# Patient Record
Sex: Female | Born: 1935
Health system: Southern US, Community
[De-identification: ages and names within clinical notes are randomized; demographics above are authoritative.]

## PROBLEM LIST (undated history)

## (undated) DIAGNOSIS — I499 Cardiac arrhythmia, unspecified: Secondary | ICD-10-CM

## (undated) DIAGNOSIS — F419 Anxiety disorder, unspecified: Secondary | ICD-10-CM

## (undated) DIAGNOSIS — E039 Hypothyroidism, unspecified: Secondary | ICD-10-CM

## (undated) DIAGNOSIS — I350 Nonrheumatic aortic (valve) stenosis: Secondary | ICD-10-CM

## (undated) DIAGNOSIS — F329 Major depressive disorder, single episode, unspecified: Secondary | ICD-10-CM

## (undated) DIAGNOSIS — F32A Depression, unspecified: Secondary | ICD-10-CM

## (undated) DIAGNOSIS — E785 Hyperlipidemia, unspecified: Secondary | ICD-10-CM

## (undated) DIAGNOSIS — I2510 Atherosclerotic heart disease of native coronary artery without angina pectoris: Secondary | ICD-10-CM

## (undated) DIAGNOSIS — M1990 Unspecified osteoarthritis, unspecified site: Secondary | ICD-10-CM

## (undated) HISTORY — PX: TOTAL HIP ARTHROPLASTY: SHX124

## (undated) HISTORY — PX: OTHER SURGICAL HISTORY: SHX169

## (undated) HISTORY — PX: REPLACEMENT TOTAL KNEE BILATERAL: SUR1225

## (undated) HISTORY — PX: TUBAL LIGATION: SHX77

## (undated) HISTORY — PX: PACEMAKER IMPLANT: EP1218

## (undated) HISTORY — DX: Atherosclerotic heart disease of native coronary artery without angina pectoris: I25.10

## (undated) HISTORY — DX: Nonrheumatic aortic (valve) stenosis: I35.0

---

## 1973-09-27 HISTORY — PX: BUNIONECTOMY: SHX129

## 1996-09-27 HISTORY — PX: TOTAL HIP ARTHROPLASTY: SHX124

## 2001-01-20 ENCOUNTER — Other Ambulatory Visit: Admission: RE | Admit: 2001-01-20 | Discharge: 2001-01-20 | Payer: Self-pay | Admitting: Obstetrics and Gynecology

## 2001-07-26 ENCOUNTER — Other Ambulatory Visit: Admission: RE | Admit: 2001-07-26 | Discharge: 2001-07-26 | Payer: Self-pay | Admitting: Obstetrics and Gynecology

## 2004-01-29 ENCOUNTER — Encounter: Payer: Self-pay | Admitting: Orthopedic Surgery

## 2004-01-29 ENCOUNTER — Ambulatory Visit (HOSPITAL_COMMUNITY): Admission: RE | Admit: 2004-01-29 | Discharge: 2004-01-29 | Payer: Self-pay | Admitting: General Surgery

## 2004-02-04 ENCOUNTER — Encounter: Payer: Self-pay | Admitting: Orthopedic Surgery

## 2007-09-28 HISTORY — PX: EYE SURGERY: SHX253

## 2007-12-21 ENCOUNTER — Ambulatory Visit: Payer: Self-pay | Admitting: Orthopedic Surgery

## 2007-12-21 DIAGNOSIS — M79609 Pain in unspecified limb: Secondary | ICD-10-CM | POA: Insufficient documentation

## 2009-07-03 ENCOUNTER — Ambulatory Visit (HOSPITAL_COMMUNITY): Admission: RE | Admit: 2009-07-03 | Discharge: 2009-07-03 | Payer: Self-pay | Admitting: Pulmonary Disease

## 2009-07-23 ENCOUNTER — Encounter (INDEPENDENT_AMBULATORY_CARE_PROVIDER_SITE_OTHER): Payer: Self-pay | Admitting: Interventional Radiology

## 2009-07-23 ENCOUNTER — Encounter: Admission: RE | Admit: 2009-07-23 | Discharge: 2009-07-23 | Payer: Self-pay | Admitting: Pulmonary Disease

## 2009-07-23 ENCOUNTER — Other Ambulatory Visit: Admission: RE | Admit: 2009-07-23 | Discharge: 2009-07-23 | Payer: Self-pay | Admitting: Interventional Radiology

## 2009-11-25 HISTORY — PX: TOTAL THYROIDECTOMY: SHX2547

## 2009-12-11 ENCOUNTER — Encounter (INDEPENDENT_AMBULATORY_CARE_PROVIDER_SITE_OTHER): Payer: Self-pay | Admitting: Otolaryngology

## 2009-12-11 ENCOUNTER — Ambulatory Visit (HOSPITAL_COMMUNITY): Admission: RE | Admit: 2009-12-11 | Discharge: 2009-12-13 | Payer: Self-pay | Admitting: Otolaryngology

## 2010-03-12 ENCOUNTER — Ambulatory Visit (HOSPITAL_COMMUNITY): Admission: RE | Admit: 2010-03-12 | Discharge: 2010-03-12 | Payer: Self-pay | Admitting: Pulmonary Disease

## 2010-05-13 ENCOUNTER — Encounter: Payer: Self-pay | Admitting: Orthopedic Surgery

## 2010-05-14 ENCOUNTER — Ambulatory Visit: Payer: Self-pay | Admitting: Orthopedic Surgery

## 2010-05-14 DIAGNOSIS — IMO0002 Reserved for concepts with insufficient information to code with codable children: Secondary | ICD-10-CM | POA: Insufficient documentation

## 2010-05-14 DIAGNOSIS — M1710 Unilateral primary osteoarthritis, unspecified knee: Secondary | ICD-10-CM

## 2010-05-19 ENCOUNTER — Encounter (INDEPENDENT_AMBULATORY_CARE_PROVIDER_SITE_OTHER): Payer: Self-pay | Admitting: *Deleted

## 2010-05-21 ENCOUNTER — Encounter (INDEPENDENT_AMBULATORY_CARE_PROVIDER_SITE_OTHER): Payer: Self-pay | Admitting: *Deleted

## 2010-05-28 HISTORY — PX: JOINT REPLACEMENT: SHX530

## 2010-06-02 ENCOUNTER — Ambulatory Visit: Payer: Self-pay | Admitting: Orthopedic Surgery

## 2010-06-02 ENCOUNTER — Inpatient Hospital Stay (HOSPITAL_COMMUNITY): Admission: RE | Admit: 2010-06-02 | Discharge: 2010-06-05 | Payer: Self-pay | Admitting: Orthopedic Surgery

## 2010-06-05 ENCOUNTER — Inpatient Hospital Stay: Admission: AD | Admit: 2010-06-05 | Discharge: 2010-06-19 | Payer: Self-pay | Admitting: Pulmonary Disease

## 2010-06-09 ENCOUNTER — Telehealth: Payer: Self-pay | Admitting: Orthopedic Surgery

## 2010-06-10 ENCOUNTER — Telehealth: Payer: Self-pay | Admitting: Orthopedic Surgery

## 2010-06-10 ENCOUNTER — Ambulatory Visit: Payer: Self-pay | Admitting: Orthopedic Surgery

## 2010-06-10 DIAGNOSIS — Z96659 Presence of unspecified artificial knee joint: Secondary | ICD-10-CM | POA: Insufficient documentation

## 2010-06-16 ENCOUNTER — Ambulatory Visit: Payer: Self-pay | Admitting: Orthopedic Surgery

## 2010-06-19 ENCOUNTER — Telehealth: Payer: Self-pay | Admitting: Orthopedic Surgery

## 2010-07-01 ENCOUNTER — Ambulatory Visit: Payer: Self-pay | Admitting: Orthopedic Surgery

## 2010-08-05 ENCOUNTER — Ambulatory Visit: Payer: Self-pay | Admitting: Orthopedic Surgery

## 2010-09-02 ENCOUNTER — Ambulatory Visit: Payer: Self-pay | Admitting: Orthopedic Surgery

## 2010-10-27 ENCOUNTER — Ambulatory Visit
Admission: RE | Admit: 2010-10-27 | Discharge: 2010-10-27 | Payer: Self-pay | Source: Home / Self Care | Attending: Orthopedic Surgery | Admitting: Orthopedic Surgery

## 2010-10-27 DIAGNOSIS — L0390 Cellulitis, unspecified: Secondary | ICD-10-CM | POA: Insufficient documentation

## 2010-10-27 DIAGNOSIS — L0291 Cutaneous abscess, unspecified: Secondary | ICD-10-CM | POA: Insufficient documentation

## 2010-10-29 NOTE — Assessment & Plan Note (Signed)
Summary: 2 WK RE-CK WOUND/LT TKA POST OP/MEDICARE/CAF   Visit Type:  Follow-up  CC:  POST op tka.  History of Present Illness: I saw [name] in the office today for a followup visit.  She is a 77 years old woman with the complaint of:  left total knee replacement.  DOS 06/02/10 Left total knee, Depuy system used.   Meds: Hydrocodone 7.5/325  takes 3 pills a day.  today we check her wound again she was on Cipro she's been off of it for about 3 days her incision looks good the redness on the lateral side of her leg has all but resolved  She has a PT report which reports her range of motion to be -10-100.  She cannot fully stand up because of her spinal stenosis she maintains a flexed posture in the back this is preventing her from reaching full extension as her other knee is deceased and flexed as well.  She is improving and bleeding with a rolling walker  Followup in one month.     Allergies: No Known Drug Allergies   Impression & Recommendations:  Problem # 1:  TOTAL KNEE FOLLOW-UP (ICD-V43.65) Assessment Comment Only  Orders: Post-Op Check (98119)  Problem # 2:  KNEE, ARTHRITIS, DEGEN./OSTEO (ICD-715.96) Assessment: Comment Only  Orders: Post-Op Check (14782)  Patient Instructions: 1)  Return in 1 month

## 2010-10-29 NOTE — Progress Notes (Signed)
Summary: call from [facility], appt request  Phone Note Call from Patient   Caller: Nursing Home Summary of Call: Rec'd call from [facility], nurse at [facility] @2 :47pm, relays that patient's LT  knee, s/p total knee replacement done 06/02/10,  showing infection.  States redness and 3+ pitting edema.   Advised to come in today, offered appointment for as soon as they can transport patient here today.   Initial call taken by: Cammie Sickle,  June 09, 2010 3:23 PM  Follow-up for Phone Call        Rec'd call back from [facility]. States they're unable to get transportation for patient to come this afternoon. Scheduled for tomorrow. [facility] ph  [phone] - nurse on 2nd shift is [place] if need to call there. Follow-up by: Cammie Sickle,  June 09, 2010 3:28 PM

## 2010-10-29 NOTE — Assessment & Plan Note (Signed)
Summary: 1 MO RECK WOUND/TKA/MCR/BSF   Visit Type:  Follow-up  CC:  post op TKA.  History of Present Illness: I saw [name] in the office today for a followup visit.  She is a 77 years old woman with the complaint of:  left total knee replacement.  DOS 06/02/10 Left total knee, Depuy system used.  medications Syntroid, Lasix, Paxil  Hydrocodone 7.5/325  takes 2 per day.; Cipro, taken for redness around her incision,  She's doing better. Her incision looks great. She is having physical therapy at home with a home exercise program.         Allergies: No Known Drug Allergies   Impression & Recommendations:  Problem # 1:  TOTAL KNEE FOLLOW-UP (ICD-V43.65) Assessment Improved  Orders: Post-Op Check (16109)  Problem # 2:  KNEE, ARTHRITIS, DEGEN./OSTEO (ICD-715.96) Assessment: Improved  Orders: Post-Op Check (60454)  Patient Instructions: 1)  Please schedule a follow-up appointment in 1 month.   Orders Added: 1)  Post-Op Check [09811]

## 2010-10-29 NOTE — Miscellaneous (Signed)
Summary: faxed notes to [facility] for surgery needs  Clinical Lists Changes

## 2010-10-29 NOTE — Progress Notes (Signed)
Summary: patient requests information to daughter about her visit  Phone Note Call from Patient   Caller: Patient Summary of Call: Following her appointment today, patient requests Dr Romeo Apple to relay to daughter, Madigan Rosensteel, what the findings were today with her knee. We have the appropriate auth on file. Ph [phone]. Initial call taken by: Cammie Sickle,  June 10, 2010 2:43 PM

## 2010-10-29 NOTE — Assessment & Plan Note (Signed)
Summary: 1 MTH RECK WOUND/TKA/MCR/CAF   Visit Type:  Follow-up  CC:  recheck knee TKA post op.  History of Present Illness: I saw [name] in the office today for a followup visit.  She is a 77 years old woman with the complaint of:  left total knee replacement.  DOS 06/02/10 Left total knee, Depuy system used.  POD 3 months.  medications Syntroid, Lasix, Paxil, Xanax.  Hydrocodone 7.5/325  takes 2 per day.  Today is a one month recheck after HEP.  ROM is better, doing great, she is doing pretty good. Right now, has a little lateral knee discomfort. Nothing major.  Her incision looks good. Her flexion is past 90 she has almost full extension. She is ambulating with a walker, that RIGHT knee is very arthritic and needs to be done. She wants to do that in June or July         Allergies: No Known Drug Allergies   Impression & Recommendations:  Problem # 1:  TOTAL KNEE FOLLOW-UP (ICD-V43.65) Assessment Improved  Orders: Post-Op Check (78469)  Patient Instructions: 1)  f/u June for left knee film and scheduling TKA left knee    Orders Added: 1)  Post-Op Check [62952]

## 2010-10-29 NOTE — Progress Notes (Signed)
Summary: call from nursing facility patient dischg   Phone Note Other Incoming   Caller: nurse Summary of Call: Clydie Braun, nurse at [facility] [phone], called to relay that patient requests going home today, due to no ins coverage after 20 days, and today is #20.  Dr Juanetta Gosling has okayed discharge.  Nurse is asking about sending patient home w/CPM - she will fax order. Initial call taken by: Cammie Sickle,  June 19, 2010 12:15 PM

## 2010-10-29 NOTE — Letter (Signed)
Summary: History form  History form   Imported By: Jacklynn Ganong 05/20/2010 08:37:28  _____________________________________________________________________  External Attachment:    Type:   Image     Comment:   External Document

## 2010-10-29 NOTE — Miscellaneous (Signed)
Summary: No pre-authorization req'd for in-patient surgery  Clinical Lists Changes  Insurer Medicare - patient is scheduled for in-patient surgery, LT total knee arthroplasty, CPT [phone], 06/02/10 at [facility].  No pre-auth required for Medicare

## 2010-10-29 NOTE — Assessment & Plan Note (Signed)
Summary: POST OP/1 wk RE-CK SKIN,TKA LT KNEE/MEDICARE/CAF   Visit Type:  post op  CC:  left total knee replacement.  History of Present Illness: I saw [name] in the office today for a followup visit.  She is a 77 years old woman with the complaint of:  left total knee replacement.  DOS 06/02/10 Left total knee, Depuy system used.   Meds: Coumadin 2mg , Oxycodone 5/325 1 by mouth q 4 hrs as needed pain, Oxycodone IR 5mg  q 3 hrs as needed pain for breakthrough. Cipro 500 Mg Tabs (Ciprofloxacin hcl) .Marland Kitchen.. 1 by mouth q day x 10 days  Ravinder is improving she said she walked 15 feet today.  Part of her staples have been removed.  She had a little subcutaneous bleeding and possible cellulitis and was placed on Cipro.  She appears to be improving.  Her wound looks good.  The lateral portion of her leg where there was a blister has resolved.  She does have some erythema at the distal portion lateral portion of the leg.  Recommend continue physical therapy finish the Cipro over the next 3 days  Follow up in 2 weeks to check the leg and in the wound.     Allergies: No Known Drug Allergies   Impression & Recommendations:  Problem # 1:  TOTAL KNEE FOLLOW-UP (ICD-V43.65)  Orders: Post-Op Check (45409)  Problem # 2:  KNEE, ARTHRITIS, DEGEN./OSTEO (ICD-715.96)  Orders: Post-Op Check (81191)  Patient Instructions: 1)  return in 2 weeks  2)  check wound

## 2010-10-29 NOTE — Letter (Signed)
Summary: surgery order LT total knee sched 06/02/10  surgery order LT total knee sched 06/02/10   Imported By: Cammie Sickle 05/18/2010 09:30:55  _____________________________________________________________________  External Attachment:    Type:   Image     Comment:   External Document

## 2010-10-29 NOTE — Assessment & Plan Note (Signed)
Summary: LEFT KNEE PAIN NEEDS XR/MEDICARE/BSF   Visit Type:  new problem  CC:  left knee pain.[name]  History of Present Illness: I saw [name] in the office today for a followup visit.  She is a 77 years old woman with the complaint of:  new problem, left knee pain.  Xrays today.  Medications: Synthroid 50 mg, Lasix 40 mg, Xanax .25 mg, Paxil 20 mg.  This is a 77 year old female who has multiple medical problems including hypothyroidism, status post total thyroidectomy, status post LEFT total hip replacement, status post tubal ligation.  She also takes Lasix, Xanax and Paxil and is fairly obese.  She presents with worsening pain over 7-8 years and presents with LEFT greater than RIGHT knee pain which she rates as a 10 out of 10.  When she is at rest she has no pain when she is standing she has significant pain and uses a rolling walker.  She ambulates very poorly with poor exercise tolerance and appears to have a grocery cart sign for lumbar spinal stenosis.  The sharp intermittent pain is worsened and she would like to have both of her knees replaced.   Allergies: No Known Drug Allergies  Past History:  Past Medical History: ostearthritis goiter depression constipation Thyroid  Past Surgical History: Thyroidectomy total hip replacement total reconstruction of left foot 1995 [facility]  Review of Systems Constitutional:  Complains of weight gain; denies weight loss, fever, chills, and fatigue. Cardiovascular:  Denies chest pain, palpitations, fainting, and murmurs. Respiratory:  Denies short of breath, wheezing, couch, tightness, pain on inspiration, and snoring . Gastrointestinal:  Complains of constipation; denies heartburn, nausea, vomiting, diarrhea, and blood in your stools. Genitourinary:  Complains of urgency; denies frequency, difficulty urinating, painful urination, flank pain, and bleeding in urine. Neurologic:  Denies numbness, tingling, unsteady gait,  dizziness, tremors, and seizure. Musculoskeletal:  Complains of joint pain; denies swelling, instability, stiffness, redness, heat, and muscle pain. Endocrine:  Denies excessive thirst, exessive urination, and heat or cold intolerance. Psychiatric:  Complains of depression; denies nervousness, anxiety, and hallucinations. Skin:  Complains of changes in the skin and itching; denies poor healing, rash, and redness. HEENT:  Denies blurred or double vision, eye pain, redness, and watering. Immunology:  Denies seasonal allergies, sinus problems, and allergic to bee stings. Hemoatologic:  Complains of brusing; denies easy bleeding.  Physical Exam  Additional Exam:   * VS reviewed and were normal   *GEN: appearance she is significantly obese.  Her grooming and hygiene are normal shows no deformities other than the knees  ** CDV: normal pulses temperature and no edema  * LYMPH nodes were normal   * SKIN was normal in her extremities  * Neuro: normal sensation in her extremities ** Psyche: AAO x 3 and mood was normal   MSK *Gait as described in history she has a flexed posture she has bilateral flexion contractures of her knees she walks with a significant band in her back bilateral knee she has significant flexion contractures we measured the LEFT knee 25-82 in the RIGHT knee similar.  She had tenderness in both joint lines.  She had deformity.  Motor exam was normal.  Knees were stable.  Upper extremities no deformity, no contracture, no subluxation atrophy or tremor    Impression & Recommendations:  Problem # 1:  KNEE, ARTHRITIS, DEGEN./OSTEO (ICD-715.96) Assessment New x-rays were taken of the LEFT knee and she has complete joint space narrowing in both  joints with varus malalignment severe.  There are multiple osteophytes and severe deformity  Impression severe osteoarthritis LEFT knee   I had a long conversation with this patient and addressed several problems which are of  potentially related to her specific disease process.  She will have to have rehabilitation at a rehabilitation hospital, she is in severe edema in her legs and venous stasis which can lead to blood clots wound problems and ulceration.  She is significantly weak and exercise tolerance is poor and she will have a difficult rehabilitation course.  She also has significant disease in her RIGHT knee and significant stiffness preoperatively in the LEFT knee this can all be to stiffness postoperatively and poor rehabilitation.  She understands these concerns and wishes to proceed with the surgery with the other inherent risks which were also discussed.   Informed consent process: I have discussed the procedure with the patient. I have answered their questions. The risks of bleeding, infection, nerve and vascualr injury have been discussed. The diagnosis and reason for surgery have been explained. The patient demonstrates understanding of this discussion. Specific to this procedure risks include:  Stiffness Pain Infection-can lead to further surgery and amputation  We will go ahead with LEFT total knee arthroplasty  Orders: New Patient Level V (64403) Knee x-ray,  3 views (47425)  Patient Instructions: 1)  DOS 06/02/10 at [facility] 2)  I will call you with your pre[facility], take packet with you 3)  post op 1 in our office on 05/2010 4)  stop aspirin or coumadin 1 week before surgery

## 2010-10-29 NOTE — Assessment & Plan Note (Signed)
Summary: POST OP/?INFECTION/TKA SURG 06/02/10/MEDICARE/CAF   Visit Type:  Follow-up  CC:  post op knee tka.  History of Present Illness: I saw [name] in the office today for a postoperative visit.  She is 77 years old she is at the [facility] center she had a LEFT total knee replacement this is postop a #8  DOS 06/02/10 Left total knee, Depuy system used.     Meds: Coumadin 2mg , Oxycodone 5/325 1 by mouth q 4 hrs as needed pain, Oxycodone IR 5mg  q 3 hrs as needed pain for breakthrough.  Her pain is controlled on the medication.  She was advised to come in yesterday because we received a phone call that her incision looked red and she had some blisters.  Note she had blisters prior to discharge and we released those.    Examination: There are 2 blisters one laterally and one medially there is erythema on the lateral side of the wound.  She does a lot of swelling and subcutaneous ecchymosis her from the thigh to the leg and there is pitting edema.  Recommend stopping Coumadin.  He blisters with wound care.  I took some of the staples out to relieve some of the skin edge tension.  Staples should still come out on the 20th as scheduled.  Followup one week.  Start Cipro 500 mg daily.  Allergies: No Known Drug Allergies   Impression & Recommendations:  Problem # 1:  KNEE, ARTHRITIS, DEGEN./OSTEO (ICD-715.96)  Orders: Post-Op Check (16109)  Problem # 2:  TOTAL KNEE FOLLOW-UP (ICD-V43.65)  Orders: Post-Op Check (60454)  Medications Added to Medication List This Visit: 1)  Cipro 500 Mg Tabs (Ciprofloxacin hcl) .Marland Kitchen.. 1 by mouth q day x 10 days  Patient Instructions: 1)  Stop Coumadin 2)  start cipro 500 mg daily  3)  return 1 week check skin  Prescriptions: CIPRO 500 MG TABS (CIPROFLOXACIN HCL) 1 by mouth q day x 10 days  #10 x 1   Entered and Authorized by:   Fuller Canada MD   Signed by:   Fuller Canada MD on 06/10/2010   Method used:   Print then Give to Patient  RxID:   [id number]

## 2010-11-04 NOTE — Assessment & Plan Note (Signed)
Summary: 2 red knots at incision/mcr/bsf   Visit Type:  post op  CC:  left knee replacement.  History of Present Illness: I saw [name] in the office today for a followup visit.  She is a 77 years old woman with the complaint of:  left knee replacement  DOS 06/02/10 Left total knee, Depuy system used.  medications Syntroid, Lasix, Paxil, Xanax.  Hydrocodone 7.5/325  takes 2 per day.  Complaints: She has some redness on her leg, no injury, no warmth for 1 week. NOT NEAR THE INCISION ITS ON THE LOWER LEG   She has 2 areas of concern on her lower legs, which are not near her knee incision. #1 lesion is red slightly warm and tender to touch. There is a 2nd lesion, which she says has actually gone down.  Her knee looks fine.    Allergies: No Known Drug Allergies  Review of Systems Constitutional:  Denies fever and chills.   Impression & Recommendations:  Problem # 1:  ABSCESS, SKIN (ICD-682.9) Assessment New  Orders: Est. Patient Level III (62952)  Medications Added to Medication List This Visit: 1)  Doxycycline Hyclate 100 Mg Caps (Doxycycline hyclate) .Marland Kitchen.. 1 by mouth two times a day  Patient Instructions: 1)  start new medication and take for 2 weeks  2)  if not better call us for new appointment  Prescriptions: DOXYCYCLINE HYCLATE 100 MG CAPS (DOXYCYCLINE HYCLATE) 1 by mouth two times a day  #28 x 1   Entered and Authorized by:   Fuller Canada MD   Signed by:   Fuller Canada MD on 10/27/2010   Method used:   Print then Give to Patient   RxID:   [id number]    Orders Added: 1)  Est. Patient Level III [64403]

## 2010-12-02 ENCOUNTER — Telehealth: Payer: Self-pay | Admitting: Orthopedic Surgery

## 2010-12-02 ENCOUNTER — Ambulatory Visit: Payer: Self-pay | Admitting: Orthopedic Surgery

## 2010-12-03 ENCOUNTER — Ambulatory Visit: Payer: Self-pay | Admitting: Orthopedic Surgery

## 2010-12-08 NOTE — Progress Notes (Signed)
Summary: patient cancelled her appointment  Phone Note Call from Patient   Summary of Call: [name] called yesterday and said she needed to be seen for the knot at her wound site.  York Spaniel you had told her to come back if it returned.  She was schedueled for 12/02/10 but called 12/02/10 and said she in much better and does not need to come in.

## 2010-12-10 LAB — CBC
HCT: 25.3 % — ABNORMAL LOW (ref 36.0–46.0)
HCT: 29.6 % — ABNORMAL LOW (ref 36.0–46.0)
HCT: 34.1 % — ABNORMAL LOW (ref 36.0–46.0)
HCT: 42.1 % (ref 36.0–46.0)
Hemoglobin: 8.5 g/dL — ABNORMAL LOW (ref 12.0–15.0)
MCH: 28.8 pg (ref 26.0–34.0)
MCH: 29.4 pg (ref 26.0–34.0)
MCH: 29.5 pg (ref 26.0–34.0)
MCHC: 34 g/dL (ref 30.0–36.0)
MCV: 86.4 fL (ref 78.0–100.0)
MCV: 87.4 fL (ref 78.0–100.0)
MCV: 87.7 fL (ref 78.0–100.0)
Platelets: 258 10*3/uL (ref 150–400)
RBC: 3.95 MIL/uL (ref 3.87–5.11)
RBC: 4.82 MIL/uL (ref 3.87–5.11)
RDW: 13.6 % (ref 11.5–15.5)
RDW: 13.6 % (ref 11.5–15.5)
RDW: 13.7 % (ref 11.5–15.5)
RDW: 13.7 % (ref 11.5–15.5)
WBC: 10.9 10*3/uL — ABNORMAL HIGH (ref 4.0–10.5)
WBC: 11.1 10*3/uL — ABNORMAL HIGH (ref 4.0–10.5)
WBC: 9.5 10*3/uL (ref 4.0–10.5)

## 2010-12-10 LAB — DIFFERENTIAL
Basophils Absolute: 0 10*3/uL (ref 0.0–0.1)
Basophils Absolute: 0 10*3/uL (ref 0.0–0.1)
Basophils Absolute: 0 10*3/uL (ref 0.0–0.1)
Basophils Absolute: 0.1 10*3/uL (ref 0.0–0.1)
Basophils Relative: 0 % (ref 0–1)
Basophils Relative: 0 % (ref 0–1)
Eosinophils Absolute: 0 10*3/uL (ref 0.0–0.7)
Eosinophils Absolute: 0 10*3/uL (ref 0.0–0.7)
Eosinophils Relative: 0 % (ref 0–5)
Eosinophils Relative: 4 % (ref 0–5)
Lymphocytes Relative: 13 % (ref 12–46)
Lymphocytes Relative: 29 % (ref 12–46)
Lymphocytes Relative: 9 % — ABNORMAL LOW (ref 12–46)
Lymphs Abs: 1.2 10*3/uL (ref 0.7–4.0)
Lymphs Abs: 2.3 10*3/uL (ref 0.7–4.0)
Monocytes Absolute: 0.9 10*3/uL (ref 0.1–1.0)
Monocytes Absolute: 1.2 10*3/uL — ABNORMAL HIGH (ref 0.1–1.0)
Monocytes Absolute: 1.3 10*3/uL — ABNORMAL HIGH (ref 0.1–1.0)
Monocytes Relative: 10 % (ref 3–12)
Monocytes Relative: 7 % (ref 3–12)
Neutro Abs: 7 10*3/uL (ref 1.7–7.7)
Neutro Abs: 8.3 10*3/uL — ABNORMAL HIGH (ref 1.7–7.7)
Neutrophils Relative %: 62 % (ref 43–77)

## 2010-12-10 LAB — CROSSMATCH
ABO/RH(D): O POS
Antibody Screen: NEGATIVE

## 2010-12-10 LAB — BASIC METABOLIC PANEL
BUN: 19 mg/dL (ref 6–23)
CO2: 30 mEq/L (ref 19–32)
Calcium: 7.9 mg/dL — ABNORMAL LOW (ref 8.4–10.5)
Calcium: 8.4 mg/dL (ref 8.4–10.5)
Chloride: 102 mEq/L (ref 96–112)
Chloride: 106 mEq/L (ref 96–112)
Creatinine, Ser: 0.78 mg/dL (ref 0.4–1.2)
GFR calc Af Amer: 59 mL/min — ABNORMAL LOW (ref >60)
GFR calc Af Amer: >60 mL/min (ref >60)
GFR calc non Af Amer: 49 mL/min — ABNORMAL LOW (ref >60)
GFR calc non Af Amer: 56 mL/min — ABNORMAL LOW (ref >60)
GFR calc non Af Amer: >60 mL/min (ref >60)
Potassium: 3.8 mEq/L (ref 3.5–5.1)
Potassium: 4 mEq/L (ref 3.5–5.1)
Sodium: 138 mEq/L (ref 135–145)
Sodium: 140 mEq/L (ref 135–145)

## 2010-12-10 LAB — PROTIME-INR
INR: 0.92 (ref 0.00–1.49)
INR: 1.11 (ref 0.00–1.49)
Prothrombin Time: 14.5 seconds (ref 11.6–15.2)
Prothrombin Time: 20 seconds — ABNORMAL HIGH (ref 11.6–15.2)

## 2010-12-10 LAB — SURGICAL PCR SCREEN: Staphylococcus aureus: POSITIVE — AB

## 2010-12-10 LAB — APTT: aPTT: 35 seconds (ref 24–37)

## 2010-12-21 LAB — CBC
HCT: 42.7 % (ref 36.0–46.0)
MCHC: 33.5 g/dL (ref 30.0–36.0)
MCV: 89.3 fL (ref 78.0–100.0)
Platelets: 254 10*3/uL (ref 150–400)
RDW: 13.2 % (ref 11.5–15.5)

## 2010-12-21 LAB — BASIC METABOLIC PANEL
BUN: 16 mg/dL (ref 6–23)
CO2: 26 mEq/L (ref 19–32)
Chloride: 104 mEq/L (ref 96–112)
Creatinine, Ser: 0.87 mg/dL (ref 0.4–1.2)
Glucose, Bld: 106 mg/dL — ABNORMAL HIGH (ref 70–99)
Potassium: 4 mEq/L (ref 3.5–5.1)

## 2010-12-21 LAB — CALCIUM
Calcium: 7.9 mg/dL — ABNORMAL LOW (ref 8.4–10.5)
Calcium: 8.2 mg/dL — ABNORMAL LOW (ref 8.4–10.5)
Calcium: 8.3 mg/dL — ABNORMAL LOW (ref 8.4–10.5)

## 2011-03-16 ENCOUNTER — Ambulatory Visit (INDEPENDENT_AMBULATORY_CARE_PROVIDER_SITE_OTHER): Payer: Medicare Other | Admitting: Orthopedic Surgery

## 2011-03-16 DIAGNOSIS — M1710 Unilateral primary osteoarthritis, unspecified knee: Secondary | ICD-10-CM

## 2011-03-16 DIAGNOSIS — IMO0002 Reserved for concepts with insufficient information to code with codable children: Secondary | ICD-10-CM

## 2011-03-16 NOTE — Progress Notes (Signed)
RIGHT knee pain  The patient is in today for preoperative visit regarding her RIGHT knee.  She is 77 years old she had a LEFT total knee arthroplasty last year did well like to have the RIGHT knee done.  She has medical problems including hypothyroidism status post total thyroidectomy, status post LEFT total hip replacement, status post tubal ligation.  Status post LEFT total knee arthroplasty.  She is on Synthroid Lasix Xanax and Paxil.  She has obesity.  She has NO KNOWN DRUG ALLERGIES she does have a history of some constipation and depression she has lumbar disc disease.  Her review of systems is significant for recent weight loss 47 pounds, purposely done to help with her knee symptoms  Denies chest pain shortness of breath complains of urgency at times denies frequency or difficulty urinating does have a history of constipation.  Denies numbness tingling or unsteady gait.  Complains of joint pain denies swelling instability or stiffness denies excessive thirst.  Denies blurred vision denies skin changes denies ALLERGIES.  She does note easy bruising.  She has a painful stiff RIGHT knee and cannot tolerate it bent for several minutes.  She has lost 45 of extension.  She has approximately 105 of knee flexion.  She has varicose veins.  Overall she is significantly obese although she has lost significant amount of weight.  Her grooming and hygiene are normal.  Cardiovascular exam shows normal pulses and no edema normal temperature but varicose veins.  Skin is not read are swollen.  Lymph nodes are normal.  Skin is normal.  Neurologic exam normal sensation in her extremities and she is awake alert nor did x3 mood and affect are normal.  She has a significantly flexed posture as she ambulates with a walker and a severe flexion contracture in the RIGHT knee.  Knee flexion on the RIGHT is 105 with a 45 flexion contracture.  She has crepitance and pain to palpation along the joint lines.  The knee  feels stable.  Muscle tone is normal strength appears to be normal.  Her LEFT knee is functioning well with her range of motion of 105 of flexion.  Osteoarthritis RIGHT knee  RIGHT total knee arthroplasty RIGHT knee x-ray shows 11 of valgus deformity with medial and lateral joint space loss multiple osteophytes and a slightly high riding patella the patellofemoral joint has complete medial and lateral facet chondrosis   As noted prior to the last surgery the patient is aware of her risks which are slightly higher than normal specifically related to deep vein thrombosis and infection secondary to her varicose veins and her obesity  She wishes to proceed with surgical treatment vs. Continued nonoperative care

## 2011-03-16 NOTE — Progress Notes (Signed)
X-ray report  AP lateral and tangential view RIGHT knee  Mediolateral joint space narrowing, 11 of valgus deformity.  Multiple osteophytes.  Slightly high riding patella.  Complete joint space loss patellofemoral joint.  Impression osteoarthritis RIGHT knee with mild to moderate valgus and severe joint space loss

## 2011-03-16 NOTE — Progress Notes (Signed)
X-ray report.  AP, lateral, and tangential view of RIGHT patella.  Overall alignment is valgus. Moderate. Medial and lateral joint spaces are completely compromised. There are multiple osteophytes. The patella appears to be riding high. There is complete loss of joint space in the patellofemoral joint.  Impression severe osteoarthritic changes of moderate valgus malalignment.

## 2011-04-06 ENCOUNTER — Ambulatory Visit (HOSPITAL_COMMUNITY)
Admission: RE | Admit: 2011-04-06 | Discharge: 2011-04-06 | Disposition: A | Discharge status: Home / Self Care | Payer: Medicare Other | Source: Ambulatory Visit | Attending: Pulmonary Disease | Admitting: Pulmonary Disease

## 2011-04-06 ENCOUNTER — Other Ambulatory Visit (HOSPITAL_COMMUNITY): Payer: Self-pay | Admitting: Pulmonary Disease

## 2011-04-06 DIAGNOSIS — M25519 Pain in unspecified shoulder: Secondary | ICD-10-CM | POA: Insufficient documentation

## 2011-04-06 DIAGNOSIS — M898X9 Other specified disorders of bone, unspecified site: Secondary | ICD-10-CM | POA: Insufficient documentation

## 2011-04-06 DIAGNOSIS — R079 Chest pain, unspecified: Secondary | ICD-10-CM | POA: Insufficient documentation

## 2011-04-08 ENCOUNTER — Telehealth: Payer: Self-pay | Admitting: Orthopedic Surgery

## 2011-04-08 NOTE — Telephone Encounter (Signed)
Per Medicare guidelines, no pre-authorization required for surgery, scheduled as in-patient admission, CPT [phone], Right total knee arthroplasty at [facility] on 05/03/11.

## 2011-04-23 ENCOUNTER — Telehealth: Payer: Self-pay | Admitting: Orthopedic Surgery

## 2011-04-23 NOTE — Telephone Encounter (Signed)
No pre-authorization required in-patient surgery per Medicare guidelines.  In-patient procedure CPT [phone] Total knee arthroplasty RT knee scheduled 05/03/11 at [facility].

## 2011-04-26 ENCOUNTER — Telehealth: Payer: Self-pay | Admitting: *Deleted

## 2011-04-26 NOTE — Telephone Encounter (Signed)
Faxed info to [facility] and Genev[facility] Norlander for assistance after TKA

## 2011-04-28 ENCOUNTER — Other Ambulatory Visit: Payer: Self-pay

## 2011-04-28 ENCOUNTER — Encounter (HOSPITAL_COMMUNITY): Payer: Self-pay

## 2011-04-28 ENCOUNTER — Other Ambulatory Visit: Payer: Self-pay | Admitting: Orthopedic Surgery

## 2011-04-28 ENCOUNTER — Encounter (HOSPITAL_COMMUNITY)
Admission: RE | Admit: 2011-04-28 | Discharge: 2011-04-28 | Disposition: A | Discharge status: Home / Self Care | Payer: Medicare Other | Source: Ambulatory Visit | Attending: Orthopedic Surgery | Admitting: Orthopedic Surgery

## 2011-04-28 HISTORY — DX: Unspecified osteoarthritis, unspecified site: M19.90

## 2011-04-28 HISTORY — DX: Hyperlipidemia, unspecified: E78.5

## 2011-04-28 HISTORY — DX: Depression, unspecified: F32.A

## 2011-04-28 HISTORY — DX: Hypothyroidism, unspecified: E03.9

## 2011-04-28 HISTORY — DX: Major depressive disorder, single episode, unspecified: F32.9

## 2011-04-28 HISTORY — DX: Anxiety disorder, unspecified: F41.9

## 2011-04-28 LAB — CBC
HCT: 41.5 % (ref 36.0–46.0)
MCV: 88.1 fL (ref 78.0–100.0)
Platelets: 266 10*3/uL (ref 150–400)
RBC: 4.71 MIL/uL (ref 3.87–5.11)
WBC: 7.6 10*3/uL (ref 4.0–10.5)

## 2011-04-28 LAB — BASIC METABOLIC PANEL
CO2: 25 mEq/L (ref 19–32)
Calcium: 9.5 mg/dL (ref 8.4–10.5)
Glucose, Bld: 93 mg/dL (ref 70–99)
Sodium: 140 mEq/L (ref 135–145)

## 2011-04-28 LAB — PROTIME-INR: INR: 0.91 (ref 0.00–1.49)

## 2011-04-28 LAB — DIFFERENTIAL
Eosinophils Relative: 2 % (ref 0–5)
Lymphocytes Relative: 35 % (ref 12–46)
Lymphs Abs: 2.7 10*3/uL (ref 0.7–4.0)
Monocytes Absolute: 0.5 10*3/uL (ref 0.1–1.0)

## 2011-04-28 LAB — PREPARE RBC (CROSSMATCH)

## 2011-04-28 LAB — APTT: aPTT: 37 seconds (ref 24–37)

## 2011-04-28 MED ORDER — LACTATED RINGERS IV SOLN: INTRAVENOUS | Status: DC

## 2011-04-28 NOTE — Patient Instructions (Signed)
20 [name]  04/28/2011   Your procedure is scheduled on: Monday, 05/03/11  [facility] to [facility] [place] [facility].  Call this number if you have problems the morning of surgery: [phone]   Remember:   Do not eat food:After Midnight.  Do not drink clear liquids: After Midnight.  Take these medicines the morning of surgery with A SIP OF WATER: xanax, paxil, synthroid   Do not wear jewelry, make-up or nail polish.  Do not wear lotions, powders, or perfumes. You may wear deodorant.  Do not shave 48 hours prior to surgery.  Do not bring valuables to the hospital.  Contacts, dentures or bridgework may not be worn into surgery.  Leave suitcase in the car. After surgery it may be brought to your room.  For patients admitted to the hospital, checkout time is 11:00 AM the day of discharge.   Patients discharged the day of surgery will not be allowed to drive home.  Name and phone number of your driver: family  Special Instructions: CHG Shower Use Special Wash: 1/2 bottle night before surgery and 1/2 bottle morning of surgery.   Please read over the following fact sheets that you were given: Pain Booklet, Blood Transfusion Information, MRSA Information, Surgical Site Infection Prevention, Anesthesia Post-op Instructions and Care and Recovery After Surgery   PATIENT INSTRUCTIONS POST-ANESTHESIA  IMMEDIATELY FOLLOWING SURGERY:  Do not drive or operate machinery for the first twenty four hours after surgery.  Do not make any important decisions for twenty four hours after surgery or while taking narcotic pain medications or sedatives.  If you develop intractable nausea and vomiting or a severe headache please notify your doctor immediately.  FOLLOW-UP:  Please make an appointment with your surgeon as instructed. You do not need to follow up with anesthesia unless specifically instructed to do so.  WOUND CARE INSTRUCTIONS (if applicable):  Keep a dry clean dressing on the anesthesia/puncture  wound site if there is drainage.  Once the wound has quit draining you may leave it open to air.  Generally you should leave the bandage intact for twenty four hours unless there is drainage.  If the epidural site drains for more than 36-48 hours please call the anesthesia department.  QUESTIONS?:  Please feel free to call your physician or the hospital operator if you have any questions, and they will be happy to assist you.     [facility] Anesthesia Department [address] [place] [phone]

## 2011-04-28 NOTE — H&P (Signed)
[name] is an 78 y.o. female.   Chief Complaint: right knee pain  HPI: 78 female with h/o left TKA dis well now presents with C. Disabling RIGHT knee pain, which is interfering with her activities of daily living. The pain is non-radiating confined to the RIGHT knee joint associated with swelling, catching, locking, and giving out.  Review of systems  Past Medical History  Diagnosis Date  . Hyperlipidemia   . Hypothyroidism   . Anxiety   . Depression   . Arthritis   . Chronic bronchitis   . Blood transfusion   . Constipation     Past Surgical History  Procedure Date  . Tubal ligation   . Joint replacement 05/2010    left total knee Dr. Josedejesus Marcum  . Total thyroidectomy March 2011    Dr. Teoh @ [facility]  . Left hip replacement @ [facility] '98   . Left total knee     Family History  Problem Relation Age of Onset  . Anesthesia problems Neg Hx   . Hypotension Neg Hx   . Malignant hyperthermia Neg Hx   . Pseudochol deficiency Neg Hx    Social History:  reports that she has never smoked. She does not have any smokeless tobacco history on file. She reports that she does not drink alcohol or use illicit drugs.  Allergies: No Known Allergies  Medications Prior to Admission  Medication Sig Dispense Refill  . ALPRAZolam (XANAX) 0.25 MG tablet Take 0.25 mg by mouth at bedtime as needed. For anxiety      . clotrimazole (LOTRIMIN) 1 % cream Apply topically 2 (two) times daily between meals as needed. For rash       . furosemide (LASIX) 40 MG tablet       . HYDROcodone-acetaminophen (NORCO) 10-325 MG per tablet 1 tablet every 4 (four) hours as needed. For pain      . ibuprofen (ADVIL,MOTRIN) 200 MG tablet Take 200 mg by mouth every 6 (six) hours as needed. For pain       . levothyroxine (SYNTHROID, LEVOTHROID) 150 MCG tablet       . PARoxetine (PAXIL) 20 MG tablet        Medications Prior to Admission  Medication Dose Route Frequency Provider Last Rate Last Dose  . lactated  ringers infusion   Intravenous Continuous Luis Gonzalez        No results found for this or any previous visit (from the past 48 hour(s)). @RISRSLT48@  Review of Systems  Constitutional: Negative.   HENT: Negative.   Eyes: Negative.   Respiratory: Positive for shortness of breath.   Cardiovascular: Positive for orthopnea.  Gastrointestinal: Negative.   Genitourinary: Negative.   Musculoskeletal: Positive for back pain and joint pain.  Skin:       Venous stasis  Neurological: Negative.   Endo/Heme/Allergies: Negative.   Psychiatric/Behavioral: Negative.     There were no vitals taken for this visit. Physical Exam  Vital signs are stable as recorded  General appearance is normal  The patient is alert and oriented x3  [name] mood and affect are normal  Gait assessment: walker hunched over The cardiovascular exam reveals normal pulses and temperature without edema swelling.  The lymphatic system is negative for palpable lymph nodes  The sensory exam is normal.  There are no pathologic reflexes.  Balance is questionable   Exam of the right knee Inspection valgus alignment Range of motion 110 Stability normal collaterals; and cruciates  Strength good   Skin normal   Upper extremity exam  Inspection and palpation revealed no abnormalities in the upper extremities.  Range of motion is full without contracture.  Motor exam is normal with grade 5 strength.  The joints are fully reduced without subluxation.  There is no atrophy or tremor and muscle tone is normal.  All joints are stable.  Left knee: aligned normal / skin incision normal / 115 ROM / motor grade 5 / stability normal   X-rays valgus, OA lateral compartment, severe; osteophytes    Assessment/Plan OA RIGHT KNEE   RT TKA/ DEPUY  Clarisse Rodriges 04/28/2011, 11:34 AM    

## 2011-05-03 ENCOUNTER — Encounter (HOSPITAL_COMMUNITY)
Admission: RE | Disposition: A | Discharge status: Skilled Nursing Facility | Payer: Self-pay | Source: Ambulatory Visit | Attending: Orthopedic Surgery

## 2011-05-03 ENCOUNTER — Inpatient Hospital Stay (HOSPITAL_COMMUNITY): Payer: Medicare Other

## 2011-05-03 ENCOUNTER — Encounter (HOSPITAL_COMMUNITY): Payer: Self-pay | Admitting: Anesthesiology

## 2011-05-03 ENCOUNTER — Inpatient Hospital Stay (HOSPITAL_COMMUNITY)
Admission: RE | Admit: 2011-05-03 | Discharge: 2011-05-06 | DRG: 470 | Disposition: A | Discharge status: Skilled Nursing Facility | Payer: Medicare Other | Source: Ambulatory Visit | Attending: Orthopedic Surgery | Admitting: Orthopedic Surgery

## 2011-05-03 ENCOUNTER — Encounter (HOSPITAL_COMMUNITY): Payer: Self-pay | Admitting: *Deleted

## 2011-05-03 ENCOUNTER — Other Ambulatory Visit: Payer: Self-pay

## 2011-05-03 ENCOUNTER — Inpatient Hospital Stay (HOSPITAL_COMMUNITY): Payer: Medicare Other | Admitting: Anesthesiology

## 2011-05-03 DIAGNOSIS — F411 Generalized anxiety disorder: Secondary | ICD-10-CM | POA: Diagnosis present

## 2011-05-03 DIAGNOSIS — F329 Major depressive disorder, single episode, unspecified: Secondary | ICD-10-CM | POA: Diagnosis present

## 2011-05-03 DIAGNOSIS — J42 Unspecified chronic bronchitis: Secondary | ICD-10-CM | POA: Diagnosis present

## 2011-05-03 DIAGNOSIS — Y831 Surgical operation with implant of artificial internal device as the cause of abnormal reaction of the patient, or of later complication, without mention of misadventure at the time of the procedure: Secondary | ICD-10-CM | POA: Diagnosis not present

## 2011-05-03 DIAGNOSIS — E039 Hypothyroidism, unspecified: Secondary | ICD-10-CM | POA: Diagnosis present

## 2011-05-03 DIAGNOSIS — F3289 Other specified depressive episodes: Secondary | ICD-10-CM | POA: Diagnosis present

## 2011-05-03 DIAGNOSIS — M1710 Unilateral primary osteoarthritis, unspecified knee: Principal | ICD-10-CM | POA: Diagnosis present

## 2011-05-03 DIAGNOSIS — IMO0002 Reserved for concepts with insufficient information to code with codable children: Principal | ICD-10-CM | POA: Diagnosis present

## 2011-05-03 HISTORY — PX: TOTAL KNEE ARTHROPLASTY: SHX125

## 2011-05-03 SURGERY — ARTHROPLASTY, KNEE, TOTAL
Anesthesia: Choice | Site: Knee | Laterality: Right | Wound class: Clean

## 2011-05-03 MED ORDER — ONDANSETRON HCL 4 MG/2ML IJ SOLN
4.0000 mg | Freq: Once | INTRAMUSCULAR | Status: AC | PRN
  Administered 2011-05-03: 4 mg via INTRAVENOUS

## 2011-05-03 MED ORDER — BUPIVACAINE HCL 0.75 % IJ SOLN
INTRAMUSCULAR | Status: DC | PRN
  Administered 2011-05-03: 15 mg

## 2011-05-03 MED ORDER — LACTATED RINGERS IV SOLN
INTRAVENOUS | Status: AC
  Filled 2011-05-03: qty 1000

## 2011-05-03 MED ORDER — FUROSEMIDE 40 MG PO TABS
40.0000 mg | ORAL_TABLET | Freq: Every day | ORAL | Status: DC
Start: 2011-05-04 — End: 2011-05-06
  Administered 2011-05-04 – 2011-05-06 (×3): 40 mg via ORAL
  Filled 2011-05-03 (×3): qty 1

## 2011-05-03 MED ORDER — LACTATED RINGERS IV SOLN
INTRAVENOUS | Status: DC
  Administered 2011-05-03: 09:00:00 via INTRAVENOUS
  Administered 2011-05-03: 1000 mL via INTRAVENOUS

## 2011-05-03 MED ORDER — ACETAMINOPHEN 325 MG PO TABS: 650.0000 mg | ORAL_TABLET | Freq: Four times a day (QID) | ORAL | Status: DC | PRN

## 2011-05-03 MED ORDER — OXYCODONE HCL 5 MG PO TABS
5.0000 mg | ORAL_TABLET | ORAL | Status: DC | PRN
  Filled 2011-05-03: qty 1

## 2011-05-03 MED ORDER — LIDOCAINE HCL 1 % IJ SOLN
INTRAMUSCULAR | Status: DC | PRN
  Administered 2011-05-03 (×3): 10 mg via INTRADERMAL

## 2011-05-03 MED ORDER — HYDROMORPHONE HCL 1 MG/ML IJ SOLN
0.5000 mg | INTRAMUSCULAR | Status: DC | PRN
  Administered 2011-05-03: 0.5 mg via INTRAVENOUS
  Administered 2011-05-03 – 2011-05-05 (×11): 1 mg via INTRAVENOUS
  Filled 2011-05-03 (×12): qty 1

## 2011-05-03 MED ORDER — BUPIVACAINE HCL (PF) 0.25 % IJ SOLN
INTRAMUSCULAR | Status: DC | PRN
  Administered 2011-05-03: 10:00:00

## 2011-05-03 MED ORDER — CHLORHEXIDINE GLUCONATE 4 % EX LIQD
60.0000 mL | Freq: Once | CUTANEOUS | Status: DC
  Filled 2011-05-03: qty 118

## 2011-05-03 MED ORDER — METHOCARBAMOL 100 MG/ML IJ SOLN
500.0000 mg | Freq: Four times a day (QID) | INTRAVENOUS | Status: DC | PRN
  Filled 2011-05-03: qty 5

## 2011-05-03 MED ORDER — BUPIVACAINE HCL (PF) 0.25 % IJ SOLN
INTRAMUSCULAR | Status: AC
  Filled 2011-05-03: qty 240

## 2011-05-03 MED ORDER — FENTANYL CITRATE 0.05 MG/ML IJ SOLN
INTRAMUSCULAR | Status: AC
  Administered 2011-05-03: 50 ug via INTRAVENOUS
  Filled 2011-05-03: qty 2

## 2011-05-03 MED ORDER — FENTANYL CITRATE 0.05 MG/ML IJ SOLN
25.0000 ug | INTRAMUSCULAR | Status: DC | PRN
  Administered 2011-05-03 (×2): 50 ug via INTRAVENOUS

## 2011-05-03 MED ORDER — ALPRAZOLAM 0.25 MG PO TABS
0.2500 mg | ORAL_TABLET | Freq: Every evening | ORAL | Status: DC | PRN
  Administered 2011-05-03: 0.25 mg via ORAL
  Filled 2011-05-03: qty 1

## 2011-05-03 MED ORDER — BISACODYL 5 MG PO TBEC
10.0000 mg | DELAYED_RELEASE_TABLET | Freq: Every day | ORAL | Status: DC | PRN
Start: 2011-05-03 — End: 2011-05-06

## 2011-05-03 MED ORDER — PAROXETINE HCL 20 MG PO TABS
20.0000 mg | ORAL_TABLET | Freq: Every day | ORAL | Status: DC
Start: 2011-05-04 — End: 2011-05-06
  Administered 2011-05-04 – 2011-05-06 (×3): 20 mg via ORAL
  Filled 2011-05-03 (×3): qty 1

## 2011-05-03 MED ORDER — POLYETHYLENE GLYCOL 3350 17 G PO PACK: 17.0000 g | PACK | Freq: Every day | ORAL | Status: DC | PRN

## 2011-05-03 MED ORDER — METOCLOPRAMIDE HCL 10 MG PO TABS: 5.0000 mg | ORAL_TABLET | Freq: Three times a day (TID) | ORAL | Status: DC | PRN

## 2011-05-03 MED ORDER — SODIUM CHLORIDE 0.9 % IV SOLN
INTRAVENOUS | Status: DC
  Administered 2011-05-03: 19:00:00 via INTRAVENOUS
  Administered 2011-05-04: 1000 mL via INTRAVENOUS
  Administered 2011-05-05: 01:00:00 via INTRAVENOUS

## 2011-05-03 MED ORDER — CELECOXIB 100 MG PO CAPS
ORAL_CAPSULE | ORAL | Status: AC
  Administered 2011-05-03: 400 mg via ORAL
  Filled 2011-05-03: qty 4

## 2011-05-03 MED ORDER — MIDAZOLAM HCL 2 MG/2ML IJ SOLN
INTRAMUSCULAR | Status: AC
  Filled 2011-05-03: qty 2

## 2011-05-03 MED ORDER — LEVOTHYROXINE SODIUM 75 MCG PO TABS
150.0000 ug | ORAL_TABLET | Freq: Every day | ORAL | Status: DC
  Administered 2011-05-04 – 2011-05-06 (×3): 150 ug via ORAL
  Filled 2011-05-03: qty 1
  Filled 2011-05-03: qty 2
  Filled 2011-05-03: qty 1
  Filled 2011-05-03: qty 2

## 2011-05-03 MED ORDER — CLOTRIMAZOLE 1 % EX CREA
TOPICAL_CREAM | Freq: Two times a day (BID) | CUTANEOUS | Status: DC | PRN
  Filled 2011-05-03: qty 15

## 2011-05-03 MED ORDER — ONDANSETRON HCL 4 MG PO TABS
4.0000 mg | ORAL_TABLET | Freq: Four times a day (QID) | ORAL | Status: DC | PRN
  Administered 2011-05-03 – 2011-05-06 (×5): 4 mg via ORAL
  Filled 2011-05-03 (×5): qty 1

## 2011-05-03 MED ORDER — CELECOXIB 100 MG PO CAPS
400.0000 mg | ORAL_CAPSULE | Freq: Once | ORAL | Status: AC
  Administered 2011-05-03: 400 mg via ORAL

## 2011-05-03 MED ORDER — PROPOFOL 10 MG/ML IV EMUL
INTRAVENOUS | Status: DC | PRN
  Administered 2011-05-03: 25 ug/kg/min via INTRAVENOUS
  Administered 2011-05-03: 50 ug/kg/min via INTRAVENOUS
  Administered 2011-05-03: 09:00:00 via INTRAVENOUS

## 2011-05-03 MED ORDER — CEFAZOLIN SODIUM-DEXTROSE 2-3 GM-% IV SOLR: 2.0000 g | INTRAVENOUS | Status: DC

## 2011-05-03 MED ORDER — LIDOCAINE HCL (PF) 1 % IJ SOLN
INTRAMUSCULAR | Status: AC
  Filled 2011-05-03: qty 5

## 2011-05-03 MED ORDER — MENTHOL 3 MG MT LOZG: 1.0000 | LOZENGE | OROMUCOSAL | Status: DC | PRN

## 2011-05-03 MED ORDER — PHENOL 1.4 % MT LIQD: 1.0000 | OROMUCOSAL | Status: DC | PRN

## 2011-05-03 MED ORDER — ONDANSETRON HCL 4 MG/2ML IJ SOLN
INTRAMUSCULAR | Status: AC
  Administered 2011-05-03: 4 mg via INTRAVENOUS
  Filled 2011-05-03: qty 2

## 2011-05-03 MED ORDER — METOCLOPRAMIDE HCL 5 MG/ML IJ SOLN: 5.0000 mg | Freq: Three times a day (TID) | INTRAMUSCULAR | Status: DC | PRN

## 2011-05-03 MED ORDER — ONDANSETRON HCL 4 MG/2ML IJ SOLN: 4.0000 mg | Freq: Once | INTRAMUSCULAR | Status: DC

## 2011-05-03 MED ORDER — SODIUM CHLORIDE 0.9 % IR SOLN
Status: DC | PRN
  Administered 2011-05-03: 3000 mL

## 2011-05-03 MED ORDER — MIDAZOLAM HCL 2 MG/2ML IJ SOLN
1.0000 mg | INTRAMUSCULAR | Status: DC | PRN
  Administered 2011-05-03 (×2): 2 mg via INTRAVENOUS

## 2011-05-03 MED ORDER — EPINEPHRINE HCL 0.1 MG/ML IJ SOLN
INTRAMUSCULAR | Status: DC | PRN
  Administered 2011-05-03: .1 ug via INTRAVENOUS

## 2011-05-03 MED ORDER — MIDAZOLAM HCL 2 MG/2ML IJ SOLN
INTRAMUSCULAR | Status: AC
  Administered 2011-05-03: 2 mg via INTRAVENOUS
  Filled 2011-05-03: qty 2

## 2011-05-03 MED ORDER — HYDROCODONE-ACETAMINOPHEN 5-325 MG PO TABS
ORAL_TABLET | ORAL | Status: AC
  Administered 2011-05-03: 1 via ORAL
  Filled 2011-05-03: qty 1

## 2011-05-03 MED ORDER — HYDROCODONE-ACETAMINOPHEN 5-325 MG PO TABS
1.0000 | ORAL_TABLET | Freq: Once | ORAL | Status: AC
  Administered 2011-05-03: 1 via ORAL

## 2011-05-03 MED ORDER — METHOCARBAMOL 100 MG/ML IJ SOLN
500.0000 mg | Freq: Once | INTRAVENOUS | Status: AC
  Administered 2011-05-03: 500 mg via INTRAVENOUS
  Filled 2011-05-03: qty 5

## 2011-05-03 MED ORDER — METHOCARBAMOL 500 MG PO TABS: 500.0000 mg | ORAL_TABLET | Freq: Four times a day (QID) | ORAL | Status: DC | PRN

## 2011-05-03 MED ORDER — PROPOFOL 10 MG/ML IV EMUL
INTRAVENOUS | Status: AC
  Filled 2011-05-03: qty 20

## 2011-05-03 MED ORDER — CEFAZOLIN SODIUM 1-5 GM-% IV SOLN
INTRAVENOUS | Status: AC
  Administered 2011-05-03: 1 g via INTRAVENOUS
  Filled 2011-05-03: qty 50

## 2011-05-03 MED ORDER — FLEET ENEMA 7-19 GM/118ML RE ENEM: 1.0000 | ENEMA | Freq: Every day | RECTAL | Status: DC | PRN

## 2011-05-03 MED ORDER — CEFAZOLIN SODIUM 1 G IJ SOLR
INTRAMUSCULAR | Status: AC
  Administered 2011-05-03: 1 g via INTRAMUSCULAR
  Filled 2011-05-03: qty 10

## 2011-05-03 MED ORDER — MAGNESIUM HYDROXIDE 400 MG/5ML PO SUSP: 30.0000 mL | Freq: Two times a day (BID) | ORAL | Status: DC | PRN

## 2011-05-03 MED ORDER — ACETAMINOPHEN 650 MG RE SUPP: 650.0000 mg | Freq: Four times a day (QID) | RECTAL | Status: DC | PRN

## 2011-05-03 MED ORDER — ALUMINUM HYDROXIDE GEL 600 MG/5ML PO SUSP
15.0000 mL | ORAL | Status: DC | PRN
  Filled 2011-05-03: qty 30

## 2011-05-03 MED ORDER — ASPIRIN EC 325 MG PO TBEC
325.0000 mg | DELAYED_RELEASE_TABLET | Freq: Two times a day (BID) | ORAL | Status: DC
  Administered 2011-05-03 – 2011-05-06 (×6): 325 mg via ORAL
  Filled 2011-05-03 (×6): qty 1

## 2011-05-03 MED ORDER — BUPIVACAINE HCL (PF) 0.25 % IJ SOLN
INTRAMUSCULAR | Status: AC
  Filled 2011-05-03: qty 30

## 2011-05-03 MED ORDER — BUPIVACAINE ON-Q PAIN PUMP (FOR ORDER SET NO CHG)
INJECTION | Status: DC
  Filled 2011-05-03: qty 1

## 2011-05-03 MED ORDER — HYDROCODONE-ACETAMINOPHEN 10-325 MG PO TABS
1.0000 | ORAL_TABLET | ORAL | Status: DC | PRN
  Administered 2011-05-05 – 2011-05-06 (×2): 1 via ORAL
  Filled 2011-05-03 (×2): qty 1

## 2011-05-03 MED ORDER — SODIUM CHLORIDE 0.9 % IR SOLN
Status: DC | PRN
  Administered 2011-05-03: 1000 mL

## 2011-05-03 MED ORDER — ACETAMINOPHEN 500 MG PO TABS
ORAL_TABLET | ORAL | Status: AC
  Administered 2011-05-03: 500 mg via ORAL
  Filled 2011-05-03: qty 1

## 2011-05-03 MED ORDER — BUPIVACAINE-EPINEPHRINE 0.5% -1:200000 IJ SOLN
INTRAMUSCULAR | Status: DC | PRN
  Administered 2011-05-03: 60 mL

## 2011-05-03 MED ORDER — FENTANYL CITRATE 0.05 MG/ML IJ SOLN
INTRAMUSCULAR | Status: DC | PRN
  Administered 2011-05-03: 12.5 ug via INTRATHECAL

## 2011-05-03 MED ORDER — BUPIVACAINE IN DEXTROSE 0.75-8.25 % IT SOLN
INTRATHECAL | Status: AC
  Filled 2011-05-03: qty 2

## 2011-05-03 MED ORDER — HYDROMORPHONE HCL 1 MG/ML IJ SOLN
INTRAMUSCULAR | Status: AC
  Administered 2011-05-03: 1 mg via INTRAVENOUS
  Filled 2011-05-03: qty 1

## 2011-05-03 MED ORDER — BUPIVACAINE-EPINEPHRINE PF 0.5-1:200000 % IJ SOLN
INTRAMUSCULAR | Status: AC
  Filled 2011-05-03: qty 20

## 2011-05-03 MED ORDER — ALUM & MAG HYDROXIDE-SIMETH 200-200-20 MG/5ML PO SUSP: 30.0000 mL | ORAL | Status: DC | PRN

## 2011-05-03 MED ORDER — FENTANYL CITRATE 0.05 MG/ML IJ SOLN
INTRAMUSCULAR | Status: DC | PRN
  Administered 2011-05-03 (×2): 25 ug via INTRAVENOUS
  Administered 2011-05-03: 37.5 ug via INTRAVENOUS

## 2011-05-03 MED ORDER — BISACODYL 10 MG RE SUPP
10.0000 mg | Freq: Every day | RECTAL | Status: DC | PRN
Start: 2011-05-03 — End: 2011-05-06

## 2011-05-03 MED ORDER — DOCUSATE SODIUM 100 MG PO CAPS
100.0000 mg | ORAL_CAPSULE | Freq: Two times a day (BID) | ORAL | Status: DC
  Administered 2011-05-03 – 2011-05-06 (×6): 100 mg via ORAL
  Filled 2011-05-03 (×6): qty 1

## 2011-05-03 MED ORDER — CEFAZOLIN SODIUM-DEXTROSE 2-3 GM-% IV SOLR
2.0000 g | Freq: Four times a day (QID) | INTRAVENOUS | Status: AC
  Administered 2011-05-03 – 2011-05-04 (×3): 2 g via INTRAVENOUS
  Filled 2011-05-03 (×3): qty 50

## 2011-05-03 MED ORDER — ACETAMINOPHEN 500 MG PO TABS
500.0000 mg | ORAL_TABLET | Freq: Once | ORAL | Status: AC
  Administered 2011-05-03: 500 mg via ORAL

## 2011-05-03 SURGICAL SUPPLY — 76 items
BAG HAMPER (MISCELLANEOUS) ×2 IMPLANT
BANDAGE ACE 4 STERILE (GAUZE/BANDAGES/DRESSINGS) ×1 IMPLANT
BANDAGE ELASTIC 4 VELCRO NS (GAUZE/BANDAGES/DRESSINGS) ×1 IMPLANT
BANDAGE ELASTIC 6 VELCRO NS (GAUZE/BANDAGES/DRESSINGS) ×4 IMPLANT
BANDAGE ELASTIC 6 VELCRO ST LF (GAUZE/BANDAGES/DRESSINGS) ×1 IMPLANT
BANDAGE ESMARK 6X9 LF (GAUZE/BANDAGES/DRESSINGS) ×1 IMPLANT
BIT DRILL 3.2X128 (BIT) ×1 IMPLANT
BLADE HEX COATED 2.75 (ELECTRODE) ×2 IMPLANT
BLADE SAG 18X100X1.27 (BLADE) ×2 IMPLANT
BLADE SAGITTAL 25.0X1.27X90 (BLADE) ×1 IMPLANT
BLADE SAW SAG 90X13X1.27 (BLADE) ×2 IMPLANT
BLADE SURG SZ10 CARB STEEL (BLADE) ×2 IMPLANT
BNDG CMPR 9X6 STRL LF SNTH (GAUZE/BANDAGES/DRESSINGS) ×1
BNDG ESMARK 6X9 LF (GAUZE/BANDAGES/DRESSINGS) ×2
BOWL SMART MIX CTS (DISPOSABLE) ×1 IMPLANT
CEMENT HV SMART SET (Cement) ×4 IMPLANT
CHLORAPREP W/TINT 26ML (MISCELLANEOUS) ×2 IMPLANT
CLOTH BEACON ORANGE TIMEOUT ST (SAFETY) ×2 IMPLANT
COOLER CRYO CUFF IC AND MOTOR (MISCELLANEOUS) ×2 IMPLANT
COVER FOOT RIGID SOLE LRG (MISCELLANEOUS) ×1 IMPLANT
COVER LIGHT HANDLE STERIS (MISCELLANEOUS) ×4 IMPLANT
COVER PROBE W GEL 5X96 (DRAPES) ×3 IMPLANT
CUFF CRYO KNEE LG 20X31 COOLER (ORTHOPEDIC SUPPLIES) ×2 IMPLANT
CUFF CRYO KNEE18X23 MED (MISCELLANEOUS) ×1 IMPLANT
CUFF TOURNIQUET SINGLE 34IN LL (TOURNIQUET CUFF) ×2 IMPLANT
CUFF TOURNIQUET SINGLE 44IN (TOURNIQUET CUFF) ×1 IMPLANT
DECANTER SPIKE VIAL GLASS SM (MISCELLANEOUS) ×1 IMPLANT
DRAPE BACK TABLE (DRAPES) ×2 IMPLANT
DRAPE EXTREMITY T 121X128X90 (DRAPE) ×2 IMPLANT
DRAPE U-SHAPE 47X51 STRL (DRAPES) ×2 IMPLANT
DRESSING ALLEVYN BORDER HEEL (GAUZE/BANDAGES/DRESSINGS) ×1 IMPLANT
DRSG MEPILEX BORDER 4X12 (GAUZE/BANDAGES/DRESSINGS) ×2 IMPLANT
ELECT REM PT RETURN 9FT ADLT (ELECTROSURGICAL) ×2
ELECTRODE REM PT RTRN 9FT ADLT (ELECTROSURGICAL) ×1 IMPLANT
FACESHIELD LNG OPTICON STERILE (SAFETY) ×2 IMPLANT
GLOVE BIOGEL PI IND STRL 7.0 (GLOVE) IMPLANT
GLOVE BIOGEL PI INDICATOR 7.0 (GLOVE) ×2
GLOVE ECLIPSE 6.5 STRL STRAW (GLOVE) ×1 IMPLANT
GLOVE ECLIPSE 7.0 STRL STRAW (GLOVE) ×1 IMPLANT
GLOVE EXAM NITRILE MD LF STRL (GLOVE) ×1 IMPLANT
GLOVE OPTIFIT SS 8.0 STRL (GLOVE) ×2 IMPLANT
GLOVE SKINSENSE NS SZ8.0 LF (GLOVE) ×2
GLOVE SKINSENSE STRL SZ8.0 LF (GLOVE) ×2 IMPLANT
GLOVE SS N UNI LF 8.5 STRL (GLOVE) ×3 IMPLANT
GOWN BRE IMP SLV AUR XL STRL (GOWN DISPOSABLE) ×6 IMPLANT
GOWN STRL REIN XL XLG (GOWN DISPOSABLE) ×2 IMPLANT
HANDPIECE INTERPULSE COAX TIP (DISPOSABLE) ×2
HOOD W/PEELAWAY (MISCELLANEOUS) ×8 IMPLANT
INST SET MAJOR BONE (KITS) ×2 IMPLANT
IV NS IRRIG 3000ML ARTHROMATIC (IV SOLUTION) ×2 IMPLANT
KIT BLADEGUARD II DBL (SET/KITS/TRAYS/PACK) ×2 IMPLANT
KIT ROOM TURNOVER APOR (KITS) ×2 IMPLANT
MANIFOLD NEPTUNE II (INSTRUMENTS) ×2 IMPLANT
MARKER SKIN DUAL TIP RULER LAB (MISCELLANEOUS) ×2 IMPLANT
NDL HYPO 21X1.5 SAFETY (NEEDLE) ×1 IMPLANT
NEEDLE HYPO 21X1.5 SAFETY (NEEDLE) ×2 IMPLANT
NS IRRIG 1000ML POUR BTL (IV SOLUTION) ×2 IMPLANT
PACK TOTAL JOINT (CUSTOM PROCEDURE TRAY) ×2 IMPLANT
PAD ARMBOARD 7.5X6 YLW CONV (MISCELLANEOUS) ×2 IMPLANT
PAD DANNIFLEX CPM (ORTHOPEDIC SUPPLIES) ×2 IMPLANT
PAIN PUMP ON-Q 270MLX5ML 2.5IN (PAIN MANAGEMENT) ×2 IMPLANT
PIN TROCAR 3 INCH (PIN) ×2 IMPLANT
SET BASIN LINEN APH (SET/KITS/TRAYS/PACK) ×2 IMPLANT
SET HNDPC FAN SPRY TIP SCT (DISPOSABLE) ×1 IMPLANT
SPONGE GAUZE 4X4 12PLY (GAUZE/BANDAGES/DRESSINGS) IMPLANT
STAPLER VISISTAT 35W (STAPLE) ×2 IMPLANT
SUT BRALON NAB BRD #1 30IN (SUTURE) ×7 IMPLANT
SUT MON AB 0 CT1 (SUTURE) ×4 IMPLANT
SUT MON AB 2-0 CT1 36 (SUTURE) ×2 IMPLANT
SYR 30ML LL (SYRINGE) ×2 IMPLANT
SYR BULB IRRIGATION 50ML (SYRINGE) ×3 IMPLANT
TOWEL OR 17X26 4PK STRL BLUE (TOWEL DISPOSABLE) ×2 IMPLANT
TOWER CARTRIDGE SMART MIX (DISPOSABLE) ×2 IMPLANT
TRAY FOLEY CATH 14FR (SET/KITS/TRAYS/PACK) ×2 IMPLANT
WATER STERILE IRR 1000ML POUR (IV SOLUTION) ×7 IMPLANT
YANKAUER SUCT 12FT TUBE ARGYLE (SUCTIONS) ×2 IMPLANT

## 2011-05-03 NOTE — Op Note (Signed)
Preop diagnosis osteoarthritis right knee Postoperative diagnosis osteoarthritis right knee Procedure right total knee arthroplasty Implants size 2.5 femur, size 3 tibia, size 12.5 polyethylene insert, size 32 patella, Depue posterior stabilized total knee SYSTEM. Surgeon was Dr. Romeo Apple Assistants were obtained Cyndia Diver and Haydee Monica Operative findings severe flexion contracture 20, valgus malalignment 11, preop flexion 95 under anesthesia.  The patient was identified in the preop holding area the surgical site was marked and countersigned, the chart was updated the consent was completed.  The patient was taken to the operating room for spinal anesthetic she was given 2 g of Ancef because of her weight over 80 kg, Foley catheter was inserted sterilely. Her leg was prepped and draped sterilely. The limb was exsanguinated with a six-inch Esmarch the tourniquet was elevated to 300 mm mercury where it stayed for 100 minutes. Time out procedure was executed. A midline incision was made over the patella this was taken down to the extensor mechanism. A medial arthrotomy was then performed. The patella was everted. Osteophytes were removed. The notch was stenotic and the bone was removed with an osteotome. The distal femur was entered with a three-eighths inch drill bit. The canal was decompressed and suctioned. The distal femoral cut was set for 13 mm with a 5 valgus angle. The block was pinned in place and the distal femoral cut was made. Because it was so tight the tibial cut was made  The tibial guide was set for a 2 mm resection from the deficient side. This was sent for neutral and the vastus lateralis was set for a neutral tibial alignment. This cut was resected with an oscillating saw and removed and checked for thickness.  We then turned our attention back to the femur the femur sized to 2.5 external rotation was set based on the epicondyles the block was pinned in place and the 4 cuts were  made. We then checked the flexion extension gaps the extension gap was tight we therefore resected an additional 2 mm of distal femur which opened up the flexion gap we also did a posterior capsular release remove the osteophytes and stripped soft tissues off the posterior femur. We also did a complete posterior capsular release and partial release of the iliotibial band  Flexion-extension gaps then were balanced. We then cut the femoral notch onto the tibia. We then prepared the patella for resection. The patella measured 20 mm in thickness. We removed the millimeters of bone and placed an 8 mm thickness 32 patellar drill the pegs. The proximal tibia was then punched after reaming.  The bone was irrigated and dried. The implants were cemented in place the cement was allowed to cure. Trial reductions were done using a 10 and a 12.5 insert. The 12.5 gave the best stability and range of motion. Passive flexion test at 105 of knee flexion.  After further irrigation the joint was injected including the soft tissues with total 60 cc of plain Marcaine with epinephrine, the extensor mechanism was closed with interrupted #1 nylon sutures. . Skin staples were used to reapproximate the skin. A postoperative x-ray was obtained and implants are in good position.  The patient will be taken to the recovery room in stable condition where she will [place] cleared to go to the floor and then we will start routine postop protocol for total knee replacement replacement.

## 2011-05-03 NOTE — Transfer of Care (Signed)
Immediate Anesthesia Transfer of Care Note  Patient: [name]  Procedure(s) Performed:  TOTAL KNEE ARTHROPLASTY - With DePuy  Patient Location: PACU  Anesthesia Type: Spinal  Level of Consciousness: awake  Airway & Oxygen Therapy: Patient Spontanous Breathing and non-rebreather face mask  Post-op Assessment: Report given to PACU RN, Post -op Vital signs reviewed and stable and Patient moving all extremities  Post vital signs: Reviewed and stable  Complications: complaining new onset chest pain Dr. Jayme Cloud call. 12 lead obtained SR 1st Degree AV block

## 2011-05-03 NOTE — Anesthesia Postprocedure Evaluation (Signed)
  Anesthesia Post-op Note  Patient: [name]  Procedure(s) Performed:  TOTAL KNEE ARTHROPLASTY - With DePuy  Patient Location: PACU  Anesthesia Type: Spinal  Level of Consciousness: awake  Airway and Oxygen Therapy: Patient Spontanous Breathing  Post-op Pain: mild, pt c/o of mild chest pain....found this to be musculo-skeletal due to hx arthritis and  lying flat on OR table. PACU EKG showed no changes.  Post-op Assessment: Patient's Cardiovascular Status Stable, Respiratory Function Stable, Patent Airway and No signs of Nausea or vomiting  Post-op Vital Signs: stable  Complications: No apparent anesthesia complications

## 2011-05-03 NOTE — Anesthesia Procedure Notes (Addendum)
Spinal Block  Start time: 05/03/2011 8:12 AM End time: 05/03/2011 8:17 AM Staffing CRNA/Resident: Minerva Areola Preanesthetic Checklist Completed: patient identified, site marked, surgical consent, pre-op evaluation, timeout performed, IV checked, risks and benefits discussed and monitors and equipment checked Spinal Block Patient position: right lateral decubitus Prep: Betadine, site prepped and draped and prep x 3 Patient monitoring: heart rate, cardiac monitor, continuous pulse ox and blood pressure Approach: right paramedian Location: L3-4 (paramedian 1% lidocaine localization skinwheal) Injection technique: single-shot Needle Needle type: Spinocan  Needle gauge: 22 G Needle length: 3 in. Assessment Sensory level: T6 (bilat T6  at 0828) Events: clear freeflowing csf Additional Notes Skin localization 1% Lidocaine paramedian Marcaine 15 mg/ Fentanyl 12.5 mcg/ Epi 0.1 Tray Lot [id number] Tray expiration: 2012-10

## 2011-05-03 NOTE — Addendum Note (Signed)
Addendum  created 05/03/11 1230 by Minerva Areola, CRNA   Modules edited:Anesthesia Events

## 2011-05-03 NOTE — Interval H&P Note (Signed)
History and Physical Interval Note:   05/03/2011   7:23 AM   [name]  has presented today for surgery, with the diagnosis of osteoarthritis right knee  The various methods of treatment have been discussed with the patient and family. After consideration of risks, benefits and other options for treatment, the patient has consented to  Procedure(s): Right TOTAL KNEE ARTHROPLASTY as a surgical intervention .  I have reviewed the patients' chart and labs.  Questions were answered to the patient's satisfaction.     Fuller Canada  MD

## 2011-05-03 NOTE — Anesthesia Preprocedure Evaluation (Addendum)
Anesthesia Evaluation Anesthesia Physical Anesthesia Plan  ASA: III  Anesthesia Plan: Spinal   Post-op Pain Management:    Induction:   Airway Management Planned: Nasal Cannula  Additional Equipment:   Intra-op Plan:   Post-operative Plan:   Informed Consent: I have reviewed the patients History and Physical, chart, labs and discussed the procedure including the risks, benefits and alternatives for the proposed anesthesia with the patient or authorized representative who has indicated his/her understanding and acceptance.     Plan Discussed with:   Anesthesia Plan Comments:        Anesthesia Quick Evaluation

## 2011-05-03 NOTE — H&P (View-Only) (Signed)
[name] is an 77 y.o. female.   Chief Complaint: right knee pain  HPI: 76 female with h/o left TKA dis well now presents with C. Disabling RIGHT knee pain, which is interfering with her activities of daily living. The pain is non-radiating confined to the RIGHT knee joint associated with swelling, catching, locking, and giving out.  Review of systems  Past Medical History  Diagnosis Date  . Hyperlipidemia   . Hypothyroidism   . Anxiety   . Depression   . Arthritis   . Chronic bronchitis   . Blood transfusion   . Constipation     Past Surgical History  Procedure Date  . Tubal ligation   . Joint replacement 05/2010    left total knee Dr. Romeo Apple  . Total thyroidectomy March 2011    Dr. Suszanne Conners @ [facility]  . Left hip replacement @ [facility] '98   . Left total knee     Family History  Problem Relation Age of Onset  . Anesthesia problems Neg Hx   . Hypotension Neg Hx   . Malignant hyperthermia Neg Hx   . Pseudochol deficiency Neg Hx    Social History:  reports that she has never smoked. She does not have any smokeless tobacco history on file. She reports that she does not drink alcohol or use illicit drugs.  Allergies: No Known Allergies  Medications Prior to Admission  Medication Sig Dispense Refill  . ALPRAZolam (XANAX) 0.25 MG tablet Take 0.25 mg by mouth at bedtime as needed. For anxiety      . clotrimazole (LOTRIMIN) 1 % cream Apply topically 2 (two) times daily between meals as needed. For rash       . furosemide (LASIX) 40 MG tablet       . HYDROcodone-acetaminophen (NORCO) 10-325 MG per tablet 1 tablet every 4 (four) hours as needed. For pain      . ibuprofen (ADVIL,MOTRIN) 200 MG tablet Take 200 mg by mouth every 6 (six) hours as needed. For pain       . levothyroxine (SYNTHROID, LEVOTHROID) 150 MCG tablet       . PARoxetine (PAXIL) 20 MG tablet        Medications Prior to Admission  Medication Dose Route Frequency Provider Last Rate Last Dose  . lactated  ringers infusion   Intravenous Continuous Laurene Footman        No results found for this or any previous visit (from the past 48 hour(s)). @RISRSLT48 @  Review of Systems  Constitutional: Negative.   HENT: Negative.   Eyes: Negative.   Respiratory: Positive for shortness of breath.   Cardiovascular: Positive for orthopnea.  Gastrointestinal: Negative.   Genitourinary: Negative.   Musculoskeletal: Positive for back pain and joint pain.  Skin:       Venous stasis  Neurological: Negative.   Endo/Heme/Allergies: Negative.   Psychiatric/Behavioral: Negative.     There were no vitals taken for this visit. Physical Exam  Vital signs are stable as recorded  General appearance is normal  The patient is alert and oriented x3  [name] mood and affect are normal  Gait assessment: walker hunched over The cardiovascular exam reveals normal pulses and temperature without edema swelling.  The lymphatic system is negative for palpable lymph nodes  The sensory exam is normal.  There are no pathologic reflexes.  Balance is questionable   Exam of the right knee Inspection valgus alignment Range of motion 110 Stability normal collaterals; and cruciates  Strength good  Skin normal   Upper extremity exam  Inspection and palpation revealed no abnormalities in the upper extremities.  Range of motion is full without contracture.  Motor exam is normal with grade 5 strength.  The joints are fully reduced without subluxation.  There is no atrophy or tremor and muscle tone is normal.  All joints are stable.  Left knee: aligned normal / skin incision normal / 115 ROM / motor grade 5 / stability normal   X-rays valgus, OA lateral compartment, severe; osteophytes    Assessment/Plan OA RIGHT KNEE   RT TKA/ DEPUY  Fuller Canada 04/28/2011, 11:34 AM

## 2011-05-03 NOTE — Brief Op Note (Signed)
05/03/2011  10:31 AM  PATIENT:  [name]  77 y.o. female  PRE-OPERATIVE DIAGNOSIS:  osteoarthritis right knee  POST-OPERATIVE DIAGNOSIS:  osteoarthritis right knee  PROCEDURE:  Procedure(s): RIGHT TOTAL KNEE ARTHROPLASTY DEPUY 2.5 F ; 3 T ; 12.5 POLY; AND 32 PATELLA   SURGEON:  Surgeon(s): Fuller Canada, MD  PHYSICIAN ASSISTANT:   ASSISTANTS: WAYNE MCFATTER AND JUDY BURROUGHS    ANESTHESIA:   spinal  ESTIMATED BLOOD LOSS: 0   BLOOD ADMINISTERED:none  DRAINS: none   LOCAL MEDICATIONS USED:  MARCAINE 60CC  SPECIMEN:  No Specimen  DISPOSITION OF SPECIMEN:  N/A  COUNTS:  YES  TOURNIQUET:   Total Tourniquet Time Documented: Thigh (Right) - 101 minutes  DICTATION #:   PLAN OF CARE: PACU then Floor   PATIENT DISPOSITION:  PACU - hemodynamically stable.   Delay start of Pharmacological VTE agent (>24hrs) due to surgical blood loss or risk of bleeding:  yes

## 2011-05-04 DIAGNOSIS — M1710 Unilateral primary osteoarthritis, unspecified knee: Principal | ICD-10-CM

## 2011-05-04 DIAGNOSIS — IMO0002 Reserved for concepts with insufficient information to code with codable children: Principal | ICD-10-CM

## 2011-05-04 LAB — BASIC METABOLIC PANEL
CO2: 24 mEq/L (ref 19–32)
Calcium: 8.5 mg/dL (ref 8.4–10.5)
Creatinine, Ser: 0.69 mg/dL (ref 0.50–1.10)
GFR calc non Af Amer: >60 mL/min (ref >60)
Sodium: 139 mEq/L (ref 135–145)

## 2011-05-04 LAB — CBC
MCH: 29.1 pg (ref 26.0–34.0)
MCHC: 32.7 g/dL (ref 30.0–36.0)
MCV: 89.1 fL (ref 78.0–100.0)
Platelets: 196 10*3/uL (ref 150–400)
RBC: 3.85 MIL/uL — ABNORMAL LOW (ref 3.87–5.11)
RDW: 13 % (ref 11.5–15.5)

## 2011-05-04 MED ORDER — IPRATROPIUM BROMIDE 0.02 % IN SOLN
0.5000 mg | RESPIRATORY_TRACT | Status: DC
  Administered 2011-05-04 – 2011-05-06 (×11): 0.5 mg via RESPIRATORY_TRACT
  Filled 2011-05-04 (×12): qty 2.5

## 2011-05-04 MED ORDER — ALPRAZOLAM 0.25 MG PO TABS
0.2500 mg | ORAL_TABLET | Freq: Three times a day (TID) | ORAL | Status: DC | PRN
  Administered 2011-05-04 (×2): 0.25 mg via ORAL
  Filled 2011-05-04 (×2): qty 1

## 2011-05-04 MED ORDER — SODIUM CHLORIDE 0.9 % IJ SOLN
INTRAMUSCULAR | Status: AC
  Administered 2011-05-04
  Filled 2011-05-04: qty 10

## 2011-05-04 MED ORDER — ALBUTEROL SULFATE (5 MG/ML) 0.5% IN NEBU
2.5000 mg | INHALATION_SOLUTION | RESPIRATORY_TRACT | Status: DC
  Administered 2011-05-04 – 2011-05-06 (×11): 2.5 mg via RESPIRATORY_TRACT
  Filled 2011-05-04 (×12): qty 0.5

## 2011-05-04 NOTE — Progress Notes (Signed)
Physical Therapy Treatment Patient Name: [name] Date: 05/04/2011 Problem List:  Patient Active Problem List  Diagnoses  . KNEE, ARTHRITIS, DEGEN./OSTEO  . FOOT PAIN  . TOTAL KNEE FOLLOW-UP  . ABSCESS, SKIN   Past Medical History:  Past Medical History  Diagnosis Date  . Hyperlipidemia   . Hypothyroidism   . Anxiety   . Depression   . Arthritis   . Chronic bronchitis   . Blood transfusion   . Constipation    Past Surgical History:  Past Surgical History  Procedure Date  . Tubal ligation   . Joint replacement 05/2010    left total knee Dr. Romeo Apple  . Total thyroidectomy March 2011    Dr. Suszanne Conners @ [facility]  . Left hip replacement @ [facility] '98   . Left total knee    Precautions/Restrictions  Precautions Precautions: Knee Precaution Booklet Issued: No (has one from previous TKR) Required Braces or Orthoses: No Restrictions Weight Bearing Restrictions: No RLE Weight Bearing: Weight bearing as tolerated Other Position/Activity Restrictions: no pillow under R knee Mobility (including Balance) Bed Mobility Bed Mobility: Yes Supine to Sit: 2: Max assist;HOB elevated (Comment degrees) (HOB at 60 degrees) Sitting - Scoot to Edge of Bed: With rail;3: Mod assist Sit to Supine - Right: 2: Max assist;With rail Transfers Transfers: Yes Sit to Stand: 2: Max assist;From elevated surface;With upper extremity assist;From bed (unable to achieve stance-3 tries) Sit to Stand Details (indicate cue type and reason): LLE strength is decreased from prior TKR Stand Pivot Transfers: 1: +1 Total assist Stand Pivot Transfer Details (indicate cue type and reason): extremely difficult transfer-pt has difficulty lifting her body weight Ambulation/Gait Ambulation/Gait: No (unable) Stairs: No Wheelchair Mobility Wheelchair Mobility: No  Posture/Postural Control Posture/Postural Control: No significant limitations Balance Balance Assessed: No Exercise  Total Joint  Exercises Ankle Circles/Pumps: AROM;Both;10 reps;Supine Quad Sets: AROM;Both;10 reps;Supine Short Arc Quad: AAROM;Right;10 reps;Supine Heel Slides: AAROM;Right;10 reps;Supine Knee Flexion: AAROM;Right;5 reps;Seated General Exercises - Lower Extremity Ankle Circles/Pumps: AROM;Both;10 reps;Supine Quad Sets: AROM;Both;10 reps;Supine Short Arc Quad: AAROM;Right;10 reps;Supine Heel Slides: AAROM;Right;10 reps;Supine Low Level/ICU Exercises Ankle Circles/Pumps: AROM;Both;10 reps;Supine Quad Sets: AROM;Both;10 reps;Supine Short Arc Quad: AAROM;Right;10 reps;Supine Heel Slides: AAROM;Right;10 reps;Supine  End of Session PT - End of Session Equipment Utilized During Treatment: Gait belt Activity Tolerance: Patient tolerated treatment well;Patient limited by pain Patient left: in bed;in CPM;with call bell in reach Nurse Communication: Mobility status for transfers General Behavior During Session: [facility] for tasks performed Cognition: [facility] for tasks performed PT Assessment/Plan  PT - Assessment/Plan Comments on Treatment Session: unable to do any exercise this tx due to pain, fatigue PT Plan: Discharge plan remains appropriate PT Frequency: Min 5X/week Follow Up Recommendations: Skilled nursing facility Equipment Recommended: Defer to next venue PT Goals  Acute Rehab PT Goals PT Goal Formulation: With patient Time For Goal Achievement: 7 days Pt will go Sit to Supine/Side: with mod assist PT Goal: Sit to Supine/Side - Progress: Progressing toward goal Pt will Transfer Sit to Stand/Stand to Sit: with max assist (and achieve full stance) PT Transfer Goal: Sit to Stand/Stand to Sit - Progress: Progressing toward goal Pt will Transfer Bed to Chair/Chair to Bed: with max assist PT Transfer Goal: Bed to Chair/Chair to Bed - Progress: Progressing toward goal Pt will Ambulate: 1 - 15 feet;with max assist;with rolling walker PT Goal: Ambulate - Progress: Not met  Konrad Penta 05/04/2011,  11:51 AM

## 2011-05-04 NOTE — Progress Notes (Signed)
Subjective: 1 Day Post-Op Procedure(s) (LRB): TOTAL KNEE ARTHROPLASTY (Right) Patient reports pain as mild.    Objective: Vital signs in last 24 hours: Temp:  [97.3 F (36.3 C)-99.1 F (37.3 C)] 99.1 F (37.3 C) (08/07 0608) Pulse Rate:  [59-73] 67  (08/07 0608) Resp:  [12-20] 18  (08/07 0608) BP: (99-170)/(57-102) 127/66 mmHg (08/07 0608) SpO2:  [88 %-100 %] 96 % (08/07 1610)  Intake/Output from previous day: 08/06 0701 - 08/07 0700 In: 3373 [P.O.:240; I.V.:3083; IV Piggyback:50] Out: 1275 [Urine:1250; Blood:25] Intake/Output this shift:     Basename 05/04/11 0502  HGB 11.2*    Basename 05/04/11 0502  WBC 10.0  RBC 3.85*  HCT 34.3*  PLT 196    Basename 05/04/11 0502  NA 139  K 3.7  CL 104  CO2 24  BUN 13  CREATININE 0.69  GLUCOSE 141*  CALCIUM 8.5   No results found for this basename: LABPT:2,INR:2 in the last 72 hours  Neurologically intact Neurovascular intact Sensation intact distally Intact pulses distally Dorsiflexion/Plantar flexion intact Compartment soft  Assessment/Plan: 1 Day Post-Op Procedure(s) (LRB): TOTAL KNEE ARTHROPLASTY (Right) Advance diet Up with therapy D/C IV fluids Consult Dr Adele Barthel 05/04/2011, 7:24 AM

## 2011-05-04 NOTE — Plan of Care (Signed)
Problem: Consults Goal: Diagnosis- Total Joint Replacement Outcome: Completed/Met Date Met:  05/04/11 Primary Total Knee-right knee

## 2011-05-04 NOTE — Consult Note (Signed)
Consult requested by:Dr. Romeo Apple Consult requested formedical management status post knee replacment with COPD:  HPI:she is a 77 year old who is in for knee replacement. She has multiple other medical problems including chronic bronchitis and is having some trouble with shortness breath and cough.  Past Medical History  Diagnosis Date  . Hyperlipidemia   . Hypothyroidism   . Anxiety   . Depression   . Arthritis   . Chronic bronchitis   . Blood transfusion   . Constipation      Family History  Problem Relation Age of Onset  . Anesthesia problems Neg Hx   . Hypotension Neg Hx   . Malignant hyperthermia Neg Hx   . Pseudochol deficiency Neg Hx      History   Social History  . Marital Status: Widowed    Spouse Name: N/A    Number of Children: N/A  . Years of Education: N/A   Social History Main Topics  . Smoking status: Never Smoker   . Smokeless tobacco: None  . Alcohol Use: No  . Drug Use: No  . Sexually Active:    Other Topics Concern  . None   Social History Narrative  . None     ROS: her except as mentioned is negative    Objective: Vital signs in last 24 hours: Temp:  [98.7 F (37.1 C)-99.6 F (37.6 C)] 99.6 F (37.6 C) (08/07 1824) Pulse Rate:  [67-84] 84  (08/07 1824) Resp:  [17-20] 18  (08/07 1824) BP: (109-153)/(66-72) 134/72 mmHg (08/07 1824) SpO2:  [82 %-100 %] 82 % (08/07 1922) Weight change:  Last BM Date: 05/01/11  Intake/Output from previous day: 08/06 0701 - 08/07 0700 In: 3373 [P.O.:240; I.V.:3083; IV Piggyback:50] Out: 1275 [Urine:1250; Blood:25]  PHYSICAL EXAM She is awake and alert. Her heart is regular without murmur gallop or rub. Her chest shows rhonchi bilaterally. Her abdomen is soft obese without masses. Her extremities show her surgical scar but no edema. Her HEENT examination is unremarkable. Her neurological examination is grossly intact  Lab Results: Basic Metabolic Panel:  Basename 05/04/11 0502  NA 139  K 3.7    CL 104  CO2 24  GLUCOSE 141*  BUN 13  CREATININE 0.69  CALCIUM 8.5  MG --  PHOS --   Liver Function Tests: No results found for this basename: AST:2,ALT:2,ALKPHOS:2,BILITOT:2,PROT:2,ALBUMIN:2 in the last 72 hours No results found for this basename: LIPASE:2,AMYLASE:2 in the last 72 hours CBC:  Basename 05/04/11 0502  WBC 10.0  NEUTROABS --  HGB 11.2*  HCT 34.3*  MCV 89.1  PLT 196   Cardiac Enzymes: No results found for this basename: CKTOTAL:3,CKMB:3,CKMBINDEX:3,TROPONINI:3 in the last 72 hours BNP: No results found for this basename: POCBNP:3 in the last 72 hours D-Dimer: No results found for this basename: DDIMER:2 in the last 72 hours CBG: No results found for this basename: GLUCAP:6 in the last 72 hours Hemoglobin A1C: No results found for this basename: HGBA1C in the last 72 hours Fasting Lipid Panel: No results found for this basename: CHOL,HDL,LDLCALC,TRIG,CHOLHDL,LDLDIRECT in the last 72 hours Thyroid Function Tests: No results found for this basename: TSH,T4TOTAL,FREET4,T3FREE,THYROIDAB in the last 72 hours Anemia Panel: No results found for this basename: VITAMINB12,FOLATE,FERRITIN,TIBC,IRON,RETICCTPCT in the last 72 hours Urine Drug Screen:  Urinalysis:  Misc. Labs:   ABGS: No results found for this basename: PHART,PCO2,PO2ART,TCO2,HCO3 in the last 72 hours   MICROBIOLOGY: Recent Results (from the past 240 hour(s))  SURGICAL PCR SCREEN     Status: Normal  Collection Time   04/28/11 11:15 AM      Component Value Range Status Comment   MRSA, PCR NEGATIVE  NEGATIVE  Final    Staphylococcus aureus NEGATIVE  NEGATIVE  Final     Studies/Results: Dg Knee Right Port  05/03/2011  *RADIOLOGY REPORT*  Clinical Data: Postop right knee replacement  PORTABLE RIGHT KNEE - 1-2 VIEW  Comparison: None  Findings: There are immediate postoperative changes of total right knee replacement. The knee is aligned.  The hardware appears well seated.  There is expected  subcutaneous and joint gas.  Seen on the lateral projection is a 1.4 x 0.7 cm bone fragment in the anterior subcutaneous tissues, at the level of the superior most skin stable.  IMPRESSION:  1.  1.4 cm bony fragment is seen on the lateral view in the subcutaneous tissues anteriorly as described above.  No preoperative radiographs for comparison. 2.  Total right hip arthroplasty.  Original Report Authenticated By: Britta Mccreedy, M.D.    Medications:  Prior to Admission:  Prescriptions prior to admission  Medication Sig Dispense Refill  . ALPRAZolam (XANAX) 0.25 MG tablet Take 0.25 mg by mouth at bedtime as needed. For anxiety      . clotrimazole (LOTRIMIN) 1 % cream Apply topically 2 (two) times daily between meals as needed. For rash       . furosemide (LASIX) 40 MG tablet       . HYDROcodone-acetaminophen (NORCO) 10-325 MG per tablet 1 tablet every 4 (four) hours as needed. For pain      . levothyroxine (SYNTHROID, LEVOTHROID) 150 MCG tablet       . PARoxetine (PAXIL) 20 MG tablet       . ibuprofen (ADVIL,MOTRIN) 200 MG tablet Take 200 mg by mouth every 6 (six) hours as needed. For pain        Scheduled:   . ipratropium  0.5 mg Nebulization Q4H   And  . albuterol  2.5 mg Nebulization Q4H  . aspirin  325 mg Oral BID  . bupivacaine 0.75% in dextrose 8.25% (intrathecal)      . ceFAZolin (ANCEF) IV  2 g Intravenous Q6H  . docusate sodium  100 mg Oral BID  . furosemide  40 mg Oral Daily  . levothyroxine  150 mcg Oral QAC breakfast  . PARoxetine  20 mg Oral Daily  . propofol       Continuous:   . sodium chloride 1,000 mL (05/04/11 1058)  . bupivacaine ON-Q pain pump     EAV:[id number], acetaminophen, ALPRAZolam, alum & mag hydroxide-simeth, aluminum hydroxide, bisacodyl, bisacodyl, clotrimazole, HYDROcodone-acetaminophen, HYDROmorphone, magnesium hydroxide, menthol-cetylpyridinium, methocarbamol(ROBAXIN) IV, methocarbamol, metoCLOPramide (REGLAN) injection, metoCLOPramide, ondansetron,  oxyCODONE, phenol, polyethylene glycol, sodium phosphate, DISCONTD: ALPRAZolam  Assesment:she had a knee replacement. She has some history of chronic bronchitis she still having a little bit of cough. She is somewhat short of breath. She has incentive spirometry ordered but does not seem to be receiving it. Active Problems:  * No active hospital problems. *     Plan:I added nebulizer treatments I have discussed her situation with the respiratory therapy department and they're going to Get started with incentive spirometry. I have encouraged her to sit up. Thank you for allowing me to see her with you    LOS: 1 day   [place] Kuang L 05/04/2011, 8:55 PM

## 2011-05-04 NOTE — Progress Notes (Signed)
Physical Therapy Evaluation Patient Name: [name] Date: 05/04/2011 Problem List:  Patient Active Problem List  Diagnoses  . KNEE, ARTHRITIS, DEGEN./OSTEO  . FOOT PAIN  . TOTAL KNEE FOLLOW-UP  . ABSCESS, SKIN   Past Medical History:  Past Medical History  Diagnosis Date  . Hyperlipidemia   . Hypothyroidism   . Anxiety   . Depression   . Arthritis   . Chronic bronchitis   . Blood transfusion   . Constipation    Past Surgical History:  Past Surgical History  Procedure Date  . Tubal ligation   . Joint replacement 05/2010    left total knee Dr. Romeo Apple  . Total thyroidectomy March 2011    Dr. Suszanne Conners @ [facility]  . Left hip replacement @ [facility] '98   . Left total knee     Precautions/Restrictions  Precautions Precautions: Knee Precaution Booklet Issued: No (has one from previous TKR) Required Braces or Orthoses: No Restrictions Weight Bearing Restrictions: No RLE Weight Bearing: Weight bearing as tolerated Other Position/Activity Restrictions: no pillow under R knee Prior Functioning  Home Living Type of Home: House Lives With: Alone Receives Help From: Family;Neighbor Home Layout: One level Home Access: Ramped entrance Bathroom Shower/Tub: Engineer, manufacturing systems: Handicapped height Bathroom Accessibility: Yes How Accessible: Accessible via walker Home Adaptive Equipment: Grab bars in shower;Hand-held shower hose;Shower chair without back;Straight cane;Tub transfer bench;Walker - rolling;Wheelchair - manual Prior Function Level of Independence: Independent with basic ADLs;Requires assistive device for independence;Independent with transfers;Independent with gait;Independent with homemaking with ambulation Driving: No Vocation: Retired Producer, television/film/video: Awake/alert Overall Cognitive Status: Appears within functional limits for tasks assessed Orientation Level: Oriented X4 Sensation/Coordination Sensation Light Touch:  Appears Intact Proprioception: Appears Intact Extremity Assessment RUE Assessment RUE Assessment: Within Functional Limits LUE Assessment LUE Assessment: Within Functional Limits RLE Assessment RLE Assessment: Exceptions to [facility] RLE PROM (degrees) Right Knee Extension 0-130: -6  Right Knee Flexion 0-140: 66  LLE Assessment LLE Assessment: Exceptions to WFL LLE PROM (degrees) Left Knee Extension 0-130: -22 LLE Strength LLE Overall Strength: Deficits (generally 3-/5) Mobility (including Balance) Bed Mobility Bed Mobility: Yes Supine to Sit: 2: Max assist;HOB elevated (Comment degrees) (HOB at 60 degrees) Sitting - Scoot to Edge of Bed: With rail;3: Mod assist Transfers Transfers: Yes Sit to Stand: 2: Max assist;From elevated surface;With upper extremity assist;From bed (unable to achieve stance-3 tries) Sit to Stand Details (indicate cue type and reason): LLE strength is decreased from prior TKR Stand Pivot Transfers: From elevated surface Stand Pivot Transfer Details (indicate cue type and reason): extremely difficult transfer due to pt's weight Ambulation/Gait Ambulation/Gait: No (unable) Stairs: No Wheelchair Mobility Wheelchair Mobility: No  Posture/Postural Control Posture/Postural Control: No significant limitations Balance Balance Assessed: No Exercise  Total Joint Exercises Ankle Circles/Pumps: AROM;Both;10 reps;Supine Quad Sets: AROM;Both;10 reps;Supine Short Arc Quad: AAROM;Right;10 reps;Supine Heel Slides: AAROM;Right;10 reps;Supine Knee Flexion: AAROM;Right;5 reps;Seated General Exercises - Lower Extremity Ankle Circles/Pumps: AROM;Both;10 reps;Supine Quad Sets: AROM;Both;10 reps;Supine Short Arc Quad: AAROM;Right;10 reps;Supine Heel Slides: AAROM;Right;10 reps;Supine Low Level/ICU Exercises Ankle Circles/Pumps: AROM;Both;10 reps;Supine Quad Sets: AROM;Both;10 reps;Supine Short Arc Quad: AAROM;Right;10 reps;Supine Heel Slides: AAROM;Right;10  reps;Supine  End of Session PT - End of Session Equipment Utilized During Treatment: Gait belt Activity Tolerance: Patient tolerated treatment well;Patient limited by fatigue Patient left: in chair;with call bell in reach (cryocuff and foot pumps in place) Nurse Communication: Mobility status for transfers General Behavior During Session: [facility] for tasks performed Cognition: [facility] for tasks performed PT Assessment/Plan/Recommendation PT Assessment  Clinical Impression Statement: obese, very cooperative and pleasant pt with very limited mobility after TKR surgery. Will nee SNF PT Recommendation/Assessment: Patient will need skilled PT in the acute care venue PT Problem List: Decreased strength;Decreased range of motion;Decreased activity tolerance;Decreased mobility;Decreased knowledge of use of DME;Decreased knowledge of precautions;Obesity Barriers to Discharge: Decreased caregiver support PT Therapy Diagnosis : Difficulty walking;Acute pain;Abnormality of gait PT Plan PT Frequency: Min 5X/week PT Treatment/Interventions: DME instruction;Gait training;Therapeutic exercise;Patient/family education PT Recommendation Follow Up Recommendations: Skilled nursing facility Equipment Recommended: Defer to next venue PT Goals  Acute Rehab PT Goals PT Goal Formulation: With patient Time For Goal Achievement: 7 days Pt will go Sit to Supine/Side: with mod assist Pt will Transfer Sit to Stand/Stand to Sit: with max assist (and achieve full stance) Pt will Transfer Bed to Chair/Chair to Bed: with max assist Pt will Ambulate: 1 - 15 feet;with max assist;with rolling walker Myrlene Broker L 05/04/2011, 9:55 AM

## 2011-05-04 NOTE — Anesthesia Postprocedure Evaluation (Signed)
  Anesthesia Post-op Note  Patient: [name]  Procedure(s) Performed:  TOTAL KNEE ARTHROPLASTY - With DePuy  Patient Location: Room 312  Anesthesia Type: Spinal  Level of Consciousness: awake, alert , oriented and patient cooperative  Airway and Oxygen Therapy: Patient Spontanous Breathing  Post-op Pain: 5 /10, moderate  Post-op Assessment: Post-op Vital signs reviewed, Patient's Cardiovascular Status Stable, Respiratory Function Stable, Patent Airway, No signs of Nausea or vomiting and Adequate PO intake  Post-op Vital Signs: stable  Complications: No apparent anesthesia complications

## 2011-05-04 NOTE — Patient Instructions (Signed)
Pt instructed in positioning of R knee to facilitate full knee ROM.  No pillow under knee at rest, bridging of knee when possible

## 2011-05-04 NOTE — Addendum Note (Signed)
Addendum  created 05/04/11 0748 by Despina Hidden   Modules edited:Notes Section

## 2011-05-05 LAB — CBC
MCH: 29.5 pg (ref 26.0–34.0)
MCV: 89.2 fL (ref 78.0–100.0)
Platelets: 161 10*3/uL (ref 150–400)
RDW: 13.1 % (ref 11.5–15.5)
WBC: 11.6 10*3/uL — ABNORMAL HIGH (ref 4.0–10.5)

## 2011-05-05 MED ORDER — ALPRAZOLAM 1 MG PO TABS
1.0000 mg | ORAL_TABLET | Freq: Three times a day (TID) | ORAL | Status: DC | PRN
  Administered 2011-05-05: 1 mg via ORAL
  Filled 2011-05-05: qty 1

## 2011-05-05 NOTE — Progress Notes (Signed)
Subjective: 2 Days Post-Op Procedure(s) (LRB): TOTAL KNEE ARTHROPLASTY (Right) Patient reports pain as mild.    Objective: Vital signs in last 24 hours: Temp:  [97.7 F (36.5 C)-99.6 F (37.6 C)] 99.4 F (37.4 C) (08/08 0637) Pulse Rate:  [61-89] 89  (08/08 0637) Resp:  [18-20] 19  (08/08 0637) BP: (133-153)/(49-73) 137/73 mmHg (08/08 0637) SpO2:  [82 %-100 %] 95 % (08/08 0808) Weight:  [114.306 kg (252 lb)] 252 lb (114.306 kg) (08/08 0308)  Intake/Output from previous day: 08/07 0701 - 08/08 0700 In: 1444 [P.O.:450; I.V.:994] Out: 100 [Urine:100] Intake/Output this shift: I/O this shift: In: 180 [P.O.:180] Out: -    Basename 05/05/11 0524 05/04/11 0502  HGB 10.1* 11.2*    Basename 05/05/11 0524 05/04/11 0502  WBC 11.6* 10.0  RBC 3.42* 3.85*  HCT 30.5* 34.3*  PLT 161 196    Basename 05/04/11 0502  NA 139  K 3.7  CL 104  CO2 24  BUN 13  CREATININE 0.69  GLUCOSE 141*  CALCIUM 8.5   No results found for this basename: LABPT:2,INR:2 in the last 72 hours  Neurologically intact Neurovascular intact Sensation intact distally Intact pulses distally Dorsiflexion/Plantar flexion intact Incision: mild sanguinous drainage, multiple blisters   Assessment/Plan: 2 Days Post-Op Procedure(s) (LRB): TOTAL KNEE ARTHROPLASTY (Right) Advance diet Up with therapy  [name] 05/05/2011, 9:13 AM

## 2011-05-05 NOTE — Progress Notes (Signed)
  Physical Therapy Treatment Patient Name: [name] [id number] Date: 05/05/2011  TIME: [id number] Charges: 1 There Ex 1Ta Problem List:  Patient Active Problem List  Diagnoses  . KNEE, ARTHRITIS, DEGEN./OSTEO   Past Medical History:  Past Medical History  Diagnosis Date  . Hyperlipidemia   . Hypothyroidism   . Anxiety   . Depression   . Arthritis   . Chronic bronchitis   . Blood transfusion   . Constipation    Past Surgical History:  Past Surgical History  Procedure Date  . Tubal ligation   . Joint replacement 05/2010    left total knee Dr. Romeo Apple  . Total thyroidectomy March 2011    Dr. Suszanne Conners @ [facility]  . Left hip replacement @ [facility] '98   . Left total knee    Precautions/Restrictions  Precautions Precautions: Knee Precaution Booklet Issued: No (has one from previous TKR) Required Braces or Orthoses: No Restrictions Weight Bearing Restrictions: Yes RLE Weight Bearing: Non weight bearing Other Position/Activity Restrictions: no pillow under R knee Mobility (including Balance) Bed Mobility Supine to Sit: 3: Mod assist Supine to Sit Details (indicate cue type and reason): Assistance needed with RLE Sitting - Scoot to Edge of Bed: 4: Min assist Sitting - Scoot to [place] of Bed Details (indicate cue type and reason): Vc's for weight shifitng and assistance with RLE Sit to Supine - Right: 3: Mod assist Sit to Supine - Right Details (indicate cue type and reason): assistance needed with LE's Transfers Sit to Stand: 2: Max assist;From bed;From elevated surface;With upper extremity assist Stand Pivot Transfers: From elevated surface Stand Pivot Transfer Details (indicate cue type and reason): pt able to pivot to recliner today Ambulation/Gait Ambulation/Gait: No Stairs: No Wheelchair Mobility Wheelchair Mobility: No    Exercise  Total Joint Exercises Ankle Circles/Pumps: Both;10 reps Quad Sets: Right;10 reps Short Arc Quad: Right;10 reps;AAROM Heel Slides:  AAROM;Right;10 reps General Exercises - Lower Extremity Ankle Circles/Pumps: Both;10 reps Quad Sets: Right;10 reps Short Arc Quad: Right;10 reps;AAROM Heel Slides: AAROM;Right;10 reps Low Level/ICU Exercises Ankle Circles/Pumps: Both;10 reps Quad Sets: Right;10 reps Short Arc Quad: Right;10 reps;AAROM Heel Slides: AAROM;Right;10 reps  End of Session PT - End of Session Equipment Utilized During Treatment: Gait belt (RW) Activity Tolerance: Patient tolerated treatment well Patient left: in bed;in CPM Nurse Communication:  (notfied nursing that several leads were unattached) General Behavior During Session: [facility] for tasks performed Cognition: [facility] for tasks performed PT Assessment/Plan  PT - Assessment/Plan Comments on Treatment Session: ROM -6 degrees ext/ 84 degrees flex;CPM increased to 0-70 degrees PT Goals  Acute Rehab PT Goals PT Goal: Sit to Supine/Side - Progress: Progressing toward goal PT Transfer Goal: Sit to Stand/Stand to Sit - Progress: Progressing toward goal PT Transfer Goal: Bed to Chair/Chair to Bed - Progress: Progressing toward goal  Uriah Philipson ATKINSO 05/05/2011, 12:44 PM

## 2011-05-05 NOTE — Progress Notes (Signed)
Physical Therapy Treatment Patient Name: [name] Date: 05/05/2011  TIME: [phone] CHARGES: 1 There Ex 1 TA Problem List:  Patient Active Problem List  Diagnoses  . KNEE, ARTHRITIS, DEGEN./OSTEO   Past Medical History:  Past Medical History  Diagnosis Date  . Hyperlipidemia   . Hypothyroidism   . Anxiety   . Depression   . Arthritis   . Chronic bronchitis   . Blood transfusion   . Constipation    Past Surgical History:  Past Surgical History  Procedure Date  . Tubal ligation   . Joint replacement 05/2010    left total knee Dr. Romeo Apple  . Total thyroidectomy March 2011    Dr. Suszanne Conners @ [facility]  . Left hip replacement @ [facility] '98   . Left total knee    Precautions/Restrictions  Precautions Precautions: Knee Precaution Booklet Issued: No (has one from previous TKR) Required Braces or Orthoses: No Restrictions Weight Bearing Restrictions: Yes RLE Weight Bearing: Weight bearing as tolerated Other Position/Activity Restrictions: no pillow under R knee Mobility (including Balance) Bed Mobility Supine to Sit: 3: Mod assist Supine to Sit Details (indicate cue type and reason): Assistance needed with RLE Sitting - Scoot to Edge of Bed: 4: Min assist Sitting - Scoot to [place] of Bed Details (indicate cue type and reason): Vc's for weight shifitng and assistance with RLE Transfers Sit to Stand: 2: Max assist;From bed;From elevated surface;With upper extremity assist Stand Pivot Transfers: From elevated surface Stand Pivot Transfer Details (indicate cue type and reason): pt able to pivot to recliner today Ambulation/Gait Ambulation/Gait: No Stairs: No Wheelchair Mobility Wheelchair Mobility: No    Exercise  Total Joint Exercises Ankle Circles/Pumps: Both;10 reps Quad Sets: Both;10 reps Short Arc Quad: AAROM;10 reps;Right (pt able to assist 20%) Heel Slides: AAROM;Right;10 reps General Exercises - Lower Extremity Ankle Circles/Pumps: Both;10 reps Quad  Sets: Both;10 reps Short Arc Quad: AAROM;10 reps;Right (pt able to assist 20%) Heel Slides: AAROM;Right;10 reps Low Level/ICU Exercises Ankle Circles/Pumps: Both;10 reps Quad Sets: Both;10 reps Short Arc Quad: AAROM;10 reps;Right (pt able to assist 20%) Heel Slides: AAROM;Right;10 reps  End of Session PT - End of Session Equipment Utilized During Treatment: Gait belt Activity Tolerance: Patient tolerated treatment well Patient left: in chair;with call bell in reach (with ice and R knee in elevated and extended) Nurse Communication:  (notfied nursing that several leads were unattached) General Behavior During Session: [facility] for tasks performed Cognition: [facility] for tasks performed PT Assessment/Plan  PT - Assessment/Plan Comments on Treatment Session: Pt did very well with all treatment today;was able to pivot transfer to chair;pain level stayed below at 3 or below during session PT Goals  Acute Rehab PT Goals PT Goal: Sit to Supine/Side - Progress: Progressing toward goal PT Transfer Goal: Sit to Stand/Stand to Sit - Progress: Progressing toward goal PT Transfer Goal: Bed to Chair/Chair to Bed - Progress: Progressing toward goal  [name] 05/05/2011, 10:50 AM

## 2011-05-05 NOTE — Progress Notes (Signed)
Subjective: She says she feels better. She still has trouble sleeping in the hospital bed. She is doing incentive around a tree and taking nebulizer treatments now.  Objective: Vital signs in last 24 hours: Temp:  [97.7 F (36.5 C)-99.6 F (37.6 C)] 99.4 F (37.4 C) (08/08 0637) Pulse Rate:  [61-89] 89  (08/08 0637) Resp:  [18-20] 19  (08/08 0637) BP: (133-153)/(49-73) 137/73 mmHg (08/08 0637) SpO2:  [82 %-100 %] 95 % (08/08 0808) Weight:  [114.306 kg (252 lb)] 252 lb (114.306 kg) (08/08 0308) Weight change:  Last BM Date: 05/01/11 (receiving stool softener per orders)  Intake/Output from previous day: 08/07 0701 - 08/08 0700 In: 1444 [P.O.:450; I.V.:994] Out: 100 [Urine:100]  PHYSICAL EXAM General appearance: alert, cooperative, appears stated age and morbidly obese Resp: clear to auscultation bilaterally Cardio: regular rate and rhythm, S1, S2 normal, no murmur, click, rub or gallop GI: soft, non-tender; bowel sounds normal; no masses,  no organomegaly Extremities: Her knee looks okay postop  Lab Results:    Basic Metabolic Panel:  Basename 05/04/11 0502  NA 139  K 3.7  CL 104  CO2 24  GLUCOSE 141*  BUN 13  CREATININE 0.69  CALCIUM 8.5  MG --  PHOS --   Liver Function Tests: No results found for this basename: AST:2,ALT:2,ALKPHOS:2,BILITOT:2,PROT:2,ALBUMIN:2 in the last 72 hours No results found for this basename: LIPASE:2,AMYLASE:2 in the last 72 hours CBC:  Basename 05/05/11 0524 05/04/11 0502  WBC 11.6* 10.0  NEUTROABS -- --  HGB 10.1* 11.2*  HCT 30.5* 34.3*  MCV 89.2 89.1  PLT 161 196   Cardiac Enzymes: No results found for this basename: CKTOTAL:3,CKMB:3,CKMBINDEX:3,TROPONINI:3 in the last 72 hours BNP: No results found for this basename: POCBNP:3 in the last 72 hours D-Dimer: No results found for this basename: DDIMER:2 in the last 72 hours CBG: No results found for this basename: GLUCAP:6 in the last 72 hours Hemoglobin A1C: No results  found for this basename: HGBA1C in the last 72 hours Fasting Lipid Panel: No results found for this basename: CHOL,HDL,LDLCALC,TRIG,CHOLHDL,LDLDIRECT in the last 72 hours Thyroid Function Tests: No results found for this basename: TSH,T4TOTAL,FREET4,T3FREE,THYROIDAB in the last 72 hours Anemia Panel: No results found for this basename: VITAMINB12,FOLATE,FERRITIN,TIBC,IRON,RETICCTPCT in the last 72 hours Urine Drug Screen:  Urinalysis:  Misc. Labs:  ABGS No results found for this basename: PHART,PCO2,PO2ART,TCO2,HCO3 in the last 72 hours CULTURES Recent Results (from the past 240 hour(s))  SURGICAL PCR SCREEN     Status: Normal   Collection Time   04/28/11 11:15 AM      Component Value Range Status Comment   MRSA, PCR NEGATIVE  NEGATIVE  Final    Staphylococcus aureus NEGATIVE  NEGATIVE  Final    Studies/Results: Dg Knee Right Port  05/03/2011  *RADIOLOGY REPORT*  Clinical Data: Postop right knee replacement  PORTABLE RIGHT KNEE - 1-2 VIEW  Comparison: None  Findings: There are immediate postoperative changes of total right knee replacement. The knee is aligned.  The hardware appears well seated.  There is expected subcutaneous and joint gas.  Seen on the lateral projection is a 1.4 x 0.7 cm bone fragment in the anterior subcutaneous tissues, at the level of the superior most skin stable.  IMPRESSION:  1.  1.4 cm bony fragment is seen on the lateral view in the subcutaneous tissues anteriorly as described above.  No preoperative radiographs for comparison. 2.  Total right hip arthroplasty.  Original Report Authenticated By: Britta Mccreedy, M.D.    Medications:  Prior to Admission:  Prescriptions prior to admission  Medication Sig Dispense Refill  . ALPRAZolam (XANAX) 0.25 MG tablet Take 0.25 mg by mouth at bedtime as needed. For anxiety      . clotrimazole (LOTRIMIN) 1 % cream Apply topically 2 (two) times daily between meals as needed. For rash       . furosemide (LASIX) 40 MG tablet        . HYDROcodone-acetaminophen (NORCO) 10-325 MG per tablet 1 tablet every 4 (four) hours as needed. For pain      . levothyroxine (SYNTHROID, LEVOTHROID) 150 MCG tablet       . PARoxetine (PAXIL) 20 MG tablet       . ibuprofen (ADVIL,MOTRIN) 200 MG tablet Take 200 mg by mouth every 6 (six) hours as needed. For pain        Scheduled:   . ipratropium  0.5 mg Nebulization Q4H   And  . albuterol  2.5 mg Nebulization Q4H  . aspirin  325 mg Oral BID  . docusate sodium  100 mg Oral BID  . furosemide  40 mg Oral Daily  . levothyroxine  150 mcg Oral QAC breakfast  . PARoxetine  20 mg Oral Daily  . sodium chloride       Continuous:   . sodium chloride 20 mL/hr at 05/05/11 0600  . bupivacaine ON-Q pain pump     ZOX:[id number], acetaminophen, ALPRAZolam, alum & mag hydroxide-simeth, aluminum hydroxide, bisacodyl, bisacodyl, clotrimazole, HYDROcodone-acetaminophen, HYDROmorphone, magnesium hydroxide, menthol-cetylpyridinium, methocarbamol(ROBAXIN) IV, methocarbamol, metoCLOPramide (REGLAN) injection, metoCLOPramide, ondansetron, oxyCODONE, phenol, polyethylene glycol, sodium phosphate, DISCONTD: ALPRAZolam  Assesment: She had knee replacement surgery. She is doing okay. She does have some problems with chronic bronchitis and she was somewhat hypoxic. She's better with incentive spirometry and nebulizer treatments. She also has anxiety and depression and hypothyroidism. Active Problems:  KNEE, ARTHRITIS, DEGEN./OSTEO    Plan: Continue current treatments.    LOS: 2 days   Rhett Mutschler L 05/05/2011, 8:57 AM

## 2011-05-05 NOTE — Progress Notes (Signed)
SNF bed offered and accepted at [facility] [place]-anticipate d/c tomorrow if stable. Pt. pleased with this facility and location-

## 2011-05-06 ENCOUNTER — Inpatient Hospital Stay
Admission: RE | Admit: 2011-05-06 | Discharge: 2011-05-26 | Disposition: A | Discharge status: Home / Self Care | Payer: Self-pay | Source: Ambulatory Visit | Attending: Pulmonary Disease | Admitting: Pulmonary Disease

## 2011-05-06 DIAGNOSIS — R52 Pain, unspecified: Principal | ICD-10-CM

## 2011-05-06 LAB — CBC
HCT: 26.5 % — ABNORMAL LOW (ref 36.0–46.0)
Hemoglobin: 8.9 g/dL — ABNORMAL LOW (ref 12.0–15.0)
MCHC: 33.6 g/dL (ref 30.0–36.0)
MCV: 88 fL (ref 78.0–100.0)

## 2011-05-06 MED ORDER — DSS 100 MG PO CAPS: 100.0000 mg | ORAL_CAPSULE | Freq: Two times a day (BID) | ORAL | Status: AC

## 2011-05-06 MED ORDER — MAGNESIUM HYDROXIDE 400 MG/5ML PO SUSP: 30.0000 mL | Freq: Two times a day (BID) | ORAL | Status: AC | PRN

## 2011-05-06 MED ORDER — HYDROCODONE-ACETAMINOPHEN 10-325 MG PO TABS: 1.0000 | ORAL_TABLET | ORAL | Status: AC | PRN

## 2011-05-06 MED ORDER — POLYETHYLENE GLYCOL 3350 17 G PO PACK: 17.0000 g | PACK | Freq: Every day | ORAL | Status: AC | PRN

## 2011-05-06 MED ORDER — METHOCARBAMOL 500 MG PO TABS: 500.0000 mg | ORAL_TABLET | Freq: Four times a day (QID) | ORAL | Status: AC | PRN

## 2011-05-06 MED ORDER — ALUM & MAG HYDROXIDE-SIMETH 200-200-20 MG/5ML PO SUSP: 30.0000 mL | ORAL | Status: AC | PRN

## 2011-05-06 MED ORDER — ASPIRIN 325 MG PO TBEC: 325.0000 mg | DELAYED_RELEASE_TABLET | Freq: Two times a day (BID) | ORAL | Status: AC

## 2011-05-06 MED ORDER — BISACODYL 5 MG PO TBEC: 10.0000 mg | DELAYED_RELEASE_TABLET | Freq: Every day | ORAL | Status: AC | PRN

## 2011-05-06 NOTE — Consult Note (Signed)
Pt d/c today by MD to [facility].  Pt and facility aware and agreeable. Pt to transfer with RN and sign herself in to facility.    [name]

## 2011-05-06 NOTE — Progress Notes (Signed)
Pt discharged to [facility] today per Dr. Romeo Apple. Pt's IV site D/C'd and WNL. Pt's VS stable at this time. Report called to Clydie Braun, Nursing Director at [facility]. Pt left floor via hospital bed accompanied by Evette Cristal, NT and Ishmael Holter, NT in stable condition. Dagoberto Ligas, RN

## 2011-05-06 NOTE — Discharge Summary (Signed)
Physician Discharge Summary  Patient ID: [name] MRN: [id number] DOB/AGE: 03-16-1933 77 y.o.  Admit date: 05/03/2011 Discharge date: 05/06/2011  Admission Diagnoses: Osteoarthritis right knee  Discharge Diagnoses: Osteoarthritis right knee Active Problems:  KNEE, ARTHRITIS, DEGEN./OSTEO   Discharged Condition: good  Hospital Course: The patient was admitted on August 6 with a diagnosis of osteoarthritis of the right knee she underwent an uncomplicated right total knee arthroplasty with a Depew stigma fixed-bearing posterior stabilized knee  Consults: Dr. Juanetta Gosling on the medical service, primary care physician consult on the patient routine postop consult  Significant Diagnostic Studies: labs: hg 8.9  Treatments: procedures: RT TKA, Dr Romeo Apple on Aug 6th   Discharge Exam: Blood pressure 115/67, pulse 104, temperature 98.8 F (37.1 C), temperature source Oral, resp. rate 16, height 5\' 9"  (1.753 m), weight 114.306 kg (252 lb), SpO2 93.00%. Extremities: edema right leg , Homans sign is negative, no sign of DVT and  Incision/Wound:clean , however there are blister which I released and there is a lateral posterior subQ and intardermal hematoma which is being treated with zeroform and gauze dressing   Disposition:   Discharge Orders    Future Appointments: Provider: Department: Dept Phone: Center:   05/17/2011 3:00 PM Fuller Canada, MD [facility] [phone] [facility]     Future Orders Please Complete By Expires   Diet - low sodium heart healthy      Call MD / Call 911      Comments:   If you experience chest pain or shortness of breath, CALL 911 and be transported to the hospital emergency room.  If you develope a fever above 101 F, pus (white drainage) or increased drainage or redness at the wound, or calf pain, call your surgeon's office.   Constipation Prevention      Comments:   Drink plenty of fluids.  Prune juice may be helpful.  You may use a stool softener,  such as Colace (over the counter) 100 mg twice a day.  Use MiraLax (over the counter) for constipation as needed.   Increase activity slowly as tolerated      Weight Bearing as taught in Physical Therapy      Comments:   Use a walker or crutches as instructed.   CPM      Comments:   Continuous passive motion machine (CPM):      Use the CPM from 0 to 65 for 8 hours per day.      You may increase by 10 per day.  You may break it up into 2 or 3 sessions per day.      Use CPM for 3 weeks or until you are told to stop.   TED hose      Comments:   Use stockings (TED hose) for 5 weeks on left leg(s).  You may remove them at night for sleeping.    Change dressing      Comments:   Wrap right leg and dress as follows: telfa on incision, zeroform on blisters, kerlix arounf knee, 4 inch ace at foot/ankle then 2 6 in ace wraps up to thigh    Do not put a pillow under the knee. Place it under the heel.        Current Discharge Medication List    START taking these medications   Details  alum & mag hydroxide-simeth (MAALOX/MYLANTA) 200-200-20 MG/5ML suspension Take 30 mLs by mouth every 4 (four) hours as needed for indigestion. Qty: 355 mL, Refills: 0  aspirin EC 325 MG EC tablet Take 1 tablet (325 mg total) by mouth 2 (two) times daily. Qty: 70 tablet, Refills: 0    bisacodyl (DULCOLAX) 5 MG EC tablet Take 2 tablets (10 mg total) by mouth daily as needed for constipation. Qty: 30 tablet, Refills: 0    docusate sodium 100 MG CAPS Take 100 mg by mouth 2 (two) times daily. Qty: 60 capsule, Refills: 0    magnesium hydroxide (MILK OF MAGNESIA) 400 MG/5ML suspension Take 30 mLs by mouth every 12 (twelve) hours as needed for constipation. Qty: 360 mL, Refills: 0    methocarbamol (ROBAXIN) 500 MG tablet Take 1 tablet (500 mg total) by mouth every 6 (six) hours as needed. Qty: 60 tablet, Refills: 0    polyethylene glycol (MIRALAX / GLYCOLAX) packet Take 17 g by mouth daily as needed. Qty: 30  each, Refills: 0      CONTINUE these medications which have CHANGED   Details  HYDROcodone-acetaminophen (NORCO) 10-325 MG per tablet Take 1-2 tablets by mouth every 4 (four) hours as needed. Qty: 60 tablet, Refills: 5      CONTINUE these medications which have NOT CHANGED   Details  ALPRAZolam (XANAX) 0.25 MG tablet Take 0.25 mg by mouth at bedtime as needed. For anxiety    clotrimazole (LOTRIMIN) 1 % cream Apply topically 2 (two) times daily between meals as needed. For rash     furosemide (LASIX) 40 MG tablet     levothyroxine (SYNTHROID, LEVOTHROID) 150 MCG tablet     PARoxetine (PAXIL) 20 MG tablet       STOP taking these medications     ibuprofen (ADVIL,MOTRIN) 200 MG tablet        Follow-up Information    Follow up with Fuller Canada, MD. Call on 05/17/2011.   Contact information:   [address] [place] [address] [phone]          Signed: Fuller Canada 05/06/2011, 7:43 AM

## 2011-05-06 NOTE — Progress Notes (Signed)
Subjective: He feels much better. She's been able to do more physical therapy and is very pleased. She is planning to be transferred to a skilled care facility today and I think is appropriate she is not having problems with shortness of breath now.  Objective: Vital signs in last 24 hours: Temp:  [98.4 F (36.9 C)-99.8 F (37.7 C)] 98.8 F (37.1 C) (08/09 0509) Pulse Rate:  [94-104] 104  (08/09 0509) Resp:  [16-18] 16  (08/09 0509) BP: (115-132)/(65-67) 115/67 mmHg (08/09 0509) SpO2:  [91 %-97 %] 94 % (08/09 1143) Weight change:  Last BM Date: 05/02/11  Intake/Output from previous day: 08/08 0701 - 08/09 0700 In: 1020 [P.O.:540; I.V.:480] Out: -   PHYSICAL EXAM General appearance: alert, cooperative, no distress and morbidly obese Resp: clear to auscultation bilaterally Cardio: regular rate and rhythm, S1, S2 normal, no murmur, click, rub or gallop GI: soft, non-tender; bowel sounds normal; no masses,  no organomegaly Extremities: she had surgery on her knee and that seems to be doing okay  Lab Results:    Basic Metabolic Panel:  Basename 05/04/11 0502  NA 139  K 3.7  CL 104  CO2 24  GLUCOSE 141*  BUN 13  CREATININE 0.69  CALCIUM 8.5  MG --  PHOS --   Liver Function Tests: No results found for this basename: AST:2,ALT:2,ALKPHOS:2,BILITOT:2,PROT:2,ALBUMIN:2 in the last 72 hours No results found for this basename: LIPASE:2,AMYLASE:2 in the last 72 hours CBC:  Basename 05/06/11 0514 05/05/11 0524  WBC 10.4 11.6*  NEUTROABS -- --  HGB 8.9* 10.1*  HCT 26.5* 30.5*  MCV 88.0 89.2  PLT 161 161   Cardiac Enzymes: No results found for this basename: CKTOTAL:3,CKMB:3,CKMBINDEX:3,TROPONINI:3 in the last 72 hours BNP: No results found for this basename: POCBNP:3 in the last 72 hours D-Dimer: No results found for this basename: DDIMER:2 in the last 72 hours CBG: No results found for this basename: GLUCAP:6 in the last 72 hours Hemoglobin A1C: No results found  for this basename: HGBA1C in the last 72 hours Fasting Lipid Panel: No results found for this basename: CHOL,HDL,LDLCALC,TRIG,CHOLHDL,LDLDIRECT in the last 72 hours Thyroid Function Tests: No results found for this basename: TSH,T4TOTAL,FREET4,T3FREE,THYROIDAB in the last 72 hours Anemia Panel: No results found for this basename: VITAMINB12,FOLATE,FERRITIN,TIBC,IRON,RETICCTPCT in the last 72 hours Urine Drug Screen:  Urinalysis:  Misc. Labs:  ABGS No results found for this basename: PHART,PCO2,PO2ART,TCO2,HCO3 in the last 72 hours CULTURES Recent Results (from the past 240 hour(s))  SURGICAL PCR SCREEN     Status: Normal   Collection Time   04/28/11 11:15 AM      Component Value Range Status Comment   MRSA, PCR NEGATIVE  NEGATIVE  Final    Staphylococcus aureus NEGATIVE  NEGATIVE  Final    Studies/Results: No results found.  Medications:  Prior to Admission:  No prescriptions prior to admission   Scheduled:   . ipratropium  0.5 mg Nebulization Q4H   And  . albuterol  2.5 mg Nebulization Q4H  . aspirin  325 mg Oral BID  . docusate sodium  100 mg Oral BID  . furosemide  40 mg Oral Daily  . levothyroxine  150 mcg Oral QAC breakfast  . PARoxetine  20 mg Oral Daily   Continuous:   . sodium chloride 20 mL/hr at 05/06/11 0600   ZOX:[id number], acetaminophen, ALPRAZolam, alum & mag hydroxide-simeth, aluminum hydroxide, bisacodyl, bisacodyl, clotrimazole, HYDROcodone-acetaminophen, HYDROmorphone, magnesium hydroxide, menthol-cetylpyridinium, methocarbamol(ROBAXIN) IV, methocarbamol, metoCLOPramide (REGLAN) injection, metoCLOPramide, ondansetron, oxyCODONE, phenol, polyethylene glycol,  sodium phosphate  Assesment:she had knee replacement and that has gone well. She had some problems with shortness of breath following that and does have some chronic bronchitis. That has improved. She has morbid obesity. Active Problems:  KNEE, ARTHRITIS, DEGEN./OSTEO    Plan:she is being  transferred to skilled care facility today and I will follow her at the skilled care facility    LOS: 3 days   Donovan Persley L 05/06/2011, 1:49 PM

## 2011-05-06 NOTE — Progress Notes (Signed)
Subjective: 3 Days Post-Op Procedure(s) (LRB): TOTAL KNEE ARTHROPLASTY (Right) Patient reports pain as 3 on 0-10 scale.    Objective: Vital signs in last 24 hours: Temp:  [98.4 F (36.9 C)-99.8 F (37.7 C)] 98.8 F (37.1 C) (08/09 0509) Pulse Rate:  [94-104] 104  (08/09 0509) Resp:  [16-18] 16  (08/09 0509) BP: (115-132)/(65-67) 115/67 mmHg (08/09 0509) SpO2:  [91 %-97 %] 93 % (08/09 0509)  Intake/Output from previous day: 08/08 0701 - 08/09 0700 In: 1020 [P.O.:540; I.V.:480] Out: -  Intake/Output this shift:     Basename 05/06/11 0514 05/05/11 0524 05/04/11 0502  HGB 8.9* 10.1* 11.2*    Basename 05/06/11 0514 05/05/11 0524  WBC 10.4 11.6*  RBC 3.01* 3.42*  HCT 26.5* 30.5*  PLT 161 161    Basename 05/04/11 0502  NA 139  K 3.7  CL 104  CO2 24  BUN 13  CREATININE 0.69  GLUCOSE 141*  CALCIUM 8.5   No results found for this basename: LABPT:2,INR:2 in the last 72 hours  Neurologically intact Neurovascular intact Sensation intact distally Intact pulses distally Dorsiflexion/Plantar flexion intact Incision: no drainage blisters distal to incision and behind incision  Assessment/Plan: 3 Days Post-Op Procedure(s) (LRB): TOTAL KNEE ARTHROPLASTY (Right) Discharge to SNF  [facility] 05/06/2011, 7:18 AM

## 2011-05-08 NOTE — H&P (Signed)
NAME:  [name], [name]           ACCOUNT NO.:  [id number]  MEDICAL RECORD NO.:  [id number]  LOCATION:  S157                          FACILITY:  [facility]  PHYSICIAN:  Alasdair Kleve L. Juanetta Gosling, M.D.DATE OF BIRTH:  April 05, 1933  DATE OF ADMISSION:  05/06/2011 DATE OF DISCHARGE:  LH                             HISTORY & PHYSICAL   HISTORY OF PRESENT ILLNESS:  Patient at the [facility].  [name] is admitted to the [facility] after having had a knee replacement.  She has generally been in fair health.  She has had a lot of problems with back pain.  She has had problems with anxiety and depression and morbid obesity.  She has chronic bronchitis as well.  She had her knee replacement and was sent to the [facility] for rehabilitation.  PAST MEDICAL HISTORY:  As mentioned is positive for morbid obesity, for chronic bronchitis.  SOCIAL HISTORY:  She is a widow.  She is a nonsmoker.  She does not use any alcohol.  She lives at home alone.  FAMILY HISTORY:  Positive for cardiac disease, the extent of which is unknown.  REVIEW OF SYSTEMS:  Except as mentioned is negative.  PHYSICAL EXAMINATION:  GENERAL:  A well-developed, well-nourished obese female who is in no acute distress. VITAL SIGNS:  As recorded. HEENT:  Her pupils are reactive.  Nose and throat are clear.  Mucous membranes are moist. NECK:  Supple without masses. CHEST:  Clear without wheezes, rales, or rhonchi. HEART:  Regular without murmur, gallop, or rub. ABDOMEN:  Soft and obese.  No masses felt. EXTREMITIES: She has had a knee replacement. CENTRAL NERVOUS SYSTEM:  Grossly intact.  ASSESSMENT:  She has had a knee replacement.  She is undergoing treatment for that.  Plan is for her to continue her rehabilitation and continue her meds.     Amara Manalang L. Juanetta Gosling, M.D.     ELH/MEDQ  D:  05/08/2011  T:  05/08/2011  Job:  [id number]

## 2011-05-09 LAB — TYPE AND SCREEN
ABO/RH(D): O POS
Antibody Screen: NEGATIVE
Unit division: 0

## 2011-05-13 ENCOUNTER — Encounter (HOSPITAL_COMMUNITY): Payer: Self-pay | Admitting: Orthopedic Surgery

## 2011-05-13 ENCOUNTER — Ambulatory Visit (INDEPENDENT_AMBULATORY_CARE_PROVIDER_SITE_OTHER): Payer: Medicare Other | Admitting: Orthopedic Surgery

## 2011-05-13 DIAGNOSIS — Z96659 Presence of unspecified artificial knee joint: Secondary | ICD-10-CM | POA: Insufficient documentation

## 2011-05-13 NOTE — Progress Notes (Signed)
Postop visit status post total knee arthroplasty Date of surgery  May 03, 2011 Diagnosis  Osteoarthritis Operative findings Valgus osteoarthritis Implant Depuy  posterior stabilized total knee replacement DVT prophylaxis Ecotrin twice a day with TED hose for 6 weeks  Living situation [facility] Complaints Brought in today because of pain when she stood up from a seated position Exam Ecchymosis around the knee moderate amount of swelling.  Wound looks clean.  Flexion-extension gaps stable.  Collateral ligaments stable.  Patient can extend the knee to 30.  Does not appear to have extensor mechanism disruption. Plan Staples were removed every other one, resumed physical therapy and CPM machine come back on Monday to get the remaining staples out

## 2011-05-17 ENCOUNTER — Ambulatory Visit (INDEPENDENT_AMBULATORY_CARE_PROVIDER_SITE_OTHER): Payer: Medicare Other | Admitting: Orthopedic Surgery

## 2011-05-17 DIAGNOSIS — Z96659 Presence of unspecified artificial knee joint: Secondary | ICD-10-CM

## 2011-05-17 NOTE — Progress Notes (Signed)
Postop visit status post  Right  total knee arthroplasty  Date of surgery May 03, 2011  Diagnosis Osteoarthritis  Operative findings Valgus osteoarthritis  Implant Depuy posterior stabilized total knee replacement  DVT prophylaxis Ecotrin twice a day with TED hose for 6 weeks  Living situation [facility]  Complaints none   The areas that were blistered have closed and are healed.  Her knee looks good.  Her skin looks good in terms of the incision.  She does have mild swelling.  Return in 4 weeks.  Advance therapy as tolerated, may go home when appropriate

## 2011-05-20 ENCOUNTER — Ambulatory Visit (HOSPITAL_COMMUNITY): Payer: Medicare Other | Attending: Pulmonary Disease

## 2011-05-20 ENCOUNTER — Other Ambulatory Visit: Payer: Medicare Other | Admitting: Orthopedic Surgery

## 2011-05-20 DIAGNOSIS — Z96659 Presence of unspecified artificial knee joint: Secondary | ICD-10-CM | POA: Insufficient documentation

## 2011-05-20 DIAGNOSIS — M25569 Pain in unspecified knee: Secondary | ICD-10-CM | POA: Insufficient documentation

## 2011-05-26 ENCOUNTER — Telehealth: Payer: Self-pay | Admitting: Orthopedic Surgery

## 2011-05-26 NOTE — Telephone Encounter (Signed)
Kathlene November, physical therapist, ph [phone], called re: CPM order.  Still needed?  Total knee surgery was 05/03/11.  Patient's next appointment here is 06/15/11.  Please advise.

## 2011-05-27 NOTE — Telephone Encounter (Signed)
I returned call and relayed per Dr. Mort Sawyers response.

## 2011-05-27 NOTE — Telephone Encounter (Signed)
No it says 3 weeks

## 2011-05-29 ENCOUNTER — Emergency Department (HOSPITAL_COMMUNITY): Payer: Medicare Other

## 2011-05-29 ENCOUNTER — Inpatient Hospital Stay (HOSPITAL_COMMUNITY)
Admission: EM | Admit: 2011-05-29 | Discharge: 2011-06-09 | DRG: 488 | Disposition: A | Discharge status: Home Health | Payer: Medicare Other | Attending: Orthopedic Surgery | Admitting: Orthopedic Surgery

## 2011-05-29 DIAGNOSIS — T8131XA Disruption of external operation (surgical) wound, not elsewhere classified, initial encounter: Secondary | ICD-10-CM | POA: Diagnosis present

## 2011-05-29 DIAGNOSIS — L03119 Cellulitis of unspecified part of limb: Secondary | ICD-10-CM | POA: Diagnosis present

## 2011-05-29 DIAGNOSIS — IMO0002 Reserved for concepts with insufficient information to code with codable children: Secondary | ICD-10-CM | POA: Diagnosis present

## 2011-05-29 DIAGNOSIS — S86819A Strain of other muscle(s) and tendon(s) at lower leg level, unspecified leg, initial encounter: Secondary | ICD-10-CM | POA: Diagnosis present

## 2011-05-29 DIAGNOSIS — L02619 Cutaneous abscess of unspecified foot: Secondary | ICD-10-CM | POA: Diagnosis present

## 2011-05-29 DIAGNOSIS — D62 Acute posthemorrhagic anemia: Secondary | ICD-10-CM | POA: Diagnosis not present

## 2011-05-29 DIAGNOSIS — T8149XA Infection following a procedure, other surgical site, initial encounter: Secondary | ICD-10-CM

## 2011-05-29 DIAGNOSIS — I872 Venous insufficiency (chronic) (peripheral): Secondary | ICD-10-CM | POA: Diagnosis present

## 2011-05-29 DIAGNOSIS — Y831 Surgical operation with implant of artificial internal device as the cause of abnormal reaction of the patient, or of later complication, without mention of misadventure at the time of the procedure: Secondary | ICD-10-CM | POA: Diagnosis present

## 2011-05-29 DIAGNOSIS — M66269 Spontaneous rupture of extensor tendons, unspecified lower leg: Principal | ICD-10-CM | POA: Diagnosis present

## 2011-05-29 LAB — DIFFERENTIAL
Eosinophils Relative: 0 % (ref 0–5)
Lymphocytes Relative: 5 % — ABNORMAL LOW (ref 12–46)
Lymphs Abs: 1 10*3/uL (ref 0.7–4.0)
Monocytes Absolute: 1 10*3/uL (ref 0.1–1.0)
Monocytes Relative: 5 % (ref 3–12)

## 2011-05-29 LAB — SEDIMENTATION RATE: Sed Rate: 44 mm/hr — ABNORMAL HIGH (ref 0–22)

## 2011-05-29 LAB — CBC
HCT: 38.1 % (ref 36.0–46.0)
Hemoglobin: 12.1 g/dL (ref 12.0–15.0)
MCV: 93.6 fL (ref 78.0–100.0)
RBC: 4.07 MIL/uL (ref 3.87–5.11)
WBC: 20.6 10*3/uL — ABNORMAL HIGH (ref 4.0–10.5)

## 2011-05-29 LAB — BASIC METABOLIC PANEL
CO2: 29 mEq/L (ref 19–32)
Calcium: 9.2 mg/dL (ref 8.4–10.5)
Creatinine, Ser: 0.72 mg/dL (ref 0.50–1.10)
Glucose, Bld: 134 mg/dL — ABNORMAL HIGH (ref 70–99)

## 2011-05-29 MED ORDER — ACETAMINOPHEN 650 MG RE SUPP: 650.0000 mg | Freq: Four times a day (QID) | RECTAL | Status: DC | PRN

## 2011-05-29 MED ORDER — METHOCARBAMOL 500 MG PO TABS
500.0000 mg | ORAL_TABLET | Freq: Four times a day (QID) | ORAL | Status: DC | PRN
  Administered 2011-06-02 – 2011-06-05 (×4): 500 mg via ORAL
  Filled 2011-05-29 (×4): qty 1

## 2011-05-29 MED ORDER — POLYETHYLENE GLYCOL 3350 17 G PO PACK: 17.0000 g | PACK | Freq: Every day | ORAL | Status: DC | PRN

## 2011-05-29 MED ORDER — METOCLOPRAMIDE HCL 5 MG/ML IJ SOLN: 5.0000 mg | Freq: Three times a day (TID) | INTRAMUSCULAR | Status: DC | PRN

## 2011-05-29 MED ORDER — ONDANSETRON HCL 4 MG PO TABS: 4.0000 mg | ORAL_TABLET | Freq: Four times a day (QID) | ORAL | Status: DC | PRN

## 2011-05-29 MED ORDER — METHOCARBAMOL 100 MG/ML IJ SOLN
500.0000 mg | Freq: Four times a day (QID) | INTRAVENOUS | Status: DC | PRN
  Filled 2011-05-29: qty 5

## 2011-05-29 MED ORDER — LEVOTHYROXINE SODIUM 75 MCG PO TABS
150.0000 ug | ORAL_TABLET | ORAL | Status: DC
  Administered 2011-05-30 – 2011-06-09 (×10): 150 ug via ORAL
  Filled 2011-05-29 (×2): qty 1
  Filled 2011-05-29 (×3): qty 2
  Filled 2011-05-29 (×2): qty 1
  Filled 2011-05-29 (×7): qty 2

## 2011-05-29 MED ORDER — METOCLOPRAMIDE HCL 10 MG PO TABS: 5.0000 mg | ORAL_TABLET | Freq: Three times a day (TID) | ORAL | Status: DC | PRN

## 2011-05-29 MED ORDER — HYDROCODONE-ACETAMINOPHEN 10-325 MG PO TABS
1.0000 | ORAL_TABLET | Freq: Four times a day (QID) | ORAL | Status: DC | PRN
  Administered 2011-05-30: 1 via ORAL
  Administered 2011-05-31 – 2011-06-04 (×6): 2 via ORAL
  Filled 2011-05-29 (×2): qty 2
  Filled 2011-05-29: qty 1
  Filled 2011-05-29 (×4): qty 2

## 2011-05-29 MED ORDER — VANCOMYCIN HCL 1000 MG IV SOLR
1500.0000 mg | INTRAVENOUS | Status: DC
  Administered 2011-05-30 – 2011-06-01 (×3): 1500 mg via INTRAVENOUS
  Filled 2011-05-29 (×3): qty 1500

## 2011-05-29 MED ORDER — FUROSEMIDE 40 MG PO TABS
40.0000 mg | ORAL_TABLET | Freq: Every day | ORAL | Status: DC
  Administered 2011-05-29 – 2011-06-09 (×12): 40 mg via ORAL
  Filled 2011-05-29 (×11): qty 1

## 2011-05-29 MED ORDER — ALUMINUM HYDROXIDE GEL 600 MG/5ML PO SUSP
15.0000 mL | ORAL | Status: DC | PRN
  Filled 2011-05-29: qty 30

## 2011-05-29 MED ORDER — PHENOL 1.4 % MT LIQD: 1.0000 | OROMUCOSAL | Status: DC | PRN

## 2011-05-29 MED ORDER — MAGNESIUM HYDROXIDE 400 MG/5ML PO SUSP: 30.0000 mL | Freq: Two times a day (BID) | ORAL | Status: DC | PRN

## 2011-05-29 MED ORDER — METHOCARBAMOL 500 MG PO TABS
500.0000 mg | ORAL_TABLET | Freq: Four times a day (QID) | ORAL | Status: DC
  Administered 2011-05-29 – 2011-06-09 (×37): 500 mg via ORAL
  Filled 2011-05-29 (×35): qty 1

## 2011-05-29 MED ORDER — DOCUSATE SODIUM 100 MG PO CAPS
100.0000 mg | ORAL_CAPSULE | Freq: Two times a day (BID) | ORAL | Status: DC
  Administered 2011-05-29 – 2011-06-04 (×13): 100 mg via ORAL
  Filled 2011-05-29 (×13): qty 1

## 2011-05-29 MED ORDER — ALPRAZOLAM 0.25 MG PO TABS
0.2500 mg | ORAL_TABLET | Freq: Three times a day (TID) | ORAL | Status: DC
  Administered 2011-05-29 – 2011-06-09 (×32): 0.25 mg via ORAL
  Filled 2011-05-29 (×31): qty 1

## 2011-05-29 MED ORDER — VANCOMYCIN HCL IN DEXTROSE 1-5 GM/200ML-% IV SOLN
1000.0000 mg | Freq: Once | INTRAVENOUS | Status: AC
  Administered 2011-05-29: 1000 mg via INTRAVENOUS
  Filled 2011-05-29: qty 200

## 2011-05-29 MED ORDER — ACETAMINOPHEN 325 MG PO TABS
650.0000 mg | ORAL_TABLET | Freq: Four times a day (QID) | ORAL | Status: DC | PRN
  Administered 2011-05-30: 650 mg via ORAL
  Filled 2011-05-29: qty 2

## 2011-05-29 MED ORDER — VANCOMYCIN HCL IN DEXTROSE 1-5 GM/200ML-% IV SOLN
INTRAVENOUS | Status: AC
  Filled 2011-05-29: qty 200

## 2011-05-29 MED ORDER — ONDANSETRON HCL 4 MG/2ML IJ SOLN: 4.0000 mg | Freq: Four times a day (QID) | INTRAMUSCULAR | Status: DC | PRN

## 2011-05-29 MED ORDER — ALUM & MAG HYDROXIDE-SIMETH 200-200-20 MG/5ML PO SUSP: 30.0000 mL | ORAL | Status: DC | PRN

## 2011-05-29 MED ORDER — SODIUM CHLORIDE 0.9 % IV SOLN
INTRAVENOUS | Status: DC
  Administered 2011-05-29 – 2011-06-01 (×5): via INTRAVENOUS
  Administered 2011-06-02: 950 mL via INTRAVENOUS
  Administered 2011-06-02 – 2011-06-04 (×4): via INTRAVENOUS

## 2011-05-29 MED ORDER — FLEET ENEMA 7-19 GM/118ML RE ENEM: 1.0000 | ENEMA | Freq: Every day | RECTAL | Status: DC | PRN

## 2011-05-29 MED ORDER — DIPHENHYDRAMINE HCL 12.5 MG/5ML PO ELIX: 12.5000 mg | ORAL_SOLUTION | ORAL | Status: DC | PRN

## 2011-05-29 MED ORDER — MENTHOL 3 MG MT LOZG: 1.0000 | LOZENGE | OROMUCOSAL | Status: DC | PRN

## 2011-05-29 MED ORDER — OXYCODONE HCL 5 MG PO TABS
5.0000 mg | ORAL_TABLET | ORAL | Status: DC | PRN
  Administered 2011-05-29 – 2011-05-30 (×2): 10 mg via ORAL
  Administered 2011-05-30: 5 mg via ORAL
  Administered 2011-05-31: 10 mg via ORAL
  Administered 2011-06-03 (×2): 5 mg via ORAL
  Administered 2011-06-04: 10 mg via ORAL
  Administered 2011-06-04 – 2011-06-06 (×2): 5 mg via ORAL
  Administered 2011-06-07 (×3): 10 mg via ORAL
  Administered 2011-06-08: 5 mg via ORAL
  Filled 2011-05-29: qty 2
  Filled 2011-05-29: qty 1
  Filled 2011-05-29: qty 2
  Filled 2011-05-29 (×2): qty 1
  Filled 2011-05-29: qty 2
  Filled 2011-05-29 (×3): qty 1
  Filled 2011-05-29 (×4): qty 2

## 2011-05-29 MED ORDER — PAROXETINE HCL 20 MG PO TABS
20.0000 mg | ORAL_TABLET | Freq: Every evening | ORAL | Status: DC
  Administered 2011-05-29 – 2011-06-08 (×11): 20 mg via ORAL
  Filled 2011-05-29 (×11): qty 1

## 2011-05-29 MED ORDER — HYDROMORPHONE HCL 1 MG/ML IJ SOLN
0.5000 mg | INTRAMUSCULAR | Status: DC | PRN
  Administered 2011-05-31: 0.5 mg via INTRAVENOUS
  Administered 2011-06-01 – 2011-06-05 (×8): 1 mg via INTRAVENOUS
  Filled 2011-05-29 (×9): qty 1

## 2011-05-29 MED ORDER — BISACODYL 5 MG PO TBEC
10.0000 mg | DELAYED_RELEASE_TABLET | Freq: Every day | ORAL | Status: DC | PRN
  Administered 2011-06-07: 10 mg via ORAL
  Filled 2011-05-29: qty 2

## 2011-05-29 MED ORDER — BISACODYL 10 MG RE SUPP: 10.0000 mg | Freq: Every day | RECTAL | Status: DC | PRN

## 2011-05-29 MED ORDER — HYDROCODONE-ACETAMINOPHEN 5-325 MG PO TABS
2.0000 | ORAL_TABLET | Freq: Once | ORAL | Status: AC
  Administered 2011-05-29: 2 via ORAL
  Filled 2011-05-29: qty 2

## 2011-05-29 NOTE — ED Notes (Signed)
Pt req pain med for knee. edp aware

## 2011-05-29 NOTE — Progress Notes (Signed)
ANTIBIOTIC CONSULT NOTE - INITIAL  Pharmacy Consult for Vancomycin Indication: Recent Knee Surgery - Drainage / Dehisence  Patient Measurements: Weight: 247 lb 12.8 oz (112.401 kg)  Vital Signs: Temp: 100.3 F (37.9 C) (09/01 1846) Temp src: Oral (09/01 1846) BP: 119/45 mmHg (09/01 1846) Pulse Rate: 96  (09/01 1846) Intake/Output from previous day:   Intake/Output from this shift:    Labs:  Basename 05/29/11 1250  WBC 20.6*  HGB 12.1  PLT 320  LABCREA --  CREATININE 0.72  CRCLEARANCE --   No results found for this basename: VANCOTROUGH:2,VANCOPEAK:2,VANCORANDOM:2,GENTTROUGH:2,GENTPEAK:2,GENTRANDOM:2,TOBRATROUGH:2,TOBRAPEAK:2,TOBRARND:2,AMIKACINPEAK:2,AMIKACINTROU:2,AMIKACIN:2, in the last 72 hours   Microbiology: No results found for this or any previous visit (from the past 720 hour(s)).  Medical History: Past Medical History  Diagnosis Date  . Thyroid disease   . Depression     Medications:  Prescriptions prior to admission  Medication Sig Dispense Refill  . ALPRAZolam (XANAX) 0.25 MG tablet Take 0.25 mg by mouth 3 (three) times daily. For anxiety      . furosemide (LASIX) 40 MG tablet Take 40 mg by mouth daily.        Marland Kitchen HYDROcodone-acetaminophen (NORCO) 10-325 MG per tablet Take 1-2 tablets by mouth every 6 (six) hours as needed. For pain      . levothyroxine (SYNTHROID, LEVOTHROID) 150 MCG tablet Take 150 mcg by mouth every morning.        . methocarbamol (ROBAXIN) 500 MG tablet Take 500 mg by mouth every 6 (six) hours. For muscle spasms      . PARoxetine (PAXIL) 20 MG tablet Take 20 mg by mouth every evening.         Assessment: Okay for Protocol No height in computer yet.  Goal of Therapy:  Vancomycin trough level 15-20 mcg/ml  Plan:  Measure antibiotic drug levels at steady state Total Vancomycin 2gm tonight, then 1500mg  IV every 24 hours.  [name] 05/29/2011,8:27 PM

## 2011-05-29 NOTE — ED Notes (Signed)
Rt knee surgery on August 6th. Pt states her incision is starting to "split open". Pt came from home. drsg over incision site with serous drainage present on top of drsg.

## 2011-05-29 NOTE — H&P (Signed)
  Wednesday 05/26/2011 wound opened  Fri advance home care nurse called to tell doctor wound opened, the rest of the knee looked ok  Sat patients daughter called to say patient had chills  I sent her to Er   WBC 20K   rec admit start IV vanc

## 2011-05-29 NOTE — ED Provider Notes (Signed)
History     CSN: [id number] Arrival date & time: 05/29/2011 12:34 PM  Chief Complaint  Patient presents with  . Wound Check   Patient is a 77 y.o. female presenting with wound check and knee pain. The history is provided by the patient and a relative.  Wound Check   Knee Pain  Pt had a right TKR done on 8/6 by Dr Romeo Apple. She was in rehab and was discharged 3 days ago. States she had two falls because of collapsing stool when getting in/out of tub and toilet. States she is currently not doing PT and isn't walking, she is using a wheelchair. She states her home health nurse came out yesterday and packed her knee. During the night it started draining. She denies pain or fever.       Past Medical History  Diagnosis Date  . Thyroid disease   . Depression   anxiety  Past Surgical History  Procedure Date  . Total hip arthroplasty   . Replacement total knee bilateral     No family history on file.  History  Substance Use Topics  . Smoking status: Never Smoker   . Smokeless tobacco: Not on file  . Alcohol Use: No  lives alone  OB History    Grav Para Term Preterm Abortions TAB SAB Ect Mult Living                  Review of Systems  All other systems reviewed and are negative.    Physical Exam  BP 141/44  Pulse 88  Temp(Src) 100.4 F (38 C) (Oral)  Resp 22  SpO2 94%  Physical Exam  Constitutional: She is oriented to person, place, and time. She appears well-developed and well-nourished.  HENT:  Head: Normocephalic.  Eyes: Conjunctivae and EOM are normal. Pupils are equal, round, and reactive to light.  Pulmonary/Chest: Effort normal.  Musculoskeletal:       Pt has a midline incision on her right anterior knee. Her whole right knee is swollen, hot to touch with redness. She has an area of dehiscence in the center of her incision and has purosanginous drainage on her sheet.   Neurological: She is alert and oriented to person, place, and time. No cranial nerve  deficit.  Skin: Skin is warm and dry.  Psychiatric: Her speech is normal. Her affect is blunt.    ED Course  Procedures  MDM   Results for orders placed during the hospital encounter of 05/29/11  CBC      Component Value Range   WBC 20.6 (*) 4.0 - 10.5 (K/uL)   RBC 4.07  3.87 - 5.11 (MIL/uL)   Hemoglobin 12.1  12.0 - 15.0 (g/dL)   HCT 96.2  95.2 - 84.1 (%)   MCV 93.6  78.0 - 100.0 (fL)   MCH 29.7  26.0 - 34.0 (pg)   MCHC 31.8  30.0 - 36.0 (g/dL)   RDW 32.4 (*) 40.1 - 15.5 (%)   Platelets 320  150 - 400 (K/uL)  DIFFERENTIAL      Component Value Range   Neutrophils Relative 90 (*) 43 - 77 (%)   Neutro Abs 18.6 (*) 1.7 - 7.7 (K/uL)   Lymphocytes Relative 5 (*) 12 - 46 (%)   Lymphs Abs 1.0  0.7 - 4.0 (K/uL)   Monocytes Relative 5  3 - 12 (%)   Monocytes Absolute 1.0  0.1 - 1.0 (K/uL)   Eosinophils Relative 0  0 - 5 (%)  Eosinophils Absolute 0.0  0.0 - 0.7 (K/uL)   Basophils Relative 0  0 - 1 (%)   Basophils Absolute 0.0  0.0 - 0.1 (K/uL)  BASIC METABOLIC PANEL      Component Value Range   Sodium 137  135 - 145 (mEq/L)   Potassium 3.8  3.5 - 5.1 (mEq/L)   Chloride 100  96 - 112 (mEq/L)   CO2 29  19 - 32 (mEq/L)   Glucose, Bld 134 (*) 70 - 99 (mg/dL)   BUN 19  6 - 23 (mg/dL)   Creatinine, Ser 4.78  0.50 - 1.10 (mg/dL)   Calcium 9.2  8.4 - 29.5 (mg/dL)   GFR calc non Af Amer >60  >60 (mL/min)   GFR calc Af Amer >60  >60 (mL/min)   Leukocytosis with hyperglycemia   Dg Knee Complete 4 Views Right  05/29/2011  *RADIOLOGY REPORT*  Clinical Data: Complaint of right knee pain and swelling.  Open surgical site with prior healing drainage.  Total knee replacement May 13, 2011.  RIGHT KNEE - COMPLETE 4+ VIEW  Comparison: No prior radiographs of the right knee for comparison.  Findings: There are postsurgical changes of total right knee arthroplasty.  No evidence of hardware loosening.  There is prominence of the soft tissues inferior to the patella/anterior to the knee joint.   Small joint effusion is suspected.  There is a 14 mm by 7 mm bony density in the soft tissues anterior to the distal third of the femur.  This is likely a fractured and displaced fragment from the distal femur, and there appears to be a donor site along the the anterior aspect of the femur just proximal to the hardware ( best seen on the lateral view).  A second smaller bony fragment is seen, projecting over the distal femur on the lateral view.  IMPRESSION:  1.  Prominence of the soft tissues inferior to the patella and anterior to the, which could reflect soft tissue infection given the clinical history. Suspect small suprapatellar joint effusion.  2.  14 mm bony fragment in the soft tissues of the anterior leg at the level the distal femoral shaft and a small bony fragment projecting over the distal femur in the lateral projection.  The donor sites are likely the anterior cortex of the distal femur just proximal to the hardware of the arthroplasty.  Original Report Authenticated By: Britta Mccreedy, M.D.    5:14 PMDr Romeo Apple is going to admit, he wants to call in antibiotics.   Ward Givens, MD 05/29/11 1715

## 2011-05-30 ENCOUNTER — Encounter (HOSPITAL_COMMUNITY): Payer: Self-pay | Admitting: Orthopedic Surgery

## 2011-05-30 LAB — C-REACTIVE PROTEIN: CRP: 20.27 mg/dL — ABNORMAL HIGH (ref <0.60)

## 2011-05-30 LAB — DIFFERENTIAL
Basophils Absolute: 0 10*3/uL (ref 0.0–0.1)
Lymphocytes Relative: 6 % — ABNORMAL LOW (ref 12–46)
Lymphs Abs: 1 10*3/uL (ref 0.7–4.0)
Monocytes Absolute: 0.8 10*3/uL (ref 0.1–1.0)
Neutro Abs: 14.7 10*3/uL — ABNORMAL HIGH (ref 1.7–7.7)

## 2011-05-30 LAB — CBC
HCT: 32.4 % — ABNORMAL LOW (ref 36.0–46.0)
Hemoglobin: 10.3 g/dL — ABNORMAL LOW (ref 12.0–15.0)
RBC: 3.51 MIL/uL — ABNORMAL LOW (ref 3.87–5.11)
RDW: 15.8 % — ABNORMAL HIGH (ref 11.5–15.5)
WBC: 16.5 10*3/uL — ABNORMAL HIGH (ref 4.0–10.5)

## 2011-05-30 LAB — BASIC METABOLIC PANEL
Calcium: 8.2 mg/dL — ABNORMAL LOW (ref 8.4–10.5)
Chloride: 99 mEq/L (ref 96–112)
Creatinine, Ser: 0.61 mg/dL (ref 0.50–1.10)
GFR calc Af Amer: >60 mL/min (ref >60)

## 2011-05-30 MED ORDER — POTASSIUM CHLORIDE CRYS ER 20 MEQ PO TBCR
20.0000 meq | EXTENDED_RELEASE_TABLET | Freq: Two times a day (BID) | ORAL | Status: DC
  Administered 2011-05-30 – 2011-06-09 (×21): 20 meq via ORAL
  Filled 2011-05-30 (×20): qty 1

## 2011-05-30 MED ORDER — POTASSIUM CHLORIDE CRYS ER 20 MEQ PO TBCR
40.0000 meq | EXTENDED_RELEASE_TABLET | Freq: Once | ORAL | Status: AC
  Administered 2011-05-30: 40 meq via ORAL
  Filled 2011-05-30: qty 1

## 2011-05-30 NOTE — H&P (Signed)
[name] is an 77 y.o. female.   Chief Complaint: Drainage from right knee HPI: 77 year old female status post right total knee arthroplasty approximately 3 weeks ago presents with drainage of the wound. The patient noted drainage on Wednesday. She was seen by a therapist who advised that the doctor should see the wound. The primary care physician was called and advised the nurse to come out to check the wound. The nurse checked the wound on Friday. The nurse reported a 5 mm area of separation of the wound at the midpole with sanguinous drainage. Erythema was noted at the site of the opening but nowhere else in the wound. The wound was packed with saline. On Saturday the patient was having chills and was advised to come to the hospital where was found that she had an elevated white count of 20,000. The low-grade temperature. She was admitted to the hospital for evaluation and management. The pain was mild with no increase from her time in the nursing home. The patient says that she didn't tell anyone about it at first, but then became ill with shaking chills and noted to her daughter who called me and she was thus advised to go to the emergency room at that time.   Past Medical History  Diagnosis Date  . Thyroid disease   . Depression     Past Surgical History  Procedure Date  . Total hip arthroplasty   . Replacement total knee bilateral     History reviewed. No pertinent family history. Social History:  reports that she has never smoked. She has never used smokeless tobacco. She reports that she does not drink alcohol or use illicit drugs.  Allergies: No Known Allergies  Medications Prior to Admission  Medication Dose Route Frequency Nirali Magouirk Last Rate Last Dose  . 0.9 %  sodium chloride infusion   Intravenous Continuous Ward Givens, MD 100 mL/hr at 05/30/11 0545    . acetaminophen (TYLENOL) tablet 650 mg  650 mg Oral Q6H PRN Fuller Canada, MD       Or  . acetaminophen  (TYLENOL) suppository 650 mg  650 mg Rectal Q6H PRN Fuller Canada, MD      . ALPRAZolam Prudy Feeler) tablet 0.25 mg  0.25 mg Oral TID Fuller Canada, MD   0.25 mg at 05/29/11 2118  . alum & mag hydroxide-simeth (MAALOX/MYLANTA) 200-200-20 MG/5ML suspension 30 mL  30 mL Oral Q4H PRN Fuller Canada, MD      . aluminum hydroxide (ALTERNGEL) suspension 15-30 mL  15-30 mL Oral Q4H PRN Fuller Canada, MD      . bisacodyl (DULCOLAX) EC tablet 10 mg  10 mg Oral Daily PRN Fuller Canada, MD       Or  . bisacodyl (DULCOLAX) suppository 10 mg  10 mg Rectal Daily PRN Fuller Canada, MD      . diphenhydrAMINE (BENADRYL) 12.5 MG/5ML elixir 12.5-25 mg  12.5-25 mg Oral Q4H PRN Fuller Canada, MD      . docusate sodium (COLACE) capsule 100 mg  100 mg Oral BID Fuller Canada, MD   100 mg at 05/29/11 2118  . furosemide (LASIX) tablet 40 mg  40 mg Oral Daily Fuller Canada, MD   40 mg at 05/29/11 2118  . HYDROcodone-acetaminophen (NORCO) 10-325 MG per tablet 1-2 tablet  1-2 tablet Oral Q6H PRN Fuller Canada, MD      . HYDROcodone-acetaminophen (NORCO) 5-325 MG per tablet 2 tablet  2 tablet Oral Once Ward Givens, MD   2 tablet  at 05/29/11 1713  . HYDROmorphone (DILAUDID) injection 0.5-1 mg  0.5-1 mg Intravenous Q2H PRN Fuller Canada, MD      . levothyroxine (SYNTHROID, LEVOTHROID) tablet 150 mcg  150 mcg Oral QAM Fuller Canada, MD   150 mcg at 05/30/11 0544  . magnesium hydroxide (MILK OF MAGNESIA) suspension 30 mL  30 mL Oral Q12H PRN Fuller Canada, MD      . menthol-cetylpyridinium (CEPACOL) lozenge 3 mg  1 lozenge Oral PRN Fuller Canada, MD       Or  . phenol (CHLORASEPTIC) mouth spray 1 spray  1 spray Mouth/Throat PRN Fuller Canada, MD      . methocarbamol (ROBAXIN) tablet 500 mg  500 mg Oral Q6H PRN Fuller Canada, MD       Or  . methocarbamol (ROBAXIN) 500 mg in dextrose 5 % 50 mL IVPB  500 mg Intravenous Q6H PRN Fuller Canada, MD      . methocarbamol (ROBAXIN) tablet 500  mg  500 mg Oral Q6H Fuller Canada, MD   500 mg at 05/30/11 0545  . metoCLOPramide (REGLAN) tablet 5-10 mg  5-10 mg Oral Q8H PRN Fuller Canada, MD       Or  . metoCLOPramide (REGLAN) injection 5-10 mg  5-10 mg Intravenous Q8H PRN Fuller Canada, MD      . ondansetron [facility]) tablet 4 mg  4 mg Oral Q6H PRN Fuller Canada, MD       Or  . ondansetron [facility]) injection 4 mg  4 mg Intravenous Q6H PRN Fuller Canada, MD      . oxyCODONE (Oxy IR/ROXICODONE) immediate release tablet 5-10 mg  5-10 mg Oral Q3H PRN Fuller Canada, MD   10 mg at 05/30/11 0548  . PARoxetine (PAXIL) tablet 20 mg  20 mg Oral QPM Fuller Canada, MD   20 mg at 05/29/11 2118  . polyethylene glycol (MIRALAX / GLYCOLAX) packet 17 g  17 g Oral Daily PRN Fuller Canada, MD      . sodium phosphate (FLEET) 7-19 GM/118ML enema 1 enema  1 enema Rectal Daily PRN Fuller Canada, MD      . vancomycin (VANCOCIN) 1,500 mg in sodium chloride 0.9 % 500 mL IVPB  1,500 mg Intravenous Q24H Mady Gemma, PHARMD      . vancomycin (VANCOCIN) IVPB 1000 mg/200 mL premix  1,000 mg Intravenous Once Mady Gemma, PHARMD   1,000 mg at 05/29/11 2117  . vancomycin (VANCOCIN) IVPB 1000 mg/200 mL premix  1,000 mg Intravenous Once Mady Gemma, PHARMD   1,000 mg at 05/29/11 2204   No current outpatient prescriptions on file as of 05/30/2011.    Results for orders placed during the hospital encounter of 05/29/11 (from the past 48 hour(s))  CBC     Status: Abnormal   Collection Time   05/29/11 12:50 PM      Component Value Range Comment   WBC 20.6 (*) 4.0 - 10.5 (K/uL)    RBC 4.07  3.87 - 5.11 (MIL/uL)    Hemoglobin 12.1  12.0 - 15.0 (g/dL)    HCT 16.1  09.6 - 04.5 (%)    MCV 93.6  78.0 - 100.0 (fL)    MCH 29.7  26.0 - 34.0 (pg)    MCHC 31.8  30.0 - 36.0 (g/dL)    RDW 40.9 (*) 81.1 - 15.5 (%)    Platelets 320  150 - 400 (K/uL)   DIFFERENTIAL     Status: Abnormal   Collection Time   05/29/11  12:50 PM      Component Value Range  Comment   Neutrophils Relative 90 (*) 43 - 77 (%)    Neutro Abs 18.6 (*) 1.7 - 7.7 (K/uL)    Lymphocytes Relative 5 (*) 12 - 46 (%)    Lymphs Abs 1.0  0.7 - 4.0 (K/uL)    Monocytes Relative 5  3 - 12 (%)    Monocytes Absolute 1.0  0.1 - 1.0 (K/uL)    Eosinophils Relative 0  0 - 5 (%)    Eosinophils Absolute 0.0  0.0 - 0.7 (K/uL)    Basophils Relative 0  0 - 1 (%)    Basophils Absolute 0.0  0.0 - 0.1 (K/uL)   BASIC METABOLIC PANEL     Status: Abnormal   Collection Time   05/29/11 12:50 PM      Component Value Range Comment   Sodium 137  135 - 145 (mEq/L)    Potassium 3.8  3.5 - 5.1 (mEq/L)    Chloride 100  96 - 112 (mEq/L)    CO2 29  19 - 32 (mEq/L)    Glucose, Bld 134 (*) 70 - 99 (mg/dL)    BUN 19  6 - 23 (mg/dL)    Creatinine, Ser 1.61  0.50 - 1.10 (mg/dL)    Calcium 9.2  8.4 - 10.5 (mg/dL)    GFR calc non Af Amer >60  >60 (mL/min)    GFR calc Af Amer >60  >60 (mL/min)   WOUND CULTURE     Status: Normal (Preliminary result)   Collection Time   05/29/11  2:20 PM      Component Value Range Comment   Specimen Description LEG      Special Requests Normal      Gram Stain PENDING      Culture NO GROWTH 1 DAY      Report Status PENDING     SEDIMENTATION RATE     Status: Abnormal   Collection Time   05/29/11  6:06 PM      Component Value Range Comment   Sed Rate 44 (*) 0 - 22 (mm/hr)   BASIC METABOLIC PANEL     Status: Abnormal   Collection Time   05/30/11  5:47 AM      Component Value Range Comment   Sodium 134 (*) 135 - 145 (mEq/L)    Potassium 3.2 (*) 3.5 - 5.1 (mEq/L)    Chloride 99  96 - 112 (mEq/L)    CO2 27  19 - 32 (mEq/L)    Glucose, Bld 120 (*) 70 - 99 (mg/dL)    BUN 17  6 - 23 (mg/dL)    Creatinine, Ser 0.96  0.50 - 1.10 (mg/dL)    Calcium 8.2 (*) 8.4 - 10.5 (mg/dL)    GFR calc non Af Amer >60  >60 (mL/min)    GFR calc Af Amer >60  >60 (mL/min)   CBC     Status: Abnormal   Collection Time   05/30/11  5:47 AM      Component Value Range Comment   WBC 16.5 (*) 4.0 -  10.5 (K/uL)    RBC 3.51 (*) 3.87 - 5.11 (MIL/uL)    Hemoglobin 10.3 (*) 12.0 - 15.0 (g/dL)    HCT 04.5 (*) 40.9 - 46.0 (%)    MCV 92.3  78.0 - 100.0 (fL)    MCH 29.3  26.0 - 34.0 (pg)    MCHC 31.8  30.0 - 36.0 (g/dL)  RDW 15.8 (*) 11.5 - 15.5 (%)    Platelets 271  150 - 400 (K/uL)   DIFFERENTIAL     Status: Abnormal   Collection Time   05/30/11  5:47 AM      Component Value Range Comment   Neutrophils Relative 89 (*) 43 - 77 (%)    Neutro Abs 14.7 (*) 1.7 - 7.7 (K/uL)    Lymphocytes Relative 6 (*) 12 - 46 (%)    Lymphs Abs 1.0  0.7 - 4.0 (K/uL)    Monocytes Relative 5  3 - 12 (%)    Monocytes Absolute 0.8  0.1 - 1.0 (K/uL)    Eosinophils Relative 0  0 - 5 (%)    Eosinophils Absolute 0.0  0.0 - 0.7 (K/uL)    Basophils Relative 0  0 - 1 (%)    Basophils Absolute 0.0  0.0 - 0.1 (K/uL)    Dg Knee Complete 4 Views Right  05/29/2011  *RADIOLOGY REPORT*  Clinical Data: Complaint of right knee pain and swelling.  Open surgical site with prior healing drainage.  Total knee replacement May 13, 2011.  RIGHT KNEE - COMPLETE 4+ VIEW  Comparison: No prior radiographs of the right knee for comparison.  Findings: There are postsurgical changes of total right knee arthroplasty.  No evidence of hardware loosening.  There is prominence of the soft tissues inferior to the patella/anterior to the knee joint.  Small joint effusion is suspected.  There is a 14 mm by 7 mm bony density in the soft tissues anterior to the distal third of the femur.  This is likely a fractured and displaced fragment from the distal femur, and there appears to be a donor site along the the anterior aspect of the femur just proximal to the hardware ( best seen on the lateral view).  A second smaller bony fragment is seen, projecting over the distal femur on the lateral view.  IMPRESSION:  1.  Prominence of the soft tissues inferior to the patella and anterior to the, which could reflect soft tissue infection given the clinical  history. Suspect small suprapatellar joint effusion.  2.  14 mm bony fragment in the soft tissues of the anterior leg at the level the distal femoral shaft and a small bony fragment projecting over the distal femur in the lateral projection.  The donor sites are likely the anterior cortex of the distal femur just proximal to the hardware of the arthroplasty.  Original Report Authenticated By: Britta Mccreedy, M.D.    Review of Systems  Constitutional: Positive for fever, chills and malaise/fatigue. Negative for weight loss.  HENT: Negative.   Eyes: Negative.   Respiratory: Negative.   Cardiovascular: Negative.   Gastrointestinal: Negative.   Genitourinary: Negative.   Musculoskeletal:       See hpi  Skin: Negative for itching and rash.  Neurological: Negative.   Endo/Heme/Allergies: Negative.   Psychiatric/Behavioral: Negative.     Blood pressure 127/67, pulse 92, temperature 100.6 F (38.1 C), temperature source Oral, resp. rate 20, height 5\' 9"  (1.753 m), weight 112.401 kg (247 lb 12.8 oz), SpO2 93.00%. Physical Exam  Constitutional: She is oriented to person, place, and time. She appears well-developed and well-nourished.       obesity  HENT:  Head: Normocephalic and atraumatic.  Eyes: Pupils are equal, round, and reactive to light.  Neck: Normal range of motion. Neck supple.  Cardiovascular: Normal rate.   Respiratory: Effort normal.  GI: Soft.  Musculoskeletal:  Right shoulder: Normal.       Left shoulder: Normal.       Right hip: Normal.       Left hip: Normal.       Right knee: She exhibits decreased range of motion, swelling and erythema. She exhibits no ecchymosis, no deformity, no laceration and normal alignment. tenderness found.       Left knee: Normal.       Cervical back: Normal.       Legs:      There is surrounding erythema around the incision. There is an area at the midpole of the wound which was open and packed with saline dressing which has now closed.    Neurological: She is alert and oriented to person, place, and time. She has normal reflexes.  Skin: No rash noted. There is erythema. No pallor.  Psychiatric: She has a normal mood and affect. Her behavior is normal. Judgment and thought content normal.    Imaging x-ray of the knee 4 views show soft tissue swelling. There is a bone fragment in the superior aspect of the bone which has been present since surgery.  Assessment/Plan Cellulitis wound dehiscence right knee status post total knee  IV antibiotics with vancomycin continue. Reassess wound in the morning for possible incision and drainage. Patient advised of seriousness of situation possible need for prosthetic removal in the future  [facility] 05/30/2011, 9:05 AM

## 2011-05-31 DIAGNOSIS — T8140XA Infection following a procedure, unspecified, initial encounter: Secondary | ICD-10-CM

## 2011-05-31 LAB — WOUND CULTURE: Special Requests: NORMAL

## 2011-05-31 LAB — BASIC METABOLIC PANEL
BUN: 15 mg/dL (ref 6–23)
Chloride: 105 mEq/L (ref 96–112)
GFR calc non Af Amer: >60 mL/min (ref >60)
Glucose, Bld: 92 mg/dL (ref 70–99)
Potassium: 3.7 mEq/L (ref 3.5–5.1)

## 2011-05-31 LAB — CBC
HCT: 30.6 % — ABNORMAL LOW (ref 36.0–46.0)
Hemoglobin: 9.8 g/dL — ABNORMAL LOW (ref 12.0–15.0)
MCHC: 32 g/dL (ref 30.0–36.0)

## 2011-05-31 NOTE — Patient Instructions (Signed)
Pt to take breaks when she needs to so she does not run out of energy to make it to her destination.

## 2011-05-31 NOTE — Progress Notes (Signed)
Subjective:     Patient reports pain as mild.    Objective: Vital signs in last 24 hours: Temp:  [97 F (36.1 C)-99.3 F (37.4 C)] 97 F (36.1 C) (09/03 0548) Pulse Rate:  [72-81] 72  (09/03 0548) Resp:  [18-20] 18  (09/03 0548) BP: (106-132)/(62-68) 132/67 mmHg (09/03 0548) SpO2:  [90 %-94 %] 90 % (09/03 0548)  Intake/Output from previous day: 09/02 0701 - 09/03 0700 In: 4168.3 [P.O.:450; I.V.:3218.3; IV Piggyback:500] Out: 1300 [Urine:1300] Intake/Output this shift:     Basename 05/31/11 0546 05/30/11 0547 05/29/11 1250  HGB 9.8* 10.3* 12.1    Basename 05/31/11 0546 05/30/11 0547  WBC 9.3 16.5*  RBC 3.26* 3.51*  HCT 30.6* 32.4*  PLT 221 271    Basename 05/31/11 0546 05/30/11 0547  NA 138 134*  K 3.7 3.2*  CL 105 99  CO2 28 27  BUN 15 17  CREATININE 0.72 0.61  GLUCOSE 92 120*  CALCIUM 7.9* 8.2*   No results found for this basename: LABPT:2,INR:2 in the last 72 hours  Incision: no drainage, cellulitis improved   Assessment/Plan:     Advance diet  PT consult gait train only    Fuller Canada 05/31/2011, 8:34 AM

## 2011-05-31 NOTE — Progress Notes (Signed)
Physical Therapy Evaluation Patient Name: [name] [id number] Date: 05/31/2011 Problem List:  Patient Active Problem List  Diagnoses  . Dehiscence of operative wound   Past Medical History:  Past Medical History  Diagnosis Date  . Thyroid disease   . Depression    Past Surgical History:  Past Surgical History  Procedure Date  . Total hip arthroplasty   . Replacement total knee bilateral     Precautions/Restrictions  Precautions Precautions: Fall Restrictions Weight Bearing Restrictions: Yes RLE Weight Bearing: Weight bearing as tolerated Prior Functioning  Home Living Type of Home: House Lives With: Alone Receives Help From: Friend(s);Family Home Layout: One level Home Access: Ramped entrance Bathroom Shower/Tub: Tub/shower unit Bathroom Accessibility: Yes Home Adaptive Equipment: Raised toilet seat with rails;Walker - rolling;Bedside commode/3-in-1 Prior Function Level of Independence: Independent with gait;Independent with basic ADLs Vocation: Retired Financial risk analyst Arousal/Alertness: Awake/alert Overall Cognitive Status: Appears within functional limits for tasks assessed Sensation/Coordination Sensation Light Touch: Appears Intact Proprioception: Appears Intact Extremity Assessment RUE Assessment RUE Assessment: Within Functional Limits Mobility (including Balance) Bed Mobility Bed Mobility: Yes Rolling Left: 5: Supervision Left Sidelying to Sit: 4: Min assist Left Sidelying to Sit Details (indicate cue type and reason): Pt using hand rails of bed; therapist to guide LE Sitting - Scoot to Edge of Bed: 6: Modified independent (Device/Increase time) Transfers Transfers: Yes Sit to Stand: 3: Mod assist Stand to Sit: 5: Supervision Stand Pivot Transfers: 5: Supervision Ambulation/Gait Ambulation/Gait: Yes Ambulation/Gait Assistance: 5: Supervision Ambulation Distance (Feet): 12 Feet Assistive device: Rolling walker Gait Pattern: Step-to  pattern Gait velocity: slow Stairs: No Wheelchair Mobility Wheelchair Mobility: No  Balance Balance Assessed: No Exercise     End of Session PT - End of Session Equipment Utilized During Treatment: Gait belt;Other (comment) (rolling walker) Patient left: in chair Nurse Communication: Mobility status for transfers General Behavior During Session: [facility] for tasks performed Cognition: [facility] for tasks performed PT Assessment/Plan/Recommendation PT Assessment Clinical Impression Statement: Pt had decreased strength limiting I of mobility will need skilled therapy to improve I and safety PT Recommendation/Assessment: Patient will need skilled PT in the acute care venue PT Problem List: Decreased strength;Decreased mobility Barriers to Discharge Comments: Pt does not want to consider SNF level PT Therapy Diagnosis : Difficulty walking;Generalized weakness PT Plan PT Frequency: 7X/week PT Treatment/Interventions: Gait training;Functional mobility training PT Recommendation Follow Up Recommendations: Home health PT Equipment Recommended: None recommended by PT PT Goals  Acute Rehab PT Goals PT Goal Formulation: With patient Time For Goal Achievement: 3 days Pt will Roll Supine to Left Side: with modified independence Pt will go Supine/Side to Sit: with modified independence Pt will Sit at [facility] of Bed: with modified independence Pt will Transfer Sit to Stand/Stand to Sit: with modified independence Pt will Ambulate: 16 - 50 feet Darryl Blumenstein,CINDY 05/31/2011, 12:39 PM

## 2011-06-01 LAB — BASIC METABOLIC PANEL
BUN: 12 mg/dL (ref 6–23)
CO2: 29 mEq/L (ref 19–32)
Chloride: 103 mEq/L (ref 96–112)
Creatinine, Ser: 0.72 mg/dL (ref 0.50–1.10)

## 2011-06-01 LAB — VANCOMYCIN, RANDOM: Vancomycin Rm: 8.4 ug/mL

## 2011-06-01 MED ORDER — BISACODYL 10 MG RE SUPP: 10.0000 mg | Freq: Every day | RECTAL | Status: DC | PRN

## 2011-06-01 NOTE — Progress Notes (Signed)
Subjective:     Patient reports pain as mild.    Objective: Vital signs in last 24 hours: Temp:  [98.2 F (36.8 C)-98.6 F (37 C)] 98.6 F (37 C) (09/04 0530) Pulse Rate:  [68-85] 81  (09/04 0530) Resp:  [16-18] 18  (09/04 0530) BP: (106-157)/(57-71) 130/66 mmHg (09/04 0530) SpO2:  [91 %-96 %] 93 % (09/04 0530)  Intake/Output from previous day: 09/03 0701 - 09/04 0700 In: 1040 [P.O.:1040] Out: 2050 [Urine:2050] Intake/Output this shift:     Basename 05/31/11 0546 05/30/11 0547 05/29/11 1250  HGB 9.8* 10.3* 12.1    Basename 05/31/11 0546 05/30/11 0547  WBC 9.3 16.5*  RBC 3.26* 3.51*  HCT 30.6* 32.4*  PLT 221 271    Basename 06/01/11 0546 05/31/11 0546  NA 140 138  K 3.8 3.7  CL 103 105  CO2 29 28  BUN 12 15  CREATININE 0.72 0.72  GLUCOSE 95 92  CALCIUM 8.4 7.9*   No results found for this basename: LABPT:2,INR:2 in the last 72 hours  Right lower extremity incision right lower chest remedy incision kidneys to drain serosanguineous fluid. Most likely subcutaneous hematoma. The patient will have a PICC line placed today. We will continue evaluation of the wound and consider open drainage if necessary. White blood cell count has significantly decreased patient is afebrile and not having any knee pain.  Assessment/Plan:     Up with therapy  Fuller Canada 06/01/2011, 7:19 AM

## 2011-06-01 NOTE — Progress Notes (Signed)
Physical Therapy Treatment Patient Name: [name] WUJWJ'X Date: 06/01/2011 TIME: 9147-8295 CHARGES:1 GT 1 TE Problem List:  Patient Active Problem List  Diagnoses  . Dehiscence of operative wound   Past Medical History:  Past Medical History  Diagnosis Date  . Thyroid disease   . Depression    Past Surgical History:  Past Surgical History  Procedure Date  . Total hip arthroplasty   . Replacement total knee bilateral    Precautions/Restrictions  Precautions Precautions: Fall Restrictions Weight Bearing Restrictions: Yes RLE Weight Bearing: Weight bearing as tolerated Mobility (including Balance) Bed Mobility Bed Mobility: No Transfers Transfers: Yes Sit to Stand: 3: Mod assist Sit to Stand Details (indicate cue type and reason): vc's to bring feet behind knees and to stand erect (which patient is unable to do at this time) Stand to Sit: 3: Mod assist Stand to Sit Details: vc's needed for pt to wait until surface is at back of legs;pt starts to panic and wants to sit before it is safe Ambulation/Gait Ambulation/Gait: Yes Ambulation/Gait Assistance: 4: Min assist;3: Mod assist Ambulation/Gait Assistance Details (indicate cue type and reason): RW;vc's for standing erect and posterior weight shift Ambulation Distance (Feet): 12 Feet (Chair<>end of bed<>chair) Assistive device: Rolling walker Gait Pattern: Trunk flexed;Step-to pattern;Decreased stance time - right Gait velocity: slow Stairs: No Wheelchair Mobility Wheelchair Mobility: No    Exercise  Total Joint Exercises Hip ABduction/ADduction: Seated;Both;10 reps Long Arc Quad: Seated;10 reps;Right General Exercises - Lower Extremity Long Arc Quad: Seated;10 reps;Right Hip ABduction/ADduction: Seated;Both;10 reps Toe Raises: Seated;Both;20 reps Heel Raises: Seated;Both (20 reps) Low Level/ICU Exercises Hip ABduction/ADduction: Seated;Both;10 reps  End of Session PT - End of Session Equipment Utilized  During Treatment: Gait belt (RW) Activity Tolerance: Patient limited by fatigue (and weakness) Patient left: in chair;with call bell in reach Nurse Communication: Mobility status for transfers General Behavior During Session: [name] for tasks performed Cognition: [facility] for tasks performed PT Assessment/Plan  PT - Assessment/Plan Comments on Treatment Session: Upon standing pt can not stand erect and has severe flexed trunk which make it difficlut to grab RW and keep her balance-Mod A needed;pt ambulates flexed and is anxious to sit again;which causes her to try and sit before it is safe. PT Goals  Acute Rehab PT Goals PT Goal: Sit at Edge Of Bed - Progress: Progressing toward goal PT Transfer Goal: Sit to Stand/Stand to Sit - Progress: Progressing toward goal PT Goal: Ambulate - Progress: Progressing toward goal  [name] 06/01/2011, 1:18 PM

## 2011-06-01 NOTE — Progress Notes (Signed)
ANTIBIOTIC CONSULT NOTE - INITIAL  Pharmacy Consult for Vancomycin Indication: Recent Knee Surgery - Drainage / Dehisence  Patient Measurements: Height: 5\' 9"  (175.3 cm) Weight: 247 lb 12.8 oz (112.401 kg) IBW/kg (Calculated) : 66.2   Vital Signs: Temp: 98.6 F (37 C) (09/04 0530) Temp src: Oral (09/04 0530) BP: 130/66 mmHg (09/04 0530) Pulse Rate: 81  (09/04 0530) Intake/Output from previous day: 09/03 0701 - 09/04 0700 In: 1040 [P.O.:1040] Out: 2050 [Urine:2050] Intake/Output from this shift:    Labs:  Basename 06/01/11 0546 05/31/11 0546 05/30/11 0547 05/29/11 1250  WBC -- 9.3 16.5* 20.6*  HGB -- 9.8* 10.3* 12.1  PLT -- 221 271 320  LABCREA -- -- -- --  CREATININE 0.72 0.72 0.61 --  CRCLEARANCE -- -- -- --      Microbiology: Recent Results (from the past 720 hour(s))  WOUND CULTURE     Status: Normal   Collection Time   05/29/11  2:20 PM      Component Value Range Status Comment   Specimen Description LEG   Final    Special Requests Normal   Final    Gram Stain     Final    Value: NO WBC SEEN     NO SQUAMOUS EPITHELIAL CELLS SEEN     FEW GRAM POSITIVE RODS   Culture     Final    Value: MODERATE DIPHTHEROIDS(CORYNEBACTERIUM SPECIES)     Note: Standardized susceptibility testing for this organism is not available.   Report Status 05/31/2011 FINAL   Final     Medical History: Past Medical History  Diagnosis Date  . Thyroid disease   . Depression     Medications:  Prescriptions prior to admission  Medication Sig Dispense Refill  . ALPRAZolam (XANAX) 0.25 MG tablet Take 0.25 mg by mouth 3 (three) times daily. For anxiety      . furosemide (LASIX) 40 MG tablet Take 40 mg by mouth daily.        Marland Kitchen HYDROcodone-acetaminophen (NORCO) 10-325 MG per tablet Take 1-2 tablets by mouth every 6 (six) hours as needed. For pain      . levothyroxine (SYNTHROID, LEVOTHROID) 150 MCG tablet Take 150 mcg by mouth every morning.        . methocarbamol (ROBAXIN) 500 MG tablet  Take 500 mg by mouth every 6 (six) hours. For muscle spasms      . PARoxetine (PAXIL) 20 MG tablet Take 20 mg by mouth every evening.         Assessment: Vancomycin therapy on going. Serum creatinine stable. Awaiting final C and S.    Goal of Therapy:  Vancomycin trough level 15-20 mcg/ml  Plan:  Measure antibiotic drug levels at steady state Vancomycin trough tonight.   Gilman Buttner, [place] J 06/01/2011,7:39 AM

## 2011-06-02 ENCOUNTER — Inpatient Hospital Stay (HOSPITAL_COMMUNITY): Payer: Medicare Other

## 2011-06-02 ENCOUNTER — Encounter (HOSPITAL_COMMUNITY): Payer: Medicare Other

## 2011-06-02 LAB — BASIC METABOLIC PANEL
BUN: 9 mg/dL (ref 6–23)
CO2: 30 mEq/L (ref 19–32)
Chloride: 102 mEq/L (ref 96–112)
Creatinine, Ser: 0.66 mg/dL (ref 0.50–1.10)
Glucose, Bld: 82 mg/dL (ref 70–99)

## 2011-06-02 MED ORDER — SODIUM CHLORIDE 0.9 % IJ SOLN: 10.0000 mL | Freq: Two times a day (BID) | INTRAMUSCULAR | Status: DC

## 2011-06-02 MED ORDER — SODIUM CHLORIDE 0.9 % IJ SOLN
10.0000 mL | Freq: Two times a day (BID) | INTRAMUSCULAR | Status: DC
  Administered 2011-06-03 – 2011-06-04 (×2): 10 mL
  Filled 2011-06-02 (×2): qty 10

## 2011-06-02 MED ORDER — POTASSIUM CHLORIDE CRYS ER 20 MEQ PO TBCR
40.0000 meq | EXTENDED_RELEASE_TABLET | Freq: Once | ORAL | Status: AC
  Administered 2011-06-02: 40 meq via ORAL
  Filled 2011-06-02: qty 2

## 2011-06-02 MED ORDER — SODIUM CHLORIDE 0.9 % IJ SOLN: 10.0000 mL | INTRAMUSCULAR | Status: DC | PRN

## 2011-06-02 MED ORDER — VANCOMYCIN HCL 1000 MG IV SOLR
1750.0000 mg | INTRAVENOUS | Status: DC
  Administered 2011-06-02 – 2011-06-04 (×3): 1750 mg via INTRAVENOUS
  Filled 2011-06-02 (×6): qty 1750

## 2011-06-02 NOTE — Progress Notes (Signed)
ANTIBIOTIC CONSULT NOTE - INITIAL  Pharmacy Consult for Vancomycin Indication: Recent Knee Surgery - Drainage / Dehisence  Patient Measurements: Height: 5\' 9"  (175.3 cm) Weight: 247 lb 12.8 oz (112.401 kg) IBW/kg (Calculated) : 66.2   Vital Signs: Temp: 98.5 F (36.9 C) (09/05 0547) Temp src: Oral (09/05 0547) BP: 176/74 mmHg (09/05 0547) Pulse Rate: 83  (09/05 0547) Intake/Output from previous day: 09/04 0701 - 09/05 0700 In: 2340 [P.O.:840; I.V.:1000; IV Piggyback:500] Out: 2150 [Urine:2150]  Labs: vanco level = 8.4  Basename 06/02/11 0514 06/01/11 0546 05/31/11 0546  WBC -- -- 9.3  HGB -- -- 9.8*  PLT -- -- 221  LABCREA -- -- --  CREATININE 0.66 0.72 0.72  CRCLEARANCE -- -- --   Microbiology: Recent Results (from the past 720 hour(s))  WOUND CULTURE     Status: Normal   Collection Time   05/29/11  2:20 PM      Component Value Range Status Comment   Specimen Description LEG   Final    Special Requests Normal   Final    Gram Stain     Final    Value: NO WBC SEEN     NO SQUAMOUS EPITHELIAL CELLS SEEN     FEW GRAM POSITIVE RODS   Culture     Final    Value: MODERATE DIPHTHEROIDS(CORYNEBACTERIUM SPECIES)     Note: Standardized susceptibility testing for this organism is not available.   Report Status 05/31/2011 FINAL   Final    Medical History: Past Medical History  Diagnosis Date  . Thyroid disease   . Depression    Medications:  Prescriptions prior to admission  Medication Sig Dispense Refill  . ALPRAZolam (XANAX) 0.25 MG tablet Take 0.25 mg by mouth 3 (three) times daily. For anxiety      . furosemide (LASIX) 40 MG tablet Take 40 mg by mouth daily.        Marland Kitchen HYDROcodone-acetaminophen (NORCO) 10-325 MG per tablet Take 1-2 tablets by mouth every 6 (six) hours as needed. For pain      . levothyroxine (SYNTHROID, LEVOTHROID) 150 MCG tablet Take 150 mcg by mouth every morning.        . methocarbamol (ROBAXIN) 500 MG tablet Take 500 mg by mouth every 6 (six)  hours. For muscle spasms      . PARoxetine (PAXIL) 20 MG tablet Take 20 mg by mouth every evening.         Assessment: Vancomycin therapy on going. Serum creatinine stable. Vancomycin trough level below goal  Goal of Therapy:  vanco trough level 10-15  Plan:  Increase vancomycin to 1750mg  iv q24hrs F/u trough level at steady state Monitor SCr/renal fxn  Valrie Hart A 06/02/2011,10:00 AM

## 2011-06-02 NOTE — Progress Notes (Signed)
UR Chart Review Completed  

## 2011-06-02 NOTE — Progress Notes (Signed)
Physical Therapy Treatment Patient Name: [name] EAVWU'J Date: 06/02/2011 TIME: 8119-1478 CHARGES: 1 GT 1 TE Problem List:  Patient Active Problem List  Diagnoses  . Dehiscence of operative wound   Past Medical History:  Past Medical History  Diagnosis Date  . Thyroid disease   . Depression    Past Surgical History:  Past Surgical History  Procedure Date  . Total hip arthroplasty   . Replacement total knee bilateral    Precautions/Restrictions  Precautions Precautions: Fall Restrictions Weight Bearing Restrictions: Yes RLE Weight Bearing: Weight bearing as tolerated Mobility (including Balance) Transfers Sit to Stand: 3: Mod assist Stand to Sit: 3: Mod assist Stand to Sit Details: Pt still wanting to descend before at safely at surface Ambulation/Gait Ambulation/Gait: Yes Ambulation/Gait Assistance: 4: Min assist;3: Mod assist Ambulation Distance (Feet): 12 Feet Assistive device: Rolling walker Gait velocity: slow Stairs: No Wheelchair Mobility Wheelchair Mobility: No    Exercise  Total Joint Exercises Long Arc Quad: Right;10 reps General Exercises - Lower Extremity Long Arc Quad: Right;10 reps Hip Flexion/Marching: Right;10 reps Toe Raises: Both;10 reps Heel Raises: 10 reps;Both Other Exercises Other Exercises: sit <>stand x2 for strengthening/endurance during adls of BSC use  End of Session PT - End of Session Equipment Utilized During Treatment: Gait belt (RW) Patient left: in chair;with call bell in reach General Behavior During Session: [facility] for tasks performed Cognition: [facility] for tasks performed PT Assessment/Plan  PT - Assessment/Plan Comments on Treatment Session: Pt still having difficulty grabbing walker upon standing due to increased trunk flexion;pt continues to ambulate with flexed trunk;however today was able to increase step lengths Equipment Recommended: Defer to next venue PT Goals  Acute Rehab PT Goals PT Transfer Goal: Sit to  Stand/Stand to Sit - Progress: Progressing toward goal PT Goal: Ambulate - Progress: Progressing toward goal  [name] 06/02/2011, 1:01 PM

## 2011-06-02 NOTE — Progress Notes (Signed)
Subjective: none   Objective: Vital signs in last 24 hours: Temp:  [98.5 F (36.9 C)-98.9 F (37.2 C)] 98.5 F (36.9 C) (09/05 1400) Pulse Rate:  [69-83] 69  (09/05 1400) Resp:  [18-20] 20  (09/05 1400) BP: (115-176)/(67-74) 115/67 mmHg (09/05 1400) SpO2:  [93 %-95 %] 95 % (09/05 1400)  Intake/Output from previous day: 09/04 0701 - 09/05 0700 In: 2340 [P.O.:840; I.V.:1000; IV Piggyback:500] Out: 2150 [Urine:2150] Intake/Output this shift: I/O this shift: In: 0  Out: 1050 [Urine:1050]   Basename 05/31/11 0546  HGB 9.8*    Basename 05/31/11 0546  WBC 9.3  RBC 3.26*  HCT 30.6*  PLT 221    Basename 06/02/11 0514 06/01/11 0546  NA 138 140  K 3.3* 3.8  CL 102 103  CO2 30 29  BUN 9 12  CREATININE 0.66 0.72  GLUCOSE 82 95  CALCIUM 8.3* 8.4   No results found for this basename: LABPT:2,INR:2 in the last 72 hours  Incision: moderate drainage  Assessment/Plan: Plan for surgery Friday    Fuller Canada 06/02/2011, 5:21 PM

## 2011-06-02 NOTE — Progress Notes (Signed)
Occupational Therapy Evaluation Patient Name: [name] UJWJX'B Date: 06/02/2011 Problem List:  Patient Active Problem List  Diagnoses  . Dehiscence of operative wound   Past Medical History:  Past Medical History  Diagnosis Date  . Thyroid disease   . Depression    Past Surgical History:  Past Surgical History  Procedure Date  . Total hip arthroplasty   . Replacement total knee bilateral     Precautions/Restrictions  Precautions Precautions: Fall Restrictions Weight Bearing Restrictions: Yes RLE Weight Bearing: Weight bearing as tolerated Prior Functioning  Home Living Type of Home: House Lives With: Alone Receives Help From: Friend(s);Family Home Layout: One level Home Access: Ramped entrance Bathroom Shower/Tub: Tub/shower unit Bathroom Accessibility: Yes Prior Function Level of Independence: Independent with transfers;Independent with homemaking with wheelchair;Independent with basic ADLs Vocation: Retired ADL ADL Eating/Feeding: Set up Grooming: Brushing hair;Set up Lower Body Dressing: Set up Lower Body Dressing Details (indicate cue type and reason): donned and doffed L slipper sock after setup.  Refused to do R due to swelling in RLE.  Would require at least min pa with RLE dressing. Vision/Perception  Vision - History Baseline Vision: Wears glasses all the time Cognition Cognition Arousal/Alertness: Awake/alert Overall Cognitive Status: Appears within functional limits for tasks assessed Sensation/Coordination Sensation Light Touch: Appears Intact Coordination Gross Motor Movements are Fluid and Coordinated: Yes Fine Motor Movements are Fluid and Coordinated: Yes Extremity Assessment RUE Assessment RUE Assessment: Within Functional Limits LUE Assessment LUE Assessment: Within Functional Limits Mobility    Exercises    End of Session OT - End of Session Activity Tolerance: Patient tolerated treatment well Patient left: in  bed General Behavior During Session: [facility] for tasks performed Cognition: [facility] for tasks performed OT Assessment/Plan/Recommendation OT Assessment Clinical Impression Statement: A:  Patient presents with opening of R TKA surgical incision.  She has decreased functional mobility and I with BADLs. OT Recommendation/Assessment: Patient will need skilled OT in the acute care venue OT Problem List: Decreased activity tolerance;Pain OT Therapy Diagnosis : Generalized weakness;Acute pain OT Plan OT Frequency: Min 2X/week OT Treatment/Interventions: Self-care/ADL training;Therapeutic exercise;Therapeutic activities OT Recommendation Follow Up Recommendations: Home health OT Equipment Recommended: Defer to next venue Individuals Consulted Consulted and Agree with Results and Recommendations: Patient OT Goals Acute Rehab OT Goals OT Goal Formulation: With patient Miscellaneous OT Goals Miscellaneous OT Goal #1: Patient will complete BADLs at prior level of I. Miscellaneous OT Goal #2: Patient will tolerate standing at sink for grooming with min pa x 3 min.  Shirlean Mylar, OTR/L  06/02/2011, 9:20 AM

## 2011-06-03 LAB — BASIC METABOLIC PANEL
Chloride: 102 mEq/L (ref 96–112)
GFR calc non Af Amer: >60 mL/min (ref >60)
Glucose, Bld: 102 mg/dL — ABNORMAL HIGH (ref 70–99)
Potassium: 4.2 mEq/L (ref 3.5–5.1)
Sodium: 140 mEq/L (ref 135–145)

## 2011-06-03 NOTE — Progress Notes (Signed)
Subjective:   Procedure(s) (LRB): IRRIGATION AND DEBRIDEMENT WOUND (Right) Patient reports pain as mild.    Objective: Vital signs in last 24 hours: Temp:  [98.1 F (36.7 C)-98.5 F (36.9 C)] 98.1 F (36.7 C) (09/06 0522) Pulse Rate:  [69-84] 84  (09/06 0522) Resp:  [18-20] 18  (09/06 0522) BP: (115-174)/(67-80) 174/80 mmHg (09/06 0522) SpO2:  [95 %-97 %] 97 % (09/06 0522)  Intake/Output from previous day: 09/05 0701 - 09/06 0700 In: 1329.5 [P.O.:120; I.V.:1209.5] Out: 1150 [Urine:1150] Intake/Output this shift:    No results found for this basename: HGB:5 in the last 72 hours No results found for this basename: WBC:2,RBC:2,HCT:2,PLT:2 in the last 72 hours  Basename 06/03/11 0533 06/02/11 0514  NA 140 138  K 4.2 3.3*  CL 102 102  CO2 31 30  BUN 9 9  CREATININE 0.69 0.66  GLUCOSE 102* 82  CALCIUM 8.8 8.3*   No results found for this basename: LABPT:2,INR:2 in the last 72 hours  Neurologically intact Neurovascular intact Sensation intact distally Intact pulses distally  Assessment/Plan:   Procedure(s) (LRB): IRRIGATION AND DEBRIDEMENT WOUND (Right) PLAN: SURGERY IN AM   [name] 06/03/2011, 7:46 AM

## 2011-06-03 NOTE — Progress Notes (Signed)
ANTIBIOTIC CONSULT NOTE   Pharmacy Consult for Vancomycin Indication: Recent Knee Surgery - Drainage / Dehisence  Patient Measurements: Height: 5\' 9"  (175.3 cm) Weight: 247 lb 12.8 oz (112.401 kg) IBW/kg (Calculated) : 66.2   Vital Signs: Temp: 98.1 F (36.7 C) (09/06 0522) Temp src: Oral (09/06 0522) BP: 174/80 mmHg (09/06 0522) Pulse Rate: 84  (09/06 0522) Intake/Output from previous day: 09/05 0701 - 09/06 0700 In: 1329.5 [P.O.:120; I.V.:1209.5] Out: 1150 [Urine:1150]  Labs: vanco level = 8.4  Basename 06/03/11 0533 06/02/11 0514 06/01/11 0546  WBC -- -- --  HGB -- -- --  PLT -- -- --  LABCREA -- -- --  CREATININE 0.69 0.66 0.72  CRCLEARANCE -- -- --   Microbiology: Recent Results (from the past 720 hour(s))  WOUND CULTURE     Status: Normal   Collection Time   05/29/11  2:20 PM      Component Value Range Status Comment   Specimen Description LEG   Final    Special Requests Normal   Final    Gram Stain     Final    Value: NO WBC SEEN     NO SQUAMOUS EPITHELIAL CELLS SEEN     FEW GRAM POSITIVE RODS   Culture     Final    Value: MODERATE DIPHTHEROIDS(CORYNEBACTERIUM SPECIES)     Note: Standardized susceptibility testing for this organism is not available.   Report Status 05/31/2011 FINAL   Final    Medical History: Past Medical History  Diagnosis Date  . Thyroid disease   . Depression    Medications:  Prescriptions prior to admission  Medication Sig Dispense Refill  . ALPRAZolam (XANAX) 0.25 MG tablet Take 0.25 mg by mouth 3 (three) times daily. For anxiety      . furosemide (LASIX) 40 MG tablet Take 40 mg by mouth daily.        Marland Kitchen HYDROcodone-acetaminophen (NORCO) 10-325 MG per tablet Take 1-2 tablets by mouth every 6 (six) hours as needed. For pain      . levothyroxine (SYNTHROID, LEVOTHROID) 150 MCG tablet Take 150 mcg by mouth every morning.        . methocarbamol (ROBAXIN) 500 MG tablet Take 500 mg by mouth every 6 (six) hours. For muscle spasms        . PARoxetine (PAXIL) 20 MG tablet Take 20 mg by mouth every evening.         Assessment: Vancomycin therapy on going. Serum creatinine stable. Vancomycin trough level below goal  Goal of Therapy:  vanco trough level 10-15  Plan:  vancomycin to 1750mg  iv q24hrs yesterday repeat trough level Friday Monitor SCr/renal fxn  Margo Aye, Azura Tufaro A 06/03/2011,10:20 AM

## 2011-06-03 NOTE — Progress Notes (Signed)
Physical Therapy Treatment Patient Name: Enrika Aguado WUJWJ'X Date: 06/03/2011 TIME: 9147-8295 CHRGE: 1 TE Problem List:  Patient Active Problem List  Diagnoses  . Dehiscence of operative wound   Past Medical History:  Past Medical History  Diagnosis Date  . Thyroid disease   . Depression    Past Surgical History:  Past Surgical History  Procedure Date  . Total hip arthroplasty   . Replacement total knee bilateral    Precautions/Restrictions  Precautions Precautions: Fall Restrictions Weight Bearing Restrictions: Yes RLE Weight Bearing: Weight bearing as tolerated Mobility (including Balance)      Exercise  Total Joint Exercises Ankle Circles/Pumps: Both;20 reps Hip ABduction/ADduction: Both;10 reps General Exercises - Upper Extremity Shoulder Flexion: Both;20 reps Shoulder ABduction: Both;20 reps Elbow Flexion: Both;10 reps General Exercises - Lower Extremity Ankle Circles/Pumps: Both;20 reps Hip ABduction/ADduction: Both;10 reps Low Level/ICU Exercises Ankle Circles/Pumps: Both;20 reps Hip ABduction/ADduction: Both;10 reps Shoulder Flexion: Both;20 reps Elbow Flexion: Both;10 reps  End of Session PT - End of Session Activity Tolerance: Patient limited by pain Patient left: in chair;with call bell in reach General Behavior During Session: [facility] for tasks performed Cognition: [facility] for tasks performed PT Assessment/Plan  PT - Assessment/Plan Comments on Treatment Session: Pt needing surgery in am to drain and debride R knee incision;RLE extrememly swollen and painful to bend;seated exercises only done at this time;tolerated well PT Goals     Deontrae Drinkard ATKINSO 06/03/2011, 12:57 PM

## 2011-06-03 NOTE — Progress Notes (Signed)
Occupational Therapy Treatment Patient Name: Shaya Reddick FAOZH'Y Date: 06/03/2011 Problem List:  Patient Active Problem List  Diagnoses  . Dehiscence of operative wound   Past Medical History:  Past Medical History  Diagnosis Date  . Thyroid disease   . Depression    Past Surgical History:  Past Surgical History  Procedure Date  . Total hip arthroplasty   . Replacement total knee bilateral     Precautions/Restrictions  Precautions Precautions: Fall Restrictions Weight Bearing Restrictions: Yes RLE Weight Bearing: Weight bearing as tolerated   ADL   Mobility    Exercises General Exercises - Upper Extremity Shoulder Flexion: Theraband;15 reps Theraband Level (Shoulder Flexion): Level 3 (Green) Shoulder Extension: Theraband;15 reps Theraband Level (Shoulder Extension): Level 3 (Green) Shoulder ABduction: Both;20 reps Shoulder Horizontal ABduction: Theraband;15 reps Theraband Level (Shoulder Horizontal Abduction): Level 3 (Green) Shoulder Horizontal ADduction: Theraband;15 reps Theraband Level (Shoulder Horizontal Adduction): Level 3 (Green) Elbow Flexion: Both;10 reps Elbow Extension: Theraband;15 reps Theraband Level (Elbow Extension): Level 3 (Green)  End of Session General Behavior During Session: [facility] for tasks performed Cognition: [facility] for tasks performed OT Assessment/Plan OT Assessment/Plan Comments on Treatment Session: patient scheduled for surgery in the am.  Pts leg very swollen and patient prefers to not get up secondary to the amount of edema.  Gave patient 3 exercises to complete with theraband while in chair to prevent decrease in strength. OT Plan: Discharge plan remains appropriate OT Goals    Kamrie Fanton L. Jaesean Litzau, COTA/L   06/03/2011, 4:02 PM

## 2011-06-04 ENCOUNTER — Encounter (HOSPITAL_COMMUNITY): Payer: Self-pay | Admitting: Anesthesiology

## 2011-06-04 ENCOUNTER — Inpatient Hospital Stay (HOSPITAL_COMMUNITY): Payer: Medicare Other | Admitting: Anesthesiology

## 2011-06-04 ENCOUNTER — Encounter (HOSPITAL_COMMUNITY)
Admission: EM | Disposition: A | Discharge status: Home Health | Payer: Self-pay | Source: Home / Self Care | Attending: Orthopedic Surgery

## 2011-06-04 DIAGNOSIS — S838X9A Sprain of other specified parts of unspecified knee, initial encounter: Secondary | ICD-10-CM

## 2011-06-04 DIAGNOSIS — S86819A Strain of other muscle(s) and tendon(s) at lower leg level, unspecified leg, initial encounter: Secondary | ICD-10-CM | POA: Diagnosis present

## 2011-06-04 HISTORY — PX: PATELLAR TENDON REPAIR: SHX737

## 2011-06-04 HISTORY — PX: HEMATOMA EVACUATION: SHX5118

## 2011-06-04 LAB — BASIC METABOLIC PANEL
BUN: 8 mg/dL (ref 6–23)
CO2: 30 mEq/L (ref 19–32)
Chloride: 98 mEq/L (ref 96–112)
GFR calc Af Amer: >60 mL/min (ref >60)
Potassium: 4 mEq/L (ref 3.5–5.1)

## 2011-06-04 LAB — VANCOMYCIN, TROUGH: Vancomycin Tr: 14 ug/mL (ref 10.0–20.0)

## 2011-06-04 SURGERY — REPAIR, TENDON, PATELLAR
Anesthesia: Spinal | Site: Knee | Laterality: Right | Wound class: Clean

## 2011-06-04 MED ORDER — TEMAZEPAM 15 MG PO CAPS: 15.0000 mg | ORAL_CAPSULE | Freq: Every evening | ORAL | Status: DC | PRN

## 2011-06-04 MED ORDER — DOCUSATE SODIUM 100 MG PO CAPS
100.0000 mg | ORAL_CAPSULE | Freq: Two times a day (BID) | ORAL | Status: DC
  Administered 2011-06-04 – 2011-06-09 (×11): 100 mg via ORAL
  Filled 2011-06-04 (×9): qty 1

## 2011-06-04 MED ORDER — LACTATED RINGERS IV SOLN
INTRAVENOUS | Status: DC
  Administered 2011-06-04 (×2): via INTRAVENOUS

## 2011-06-04 MED ORDER — FENTANYL CITRATE 0.05 MG/ML IJ SOLN: 25.0000 ug | INTRAMUSCULAR | Status: DC | PRN

## 2011-06-04 MED ORDER — GLYCOPYRROLATE 0.2 MG/ML IJ SOLN
0.2000 mg | Freq: Once | INTRAMUSCULAR | Status: AC | PRN
  Administered 2011-06-04: 0.2 mg via INTRAVENOUS

## 2011-06-04 MED ORDER — FLEET ENEMA 7-19 GM/118ML RE ENEM: 1.0000 | ENEMA | Freq: Every day | RECTAL | Status: DC | PRN

## 2011-06-04 MED ORDER — METHOCARBAMOL 500 MG PO TABS: 500.0000 mg | ORAL_TABLET | Freq: Four times a day (QID) | ORAL | Status: DC | PRN

## 2011-06-04 MED ORDER — OXYCODONE HCL 5 MG PO TABS: 5.0000 mg | ORAL_TABLET | ORAL | Status: DC | PRN

## 2011-06-04 MED ORDER — ALUMINUM HYDROXIDE GEL 600 MG/5ML PO SUSP
15.0000 mL | ORAL | Status: DC | PRN
  Filled 2011-06-04: qty 30

## 2011-06-04 MED ORDER — BISACODYL 10 MG RE SUPP: 10.0000 mg | Freq: Every day | RECTAL | Status: DC | PRN

## 2011-06-04 MED ORDER — ACETAMINOPHEN 325 MG PO TABS: 650.0000 mg | ORAL_TABLET | Freq: Four times a day (QID) | ORAL | Status: DC | PRN

## 2011-06-04 MED ORDER — GLYCOPYRROLATE 0.2 MG/ML IJ SOLN
INTRAMUSCULAR | Status: AC
  Filled 2011-06-04: qty 1

## 2011-06-04 MED ORDER — POLYETHYLENE GLYCOL 3350 17 G PO PACK: 17.0000 g | PACK | Freq: Every day | ORAL | Status: DC | PRN

## 2011-06-04 MED ORDER — PHENOL 1.4 % MT LIQD: 1.0000 | OROMUCOSAL | Status: DC | PRN

## 2011-06-04 MED ORDER — HYDROMORPHONE HCL 1 MG/ML IJ SOLN
0.5000 mg | INTRAMUSCULAR | Status: DC | PRN
  Administered 2011-06-05 – 2011-06-07 (×6): 1 mg via INTRAVENOUS
  Filled 2011-06-04 (×9): qty 1

## 2011-06-04 MED ORDER — EPHEDRINE SULFATE 50 MG/ML IJ SOLN
INTRAMUSCULAR | Status: DC | PRN
  Administered 2011-06-04: 10 mg via INTRAVENOUS
  Administered 2011-06-04: 5 mg via INTRAVENOUS

## 2011-06-04 MED ORDER — MENTHOL 3 MG MT LOZG: 1.0000 | LOZENGE | OROMUCOSAL | Status: DC | PRN

## 2011-06-04 MED ORDER — ONDANSETRON HCL 4 MG/2ML IJ SOLN
INTRAMUSCULAR | Status: AC
  Administered 2011-06-04: 11:00:00
  Filled 2011-06-04: qty 2

## 2011-06-04 MED ORDER — ACETAMINOPHEN 650 MG RE SUPP: 650.0000 mg | Freq: Four times a day (QID) | RECTAL | Status: DC | PRN

## 2011-06-04 MED ORDER — VANCOMYCIN HCL 1000 MG IV SOLR
1250.0000 mg | Freq: Two times a day (BID) | INTRAVENOUS | Status: DC
  Administered 2011-06-05 – 2011-06-09 (×9): 1250 mg via INTRAVENOUS
  Filled 2011-06-04 (×11): qty 1250

## 2011-06-04 MED ORDER — PROPOFOL 10 MG/ML IV EMUL
INTRAVENOUS | Status: AC
  Filled 2011-06-04: qty 20

## 2011-06-04 MED ORDER — HYDROCODONE-ACETAMINOPHEN 10-325 MG PO TABS
1.0000 | ORAL_TABLET | ORAL | Status: DC | PRN
  Administered 2011-06-04 – 2011-06-08 (×10): 2 via ORAL
  Administered 2011-06-08: 1 via ORAL
  Administered 2011-06-09: 2 via ORAL
  Filled 2011-06-04 (×2): qty 2
  Filled 2011-06-04: qty 1
  Filled 2011-06-04 (×10): qty 2

## 2011-06-04 MED ORDER — MAGNESIUM HYDROXIDE 400 MG/5ML PO SUSP: 30.0000 mL | Freq: Two times a day (BID) | ORAL | Status: DC | PRN

## 2011-06-04 MED ORDER — METOCLOPRAMIDE HCL 5 MG/ML IJ SOLN: 5.0000 mg | Freq: Three times a day (TID) | INTRAMUSCULAR | Status: DC | PRN

## 2011-06-04 MED ORDER — ONDANSETRON HCL 4 MG/2ML IJ SOLN: 4.0000 mg | Freq: Four times a day (QID) | INTRAMUSCULAR | Status: DC | PRN

## 2011-06-04 MED ORDER — METOCLOPRAMIDE HCL 10 MG PO TABS: 5.0000 mg | ORAL_TABLET | Freq: Three times a day (TID) | ORAL | Status: DC | PRN

## 2011-06-04 MED ORDER — SODIUM CHLORIDE 0.9 % IR SOLN
Status: DC | PRN
  Administered 2011-06-04: 3000 mL
  Administered 2011-06-04: 1000 mL

## 2011-06-04 MED ORDER — MIDAZOLAM HCL 2 MG/2ML IJ SOLN
1.0000 mg | INTRAMUSCULAR | Status: DC | PRN
  Administered 2011-06-04: 2 mg via INTRAVENOUS

## 2011-06-04 MED ORDER — ALUM & MAG HYDROXIDE-SIMETH 200-200-20 MG/5ML PO SUSP: 30.0000 mL | ORAL | Status: DC | PRN

## 2011-06-04 MED ORDER — METHOCARBAMOL 100 MG/ML IJ SOLN
500.0000 mg | Freq: Four times a day (QID) | INTRAVENOUS | Status: DC | PRN
  Filled 2011-06-04: qty 5

## 2011-06-04 MED ORDER — BUPIVACAINE IN DEXTROSE 0.75-8.25 % IT SOLN
INTRATHECAL | Status: AC
  Filled 2011-06-04: qty 2

## 2011-06-04 MED ORDER — KCL-LACTATED RINGERS 20 MEQ/L IV SOLN
INTRAVENOUS | Status: DC
  Administered 2011-06-04 – 2011-06-05 (×2): via INTRAVENOUS
  Administered 2011-06-07: 20 mL/h via INTRAVENOUS
  Filled 2011-06-04 (×9): qty 1000

## 2011-06-04 MED ORDER — BUPIVACAINE HCL 0.75 % IJ SOLN
INTRAMUSCULAR | Status: DC | PRN
  Administered 2011-06-04: 12 mg via INTRATHECAL

## 2011-06-04 MED ORDER — MIDAZOLAM HCL 2 MG/2ML IJ SOLN
INTRAMUSCULAR | Status: AC
  Filled 2011-06-04: qty 2

## 2011-06-04 MED ORDER — ONDANSETRON HCL 4 MG/2ML IJ SOLN
4.0000 mg | Freq: Once | INTRAMUSCULAR | Status: AC
  Administered 2011-06-04: 4 mg via INTRAVENOUS

## 2011-06-04 MED ORDER — BISACODYL 5 MG PO TBEC: 10.0000 mg | DELAYED_RELEASE_TABLET | Freq: Every day | ORAL | Status: DC | PRN

## 2011-06-04 MED ORDER — EPHEDRINE SULFATE 50 MG/ML IJ SOLN
INTRAMUSCULAR | Status: AC
  Filled 2011-06-04: qty 1

## 2011-06-04 MED ORDER — FENTANYL CITRATE 0.05 MG/ML IJ SOLN
INTRAMUSCULAR | Status: DC | PRN
  Administered 2011-06-04: 50 ug via INTRAVENOUS
  Administered 2011-06-04: 25 ug via INTRAVENOUS

## 2011-06-04 MED ORDER — ONDANSETRON HCL 4 MG PO TABS: 4.0000 mg | ORAL_TABLET | Freq: Four times a day (QID) | ORAL | Status: DC | PRN

## 2011-06-04 MED ORDER — SUCCINYLCHOLINE CHLORIDE 20 MG/ML IJ SOLN
INTRAMUSCULAR | Status: AC
  Filled 2011-06-04: qty 1

## 2011-06-04 MED ORDER — FENTANYL CITRATE 0.05 MG/ML IJ SOLN
INTRAMUSCULAR | Status: AC
  Filled 2011-06-04: qty 2

## 2011-06-04 MED ORDER — FENTANYL CITRATE 0.05 MG/ML IJ SOLN
INTRAMUSCULAR | Status: DC | PRN
  Administered 2011-06-04: 25 ug via INTRATHECAL

## 2011-06-04 MED ORDER — PROPOFOL 10 MG/ML IV EMUL
INTRAVENOUS | Status: DC | PRN
  Administered 2011-06-04: 25 ug/kg/min via INTRAVENOUS

## 2011-06-04 MED ORDER — LIDOCAINE HCL (PF) 1 % IJ SOLN
INTRAMUSCULAR | Status: AC
  Filled 2011-06-04: qty 5

## 2011-06-04 MED ORDER — ACETAMINOPHEN 325 MG PO TABS: 325.0000 mg | ORAL_TABLET | ORAL | Status: DC | PRN

## 2011-06-04 MED ORDER — ONDANSETRON HCL 4 MG/2ML IJ SOLN: 4.0000 mg | Freq: Once | INTRAMUSCULAR | Status: AC | PRN

## 2011-06-04 SURGICAL SUPPLY — 62 items
BAG HAMPER (MISCELLANEOUS) ×2 IMPLANT
BANDAGE ELASTIC 4 VELCRO NS (GAUZE/BANDAGES/DRESSINGS) ×1 IMPLANT
BANDAGE ELASTIC 6 VELCRO NS (GAUZE/BANDAGES/DRESSINGS) ×2 IMPLANT
BANDAGE ESMARK 4X12 BL STRL LF (DISPOSABLE) ×1 IMPLANT
BIT DRILL 2.0X128 (BIT) ×1 IMPLANT
BNDG CMPR 12X4 ELC STRL LF (DISPOSABLE) ×1
BNDG COHESIVE 4X5 TAN NS LF (GAUZE/BANDAGES/DRESSINGS) IMPLANT
BNDG ESMARK 4X12 BLUE STRL LF (DISPOSABLE) ×2
CHLORAPREP W/TINT 26ML (MISCELLANEOUS) ×2 IMPLANT
CLOTH BEACON ORANGE TIMEOUT ST (SAFETY) ×2 IMPLANT
COVER LIGHT HANDLE STERIS (MISCELLANEOUS) ×4 IMPLANT
COVER MAYO STAND XLG (DRAPE) IMPLANT
DECANTER SPIKE VIAL GLASS SM (MISCELLANEOUS) ×1 IMPLANT
DRAPE INCISE IOBAN 44X35 STRL (DRAPES) IMPLANT
DRESSING GELFORM 100 (GAUZE/BANDAGES/DRESSINGS) IMPLANT
FORMALIN 10 PREFIL 480ML (MISCELLANEOUS) ×1 IMPLANT
GAUZE PACKING IODOFORM 1 (PACKING) IMPLANT
GAUZE XEROFORM 5X9 LF (GAUZE/BANDAGES/DRESSINGS) ×1 IMPLANT
GLOVE INDICATOR 7.5 STRL GRN (GLOVE) ×1 IMPLANT
GLOVE SKINSENSE NS SZ8.0 LF (GLOVE) ×1
GLOVE SKINSENSE STRL SZ8.0 LF (GLOVE) ×1 IMPLANT
GLOVE SS BIOGEL STRL SZ 6.5 (GLOVE) IMPLANT
GLOVE SS N UNI LF 8.5 STRL (GLOVE) ×2 IMPLANT
GLOVE SUPERSENSE BIOGEL SZ 6.5 (GLOVE) ×1
GOWN BRE IMP SLV AUR XL STRL (GOWN DISPOSABLE) ×2 IMPLANT
GOWN STRL REIN XL XLG (GOWN DISPOSABLE) ×2 IMPLANT
HANDPIECE INTERPULSE COAX TIP (DISPOSABLE) ×2
IMMOBILIZER KNEE 19 UNV (ORTHOPEDIC SUPPLIES) ×1 IMPLANT
IV NS IRRIG 3000ML ARTHROMATIC (IV SOLUTION) IMPLANT
KIT ROOM TURNOVER APOR (KITS) ×2 IMPLANT
MANIFOLD NEPTUNE II (INSTRUMENTS) ×2 IMPLANT
MARKER SKIN DUAL TIP RULER LAB (MISCELLANEOUS) ×2 IMPLANT
NDL HYPO 21X1.5 SAFETY (NEEDLE) ×1 IMPLANT
NDL MAYO 6 CRC TAPER PT (NEEDLE) IMPLANT
NEEDLE HYPO 21X1.5 SAFETY (NEEDLE) ×2 IMPLANT
NEEDLE MAYO 6 CRC TAPER PT (NEEDLE) ×2 IMPLANT
NS IRRIG 1000ML POUR BTL (IV SOLUTION) ×2 IMPLANT
PACK BASIC LIMB (CUSTOM PROCEDURE TRAY) ×2 IMPLANT
PAD ABD 5X9 TENDERSORB (GAUZE/BANDAGES/DRESSINGS) ×2 IMPLANT
PAD ARMBOARD 7.5X6 YLW CONV (MISCELLANEOUS) ×2 IMPLANT
PAD CAST 4YDX4 CTTN HI CHSV (CAST SUPPLIES) ×1 IMPLANT
PADDING CAST COTTON 4X4 STRL (CAST SUPPLIES) ×2
SET BASIN LINEN APH (SET/KITS/TRAYS/PACK) ×2 IMPLANT
SET HNDPC FAN SPRY TIP SCT (DISPOSABLE) ×1 IMPLANT
SPONGE GAUZE 4X4 12PLY (GAUZE/BANDAGES/DRESSINGS) ×1 IMPLANT
SPONGE LAP 18X18 X RAY DECT (DISPOSABLE) ×4 IMPLANT
STAPLER VISISTAT 35W (STAPLE) IMPLANT
SUT BRALON NAB BRD #1 30IN (SUTURE) ×3 IMPLANT
SUT ETHIBOND NAB OS 4 #2 30IN (SUTURE) ×3 IMPLANT
SUT ETHILON 3 0 FSL (SUTURE) IMPLANT
SUT ETHILON NAB BLK LR #2 30IN (SUTURE) ×2 IMPLANT
SUT MON AB 0 CT1 (SUTURE) ×2 IMPLANT
SUT MON AB 2-0 SH 27 (SUTURE)
SUT MON AB 2-0 SH27 (SUTURE) IMPLANT
SUT MON AB 3-0 SH 27 (SUTURE) IMPLANT
SUT VIC AB 1 CT1 27 (SUTURE)
SUT VIC AB 1 CT1 27XBRD ANTBC (SUTURE) IMPLANT
SWAB CULTURE LIQ STUART DBL (MISCELLANEOUS) ×2 IMPLANT
SYR BULB IRRIGATION 50ML (SYRINGE) ×4 IMPLANT
SYR CONTROL 10ML LL (SYRINGE) ×2 IMPLANT
TUBE ANAEROBIC PORT A CUL  W/M (MISCELLANEOUS) ×2 IMPLANT
YANKAUER SUCT BULB TIP 10FT TU (MISCELLANEOUS) ×2 IMPLANT

## 2011-06-04 NOTE — Transfer of Care (Signed)
Immediate Anesthesia Transfer of Care Note  Patient: [name]  Procedure(s) Performed:  PATELLA TENDON REPAIR; EVACUATION HEMATOMA - evacuation of seroma  Patient Location: PACU  Anesthesia Type: Spinal  Level of Consciousness: awake and alert   Airway & Oxygen Therapy: Patient Spontanous Breathing and Patient connected to face mask oxygen  Post-op Assessment: Report given to PACU RN  Post vital signs: Reviewed and stable  Complications: No apparent anesthesia complications

## 2011-06-04 NOTE — Progress Notes (Signed)
lr  Attached to  picc line

## 2011-06-04 NOTE — Progress Notes (Signed)
ANTIBIOTIC CONSULT NOTE   Pharmacy Consult for Vancomycin Indication: Recent Knee Surgery - Drainage / Dehisence  Patient Measurements: Height: 5\' 9"  (175.3 cm) Weight: 247 lb 12.8 oz (112.401 kg) IBW/kg (Calculated) : 66.2   Vital Signs: Temp: 98 F (36.7 C) (09/07 1000) Temp src: Oral (09/07 1000) BP: 129/61 mmHg (09/07 1100) Pulse Rate: 73  (09/07 1055) Intake/Output from previous day: 09/06 0701 - 09/07 0700 In: 2395 [P.O.:295; I.V.:1100; IV Piggyback:1000] Out: 2550 [Urine:2550]  Labs: Results for TONJIA, PARILLO (MRN [id number]) as of 06/04/2011 12:54  Ref. Range 06/01/2011 21:06 06/04/2011 06:16  Vancomycin Rm No range found 8.4   Vancomycin Tr Latest Range: 10.0-20.0 ug/mL  14.0      Basename 06/04/11 0616 06/03/11 0533 06/02/11 0514  WBC -- -- --  HGB -- -- --  PLT -- -- --  LABCREA -- -- --  CREATININE 0.55 0.69 0.66  CRCLEARANCE -- -- --   Microbiology: Recent Results (from the past 720 hour(s))  WOUND CULTURE     Status: Normal   Collection Time   05/29/11  2:20 PM      Component Value Range Status Comment   Specimen Description LEG   Final    Special Requests Normal   Final    Gram Stain     Final    Value: NO WBC SEEN     NO SQUAMOUS EPITHELIAL CELLS SEEN     FEW GRAM POSITIVE RODS   Culture     Final    Value: MODERATE DIPHTHEROIDS(CORYNEBACTERIUM SPECIES)     Note: Standardized susceptibility testing for this organism is not available.   Report Status 05/31/2011 FINAL   Final    Medical History: Past Medical History  Diagnosis Date  . Thyroid disease   . Depression    Medications:  Prescriptions prior to admission  Medication Sig Dispense Refill  . ALPRAZolam (XANAX) 0.25 MG tablet Take 0.25 mg by mouth 3 (three) times daily. For anxiety      . furosemide (LASIX) 40 MG tablet Take 40 mg by mouth daily.        Marland Kitchen HYDROcodone-acetaminophen (NORCO) 10-325 MG per tablet Take 1-2 tablets by mouth every 6 (six) hours as needed. For pain      .  levothyroxine (SYNTHROID, LEVOTHROID) 150 MCG tablet Take 150 mcg by mouth every morning.        . methocarbamol (ROBAXIN) 500 MG tablet Take 500 mg by mouth every 6 (six) hours. For muscle spasms      . PARoxetine (PAXIL) 20 MG tablet Take 20 mg by mouth every evening.         Assessment:  Vancomycin therapy on going. Serum creatinine stable. Vancomycin trough drawn as a random (5 hours before due) and it was only 14.0 mcg/ml.  Goal of Therapy:   Vanco trough level 10-15  Plan:    Change Vancomycin to 1250 mg IV every 12 hours. Recheck level at steady state.  Caryl Asp 06/04/2011,12:54 PM

## 2011-06-04 NOTE — Op Note (Signed)
Report  History 77 yo female 3 weeks status post right total knee arthroplasty, in the postoperative period heard a pop in her knee and is on exam still had intact extension with a mild extensor lag. She then drained sanguinous fluid and was brought in to the hospital for possible infection versus hematoma. The patient continued to have a draining wound and it was decided that she should have exploration of the wound and evacuation of was thought to be hematoma.  Preop diagnosis seroma and hematoma right knee Postop diagnosis patellar tendon rupture Surgeon Romeo Apple Asst. Haydee Monica Spinal anesthetic Procedure evacuation of seroma and hematoma and primary repair of patellar tendon rupture with irrigation and debridement of the knee joint No tourniquet Cultures were obtained anaerobic and aerobic Counts were correct  Procedure after site marking and chart update the patient was taken to the operating room for spinal anesthetic she was then placed in the supine position.  Her right leg was prepped and draped in sterile technique  Timeout procedure was executed by all parties involved  The midline incision was made and serous fluid was noted to be in the subcutaneous tissue this tracked down to a patellar tendon rupture at the inferior pole of the patella. An arthrotomy was done medial approach and the joint was irrigated and cultures were obtained  The sinus tract area was debrided with sharp dissection.  Patella tendon was then repaired using #1 nylon suture #2 Ethibond suture through drill holes in the tibial tubercle.  The knee was irrigated again and closed with 0 Monocryl and #2 nylon suture  Sterile dressing was applied and he was placed in a knee immobilizer  Postoperative plan 6 weeks of extension bracing.

## 2011-06-04 NOTE — Anesthesia Procedure Notes (Addendum)
Spinal Block  Patient location during procedure: OR Start time: 06/04/2011 11:12 AM Staffing CRNA/Resident: Marylene Buerger Preanesthetic Checklist Completed: patient identified, site marked and IV checked Spinal Block Patient position: right lateral decubitus Prep: Betadine  Spinal Block  End time: 06/04/2011 11:39 AM Spinal Block Patient position: right lateral decubitus Prep: Betadine Location: L3-4 Injection technique: single-shot Needle Needle type: Spinocan  Needle gauge: 22 G Needle length: 10 cm Additional Notes [id number] 2012  -10 Marxcaine 12 mg Fentalyl 25 Mcg  Spinal Block

## 2011-06-04 NOTE — Progress Notes (Signed)
Physical Therapy Evaluation Patient Name: [name] WGNFA'O Date: 06/04/2011 Problem List:  Patient Active Problem List  Diagnoses  . Dehiscence of operative wound  . Patellar tendon rupture   Past Medical History:  Past Medical History  Diagnosis Date  . Thyroid disease   . Depression    Past Surgical History:  Past Surgical History  Procedure Date  . Total hip arthroplasty   . Replacement total knee bilateral     Precautions/Restrictions  Precautions Required Braces or Orthoses: Yes Knee Immobilizer: On at all times Restrictions Weight Bearing Restrictions: Yes RLE Weight Bearing: Weight bearing as tolerated Prior Functioning  Home Living Type of Home: House Lives With: Alone Receives Help From: Family Home Layout: One level Home Access: Ramped entrance Bathroom Shower/Tub: Nurse, adult Accessibility: Yes   Cognition Cognition Orientation Level: Oriented X4 Sensation/Coordination   Extremity Assessment   Mobility (including Balance) Bed Mobility Bed Mobility: Yes Rolling Right: 2: Max assist Rolling Left: 2: Max assist Left Sidelying to Sit: Not tested (comment) (Pt states she is in to much pain) Sitting - Scoot to Edge of Bed: Not tested (comment) (Pt states that she is in to much pain) Transfers Transfers: No Ambulation/Gait Ambulation/Gait: No Ambulation Distance (Feet): 0 Feet Stairs: No Wheelchair Mobility Wheelchair Mobility: No    Exercise   Per MD no exercises gait only  End of Session PT - End of Session Equipment Utilized During Treatment: Right knee immobilizer Activity Tolerance: Patient limited by pain Patient left: in bed General Behavior During Session: Restless Cognition: WFL for tasks performed PT Assessment/Plan/Recommendation PT Assessment Clinical Impression Statement: Pt is set on not going to SNF but this would be the safest discharge for pt. PT Recommendation/Assessment: Patient will need skilled PT  in the acute care venue PT Problem List: Decreased strength;Decreased mobility;Decreased activity tolerance;Pain Barriers to Discharge Comments: Pt does not want to consider skilled  PT Therapy Diagnosis : Difficulty walking;Generalized weakness;Acute pain PT Plan PT Frequency: 7X/week PT Treatment/Interventions: Gait training PT Recommendation Follow Up Recommendations: Home health PT Equipment Recommended: 3 in 1 bedside comode PT Goals  Acute Rehab PT Goals PT Goal Formulation: With patient Time For Goal Achievement: 5 days Pt will Roll Supine to Left Side: with modified independence PT Goal: Rolling Supine to Left Side - Progress: Not met PT Goal: Supine/Side to Sit - Progress: Not met PT Goal: Sit at Edge Of Bed - Progress: Not met Pt will go Sit to Supine/Side: with modified independence PT Goal: Sit to Supine/Side - Progress: Not met Pt will Transfer Sit to Stand/Stand to Sit: with modified independence PT Transfer Goal: Sit to Stand/Stand to Sit - Progress: Not met PT Goal: Ambulate - Progress: Not met Bennett Vanscyoc,CINDY 06/04/2011, 6:09 PM

## 2011-06-04 NOTE — Anesthesia Preprocedure Evaluation (Addendum)
Anesthesia Evaluation  Name, MR# and DOB Patient awake  General Assessment Comment  Reviewed: Allergy & Precautions, H&P , NPO status , Patient's Chart, lab work & pertinent test results  History of Anesthesia Complications Negative for: history of anesthetic complications  Airway Mallampati: I TM Distance: >3 FB Neck ROM: Full    Dental No notable dental hx.    Pulmonary    pulmonary exam normalPulmonary Exam Normal     Cardiovascular Regular Normal    Neuro/Psych    (+) PSYCHIATRIC DISORDERS, Depression,  Negative Neurological ROS    GI/Hepatic/Renal negative GI ROS  negative Liver ROS  negative Renal ROS        Endo/Other  (+) Hypothyroidism,      Abdominal Normal abdominal exam  (+)   Musculoskeletal  (+) Arthritis -, Osteoarthritis,    Hematology  (+) Blood dyscrasia, anemia, ,   Peds  Reproductive/Obstetrics    Anesthesia Other Findings            Anesthesia Physical Anesthesia Plan  ASA: III  Anesthesia Plan: Spinal   Post-op Pain Management:    Induction: Intravenous  Airway Management Planned: Nasal Cannula  Additional Equipment:   Intra-op Plan:   Post-operative Plan:   Informed Consent: I have reviewed the patients History and Physical, chart, labs and discussed the procedure including the risks, benefits and alternatives for the proposed anesthesia with the patient or authorized representative who has indicated his/her understanding and acceptance.     Plan Discussed with: CRNA  Anesthesia Plan Comments:         Anesthesia Quick Evaluation

## 2011-06-04 NOTE — Brief Op Note (Signed)
05/29/2011 - 06/04/2011  1:20 PM  PATIENT:  [name]  77 y.o. female  PRE-OPERATIVE DIAGNOSIS:  wound hematoma  POST-OPERATIVE DIAGNOSIS:  patella tendon rupture right knee   PROCEDURE:  Procedure(s): PATELLA TENDON REPAIR EVACUATION HEMATOMA  SURGEON:  Surgeon(s): Fuller Canada, MD  PHYSICIAN ASSISTANT: none   ASSISTANTS: none   ANESTHESIA:   spinal  OR FLUID I/O:  Total I/O In: 1300 [I.V.:1300] Out: 300 [Urine:250; Blood:50]  BLOOD ADMINISTERED:none  DRAINS: none   LOCAL MEDICATIONS USED:  NONE  SPECIMEN:  No Specimen  DISPOSITION OF SPECIMEN:  N/A  COUNTS:  YES  TOURNIQUET:  * No tourniquets in log *  DICTATION: .Dragon Dictation and Note written in EPIC  PLAN OF CARE: Admit to inpatient   PATIENT DISPOSITION:  PACU - hemodynamically stable.   Delay start of Pharmacological VTE agent (>24hrs) due to surgical blood loss or risk of bleeding:  yes

## 2011-06-04 NOTE — Anesthesia Postprocedure Evaluation (Signed)
  Anesthesia Post-op Note  Patient: [name]  Procedure(s) Performed:  PATELLA TENDON REPAIR; EVACUATION HEMATOMA - evacuation of seroma  Patient Location: PACU  Anesthesia Type: Spinal  Level of Consciousness: awake, alert  and oriented  Airway and Oxygen Therapy: Patient Spontanous Breathing and Patient connected to nasal cannula oxygen  Post-op Pain: none  Post-op Assessment: Post-op Vital signs reviewed, Patient's Cardiovascular Status Stable and Respiratory Function Stable  Post-op Vital Signs: Reviewed and stable  Complications: No apparent anesthesia complications

## 2011-06-05 LAB — CBC
MCH: 29.7 pg (ref 26.0–34.0)
MCHC: 32.1 g/dL (ref 30.0–36.0)
MCV: 92.6 fL (ref 78.0–100.0)
Platelets: 242 10*3/uL (ref 150–400)
RDW: 15.1 % (ref 11.5–15.5)

## 2011-06-05 LAB — BASIC METABOLIC PANEL
Calcium: 8 mg/dL — ABNORMAL LOW (ref 8.4–10.5)
Creatinine, Ser: 0.75 mg/dL (ref 0.50–1.10)
GFR calc Af Amer: >60 mL/min (ref >60)

## 2011-06-05 MED ORDER — CIPROFLOXACIN HCL 250 MG PO TABS
500.0000 mg | ORAL_TABLET | Freq: Two times a day (BID) | ORAL | Status: DC
  Administered 2011-06-05 – 2011-06-09 (×9): 500 mg via ORAL
  Filled 2011-06-05 (×2): qty 1
  Filled 2011-06-05 (×2): qty 2
  Filled 2011-06-05: qty 1
  Filled 2011-06-05: qty 2
  Filled 2011-06-05: qty 1
  Filled 2011-06-05 (×4): qty 2

## 2011-06-05 NOTE — Progress Notes (Signed)
Refusal of Treatment Sept. 8, 2012, 10:00 am  Pt. Refuses therapy on this date.  States she discussed with MD and OK to hold therapy today.  To resume on Monday, Sept. 10, 2012  Emeline Gins, PTA Sept. 8, 2012

## 2011-06-05 NOTE — Anesthesia Postprocedure Evaluation (Signed)
  Anesthesia Post-op Note  Patient: [name]  Procedure(s) Performed:  PATELLA TENDON REPAIR; EVACUATION HEMATOMA - evacuation of seroma  Patient Location: Room 337  Anesthesia Type: post sab  Level of Consciousness: awake, alert  and oriented  Airway and Oxygen Therapy: Patient Spontanous Breathing  Post-op Pain: mild  Post-op Assessment: Post-op Vital signs reviewed, Patient's Cardiovascular Status Stable and Respiratory Function Stable  Post-op Vital Signs: Reviewed and stable  Complications: No apparent anesthesia complications, in very good spirits and remaining positive.

## 2011-06-05 NOTE — Progress Notes (Signed)
Subjective: 1 Day Post-Op Procedure(s) (LRB): PATELLA TENDON REPAIR (Right) EVACUATION HEMATOMA (Right) Patient reports pain as moderate.    Objective: Vital signs in last 24 hours: Temp:  [97.5 F (36.4 C)-99.2 F (37.3 C)] 99.2 F (37.3 C) (09/08 0618) Pulse Rate:  [67-90] 90  (09/08 0618) Resp:  [10-25] 20  (09/08 0618) BP: (98-171)/(45-79) 98/62 mmHg (09/08 0618) SpO2:  [87 %-100 %] 90 % (09/08 0618) FiO2 (%):  [21 %] 21 % (09/07 2304)  Intake/Output from previous day: 09/07 0701 - 09/08 0700 In: 1670 [P.O.:270; I.V.:1400] Out: 975 [Urine:925; Blood:50] Intake/Output this shift:     Basename 06/05/11 0537  HGB 8.4*    Basename 06/05/11 0537  WBC 9.9  RBC 2.83*  HCT 26.2*  PLT 242    Basename 06/05/11 0537 06/04/11 0616  NA 134* 138  K 4.7 4.0  CL 100 98  CO2 31 30  BUN 11 8  CREATININE 0.75 0.55  GLUCOSE 110* 104*  CALCIUM 8.0* 8.8   No results found for this basename: LABPT:2,INR:2 in the last 72 hours  Neurologically intact Neurovascular intact Sensation intact distally  Assessment/Plan: 1 Day Post-Op Procedure(s) (LRB): PATELLA TENDON REPAIR (Right) EVACUATION HEMATOMA (Right) Advance diet  Fuller Canada 06/05/2011, 10:07 AM

## 2011-06-06 LAB — VANCOMYCIN, RANDOM: Vancomycin Rm: 12.5 ug/mL

## 2011-06-06 LAB — CBC
MCH: 29.5 pg (ref 26.0–34.0)
MCHC: 31.9 g/dL (ref 30.0–36.0)
RDW: 14.9 % (ref 11.5–15.5)

## 2011-06-06 NOTE — Progress Notes (Signed)
ANTIBIOTIC CONSULT NOTE   Pharmacy Consult for Vancomycin Indication: Recent Knee Surgery - Drainage / Dehisence  Patient Measurements: Height: 5\' 9"  (175.3 cm) Weight: 247 lb 12.8 oz (112.401 kg) IBW/kg (Calculated) : 66.2   Vital Signs: Temp: 98.3 F (36.8 C) (09/09 0600) Temp src: Oral (09/09 0600) BP: 109/66 mmHg (09/09 0600) Pulse Rate: 75  (09/09 0600) Intake/Output from previous day: 09/08 0701 - 09/09 0700 In: 960 [P.O.:960] Out: -   Labs: Vancomycin trough 06/06/11    12.5 mcg/ml     Basename 06/06/11 0422 06/05/11 0537 06/04/11 0616  WBC 8.0 9.9 --  HGB 8.4* 8.4* --  PLT 232 242 --  LABCREA -- -- --  CREATININE -- 0.75 0.55  CRCLEARANCE -- -- --   Microbiology: Recent Results (from the past 720 hour(s))  WOUND CULTURE     Status: Normal   Collection Time   05/29/11  2:20 PM      Component Value Range Status Comment   Specimen Description LEG   Final    Special Requests Normal   Final    Gram Stain     Final    Value: NO WBC SEEN     NO SQUAMOUS EPITHELIAL CELLS SEEN     FEW GRAM POSITIVE RODS   Culture     Final    Value: MODERATE DIPHTHEROIDS(CORYNEBACTERIUM SPECIES)     Note: Standardized susceptibility testing for this organism is not available.   Report Status 05/31/2011 FINAL   Final   WOUND CULTURE     Status: Normal (Preliminary result)   Collection Time   06/04/11 10:20 AM      Component Value Range Status Comment   Specimen Description KNEE RIGHT   Final    Special Requests NONE   Final    Gram Stain     Final    Value: ABUNDANT WBC PRESENT, PREDOMINANTLY PMN     NO SQUAMOUS EPITHELIAL CELLS SEEN     NO ORGANISMS SEEN   Culture NO GROWTH 1 DAY   Final    Report Status PENDING   Incomplete   ANAEROBIC CULTURE     Status: Normal (Preliminary result)   Collection Time   06/04/11 10:20 AM      Component Value Range Status Comment   Specimen Description KNEE RIGHT   Final    Special Requests NONE   Final    Gram Stain     Final    Value:  ABUNDANT WBC PRESENT, PREDOMINANTLY PMN     NO SQUAMOUS EPITHELIAL CELLS SEEN     NO ORGANISMS SEEN   Culture     Final    Value: NO ANAEROBES ISOLATED; CULTURE IN PROGRESS FOR 5 DAYS   Report Status PENDING   Incomplete    Medical History: Past Medical History  Diagnosis Date  . Thyroid disease   . Depression    Medications:  Prescriptions prior to admission  Medication Sig Dispense Refill  . ALPRAZolam (XANAX) 0.25 MG tablet Take 0.25 mg by mouth 3 (three) times daily. For anxiety      . furosemide (LASIX) 40 MG tablet Take 40 mg by mouth daily.        Marland Kitchen HYDROcodone-acetaminophen (NORCO) 10-325 MG per tablet Take 1-2 tablets by mouth every 6 (six) hours as needed. For pain      . levothyroxine (SYNTHROID, LEVOTHROID) 150 MCG tablet Take 150 mcg by mouth every morning.        . methocarbamol (ROBAXIN) 500 MG  tablet Take 500 mg by mouth every 6 (six) hours. For muscle spasms      . PARoxetine (PAXIL) 20 MG tablet Take 20 mg by mouth every evening.         Assessment:  Vancomycin therapy on going. Serum creatinine stable. Vancomycin trough was only 12.5 mcg/ml.  Goal of Therapy:   Vanco trough level 10-15  Plan:    Continue Vancomycin to 1250 mg IV every 12 hours.  Raquel James, Cuyler Vandyken Bennett 06/06/2011,8:36 AM

## 2011-06-06 NOTE — Progress Notes (Signed)
Subjective: 2 Days Post-Op Procedure(s) (LRB): PATELLA TENDON REPAIR (Right) EVACUATION HEMATOMA (Right) Patient reports pain as moderate.    Objective: Vital signs in last 24 hours: Temp:  [98.2 F (36.8 C)-98.9 F (37.2 C)] 98.3 F (36.8 C) (09/09 0600) Pulse Rate:  [73-93] 75  (09/09 0600) Resp:  [16-19] 18  (09/09 0600) BP: (103-121)/(58-69) 109/66 mmHg (09/09 0600) SpO2:  [86 %-92 %] 90 % (09/09 0600)  Intake/Output from previous day: 09/08 0701 - 09/09 0700 In: 960 [P.O.:960] Out: -  Intake/Output this shift:     Basename 06/06/11 0422 06/05/11 0537  HGB 8.4* 8.4*    Basename 06/06/11 0422 06/05/11 0537  WBC 8.0 9.9  RBC 2.85* 2.83*  HCT 26.3* 26.2*  PLT 232 242    Basename 06/05/11 0537 06/04/11 0616  NA 134* 138  K 4.7 4.0  CL 100 98  CO2 31 30  BUN 11 8  CREATININE 0.75 0.55  GLUCOSE 110* 104*  CALCIUM 8.0* 8.8   No results found for this basename: LABPT:2,INR:2 in the last 72 hours  Neurologically intact  Assessment/Plan: 2 Days Post-Op Procedure(s) (LRB): PATELLA TENDON REPAIR (Right) EVACUATION HEMATOMA (Right) Advance diet  [name] 06/06/2011, 9:33 AM

## 2011-06-07 LAB — WOUND CULTURE: Culture: NO GROWTH

## 2011-06-07 LAB — CBC
MCH: 28.8 pg (ref 26.0–34.0)
Platelets: 171 10*3/uL (ref 150–400)
RBC: 2.57 MIL/uL — ABNORMAL LOW (ref 3.87–5.11)
RDW: 14.5 % (ref 11.5–15.5)
WBC: 6.6 10*3/uL (ref 4.0–10.5)

## 2011-06-07 NOTE — Progress Notes (Signed)
Occupational Therapy Treatment Patient Name: [name] Today's Date: 06/07/2011  Time in 910  Time out  9:55 Problem List:  Patient Active Problem List  Diagnoses  . Dehiscence of operative wound  . Patellar tendon rupture   Past Medical History:  Past Medical History  Diagnosis Date  . Thyroid disease   . Depression    Past Surgical History:  Past Surgical History  Procedure Date  . Total hip arthroplasty   . Replacement total knee bilateral     Precautions/Restrictions   Patient now non weight bearing on right LE.   06/07/11 1100  OT Visit Information  Last OT Received On 06/07/11  Precautions  Precautions Fall  Required Braces or Orthoses Yes  Knee Immobilizer On at all times  Restrictions  Weight Bearing Restrictions Yes  RLE Weight Bearing NWB  ADL  Toilet Transfer Performed;Minimal assistance  Toilet Transfer Method Other (comment) (sliding over without the board, using drop arm commode)  Toilet Transfer Equipment Drop arm bedside commode  Toileting - Hygiene Set up  Where Assessed - Toileting Hygiene Sit on 3-in-1 or toilet  Transfers  Transfers Yes  Sit to Stand Other (comment)  General Exercises - Upper Extremity  Chair Push Up 5 reps  OT - End of Session  Equipment Utilized During Treatment Gait belt  Activity Tolerance Patient tolerated treatment well  Patient left in chair;with call bell in reach  Nurse Communication Mobility status for transfers  General  Behavior During Session [facility] for tasks performed  Cognition [facility] for tasks performed  OT Assessment/Plan  Comments on Treatment Session Patients transfer from bed to drop arm commode and then to wheelchair with removeable arms with CGA  OT Plan Discharge plan remains appropriate  Follow Up Recommendations Home health OT  Equipment Recommended 3 in 1 bedside comode;Wheelchair (measurements) (needs both with removeable/drop arms)         OT Assessment/Plan OT Assessment/Plan OT  Frequency: Min 2X/week OT Goals Miscellaneous OT Goals OT Goal: Miscellaneous Goal #1 - Progress: Progressing toward goals OT Goal: Miscellaneous Goal #2 - Progress: Other (comment) (patient now non-wt bearing on right)  Noralee Stain, Sharese Manrique L  06/07/2011, 4:10 PM

## 2011-06-07 NOTE — Progress Notes (Signed)
Physical Therapy Treatment Patient Name: [name] [id number]'[id number] Date: 06/07/2011  TIME: 915-930 CHARGES: i TE Problem List:  Patient Active Problem List  Diagnoses  . Dehiscence of operative wound  . Patellar tendon rupture   Past Medical History:  Past Medical History  Diagnosis Date  . Thyroid disease   . Depression    Past Surgical History:  Past Surgical History  Procedure Date  . Total hip arthroplasty   . Replacement total knee bilateral    Precautions/Restrictions  Precautions Precautions: Fall Required Braces or Orthoses: Yes Knee Immobilizer: On at all times Restrictions Weight Bearing Restrictions: Yes RLE Weight Bearing: Weight bearing as tolerated Mobility (including Balance) Bed Mobility Sitting - Scoot to Edge of Bed: 4: Min assist Sitting - Scoot to Edge of Bed Details (indicate cue type and reason): only slight assistance needed with RLE Sit to Supine - Left: 5: Supervision;4: Min assist Sit to Supine - Left Details (indicate cue type and reason): assistance needed only for RLE Transfers Lateral/Scoot Transfers: 5: Supervision Lateral/Scoot Transfer Details (indicate cue type and reason): vc's for hand placement to [facility]    Exercise  Total Joint Exercises Ankle Circles/Pumps: Right;20 reps Quad Sets: Both;10 reps Short Arc Quad: Left (2 x10) Hip ABduction/ADduction: Left;20 reps Straight Leg Raises: Left;10 reps General Exercises - Lower Extremity Ankle Circles/Pumps: Right;20 reps Quad Sets: Both;10 reps Short Arc Quad: Left (2 x10) Hip ABduction/ADduction: Left;20 reps Straight Leg Raises: Left;10 reps Low Level/ICU Exercises Ankle Circles/Pumps: Right;20 reps Quad Sets: Both;10 reps Short Arc Quad: Left (2 x10) Hip ABduction/ADduction: Left;20 reps  End of Session PT - End of Session Activity Tolerance: Patient tolerated treatment well Patient left:  (Pt on BSC with OT) General Behavior During Session: WFL for tasks  performed Cognition: WFL for tasks performed PT Assessment/Plan  PT - Assessment/Plan Comments on Treatment Session: Pt was able was Min A (vc's for hand placement only) for sit<>EOB<>BSC; pt left on BSC with OT;nursing notified of pt requesting enema; PT Goals  Acute Rehab PT Goals PT Goal: Supine/Side to Sit - Progress: Progressing toward goal  Lynze Reddy ATKINSO 06/07/2011, 9:52 AM

## 2011-06-07 NOTE — Progress Notes (Signed)
Subjective: 3 Days Post-Op Procedure(s) (LRB): PATELLA TENDON REPAIR (Right) EVACUATION HEMATOMA (Right) Patient reports pain as mild.    Objective: Vital signs in last 24 hours: Temp:  [98 F (36.7 C)-98.7 F (37.1 C)] 98 F (36.7 C) (09/10 0600) Pulse Rate:  [76-93] 91  (09/10 0600) Resp:  [16-20] 17  (09/10 0759) BP: (114-149)/(64-72) 149/72 mmHg (09/10 0600) SpO2:  [91 %-95 %] 95 % (09/10 0759)  Intake/Output from previous day: 09/09 0701 - 09/10 0700 In: 4570 [P.O.:440; I.V.:2880; IV Piggyback:1250] Out: 1575 [Urine:1575] Intake/Output this shift: Total I/O In: 120 [I.V.:120] Out: -    Basename 06/07/11 0440 06/06/11 0422 06/05/11 0537  HGB 7.4* 8.4* 8.4*    Basename 06/07/11 0440 06/06/11 0422  WBC 6.6 8.0  RBC 2.57* 2.85*  HCT 24.4* 26.3*  PLT 171 232    Basename 06/05/11 0537  NA 134*  K 4.7  CL 100  CO2 31  BUN 11  CREATININE 0.75  GLUCOSE 110*  CALCIUM 8.0*   No results found for this basename: LABPT:2,INR:2 in the last 72 hours  Neurologically intact Sensation intact distally Intact pulses distally Dorsiflexion/Plantar flexion intact Incision: dressing C/D/I  Assessment/Plan: 3 Days Post-Op Procedure(s) (LRB): PATELLA TENDON REPAIR (Right) EVACUATION HEMATOMA (Right) Up with therapy Discharge home with home health in 2 days  May need blood for acute blood loss anemia post op   Cx: initially negative   Fuller Canada 06/07/2011, 2:26 PM

## 2011-06-07 NOTE — Progress Notes (Signed)
Physical Therapy Treatment Patient Name: [name] [id number] Date: 06/07/2011 TIME:1045-1100 Problem List:  Patient Active Problem List  Diagnoses  . Dehiscence of operative wound  . Patellar tendon rupture   Past Medical History:  Past Medical History  Diagnosis Date  . Thyroid disease   . Depression    Past Surgical History:  Past Surgical History  Procedure Date  . Total hip arthroplasty   . Replacement total knee bilateral    Precautions/Restrictions  Precautions Precautions: Fall Required Braces or Orthoses: Yes Knee Immobilizer: On at all times Restrictions Weight Bearing Restrictions: Yes RLE Weight Bearing: Non weight bearing Mobility (including Balance) Bed Mobility Sitting - Scoot to Edge of Bed: 4: Min assist Sitting - Scoot to Edge of Bed Details (indicate cue type and reason): only slight assistance needed with RLE Sit to Supine - Left: 4: Min assist Sit to Supine - Left Details (indicate cue type and reason): assitance needed with RLE Transfers Lateral/Scoot Transfers: 6: Modified independent (Device/Increase time) Lateral/Scoot Transfer Details (indicate cue type and reason): BSC<>bed    Exercise  Total Joint Exercises Ankle Circles/Pumps: Right;20 reps Quad Sets: Both;10 reps Short Arc Quad: Left (2 x10) Hip ABduction/ADduction: Left;20 reps Straight Leg Raises: Left;10 reps General Exercises - Lower Extremity Ankle Circles/Pumps: Right;20 reps Quad Sets: Both;10 reps Short Arc Quad: Left (2 x10) Hip ABduction/ADduction: Left;20 reps Straight Leg Raises: Left;10 reps Low Level/ICU Exercises Ankle Circles/Pumps: Right;20 reps Quad Sets: Both;10 reps Short Arc Quad: Left (2 x10) Hip ABduction/ADduction: Left;20 reps  End of Session PT - End of Session Activity Tolerance: Patient tolerated treatment well Patient left: in bed;with call bell in reach Nurse Communication: Mobility status for transfers General Behavior During Session: [facility]  for tasks performed Cognition: [facility] for tasks performed PT Assessment/Plan  PT - Assessment/Plan Comments on Treatment Session: Pt was MI for BDC<>BED and sit supine;assistance needed for scooting to [facility] trapeze bar used  PT Goals  Acute Rehab PT Goals PT Goal: Supine/Side to Sit - Progress: Progressing toward goal  Gaylin Bulthuis ATKINSO 06/07/2011, 11:12 AM

## 2011-06-08 LAB — BASIC METABOLIC PANEL
CO2: 33 mEq/L — ABNORMAL HIGH (ref 19–32)
Calcium: 8.5 mg/dL (ref 8.4–10.5)
Chloride: 100 mEq/L (ref 96–112)
GFR calc Af Amer: >60 mL/min (ref >60)
Sodium: 140 mEq/L (ref 135–145)

## 2011-06-08 NOTE — Progress Notes (Signed)
Physical Therapy Treatment Patient Name: [name] [id number] Date: 06/08/2011 TIME: [id number] Problem List:  Patient Active Problem List  Diagnoses  . Dehiscence of operative wound  . Patellar tendon rupture   Past Medical History:  Past Medical History  Diagnosis Date  . Thyroid disease   . Depression    Past Surgical History:  Past Surgical History  Procedure Date  . Total hip arthroplasty   . Replacement total knee bilateral    Precautions/Restrictions  Precautions Precautions: Fall Required Braces or Orthoses: Yes Knee Immobilizer: On at all times Restrictions Weight Bearing Restrictions: Yes RLE Weight Bearing: Non weight bearing Mobility (including Balance) Bed Mobility Sitting - Scoot to Edge of Bed: 6: Modified independent (Device/Increase time) Sit to Supine - Left: 6: Modified independent (Device/Increase time) Transfers Sit to Stand: 3: Mod assist Sit to Stand Details (indicate cue type and reason): verbal/tactile cues for pt to lift chest upon standing;pt severely flexes trunk which puts her COG anterior and puts her at risk for falling Stand to Sit: 4: Min assist Stand to Sit Details: contolling descent Lateral/Scoot Transfers: 6: Modified independent (Device/Increase time) Ambulation/Gait Ambulation/Gait: No Stairs: No Wheelchair Mobility Wheelchair Mobility: No    Exercise  Total Joint Exercises Quad Sets: Left;10 reps Short Arc Quad: Left;10 reps Heel Slides: Left;10 reps Hip ABduction/ADduction: Left;10 reps Straight Leg Raises: Left;10 reps Long Arc Quad: Left;10 reps General Exercises - Lower Extremity Quad Sets: Left;10 reps Short Arc Quad: Left;10 reps Long Arc Quad: Left;10 reps Heel Slides: Left;10 reps Hip ABduction/ADduction: Left;10 reps Straight Leg Raises: Left;10 reps Toe Raises: Left;10 reps Heel Raises: Left;10 reps Low Level/ICU Exercises Quad Sets: Left;10 reps Short Arc Quad: Left;10 reps Hip ABduction/ADduction:  Left;10 reps Heel Slides: Left;10 reps Other Exercises Other Exercises: sit<>stand x4 for strengthening and instruction to stand safely  End of Session PT - End of Session Equipment Utilized During Treatment: Gait belt (RW.) Activity Tolerance: Patient limited by fatigue (anxiety;fearful of falling) Patient left: in bed;with call bell in reach General Behavior During Session: [facility] for tasks performed Cognition: [facility] for tasks performed PT Assessment/Plan  PT - Assessment/Plan Comments on Treatment Session: Pt was able to stand at bedside with Mod A today at bedside with RW;pt is very unsafe sit<>stand due to severly flexed trunk which places her COG anteriorly and causes her to be very unsteady when standing'pt states she is terriefied of falling again and that is why she flexes her trunk;educated pt that she is putting herself at a high risk of  falling forward with her current compensations PT Goals  Acute Rehab PT Goals PT Goal: Rolling Supine to Left Side - Progress: Met PT Goal: Supine/Side to Sit - Progress: Met PT Goal: Sit at Edge Of Bed - Progress: Met PT Goal: Sit to Supine/Side - Progress: Met PT Transfer Goal: Sit to Stand/Stand to Sit - Progress: Progressing toward goal PT Goal: Ambulate - Progress: Progressing toward goal  [name] 06/08/2011, 12:05 PM

## 2011-06-08 NOTE — Progress Notes (Signed)
Subjective: 4 Days Post-Op Procedure(s) (LRB): PATELLA TENDON REPAIR (Right) EVACUATION HEMATOMA (Right) Patient reports pain as mild.    Objective: Vital signs in last 24 hours: Temp:  [98.4 F (36.9 C)-99.6 F (37.6 C)] 98.4 F (36.9 C) (09/11 0635) Pulse Rate:  [75-83] 80  (09/11 0635) Resp:  [18-20] 20  (09/11 0635) BP: (110-132)/(64-73) 132/69 mmHg (09/11 0635) SpO2:  [95 %-98 %] 98 % (09/11 0635)  Intake/Output from previous day: 09/10 0701 - 09/11 0700 In: 809 [P.O.:60; I.V.:499; IV Piggyback:250] Out: 1750 [Urine:1750] Intake/Output this shift: Total I/O In: 240 [P.O.:240] Out: 600 [Urine:600]   Basename 06/07/11 0440 06/06/11 0422  HGB 7.4* 8.4*    Basename 06/07/11 0440 06/06/11 0422  WBC 6.6 8.0  RBC 2.57* 2.85*  HCT 24.4* 26.3*  PLT 171 232    Basename 06/08/11 0511  NA 140  K 3.6  CL 100  CO2 33*  BUN 7  CREATININE 0.65  GLUCOSE 94  CALCIUM 8.5   No results found for this basename: LABPT:2,INR:2 in the last 72 hours  Neurologically intact  Assessment/Plan: 4 Days Post-Op Procedure(s) (LRB): PATELLA TENDON REPAIR (Right) EVACUATION HEMATOMA (Right) Discharge home with home health in am with bedside potty chair with adjustable arms   Fuller Canada 06/08/2011, 12:32 PM

## 2011-06-09 ENCOUNTER — Telehealth: Payer: Self-pay | Admitting: Orthopedic Surgery

## 2011-06-09 ENCOUNTER — Other Ambulatory Visit: Payer: Self-pay | Admitting: *Deleted

## 2011-06-09 ENCOUNTER — Other Ambulatory Visit: Payer: Self-pay | Admitting: Orthopedic Surgery

## 2011-06-09 DIAGNOSIS — B999 Unspecified infectious disease: Secondary | ICD-10-CM

## 2011-06-09 LAB — ANAEROBIC CULTURE

## 2011-06-09 MED ORDER — CIPROFLOXACIN HCL 500 MG PO TABS: 500.0000 mg | ORAL_TABLET | Freq: Two times a day (BID) | ORAL | Status: AC

## 2011-06-09 MED ORDER — DSS 100 MG PO CAPS: 100.0000 mg | ORAL_CAPSULE | Freq: Two times a day (BID) | ORAL | Status: AC

## 2011-06-09 MED ORDER — POLYETHYLENE GLYCOL 3350 17 G PO PACK: 17.0000 g | PACK | Freq: Every day | ORAL | Status: AC | PRN

## 2011-06-09 MED ORDER — CIPROFLOXACIN HCL 500 MG PO TABS: 500.0000 mg | ORAL_TABLET | Freq: Two times a day (BID) | ORAL | Status: DC

## 2011-06-09 MED ORDER — HYDROCODONE-ACETAMINOPHEN 10-325 MG PO TABS: 1.0000 | ORAL_TABLET | Freq: Four times a day (QID) | ORAL | Status: DC | PRN

## 2011-06-09 NOTE — Progress Notes (Signed)
UR Chart Review Completed  

## 2011-06-09 NOTE — Telephone Encounter (Signed)
Routed and done, per nurse.

## 2011-06-09 NOTE — Progress Notes (Signed)
Discharge Summary: Pt. A/o.VSS. Ace wrap with immobilizer to right knee intact. Picc line intact. Discharge instructions given. Pt verbalized understanding of instructions. Left floor via wheelchair with [facility] transportation. Home health to follow pt after discharge.

## 2011-06-09 NOTE — Discharge Summary (Addendum)
Physician Discharge Summary  Patient ID: [name] MRN: [id number] DOB/AGE: [birth year] 77 y.o.  Admit date: 05/29/2011 Discharge date: 06/09/2011  Admission Diagnoses: Postoperative wound DEHISCENCE possible periprosthetic knee joint infection  Discharge Diagnoses: Patellar tendon rupture Active Problems:  Patellar tendon rupture   Discharged Condition: stable  Hospital Course: The patient was admitted on September first with drainage from the right knee. She was started on vancomycin IV and a PICC line was placed. On September 7, She then underwent wound exploration irrigation debridement intraoperative culture which revealed a patellar tendon rupture which was treated with primary repair. It was noted that the patient had cellulitis of the foot and ankle which was thought to be secondary to venous stasis disease. She was started on Cipro to treat that and improved.   Consults: none  Significant Diagnostic Studies: labs: HG 7.4;  wound cultures negative.  Treatments: antibiotics: vancomycin and Cipro  Discharge Exam: Blood pressure 110/68, pulse 71, temperature 97.8 F (36.6 C), temperature source Oral, resp. rate 20, height 5\' 9"  (1.753 m), weight 247 lb 12.8 oz (112.401 kg), SpO2 93.00%. Incision/Wound: clean dry and intact with minimal drainage  Disposition: Home with home health for dressing changes.  Discharge Orders    Future Orders Please Complete By Expires   DME Bedside commode      Comments:   WITH COLLAPSABLE ARMS   Diet - low sodium heart healthy      Call MD / Call 911      Comments:   If you experience chest pain or shortness of breath, CALL 911 and be transported to the hospital emergency room.  If you develope a fever above 101 F, pus (white drainage) or increased drainage or redness at the wound, or calf pain, call your surgeon's office.   Constipation Prevention      Comments:   Drink plenty of fluids.  Prune juice may be helpful.  You may use a  stool softener, such as Colace (over the counter) 100 mg twice a day.  Use MiraLax (over the counter) for constipation as needed.   Increase activity slowly as tolerated      Weight Bearing as taught in Physical Therapy      Comments:   Use a walker or crutches as instructed.   Discharge instructions      Comments:   KEEP KNEE STRAIGHT AT ALL TIMES   WHEN YOU REMOVE THE BRACE DO NOT BEND THE KNEE   DO NOT WALK WITHOUT THE BRACE ON   Do not put a pillow under the knee. Place it under the heel.        Current Discharge Medication List    START taking these medications   Details  ciprofloxacin (CIPRO) 500 MG tablet Take 1 tablet (500 mg total) by mouth 2 (two) times daily.    docusate sodium 100 MG CAPS Take 100 mg by mouth 2 (two) times daily. Qty: 10 capsule    polyethylene glycol (MIRALAX / GLYCOLAX) packet Take 17 g by mouth daily as needed. Qty: 14 each      CONTINUE these medications which have CHANGED   Details  HYDROcodone-acetaminophen (NORCO) 10-325 MG per tablet Take 1-2 tablets by mouth every 6 (six) hours as needed. For pain Qty: 60 tablet, Refills: 5      CONTINUE these medications which have NOT CHANGED   Details  ALPRAZolam (XANAX) 0.25 MG tablet Take 0.25 mg by mouth 3 (three) times daily. For anxiety    furosemide (LASIX) 40  MG tablet Take 40 mg by mouth daily.      levothyroxine (SYNTHROID, LEVOTHROID) 150 MCG tablet Take 150 mcg by mouth every morning.      methocarbamol (ROBAXIN) 500 MG tablet Take 500 mg by mouth every 6 (six) hours. For muscle spasms    PARoxetine (PAXIL) 20 MG tablet Take 20 mg by mouth every evening.         Follow-up Information    Follow up with Fuller Canada, MD. Call on 06/10/2011. (MAKE APPOINTMENT FOR 06-16-2011)    Contact information:   [address], [address] [place] [address] [phone]          Signed: Fuller Canada 06/09/2011, 8:20 AM

## 2011-06-09 NOTE — Progress Notes (Signed)
ANTIBIOTIC CONSULT NOTE   Pharmacy Consult for Vancomycin Indication: Recent Knee Surgery - Drainage / Dehisence  Patient Measurements: Height: 5\' 9"  (175.3 cm) Weight: 247 lb 12.8 oz (112.401 kg) IBW/kg (Calculated) : 66.2   Vital Signs: Temp: 97.8 F (36.6 C) (09/11 2246) BP: 110/68 mmHg (09/12 0620) Pulse Rate: 71  (09/12 0620) Intake/Output from previous day: 09/11 0701 - 09/12 0700 In: 1184.3 [P.O.:430; I.V.:254.3; IV Piggyback:500] Out: 3100 [Urine:3100]  Labs: Vancomycin trough 06/06/11    12.5 mcg/ml     Basename 06/08/11 0511 06/07/11 0440  WBC -- 6.6  HGB -- 7.4*  PLT -- 171  LABCREA -- --  CREATININE 0.65 --  CRCLEARANCE -- --   Microbiology: Recent Results (from the past 720 hour(s))  WOUND CULTURE     Status: Normal   Collection Time   05/29/11  2:20 PM      Component Value Range Status Comment   Specimen Description LEG   Final    Special Requests Normal   Final    Gram Stain     Final    Value: NO WBC SEEN     NO SQUAMOUS EPITHELIAL CELLS SEEN     FEW GRAM POSITIVE RODS   Culture     Final    Value: MODERATE DIPHTHEROIDS(CORYNEBACTERIUM SPECIES)     Note: Standardized susceptibility testing for this organism is not available.   Report Status 05/31/2011 FINAL   Final   WOUND CULTURE     Status: Normal   Collection Time   06/04/11 10:20 AM      Component Value Range Status Comment   Specimen Description KNEE RIGHT   Final    Special Requests NONE   Final    Gram Stain     Final    Value: ABUNDANT WBC PRESENT, PREDOMINANTLY PMN     NO SQUAMOUS EPITHELIAL CELLS SEEN     NO ORGANISMS SEEN   Culture NO GROWTH 2 DAYS   Final    Report Status 06/07/2011 FINAL   Final   ANAEROBIC CULTURE     Status: Normal (Preliminary result)   Collection Time   06/04/11 10:20 AM      Component Value Range Status Comment   Specimen Description KNEE RIGHT   Final    Special Requests NONE   Final    Gram Stain     Final    Value: ABUNDANT WBC PRESENT, PREDOMINANTLY PMN      NO SQUAMOUS EPITHELIAL CELLS SEEN     NO ORGANISMS SEEN   Culture     Final    Value: NO ANAEROBES ISOLATED; CULTURE IN PROGRESS FOR 5 DAYS   Report Status PENDING   Incomplete    Assessment:  Vancomycin therapy on going. Serum creatinine stable. Vancomycin trough was 12.5 mcg/ml.  Goal of Therapy:   Vanco trough level 10-15  Plan:   Continue Vancomycin to 1250 mg IV every 12 hours. Repeat trough 9/16 if therapy continues.  [name] 06/09/2011,7:27 AM

## 2011-06-09 NOTE — Telephone Encounter (Addendum)
Erica from floor 300 at [facility], [place] [phone], called back stating that "cannot find the prescription in chart" for: Cipro 500mg , 1 tablet by mouth 2 times daily; pharmacy is [facility] in [place].   * Update * Tiffany, nurse from [facility], called back 11:40am, states patient is ready to be discharged.  Asking if we can please fax or phone in this prescription to [facility] with request for delivery.

## 2011-06-09 NOTE — Telephone Encounter (Signed)
PLEASE ROUTE THESE TO OUR WONDERFUL NURSE   APPROVE BY THE [facility]

## 2011-06-09 NOTE — Progress Notes (Signed)
Subjective: 5 Days Post-Op Procedure(s) (LRB): PATELLA TENDON REPAIR (Right) EVACUATION HEMATOMA (Right) Patient reports pain as mild.    Objective: Vital signs in last 24 hours: Temp:  [97.8 F (36.6 C)-98.5 F (36.9 C)] 97.8 F (36.6 C) (09/11 2246) Pulse Rate:  [71-86] 71  (09/12 0620) Resp:  [20] 20  (09/12 0620) BP: (110-130)/(68-72) 110/68 mmHg (09/12 0620) SpO2:  [93 %-97 %] 93 % (09/12 0620)  Intake/Output from previous day: 09/11 0701 - 09/12 0700 In: 1184.3 [P.O.:430; I.V.:254.3; IV Piggyback:500] Out: 3100 [Urine:3100] Intake/Output this shift:     Basename 06/07/11 0440  HGB 7.4*    Basename 06/07/11 0440  WBC 6.6  RBC 2.57*  HCT 24.4*  PLT 171    Basename 06/08/11 0511  NA 140  K 3.6  CL 100  CO2 33*  BUN 7  CREATININE 0.65  GLUCOSE 94  CALCIUM 8.5   No results found for this basename: LABPT:2,INR:2 in the last 72 hours  Neurologically intact  Assessment/Plan: 5 Days Post-Op Procedure(s) (LRB): PATELLA TENDON REPAIR (Right) EVACUATION HEMATOMA (Right) Discharge home with home health  Fuller Canada 06/09/2011, 8:29 AM

## 2011-06-10 ENCOUNTER — Telehealth: Payer: Self-pay | Admitting: Orthopedic Surgery

## 2011-06-10 ENCOUNTER — Encounter (HOSPITAL_COMMUNITY): Payer: Self-pay | Admitting: Orthopedic Surgery

## 2011-06-10 NOTE — Telephone Encounter (Signed)
Leave picc line in   Flush per protocol   Dr will remove it

## 2011-06-10 NOTE — Telephone Encounter (Signed)
Relayed doctor's message to [facility]. 1.  She still needs a prescription sent to [facility] pharmacy for the daily flushes of the pic line.  2.  Do you want [name] to change the dressing on [place]';s incision?

## 2011-06-10 NOTE — Telephone Encounter (Signed)
[name]/[facility] called about [name].  [name] still has the pic line in but she does not have any orders on the pic line.  Is she to get any antibiotics, or do you want it removed?   If it stays in, [name] will need a prescription for daily flushes.  If needed, please call in to [facility]. [name]'s # [phone]

## 2011-06-11 ENCOUNTER — Telehealth: Payer: Self-pay | Admitting: Orthopedic Surgery

## 2011-06-14 ENCOUNTER — Encounter (HOSPITAL_COMMUNITY): Payer: Self-pay | Admitting: Orthopedic Surgery

## 2011-06-14 ENCOUNTER — Telehealth: Payer: Self-pay | Admitting: *Deleted

## 2011-06-14 NOTE — Telephone Encounter (Signed)
Faxed order to [facility] for daily flushes of PICC line.

## 2011-06-15 ENCOUNTER — Ambulatory Visit: Payer: Medicare Other | Admitting: Orthopedic Surgery

## 2011-06-16 ENCOUNTER — Encounter: Payer: Self-pay | Admitting: Orthopedic Surgery

## 2011-06-16 ENCOUNTER — Ambulatory Visit (INDEPENDENT_AMBULATORY_CARE_PROVIDER_SITE_OTHER): Payer: Medicare Other | Admitting: Orthopedic Surgery

## 2011-06-16 DIAGNOSIS — Z96659 Presence of unspecified artificial knee joint: Secondary | ICD-10-CM

## 2011-06-16 DIAGNOSIS — S86819A Strain of other muscle(s) and tendon(s) at lower leg level, unspecified leg, initial encounter: Secondary | ICD-10-CM

## 2011-06-16 DIAGNOSIS — S838X9A Sprain of other specified parts of unspecified knee, initial encounter: Secondary | ICD-10-CM

## 2011-06-16 NOTE — Progress Notes (Signed)
status post exploration RIGHT knee replacement for presumed infection  We found patella tendon rupture incomplete RIGHT knee.  Culture-negative.  Patient had a PICC line for several days with vancomycin.  Currently on Cipro.  Patella tendon was repaired  Patient placed in brace  Sutures are partially removed today we'll need the rest of them removed by home health on Saturday  She has some scant drainage  Placed in T. Scope brace  I informed the patient that she may need a patellar tendon graft to salvage her knee.  She seemed to understand.  Come back 2 weeks recheck knee, incision.  Wear brace 6 weeks and test extensor mechanism

## 2011-06-16 NOTE — Patient Instructions (Signed)
Keep brace on  Weight bearing in brace as tolerated

## 2011-06-17 ENCOUNTER — Telehealth: Payer: Self-pay | Admitting: Pulmonary Disease

## 2011-06-28 NOTE — Telephone Encounter (Signed)
No additional note

## 2011-06-30 ENCOUNTER — Ambulatory Visit (INDEPENDENT_AMBULATORY_CARE_PROVIDER_SITE_OTHER): Payer: Medicare Other | Admitting: Orthopedic Surgery

## 2011-06-30 DIAGNOSIS — B3731 Acute candidiasis of vulva and vagina: Secondary | ICD-10-CM

## 2011-06-30 DIAGNOSIS — T8140XA Infection following a procedure, unspecified, initial encounter: Secondary | ICD-10-CM

## 2011-06-30 DIAGNOSIS — T8149XA Infection following a procedure, other surgical site, initial encounter: Secondary | ICD-10-CM

## 2011-06-30 DIAGNOSIS — B373 Candidiasis of vulva and vagina: Secondary | ICD-10-CM

## 2011-06-30 MED ORDER — CIPROFLOXACIN HCL 500 MG PO TABS: 500.0000 mg | ORAL_TABLET | Freq: Two times a day (BID) | ORAL | Status: DC

## 2011-06-30 MED ORDER — FLUCONAZOLE 150 MG PO TABS: 150.0000 mg | ORAL_TABLET | Freq: Every day | ORAL | Status: AC

## 2011-06-30 NOTE — Patient Instructions (Signed)
Continue cipro  Start difluccan (for yeast), start with 1 tablet wait 2 days if not cleared take the last 2 tablet one daily   Braced at all times

## 2011-06-30 NOTE — Progress Notes (Signed)
Status post exploration RIGHT knee replacement for presumed infection  We found patella tendon rupture incomplete RIGHT knee. Culture-negative. Patient had a PICC line for several days with vancomycin. Currently on Cipro.  Patella tendon was repaired  Patient placed in brace  Date of most recent surgery 06/04/2011  [name] incision looks clean dry and intact with no drainage.  The patient was able to extend the knee from a flexed position with gravity removed  She was placed back in a piece go brace and was able to perform a leg length with a 10 extensor lag at the brace on  She was informed that she is still not out of the wood yet. We'll give her the full 6 week. For healing and then test her knee again on next visit. She still has her PICC line in place, she should continue with oral Cipro

## 2011-07-01 ENCOUNTER — Telehealth: Payer: Self-pay | Admitting: Orthopedic Surgery

## 2011-07-01 NOTE — Telephone Encounter (Signed)
Denzil Magnuson, [facility] physical therapist, Cell ph [phone], requests verbal order for continuation of home therapy for 2 times per week for 3 more weeks, to increase weight bearing as tolerated and to ambulate, short distances at home.  Her next scheduled appointment here is 07/21/11 I can return call if you will please advise.

## 2011-07-02 NOTE — Telephone Encounter (Signed)
No response to note 07/01/11 as of yet, forward to nurse

## 2011-07-02 NOTE — Telephone Encounter (Signed)
Forwarded to Dr not sure about weightbearing,I was not in room with patient, and notes do not say

## 2011-07-05 NOTE — Telephone Encounter (Signed)
Weight bearing has not changed

## 2011-07-05 NOTE — Telephone Encounter (Signed)
Called and left message for [facility] of Dr Darden Amber

## 2011-07-21 ENCOUNTER — Ambulatory Visit (INDEPENDENT_AMBULATORY_CARE_PROVIDER_SITE_OTHER): Payer: Medicare Other | Admitting: Orthopedic Surgery

## 2011-07-21 ENCOUNTER — Encounter: Payer: Self-pay | Admitting: Orthopedic Surgery

## 2011-07-21 DIAGNOSIS — S838X9A Sprain of other specified parts of unspecified knee, initial encounter: Secondary | ICD-10-CM

## 2011-07-21 DIAGNOSIS — Z96659 Presence of unspecified artificial knee joint: Secondary | ICD-10-CM

## 2011-07-21 DIAGNOSIS — S86819A Strain of other muscle(s) and tendon(s) at lower leg level, unspecified leg, initial encounter: Secondary | ICD-10-CM

## 2011-07-21 NOTE — Progress Notes (Signed)
Status post exploration RIGHT knee replacement for presumed infection  We found patella tendon rupture incomplete RIGHT knee. Culture-negative. Patient had a PICC line for several days with vancomycin. Currently on Cipro.  Patella tendon was repaired  Patient placed in brace  Date of surgery 06/04/2011  48 DAYS POST OP   KNEE INCISION IS CLEAN AND RY WITHOUT ERYTHEMA  NO EXTENSION AGAINST GRAVITY BUT HAS EXTENSION WITH GRAVITY REMOVED   I ADVANCED BRACE TO 0-90  WB IN BRACE   3 WEEKS F/U

## 2011-07-21 NOTE — Patient Instructions (Signed)
Brace is now at 10-90   Weight bearing in brace as tolerated

## 2011-07-22 ENCOUNTER — Encounter: Payer: Self-pay | Admitting: Orthopedic Surgery

## 2011-07-23 ENCOUNTER — Telehealth: Payer: Self-pay | Admitting: Orthopedic Surgery

## 2011-07-23 NOTE — Telephone Encounter (Signed)
On please

## 2011-07-23 NOTE — Telephone Encounter (Signed)
[facility] called concerning Paige Kibble,  Do you want her brace on or off while doing quad work, they are doing open chain exercises only Ben"s # [phone]

## 2011-07-26 NOTE — Telephone Encounter (Signed)
Left a message on [name]'s cell pone to call me

## 2011-07-27 NOTE — Telephone Encounter (Signed)
error 

## 2011-08-04 NOTE — Telephone Encounter (Signed)
Phone call returned by C. Tucker/[facility]

## 2011-08-12 ENCOUNTER — Encounter: Payer: Self-pay | Admitting: Orthopedic Surgery

## 2011-08-12 ENCOUNTER — Ambulatory Visit (INDEPENDENT_AMBULATORY_CARE_PROVIDER_SITE_OTHER): Payer: Medicare Other | Admitting: Orthopedic Surgery

## 2011-08-12 DIAGNOSIS — Z96659 Presence of unspecified artificial knee joint: Secondary | ICD-10-CM

## 2011-08-12 DIAGNOSIS — S838X9A Sprain of other specified parts of unspecified knee, initial encounter: Secondary | ICD-10-CM

## 2011-08-12 DIAGNOSIS — S86819A Strain of other muscle(s) and tendon(s) at lower leg level, unspecified leg, initial encounter: Secondary | ICD-10-CM

## 2011-08-12 NOTE — Progress Notes (Signed)
Addended by: Fuller Canada E on: 08/12/2011 12:20 PM   Modules accepted: Level of Service

## 2011-08-12 NOTE — Progress Notes (Signed)
Status post exploration RIGHT knee replacement for presumed infection  We found patella tendon rupture incomplete RIGHT knee. Culture-negative. Patient had a PICC line for several days with vancomycin. Currently on Cipro.  Patella tendon was repaired  Patient placed in brace  Date of surgery 06/04/2011   It has now been over a week since the repair and the patient is not improving in a satisfactory manner.  She is a defect in the patellar tendon and a high riding patella.  I advised that she should have surgery with allograft reconstruction of the patellar tendon.  She was not anxious to do this once I told her the risk benefit ratio she decided to go ahead with the repair  Passive range of motion, 0-95.  Recommend allograft Reconstruction RIGHT patellar tendon.pmh  Past Surgical History  Procedure Date  . Tubal ligation   . Joint replacement 05/2010    left total knee Dr. Romeo Apple  . Total thyroidectomy March 2011    Dr. Suszanne Conners @ [facility]  . Left hip replacement @ [facility] '98   . Left total knee   . Total knee arthroplasty 05/03/2011    Procedure: TOTAL KNEE ARTHROPLASTY;  Surgeon: Fuller Canada, MD;  Location: [facility];  Service: Orthopedics;  Laterality: Right;  With DePuy  . Total hip arthroplasty   . Replacement total knee bilateral   . Patellar tendon repair 06/04/2011    Procedure: PATELLA TENDON REPAIR;  Surgeon: Fuller Canada, MD;  Location: [facility];  Service: Orthopedics;  Laterality: Right;  . Hematoma evacuation 06/04/2011    Procedure: EVACUATION HEMATOMA;  Surgeon: Fuller Canada, MD;  Location: [facility];  Service: Orthopedics;  Laterality: Right;  evacuation of seroma    History  Substance Use Topics  . Smoking status: Never Smoker   . Smokeless tobacco: Never Used  . Alcohol Use: No

## 2011-08-12 NOTE — Patient Instructions (Signed)
You have been scheduled for surgery.  All surgeries carry some risk.  Remember you always have the option of continued nonsurgical treatment. However in this situation the risks vs. the benefits favor surgery as the best treatment option. The risks of the surgery includes the following but is not limited to bleeding, infection, pulmonary embolus, death from anesthesia, nerve injury vascular injury or need for further surgery, continued pain.  Specific to this procedure the following risks and complications are rare but possible Knee stiffness, infection, 85%chance of success to restore knee stability

## 2011-08-13 ENCOUNTER — Telehealth: Payer: Self-pay | Admitting: Orthopedic Surgery

## 2011-08-13 NOTE — Telephone Encounter (Signed)
Patient called back after having seen Dr.Harrison yesterday, 08/12/11, and has concerns about "having to keep her leg stiff for 6 weeks" following her scheduled surgery.  She is asking how that will be done, and if she will need to be admitted to [facility] as she had done after previous surgery.  Or, can she be discharged home if she has help?  Her home ph# is [phone].  Please advise.

## 2011-08-16 ENCOUNTER — Encounter (HOSPITAL_COMMUNITY): Payer: Self-pay | Admitting: Pharmacy Technician

## 2011-08-16 NOTE — Telephone Encounter (Signed)
Knee will be braced as before  Determine discharge place after surgery

## 2011-08-16 NOTE — Telephone Encounter (Signed)
Called patient and relayed

## 2011-08-17 NOTE — Telephone Encounter (Signed)
Done, as noted 08/16/11

## 2011-08-18 ENCOUNTER — Other Ambulatory Visit: Payer: Self-pay | Admitting: Orthopedic Surgery

## 2011-08-23 ENCOUNTER — Telehealth: Payer: Self-pay | Admitting: Orthopedic Surgery

## 2011-08-23 NOTE — Telephone Encounter (Signed)
No pre-authorization required for surgery, inpatient scheduled 08/30/11 at [facility], per Medicare guidelines.

## 2011-08-23 NOTE — Telephone Encounter (Signed)
?  OK TO REFILL °

## 2011-08-24 ENCOUNTER — Telehealth: Payer: Self-pay | Admitting: Orthopedic Surgery

## 2011-08-24 ENCOUNTER — Encounter (HOSPITAL_COMMUNITY): Payer: Self-pay

## 2011-08-24 ENCOUNTER — Encounter (HOSPITAL_COMMUNITY)
Admission: RE | Admit: 2011-08-24 | Discharge: 2011-08-24 | Disposition: A | Discharge status: Home / Self Care | Payer: Medicare Other | Source: Ambulatory Visit | Attending: Orthopedic Surgery | Admitting: Orthopedic Surgery

## 2011-08-24 LAB — SURGICAL PCR SCREEN: Staphylococcus aureus: NEGATIVE

## 2011-08-24 LAB — APTT: aPTT: 34 seconds (ref 24–37)

## 2011-08-24 LAB — DIFFERENTIAL
Basophils Absolute: 0 10*3/uL (ref 0.0–0.1)
Lymphocytes Relative: 27 % (ref 12–46)
Monocytes Absolute: 0.3 10*3/uL (ref 0.1–1.0)
Neutro Abs: 3.4 10*3/uL (ref 1.7–7.7)

## 2011-08-24 LAB — CBC
HCT: 39.3 % (ref 36.0–46.0)
RDW: 14.3 % (ref 11.5–15.5)
WBC: 5.4 10*3/uL (ref 4.0–10.5)

## 2011-08-24 LAB — PROTIME-INR
INR: 0.92 (ref 0.00–1.49)
Prothrombin Time: 12.6 seconds (ref 11.6–15.2)

## 2011-08-24 LAB — BASIC METABOLIC PANEL
CO2: 31 mEq/L (ref 19–32)
Chloride: 104 mEq/L (ref 96–112)
Creatinine, Ser: 0.74 mg/dL (ref 0.50–1.10)
Sodium: 142 mEq/L (ref 135–145)

## 2011-08-24 NOTE — Patient Instructions (Addendum)
20 [name]  08/24/2011   Your procedure is scheduled on:  08/30/2011  Report to [facility] at 07:50 AM.  Call this number if you have problems the morning of surgery: [phone]   Remember:   Do not eat food:After Midnight.  May have clear liquids:until Midnight .  Clear liquids include soda, tea, black coffee, apple or grape juice, broth.  Take these medicines the morning of surgery with A SIP OF WATER: Xanax, Norco, Synthroid, and Paxil.   Do not wear jewelry, make-up or nail polish.  Do not wear lotions, powders, or perfumes. You may wear deodorant.  Do not shave 48 hours prior to surgery.  Do not bring valuables to the hospital.  Contacts, dentures or bridgework may not be worn into surgery.  Leave suitcase in the car. After surgery it may be brought to your room.  For patients admitted to the hospital, checkout time is 11:00 AM the day of discharge.   Patients discharged the day of surgery will not be allowed to drive home.  Name and phone number of your driver:   Special Instructions: CHG Shower Use Special Wash: 1/2 bottle night before surgery and 1/2 bottle morning of surgery.   Please read over the following fact sheets that you were given: Pain Booklet, MRSA Information, Surgical Site Infection Prevention, Anesthesia Post-op Instructions and Care and Recovery After Surgery

## 2011-08-24 NOTE — Telephone Encounter (Signed)
Aram Beecham from [facility] surgery called, ph [phone], with question about allograft for this Monday's surgery.  Please return call.

## 2011-08-25 NOTE — Telephone Encounter (Signed)
08/25/11 Dr. Romeo Apple spoke directly with representative Morrie Sheldon from [employer],company that is supplying the product, regarding this case;  Ashley's contact ph# is [phone].

## 2011-08-29 NOTE — H&P (Signed)
[name] is an 77 y.o. female.   Chief Complaint: Im here for my knee HPI: 77 yo female had right TKA did well initially and then during rehab felt a pop in the knee developed wound drainage and was felt to have possible infection, after exploration of the knee was found to have a patella tendon rupture. The incision was not deemed appropriate for reconstruction of the tendon so she was kept in an extension brace until the wound proved clean. She now has a clean healed incision and can proceed with reconstruction of the patella tendon. The major complaint is weakness in the right knee with inability to extend the knee.   Past Medical History  Diagnosis Date  . Hyperlipidemia   . Hypothyroidism   . Anxiety   . Arthritis   . Chronic bronchitis   . Blood transfusion   . Constipation   . Thyroid disease   . Depression     Past Surgical History  Procedure Date  . Tubal ligation   . Joint replacement 05/2010    left total knee Dr. Romeo Apple  . Total thyroidectomy March 2011    Dr. Suszanne Conners @ [facility]  . Left hip replacement @ [facility] '98   . Left total knee   . Total knee arthroplasty 05/03/2011    Procedure: TOTAL KNEE ARTHROPLASTY;  Surgeon: Fuller Canada, MD;  Location: [facility];  Service: Orthopedics;  Laterality: Right;  With DePuy  . Total hip arthroplasty   . Replacement total knee bilateral   . Patellar tendon repair 06/04/2011    Procedure: PATELLA TENDON REPAIR;  Surgeon: Fuller Canada, MD;  Location: [facility];  Service: Orthopedics;  Laterality: Right;  . Hematoma evacuation 06/04/2011    Procedure: EVACUATION HEMATOMA;  Surgeon: Fuller Canada, MD;  Location: [facility];  Service: Orthopedics;  Laterality: Right;  evacuation of seroma  . Eye surgery 2009    bilateral    Family History  Problem Relation Age of Onset  . Anesthesia problems Neg Hx   . Hypotension Neg Hx   . Malignant hyperthermia Neg Hx   . Pseudochol deficiency Neg Hx    Social History:  reports that she has  never smoked. She has never used smokeless tobacco. She reports that she does not drink alcohol or use illicit drugs.  Allergies: No Known Allergies  No current facility-administered medications on file as of .   Medications Prior to Admission  Medication Sig Dispense Refill  . ALPRAZolam (XANAX) 0.25 MG tablet Take 0.25 mg by mouth at bedtime as needed. For anxiety      . ciprofloxacin (CIPRO) 500 MG tablet Take 1 tablet (500 mg total) by mouth 2 (two) times daily.  56 tablet  1  . clotrimazole (LOTRIMIN) 1 % cream Apply topically 2 (two) times daily between meals as needed. For rash       . furosemide (LASIX) 40 MG tablet Take 40 mg by mouth daily.        Marland Kitchen levothyroxine (SYNTHROID, LEVOTHROID) 150 MCG tablet Take 150 mcg by mouth every morning.        Marland Kitchen PARoxetine (PAXIL) 20 MG tablet Take 20 mg by mouth every evening.          No results found for this or any previous visit (from the past 48 hour(s)). No results found.  Review of Systems  Constitutional: Negative for fever, chills and weight loss.  Eyes: Negative for blurred vision.  Respiratory: Negative for cough.   Cardiovascular: Negative  for chest pain.  Gastrointestinal: Negative for heartburn.  Genitourinary: Negative for dysuria.  Musculoskeletal: Positive for falls.  Skin: Negative.   Neurological: Negative for dizziness and headaches.  Endo/Heme/Allergies: Does not bruise/bleed easily.  Psychiatric/Behavioral: Negative for depression.    There were no vitals taken for this visit. Physical Exam  Constitutional: She is oriented to person, place, and time. She appears well-developed and well-nourished. No distress.  HENT:  Head: Normocephalic and atraumatic.  Eyes: Pupils are equal, round, and reactive to light.  Neck: Normal range of motion. Neck supple. No JVD present. No tracheal deviation present. No thyromegaly present.  Cardiovascular: Normal rate and intact distal pulses.   Respiratory: Effort normal and  breath sounds normal. No respiratory distress. She has no wheezes.  GI: Soft. She exhibits no distension. There is no rebound.  Musculoskeletal:       Right shoulder: Normal.       Left shoulder: Normal.       Right elbow: Normal.      Left elbow: Normal.       Right hip: She exhibits normal range of motion, normal strength, no tenderness and no deformity.       Left hip: She exhibits normal range of motion, normal strength, no tenderness, no swelling and no deformity.       Right knee: She exhibits decreased range of motion. She exhibits normal alignment, no LCL laxity and normal patellar mobility. tenderness found. Patellar tendon tenderness noted. No medial joint line and no lateral joint line tenderness noted.       Left knee: She exhibits decreased range of motion. She exhibits no swelling, no effusion, no ecchymosis, no deformity, no laceration, no erythema, normal alignment, no LCL laxity and no MCL laxity. no tenderness found.       Right ankle: She exhibits normal range of motion, no deformity and normal pulse.       Left ankle: She exhibits normal range of motion, no deformity and normal pulse.       Legs: Lymphadenopathy:    She has no cervical adenopathy.  Neurological: She is alert and oriented to person, place, and time. She has normal reflexes.  Skin: Skin is warm and dry. She is not diaphoretic.  Psychiatric: She has a normal mood and affect. Her behavior is normal. Judgment and thought content normal.     Assessment/Plan Right patella tendon rupture s/p right TKA  Allograft reconstruction of right patella tendon   Success rate ~ 90 % but with extensor lag   Fuller Canada 08/29/2011, 8:23 PM

## 2011-08-30 ENCOUNTER — Inpatient Hospital Stay (HOSPITAL_COMMUNITY): Payer: Medicare Other | Admitting: Anesthesiology

## 2011-08-30 ENCOUNTER — Encounter (HOSPITAL_COMMUNITY): Payer: Self-pay | Admitting: *Deleted

## 2011-08-30 ENCOUNTER — Inpatient Hospital Stay (HOSPITAL_COMMUNITY)
Admission: RE | Admit: 2011-08-30 | Discharge: 2011-09-02 | DRG: 502 | Disposition: A | Discharge status: Home Health | Payer: Medicare Other | Source: Ambulatory Visit | Attending: Orthopedic Surgery | Admitting: Orthopedic Surgery

## 2011-08-30 ENCOUNTER — Encounter (HOSPITAL_COMMUNITY): Payer: Self-pay | Admitting: Anesthesiology

## 2011-08-30 ENCOUNTER — Encounter (HOSPITAL_COMMUNITY)
Admission: RE | Disposition: A | Discharge status: Home Health | Payer: Self-pay | Source: Ambulatory Visit | Attending: Orthopedic Surgery

## 2011-08-30 DIAGNOSIS — E785 Hyperlipidemia, unspecified: Secondary | ICD-10-CM | POA: Diagnosis present

## 2011-08-30 DIAGNOSIS — Z96659 Presence of unspecified artificial knee joint: Secondary | ICD-10-CM

## 2011-08-30 DIAGNOSIS — M66269 Spontaneous rupture of extensor tendons, unspecified lower leg: Principal | ICD-10-CM | POA: Diagnosis present

## 2011-08-30 DIAGNOSIS — E039 Hypothyroidism, unspecified: Secondary | ICD-10-CM | POA: Diagnosis present

## 2011-08-30 DIAGNOSIS — F341 Dysthymic disorder: Secondary | ICD-10-CM | POA: Diagnosis present

## 2011-08-30 DIAGNOSIS — S86819A Strain of other muscle(s) and tendon(s) at lower leg level, unspecified leg, initial encounter: Secondary | ICD-10-CM

## 2011-08-30 HISTORY — PX: PATELLAR TENDON REPAIR: SHX737

## 2011-08-30 SURGERY — REPAIR, TENDON, PATELLAR
Anesthesia: Spinal | Site: Knee | Laterality: Right | Wound class: Clean

## 2011-08-30 MED ORDER — MIDAZOLAM HCL 2 MG/2ML IJ SOLN
INTRAMUSCULAR | Status: AC
  Filled 2011-08-30: qty 2

## 2011-08-30 MED ORDER — CEFAZOLIN SODIUM-DEXTROSE 2-3 GM-% IV SOLR
2.0000 g | Freq: Four times a day (QID) | INTRAVENOUS | Status: AC
  Administered 2011-08-30 – 2011-08-31 (×3): 2 g via INTRAVENOUS
  Filled 2011-08-30 (×3): qty 50

## 2011-08-30 MED ORDER — OXYCODONE HCL 5 MG PO TABS
5.0000 mg | ORAL_TABLET | ORAL | Status: DC | PRN
  Filled 2011-08-30: qty 2

## 2011-08-30 MED ORDER — PROPOFOL 10 MG/ML IV EMUL
INTRAVENOUS | Status: AC
  Filled 2011-08-30: qty 20

## 2011-08-30 MED ORDER — MIDAZOLAM HCL 5 MG/5ML IJ SOLN
INTRAMUSCULAR | Status: DC | PRN
  Administered 2011-08-30 (×2): 1 mg via INTRAVENOUS

## 2011-08-30 MED ORDER — BUPIVACAINE-EPINEPHRINE PF 0.5-1:200000 % IJ SOLN
INTRAMUSCULAR | Status: AC
  Filled 2011-08-30: qty 20

## 2011-08-30 MED ORDER — ACETAMINOPHEN 325 MG PO TABS
650.0000 mg | ORAL_TABLET | Freq: Four times a day (QID) | ORAL | Status: DC | PRN
  Administered 2011-08-31: 650 mg via ORAL
  Filled 2011-08-30: qty 2

## 2011-08-30 MED ORDER — ONDANSETRON HCL 4 MG/2ML IJ SOLN: 4.0000 mg | Freq: Once | INTRAMUSCULAR | Status: DC | PRN

## 2011-08-30 MED ORDER — SODIUM CHLORIDE 0.9 % IR SOLN
Status: DC | PRN
  Administered 2011-08-30: 1000 mL

## 2011-08-30 MED ORDER — ONDANSETRON HCL 4 MG PO TABS
4.0000 mg | ORAL_TABLET | Freq: Four times a day (QID) | ORAL | Status: DC | PRN
  Administered 2011-08-31: 4 mg via ORAL
  Filled 2011-08-30: qty 1

## 2011-08-30 MED ORDER — FENTANYL CITRATE 0.05 MG/ML IJ SOLN
INTRAMUSCULAR | Status: DC | PRN
  Administered 2011-08-30 (×2): 12.5 ug via INTRATHECAL

## 2011-08-30 MED ORDER — METOCLOPRAMIDE HCL 10 MG PO TABS: 5.0000 mg | ORAL_TABLET | Freq: Three times a day (TID) | ORAL | Status: DC | PRN

## 2011-08-30 MED ORDER — CEFAZOLIN SODIUM 1-5 GM-% IV SOLN
INTRAVENOUS | Status: AC
  Filled 2011-08-30: qty 100

## 2011-08-30 MED ORDER — SORBITOL 70 % SOLN
30.0000 mL | Freq: Every day | Status: DC | PRN
  Filled 2011-08-30: qty 30

## 2011-08-30 MED ORDER — HYDROCODONE-ACETAMINOPHEN 10-325 MG PO TABS
1.0000 | ORAL_TABLET | ORAL | Status: DC | PRN
  Administered 2011-08-30 – 2011-09-02 (×11): 2 via ORAL
  Filled 2011-08-30 (×11): qty 2

## 2011-08-30 MED ORDER — ACETAMINOPHEN 650 MG RE SUPP: 650.0000 mg | Freq: Four times a day (QID) | RECTAL | Status: DC | PRN

## 2011-08-30 MED ORDER — BUPIVACAINE-EPINEPHRINE 0.5% -1:200000 IJ SOLN
INTRAMUSCULAR | Status: DC | PRN
  Administered 2011-08-30: 60 mL

## 2011-08-30 MED ORDER — FENTANYL CITRATE 0.05 MG/ML IJ SOLN
INTRAMUSCULAR | Status: AC
  Filled 2011-08-30: qty 2

## 2011-08-30 MED ORDER — DIPHENHYDRAMINE HCL 12.5 MG/5ML PO ELIX: 12.5000 mg | ORAL_SOLUTION | ORAL | Status: DC | PRN

## 2011-08-30 MED ORDER — CEFAZOLIN SODIUM-DEXTROSE 2-3 GM-% IV SOLR
2.0000 g | INTRAVENOUS | Status: AC
  Administered 2011-08-30 (×2): 1 g via INTRAVENOUS

## 2011-08-30 MED ORDER — METHOCARBAMOL 100 MG/ML IJ SOLN
500.0000 mg | Freq: Four times a day (QID) | INTRAVENOUS | Status: DC | PRN
  Filled 2011-08-30: qty 5

## 2011-08-30 MED ORDER — LACTATED RINGERS IV SOLN
INTRAVENOUS | Status: DC
  Administered 2011-08-30: 1000 mL via INTRAVENOUS

## 2011-08-30 MED ORDER — SENNA 8.6 MG PO TABS
1.0000 | ORAL_TABLET | Freq: Two times a day (BID) | ORAL | Status: DC
  Administered 2011-08-30 – 2011-09-02 (×7): 8.6 mg via ORAL
  Filled 2011-08-30 (×7): qty 1

## 2011-08-30 MED ORDER — BUPIVACAINE IN DEXTROSE 0.75-8.25 % IT SOLN
INTRATHECAL | Status: DC | PRN
  Administered 2011-08-30: 15 mg via INTRATHECAL
  Administered 2011-08-30: 11.25 mg via INTRATHECAL

## 2011-08-30 MED ORDER — BUPIVACAINE IN DEXTROSE 0.75-8.25 % IT SOLN
INTRATHECAL | Status: AC
  Filled 2011-08-30: qty 2

## 2011-08-30 MED ORDER — ALPRAZOLAM 0.25 MG PO TABS
0.2500 mg | ORAL_TABLET | Freq: Every evening | ORAL | Status: DC | PRN
  Administered 2011-08-30: 0.25 mg via ORAL
  Filled 2011-08-30: qty 1

## 2011-08-30 MED ORDER — MENTHOL 3 MG MT LOZG: 1.0000 | LOZENGE | OROMUCOSAL | Status: DC | PRN

## 2011-08-30 MED ORDER — PROPOFOL 10 MG/ML IV EMUL
INTRAVENOUS | Status: DC | PRN
  Administered 2011-08-30: 12:00:00 via INTRAVENOUS
  Administered 2011-08-30: 100 ug/kg/min via INTRAVENOUS
  Administered 2011-08-30: 10:00:00 via INTRAVENOUS

## 2011-08-30 MED ORDER — HYDROMORPHONE HCL PF 1 MG/ML IJ SOLN
0.5000 mg | INTRAMUSCULAR | Status: DC | PRN
  Administered 2011-08-30 – 2011-08-31 (×6): 1 mg via INTRAVENOUS
  Filled 2011-08-30 (×6): qty 1

## 2011-08-30 MED ORDER — MIDAZOLAM HCL 2 MG/2ML IJ SOLN
INTRAMUSCULAR | Status: AC
  Administered 2011-08-30: 2 mg via INTRAVENOUS
  Filled 2011-08-30: qty 2

## 2011-08-30 MED ORDER — METOCLOPRAMIDE HCL 5 MG/ML IJ SOLN: 5.0000 mg | Freq: Three times a day (TID) | INTRAMUSCULAR | Status: DC | PRN

## 2011-08-30 MED ORDER — DOCUSATE SODIUM 100 MG PO CAPS
100.0000 mg | ORAL_CAPSULE | Freq: Two times a day (BID) | ORAL | Status: DC
  Administered 2011-08-30 – 2011-09-02 (×7): 100 mg via ORAL
  Filled 2011-08-30 (×7): qty 1

## 2011-08-30 MED ORDER — LIDOCAINE HCL (PF) 1 % IJ SOLN
INTRAMUSCULAR | Status: AC
  Filled 2011-08-30: qty 5

## 2011-08-30 MED ORDER — SODIUM CHLORIDE 0.9 % IV SOLN
INTRAVENOUS | Status: DC
  Administered 2011-08-30 – 2011-09-01 (×4): via INTRAVENOUS
  Administered 2011-09-01 – 2011-09-02 (×2): 20 mL via INTRAVENOUS

## 2011-08-30 MED ORDER — ALUM & MAG HYDROXIDE-SIMETH 200-200-20 MG/5ML PO SUSP: 30.0000 mL | ORAL | Status: DC | PRN

## 2011-08-30 MED ORDER — METHOCARBAMOL 500 MG PO TABS: 500.0000 mg | ORAL_TABLET | Freq: Four times a day (QID) | ORAL | Status: DC | PRN

## 2011-08-30 MED ORDER — FENTANYL CITRATE 0.05 MG/ML IJ SOLN
25.0000 ug | INTRAMUSCULAR | Status: DC | PRN
  Administered 2011-08-30: 50 ug via INTRAVENOUS

## 2011-08-30 MED ORDER — MAGNESIUM HYDROXIDE 400 MG/5ML PO SUSP: 30.0000 mL | Freq: Every day | ORAL | Status: DC | PRN

## 2011-08-30 MED ORDER — LEVOTHYROXINE SODIUM 75 MCG PO TABS
150.0000 ug | ORAL_TABLET | ORAL | Status: DC
  Administered 2011-08-31 – 2011-09-02 (×3): 150 ug via ORAL
  Filled 2011-08-30 (×3): qty 2

## 2011-08-30 MED ORDER — PHENOL 1.4 % MT LIQD: 1.0000 | OROMUCOSAL | Status: DC | PRN

## 2011-08-30 MED ORDER — MIDAZOLAM HCL 2 MG/2ML IJ SOLN
1.0000 mg | INTRAMUSCULAR | Status: DC | PRN
  Administered 2011-08-30 (×2): 2 mg via INTRAVENOUS

## 2011-08-30 MED ORDER — FENTANYL CITRATE 0.05 MG/ML IJ SOLN
INTRAMUSCULAR | Status: DC | PRN
  Administered 2011-08-30 (×3): 12.5 ug via INTRAVENOUS
  Administered 2011-08-30: 25 ug via INTRAVENOUS
  Administered 2011-08-30: 12.5 ug via INTRAVENOUS

## 2011-08-30 MED ORDER — MAGNESIUM CITRATE PO SOLN: 1.0000 | Freq: Once | ORAL | Status: AC | PRN

## 2011-08-30 MED ORDER — CLOTRIMAZOLE 1 % EX CREA
TOPICAL_CREAM | Freq: Two times a day (BID) | CUTANEOUS | Status: DC | PRN
Start: 2011-08-30 — End: 2011-09-02
  Filled 2011-08-30: qty 15

## 2011-08-30 MED ORDER — EPINEPHRINE HCL 0.1 MG/ML IJ SOLN
INTRAMUSCULAR | Status: DC | PRN
  Administered 2011-08-30: .1 ug via INTRATRACHEAL

## 2011-08-30 MED ORDER — PAROXETINE HCL 20 MG PO TABS
20.0000 mg | ORAL_TABLET | Freq: Every evening | ORAL | Status: DC
  Administered 2011-08-30 – 2011-09-01 (×3): 20 mg via ORAL
  Filled 2011-08-30 (×3): qty 1

## 2011-08-30 MED ORDER — FUROSEMIDE 40 MG PO TABS
40.0000 mg | ORAL_TABLET | Freq: Every day | ORAL | Status: DC
  Administered 2011-08-30 – 2011-09-02 (×4): 40 mg via ORAL
  Filled 2011-08-30 (×4): qty 1

## 2011-08-30 MED ORDER — FENTANYL CITRATE 0.05 MG/ML IJ SOLN
INTRAMUSCULAR | Status: AC
  Administered 2011-08-30: 50 ug via INTRAVENOUS
  Filled 2011-08-30: qty 2

## 2011-08-30 MED ORDER — ONDANSETRON HCL 4 MG/2ML IJ SOLN
4.0000 mg | Freq: Four times a day (QID) | INTRAMUSCULAR | Status: DC | PRN
  Administered 2011-08-30: 4 mg via INTRAVENOUS
  Filled 2011-08-30: qty 2

## 2011-08-30 SURGICAL SUPPLY — 75 items
ANCH SUT PUSHLCK 24X4.5 STRL (Orthopedic Implant) ×2 IMPLANT
BAG HAMPER (MISCELLANEOUS) ×2 IMPLANT
BANDAGE ELASTIC 4 VELCRO NS (GAUZE/BANDAGES/DRESSINGS) IMPLANT
BANDAGE ELASTIC 6 VELCRO NS (GAUZE/BANDAGES/DRESSINGS) IMPLANT
BANDAGE ESMARK 4X12 BL STRL LF (DISPOSABLE) ×1 IMPLANT
BIT DRILL 3.2X128 (BIT) IMPLANT
BLADE SURG SZ10 CARB STEEL (BLADE) ×2 IMPLANT
BNDG CMPR 12X4 ELC STRL LF (DISPOSABLE) ×1
BNDG COHESIVE 4X5 TAN NS LF (GAUZE/BANDAGES/DRESSINGS) IMPLANT
BNDG ESMARK 4X12 BLUE STRL LF (DISPOSABLE) ×2
BRACE T-SCOPE KNEE POSTOP (MISCELLANEOUS) ×1 IMPLANT
CHLORAPREP W/TINT 26ML (MISCELLANEOUS) ×2 IMPLANT
CLOTH BEACON ORANGE TIMEOUT ST (SAFETY) ×2 IMPLANT
COOLER CRYO IC GRAV AND TUBE (ORTHOPEDIC SUPPLIES) ×1 IMPLANT
COVER LIGHT HANDLE STERIS (MISCELLANEOUS) ×4 IMPLANT
CUFF CRYO KNEE LG 20X31 COOLER (ORTHOPEDIC SUPPLIES) IMPLANT
CUFF CRYO KNEE18X23 MED (MISCELLANEOUS) ×1 IMPLANT
CUFF TOURNIQUET SINGLE 34IN LL (TOURNIQUET CUFF) IMPLANT
CUFF TOURNIQUET SINGLE 44IN (TOURNIQUET CUFF) ×1 IMPLANT
DECANTER SPIKE VIAL GLASS SM (MISCELLANEOUS) ×2 IMPLANT
DRAPE INCISE IOBAN 44X35 STRL (DRAPES) ×1 IMPLANT
DRAPE INCISE IOBAN 66X45 STRL (DRAPES) ×1 IMPLANT
DRESSING ALLEVYN BORDER 5X5 (GAUZE/BANDAGES/DRESSINGS) ×1 IMPLANT
DRSG MEPILEX BORDER 4X12 (GAUZE/BANDAGES/DRESSINGS) ×1 IMPLANT
DURAPREP 26ML APPLICATOR (WOUND CARE) ×1 IMPLANT
ELECT REM PT RETURN 9FT ADLT (ELECTROSURGICAL) ×2
ELECTRODE REM PT RTRN 9FT ADLT (ELECTROSURGICAL) ×1 IMPLANT
GAUZE XEROFORM 5X9 LF (GAUZE/BANDAGES/DRESSINGS) IMPLANT
GLOVE ECLIPSE 6.5 STRL STRAW (GLOVE) ×2 IMPLANT
GLOVE EXAM NITRILE MD LF STRL (GLOVE) ×1 IMPLANT
GLOVE INDICATOR 7.0 STRL GRN (GLOVE) ×2 IMPLANT
GLOVE SKINSENSE NS SZ8.0 LF (GLOVE) ×1
GLOVE SKINSENSE STRL SZ8.0 LF (GLOVE) ×1 IMPLANT
GLOVE SS N UNI LF 8.5 STRL (GLOVE) ×2 IMPLANT
GOWN STRL REIN XL XLG (GOWN DISPOSABLE) ×6 IMPLANT
GRAFT TISS 230-320 GRACILIS (Bone Implant) IMPLANT
GRAFT TISS SEMITEND 4-8 (Bone Implant) IMPLANT
IMMOBILIZER KNEE 19 UNV (ORTHOPEDIC SUPPLIES) IMPLANT
INST SET MINOR BONE (KITS) ×2 IMPLANT
K-WIRE 229MX1.6 (WIRE) IMPLANT
KIT ROOM TURNOVER APOR (KITS) ×2 IMPLANT
MANIFOLD NEPTUNE II (INSTRUMENTS) ×2 IMPLANT
NDL HYPO 21X1.5 SAFETY (NEEDLE) ×1 IMPLANT
NDL MAYO 6 CRC TAPER PT (NEEDLE) IMPLANT
NEEDLE HYPO 21X1.5 SAFETY (NEEDLE) ×2 IMPLANT
NEEDLE MAYO 6 CRC TAPER PT (NEEDLE) ×2 IMPLANT
NS IRRIG 1000ML POUR BTL (IV SOLUTION) ×3 IMPLANT
PACK BASIC III (CUSTOM PROCEDURE TRAY) ×2
PACK BASIC LIMB (CUSTOM PROCEDURE TRAY) ×2 IMPLANT
PACK SRG BSC III STRL LF ECLPS (CUSTOM PROCEDURE TRAY) IMPLANT
PAD ABD 5X9 TENDERSORB (GAUZE/BANDAGES/DRESSINGS) IMPLANT
PAD ARMBOARD 7.5X6 YLW CONV (MISCELLANEOUS) ×2 IMPLANT
PADDING CAST COTTON 6X4 STRL (CAST SUPPLIES) ×1 IMPLANT
PASSER SUT SWANSON 36MM LOOP (INSTRUMENTS) ×1 IMPLANT
PUSHLOCK PEEK 4.5X24 (Orthopedic Implant) ×2 IMPLANT
SCREW 8X23MM BIOABSORBABLE (Screw) ×1 IMPLANT
SET BASIN LINEN APH (SET/KITS/TRAYS/PACK) ×2 IMPLANT
SPLINT IMMOBILIZER J 3INX20FT (CAST SUPPLIES)
SPLINT J IMMOBILIZER 3X20FT (CAST SUPPLIES) ×1 IMPLANT
SPONGE GAUZE 4X4 12PLY (GAUZE/BANDAGES/DRESSINGS) ×2 IMPLANT
SPONGE LAP 18X18 X RAY DECT (DISPOSABLE) ×3 IMPLANT
STAPLER VISISTAT 35W (STAPLE) ×2 IMPLANT
SUT ETHIBOND 5 LR DA (SUTURE) ×2 IMPLANT
SUT ETHIBOND NAB OS 4 #2 30IN (SUTURE) ×7 IMPLANT
SUT MON AB 0 CT1 (SUTURE) ×7 IMPLANT
SUT MON AB 2-0 SH 27 (SUTURE)
SUT MON AB 2-0 SH27 (SUTURE) IMPLANT
SUT NUROLON CT 2 BLK #1 18IN (SUTURE) IMPLANT
SUT VIC AB 1 CT1 27 (SUTURE)
SUT VIC AB 1 CT1 27XBRD ANTBC (SUTURE) IMPLANT
SYR BULB IRRIGATION 50ML (SYRINGE) ×2 IMPLANT
SYR CONTROL 10ML LL (SYRINGE) ×2 IMPLANT
TENDON GRACILIS FROZEN (Bone Implant) ×2 IMPLANT
TENDON GRACILIS FROZEN 230-320 (Bone Implant) ×1 IMPLANT
TENDON SEMI-TENDINOSUS (Bone Implant) ×2 IMPLANT

## 2011-08-30 NOTE — Anesthesia Postprocedure Evaluation (Signed)
Anesthesia Post Note  Patient: [name]  Procedure(s) Performed:  PATELLA TENDON REPAIR - Allograft Reconstrucation Right Patella Tendon  Anesthesia type: Spinal  Patient location: PACU  Post pain: Pain level controlled  Post assessment: Post-op Vital signs reviewed, Patient's Cardiovascular Status Stable, Respiratory Function Stable, Patent Airway, No signs of Nausea or vomiting and Pain level controlled  Last Vitals:  Filed Vitals:   08/30/11 1230  BP: 138/65  Pulse: 75  Temp: 36.4 C  Resp: 16    Post vital signs: Reviewed and stable  Level of consciousness: awake and alert   Complications: No apparent anesthesia complications

## 2011-08-30 NOTE — Op Note (Signed)
08/30/2011  12:26 PM  PATIENT:  [name]  77 y.o. female  PRE-OPERATIVE DIAGNOSIS:  right patella tendon rupture, s/p tka   POST-OPERATIVE DIAGNOSIS:  right patella tendon rupture, s/p tka   PROCEDURE:  Procedure(s):ALLOGRAFT RECONSTRUCTION [SEMITENDONOSIS AND GRACILIS] PATELLA TENDON AND PRIMARY REPAIR   FINDINGS: The patellar tendon was ruptured from the tibial tubercle with a large defect at the end of the tendon.  SURGEON:  Surgeon(s): Fuller Canada, MD  PHYSICIAN ASSISTANT:   ASSISTANTS: BETTY ASHLEY    ANESTHESIA:   spinal  EBL:  Total I/O In: 550 [I.V.:550] Out: 50 [Blood:50]  BLOOD ADMINISTERED:none  DRAINS: none   LOCAL MEDICATIONS USED:  MARCAINE WITH EPI 30 CC  SPECIMEN:  No Specimen  DISPOSITION OF SPECIMEN:  N/A  COUNTS:  Correct   TOURNIQUET:   Total Tourniquet Time Documented: Thigh (Right) - 120 minutes  DICTATION: .Dragon Dictation Details of procedure. The patient was identified in the preop area and the right knee was marked as a surgical site, the patient was reexamined and the chart update was completed along with the consent.  The patient was taken to the operating room and given 2 g of Ancef secondary to her weight of over 80 kg. Her for spinal anesthetic did not take completely anesthetic anesthetic was administered. This was successful. In the supine position the patient was prepped and draped in sterile technique.  The timeout procedure was executed  The limb was exsanguinated with a 4 to Esmarch and the tourniquet was elevated to 300 mm of mercury. The previous skin incision was used to open the knee down to the extensor mechanism. Fluid was noted in the subcutaneous area coming from the joint with a large inverted U-shaped defect in the patellar tendon. The tendon had ruptured from the tibial tubercle. Previous suture was removed. The joint was irrigated with copious amounts of saline.  2 #5 Tycron sutures were passed  through the tendon in a modified whipstitch technique. 2 push lock anchor size 4.5 mm were then used to anchor these sutures to the tibial tubercle. The U-shaped defect was then filled with a semitendinosus graft which was passed through an 8 mm drill hole distal to the tubercle. The graft was crisscrossed helpful the defect. The 2 ends of the graft were then sutured to the remaining portions of the medial and lateral retinaculum of the patella. A GRACILIS graft was then used to help fill the defect area and reinforce it. The wound was irrigated with copious amounts of saline. A 8 x 22.5 mm BioScrew was placed in the tibia to anchor the graft.  Closure was performed with 0 Monocryl suture.  Staples were used to reapproximate the skin and the patient was placed in a locked brace in full extension.  She will be allowed full weightbearing but no range of motion exercises for the first 6 weeks.  PLAN OF CARE: Admit to inpatient   PATIENT DISPOSITION:  PACU - hemodynamically stable.   Delay start of Pharmacological VTE agent (>24hrs) due to surgical blood loss or risk of bleeding:  YES

## 2011-08-30 NOTE — Interval H&P Note (Signed)
History and Physical Interval Note:  08/30/2011 9:07 AM  [name]  has presented today for surgery, with the diagnosis of right patella tendon rupture  The various methods of treatment have been discussed with the patient and family. After consideration of risks, benefits and other options for treatment, the patient has consented to  Procedure(s):RIGHT PATELLA TENDON REPAIR/RECONSTRUCTION as a surgical intervention .  The patients' history has been reviewed, patient RE-examined, no change in status, stable for surgery.  I have reviewed the patients' chart and labs.  Questions were answered to the patient's satisfaction.     Fuller Canada

## 2011-08-30 NOTE — Brief Op Note (Signed)
08/30/2011  12:26 PM  PATIENT:  [name]  77 y.o. female  PRE-OPERATIVE DIAGNOSIS:  right patella tendon rupture, s/p tka   POST-OPERATIVE DIAGNOSIS:  right patella tendon rupture, s/p tka   PROCEDURE:  Procedure(s):ALLOGRAFT RECONSTRUCTION [SEMITENDONOSIS AND GRACILIS] PATELLA TENDON AND PRIMARY REPAIR  SURGEON:  Surgeon(s): Fuller Canada, MD  PHYSICIAN ASSISTANT:   ASSISTANTS: BETTY ASHLEY    ANESTHESIA:   spinal  EBL:  Total I/O In: 550 [I.V.:550] Out: 50 [Blood:50]  BLOOD ADMINISTERED:none  DRAINS: none   LOCAL MEDICATIONS USED:  MARCAINE WITH EPI 30 CC  SPECIMEN:  No Specimen  DISPOSITION OF SPECIMEN:  N/A  COUNTS:  YES  TOURNIQUET:   Total Tourniquet Time Documented: Thigh (Right) - 120 minutes  DICTATION: .Dragon Dictation  PLAN OF CARE: Admit to inpatient   PATIENT DISPOSITION:  PACU - hemodynamically stable.   Delay start of Pharmacological VTE agent (>24hrs) due to surgical blood loss or risk of bleeding:  YES

## 2011-08-30 NOTE — Anesthesia Procedure Notes (Addendum)
Date/Time: 08/30/2011 9:15 AM Performed by: Minerva Areola Pre-anesthesia Checklist: Patient being monitored, Suction available, Emergency Drugs available, Patient identified and Timeout performed Oxygen Delivery Method: Nasal Cannula    Spinal  Patient location during procedure: OR Start time: 08/30/2011 9:23 AM End time: 08/30/2011 9:28 AM Staffing CRNA/Resident: Minerva Areola Preanesthetic Checklist Completed: patient identified, site marked, surgical consent, pre-op evaluation, timeout performed, IV checked, risks and benefits discussed and monitors and equipment checked Spinal Block Patient position: right lateral decubitus Prep: Betadine and prep x 3 Patient monitoring: heart rate, cardiac monitor, continuous pulse ox and blood pressure Approach: right paramedian Location: L3-4 (1% lidocaine 1cc) Injection technique: single-shot Needle Needle type: Spinocan  Needle gauge: 22 G Needle length: 9 cm Assessment Sensory level: T12 (level at 0941) Events: slow clear csf  pre and post injectioin Additional Notes Skin localization 1% Lidocaine Marcaine 15 mg/ Fentanyl 12.5 mcg/ Epi 0.1 Tray Lot [id number] Tray expiration: 2013-09  Spinal  Start time: 08/30/2011 9:47 AM End time: 08/30/2011 9:55 AM Staffing CRNA/Resident: Minerva Areola Preanesthetic Checklist Completed: patient identified, site marked, surgical consent, pre-op evaluation, timeout performed, IV checked, risks and benefits discussed and monitors and equipment checked Spinal Block Patient position: right lateral decubitus Prep: Betadine and prep x 3 Patient monitoring: heart rate, cardiac monitor, continuous pulse ox and blood pressure Approach: right paramedian Location: L3-4 Injection technique: single-shot Needle Needle type: Spinocan  Needle gauge: 22 G Needle length: 9 cm Assessment Sensory level: T8 (level at 1001) Events: clear freeflowing csf pre and post injection Additional  Notes  Marcaine 11.25 mg/ Fentanyl 12.5 mcg/  Tray Lot [id number] Tray expiration: 2013-09  Date/Time: 08/30/2011 10:24 AM Performed by: Minerva Areola Oxygen Delivery Method: Simple face mask

## 2011-08-30 NOTE — Anesthesia Preprocedure Evaluation (Addendum)
Anesthesia Evaluation  Patient identified by MRN, date of birth, ID band Patient awake    Reviewed: Allergy & Precautions, H&P , NPO status , Patient's Chart, lab work & pertinent test results  History of Anesthesia Complications Negative for: history of anesthetic complications  Airway Mallampati: I TM Distance: >3 FB Neck ROM: Full    Dental No notable dental hx.    Pulmonary neg pulmonary ROS,    Pulmonary exam normal       Cardiovascular neg cardio ROS Regular Normal    Neuro/Psych PSYCHIATRIC DISORDERS Anxiety Depression Negative Neurological ROS     GI/Hepatic negative GI ROS, Neg liver ROS,   Endo/Other  Hypothyroidism   Renal/GU negative Renal ROS     Musculoskeletal  (+) Arthritis -, Osteoarthritis,    Abdominal Normal abdominal exam  (+)   Peds  Hematology  (+) Blood dyscrasia, anemia ,   Anesthesia Other Findings   Reproductive/Obstetrics                           Anesthesia Physical Anesthesia Plan  ASA: III  Anesthesia Plan: Spinal   Post-op Pain Management:    Induction:   Airway Management Planned: Nasal Cannula  Additional Equipment:   Intra-op Plan:   Post-operative Plan:   Informed Consent: I have reviewed the patients History and Physical, chart, labs and discussed the procedure including the risks, benefits and alternatives for the proposed anesthesia with the patient or authorized representative who has indicated his/her understanding and acceptance.     Plan Discussed with:   Anesthesia Plan Comments:         Anesthesia Quick Evaluation

## 2011-08-30 NOTE — Transfer of Care (Signed)
Immediate Anesthesia Transfer of Care Note  Patient: [name]  Procedure(s) Performed:  PATELLA TENDON REPAIR - Allograft Reconstrucation Right Patella Tendon  Patient Location: PACU  Anesthesia Type: SAB  Level of Consciousness: awake  Airway & Oxygen Therapy: Patient Spontanous Breathing and non-rebreather face mask  Post-op Assessment: Report given to PACU RN, Post -op Vital signs reviewed and stable. SAB Level  T 12  Post vital signs: Reviewed and stable  Complications: No apparent anesthesia complications

## 2011-08-31 DIAGNOSIS — IMO0002 Reserved for concepts with insufficient information to code with codable children: Secondary | ICD-10-CM

## 2011-08-31 LAB — BASIC METABOLIC PANEL
BUN: 13 mg/dL (ref 6–23)
Chloride: 101 mEq/L (ref 96–112)
Creatinine, Ser: 0.72 mg/dL (ref 0.50–1.10)
GFR calc Af Amer: >90 mL/min (ref >90)
Glucose, Bld: 121 mg/dL — ABNORMAL HIGH (ref 70–99)

## 2011-08-31 LAB — CBC
HCT: 37.8 % (ref 36.0–46.0)
MCV: 86.5 fL (ref 78.0–100.0)
RDW: 14.4 % (ref 11.5–15.5)
WBC: 9.7 10*3/uL (ref 4.0–10.5)

## 2011-08-31 MED ORDER — ASPIRIN 325 MG PO TABS
325.0000 mg | ORAL_TABLET | Freq: Two times a day (BID) | ORAL | Status: DC
  Administered 2011-08-31 – 2011-09-02 (×5): 325 mg via ORAL
  Filled 2011-08-31 (×5): qty 1

## 2011-08-31 NOTE — Progress Notes (Signed)
CARE MANAGEMENT NOTE 08/31/2011  Patient:  [name], [name]   Account Number:  [id number]  Date Initiated:  08/31/2011  Documentation initiated by:  Rosemary Holms  Subjective/Objective Assessment:   Pt admitted with right patelia rupture. Surgery 08/30/11     Action/Plan:   PT evaluation indicated recommendation to skilled.   Anticipated DC Date:  09/03/2011   Anticipated DC Plan:  SKILLED NURSING FACILITY      DC Planning Services  CM consult      Choice offered to / List presented to:             Status of service:  In process, will continue to follow Medicare Important Message given?   (If response is "NO", the following Medicare IM given date fields will be blank) Date Medicare IM given:   Date Additional Medicare IM given:    Discharge Disposition:    Per UR Regulation:    Comments:  08/31/11 1400 Laterrica Libman RN BSN CM CM to follow if [facility] needs are identified.

## 2011-08-31 NOTE — Progress Notes (Signed)
Subjective: 1 Day Post-Op Procedure(s) (LRB): PATELLA TENDON REPAIR (Right) Patient reports pain as mild.    Objective: Vital signs in last 24 hours: Temp:  [97.5 F (36.4 C)-99.1 F (37.3 C)] 99.1 F (37.3 C) (12/04 0612) Pulse Rate:  [60-80] 78  (12/04 0612) Resp:  [10-37] 20  (12/04 0612) BP: (109-176)/(51-90) 129/70 mmHg (12/04 0612) SpO2:  [90 %-97 %] 92 % (12/04 0612) Weight:  [112.038 kg (247 lb)-119.2 kg (262 lb 12.6 oz)] 262 lb 12.6 oz (119.2 kg) (12/04 0500)  Intake/Output from previous day: 12/03 0701 - 12/04 0700 In: 770 [P.O.:120; I.V.:650] Out: 450 [Urine:400; Blood:50] Intake/Output this shift:     Basename 08/31/11 0525  HGB 12.2    Basename 08/31/11 0525  WBC 9.7  RBC 4.37  HCT 37.8  PLT 233    Basename 08/31/11 0525  NA 137  K 3.5  CL 101  CO2 31  BUN 13  CREATININE 0.72  GLUCOSE 121*  CALCIUM 8.5   No results found for this basename: LABPT:2,INR:2 in the last 72 hours  Neurologically intact Neurovascular intact Sensation intact distally Intact pulses distally Dorsiflexion/Plantar flexion intact  Assessment/Plan: 1 Day Post-Op Procedure(s) (LRB): PATELLA TENDON REPAIR (Right) Up with therapy  Fuller Canada 08/31/2011, 8:05 AM

## 2011-08-31 NOTE — Progress Notes (Signed)
Physical Therapy Treatment Patient Details Name: [name] MRN: [id number] DOB: [birth year] Today's Date: 08/31/2011  TIME: 1215-1234/3mins Gt-106mins TA  PT Assessment/Plan  PT - Assessment/Plan Comments on Treatment Session: Pt did well;able to lift self up in recliner for nursing to assist with bed pan;Mod A needed for ambulation distance of 2' due to patient severe trunk flexion and unsteadiness PT Frequency: Min 5X/week Follow Up Recommendations: Skilled nursing facility Equipment Recommended: Defer to next venue PT Goals  Acute Rehab PT Goals PT Goal Formulation: With patient Time For Goal Achievement: 2 weeks Pt will go Supine/Side to Sit: with max assist Pt will go Sit to Supine/Side: with mod assist Pt will go Sit to Stand: with mod assist Pt will go Stand to Sit: with min assist PT Goal: Stand to Sit - Progress: Progressing toward goal Pt will Ambulate: 1 - 15 feet;with mod assist PT Goal: Ambulate - Progress: Progressing toward goal  PT Treatment Precautions/Restrictions  Precautions Precaution Comments: no flexion of R knee Required Braces or Orthoses: Yes Knee Immobilizer: On at all times Restrictions Weight Bearing Restrictions: No Mobility (including Balance) Bed Mobility Bed Mobility: Yes Supine to Sit: 2: Max assist;HOB elevated (Comment degrees) (60 deg) Sitting - Scoot to Edge of Bed: 6: Modified independent (Device/Increase time) Sit to Supine - Left: 3: Mod assist Sit to Supine - Left Details (indicate cue type and reason): assistance needed with LEs Scooting to [facility]: 6: Modified independent (Device/Increase time) (self assisted with trapeze) Transfers Transfers: Yes Sit to Stand: 4: Min assist Sit to Stand Details (indicate cue type and reason): has difficulty standing with assist of L LE only Stand to Sit: 3: Mod assist Stand to Sit Details: verbal and tactile assistance needed with backing up to surface and controlling  descent Ambulation/Gait Ambulation/Gait: Yes Ambulation/Gait Assistance: 3: Mod assist Ambulation/Gait Assistance Details (indicate cue type and reason): pt 's trunk flexed over walker, partly due to hip flexion contractures...needs assist to maintain stance, but is able to bear some weight on RLE ( about 30%) Ambulation Distance (Feet): 2 Feet Assistive device: Rolling walker Gait Pattern: Trunk flexed;Decreased stance time - right;Decreased step length - left;Decreased hip/knee flexion - left;Decreased hip/knee flexion - right;Decreased weight shift to right Gait velocity: extremely slow and labored Stairs: No Wheelchair Mobility Wheelchair Mobility: No  Posture/Postural Control Posture/Postural Control: Postural limitations Postural Limitations: hip flexion contracutres, multiple spinal problems with chronic back pain Balance Balance Assessed: No Exercise  General Exercises - Lower Extremity Ankle Circles/Pumps: AROM;20 reps;Supine;Both End of Session PT - End of Session Equipment Utilized During Treatment: Gait belt Activity Tolerance: Patient tolerated treatment well Patient left: in bed;with call bell in reach;with bed alarm set Nurse Communication: Mobility status for transfers (nursing aide present for transfer) General Behavior During Session: [facility] for tasks performed Cognition: [facility] for tasks performed  Kellie Murrill ATKINSO 08/31/2011, 12:53 PM

## 2011-08-31 NOTE — Anesthesia Postprocedure Evaluation (Signed)
  Anesthesia Post-op Note  Patient: [name]  Procedure(s) Performed:  PATELLA TENDON REPAIR - Allograft Reconstrucation Right Patella Tendon  Patient Location: room 325  Anesthesia Type: Spinal  Level of Consciousness: awake, alert , oriented and patient cooperative  Airway and Oxygen Therapy: Patient Spontanous Breathing  Post-op Pain: 3 /10, mild  Post-op Assessment: Post-op Vital signs reviewed, Patient's Cardiovascular Status Stable, Respiratory Function Stable, Patent Airway, No signs of Nausea or vomiting and Adequate PO intake  Post-op Vital Signs: Reviewed and stable  Complications: No apparent anesthesia complications

## 2011-08-31 NOTE — Addendum Note (Signed)
Addendum  created 08/31/11 0829 by Despina Hidden   Modules edited:Notes Section

## 2011-08-31 NOTE — Progress Notes (Signed)
Utilization review completed.  

## 2011-08-31 NOTE — Progress Notes (Signed)
CSW presented bed offers and pt chooses [facility]. Facility notified.  Awaiting stability for d/c to SNF.   [name]  

## 2011-08-31 NOTE — Progress Notes (Signed)
Physical Therapy Evaluation Patient Details Name: [name] MRN: [id number] DOB: 15-Nov-1932 Today's Date: 08/31/2011  Problem List:  Patient Active Problem List  Diagnoses  . KNEE, ARTHRITIS, DEGEN./OSTEO  . S/P total knee replacement  . Patellar tendon rupture    Past Medical History:  Past Medical History  Diagnosis Date  . Hyperlipidemia   . Hypothyroidism   . Anxiety   . Arthritis   . Chronic bronchitis   . Blood transfusion   . Constipation   . Thyroid disease   . Depression    Past Surgical History:  Past Surgical History  Procedure Date  . Tubal ligation   . Joint replacement 05/2010    left total knee Dr. Romeo Apple  . Total thyroidectomy March 2011    Dr. Suszanne Conners @ [facility]  . Left hip replacement @ [facility] '98   . Left total knee   . Total knee arthroplasty 05/03/2011    Procedure: TOTAL KNEE ARTHROPLASTY;  Surgeon: Fuller Canada, MD;  Location: [facility];  Service: Orthopedics;  Laterality: Right;  With DePuy  . Total hip arthroplasty   . Replacement total knee bilateral   . Patellar tendon repair 06/04/2011    Procedure: PATELLA TENDON REPAIR;  Surgeon: Fuller Canada, MD;  Location: [facility];  Service: Orthopedics;  Laterality: Right;  . Hematoma evacuation 06/04/2011    Procedure: EVACUATION HEMATOMA;  Surgeon: Fuller Canada, MD;  Location: [facility];  Service: Orthopedics;  Laterality: Right;  evacuation of seroma  . Eye surgery 2009    bilateral    PT Assessment/Plan/Recommendation PT Assessment Clinical Impression Statement: obese, very pleasant pt who is well known to this service---is in a Bledsoe brace R, locked in full extension...her mobility is significantly limited in transfer from sit to stand because her R LE is unable to assist in the transfer...we both feel that she will definately need SNF at d/c...will plan to see BID PT Recommendation/Assessment: Patient will need skilled PT in the acute care venue PT Problem List: Decreased strength;Decreased  range of motion;Decreased activity tolerance;Decreased mobility;Decreased safety awareness;Decreased knowledge of precautions;Obesity Barriers to Discharge: Decreased caregiver support PT Therapy Diagnosis : Difficulty walking;Abnormality of gait;Generalized weakness PT Plan PT Frequency: Min 5X/week PT Treatment/Interventions: Gait training;Functional mobility training;Therapeutic activities;Therapeutic exercise;Patient/family education PT Recommendation Recommendations for Other Services: OT consult Follow Up Recommendations: Skilled nursing facility Equipment Recommended: Defer to next venue PT Goals  Acute Rehab PT Goals PT Goal Formulation: With patient Time For Goal Achievement: 2 weeks Pt will go Supine/Side to Sit: with max assist Pt will go Sit to Supine/Side: with mod assist Pt will go Sit to Stand: with mod assist Pt will go Stand to Sit: with min assist Pt will Ambulate: 1 - 15 feet;with mod assist  PT Evaluation Precautions/Restrictions  Precautions Precaution Comments: no flexion of R knee Required Braces or Orthoses: Yes Knee Immobilizer: On at all times Restrictions Weight Bearing Restrictions: No Prior Functioning  Home Living Lives With: Alone Receives Help From: Family Type of Home: House Home Layout: One level Home Access: Ramped entrance Bathroom Shower/Tub: Nurse, adult Accessibility: Yes How Accessible: Accessible via walker Home Adaptive Equipment: Bedside commode/3-in-1;Tub transfer bench;Walker - rolling;Walker - standard;Walker - four wheeled;Wheelchair - manual Additional Comments: pt sleeps in lift chair, but does need to be able to rise from a w/c Prior Function Level of Independence: Independent with basic ADLs;Independent with gait;Independent with homemaking with ambulation;Independent with transfers;Requires assistive device for independence Vocation: Retired Financial risk analyst Arousal/Alertness: Awake/alert Overall  Cognitive Status:  Appears within functional limits for tasks assessed Orientation Level: Oriented X4 Sensation/Coordination Sensation Light Touch: Appears Intact Hot/Cold: Not tested Proprioception: Not tested Extremity Assessment RLE Assessment RLE Assessment: Not tested LLE Assessment LLE Assessment: Within Functional Limits (generally weakened in all muscle groups-   3/5) Mobility (including Balance) Bed Mobility Bed Mobility: Yes Supine to Sit: 2: Max assist;HOB elevated (Comment degrees) (60 deg) Sitting - Scoot to Edge of Bed: 6: Modified independent (Device/Increase time) Transfers Transfers: Yes Sit to Stand: 3: Mod assist Sit to Stand Details (indicate cue type and reason): has difficulty standing with assist of L LE only Stand to Sit: 4: Min assist Stand to Sit Details: verbal cues to extend RLE in front of her prior to sitting Ambulation/Gait Ambulation/Gait: Yes Ambulation/Gait Assistance: 2: Max assist;3: Mod assist Ambulation/Gait Assistance Details (indicate cue type and reason): pt 's trunk flexed over walker, partly due to hip flexion contractures...needs assist to maintain stance, but is able to bear some weight on RLE ( about 30%) Ambulation Distance (Feet): 2 Feet Assistive device: Rolling walker Gait Pattern: Trunk flexed;Decreased stance time - right;Decreased step length - left;Decreased hip/knee flexion - left;Decreased hip/knee flexion - right;Decreased weight shift to right Gait velocity: extremely slow and labored Stairs: No Wheelchair Mobility Wheelchair Mobility: No  Posture/Postural Control Posture/Postural Control: Postural limitations Postural Limitations: hip flexion contracutres, multiple spinal problems with chronic back pain Balance Balance Assessed: No Exercise  General Exercises - Lower Extremity Ankle Circles/Pumps: AROM;20 reps;Supine;Both End of Session PT - End of Session Equipment Utilized During Treatment: Gait belt Activity  Tolerance: Patient tolerated treatment well Patient left: in chair;with call bell in reach;with bed alarm set General Behavior During Session: [facility] for tasks performed Cognition: [facility] for tasks performed  Konrad Penta 08/31/2011, 10:51 AM

## 2011-09-01 LAB — CBC
HCT: 36.4 % (ref 36.0–46.0)
MCH: 27.3 pg (ref 26.0–34.0)
MCHC: 31.6 g/dL (ref 30.0–36.0)
MCV: 86.5 fL (ref 78.0–100.0)
RDW: 14.4 % (ref 11.5–15.5)

## 2011-09-01 NOTE — Progress Notes (Signed)
Physical Therapy Treatment Patient Details Name: FELICITA NUNCIO MRN: [id number] DOB: Jan 20, 1933 Today's Date: 09/01/2011  PT Assessment/Plan  PT - Assessment/Plan Comments on Treatment Session: significant improvement in mobility today...min pain in R knee and no pain with weight bearing...independent with dressing, transfers...able to ambulate a functional distance with walker, adequate stability...pt very strongly wants to go home at d/c and I feel that she should manage well...recommend HHPT initially PT Plan: Discharge plan needs to be updated Follow Up Recommendations: Home health PT Equipment Recommended: Defer to next venue PT Goals  Acute Rehab PT Goals PT Goal: Supine/Side to Sit - Progress: Met PT Goal: Sit to Stand - Progress: Met PT Goal: Stand to Sit - Progress: Met PT Goal: Ambulate - Progress: Met  PT Treatment Precautions/Restrictions  Precautions Precaution Comments: no flexion of R knee Required Braces or Orthoses: Yes Knee Immobilizer: On at all times Restrictions Weight Bearing Restrictions: Yes Mobility (including Balance) Bed Mobility Supine to Sit: 6: Modified independent (Device/Increase time) Sitting - Scoot to Edge of Bed: 6: Modified independent (Device/Increase time) Scooting to [facility]: 6: Modified independent (Device/Increase time) Transfers Sit to Stand: 6: Modified independent (Device/Increase time) Stand to Sit: 6: Modified independent (Device/Increase time) Ambulation/Gait Ambulation/Gait Assistance: 6: Modified independent (Device/Increase time) Ambulation Distance (Feet): 25 Feet Assistive device: Rolling walker Stairs: No Wheelchair Mobility Wheelchair Mobility: No    Exercise  General Exercises - Lower Extremity Ankle Circles/Pumps: AROM;20 reps;Both;Supine End of Session PT - End of Session Equipment Utilized During Treatment: Gait belt Activity Tolerance: Patient tolerated treatment well Patient left: in chair;with call bell in  reach;with bed alarm set Nurse Communication: Mobility status for transfers;Mobility status for ambulation General Behavior During Session: [facility] for tasks performed Cognition: [facility] for tasks performed  Konrad Penta 09/01/2011, 2:42 PM

## 2011-09-01 NOTE — Progress Notes (Signed)
Pt did well with PT today and is now planning to go home at d/c.  CSW will sign off.  CM aware for home health needs.  Karn Cassis

## 2011-09-01 NOTE — Progress Notes (Signed)
Subjective: 2 Days Post-Op Procedure(s) (LRB): PATELLA TENDON REPAIR (Right) Patient reports pain as mild.    Objective: Vital signs in last 24 hours: Temp:  [98.1 F (36.7 C)-99.3 F (37.4 C)] 98.4 F (36.9 C) (12/05 0624) Pulse Rate:  [61-75] 70  (12/05 0624) Resp:  [18] 18  (12/05 0624) BP: (111-133)/(68-79) 123/79 mmHg (12/05 0624) SpO2:  [90 %-91 %] 90 % (12/05 0624)  Intake/Output from previous day: 12/04 0701 - 12/05 0700 In: 2840 [P.O.:840; I.V.:2000] Out: 300 [Urine:300] Intake/Output this shift:     Basename 09/01/11 0447 08/31/11 0525  HGB 11.5* 12.2    Basename 09/01/11 0447 08/31/11 0525  WBC 8.3 9.7  RBC 4.21 4.37  HCT 36.4 37.8  PLT 208 233    Basename 08/31/11 0525  NA 137  K 3.5  CL 101  CO2 31  BUN 13  CREATININE 0.72  GLUCOSE 121*  CALCIUM 8.5   No results found for this basename: LABPT:2,INR:2 in the last 72 hours  Neurologically intact ABD soft Neurovascular intact Sensation intact distally Intact pulses distally Dorsiflexion/Plantar flexion intact Compartment soft  Assessment/Plan: 2 Days Post-Op Procedure(s) (LRB): PATELLA TENDON REPAIR (Right) Advance diet Up with therapy D/C IV fluids  Fuller Canada 09/01/2011, 7:01 AM

## 2011-09-02 ENCOUNTER — Other Ambulatory Visit: Payer: Self-pay | Admitting: *Deleted

## 2011-09-02 LAB — CBC
MCH: 27.8 pg (ref 26.0–34.0)
MCHC: 32.1 g/dL (ref 30.0–36.0)
Platelets: 203 10*3/uL (ref 150–400)
RBC: 3.81 MIL/uL — ABNORMAL LOW (ref 3.87–5.11)
RDW: 14.6 % (ref 11.5–15.5)

## 2011-09-02 MED ORDER — SODIUM CHLORIDE 0.9 % IJ SOLN
INTRAMUSCULAR | Status: AC
  Administered 2011-09-02: 05:00:00
  Filled 2011-09-02: qty 6

## 2011-09-02 MED ORDER — HYDROCODONE-ACETAMINOPHEN 10-325 MG PO TABS: 1.0000 | ORAL_TABLET | ORAL | Status: DC | PRN

## 2011-09-02 NOTE — Discharge Summary (Signed)
Physician Discharge Summary  Patient ID: [name] MRN: [id number] DOB/AGE: [birth year] 77 y.o.  Admit date: 08/30/2011 Discharge date: 09/02/2011  Admission Diagnoses: Patellar tendon rupture status post total knee right lower extremity  Discharge Diagnoses: Same Active Problems:  * No active hospital problems. *    Discharged Condition: good  Hospital Course: The patient was admitted on September 6 with a ruptured patellar tendon status post total knee replacement. She tolerated procedure well and had allograft reconstruction with direct repair of the patellar tendon using semitendinosus and gracilis allograft tissue. She tolerated that well ambulated with physical therapy weightbearing as tolerated in the brace. No complications during the hospital stay.   Consults: none  Significant Diagnostic Studies: None  Treatments: Physical therapy  Discharge Exam: Blood pressure 145/76, pulse 59, temperature 98 F (36.7 C), temperature source Oral, resp. rate 20, height 5\' 9"  (1.753 m), weight 119.2 kg (262 lb 12.6 oz), SpO2 93.00%. Unremarkable exam  Disposition: Home-Health Care Svc  Discharge Orders    Future Appointments: Provider: Department: Dept Phone: Center:   09/13/2011 3:15 PM Fuller Canada, MD [facility] [phone] [facility]     Future Orders Please Complete By Expires   Ambulatory referral to Home Health      Comments:   Please evaluate [name] for admission to [facility].  Disciplines requested: Physical Therapy and Home Health Aide  Services to provide: Evaluate and Other: gait training weight bearing as tolerated in brace locked in extension 3-5 x a week as allowed   Physician to follow patient's care (the person listed here will be responsible for signing ongoing orders): Referring Provider  Requested Start of Care Date: Tomorrow  Special Instructions:  Do not remove brace   Ambulatory referral to Physical Therapy      Comments:   3-5 x a week as allowed WBAT in brace locked in extension   Diet - low sodium heart healthy      Call MD / Call 911      Comments:   If you experience chest pain or shortness of breath, CALL 911 and be transported to the hospital emergency room.  If you develope a fever above 101 F, pus (white drainage) or increased drainage or redness at the wound, or calf pain, call your surgeon's office.   Constipation Prevention      Comments:   Drink plenty of fluids.  Prune juice may be helpful.  You may use a stool softener, such as Colace (over the counter) 100 mg twice a day.  Use MiraLax (over the counter) for constipation as needed.   Increase activity slowly as tolerated      Weight Bearing as taught in Physical Therapy      Comments:   Use a walker or crutches as instructed.   Discharge instructions      Comments:   Keep brace on   Keep dressing on     Current Discharge Medication List    CONTINUE these medications which have CHANGED   Details  HYDROcodone-acetaminophen (NORCO) 10-325 MG per tablet Take 1 tablet by mouth every 4 (four) hours as needed for pain. Qty: 90 tablet, Refills: 5      CONTINUE these medications which have NOT CHANGED   Details  ALPRAZolam (XANAX) 0.25 MG tablet Take 0.25 mg by mouth at bedtime as needed. For anxiety    clotrimazole (LOTRIMIN) 1 % cream Apply topically 2 (two) times daily between meals as needed. For rash  furosemide (LASIX) 40 MG tablet Take 40 mg by mouth daily.      levothyroxine (SYNTHROID, LEVOTHROID) 150 MCG tablet Take 150 mcg by mouth every morning.      PARoxetine (PAXIL) 20 MG tablet Take 20 mg by mouth every evening.        STOP taking these medications     ciprofloxacin (CIPRO) 500 MG tablet        Follow-up Information    Follow up with Fuller Canada, MD on 09/13/2011. (appt should be 17th )    Contact information:   [address], [address] [place]  [address] [phone]         To return on December 17 for wound check and general checkup. She is weightbearing as tolerated in the brace locked in extension. She is being discharged home. Signed: Fuller Canada 09/02/2011, 8:27 AM

## 2011-09-02 NOTE — Plan of Care (Signed)
Problem: Discharge Progression Outcomes Goal: Other Discharge Outcomes/Goals Pt read discharge instructions she verbalized understanding of all instructions dressing changed befor discharge incision is well approximated with staples There is swelling and redness in the area no drainage odor or pain discharged to home via [facility] transport

## 2011-09-02 NOTE — Progress Notes (Signed)
CARE MANAGEMENT NOTE 09/02/2011  Patient:  [name], [name]   Account Number:  [id number]  Date Initiated:  08/31/2011  Documentation initiated by:  Rosemary Holms  Subjective/Objective Assessment:   Pt admitted with right patelia rupture. Surgery 08/30/11     Action/Plan:   PT evaluation indicated recommendation to skilled.   Anticipated DC Date:  09/03/2011   Anticipated DC Plan:  HOME W HOME HEALTH SERVICES      DC Planning Services  CM consult      Choice offered to / List presented to:  C-1 Patient        HH arranged  HH-3 OT  HH-2 PT      HH agency  [facility]   Status of service:  Completed, signed off Medicare Important Message given?  YES (If response is "NO", the following Medicare IM given date fields will be blank) Date Medicare IM given:  09/02/2011 Date Additional Medicare IM given:    Discharge Disposition:  HOME W HOME HEALTH SERVICES  Per UR Regulation:    Comments:  09/02/11 08:55 Chade Pitner Leanord Hawking RN BSN CM Spoke with patient at bedside. Pt has used [facility] in the past and would like to choose this agency again.   08/31/11 1400 Josuel Koeppen RN BSN CM CM to follow if [facility] needs are identified.

## 2011-09-06 ENCOUNTER — Telehealth: Payer: Self-pay | Admitting: Orthopedic Surgery

## 2011-09-06 DIAGNOSIS — Z96659 Presence of unspecified artificial knee joint: Secondary | ICD-10-CM

## 2011-09-06 LAB — TYPE AND SCREEN
ABO/RH(D): O POS
Antibody Screen: NEGATIVE
Unit division: 0

## 2011-09-06 NOTE — Telephone Encounter (Signed)
rx sent

## 2011-09-06 NOTE — Telephone Encounter (Signed)
[name] wants an order for a right leg extension  for her wheelchair faxed to [facility].

## 2011-09-07 ENCOUNTER — Encounter (HOSPITAL_COMMUNITY): Payer: Self-pay | Admitting: Orthopedic Surgery

## 2011-09-13 ENCOUNTER — Encounter: Payer: Self-pay | Admitting: Orthopedic Surgery

## 2011-09-13 ENCOUNTER — Ambulatory Visit (INDEPENDENT_AMBULATORY_CARE_PROVIDER_SITE_OTHER): Payer: Medicare Other | Admitting: Orthopedic Surgery

## 2011-09-13 DIAGNOSIS — S838X9A Sprain of other specified parts of unspecified knee, initial encounter: Secondary | ICD-10-CM

## 2011-09-13 DIAGNOSIS — Z96659 Presence of unspecified artificial knee joint: Secondary | ICD-10-CM

## 2011-09-13 DIAGNOSIS — S86819A Strain of other muscle(s) and tendon(s) at lower leg level, unspecified leg, initial encounter: Secondary | ICD-10-CM

## 2011-09-13 NOTE — Progress Notes (Signed)
Patient ID: [name], female   DOB: Sep 18, 1933, 77 y.o.   MRN: [id number] Postop visit #1 status post allograft reconstruction of RIGHT patellar tendon with underlying total knee replacement patellar tendon rupture and failed primary repair  Suture line looks very good no signs of infection  Patient is kept in a brace locked in extension weightbearing as tolerated  Recheck in 4 weeks at that time she can start range of motion exercises

## 2011-09-13 NOTE — Patient Instructions (Signed)
Keep brace on  Keep knee straight

## 2011-10-12 ENCOUNTER — Ambulatory Visit (INDEPENDENT_AMBULATORY_CARE_PROVIDER_SITE_OTHER): Payer: Medicare Other | Admitting: Orthopedic Surgery

## 2011-10-12 ENCOUNTER — Encounter: Payer: Self-pay | Admitting: Orthopedic Surgery

## 2011-10-12 DIAGNOSIS — S86819A Strain of other muscle(s) and tendon(s) at lower leg level, unspecified leg, initial encounter: Secondary | ICD-10-CM

## 2011-10-12 DIAGNOSIS — Z96659 Presence of unspecified artificial knee joint: Secondary | ICD-10-CM

## 2011-10-12 DIAGNOSIS — S838X9A Sprain of other specified parts of unspecified knee, initial encounter: Secondary | ICD-10-CM

## 2011-10-12 NOTE — Progress Notes (Signed)
Patient ID: [name], female   DOB: 07-Jul-1933, 78 y.o.   MRN: [id number] A  Postop followup visit.  Reconstruction, patellar tendon ruptures after knee replacement with allograft.  Date of surgery December 3.  Patient's range of motion is zero-50.  She actually has some ability to perform straight leg raise.  Recommend brace 0-50 for another 6 weeks and start physical therapy. Continue weightbearing as tolerated

## 2011-10-12 NOTE — Patient Instructions (Signed)
0-50 brace

## 2011-11-24 ENCOUNTER — Ambulatory Visit (INDEPENDENT_AMBULATORY_CARE_PROVIDER_SITE_OTHER): Payer: Medicare Other | Admitting: Orthopedic Surgery

## 2011-11-24 ENCOUNTER — Encounter: Payer: Self-pay | Admitting: Orthopedic Surgery

## 2011-11-24 VITALS — BP 124/70 | Ht 69.0 in | Wt 262.0 lb

## 2011-11-24 DIAGNOSIS — Z9889 Other specified postprocedural states: Secondary | ICD-10-CM | POA: Insufficient documentation

## 2011-11-24 NOTE — Patient Instructions (Signed)
Start home rehab   Brace continue

## 2011-11-24 NOTE — Progress Notes (Signed)
Patient ID: [name], female   DOB: 1933/07/08, 78 y.o.   MRN: [id number] Chief Complaint  Patient presents with  . Follow-up    6 week recheck right knee, knee reconstruction 08/30/11     The patient had a  allograft reconstruction of the patellar tendon after rupture around a total knee prosthesis.  This is now week number12 He paient has a 45 extensor lag fairly expected.  Recommend physical therapy continue brace followup 6 weeks

## 2011-12-20 ENCOUNTER — Telehealth: Payer: Self-pay | Admitting: Orthopedic Surgery

## 2011-12-20 NOTE — Telephone Encounter (Signed)
Approved another four weeks

## 2011-12-20 NOTE — Telephone Encounter (Signed)
Received call from Azucena Freed, physical therapist from [facility], requesting orders to extend physical therapy another 4 weeks.  States patient is working hard although still has not  met goals.  Her next scheduled appointment is 01/05/12.  Extend just until appointment or for the 4 weeks as requesting?  Her direct phone# is [phone].

## 2011-12-21 NOTE — Telephone Encounter (Signed)
Done, per note from Nurse.

## 2012-01-05 ENCOUNTER — Encounter: Payer: Self-pay | Admitting: Orthopedic Surgery

## 2012-01-05 ENCOUNTER — Ambulatory Visit (INDEPENDENT_AMBULATORY_CARE_PROVIDER_SITE_OTHER): Payer: Medicare Other | Admitting: Orthopedic Surgery

## 2012-01-05 VITALS — BP 90/50 | Ht 69.0 in | Wt 262.0 lb

## 2012-01-05 DIAGNOSIS — S838X9A Sprain of other specified parts of unspecified knee, initial encounter: Secondary | ICD-10-CM

## 2012-01-05 DIAGNOSIS — S86819A Strain of other muscle(s) and tendon(s) at lower leg level, unspecified leg, initial encounter: Secondary | ICD-10-CM

## 2012-01-05 DIAGNOSIS — Z96659 Presence of unspecified artificial knee joint: Secondary | ICD-10-CM

## 2012-01-05 NOTE — Progress Notes (Signed)
Patient ID: [name], female   DOB: Dec 29, 1932, 78 y.o.   MRN: [id number] Chief Complaint  Patient presents with  . Follow-up    6 week recheck on right knee.     The patient had a reconstruction of a patella tendon rupture. After total knee surgery was done on December 3  She's been in therapy. She still has extensor lag. She has subluxation of the patella, although the tendon is palpable throughout the distance of the patella to the tibial tubercle. Patella is riding high suggesting that the repair has either stretched out or as very lax versus completely ruptured.  She is in an extension lock brace, which we will continue all see her back in 2 months

## 2012-01-05 NOTE — Patient Instructions (Addendum)
BRACE 0-90   LOCK IN EXTENSION FOR WB   RETURN IN 2 MONTHS

## 2012-01-13 ENCOUNTER — Telehealth: Payer: Self-pay | Admitting: Orthopedic Surgery

## 2012-01-13 NOTE — Telephone Encounter (Signed)
Advised ok to extend therapy

## 2012-01-13 NOTE — Telephone Encounter (Signed)
Nurse returned the call

## 2012-01-13 NOTE — Telephone Encounter (Signed)
Stacy/[facility] asked if she can get an order to extend [name] PT for an additional 3 weeks. Stacy's # [phone].

## 2012-01-20 IMAGING — CR DG KNEE COMPLETE 4+V*R*
4 series · 4 of 4 positions shown · non-contrast
Comparison: 05/03/2011

CLINICAL DATA: Pain right knee

RIGHT KNEE - COMPLETE 4+ VIEW

[view not recorded (1 of 4)]
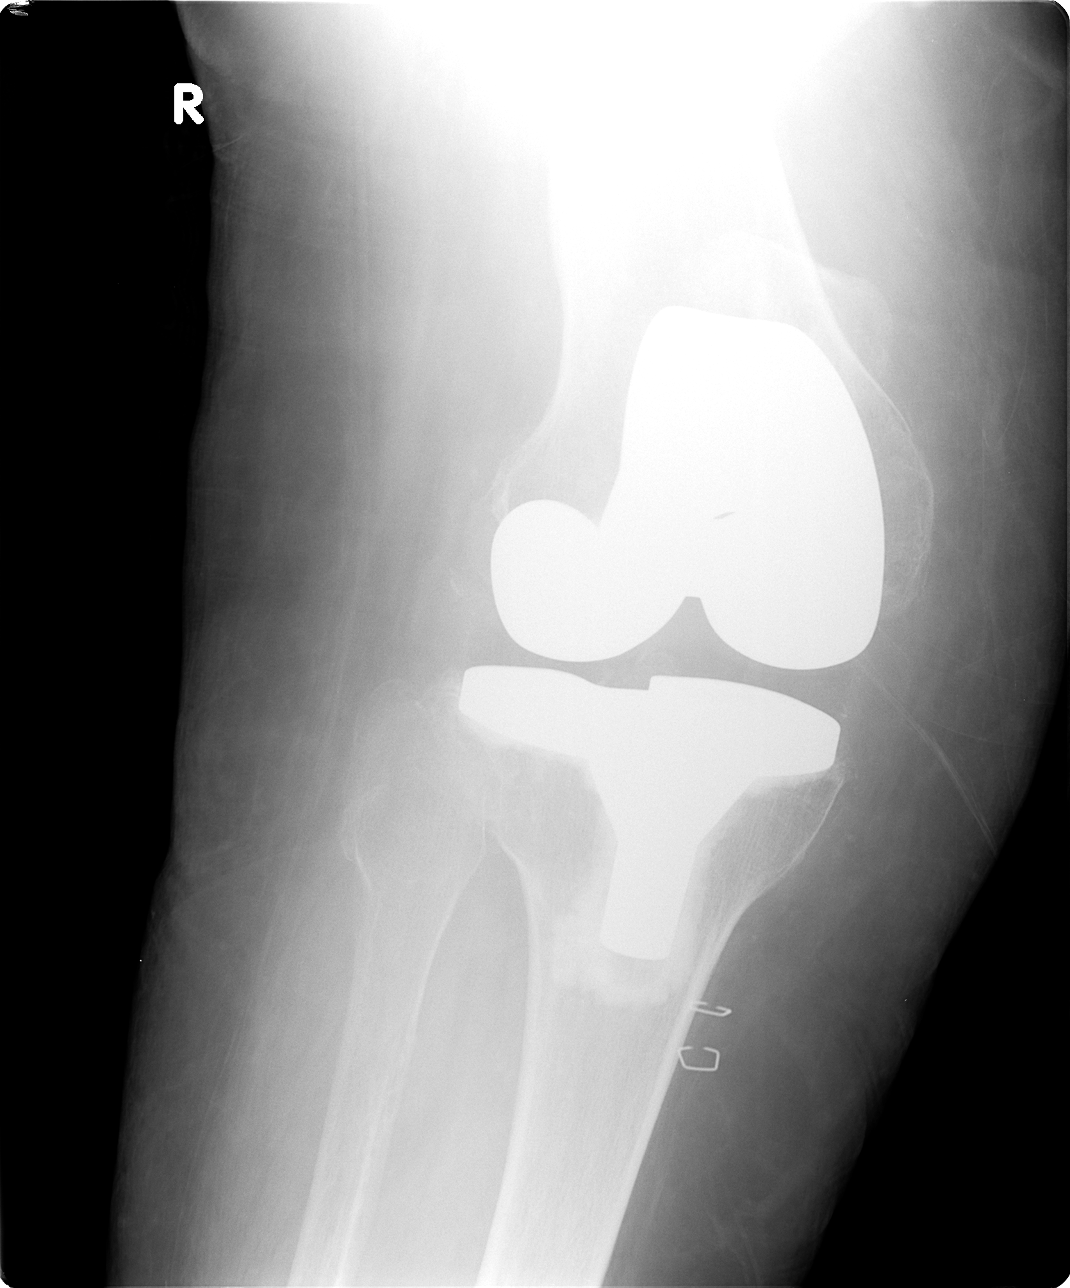

[view not recorded (2 of 4)]
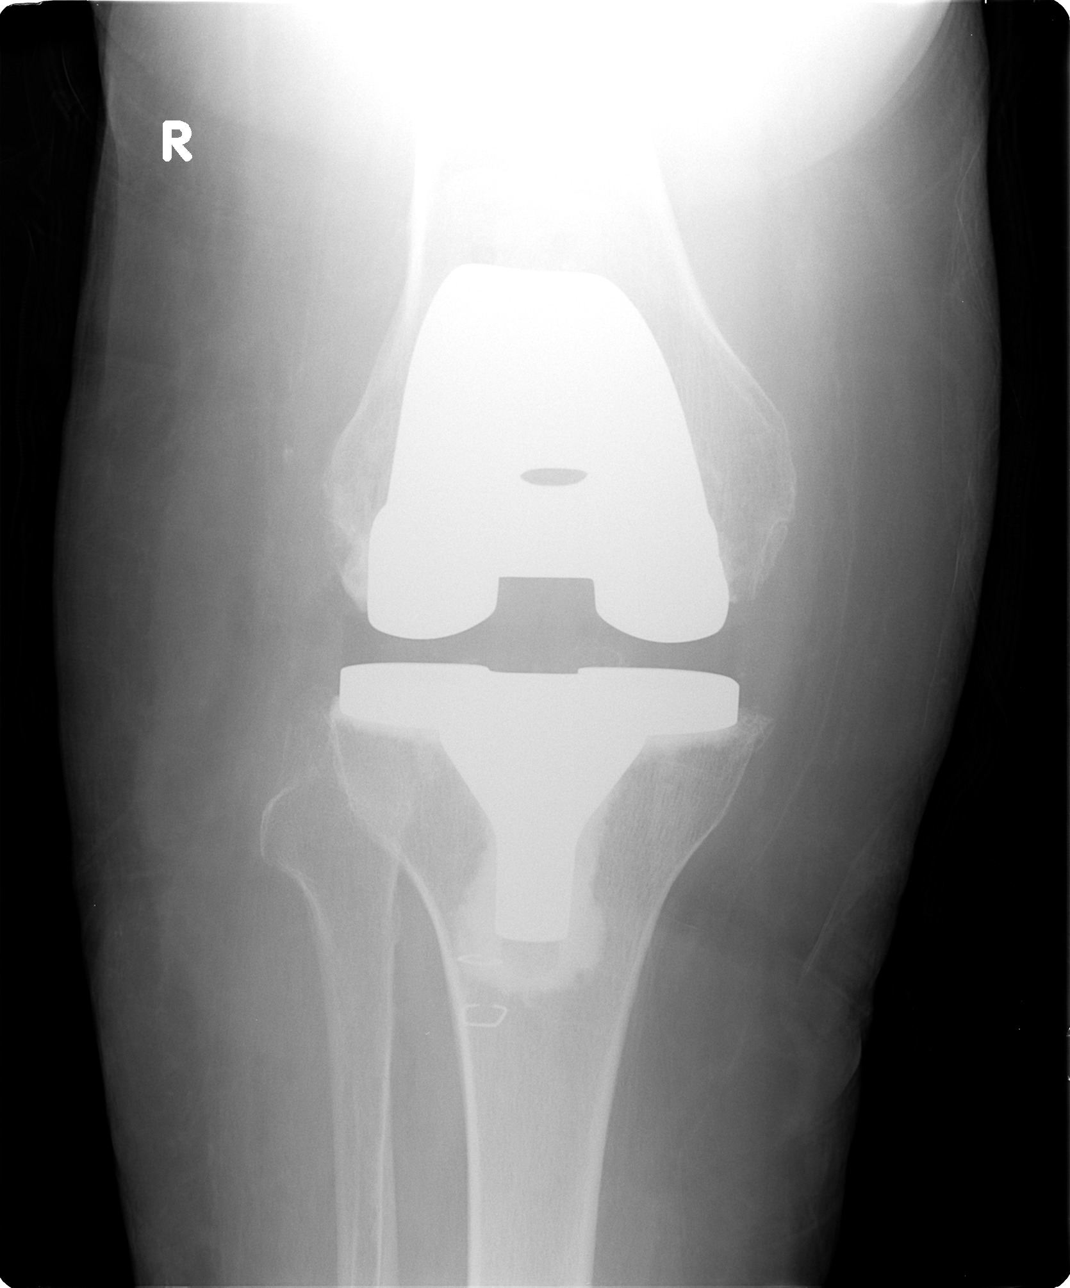

[view not recorded (3 of 4)]
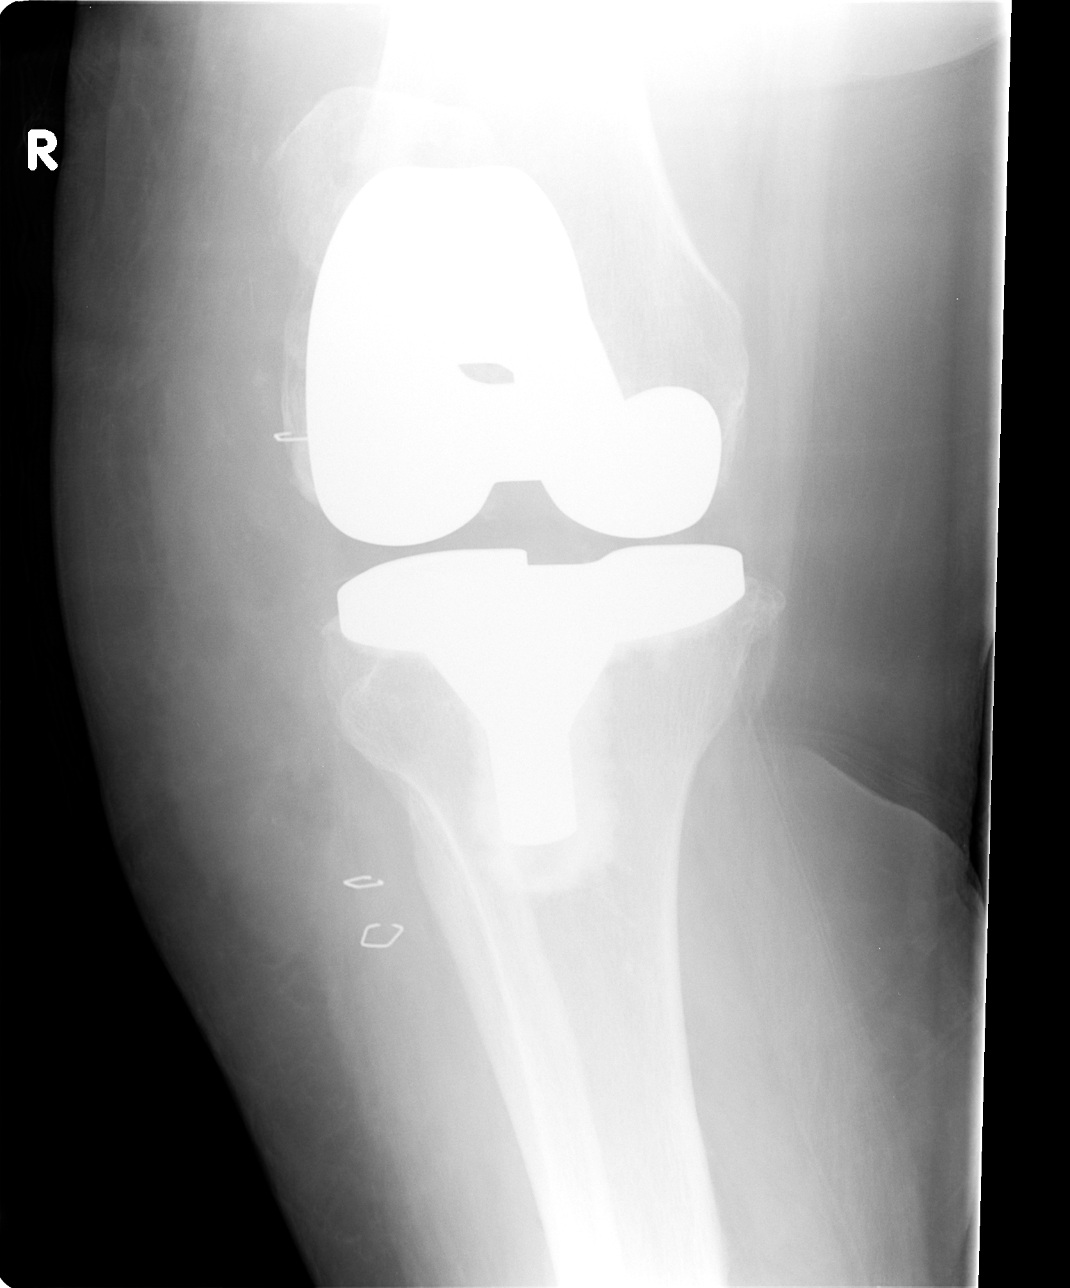

[view not recorded (4 of 4)]
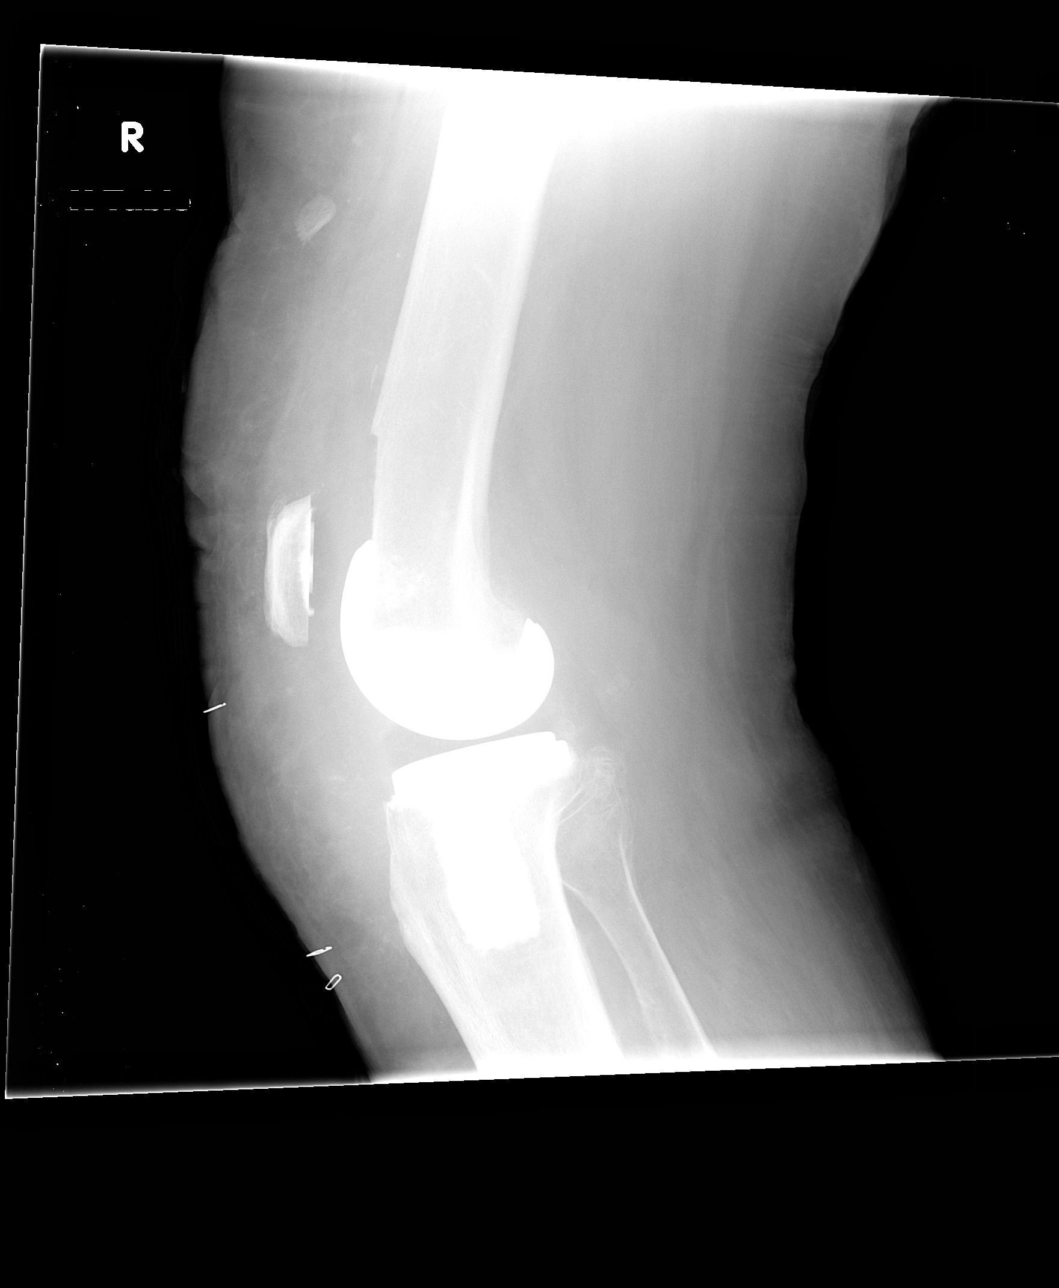

[4 of 4 positions shown; findings below may reference images not displayed]

FINDINGS: Components of right knee prosthesis in expected positions.
Diffuse bony demineralization.
Regional soft tissue swelling.
3 skin clips identified anteriorly.
No acute fracture, dislocation, or bone destruction.
Small calcific density or bone fragment is again identified at the
anterior aspect of the mid to distal thigh, unchanged.
No periprosthetic lucency.
IMPRESSION: Right knee prosthesis without acute complication identified.
Question small bone fragment in subcutaneous tissues of the mid to
distal right thigh anteriorly.
Regional soft tissue swelling increased since previous exam.

## 2012-03-08 ENCOUNTER — Encounter: Payer: Self-pay | Admitting: Orthopedic Surgery

## 2012-03-08 ENCOUNTER — Ambulatory Visit (INDEPENDENT_AMBULATORY_CARE_PROVIDER_SITE_OTHER): Payer: Medicare Other | Admitting: Orthopedic Surgery

## 2012-03-08 VITALS — BP 124/74 | Ht 69.0 in | Wt 262.0 lb

## 2012-03-08 DIAGNOSIS — Z96659 Presence of unspecified artificial knee joint: Secondary | ICD-10-CM

## 2012-03-08 MED ORDER — HYDROCODONE-ACETAMINOPHEN 10-325 MG PO TABS: 1.0000 | ORAL_TABLET | ORAL | Status: DC | PRN

## 2012-03-08 NOTE — Patient Instructions (Addendum)
Stop ted hose  Brace for walking

## 2012-03-08 NOTE — Progress Notes (Signed)
Patient ID: [name], female   DOB: [birth year], 78 y.o.   MRN: [id number] Chief Complaint  Patient presents with  . Follow-up    2 month recheck right knee tendon, DOS 08/30/11    BP 124/74  Ht 5\' 9"  (1.753 m)  Wt 262 lb (118.842 kg)  BMI 38.69 kg/m2  Status post right total knee replacement complicated by tendon rupture failed repair status post allograft reconstruction patella tendon  We have a partial take of the allograft reconstruction with a 60 extensor lag. The patient can extend the knee with gravity removed and partially against gravity. Review of systems is negative   She is mobile bed to chair and sit to stand in a brace she can ambulate in the brace  Followup 6 months

## 2012-03-18 IMAGING — CR DG KNEE COMPLETE 4+V*R*
4 series · 4 of 4 positions shown · non-contrast
Comparison: No prior radiographs of the right knee for comparison.

CLINICAL DATA: Complaint of right knee pain and swelling.  Open
surgical site with prior healing drainage.  Total knee replacement
May 13, 2011.

RIGHT KNEE - COMPLETE 4+ VIEW

[view not recorded (1 of 4)]
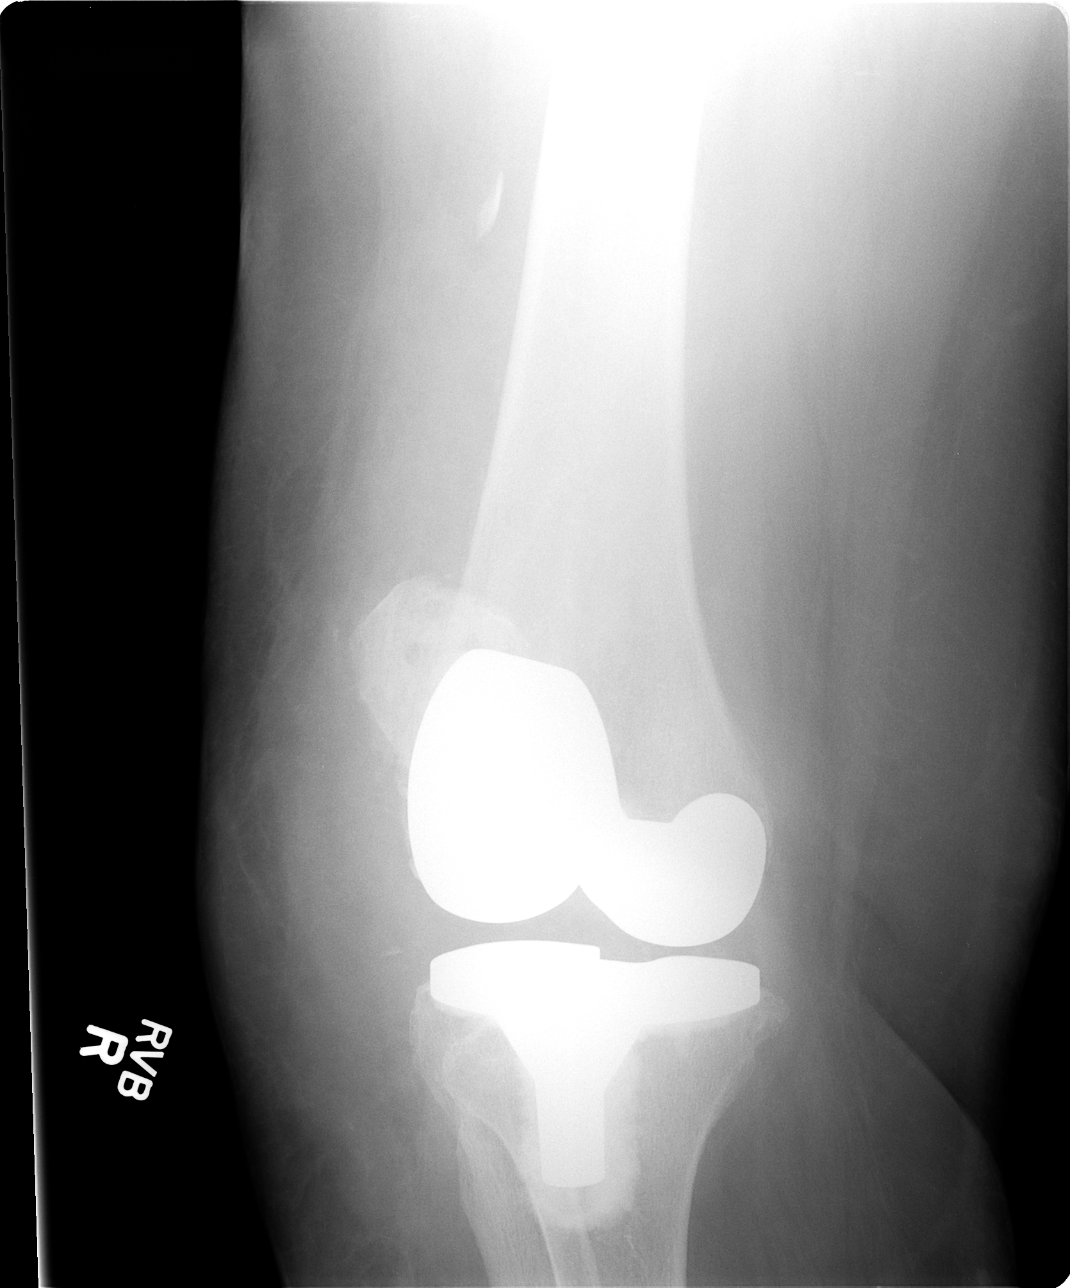

[view not recorded (2 of 4)]
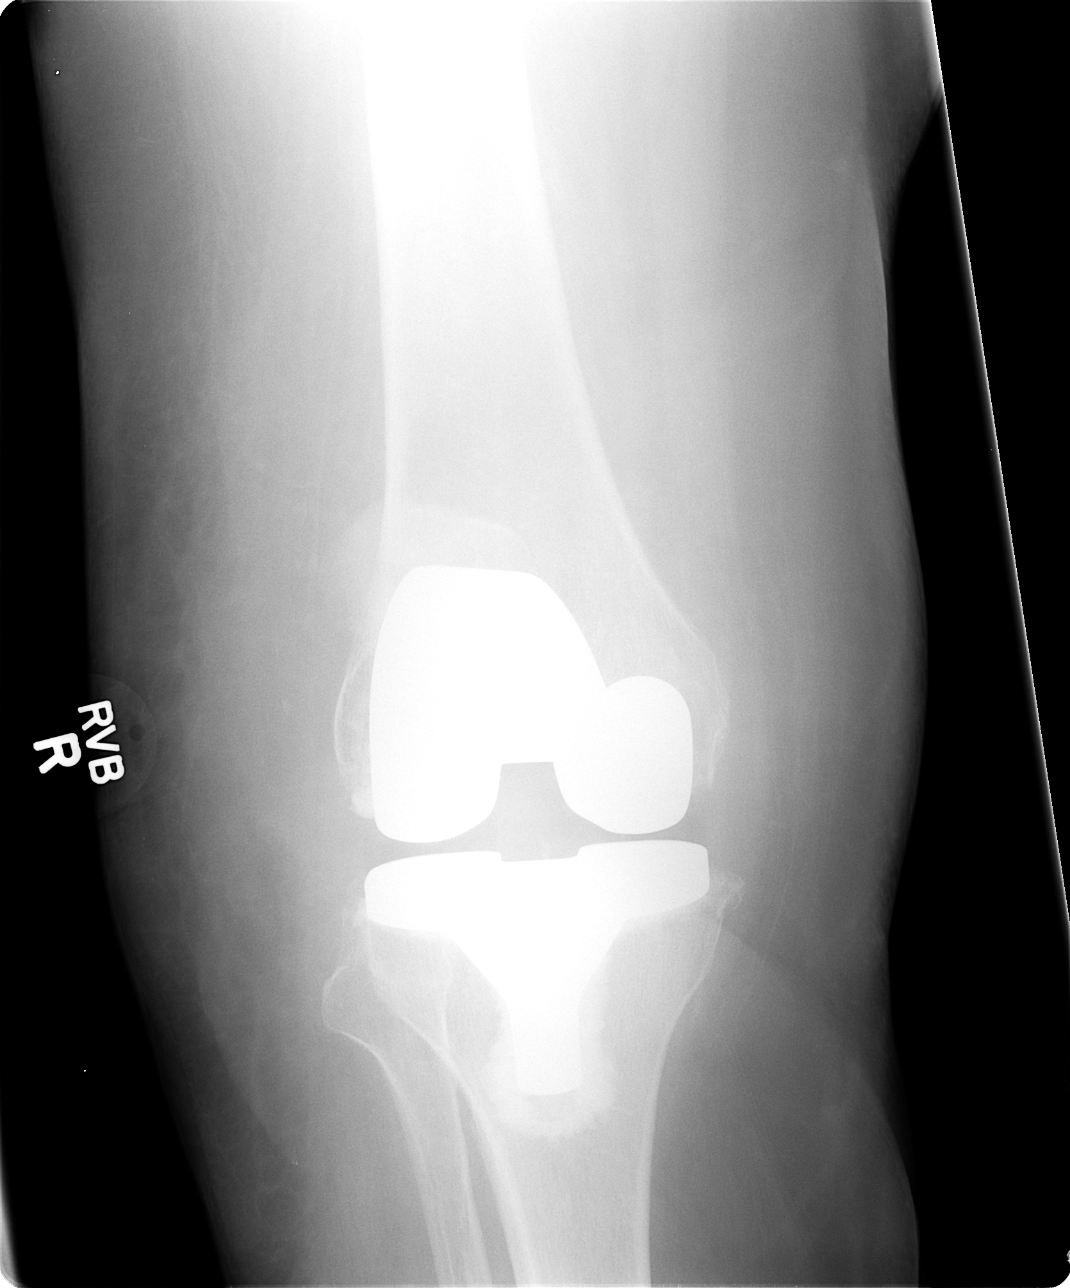

[view not recorded (3 of 4)]
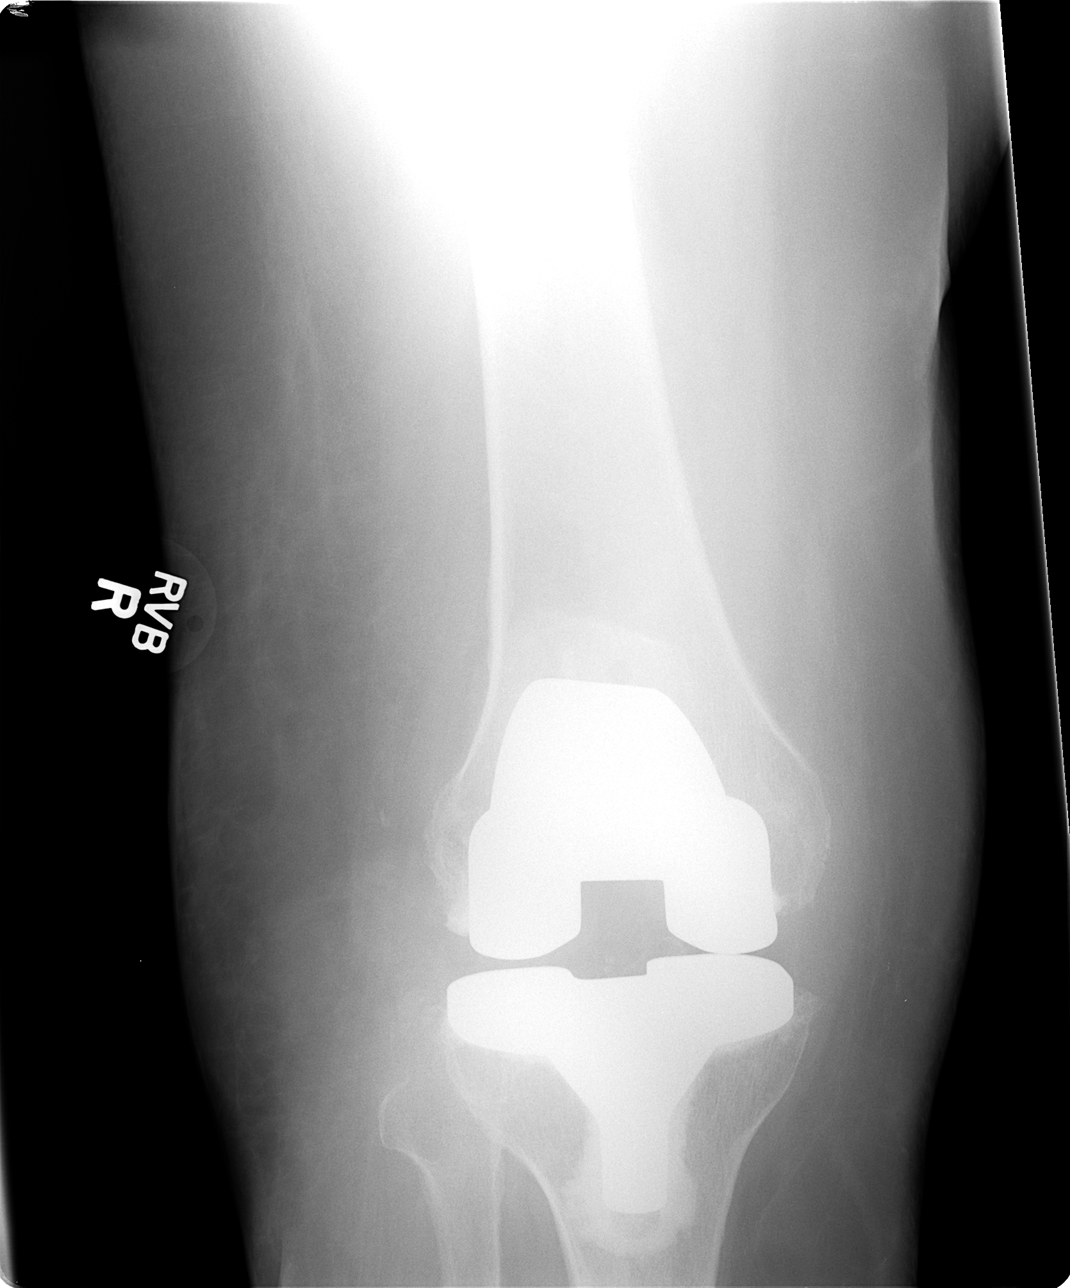

[view not recorded (4 of 4)]
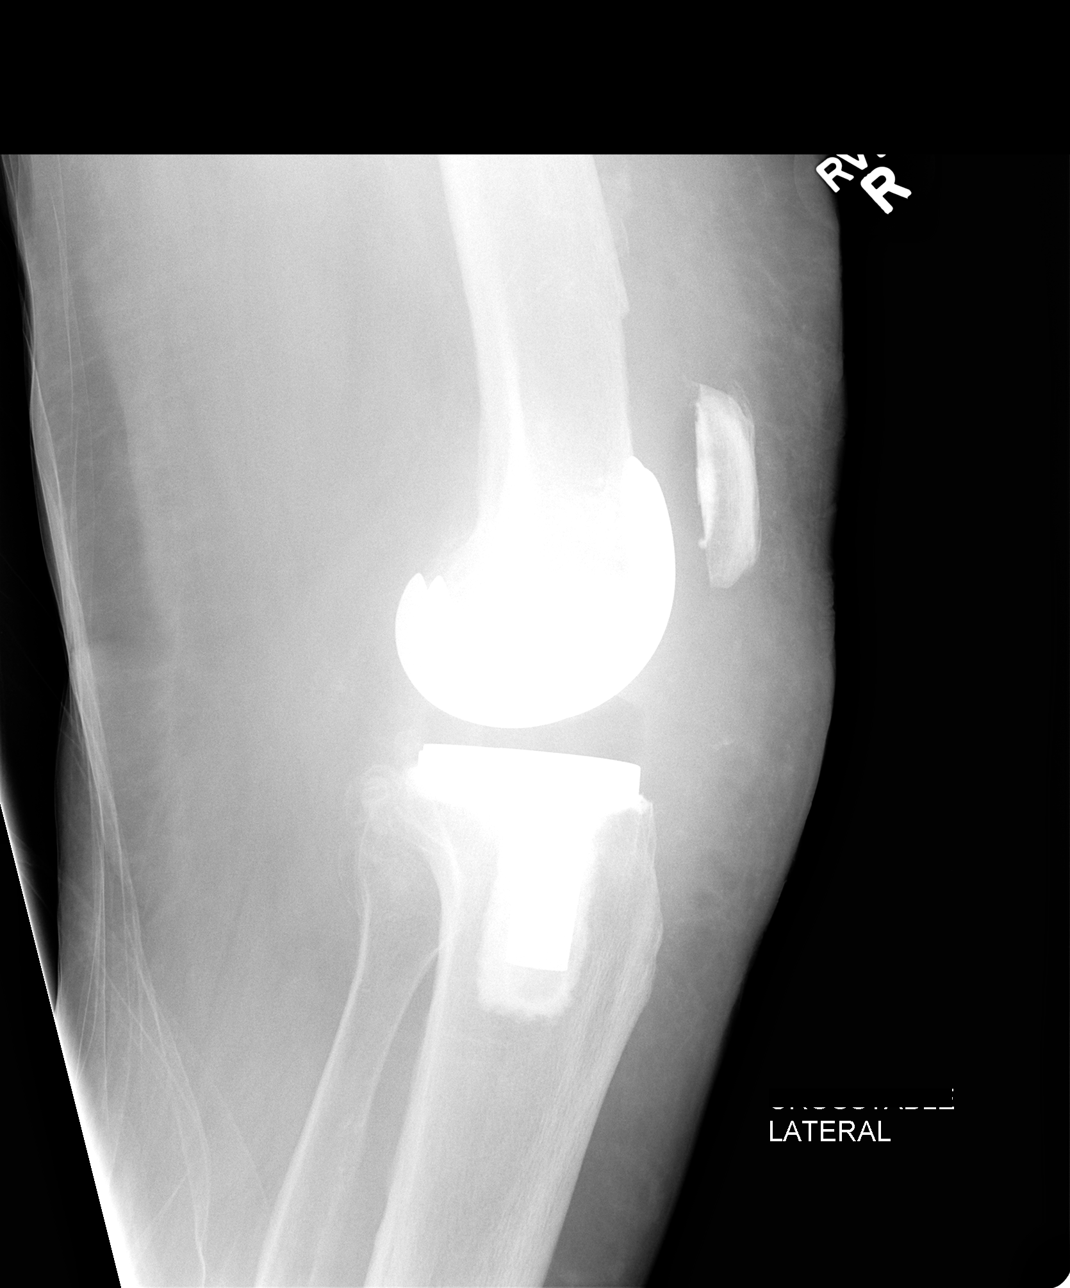

[4 of 4 positions shown; findings below may reference images not displayed]

FINDINGS: There are postsurgical changes of total right knee
arthroplasty.  No evidence of hardware loosening.  There is
prominence of the soft tissues inferior to the patella/anterior to
the knee joint.  Small joint effusion is suspected.

There is a 14 mm by 7 mm bony density in the soft tissues anterior
to the distal third of the femur.  This is likely a fractured and
displaced fragment from the distal femur, and there appears to be a
donor site along the the anterior aspect of the femur just proximal
to the hardware ( best seen on the lateral view).  A second smaller
bony fragment is seen, projecting over the distal femur on the
lateral view.
IMPRESSION: 1.  Prominence of the soft tissues inferior to the patella and
anterior to the, which could reflect soft tissue infection given
the clinical history. Suspect small suprapatellar joint effusion.

2.  14 mm bony fragment in the soft tissues of the anterior leg at
the level the distal femoral shaft and a small bony fragment
projecting over the distal femur in the lateral projection.  The
donor sites are likely the anterior cortex of the distal femur just
proximal to the hardware of the arthroplasty.

## 2012-09-06 ENCOUNTER — Ambulatory Visit (INDEPENDENT_AMBULATORY_CARE_PROVIDER_SITE_OTHER): Payer: Medicare Other | Admitting: Orthopedic Surgery

## 2012-09-06 ENCOUNTER — Encounter: Payer: Self-pay | Admitting: Orthopedic Surgery

## 2012-09-06 ENCOUNTER — Ambulatory Visit (INDEPENDENT_AMBULATORY_CARE_PROVIDER_SITE_OTHER): Payer: Medicare Other

## 2012-09-06 VITALS — Ht 69.0 in | Wt 262.0 lb

## 2012-09-06 DIAGNOSIS — Z9889 Other specified postprocedural states: Secondary | ICD-10-CM

## 2012-09-06 DIAGNOSIS — Z96659 Presence of unspecified artificial knee joint: Secondary | ICD-10-CM

## 2012-09-06 MED ORDER — HYDROCODONE-ACETAMINOPHEN 10-325 MG PO TABS: 1.0000 | ORAL_TABLET | Freq: Three times a day (TID) | ORAL | Status: DC | PRN

## 2012-09-06 NOTE — Patient Instructions (Addendum)
Continue brace for ambulation

## 2012-09-06 NOTE — Progress Notes (Signed)
Patient ID: [name], female   DOB: October 25, 1932, 78 y.o.   MRN: [id number] Chief Complaint  Patient presents with  . Follow-up    6 month recheck of right TKA./ index 8-2-012, graft 08/2011   1. Status post total knee replacement  DG Knee AP/LAT W/Sunrise Right, HYDROcodone-acetaminophen (NORCO) 10-325 MG per tablet  2. S/P knee replacement  HYDROcodone-acetaminophen (NORCO) 10-325 MG per tablet  3. S/P reconstruction of ligament of knee      One-year followup status post total knee arthroplasty complicated by ruptured patellar tendon and attempted reconstruction with allograft unsuccessful  Review of systems is negative   The patient is in the periphery with a brace locked in extension she has good flexion of her knee past 90. Passive range of motion total is 0-115. Her incision is clean dry and intact she has some swelling in her lower leg related to peripheral vascular disease mainly on the venous side. Skin is otherwise warm dry and intact. Sensation remains normal. She's awake alert and oriented x3.  Ht 5\' 9"  (1.753 m)  Wt 262 lb (118.842 kg)  BMI 38.69 kg/m2 Weak extension related to patellar tendon insufficiency  X-ray shows proximal migration of the patella indicating insufficiency of the patellar ligament  However she is pain-free she can and delayed in her home and out of her home in modified situations  At this point either one of [place] is pushing any surgical intervention  We'll see her in a year her implant is stable

## 2013-03-26 ENCOUNTER — Other Ambulatory Visit: Payer: Self-pay | Admitting: *Deleted

## 2013-03-26 DIAGNOSIS — Z96651 Presence of right artificial knee joint: Secondary | ICD-10-CM

## 2013-03-26 MED ORDER — HYDROCODONE-ACETAMINOPHEN 10-325 MG PO TABS: 1.0000 | ORAL_TABLET | Freq: Three times a day (TID) | ORAL | Status: DC | PRN

## 2013-09-05 ENCOUNTER — Ambulatory Visit: Payer: Self-pay | Admitting: Orthopedic Surgery

## 2013-09-06 ENCOUNTER — Ambulatory Visit: Payer: Medicare Other | Admitting: Orthopedic Surgery

## 2013-10-04 ENCOUNTER — Ambulatory Visit: Payer: Medicare Other | Admitting: Orthopedic Surgery

## 2013-10-23 ENCOUNTER — Encounter: Payer: Self-pay | Admitting: Orthopedic Surgery

## 2013-10-23 ENCOUNTER — Ambulatory Visit (INDEPENDENT_AMBULATORY_CARE_PROVIDER_SITE_OTHER): Payer: Medicare Other | Admitting: Orthopedic Surgery

## 2013-10-23 ENCOUNTER — Ambulatory Visit (INDEPENDENT_AMBULATORY_CARE_PROVIDER_SITE_OTHER): Payer: Medicare Other

## 2013-10-23 VITALS — BP 102/59 | Ht 69.0 in | Wt 220.0 lb

## 2013-10-23 DIAGNOSIS — Z9889 Other specified postprocedural states: Secondary | ICD-10-CM

## 2013-10-23 DIAGNOSIS — Z96659 Presence of unspecified artificial knee joint: Secondary | ICD-10-CM

## 2013-10-23 DIAGNOSIS — Z96651 Presence of right artificial knee joint: Secondary | ICD-10-CM

## 2013-10-23 MED ORDER — HYDROCODONE-ACETAMINOPHEN 10-325 MG PO TABS: 1.0000 | ORAL_TABLET | Freq: Three times a day (TID) | ORAL | Status: DC | PRN

## 2013-10-23 NOTE — Progress Notes (Signed)
Patient ID: [name], female   DOB: [birth year], 80 y.o.   MRN: [id number] Chief Complaint  Patient presents with  . Follow-up    Yearly follow up right TKA   Encounter Diagnoses  Name Primary?  . Status post right knee replacement Yes  . S/P reconstruction of ligament of knee    BP 102/59  Ht 5\' 9"  (1.753 m)  Wt 220 lb (99.791 kg)  BMI 32.47 kg/m2 The patient is now status post right total knee August of 2012, a patellar tendon rupture grafted in similar 2012 corrected patellar tendon. We determined that one year that the graft was unsuccessful. She is functional at home she exhibits and out of a wheelchair does all of her housework including laundry. She does have moderate pain not particularly in the right knee but generalized pain.  Review of systems is negative  Her knee flexion is obviously good her knee extension is 0 active normal passive  Incision healed. Defect from patella also noted. She has mild peripheral edema sensations intact she is awake alert and oriented x3 mood and affect are normal vital signs are stable  X-rays taken  Implant is stable patella rides high secondary to failed graft  Recommend bracing in extension for gait followup as needed  Meds ordered this encounter  Medications  . HYDROcodone-acetaminophen (NORCO) 10-325 MG per tablet    Sig: Take 1 tablet by mouth every 8 (eight) hours as needed.    Dispense:  180 tablet    Refill:  0

## 2013-10-23 NOTE — Patient Instructions (Signed)
Brace in extension for walking

## 2014-01-24 ENCOUNTER — Telehealth: Payer: Self-pay | Admitting: Orthopedic Surgery

## 2014-01-24 ENCOUNTER — Other Ambulatory Visit: Payer: Self-pay | Admitting: Orthopedic Surgery

## 2014-01-24 DIAGNOSIS — Z96651 Presence of right artificial knee joint: Secondary | ICD-10-CM

## 2014-01-24 MED ORDER — HYDROCODONE-ACETAMINOPHEN 10-325 MG PO TABS: 1.0000 | ORAL_TABLET | Freq: Three times a day (TID) | ORAL | Status: DC | PRN

## 2014-01-24 NOTE — Telephone Encounter (Signed)
Patient called to request refill on pain medication:  HYDROcodone-acetaminophen (NORCO) 10-325 MG per tablet [[id number]][[id number]].  Her next scheduled appointment is: 10/22/14. Her phone # is [phone].

## 2014-01-24 NOTE — Telephone Encounter (Signed)
Refilled and patient notified prescription was ready for pick up. 

## 2014-01-24 NOTE — Telephone Encounter (Signed)
Routing to Dr Harrison 

## 2014-01-29 NOTE — Telephone Encounter (Signed)
Patient picked up prescription 01/28/14.

## 2014-02-05 ENCOUNTER — Emergency Department (HOSPITAL_COMMUNITY)
Admission: EM | Admit: 2014-02-05 | Discharge: 2014-02-05 | Disposition: A | Discharge status: Home / Self Care | Payer: Medicare Other | Attending: Emergency Medicine | Admitting: Emergency Medicine

## 2014-02-05 ENCOUNTER — Encounter (HOSPITAL_COMMUNITY): Payer: Self-pay | Admitting: Emergency Medicine

## 2014-02-05 DIAGNOSIS — Z8709 Personal history of other diseases of the respiratory system: Secondary | ICD-10-CM | POA: Insufficient documentation

## 2014-02-05 DIAGNOSIS — F411 Generalized anxiety disorder: Secondary | ICD-10-CM | POA: Insufficient documentation

## 2014-02-05 DIAGNOSIS — Z792 Long term (current) use of antibiotics: Secondary | ICD-10-CM | POA: Insufficient documentation

## 2014-02-05 DIAGNOSIS — E039 Hypothyroidism, unspecified: Secondary | ICD-10-CM | POA: Insufficient documentation

## 2014-02-05 DIAGNOSIS — Z79899 Other long term (current) drug therapy: Secondary | ICD-10-CM | POA: Insufficient documentation

## 2014-02-05 DIAGNOSIS — F3289 Other specified depressive episodes: Secondary | ICD-10-CM | POA: Insufficient documentation

## 2014-02-05 DIAGNOSIS — R609 Edema, unspecified: Secondary | ICD-10-CM

## 2014-02-05 DIAGNOSIS — F329 Major depressive disorder, single episode, unspecified: Secondary | ICD-10-CM | POA: Insufficient documentation

## 2014-02-05 DIAGNOSIS — Z8719 Personal history of other diseases of the digestive system: Secondary | ICD-10-CM | POA: Insufficient documentation

## 2014-02-05 DIAGNOSIS — M129 Arthropathy, unspecified: Secondary | ICD-10-CM | POA: Insufficient documentation

## 2014-02-05 LAB — URINALYSIS, ROUTINE W REFLEX MICROSCOPIC
BILIRUBIN URINE: NEGATIVE
GLUCOSE, UA: NEGATIVE mg/dL
HGB URINE DIPSTICK: NEGATIVE
KETONES UR: NEGATIVE mg/dL
Leukocytes, UA: NEGATIVE
Nitrite: NEGATIVE
PH: 5.5 (ref 5.0–8.0)
Protein, ur: NEGATIVE mg/dL
Specific Gravity, Urine: 1.02 (ref 1.005–1.030)
Urobilinogen, UA: 0.2 mg/dL (ref 0.0–1.0)

## 2014-02-05 LAB — CBC
HCT: 39.9 % (ref 36.0–46.0)
HEMOGLOBIN: 13.2 g/dL (ref 12.0–15.0)
MCH: 28.4 pg (ref 26.0–34.0)
MCHC: 33.1 g/dL (ref 30.0–36.0)
MCV: 86 fL (ref 78.0–100.0)
Platelets: 257 10*3/uL (ref 150–400)
RBC: 4.64 MIL/uL (ref 3.87–5.11)
RDW: 13.3 % (ref 11.5–15.5)
WBC: 6.8 10*3/uL (ref 4.0–10.5)

## 2014-02-05 LAB — PRO B NATRIURETIC PEPTIDE: Pro B Natriuretic peptide (BNP): 192.4 pg/mL (ref 0–450)

## 2014-02-05 LAB — COMPREHENSIVE METABOLIC PANEL
ALBUMIN: 3.6 g/dL (ref 3.5–5.2)
ALK PHOS: 66 U/L (ref 39–117)
ALT: 8 U/L (ref 0–35)
AST: 17 U/L (ref 0–37)
BUN: 17 mg/dL (ref 6–23)
CO2: 27 mEq/L (ref 19–32)
Calcium: 9.1 mg/dL (ref 8.4–10.5)
Chloride: 101 mEq/L (ref 96–112)
Creatinine, Ser: 0.66 mg/dL (ref 0.50–1.10)
GFR calc Af Amer: >90 mL/min (ref >90)
GFR calc non Af Amer: 82 mL/min — ABNORMAL LOW (ref >90)
Glucose, Bld: 104 mg/dL — ABNORMAL HIGH (ref 70–99)
POTASSIUM: 3.8 meq/L (ref 3.7–5.3)
SODIUM: 140 meq/L (ref 137–147)
Total Bilirubin: 0.3 mg/dL (ref 0.3–1.2)
Total Protein: 6.4 g/dL (ref 6.0–8.3)

## 2014-02-05 MED ORDER — FUROSEMIDE 10 MG/ML IJ SOLN
40.0000 mg | Freq: Once | INTRAMUSCULAR | Status: AC
  Administered 2014-02-05: 40 mg via INTRAVENOUS
  Filled 2014-02-05: qty 4

## 2014-02-05 NOTE — ED Provider Notes (Signed)
CSN: [id number]     Arrival date & time 02/05/14  1418 History  This chart was scribed for Paige Creasehristopher J. Pollina, MD by Joaquin MusicKristina Sanchez-Matthews, ED Scribe. This patient was seen in room APA02/APA02 and the patient's care was started at 2:28 PM.   Chief Complaint  Patient presents with  . Foot Swelling   The history is provided by the patient. No language interpreter was used.   HPI Comments: [name] is a 80 y.o. female who presents to the Emergency Department complaining of sudden foot swelling with associated tingling to toes that began 1 week ago. Pt states she is treated for swelling but not CHF. Pt states she took a Laysics pill this morning in hope this would alleviated her sx. She reports having bilateral knee replacements. States she is breathing normal. Denies CP or SOB.  Past Medical History  Diagnosis Date  . Hyperlipidemia   . Hypothyroidism   . Anxiety   . Arthritis   . Chronic bronchitis   . Blood transfusion   . Constipation   . Thyroid disease   . Depression    Past Surgical History  Procedure Laterality Date  . Tubal ligation    . Joint replacement  05/2010    left total knee Dr. Romeo AppleHarrison  . Total thyroidectomy  March 2011    [facility]  . Left hip replacement @ [facility] '98    . Left total knee    . Total knee arthroplasty  05/03/2011    Procedure: TOTAL KNEE ARTHROPLASTY;  Surgeon: Fuller CanadaStanley Harrison, MD;  Location: [facility];  Service: Orthopedics;  Laterality: Right;  With DePuy  . Total hip arthroplasty    . Replacement total knee bilateral    . Patellar tendon repair  06/04/2011    Procedure: PATELLA TENDON REPAIR;  Surgeon: Fuller CanadaStanley Harrison, MD;  Location: [facility];  Service: Orthopedics;  Laterality: Right;  . Hematoma evacuation  06/04/2011    Procedure: EVACUATION HEMATOMA;  Surgeon: Fuller CanadaStanley Harrison, MD;  Location: [facility];  Service: Orthopedics;  Laterality: Right;  evacuation of seroma  . Eye surgery  2009    bilateral  . Patellar tendon repair   08/30/2011    Procedure: PATELLA TENDON REPAIR;  Surgeon: Fuller CanadaStanley Harrison, MD;  Location: [facility];  Service: Orthopedics;  Laterality: Right;  Allograft Reconstrucation Right Patella Tendon   Family History  Problem Relation Age of Onset  . Anesthesia problems Neg Hx   . Hypotension Neg Hx   . Malignant hyperthermia Neg Hx   . Pseudochol deficiency Neg Hx    History  Substance Use Topics  . Smoking status: Never Smoker   . Smokeless tobacco: Never Used  . Alcohol Use: No   OB History   Grav Para Term Preterm Abortions TAB SAB Ect Mult Living                 Review of Systems  Respiratory: Negative for shortness of breath.   Cardiovascular: Positive for leg swelling. Negative for chest pain.  All other systems reviewed and are negative.   Allergies  Review of patient's allergies indicates no known allergies.  Home Medications   Prior to Admission medications   Medication Sig Start Date End Date Taking? Authorizing Provider  ALPRAZolam (XANAX) 0.25 MG tablet Take 0.25 mg by mouth at bedtime as needed. For anxiety    Historical Provider, MD  azithromycin (ZITHROMAX) 250 MG tablet  11/03/11   Historical Provider, MD  clotrimazole (LOTRIMIN) 1 %  cream Apply topically 2 (two) times daily between meals as needed. For rash     Historical Provider, MD  furosemide (LASIX) 40 MG tablet Take 40 mg by mouth daily.      Historical Provider, MD  HYDROcodone-acetaminophen (NORCO) 10-325 MG per tablet Take 1 tablet by mouth every 8 (eight) hours as needed. 01/24/14   Vickki Hearing, MD  levothyroxine (SYNTHROID, LEVOTHROID) 150 MCG tablet Take 150 mcg by mouth every morning.      Historical Provider, MD  PARoxetine (PAXIL) 20 MG tablet Take 20 mg by mouth every evening.      Historical Provider, MD   BP 151/56  Pulse 64  Temp(Src) 99.1 F (37.3 C) (Oral)  Resp 18  Ht 5' 9.25" (1.759 m)  SpO2 96%  Physical Exam  Constitutional: She is oriented to person, place, and time. She appears  well-developed and well-nourished. No distress.  HENT:  Head: Normocephalic and atraumatic.  Right Ear: Hearing normal.  Left Ear: Hearing normal.  Nose: Nose normal.  Mouth/Throat: Oropharynx is clear and moist and mucous membranes are normal.  Eyes: Conjunctivae and EOM are normal. Pupils are equal, round, and reactive to light.  Neck: Normal range of motion. Neck supple.  Cardiovascular: Regular rhythm, S1 normal and S2 normal.  Exam reveals no gallop and no friction rub.   No murmur heard. 2+ bilateral pitting edema to feet and lower legs.  Pulmonary/Chest: Effort normal and breath sounds normal. No respiratory distress. She exhibits no tenderness.  Abdominal: Soft. Normal appearance and bowel sounds are normal. There is no hepatosplenomegaly. There is no tenderness. There is no rebound, no guarding, no tenderness at McBurney's point and negative Murphy's sign. No hernia.  Musculoskeletal: Normal range of motion.  Neurological: She is alert and oriented to person, place, and time. She has normal strength. No cranial nerve deficit or sensory deficit. Coordination normal. GCS eye subscore is 4. GCS verbal subscore is 5. GCS motor subscore is 6.  Skin: Skin is warm, dry and intact. No rash noted. No cyanosis.  Psychiatric: She has a normal mood and affect. Her speech is normal and behavior is normal. Thought content normal.    ED Course  Procedures  DIAGNOSTIC STUDIES: Oxygen Saturation is 96% on RA, normal by my interpretation.    COORDINATION OF CARE: 2:30 PM-Discussed treatment plan which includes labs and pain medications IV.  Pt agreed to plan.   Labs Review Labs Reviewed  COMPREHENSIVE METABOLIC PANEL - Abnormal; Notable for the following:    Glucose, Bld 104 (*)    GFR calc non Af Amer 82 (*)    All other components within normal limits  CBC  URINALYSIS, ROUTINE W REFLEX MICROSCOPIC  PRO B NATRIURETIC PEPTIDE    Imaging Review No results found.   EKG  Interpretation   Date/Time:  Tuesday Feb 05 2014 15:41:37 EDT Ventricular Rate:  67 PR Interval:  163 QRS Duration: 116 QT Interval:  424 QTC Calculation: 448 R Axis:   -16 Text Interpretation:  Sinus rhythm Nonspecific intraventricular conduction  delay Low voltage, precordial leads No significant change since last  tracing Confirmed by POLLINA  MD, CHRISTOPHER [phone]) on 02/05/2014 4:10:15  PM      MDM   Final diagnoses:  None    Patient presents to the ER for evaluation of decreased volume of her feet. She admits to missing doses of Lasix. She has been on Lasix chronically for peripheral edema, no history of congestive heart failure. Currently there  is no concern for congestive heart failure. She does have swelling of the lower extremities and feet, but there is no difficulty breathing. Lungs are clear. She has normal workup including EKG, troponin, BNP. Patient had taken an extra dose of Lasix prior to arrival, but had not had any urine output. She was given an IV dose here and will double her dose for the next 3 days, stay off her feet and elevate them. Followup with Doctor Juanetta GoslingHawkins.  I personally performed the services described in this documentation, which was scribed in my presence. The recorded information has been reviewed and is accurate.     Paige Creasehristopher J. Pollina, MD 02/05/14 [phone]

## 2014-02-05 NOTE — Discharge Instructions (Signed)
Double your dose of Lasix for the next 3 days. Followup with Doctor Juanetta GoslingHawkins in the office before the weekend for a recheck. Return to the ER for any chest pain, shortness of breath, redness or color change of the legs.  Peripheral Edema You have swelling in your legs (peripheral edema). This swelling is due to excess accumulation of salt and water in your body. Edema may be a sign of heart, kidney or liver disease, or a side effect of a medication. It may also be due to problems in the leg veins. Elevating your legs and using special support stockings may be very helpful, if the cause of the swelling is due to poor venous circulation. Avoid long periods of standing, whatever the cause. Treatment of edema depends on identifying the cause. Chips, pretzels, pickles and other salty foods should be avoided. Restricting salt in your diet is almost always needed. Water pills (diuretics) are often used to remove the excess salt and water from your body via urine. These medicines prevent the kidney from reabsorbing sodium. This increases urine flow. Diuretic treatment may also result in lowering of potassium levels in your body. Potassium supplements may be needed if you have to use diuretics daily. Daily weights can help you keep track of your progress in clearing your edema. You should call your caregiver for follow up care as recommended. SEEK IMMEDIATE MEDICAL CARE IF:   You have increased swelling, pain, redness, or heat in your legs.  You develop shortness of breath, especially when lying down.  You develop chest or abdominal pain, weakness, or fainting.  You have a fever. Document Released: 10/21/2004 Document Revised: 12/06/2011 Document Reviewed: 10/01/2009 [facility] Patient Information 2014 [place] [place].

## 2014-02-05 NOTE — ED Notes (Addendum)
Pt c/o bilateral foot swelling x1 week. Pt states "I might have forgotten a couple doses of Lasix". Pt denies pain in feet but reports intermittent "tingling".

## 2014-04-25 ENCOUNTER — Telehealth: Payer: Self-pay | Admitting: Orthopedic Surgery

## 2014-04-25 DIAGNOSIS — Z96651 Presence of right artificial knee joint: Secondary | ICD-10-CM

## 2014-04-25 NOTE — Telephone Encounter (Signed)
Paige StandardFrances Chen wants a Hydrocodone 10/325 prescription

## 2014-04-26 ENCOUNTER — Emergency Department (HOSPITAL_COMMUNITY): Payer: Medicare Other

## 2014-04-26 ENCOUNTER — Encounter (HOSPITAL_COMMUNITY): Payer: Self-pay | Admitting: Emergency Medicine

## 2014-04-26 ENCOUNTER — Emergency Department (HOSPITAL_COMMUNITY)
Admission: EM | Admit: 2014-04-26 | Discharge: 2014-04-26 | Disposition: A | Discharge status: Home / Self Care | Payer: Medicare Other | Attending: Emergency Medicine | Admitting: Emergency Medicine

## 2014-04-26 DIAGNOSIS — Z79899 Other long term (current) drug therapy: Secondary | ICD-10-CM | POA: Diagnosis not present

## 2014-04-26 DIAGNOSIS — F411 Generalized anxiety disorder: Secondary | ICD-10-CM | POA: Diagnosis not present

## 2014-04-26 DIAGNOSIS — S99919A Unspecified injury of unspecified ankle, initial encounter: Secondary | ICD-10-CM | POA: Diagnosis present

## 2014-04-26 DIAGNOSIS — Y929 Unspecified place or not applicable: Secondary | ICD-10-CM | POA: Insufficient documentation

## 2014-04-26 DIAGNOSIS — S8990XA Unspecified injury of unspecified lower leg, initial encounter: Secondary | ICD-10-CM | POA: Diagnosis present

## 2014-04-26 DIAGNOSIS — S99929A Unspecified injury of unspecified foot, initial encounter: Secondary | ICD-10-CM

## 2014-04-26 DIAGNOSIS — Y939 Activity, unspecified: Secondary | ICD-10-CM | POA: Insufficient documentation

## 2014-04-26 DIAGNOSIS — Z8719 Personal history of other diseases of the digestive system: Secondary | ICD-10-CM | POA: Diagnosis not present

## 2014-04-26 DIAGNOSIS — Z8709 Personal history of other diseases of the respiratory system: Secondary | ICD-10-CM | POA: Diagnosis not present

## 2014-04-26 DIAGNOSIS — Z7982 Long term (current) use of aspirin: Secondary | ICD-10-CM | POA: Diagnosis not present

## 2014-04-26 DIAGNOSIS — F329 Major depressive disorder, single episode, unspecified: Secondary | ICD-10-CM | POA: Diagnosis not present

## 2014-04-26 DIAGNOSIS — S8000XA Contusion of unspecified knee, initial encounter: Secondary | ICD-10-CM | POA: Diagnosis not present

## 2014-04-26 DIAGNOSIS — R296 Repeated falls: Secondary | ICD-10-CM | POA: Diagnosis not present

## 2014-04-26 DIAGNOSIS — Z96649 Presence of unspecified artificial hip joint: Secondary | ICD-10-CM | POA: Insufficient documentation

## 2014-04-26 DIAGNOSIS — F3289 Other specified depressive episodes: Secondary | ICD-10-CM | POA: Diagnosis not present

## 2014-04-26 DIAGNOSIS — M129 Arthropathy, unspecified: Secondary | ICD-10-CM | POA: Insufficient documentation

## 2014-04-26 DIAGNOSIS — S8002XA Contusion of left knee, initial encounter: Secondary | ICD-10-CM

## 2014-04-26 DIAGNOSIS — E039 Hypothyroidism, unspecified: Secondary | ICD-10-CM | POA: Insufficient documentation

## 2014-04-26 MED ORDER — HYDROCODONE-ACETAMINOPHEN 10-325 MG PO TABS: 1.0000 | ORAL_TABLET | Freq: Four times a day (QID) | ORAL | Status: DC | PRN

## 2014-04-26 MED ORDER — ONDANSETRON HCL 4 MG PO TABS: 4.0000 mg | ORAL_TABLET | Freq: Once | ORAL | Status: DC

## 2014-04-26 MED ORDER — HYDROCODONE-ACETAMINOPHEN 5-325 MG PO TABS
2.0000 | ORAL_TABLET | Freq: Once | ORAL | Status: AC
  Administered 2014-04-26: 2 via ORAL
  Filled 2014-04-26: qty 2

## 2014-04-26 MED ORDER — HYDROCODONE-ACETAMINOPHEN 5-325 MG PO TABS: 1.0000 | ORAL_TABLET | Freq: Once | ORAL | Status: DC

## 2014-04-26 NOTE — ED Provider Notes (Signed)
CSN: [id number]     Arrival date & time 04/26/14  1020 History   First MD Initiated Contact with Patient 04/26/14 1035     Chief Complaint  Patient presents with  . Fall     (Consider location/radiation/quality/duration/timing/severity/associated sxs/prior Treatment) HPI Comments: Patient is-year-old female who presents to the emergency department by EMS following a fall. The patient states that she lives at home by herself. She has a history of bilateral knee replacements and a left total hip replacement. This morning approximately 2:30 AM she had to get up from her reclining chair to attempt to get into her wheelchair to go to the bathroom. She sustained a fall. Patient states that when she stood her knees" buckled under her". Patient states she had another fall approximately 6 AM. On both occasions she had to notify EMS to come out and help her get up from the floor.  Patient is a 80 y.o. female presenting with fall. The history is provided by the patient.  Fall Associated symptoms include arthralgias. Pertinent negatives include no abdominal pain, chest pain, coughing or neck pain.    Past Medical History  Diagnosis Date  . Hyperlipidemia   . Hypothyroidism   . Anxiety   . Arthritis   . Chronic bronchitis   . Blood transfusion   . Constipation   . Thyroid disease   . Depression    Past Surgical History  Procedure Laterality Date  . Tubal ligation    . Joint replacement  05/2010    left total knee Dr. Romeo Apple  . Total thyroidectomy  March 2011    Dr. Suszanne Conners @ [facility]  . Left hip replacement @ [facility] '98    . Left total knee    . Total knee arthroplasty  05/03/2011    Procedure: TOTAL KNEE ARTHROPLASTY;  Surgeon: Fuller Canada, MD;  Location: [facility];  Service: Orthopedics;  Laterality: Right;  With DePuy  . Total hip arthroplasty    . Replacement total knee bilateral    . Patellar tendon repair  06/04/2011    Procedure: PATELLA TENDON REPAIR;  Surgeon: Fuller Canada, MD;   Location: [facility];  Service: Orthopedics;  Laterality: Right;  . Hematoma evacuation  06/04/2011    Procedure: EVACUATION HEMATOMA;  Surgeon: Fuller Canada, MD;  Location: [facility];  Service: Orthopedics;  Laterality: Right;  evacuation of seroma  . Eye surgery  2009    bilateral  . Patellar tendon repair  08/30/2011    Procedure: PATELLA TENDON REPAIR;  Surgeon: Fuller Canada, MD;  Location: [facility];  Service: Orthopedics;  Laterality: Right;  Allograft Reconstrucation Right Patella Tendon   Family History  Problem Relation Age of Onset  . Anesthesia problems Neg Hx   . Hypotension Neg Hx   . Malignant hyperthermia Neg Hx   . Pseudochol deficiency Neg Hx    History  Substance Use Topics  . Smoking status: Never Smoker   . Smokeless tobacco: Never Used  . Alcohol Use: No   OB History   Grav Para Term Preterm Abortions TAB SAB Ect Mult Living                 Review of Systems  Constitutional: Negative for activity change.       All ROS Neg except as noted in HPI  HENT: Negative for nosebleeds.   Eyes: Negative for photophobia and discharge.  Respiratory: Negative for cough, shortness of breath and wheezing.   Cardiovascular: Negative for chest pain and palpitations.  Gastrointestinal: Positive for constipation. Negative for abdominal pain and blood in stool.  Genitourinary: Negative for dysuria, frequency and hematuria.  Musculoskeletal: Positive for arthralgias. Negative for back pain and neck pain.  Skin: Negative.   Neurological: Negative for dizziness, seizures and speech difficulty.  Psychiatric/Behavioral: Negative for hallucinations and confusion. The patient is nervous/anxious.       Allergies  Review of patient's allergies indicates no known allergies.  Home Medications   Prior to Admission medications   Medication Sig Start Date End Date Taking? Authorizing Provider  ALPRAZolam Prudy Feeler(XANAX) 0.25 MG tablet Take 0.5 mg by mouth daily as needed for anxiety. For  anxiety   Yes Historical Provider, MD  aspirin EC 81 MG tablet Take 81 mg by mouth daily.   Yes Historical Provider, MD  furosemide (LASIX) 40 MG tablet Take 40 mg by mouth daily.     Yes Historical Provider, MD  HYDROcodone-acetaminophen (NORCO) 10-325 MG per tablet Take 1 tablet by mouth every 8 (eight) hours as needed. 01/24/14  Yes Vickki HearingStanley E Harrison, MD  ibuprofen (ADVIL,MOTRIN) 200 MG tablet Take 600 mg by mouth daily as needed for moderate pain.   Yes Historical Provider, MD  levothyroxine (SYNTHROID, LEVOTHROID) 150 MCG tablet Take 150 mcg by mouth every morning.     Yes Historical Provider, MD  PARoxetine (PAXIL) 20 MG tablet Take 20 mg by mouth every evening.     Yes Historical Provider, MD   BP 138/56  Pulse 92  Temp(Src) 98.3 F (36.8 C) (Oral)  Resp 18  SpO2 97% Physical Exam  Nursing note and vitals reviewed. Constitutional: She is oriented to person, place, and time. She appears well-developed and well-nourished.  Non-toxic appearance.  HENT:  Head: Normocephalic.  Right Ear: Tympanic membrane and external ear normal.  Left Ear: Tympanic membrane and external ear normal.  Eyes: EOM and lids are normal. Pupils are equal, round, and reactive to light.  Neck: Normal range of motion. Neck supple. Carotid bruit is not present.  Cardiovascular: Normal rate, regular rhythm, normal heart sounds, intact distal pulses and normal pulses.   Pulmonary/Chest: Breath sounds normal. No respiratory distress.  Abdominal: Soft. Bowel sounds are normal. There is no tenderness. There is no guarding.  Musculoskeletal: Normal range of motion.   Deformity of the right or left hip. There is a well-healed surgical scar from the previous left hip total replacement.  There will surgical scars of the right and left knee from total knee replacements. There is soreness to palpation of the right and left knee. There is a small bruise area of the left knee. There is full range of motion of both ankles. The  dorsalis pedis pulses are 2+ bilaterally.  Lymphadenopathy:       Head (right side): No submandibular adenopathy present.       Head (left side): No submandibular adenopathy present.    She has no cervical adenopathy.  Neurological: She is alert and oriented to person, place, and time. She has normal strength. No cranial nerve deficit or sensory deficit.  Skin: Skin is warm and dry.  Psychiatric: She has a normal mood and affect. Her speech is normal.    ED Course  Procedures (including critical care time) Labs Review Labs Reviewed - No data to display  Imaging Review No results found.   EKG Interpretation None      MDM X-ray of the left knee reveals the total knee arthroplasty in place. Hardware is in place without problem. There is ossification of  the distal quadriceps consistent with chronic tendinosis. X-ray of the pelvis is negative for fracture or dislocation. The prosthetic left hip is intact. There degenerative changes of the right hip.  Patient feels some better after oral hydrocodone given. Patient was able to stand with assistance but she does not ambulate. She is back at her baseline according to the family members.  Patient advised to follow with her primary physician. Prescription for Norco 10 mg given to the patient. She will follow with her primary physician or orthopedic specialist next week.    Final diagnoses:  None    *I have reviewed nursing notes, vital signs, and all appropriate lab and imaging results for this patient.    Kathie Dike, PA-C 04/27/14 [phone]

## 2014-04-26 NOTE — ED Notes (Addendum)
Pt states she fell last night, stating she went down to her knees and then fell over on her hip. Pt has hx of surgery to both knees, most recent being to right knee, which pt states gives out easily (causing her fall). Pt states she ran out of hydrocodone 2 days ago, waiting for refill authorization. Pain only to left knee at this time.

## 2014-04-26 NOTE — ED Notes (Signed)
Pt alert & oriented x4, stable gait. Patient given discharge instructions, paperwork & prescription(s). Patient  instructed to stop at the registration desk to finish any additional paperwork. Patient verbalized understanding. Pt left department w/ no further questions. 

## 2014-04-26 NOTE — ED Provider Notes (Signed)
Patient fell 2 AM today when my knees "buckled" patient is wheelchair-bound. He can take it from wheelchair to wheelchair. She is nonambulatory.  Doug SouSam Reggie Bise, MD 04/26/14 1504

## 2014-04-26 NOTE — Discharge Instructions (Signed)
Your x-rays are negative for fractures or dislocations. Your hardware is intact. Please see Dr. Juanetta GoslingHawkins, or Dr. Romeo AppleHarrison for followup and recheck. Please do not use your Norco and your Xanax within 4 hours of each other, as both of these can cause drowsiness and difficulty with balance.

## 2014-04-26 NOTE — ED Notes (Signed)
Pt able to stand as usual, does not ambulate at home, only pivots to wheelchair. Pt states she feels much better and would like to have enough pain medicine to last here until she can see Dr. Romeo AppleHarrison.

## 2014-04-27 NOTE — ED Provider Notes (Signed)
Medical screening examination/treatment/procedure(s) were conducted as a shared visit with non-physician practitioner(s) and myself.  I personally evaluated the patient during the encounter.   EKG Interpretation None       Doug SouSam Chikita Dogan, MD 04/27/14 [phone]

## 2014-04-29 ENCOUNTER — Telehealth: Payer: Self-pay | Admitting: Orthopedic Surgery

## 2014-04-29 ENCOUNTER — Other Ambulatory Visit: Payer: Self-pay | Admitting: *Deleted

## 2014-04-29 MED ORDER — HYDROCODONE-ACETAMINOPHEN 10-325 MG PO TABS: 1.0000 | ORAL_TABLET | Freq: Three times a day (TID) | ORAL | Status: DC | PRN

## 2014-04-29 NOTE — Telephone Encounter (Signed)
FYI ... As well as status check on refill request-assume nurse has phone note from NimrodBrenda from 04/25/14.

## 2014-04-29 NOTE — Telephone Encounter (Signed)
Ok   Is there a question or is this fyi?

## 2014-04-29 NOTE — Telephone Encounter (Signed)
Patient called (also patient's daughter Alona BeneJoyce had called, and had stopped into office today, 04/29/14) - relays that patient was [facility] in [facility] on Friday, 04/26/14, following a fall; said had hurt her left knee, although Xrays "looked fine", although did say she wanted Dr Romeo AppleHarrison to know about.   She states she had been prescribed pain medication at Emergency department, but only enough for 2 days.  She is inquiring about request for the pain medication refill she had called in late Thursday afternoon.  Relayed that Dr Romeo AppleHarrison had already been out of office, then in surgery on Friday; therefore, requests are addressed on the next clinic day.  Patient's ph# [phone]

## 2014-04-30 NOTE — Telephone Encounter (Signed)
Done, and patient picked up her prescription following notification per nurse.

## 2014-10-01 ENCOUNTER — Telehealth: Payer: Self-pay | Admitting: Orthopedic Surgery

## 2014-10-01 NOTE — Telephone Encounter (Signed)
Patient called, states wishes to cancel upcoming appointment for yearly checkup with Xrays on 10/24/14; states she feels she does not want to take up Dr Mort SawyersHarrison's time or her own when it doesn't seem that anything further can be done for her.  She referred to a fall she had this past summer in which she went to the Emergency room, and said she still can't stand or walk, but is able to still live alone, and do things for herself.  She said she will not be coming back, nor is she seeing another orthopedist at this time.  She thanks Dr Romeo AppleHarrison and the staff for the care she has received.

## 2014-10-24 ENCOUNTER — Ambulatory Visit: Payer: Medicare Other | Admitting: Orthopedic Surgery

## 2015-06-06 ENCOUNTER — Encounter: Payer: Self-pay | Admitting: Internal Medicine

## 2015-06-26 ENCOUNTER — Ambulatory Visit: Payer: Self-pay | Admitting: Gastroenterology

## 2016-06-11 DIAGNOSIS — L851 Acquired keratosis [keratoderma] palmaris et plantaris: Secondary | ICD-10-CM | POA: Diagnosis not present

## 2016-06-11 DIAGNOSIS — I739 Peripheral vascular disease, unspecified: Secondary | ICD-10-CM | POA: Diagnosis not present

## 2016-06-11 DIAGNOSIS — B351 Tinea unguium: Secondary | ICD-10-CM | POA: Diagnosis not present

## 2016-07-26 DIAGNOSIS — H524 Presbyopia: Secondary | ICD-10-CM | POA: Diagnosis not present

## 2016-08-27 DIAGNOSIS — B351 Tinea unguium: Secondary | ICD-10-CM | POA: Diagnosis not present

## 2016-08-27 DIAGNOSIS — L851 Acquired keratosis [keratoderma] palmaris et plantaris: Secondary | ICD-10-CM | POA: Diagnosis not present

## 2016-08-27 DIAGNOSIS — I739 Peripheral vascular disease, unspecified: Secondary | ICD-10-CM | POA: Diagnosis not present

## 2016-08-30 ENCOUNTER — Encounter (HOSPITAL_COMMUNITY): Payer: Self-pay | Admitting: Emergency Medicine

## 2016-08-30 ENCOUNTER — Observation Stay (HOSPITAL_COMMUNITY)
Admission: EM | Admit: 2016-08-30 | Discharge: 2016-08-31 | Disposition: A | Discharge status: Home / Self Care | Payer: Medicare Other | Attending: Pulmonary Disease | Admitting: Pulmonary Disease

## 2016-08-30 ENCOUNTER — Emergency Department (HOSPITAL_COMMUNITY): Payer: Medicare Other

## 2016-08-30 ENCOUNTER — Observation Stay (HOSPITAL_BASED_OUTPATIENT_CLINIC_OR_DEPARTMENT_OTHER): Payer: Medicare Other

## 2016-08-30 DIAGNOSIS — E785 Hyperlipidemia, unspecified: Secondary | ICD-10-CM | POA: Diagnosis present

## 2016-08-30 DIAGNOSIS — R9431 Abnormal electrocardiogram [ECG] [EKG]: Secondary | ICD-10-CM

## 2016-08-30 DIAGNOSIS — R030 Elevated blood-pressure reading, without diagnosis of hypertension: Secondary | ICD-10-CM | POA: Diagnosis not present

## 2016-08-30 DIAGNOSIS — Z7982 Long term (current) use of aspirin: Secondary | ICD-10-CM | POA: Insufficient documentation

## 2016-08-30 DIAGNOSIS — R109 Unspecified abdominal pain: Secondary | ICD-10-CM | POA: Insufficient documentation

## 2016-08-30 DIAGNOSIS — R079 Chest pain, unspecified: Principal | ICD-10-CM | POA: Diagnosis present

## 2016-08-30 DIAGNOSIS — I10 Essential (primary) hypertension: Secondary | ICD-10-CM | POA: Diagnosis present

## 2016-08-30 DIAGNOSIS — R072 Precordial pain: Secondary | ICD-10-CM

## 2016-08-30 DIAGNOSIS — E782 Mixed hyperlipidemia: Secondary | ICD-10-CM

## 2016-08-30 DIAGNOSIS — Z79899 Other long term (current) drug therapy: Secondary | ICD-10-CM | POA: Insufficient documentation

## 2016-08-30 DIAGNOSIS — I350 Nonrheumatic aortic (valve) stenosis: Secondary | ICD-10-CM | POA: Diagnosis not present

## 2016-08-30 DIAGNOSIS — E039 Hypothyroidism, unspecified: Secondary | ICD-10-CM | POA: Diagnosis present

## 2016-08-30 DIAGNOSIS — I119 Hypertensive heart disease without heart failure: Secondary | ICD-10-CM | POA: Diagnosis not present

## 2016-08-30 DIAGNOSIS — R0789 Other chest pain: Secondary | ICD-10-CM | POA: Diagnosis not present

## 2016-08-30 DIAGNOSIS — R001 Bradycardia, unspecified: Secondary | ICD-10-CM | POA: Diagnosis not present

## 2016-08-30 DIAGNOSIS — R011 Cardiac murmur, unspecified: Secondary | ICD-10-CM

## 2016-08-30 LAB — HEPATIC FUNCTION PANEL
ALK PHOS: 72 U/L (ref 38–126)
ALT: 10 U/L — AB (ref 14–54)
AST: 17 U/L (ref 15–41)
Albumin: 3.8 g/dL (ref 3.5–5.0)
BILIRUBIN INDIRECT: 0.3 mg/dL (ref 0.3–0.9)
Bilirubin, Direct: 0.1 mg/dL (ref 0.1–0.5)
Total Bilirubin: 0.4 mg/dL (ref 0.3–1.2)
Total Protein: 6.8 g/dL (ref 6.5–8.1)

## 2016-08-30 LAB — BASIC METABOLIC PANEL
Anion gap: 8 (ref 5–15)
BUN: 15 mg/dL (ref 6–20)
CALCIUM: 8.6 mg/dL — AB (ref 8.9–10.3)
CO2: 27 mmol/L (ref 22–32)
Chloride: 101 mmol/L (ref 101–111)
Creatinine, Ser: 0.69 mg/dL (ref 0.44–1.00)
GFR calc Af Amer: >60 mL/min (ref >60)
GLUCOSE: 120 mg/dL — AB (ref 65–99)
Potassium: 3.6 mmol/L (ref 3.5–5.1)
Sodium: 136 mmol/L (ref 135–145)

## 2016-08-30 LAB — CBC WITH DIFFERENTIAL/PLATELET
BASOS ABS: 0.1 10*3/uL (ref 0.0–0.1)
Basophils Relative: 1 %
EOS ABS: 0.3 10*3/uL (ref 0.0–0.7)
EOS PCT: 3 %
HCT: 44.5 % (ref 36.0–46.0)
Hemoglobin: 14.5 g/dL (ref 12.0–15.0)
Lymphocytes Relative: 24 %
Lymphs Abs: 2.5 10*3/uL (ref 0.7–4.0)
MCH: 28.1 pg (ref 26.0–34.0)
MCHC: 32.6 g/dL (ref 30.0–36.0)
MCV: 86.2 fL (ref 78.0–100.0)
MONO ABS: 0.7 10*3/uL (ref 0.1–1.0)
Monocytes Relative: 7 %
Neutro Abs: 6.7 10*3/uL (ref 1.7–7.7)
Neutrophils Relative %: 65 %
PLATELETS: 254 10*3/uL (ref 150–400)
RBC: 5.16 MIL/uL — ABNORMAL HIGH (ref 3.87–5.11)
RDW: 14 % (ref 11.5–15.5)
WBC: 10.2 10*3/uL (ref 4.0–10.5)

## 2016-08-30 LAB — I-STAT TROPONIN, ED: Troponin i, poc: 0 ng/mL (ref 0.00–0.08)

## 2016-08-30 LAB — LIPASE, BLOOD: Lipase: 27 U/L (ref 11–51)

## 2016-08-30 LAB — I-STAT CHEM 8, ED
BUN: 18 mg/dL (ref 6–20)
CALCIUM ION: 1.08 mmol/L — AB (ref 1.15–1.40)
CREATININE: 0.7 mg/dL (ref 0.44–1.00)
Chloride: 102 mmol/L (ref 101–111)
Glucose, Bld: 116 mg/dL — ABNORMAL HIGH (ref 65–99)
HCT: 45 % (ref 36.0–46.0)
Hemoglobin: 15.3 g/dL — ABNORMAL HIGH (ref 12.0–15.0)
Potassium: 3.8 mmol/L (ref 3.5–5.1)
Sodium: 139 mmol/L (ref 135–145)
TCO2: 30 mmol/L (ref 0–100)

## 2016-08-30 LAB — TROPONIN I
Troponin I: <0.03 ng/mL (ref <0.03)
Troponin I: <0.03 ng/mL (ref <0.03)

## 2016-08-30 LAB — ECHOCARDIOGRAM COMPLETE
Height: 69 in
WEIGHTICAEL: 3910.4 [oz_av]

## 2016-08-30 MED ORDER — NITROGLYCERIN 0.4 MG SL SUBL
0.4000 mg | SUBLINGUAL_TABLET | SUBLINGUAL | Status: DC | PRN
  Filled 2016-08-30: qty 1

## 2016-08-30 MED ORDER — IOPAMIDOL (ISOVUE-370) INJECTION 76%
100.0000 mL | Freq: Once | INTRAVENOUS | Status: AC | PRN
  Administered 2016-08-30: 100 mL via INTRAVENOUS

## 2016-08-30 MED ORDER — ONDANSETRON HCL 4 MG/2ML IJ SOLN: 4.0000 mg | Freq: Four times a day (QID) | INTRAMUSCULAR | Status: DC | PRN

## 2016-08-30 MED ORDER — PERFLUTREN LIPID MICROSPHERE
1.0000 mL | INTRAVENOUS | Status: AC | PRN
  Administered 2016-08-30: 1 mL via INTRAVENOUS
  Administered 2016-08-30: 2 mL via INTRAVENOUS
  Filled 2016-08-30: qty 10

## 2016-08-30 MED ORDER — MORPHINE SULFATE (PF) 2 MG/ML IV SOLN: 2.0000 mg | INTRAVENOUS | Status: DC | PRN

## 2016-08-30 MED ORDER — ACETAMINOPHEN 325 MG PO TABS: 650.0000 mg | ORAL_TABLET | ORAL | Status: DC | PRN

## 2016-08-30 MED ORDER — ASPIRIN EC 325 MG PO TBEC
325.0000 mg | DELAYED_RELEASE_TABLET | Freq: Every day | ORAL | Status: DC
  Administered 2016-08-30: 325 mg via ORAL
  Filled 2016-08-30 (×2): qty 1

## 2016-08-30 MED ORDER — PANTOPRAZOLE SODIUM 40 MG PO TBEC
40.0000 mg | DELAYED_RELEASE_TABLET | Freq: Every day | ORAL | Status: DC
  Administered 2016-08-30: 40 mg via ORAL
  Filled 2016-08-30 (×2): qty 1

## 2016-08-30 MED ORDER — REGADENOSON 0.4 MG/5ML IV SOLN
0.4000 mg | Freq: Once | INTRAVENOUS | Status: AC
  Administered 2016-08-31: 0.4 mg via INTRAVENOUS
  Filled 2016-08-30: qty 5

## 2016-08-30 NOTE — Care Management Note (Signed)
Case Management Note  Patient Details  Name: [name] MRN: [id number] Date of Birth: 08-14-1933  Subjective/Objective: Patient adm from home alone with CP. She uses a WC and is ind with ADL's. She uses [facility] for transportation. She has a PCP, transportation, and insurance with drug coverage.                   Action/Plan: Anticipate DC with self care.    Expected Discharge Date:     08/30/2016          Expected Discharge Plan:  Home/Self Care  In-House Referral:  NA  Discharge planning Services  CM Consult  Post Acute Care Choice:  NA Choice offered to:  NA  DME Arranged:    DME Agency:     HH Arranged:    HH Agency:     Status of Service:  In process, will continue to follow  If discussed at Long Length of Stay Meetings, dates discussed:    Additional Comments:  Tacie Mccuistion, Chrystine OilerSharley Diane, RN 08/30/2016, 9:00 AM

## 2016-08-30 NOTE — Progress Notes (Signed)
*  PRELIMINARY RESULTS* Echocardiogram 2D Echocardiogram has been performed.  Stacey DrainWhite, Dymphna Wadley J 08/30/2016, 3:18 PM

## 2016-08-30 NOTE — Care Management Obs Status (Signed)
MEDICARE OBSERVATION STATUS NOTIFICATION   Patient Details  Name: [name] MRN: [id number] Date of Birth: 12/25/1932   Medicare Observation Status Notification Given:  Yes    Malone Admire, Chrystine OilerSharley Diane, RN 08/30/2016, 9:09 AM

## 2016-08-30 NOTE — Progress Notes (Signed)
This is an assumption of care note. She was admitted early this morning with chest pain. She's not had chest pain before. She has no other new complaints. She has had some problems she says with feeling like she has trouble getting some of her medications down in the past. I had scheduled her an appointment with GI but she did not follow through. However her pain did totally resolve with nitroglycerin. This could of course be esophageal pain that resolved with nitroglycerin or cardiac pain so I told her I think we need to get cardiology evaluation. She has a systolic heart murmur that I don't believe I have heard before but I have not seen her in my office in about a year. She is scheduled for echocardiogram. Plan then is for echocardiogram cardiology consultation further evaluation after the above

## 2016-08-30 NOTE — ED Provider Notes (Signed)
[facility] Provider Note   CSN: [id number] Arrival date & time:     By signing my name below, I, Vista Minkobert Ross, attest that this documentation has been prepared under the direction and in the presence of Glynn OctaveStephen Lala Been, MD. Electronically signed, Vista Minkobert Ross, ED Scribe. 08/30/16. 1:12 AM.  History   Chief Complaint Chief Complaint  Patient presents with  . Chest Pain    HPI HPI Comments: [name] is a 82 y.o. female with hx of HLD, HTN, Hypothyroidism, brought in by ambulance, who presents to the Emergency Department complaining of sudden onset, constant, central chest pain that radiates between her shoulder blades that started approximately three hours ago. Pt's pain started as a 10/10 but has reduced to a 2/10 currently. Pt took 4 ASA given by EMS which she states relived her pain. She also took 3 ibuprofen prior to arrival. Her chest pain is now radiating to her abdomen. She has had similar chest pain before when taking Vesicare, but did not have radiating pain to her back or abdomen during these episodes. No exacerbating or alleviating factors. She denies any Hx of MI, stents. She further denies any shortness of breath or diaphoresis. No numbness, tingling, headache. Dr. Juanetta GoslingHawkins is her PCP.  The history is provided by the patient and the EMS personnel. No language interpreter was used.    Past Medical History:  Diagnosis Date  . Anxiety   . Arthritis   . Blood transfusion   . Chronic bronchitis   . Constipation   . Depression   . Hyperlipidemia   . Hypothyroidism   . Thyroid disease     Patient Active Problem List   Diagnosis Date Noted  . S/P knee replacement 09/06/2012  . S/P reconstruction of ligament of knee 11/24/2011  . Patellar tendon rupture 06/04/2011  . S/P total knee replacement 05/13/2011  . KNEE, ARTHRITIS, DEGEN./OSTEO 05/14/2010    Past Surgical History:  Procedure Laterality Date  . EYE SURGERY  2009   bilateral  . HEMATOMA  EVACUATION  06/04/2011   Procedure: EVACUATION HEMATOMA;  Surgeon: Fuller CanadaStanley Harrison, MD;  Location: [facility];  Service: Orthopedics;  Laterality: Right;  evacuation of seroma  . JOINT REPLACEMENT  05/2010   left total knee Dr. Romeo AppleHarrison  . [facility]    . left total knee    . PATELLAR TENDON REPAIR  06/04/2011   Procedure: PATELLA TENDON REPAIR;  Surgeon: Fuller CanadaStanley Harrison, MD;  Location: [facility];  Service: Orthopedics;  Laterality: Right;  . PATELLAR TENDON REPAIR  08/30/2011   Procedure: PATELLA TENDON REPAIR;  Surgeon: Fuller CanadaStanley Harrison, MD;  Location: [facility];  Service: Orthopedics;  Laterality: Right;  Allograft Reconstrucation Right Patella Tendon  . REPLACEMENT TOTAL KNEE BILATERAL    . TOTAL HIP ARTHROPLASTY    . TOTAL KNEE ARTHROPLASTY  05/03/2011   Procedure: TOTAL KNEE ARTHROPLASTY;  Surgeon: Fuller CanadaStanley Harrison, MD;  Location: [facility];  Service: Orthopedics;  Laterality: Right;  With DePuy  . TOTAL THYROIDECTOMY  March 2011   [facility]  . TUBAL LIGATION      OB History    No data available       Home Medications    Prior to Admission medications   Medication Sig Start Date End Date Taking? Authorizing Provider  ALPRAZolam Prudy Feeler(XANAX) 0.25 MG tablet Take 0.5 mg by mouth daily as needed for anxiety. For anxiety    Historical Provider, MD  aspirin EC 81 MG tablet Take  81 mg by mouth daily.    Historical Provider, MD  furosemide (LASIX) 40 MG tablet Take 40 mg by mouth daily.      Historical Provider, MD  HYDROcodone-acetaminophen (NORCO) 10-325 MG per tablet Take 1 tablet by mouth every 6 (six) hours as needed. 04/26/14   Ivery Quale, PA-C  HYDROcodone-acetaminophen (NORCO) 10-325 MG per tablet Take 1 tablet by mouth every 8 (eight) hours as needed. 04/29/14   Vickki Hearing, MD  ibuprofen (ADVIL,MOTRIN) 200 MG tablet Take 600 mg by mouth daily as needed for moderate pain.    Historical Provider, MD  levothyroxine (SYNTHROID, LEVOTHROID) 150 MCG tablet Take 150 mcg  by mouth every morning.      Historical Provider, MD  PARoxetine (PAXIL) 20 MG tablet Take 20 mg by mouth every evening.      Historical Provider, MD    Family History Family History  Problem Relation Age of Onset  . Anesthesia problems Neg Hx   . Hypotension Neg Hx   . Malignant hyperthermia Neg Hx   . Pseudochol deficiency Neg Hx     Social History Social History  Substance Use Topics  . Smoking status: Never Smoker  . Smokeless tobacco: Never Used  . Alcohol use No    Allergies   Patient has no known allergies.   Review of Systems Review of Systems A complete 10 system review of systems was obtained and all systems are negative except as noted in the HPI and PMH.    Physical Exam Updated Vital Signs Ht 5\' 10"  (1.778 m)   Wt 220 lb (99.8 kg)   BMI 31.57 kg/m   Physical Exam  Constitutional: She is oriented to person, place, and time. She appears well-developed and well-nourished. No distress.  Comfortable  HENT:  Head: Normocephalic and atraumatic.  Mouth/Throat: Oropharynx is clear and moist. No oropharyngeal exudate.  Eyes: Conjunctivae and EOM are normal. Pupils are equal, round, and reactive to light.  Neck: Normal range of motion. Neck supple.  No meningismus.  Cardiovascular: Normal rate, regular rhythm and intact distal pulses.   Murmur heard. Chest non tender. 3/6 systolic murmur. Equal radial and DP pulses.  Pulmonary/Chest: Effort normal and breath sounds normal. No respiratory distress. She exhibits no tenderness.  Abdominal: Soft. There is no tenderness. There is no rebound and no guarding.  Musculoskeletal: Normal range of motion. She exhibits no edema or tenderness.  Neurological: She is alert and oriented to person, place, and time. No cranial nerve deficit. She exhibits normal muscle tone. Coordination normal.  No ataxia on finger to nose bilaterally. No pronator drift. 5/5 strength throughout. CN 2-12 intact.Equal grip strength. Sensation  intact.   Skin: Skin is warm.  Psychiatric: She has a normal mood and affect. Her behavior is normal.  Nursing note and vitals reviewed.    ED Treatments / Results  DIAGNOSTIC STUDIES: Oxygen Saturation is 93% on RA, adequate by my interpretation.  COORDINATION OF CARE: 1:09 AM-Discussed treatment plan with pt at bedside and pt agreed to plan.   Labs (all labs ordered are listed, but only abnormal results are displayed) Labs Reviewed  CBC WITH DIFFERENTIAL/PLATELET - Abnormal; Notable for the following:       Result Value   RBC 5.16 (*)    All other components within normal limits  BASIC METABOLIC PANEL - Abnormal; Notable for the following:    Glucose, Bld 120 (*)    Calcium 8.6 (*)    All other components within normal limits  HEPATIC FUNCTION PANEL - Abnormal; Notable for the following:    ALT 10 (*)    All other components within normal limits  I-STAT CHEM 8, ED - Abnormal; Notable for the following:    Glucose, Bld 116 (*)    Calcium, Ion 1.08 (*)    Hemoglobin 15.3 (*)    All other components within normal limits  TROPONIN I  LIPASE, BLOOD  I-STAT TROPOININ, ED    EKG  EKG Interpretation  Date/Time:  Monday August 30 2016 01:07:22 EST Ventricular Rate:  60 PR Interval:    QRS Duration: 119 QT Interval:  474 QTC Calculation: 474 R Axis:   -34 Text Interpretation:  Sinus rhythm Left ventricular hypertrophy No significant change was found Confirmed by Manus Gunning  MD, Merranda Bolls [id number]) on 08/30/2016 1:13:39 AM       Radiology Dg Chest Portable 1 View  Result Date: 08/30/2016 CLINICAL DATA:  Sudden onset of central chest pain radiating to the interscapular region, 3 hours duration EXAM: PORTABLE CHEST 1 VIEW COMPARISON:  04/06/2011 FINDINGS: Unchanged cardiomegaly. Unchanged linear left base scarring. The lungs are otherwise clear. The pulmonary vasculature is normal. Hilar and mediastinal contours are unremarkable and unchanged. IMPRESSION: Stable cardiomegaly.   No acute findings. Electronically Signed   By: Ellery Plunk M.D.   On: 08/30/2016 01:49   Ct Angio Chest/abd/pel For Dissection W And/or Wo Contrast  Result Date: 08/30/2016 CLINICAL DATA:  Acute onset of central chest pain, radiating between the scapulae and to the abdomen. Initial encounter. EXAM: CT ANGIOGRAPHY CHEST, ABDOMEN AND PELVIS TECHNIQUE: Multidetector CT imaging through the chest, abdomen and pelvis was performed using the standard protocol during bolus administration of intravenous contrast. Multiplanar reconstructed images and MIPs were obtained and reviewed to evaluate the vascular anatomy. CONTRAST:  100 mL of Isovue 370 IV contrast COMPARISON:  Chest radiograph performed earlier today at 1:17 a.m. FINDINGS: CTA CHEST FINDINGS Cardiovascular: There is no evidence of aortic dissection. There is no evidence of aneurysmal dilatation. Calcification is seen at the aortic valve. Minimal calcification is noted along the aortic arch and descending thoracic aorta. Minimal mural thrombus is seen along the descending thoracic aorta. The great vessels are grossly unremarkable in appearance. There is no evidence of significant pulmonary embolus. Mediastinum/Nodes: The mediastinum is unremarkable in appearance, aside from a moderate hiatal hernia. No mediastinal lymphadenopathy is seen. No pericardial effusion is identified. The thyroid gland is not definitely characterized. No axillary lymphadenopathy is seen. Lungs/Pleura: An accessory azygos lobe is noted. Minimal left basilar atelectasis is noted. The lungs are otherwise clear. No pleural effusion or pneumothorax is seen. No masses are identified. Musculoskeletal: No acute osseous abnormalities are identified. The visualized musculature is unremarkable in appearance. Review of the MIP images confirms the above findings. CTA ABDOMEN AND PELVIS FINDINGS VASCULAR Aorta: Scattered calcification is noted along the abdominal aorta and its branches. No  significant luminal narrowing is seen. Celiac: There is minimal narrowing of the proximal celiac trunk, with mild calcification. SMA: The superior mesenteric artery is fully patent, with mild calcification at the origin of the superior mesenteric artery. Renals: The renal arteries appear patent bilaterally. IMA: The inferior mesenteric artery appears patent. Inflow: Mild calcification is noted along the common, external and internal iliac arteries bilaterally. The common femoral arteries appear fully patent bilaterally. Veins: The inferior vena cava is grossly unremarkable in appearance. The visualized venous structures are grossly unremarkable. Review of the MIP images confirms the above findings. NON-VASCULAR Hepatobiliary: A 1.2 cm hypodensity within  the posterior right hepatic lobe is nonspecific. The liver is otherwise unremarkable. Stones are noted dependently within the gallbladder. There is suggestion of mild gallbladder wall thickening, with trace surrounding inflammation, raising question for acute cholecystitis. The common bile duct remains normal in caliber. Pancreas: The pancreas is within normal limits. Spleen: The spleen is unremarkable in appearance. Adrenals/Urinary Tract: The adrenal glands are unremarkable in appearance. The kidneys are within normal limits. There is no evidence of hydronephrosis. No renal or ureteral stones are identified. No perinephric stranding is seen. Stomach/Bowel: The stomach is unremarkable in appearance. The small bowel is within normal limits. The appendix is normal in caliber, without evidence of appendicitis. The colon is unremarkable in appearance. Lymphatic: No retroperitoneal or pelvic sidewall lymphadenopathy is seen. Reproductive: The bladder is moderately distended and grossly unremarkable. The uterus is unremarkable in appearance. The ovaries are not well characterized. No suspicious adnexal masses are seen. Other: No additional soft tissue abnormalities are  seen. Musculoskeletal: No acute osseous abnormalities are identified. Multilevel vacuum phenomenon is noted along the lumbar spine. [name] left hip arthroplasty is incompletely imaged, but appears grossly unremarkable. The visualized musculature is unremarkable in appearance. Review of the MIP images confirms the above findings. IMPRESSION: 1. No evidence of aortic dissection. No evidence of aneurysmal dilatation. Calcification at the aortic valve. Scattered aortic atherosclerosis noted. 2. No evidence of significant pulmonary embolus. 3. Cholelithiasis. Suggestion of mild gallbladder wall thickening, with trace surrounding inflammation, raising question for acute cholecystitis. Would correlate with the patient's symptoms. 4. Moderate hiatal hernia noted. 5. Accessory azygos lobe noted. Minimal left basilar atelectasis seen. Lungs otherwise clear. 6. Nonspecific 1.2 cm hypodensity within the posterior right hepatic lobe. Would correlate with LFTs. 7. Mild degenerative change along the lumbar spine. Electronically Signed   By: Roanna RaiderJeffery  Chang M.D.   On: 08/30/2016 02:09    Procedures Procedures (including critical care time)  Medications Ordered in ED Medications - No data to display   Initial Impression / Assessment and Plan / ED Course  I have reviewed the triage vital signs and the nursing notes.  Pertinent labs & imaging results that were available during my care of the patient were reviewed by me and considered in my medical decision making (see chart for details).  Clinical Course   Patient presents with left-sided chest pain radiating to the upper back as well as to the abdomen. The pain has been constant since 10 PM. She's had this before but never this severe. EKG shows no acute ST changes. Patient has equal pulses and equal upper extremity blood pressures.  However patient does have chest pain, upper back pain with elevated blood pressure and new heart murmur by her report. We'll  evaluate for aortic dissection.  Troponin is negative. CT scan shows no evidence of aortic dissection. There is some concern for possible cholecystitis. Patient has normal white blood cell count, LFTs and lipase. On Recheck her pain is resolved after one NTG.  BP 175/64. She has no right upper quadrant or epigastric pain. Clinically she does not have cholecystitis, but may benefit from US in AM given abnormal CT findings.  Observation admission for chest pain rule out d/w Dr. Onalee Huaavid.   Final Clinical Impressions(s) / ED Diagnoses   Final diagnoses:  Chest pain    New Prescriptions New Prescriptions   No medications on file  I personally performed the services described in this documentation, which was scribed in my presence. The recorded information has been reviewed and is  accurate.     Glynn Octave, MD 08/30/16 [phone]

## 2016-08-30 NOTE — H&P (Signed)
History and Physical    [name]:[id number] DOB: [birth year] DOA: 08/30/2016  PCP: Fredirick Maudlin, MD  Patient coming from: home  Chief Complaint:  Chest pain  HPI: [name] is a 82 y.o. female with medical history significant of AAA, COPD, HTN comes in with sscp that radiated to her left shoulder and down into her periumbilical area with no n/v and relieved with ntg sl in the ED.  Pt denies any fevers.  No diarrhea.  No sob.  Pt denies any pain at this time, has been pain free since getting the ntg.  The onset was sudden.  Pt referred for admission for possible ACS.  Review of Systems: As per HPI otherwise 10 point review of systems negative.   Past Medical History:  Diagnosis Date  . Anxiety   . Arthritis   . Blood transfusion   . Chronic bronchitis   . Constipation   . Depression   . Hyperlipidemia   . Hypothyroidism   . Thyroid disease     Past Surgical History:  Procedure Laterality Date  . EYE SURGERY  2009   bilateral  . HEMATOMA EVACUATION  06/04/2011   Procedure: EVACUATION HEMATOMA;  Surgeon: Fuller Canada, MD;  Location: [facility];  Service: Orthopedics;  Laterality: Right;  evacuation of seroma  . JOINT REPLACEMENT  05/2010   left total knee Dr. Romeo Apple  . left hip replacement @ [facility] '98    . left total knee    . PATELLAR TENDON REPAIR  06/04/2011   Procedure: PATELLA TENDON REPAIR;  Surgeon: Fuller Canada, MD;  Location: [facility];  Service: Orthopedics;  Laterality: Right;  . PATELLAR TENDON REPAIR  08/30/2011   Procedure: PATELLA TENDON REPAIR;  Surgeon: Fuller Canada, MD;  Location: [facility];  Service: Orthopedics;  Laterality: Right;  Allograft Reconstrucation Right Patella Tendon  . REPLACEMENT TOTAL KNEE BILATERAL    . TOTAL HIP ARTHROPLASTY    . TOTAL KNEE ARTHROPLASTY  05/03/2011   Procedure: TOTAL KNEE ARTHROPLASTY;  Surgeon: Fuller Canada, MD;  Location: [facility];  Service: Orthopedics;  Laterality: Right;  With DePuy  . TOTAL  THYROIDECTOMY  March 2011   Dr. Suszanne Conners @ [facility]  . TUBAL LIGATION       reports that she has never smoked. She has never used smokeless tobacco. She reports that she does not drink alcohol or use drugs.  No Known Allergies  Family History  Problem Relation Age of Onset  . Anesthesia problems Neg Hx   . Hypotension Neg Hx   . Malignant hyperthermia Neg Hx   . Pseudochol deficiency Neg Hx     Prior to Admission medications   Medication Sig Start Date End Date Taking? Authorizing Provider  aspirin EC 81 MG tablet Take 81 mg by mouth daily.   Yes Historical Provider, MD  ibuprofen (ADVIL,MOTRIN) 200 MG tablet Take 600 mg by mouth daily as needed for moderate pain.   Yes Historical Provider, MD  levothyroxine (SYNTHROID, LEVOTHROID) 150 MCG tablet Take 150 mcg by mouth every morning.     Yes Historical Provider, MD  omeprazole (PRILOSEC) 20 MG capsule Take 20 mg by mouth daily.   Yes Historical Provider, MD  PARoxetine (PAXIL) 20 MG tablet Take 20 mg by mouth every evening.     Yes Historical Provider, MD  solifenacin (VESICARE) 10 MG tablet Take 10 mg by mouth daily.   Yes Historical Provider, MD  ALPRAZolam Prudy Feeler) 0.25 MG tablet Take 0.5 mg by mouth daily  as needed for anxiety. For anxiety    Historical Provider, MD  furosemide (LASIX) 40 MG tablet Take 40 mg by mouth daily.      Historical Provider, MD  HYDROcodone-acetaminophen (NORCO) 10-325 MG per tablet Take 1 tablet by mouth every 6 (six) hours as needed. 04/26/14   Ivery Quale, PA-C  HYDROcodone-acetaminophen (NORCO) 10-325 MG per tablet Take 1 tablet by mouth every 8 (eight) hours as needed. 04/29/14   Vickki Hearing, MD    Physical Exam: Vitals:   08/30/16 0125 08/30/16 0215 08/30/16 0230 08/30/16 0245  BP:  175/64 171/59 175/64  Pulse: 67 67 64 66  Resp: 18 16 17 16   Temp:      TempSrc:      SpO2: 93% 95% 96% 94%  Weight:      Height:        Constitutional: NAD, calm, comfortable Vitals:   08/30/16 0125 08/30/16  0215 08/30/16 0230 08/30/16 0245  BP:  175/64 171/59 175/64  Pulse: 67 67 64 66  Resp: 18 16 17 16   Temp:      TempSrc:      SpO2: 93% 95% 96% 94%  Weight:      Height:       Eyes: PERRL, lids and conjunctivae normal ENMT: Mucous membranes are moist. Posterior pharynx clear of any exudate or lesions.Normal dentition.  Neck: normal, supple, no masses, no thyromegaly Respiratory: clear to auscultation bilaterally, no wheezing, no crackles. Normal respiratory effort. No accessory muscle use.  Cardiovascular: Regular rate and rhythm, no murmurs / rubs / gallops. No extremity edema. 2+ pedal pulses. No carotid bruits.  Abdomen: no tenderness, no masses palpated. No hepatosplenomegaly. Bowel sounds positive.  Musculoskeletal: no clubbing / cyanosis. No joint deformity upper and lower extremities. Good ROM, no contractures. Normal muscle tone.  Skin: no rashes, lesions, ulcers. No induration Neurologic: CN 2-12 grossly intact. Sensation intact, DTR normal. Strength 5/5 in all 4.  Psychiatric: Normal judgment and insight. Alert and oriented x 3. Normal mood.    Labs on Admission: I have personally reviewed following labs and imaging studies  CBC:  Recent Labs Lab 08/30/16 0111 08/30/16 0122  WBC 10.2  --   NEUTROABS 6.7  --   HGB 14.5 15.3*  HCT 44.5 45.0  MCV 86.2  --   PLT 254  --    Basic Metabolic Panel:  Recent Labs Lab 08/30/16 0111 08/30/16 0122  NA 136 139  K 3.6 3.8  CL 101 102  CO2 27  --   GLUCOSE 120* 116*  BUN 15 18  CREATININE 0.69 0.70  CALCIUM 8.6*  --    GFR: Estimated Creatinine Clearance: 70.5 mL/min (by C-G formula based on SCr of 0.7 mg/dL). Liver Function Tests:  Recent Labs Lab 08/30/16 0111  AST 17  ALT 10*  ALKPHOS 72  BILITOT 0.4  PROT 6.8  ALBUMIN 3.8    Recent Labs Lab 08/30/16 0111  LIPASE 27   Cardiac Enzymes:  Recent Labs Lab 08/30/16 0111  TROPONINI <0.03   Urine analysis:    Component Value Date/Time    COLORURINE YELLOW 02/05/2014 1511   APPEARANCEUR CLEAR 02/05/2014 1511   LABSPEC 1.020 02/05/2014 1511   PHURINE 5.5 02/05/2014 1511   GLUCOSEU NEGATIVE 02/05/2014 1511   HGBUR NEGATIVE 02/05/2014 1511   BILIRUBINUR NEGATIVE 02/05/2014 1511   KETONESUR NEGATIVE 02/05/2014 1511   PROTEINUR NEGATIVE 02/05/2014 1511   UROBILINOGEN 0.2 02/05/2014 1511   NITRITE NEGATIVE 02/05/2014 1511   LEUKOCYTESUR  NEGATIVE 02/05/2014 1511     Radiological Exams on Admission: Dg Chest Portable 1 View  Result Date: 08/30/2016 CLINICAL DATA:  Sudden onset of central chest pain radiating to the interscapular region, 3 hours duration EXAM: PORTABLE CHEST 1 VIEW COMPARISON:  04/06/2011 FINDINGS: Unchanged cardiomegaly. Unchanged linear left base scarring. The lungs are otherwise clear. The pulmonary vasculature is normal. Hilar and mediastinal contours are unremarkable and unchanged. IMPRESSION: Stable cardiomegaly.  No acute findings. Electronically Signed   By: Ellery Plunkaniel R Mitchell M.D.   On: 08/30/2016 01:49   Ct Angio Chest/abd/pel For Dissection W And/or Wo Contrast  Result Date: 08/30/2016 CLINICAL DATA:  Acute onset of central chest pain, radiating between the scapulae and to the abdomen. Initial encounter. EXAM: CT ANGIOGRAPHY CHEST, ABDOMEN AND PELVIS TECHNIQUE: Multidetector CT imaging through the chest, abdomen and pelvis was performed using the standard protocol during bolus administration of intravenous contrast. Multiplanar reconstructed images and MIPs were obtained and reviewed to evaluate the vascular anatomy. CONTRAST:  100 mL of Isovue 370 IV contrast COMPARISON:  Chest radiograph performed earlier today at 1:17 a.m. FINDINGS: CTA CHEST FINDINGS Cardiovascular: There is no evidence of aortic dissection. There is no evidence of aneurysmal dilatation. Calcification is seen at the aortic valve. Minimal calcification is noted along the aortic arch and descending thoracic aorta. Minimal mural thrombus is  seen along the descending thoracic aorta. The great vessels are grossly unremarkable in appearance. There is no evidence of significant pulmonary embolus. Mediastinum/Nodes: The mediastinum is unremarkable in appearance, aside from a moderate hiatal hernia. No mediastinal lymphadenopathy is seen. No pericardial effusion is identified. The thyroid gland is not definitely characterized. No axillary lymphadenopathy is seen. Lungs/Pleura: An accessory azygos lobe is noted. Minimal left basilar atelectasis is noted. The lungs are otherwise clear. No pleural effusion or pneumothorax is seen. No masses are identified. Musculoskeletal: No acute osseous abnormalities are identified. The visualized musculature is unremarkable in appearance. Review of the MIP images confirms the above findings. CTA ABDOMEN AND PELVIS FINDINGS VASCULAR Aorta: Scattered calcification is noted along the abdominal aorta and its branches. No significant luminal narrowing is seen. Celiac: There is minimal narrowing of the proximal celiac trunk, with mild calcification. SMA: The superior mesenteric artery is fully patent, with mild calcification at the origin of the superior mesenteric artery. Renals: The renal arteries appear patent bilaterally. IMA: The inferior mesenteric artery appears patent. Inflow: Mild calcification is noted along the common, external and internal iliac arteries bilaterally. The common femoral arteries appear fully patent bilaterally. Veins: The inferior vena cava is grossly unremarkable in appearance. The visualized venous structures are grossly unremarkable. Review of the MIP images confirms the above findings. NON-VASCULAR Hepatobiliary: A 1.2 cm hypodensity within the posterior right hepatic lobe is nonspecific. The liver is otherwise unremarkable. Stones are noted dependently within the gallbladder. There is suggestion of mild gallbladder wall thickening, with trace surrounding inflammation, raising question for acute  cholecystitis. The common bile duct remains normal in caliber. Pancreas: The pancreas is within normal limits. Spleen: The spleen is unremarkable in appearance. Adrenals/Urinary Tract: The adrenal glands are unremarkable in appearance. The kidneys are within normal limits. There is no evidence of hydronephrosis. No renal or ureteral stones are identified. No perinephric stranding is seen. Stomach/Bowel: The stomach is unremarkable in appearance. The small bowel is within normal limits. The appendix is normal in caliber, without evidence of appendicitis. The colon is unremarkable in appearance. Lymphatic: No retroperitoneal or pelvic sidewall lymphadenopathy is seen. Reproductive: The bladder is  moderately distended and grossly unremarkable. The uterus is unremarkable in appearance. The ovaries are not well characterized. No suspicious adnexal masses are seen. Other: No additional soft tissue abnormalities are seen. Musculoskeletal: No acute osseous abnormalities are identified. Multilevel vacuum phenomenon is noted along the lumbar spine. [name] left hip arthroplasty is incompletely imaged, but appears grossly unremarkable. The visualized musculature is unremarkable in appearance. Review of the MIP images confirms the above findings. IMPRESSION: 1. No evidence of aortic dissection. No evidence of aneurysmal dilatation. Calcification at the aortic valve. Scattered aortic atherosclerosis noted. 2. No evidence of significant pulmonary embolus. 3. Cholelithiasis. Suggestion of mild gallbladder wall thickening, with trace surrounding inflammation, raising question for acute cholecystitis. Would correlate with the patient's symptoms. 4. Moderate hiatal hernia noted. 5. Accessory azygos lobe noted. Minimal left basilar atelectasis seen. Lungs otherwise clear. 6. Nonspecific 1.2 cm hypodensity within the posterior right hepatic lobe. Would correlate with LFTs. 7. Mild degenerative change along the lumbar spine.  Electronically Signed   By: Roanna RaiderJeffery  Chang M.D.   On: 08/30/2016 02:09    EKG: Independently reviewed.  nsr no acute issues  Assessment/Plan 82 yo female with atypical chest pain with multiple risk factors  Principal Problem:   Chest pain- ct was done to r/o dissection due to her h/o aneurysm, which was neg.  It did show some possible gallbladder disease.  Pt pain was vague, and not in ruq but could still be GB disease.  Will do po challenge and see how she tolerates diet.  If symptoms were to redevelop consider further surgical work up.  Echo in the am.  Serial trop overnight.  Active Problems:  Stable unless o/w noted   Hypothyroidism   Hyperlipidemia   HTN (hypertension)  Clarify home meds  DVT prophylaxis:  scds Code Status:  full Family Communication:  none Disposition Plan:  Per day team Consults called: none  Admission status:  observation   Larnie Heart A MD [facility]  If 7PM-7AM, please contact night-coverage [email or web address] Password [email or web address]  08/30/2016, 3:02 AM

## 2016-08-30 NOTE — Consult Note (Signed)
Requesting provider: Dr. Kari BaarsEdward Chen Consulting cardiologist: Dr. Jonelle SidleSamuel G. Chen  Reason for consultation: Chest pain  Clinical Summary Ms. [name] is an 82 y.o.female with past medical history outlined below, currently admitted to the hospital complaining of lower thoracic and abdominal discomfort. She states that yesterday at rest without obvious provocation she began to experience a feeling of "achiness" in her mid to lower substernal area followed by discomfort in her abdomen down to her waistline going from front to back. She has not experienced this before. Symptoms lasted for a few hours, also associated with mild nausea but no emesis, no recent changes in stools. She was seen in the ER, received aspirin and nitroglycerin, ultimately symptoms resolved.  Under observation she has had no recurrence. Cardiac markers are normal and argue against ACS, ECG with LVH. CTA of the chest, abdomen, and pelvis shows calcification of the aorta without obvious dissection or aneurysm, no pulmonary embolus, cholelithiasis with mild gallbladder wall thickening and trace surrounding inflammation, moderate-sized hiatal hernia. LFTs are normal.  Blood pressure was elevated at presentation. She does not report any recurring exertional chest discomfort. No definite symptoms with particular foods except difficulty tolerating rice, causes her to have emesis.  No Known Allergies  Medications Scheduled Medications: . aspirin EC  325 mg Oral Daily  . pantoprazole  40 mg Oral Daily    PRN Medications: acetaminophen, morphine injection, ondansetron (ZOFRAN) IV, perflutren lipid microspheres (DEFINITY) IV suspension   Past Medical History:  Diagnosis Date  . Anxiety   . Arthritis   . Blood transfusion   . Chronic bronchitis   . Constipation   . Depression   . Hyperlipidemia   . Hypothyroidism     Past Surgical History:  Procedure Laterality Date  . EYE SURGERY  2009   bilateral  .  HEMATOMA EVACUATION  06/04/2011   Procedure: EVACUATION HEMATOMA;  Surgeon: Fuller CanadaStanley Harrison, MD;  Location: [facility];  Service: Orthopedics;  Laterality: Right;  evacuation of seroma  . JOINT REPLACEMENT  05/2010   left total knee Dr. Romeo AppleHarrison  . [facility]    . Left total knee    . PATELLAR TENDON REPAIR  06/04/2011   Procedure: PATELLA TENDON REPAIR;  Surgeon: Fuller CanadaStanley Harrison, MD;  Location: [facility];  Service: Orthopedics;  Laterality: Right;  . PATELLAR TENDON REPAIR  08/30/2011   Procedure: PATELLA TENDON REPAIR;  Surgeon: Fuller CanadaStanley Harrison, MD;  Location: [facility];  Service: Orthopedics;  Laterality: Right;  Allograft Reconstrucation Right Patella Tendon  . REPLACEMENT TOTAL KNEE BILATERAL    . TOTAL HIP ARTHROPLASTY    . TOTAL KNEE ARTHROPLASTY  05/03/2011   Procedure: TOTAL KNEE ARTHROPLASTY;  Surgeon: Fuller CanadaStanley Harrison, MD;  Location: [facility];  Service: Orthopedics;  Laterality: Right;  With DePuy  . TOTAL THYROIDECTOMY  March 2011   [facility]oh [facility]  . TUBAL LIGATION      Family History  Problem Relation Age of Onset  . Colon cancer Mother   . Anesthesia problems Neg Hx   . Hypotension Neg Hx   . Malignant hyperthermia Neg Hx   . Pseudochol deficiency Neg Hx     Social History Ms. [name] reports that she has never smoked. She has never used smokeless tobacco. Ms. [name] reports that she does not drink alcohol.  Review of Systems Complete review of systems negative except as otherwise outlined in the clinical summary and also the following.  Physical Examination Blood pressure (!) 193/66, pulse  67, temperature 98.2 F (36.8 C), temperature source Oral, resp. rate 18, height 5\' 9"  (1.753 m), weight 244 lb 6.4 oz (110.9 kg), SpO2 96 %. No intake or output data in the 24 hours ending 08/30/16 1524  Telemetry: Sinus rhythm.  Obese woman, no distress. HEENT: Conjunctiva and lids normal, oropharynx clear. Neck: Supple, no elevated JVP or carotid bruits, no  thyromegaly. Lungs: Clear to auscultation, nonlabored breathing at rest. Cardiac: Regular rate and rhythm, no S3, 2-3/6 systolic murmur, no pericardial rub. Abdomen: Soft, nontender, bowel sounds present, no guarding or rebound. Extremities: Mild ankle edema, distal pulses 2+. Skin: Warm and dry. Musculoskeletal: No kyphosis. Neuropsychiatric: Alert and oriented x3, affect grossly appropriate.  Lab Results  Basic Metabolic Panel:  Recent Labs Lab 08/30/16 0111 08/30/16 0122  NA 136 139  K 3.6 3.8  CL 101 102  CO2 27  --   GLUCOSE 120* 116*  BUN 15 18  CREATININE 0.69 0.70  CALCIUM 8.6*  --     Liver Function Tests:  Recent Labs Lab 08/30/16 0111  AST 17  ALT 10*  ALKPHOS 72  BILITOT 0.4  PROT 6.8  ALBUMIN 3.8    CBC:  Recent Labs Lab 08/30/16 0111 08/30/16 0122  WBC 10.2  --   NEUTROABS 6.7  --   HGB 14.5 15.3*  HCT 44.5 45.0  MCV 86.2  --   PLT 254  --     Cardiac Enzymes:  Recent Labs Lab 08/30/16 0111 08/30/16 0333 08/30/16 0557 08/30/16 0940  TROPONINI <0.03 <0.03 <0.03 <0.03    ECG I personally reviewed the tracing from 08/30/2016 which shows sinus rhythm with increased voltage.  Imaging  CTA chest, abdomen, and pelvis 08/30/2016: IMPRESSION: 1. No evidence of aortic dissection. No evidence of aneurysmal dilatation. Calcification at the aortic valve. Scattered aortic atherosclerosis noted. 2. No evidence of significant pulmonary embolus. 3. Cholelithiasis. Suggestion of mild gallbladder wall thickening, with trace surrounding inflammation, raising question for acute cholecystitis. Would correlate with the patient's symptoms. 4. Moderate hiatal hernia noted. 5. Accessory azygos lobe noted. Minimal left basilar atelectasis seen. Lungs otherwise clear. 6. Nonspecific 1.2 cm hypodensity within the posterior right hepatic lobe. Would correlate with LFTs. 7. Mild degenerative change along the lumbar spine.  Impression  1.  Presentation with lower thoracic and abdominal discomfort as outlined above. Hypertensive as well, cardiac markers argue against ACS with ECG showing LVH. CT imaging rules out pulmonary embolus and shows no evidence of aortic aneurysm or dissection. She does have atherosclerotic calcifications however. Also moderate-sized hiatal hernia and cholelithiasis although not entirely clear whether this represents acute cholecystitis. LFTs are normal. Risk factors include age and hyperlipidemia.  2. Cardiac murmur suggesting aortic valve disease.  3. Elevated blood pressure presentation with outstanding diagnosis of essential hypertension.  4. Hyperlipidemia by history, managed by diet.  Recommendations  Discussed with patient and son. Will follow-up on the echocardiogram which is pending to assess cardiac structure and function as well as aortic valve. Plan to tentatively schedule a Lexiscan Myoview for tomorrow to evaluate for ischemia. At this point etiology is not entirely clear.  [name], M.D., F.A.C.C.

## 2016-08-30 NOTE — ED Triage Notes (Signed)
Pt states she started having chest pain around 10pm last night.

## 2016-08-31 ENCOUNTER — Other Ambulatory Visit: Payer: Self-pay | Admitting: Cardiology

## 2016-08-31 ENCOUNTER — Encounter (HOSPITAL_COMMUNITY): Payer: Self-pay

## 2016-08-31 ENCOUNTER — Observation Stay (HOSPITAL_BASED_OUTPATIENT_CLINIC_OR_DEPARTMENT_OTHER): Payer: Medicare Other

## 2016-08-31 DIAGNOSIS — R072 Precordial pain: Secondary | ICD-10-CM | POA: Diagnosis not present

## 2016-08-31 DIAGNOSIS — I350 Nonrheumatic aortic (valve) stenosis: Secondary | ICD-10-CM | POA: Diagnosis not present

## 2016-08-31 DIAGNOSIS — I119 Hypertensive heart disease without heart failure: Secondary | ICD-10-CM | POA: Diagnosis present

## 2016-08-31 DIAGNOSIS — E782 Mixed hyperlipidemia: Secondary | ICD-10-CM | POA: Diagnosis not present

## 2016-08-31 DIAGNOSIS — R079 Chest pain, unspecified: Secondary | ICD-10-CM | POA: Diagnosis not present

## 2016-08-31 LAB — NM MYOCAR MULTI W/SPECT W/WALL MOTION / EF
CHL CUP NUCLEAR SDS: 0
CHL CUP NUCLEAR SRS: 9
LHR: 0.29
LV dias vol: 61 mL (ref 46–106)
LVSYSVOL: 15 mL
NUC STRESS TID: 1.25
SSS: 9

## 2016-08-31 MED ORDER — TECHNETIUM TC 99M TETROFOSMIN IV KIT
30.0000 | PACK | Freq: Once | INTRAVENOUS | Status: AC | PRN
  Administered 2016-08-31: 30 via INTRAVENOUS

## 2016-08-31 MED ORDER — SODIUM CHLORIDE 0.9% FLUSH
INTRAVENOUS | Status: AC
  Administered 2016-08-31: 10 mL via INTRAVENOUS
  Filled 2016-08-31: qty 10

## 2016-08-31 MED ORDER — REGADENOSON 0.4 MG/5ML IV SOLN
INTRAVENOUS | Status: AC
  Administered 2016-08-31: 0.4 mg via INTRAVENOUS
  Filled 2016-08-31: qty 5

## 2016-08-31 MED ORDER — TECHNETIUM TC 99M TETROFOSMIN IV KIT
10.0000 | PACK | Freq: Once | INTRAVENOUS | Status: AC | PRN
  Administered 2016-08-31: 10.11 via INTRAVENOUS

## 2016-08-31 NOTE — Progress Notes (Signed)
Patient Name: [name] Date of Encounter: 08/31/2016  Consulting Cardiologist: Dr. Darlin Coco Problem List     Principal Problem:   Chest pain Active Problems:   Hypothyroidism   Hyperlipidemia   Uncontrolled hypertension   Moderate aortic stenosis   Hypertensive cardiovascular disease    Subjective   No chest pain or dyspnea.  Inpatient Medications    Scheduled Meds: . aspirin EC  325 mg Oral Daily  . pantoprazole  40 mg Oral Daily  . regadenoson  0.4 mg Intravenous Once    PRN Meds: acetaminophen, morphine injection, ondansetron (ZOFRAN) IV, technetium tetrofosmin   Vital Signs    Vitals:   08/30/16 0500 08/30/16 0822 08/30/16 2147 08/31/16 0500  BP: (!) 168/67 (!) 193/66 (!) 159/51 (!) 154/63  Pulse: 70 67 75 79  Resp: 18  18 18   Temp: 98.1 F (36.7 C) 98.2 F (36.8 C) 98.4 F (36.9 C) 98.2 F (36.8 C)  TempSrc: Oral Oral Oral Oral  SpO2: 95% 96% 95% 96%  Weight:      Height:        Intake/Output Summary (Last 24 hours) at 08/31/16 0823 Last data filed at 08/30/16 1700  Gross per 24 hour  Intake              120 ml  Output                0 ml  Net              120 ml   Filed Weights   08/30/16 0059 08/30/16 0317  Weight: 220 lb (99.8 kg) 244 lb 6.4 oz (110.9 kg)    Physical Exam    GEN: Obese, pale, female in no acute distress.  HEENT: Grossly normal.  Neck: Supple, no JVD, carotid bruits, or masses. Cardiac: RRR, no murmurs, rubs, or gallops. No clubbing, cyanosis, edema.  Radials/DP/PT 2+ and equal bilaterally.  Respiratory:  Few basilar crackles MS: No deformity or atrophy.   Labs    CBC  Recent Labs  08/30/16 0111 08/30/16 0122  WBC 10.2  --   NEUTROABS 6.7  --   HGB 14.5 15.3*  HCT 44.5 45.0  MCV 86.2  --   PLT 254  --    Basic Metabolic Panel  Recent Labs  08/30/16 0111 08/30/16 0122  NA 136 139  K 3.6 3.8  CL 101 102  CO2 27  --   GLUCOSE 120* 116*  BUN 15 18  CREATININE 0.69  0.70  CALCIUM 8.6*  --    Liver Function Tests  Recent Labs  08/30/16 0111  AST 17  ALT 10*  ALKPHOS 72  BILITOT 0.4  PROT 6.8  ALBUMIN 3.8    Recent Labs  08/30/16 0111  LIPASE 27   Cardiac Enzymes  Recent Labs  08/30/16 0333 08/30/16 0557 08/30/16 0940  TROPONINI <0.03 <0.03 <0.03    Telemetry     NSR - Personally Reviewed.  ECG    NSR, incomplete RBBB, LVH - Personally Reviewed.  Radiology    Dg Chest Portable 1 View  Result Date: 08/30/2016 CLINICAL DATA:  Sudden onset of central chest pain radiating to the interscapular region, 3 hours duration EXAM: PORTABLE CHEST 1 VIEW COMPARISON:  04/06/2011 FINDINGS: Unchanged cardiomegaly. Unchanged linear left base scarring. The lungs are otherwise clear. The pulmonary vasculature is normal. Hilar and mediastinal contours are unremarkable and unchanged. IMPRESSION: Stable cardiomegaly.  No acute findings. Electronically Signed  By: Ellery Plunkaniel R Mitchell M.D.   On: 08/30/2016 01:49   Ct Angio Chest/abd/pel For Dissection W And/or Wo Contrast  Result Date: 08/30/2016 CLINICAL DATA:  Acute onset of central chest pain, radiating between the scapulae and to the abdomen. Initial encounter. EXAM: CT ANGIOGRAPHY CHEST, ABDOMEN AND PELVIS TECHNIQUE: Multidetector CT imaging through the chest, abdomen and pelvis was performed using the standard protocol during bolus administration of intravenous contrast. Multiplanar reconstructed images and MIPs were obtained and reviewed to evaluate the vascular anatomy. CONTRAST:  100 mL of Isovue 370 IV contrast COMPARISON:  Chest radiograph performed earlier today at 1:17 a.m. FINDINGS: CTA CHEST FINDINGS Cardiovascular: There is no evidence of aortic dissection. There is no evidence of aneurysmal dilatation. Calcification is seen at the aortic valve. Minimal calcification is noted along the aortic arch and descending thoracic aorta. Minimal mural thrombus is seen along the descending thoracic  aorta. The great vessels are grossly unremarkable in appearance. There is no evidence of significant pulmonary embolus. Mediastinum/Nodes: The mediastinum is unremarkable in appearance, aside from a moderate hiatal hernia. No mediastinal lymphadenopathy is seen. No pericardial effusion is identified. The thyroid gland is not definitely characterized. No axillary lymphadenopathy is seen. Lungs/Pleura: An accessory azygos lobe is noted. Minimal left basilar atelectasis is noted. The lungs are otherwise clear. No pleural effusion or pneumothorax is seen. No masses are identified. Musculoskeletal: No acute osseous abnormalities are identified. The visualized musculature is unremarkable in appearance. Review of the MIP images confirms the above findings. CTA ABDOMEN AND PELVIS FINDINGS VASCULAR Aorta: Scattered calcification is noted along the abdominal aorta and its branches. No significant luminal narrowing is seen. Celiac: There is minimal narrowing of the proximal celiac trunk, with mild calcification. SMA: The superior mesenteric artery is fully patent, with mild calcification at the origin of the superior mesenteric artery. Renals: The renal arteries appear patent bilaterally. IMA: The inferior mesenteric artery appears patent. Inflow: Mild calcification is noted along the common, external and internal iliac arteries bilaterally. The common femoral arteries appear fully patent bilaterally. Veins: The inferior vena cava is grossly unremarkable in appearance. The visualized venous structures are grossly unremarkable. Review of the MIP images confirms the above findings. NON-VASCULAR Hepatobiliary: A 1.2 cm hypodensity within the posterior right hepatic lobe is nonspecific. The liver is otherwise unremarkable. Stones are noted dependently within the gallbladder. There is suggestion of mild gallbladder wall thickening, with trace surrounding inflammation, raising question for acute cholecystitis. The common bile duct  remains normal in caliber. Pancreas: The pancreas is within normal limits. Spleen: The spleen is unremarkable in appearance. Adrenals/Urinary Tract: The adrenal glands are unremarkable in appearance. The kidneys are within normal limits. There is no evidence of hydronephrosis. No renal or ureteral stones are identified. No perinephric stranding is seen. Stomach/Bowel: The stomach is unremarkable in appearance. The small bowel is within normal limits. The appendix is normal in caliber, without evidence of appendicitis. The colon is unremarkable in appearance. Lymphatic: No retroperitoneal or pelvic sidewall lymphadenopathy is seen. Reproductive: The bladder is moderately distended and grossly unremarkable. The uterus is unremarkable in appearance. The ovaries are not well characterized. No suspicious adnexal masses are seen. Other: No additional soft tissue abnormalities are seen. Musculoskeletal: No acute osseous abnormalities are identified. Multilevel vacuum phenomenon is noted along the lumbar spine. [name] left hip arthroplasty is incompletely imaged, but appears grossly unremarkable. The visualized musculature is unremarkable in appearance. Review of the MIP images confirms the above findings. IMPRESSION: 1. No evidence of aortic dissection.  No evidence of aneurysmal dilatation. Calcification at the aortic valve. Scattered aortic atherosclerosis noted. 2. No evidence of significant pulmonary embolus. 3. Cholelithiasis. Suggestion of mild gallbladder wall thickening, with trace surrounding inflammation, raising question for acute cholecystitis. Would correlate with the patient's symptoms. 4. Moderate hiatal hernia noted. 5. Accessory azygos lobe noted. Minimal left basilar atelectasis seen. Lungs otherwise clear. 6. Nonspecific 1.2 cm hypodensity within the posterior right hepatic lobe. Would correlate with LFTs. 7. Mild degenerative change along the lumbar spine. Electronically Signed   By: Roanna RaiderJeffery   Chang M.D.   On: 08/30/2016 02:09    Cardiac Studies   Echo 08/30/16  - Mild LVH with LVEF 65-70% and grade 1 diastolic dysfunction.   Severe left atrial enlargement. Calcified mitral annulus with   trivial mitral regurgitation. Severely calcified aortic annulus   with moderate aortic stenosis as outlined above. Trivial   tricuspid regurgitation, unable to estimate PASP.  Patient Profile     82 y.o.female admitted [facility] with chest and epigastric pain. Cardiac markers are normal, ECG with LVH, CTA of the chest, abdomen, and pelvis shows calcification of the aorta without obvious dissection or aneurysm, no pulmonary embolus, cholelithiasis with mild gallbladder wall thickening and trace surrounding inflammation, moderate-sized hiatal hernia. LFTs are normal.  Assessment & Plan    1. Presentation with lower thoracic and abdominal discomfort. Hypertensive as well, cardiac markers argue against ACS with ECG showing LVH. CT imaging rules out pulmonary embolus and shows no evidence of aortic aneurysm or dissection. She does have atherosclerotic calcifications however.    2. Cholelithiasis-incidental finding on CTA, not entirely clear whether this represents acute cholecystitis. LFTs are normal.   2. Moderate AS by echocardiogram, unlikely cause of current symptoms.  3. HTN - uncontrolled on adm with normal LVF and mild LVH by echo.  4. Hyperlipidemia by history, managed by diet.  Plan : Myoview today. Consider adding Amlodipine or ACE for HTN.  SignedCorine Shelter, Luke Kilroy, PA-C  08/31/2016, 8:23 AM   Attending note:  Patient seen and examined. Agree with above assessment by Mr. Lenn CalKilroy PA-C. Ms. Jarold Mottoatterson has had no further chest pain and is undergoing a Myoview today for ischemic evaluation. She does have moderate aortic stenosis which will need to be followed, but is not a likely cause of her current symptoms. We will follow-up on her stress test results later.  Jonelle SidleSamuel G.  McDowell, M.D., F.A.C.C.

## 2016-08-31 NOTE — Discharge Summary (Signed)
Physician Discharge Summary  Patient ID: [name] MRN: [id number] DOB/AGE: 11-22-1932 82 y.o. Primary Care Physician:Buren Havey L, MD Admit date: 08/30/2016 Discharge date: 08/31/2016    Discharge Diagnoses:   Principal Problem:   Chest pain Active Problems:   Hypothyroidism   Hyperlipidemia   Uncontrolled hypertension   Moderate aortic stenosis   Hypertensive cardiovascular disease     Medication List    TAKE these medications   aspirin EC 81 MG tablet Take 81 mg by mouth daily.   ibuprofen 200 MG tablet Commonly known as:  ADVIL,MOTRIN Take 600 mg by mouth daily as needed for moderate pain.   levothyroxine 150 MCG tablet Commonly known as:  SYNTHROID, LEVOTHROID Take 150 mcg by mouth every morning.   omeprazole 20 MG capsule Commonly known as:  PRILOSEC Take 20 mg by mouth daily.   PARoxetine 20 MG tablet Commonly known as:  PAXIL Take 20 mg by mouth every evening.   solifenacin 10 MG tablet Commonly known as:  VESICARE Take 10 mg by mouth daily.       Discharged Condition:Improved    Consults: Cardiology  Significant Diagnostic Studies: Nm Myocar Multi W/spect W/wall Motion / Ef  Result Date: 08/31/2016  No diagnostic ST segment changes to indicate ischemia. No arrhythmias.  Small, mild intensity, fixed mid to basal inferolateral defect suggestive of soft tissue attenuation rather than scar in light of normal wall motion in this area. No large ischemic territories are noted.  This is a low risk study.  Nuclear stress EF: 76%.    Dg Chest Portable 1 View  Result Date: 08/30/2016 CLINICAL DATA:  Sudden onset of central chest pain radiating to the interscapular region, 3 hours duration EXAM: PORTABLE CHEST 1 VIEW COMPARISON:  04/06/2011 FINDINGS: Unchanged cardiomegaly. Unchanged linear left base scarring. The lungs are otherwise clear. The pulmonary vasculature is normal. Hilar and mediastinal contours are unremarkable and unchanged.  IMPRESSION: Stable cardiomegaly.  No acute findings. Electronically Signed   By: Ellery Plunk M.D.   On: 08/30/2016 01:49   Ct Angio Chest/abd/pel For Dissection W And/or Wo Contrast  Result Date: 08/30/2016 CLINICAL DATA:  Acute onset of central chest pain, radiating between the scapulae and to the abdomen. Initial encounter. EXAM: CT ANGIOGRAPHY CHEST, ABDOMEN AND PELVIS TECHNIQUE: Multidetector CT imaging through the chest, abdomen and pelvis was performed using the standard protocol during bolus administration of intravenous contrast. Multiplanar reconstructed images and MIPs were obtained and reviewed to evaluate the vascular anatomy. CONTRAST:  100 mL of Isovue 370 IV contrast COMPARISON:  Chest radiograph performed earlier today at 1:17 a.m. FINDINGS: CTA CHEST FINDINGS Cardiovascular: There is no evidence of aortic dissection. There is no evidence of aneurysmal dilatation. Calcification is seen at the aortic valve. Minimal calcification is noted along the aortic arch and descending thoracic aorta. Minimal mural thrombus is seen along the descending thoracic aorta. The great vessels are grossly unremarkable in appearance. There is no evidence of significant pulmonary embolus. Mediastinum/Nodes: The mediastinum is unremarkable in appearance, aside from a moderate hiatal hernia. No mediastinal lymphadenopathy is seen. No pericardial effusion is identified. The thyroid gland is not definitely characterized. No axillary lymphadenopathy is seen. Lungs/Pleura: An accessory azygos lobe is noted. Minimal left basilar atelectasis is noted. The lungs are otherwise clear. No pleural effusion or pneumothorax is seen. No masses are identified. Musculoskeletal: No acute osseous abnormalities are identified. The visualized musculature is unremarkable in appearance. Review of the MIP images confirms the above findings. CTA ABDOMEN AND PELVIS  FINDINGS VASCULAR Aorta: Scattered calcification is noted along the  abdominal aorta and its branches. No significant luminal narrowing is seen. Celiac: There is minimal narrowing of the proximal celiac trunk, with mild calcification. SMA: The superior mesenteric artery is fully patent, with mild calcification at the origin of the superior mesenteric artery. Renals: The renal arteries appear patent bilaterally. IMA: The inferior mesenteric artery appears patent. Inflow: Mild calcification is noted along the common, external and internal iliac arteries bilaterally. The common femoral arteries appear fully patent bilaterally. Veins: The inferior vena cava is grossly unremarkable in appearance. The visualized venous structures are grossly unremarkable. Review of the MIP images confirms the above findings. NON-VASCULAR Hepatobiliary: A 1.2 cm hypodensity within the posterior right hepatic lobe is nonspecific. The liver is otherwise unremarkable. Stones are noted dependently within the gallbladder. There is suggestion of mild gallbladder wall thickening, with trace surrounding inflammation, raising question for acute cholecystitis. The common bile duct remains normal in caliber. Pancreas: The pancreas is within normal limits. Spleen: The spleen is unremarkable in appearance. Adrenals/Urinary Tract: The adrenal glands are unremarkable in appearance. The kidneys are within normal limits. There is no evidence of hydronephrosis. No renal or ureteral stones are identified. No perinephric stranding is seen. Stomach/Bowel: The stomach is unremarkable in appearance. The small bowel is within normal limits. The appendix is normal in caliber, without evidence of appendicitis. The colon is unremarkable in appearance. Lymphatic: No retroperitoneal or pelvic sidewall lymphadenopathy is seen. Reproductive: The bladder is moderately distended and grossly unremarkable. The uterus is unremarkable in appearance. The ovaries are not well characterized. No suspicious adnexal masses are seen. Other: No  additional soft tissue abnormalities are seen. Musculoskeletal: No acute osseous abnormalities are identified. Multilevel vacuum phenomenon is noted along the lumbar spine. [name] left hip arthroplasty is incompletely imaged, but appears grossly unremarkable. The visualized musculature is unremarkable in appearance. Review of the MIP images confirms the above findings. IMPRESSION: 1. No evidence of aortic dissection. No evidence of aneurysmal dilatation. Calcification at the aortic valve. Scattered aortic atherosclerosis noted. 2. No evidence of significant pulmonary embolus. 3. Cholelithiasis. Suggestion of mild gallbladder wall thickening, with trace surrounding inflammation, raising question for acute cholecystitis. Would correlate with the patient's symptoms. 4. Moderate hiatal hernia noted. 5. Accessory azygos lobe noted. Minimal left basilar atelectasis seen. Lungs otherwise clear. 6. Nonspecific 1.2 cm hypodensity within the posterior right hepatic lobe. Would correlate with LFTs. 7. Mild degenerative change along the lumbar spine. Electronically Signed   By: Roanna Raider M.D.   On: 08/30/2016 02:09    Lab Results: Basic Metabolic Panel:  Recent Labs  29/56/21 0111 08/30/16 0122  NA 136 139  K 3.6 3.8  CL 101 102  CO2 27  --   GLUCOSE 120* 116*  BUN 15 18  CREATININE 0.69 0.70  CALCIUM 8.6*  --    Liver Function Tests:  Recent Labs  08/30/16 0111  AST 17  ALT 10*  ALKPHOS 72  BILITOT 0.4  PROT 6.8  ALBUMIN 3.8     CBC:  Recent Labs  08/30/16 0111 08/30/16 0122  WBC 10.2  --   NEUTROABS 6.7  --   HGB 14.5 15.3*  HCT 44.5 45.0  MCV 86.2  --   PLT 254  --     No results found for this or any previous visit (from the past 240 hour(s)).   Hospital Course: This is an 82 year old who came to the emergency department because of chest discomfort. It  felt different than what she had in the past. She underwent CT scanning that showed her gallbladder was enlarged.  She is known to have reflux. However her pain totally resolved with 1 nitroglycerin. She had cardiology consultation she ruled out for acute MI and she underwent stress testing. Her stress test was low risk so she's going to be allowed to go home. She was told to restart her PPI regularly  Discharge Exam: Blood pressure (!) 157/82, pulse 77, temperature 98.8 F (37.1 C), temperature source Oral, resp. rate 18, height 5\' 9"  (1.753 m), weight 110.9 kg (244 lb 6.4 oz), SpO2 96 %. She is awake and alert and in no acute distress. Chest is clear. Heart is regular. Her abdomen is soft  Disposition: Home take her PPI. She has an appointment in my office already scheduled and she will keep that.    Follow-up Information    Nona DellSamuel McDowell, MD Follow up in 6 month(s).   Specialty:  Cardiology Why:  Office will conatct you Contact information: [address] [place] [address] [phone]           Signed: Niyam Bisping L   08/31/2016, 3:03 PM

## 2016-08-31 NOTE — Progress Notes (Signed)
Subjective: She is scheduled for cardiac stress testing this morning.  Objective: Vital signs in last 24 hours: Temp:  [98.2 F (36.8 C)-98.4 F (36.9 C)] 98.2 F (36.8 C) (12/05 0500) Pulse Rate:  [75-79] 79 (12/05 0500) Resp:  [18] 18 (12/05 0500) BP: (154-159)/(51-63) 154/63 (12/05 0500) SpO2:  [95 %-96 %] 96 % (12/05 0500) Weight change:  Last BM Date: 08/29/16  Intake/Output from previous day: 12/04 0701 - 12/05 0700 In: 120 [P.O.:120] Out: -   PHYSICAL EXAM General appearance: alert, cooperative and no distress Resp: clear to auscultation bilaterally Cardio: Her heart is regular with systolic heart murmur no gallop GI: soft, non-tender; bowel sounds normal; no masses,  no organomegaly Extremities: extremities normal, atraumatic, no cyanosis or edema Skin warm and dry. Mucous membranes moist. Pupils reactive. EOMI.  Lab Results:  Results for orders placed or performed during the hospital encounter of 08/30/16 (from the past 48 hour(s))  CBC with Differential/Platelet     Status: Abnormal   Collection Time: 08/30/16  1:11 AM  Result Value Ref Range   WBC 10.2 4.0 - 10.5 K/uL   RBC 5.16 (H) 3.87 - 5.11 MIL/uL   Hemoglobin 14.5 12.0 - 15.0 g/dL   HCT 44.5 36.0 - 46.0 %   MCV 86.2 78.0 - 100.0 fL   MCH 28.1 26.0 - 34.0 pg   MCHC 32.6 30.0 - 36.0 g/dL   RDW 14.0 11.5 - 15.5 %   Platelets 254 150 - 400 K/uL   Neutrophils Relative % 65 %   Neutro Abs 6.7 1.7 - 7.7 K/uL   Lymphocytes Relative 24 %   Lymphs Abs 2.5 0.7 - 4.0 K/uL   Monocytes Relative 7 %   Monocytes Absolute 0.7 0.1 - 1.0 K/uL   Eosinophils Relative 3 %   Eosinophils Absolute 0.3 0.0 - 0.7 K/uL   Basophils Relative 1 %   Basophils Absolute 0.1 0.0 - 0.1 K/uL  Basic metabolic panel     Status: Abnormal   Collection Time: 08/30/16  1:11 AM  Result Value Ref Range   Sodium 136 135 - 145 mmol/L   Potassium 3.6 3.5 - 5.1 mmol/L   Chloride 101 101 - 111 mmol/L   CO2 27 22 - 32 mmol/L   Glucose, Bld  120 (H) 65 - 99 mg/dL   BUN 15 6 - 20 mg/dL   Creatinine, Ser 0.69 0.44 - 1.00 mg/dL   Calcium 8.6 (L) 8.9 - 10.3 mg/dL   GFR calc non Af Amer >60 >60 mL/min   GFR calc Af Amer >60 >60 mL/min    Comment: (NOTE) The eGFR has been calculated using the CKD EPI equation. This calculation has not been validated in all clinical situations. eGFR's persistently <60 mL/min signify possible Chronic Kidney Disease.    Anion gap 8 5 - 15  Troponin I     Status: None   Collection Time: 08/30/16  1:11 AM  Result Value Ref Range   Troponin I <0.03 <0.03 ng/mL  Hepatic function panel     Status: Abnormal   Collection Time: 08/30/16  1:11 AM  Result Value Ref Range   Total Protein 6.8 6.5 - 8.1 g/dL   Albumin 3.8 3.5 - 5.0 g/dL   AST 17 15 - 41 U/L   ALT 10 (L) 14 - 54 U/L   Alkaline Phosphatase 72 38 - 126 U/L   Total Bilirubin 0.4 0.3 - 1.2 mg/dL   Bilirubin, Direct 0.1 0.1 - 0.5 mg/dL  Indirect Bilirubin 0.3 0.3 - 0.9 mg/dL  Lipase, blood     Status: None   Collection Time: 08/30/16  1:11 AM  Result Value Ref Range   Lipase 27 11 - 51 U/L  I-stat chem 8, ed     Status: Abnormal   Collection Time: 08/30/16  1:22 AM  Result Value Ref Range   Sodium 139 135 - 145 mmol/L   Potassium 3.8 3.5 - 5.1 mmol/L   Chloride 102 101 - 111 mmol/L   BUN 18 6 - 20 mg/dL   Creatinine, Ser 0.70 0.44 - 1.00 mg/dL   Glucose, Bld 116 (H) 65 - 99 mg/dL   Calcium, Ion 1.08 (L) 1.15 - 1.40 mmol/L   TCO2 30 0 - 100 mmol/L   Hemoglobin 15.3 (H) 12.0 - 15.0 g/dL   HCT 45.0 36.0 - 46.0 %  I-stat troponin, ED     Status: None   Collection Time: 08/30/16  1:24 AM  Result Value Ref Range   Troponin i, poc 0.00 0.00 - 0.08 ng/mL   Comment 3            Comment: Due to the release kinetics of cTnI, a negative result within the first hours of the onset of symptoms does not rule out myocardial infarction with certainty. If myocardial infarction is still suspected, repeat the test at appropriate intervals.    Troponin I-serum (0, 3, 6 hours)     Status: None   Collection Time: 08/30/16  3:33 AM  Result Value Ref Range   Troponin I <0.03 <0.03 ng/mL  Troponin I-serum (0, 3, 6 hours)     Status: None   Collection Time: 08/30/16  5:57 AM  Result Value Ref Range   Troponin I <0.03 <0.03 ng/mL  Troponin I-serum (0, 3, 6 hours)     Status: None   Collection Time: 08/30/16  9:40 AM  Result Value Ref Range   Troponin I <0.03 <0.03 ng/mL    ABGS  Recent Labs  08/30/16 0122  TCO2 30   CULTURES No results found for this or any previous visit (from the past 240 hour(s)). Studies/Results: Dg Chest Portable 1 View  Result Date: 08/30/2016 CLINICAL DATA:  Sudden onset of central chest pain radiating to the interscapular region, 3 hours duration EXAM: PORTABLE CHEST 1 VIEW COMPARISON:  04/06/2011 FINDINGS: Unchanged cardiomegaly. Unchanged linear left base scarring. The lungs are otherwise clear. The pulmonary vasculature is normal. Hilar and mediastinal contours are unremarkable and unchanged. IMPRESSION: Stable cardiomegaly.  No acute findings. Electronically Signed   By: Andreas Newport M.D.   On: 08/30/2016 01:49   Ct Angio Chest/abd/pel For Dissection W And/or Wo Contrast  Result Date: 08/30/2016 CLINICAL DATA:  Acute onset of central chest pain, radiating between the scapulae and to the abdomen. Initial encounter. EXAM: CT ANGIOGRAPHY CHEST, ABDOMEN AND PELVIS TECHNIQUE: Multidetector CT imaging through the chest, abdomen and pelvis was performed using the standard protocol during bolus administration of intravenous contrast. Multiplanar reconstructed images and MIPs were obtained and reviewed to evaluate the vascular anatomy. CONTRAST:  100 mL of Isovue 370 IV contrast COMPARISON:  Chest radiograph performed earlier today at 1:17 a.m. FINDINGS: CTA CHEST FINDINGS Cardiovascular: There is no evidence of aortic dissection. There is no evidence of aneurysmal dilatation. Calcification is seen at the  aortic valve. Minimal calcification is noted along the aortic arch and descending thoracic aorta. Minimal mural thrombus is seen along the descending thoracic aorta. The great vessels are grossly unremarkable in  appearance. There is no evidence of significant pulmonary embolus. Mediastinum/Nodes: The mediastinum is unremarkable in appearance, aside from a moderate hiatal hernia. No mediastinal lymphadenopathy is seen. No pericardial effusion is identified. The thyroid gland is not definitely characterized. No axillary lymphadenopathy is seen. Lungs/Pleura: An accessory azygos lobe is noted. Minimal left basilar atelectasis is noted. The lungs are otherwise clear. No pleural effusion or pneumothorax is seen. No masses are identified. Musculoskeletal: No acute osseous abnormalities are identified. The visualized musculature is unremarkable in appearance. Review of the MIP images confirms the above findings. CTA ABDOMEN AND PELVIS FINDINGS VASCULAR Aorta: Scattered calcification is noted along the abdominal aorta and its branches. No significant luminal narrowing is seen. Celiac: There is minimal narrowing of the proximal celiac trunk, with mild calcification. SMA: The superior mesenteric artery is fully patent, with mild calcification at the origin of the superior mesenteric artery. Renals: The renal arteries appear patent bilaterally. IMA: The inferior mesenteric artery appears patent. Inflow: Mild calcification is noted along the common, external and internal iliac arteries bilaterally. The common femoral arteries appear fully patent bilaterally. Veins: The inferior vena cava is grossly unremarkable in appearance. The visualized venous structures are grossly unremarkable. Review of the MIP images confirms the above findings. NON-VASCULAR Hepatobiliary: A 1.2 cm hypodensity within the posterior right hepatic lobe is nonspecific. The liver is otherwise unremarkable. Stones are noted dependently within the gallbladder.  There is suggestion of mild gallbladder wall thickening, with trace surrounding inflammation, raising question for acute cholecystitis. The common bile duct remains normal in caliber. Pancreas: The pancreas is within normal limits. Spleen: The spleen is unremarkable in appearance. Adrenals/Urinary Tract: The adrenal glands are unremarkable in appearance. The kidneys are within normal limits. There is no evidence of hydronephrosis. No renal or ureteral stones are identified. No perinephric stranding is seen. Stomach/Bowel: The stomach is unremarkable in appearance. The small bowel is within normal limits. The appendix is normal in caliber, without evidence of appendicitis. The colon is unremarkable in appearance. Lymphatic: No retroperitoneal or pelvic sidewall lymphadenopathy is seen. Reproductive: The bladder is moderately distended and grossly unremarkable. The uterus is unremarkable in appearance. The ovaries are not well characterized. No suspicious adnexal masses are seen. Other: No additional soft tissue abnormalities are seen. Musculoskeletal: No acute osseous abnormalities are identified. Multilevel vacuum phenomenon is noted along the lumbar spine. [name] left hip arthroplasty is incompletely imaged, but appears grossly unremarkable. The visualized musculature is unremarkable in appearance. Review of the MIP images confirms the above findings. IMPRESSION: 1. No evidence of aortic dissection. No evidence of aneurysmal dilatation. Calcification at the aortic valve. Scattered aortic atherosclerosis noted. 2. No evidence of significant pulmonary embolus. 3. Cholelithiasis. Suggestion of mild gallbladder wall thickening, with trace surrounding inflammation, raising question for acute cholecystitis. Would correlate with the patient's symptoms. 4. Moderate hiatal hernia noted. 5. Accessory azygos lobe noted. Minimal left basilar atelectasis seen. Lungs otherwise clear. 6. Nonspecific 1.2 cm hypodensity  within the posterior right hepatic lobe. Would correlate with LFTs. 7. Mild degenerative change along the lumbar spine. Electronically Signed   By: Garald Balding M.D.   On: 08/30/2016 02:09    Medications:  Prior to Admission:  Prescriptions Prior to Admission  Medication Sig Dispense Refill Last Dose  . aspirin EC 81 MG tablet Take 81 mg by mouth daily.   08/29/2016 at Unknown time  . ibuprofen (ADVIL,MOTRIN) 200 MG tablet Take 600 mg by mouth daily as needed for moderate pain.   08/29/2016 at Unknown  time  . levothyroxine (SYNTHROID, LEVOTHROID) 150 MCG tablet Take 150 mcg by mouth every morning.     08/29/2016 at Unknown time  . omeprazole (PRILOSEC) 20 MG capsule Take 20 mg by mouth daily.   08/29/2016 at Unknown time  . PARoxetine (PAXIL) 20 MG tablet Take 20 mg by mouth every evening.     08/29/2016 at Unknown time  . solifenacin (VESICARE) 10 MG tablet Take 10 mg by mouth daily.   08/29/2016 at Unknown time   Scheduled: . aspirin EC  325 mg Oral Daily  . pantoprazole  40 mg Oral Daily  . regadenoson      . regadenoson  0.4 mg Intravenous Once  . sodium chloride flush       Continuous:  ONG:[id number], morphine injection, ondansetron (ZOFRAN) IV, technetium tetrofosmin  Assesment: She was admitted with chest pain. She has multiple other medical problems including aortic stenosis hypertension hyperlipidemia so she has multiple cardiac risk factors. She is scheduled for stress testing today Principal Problem:   Chest pain Active Problems:   Hypothyroidism   Hyperlipidemia   Uncontrolled hypertension   Moderate aortic stenosis   Hypertensive cardiovascular disease    Plan: Cardiac stress testing today    LOS: 0 days   Mingo Siegert L 08/31/2016, 8:37 AM

## 2016-09-08 DIAGNOSIS — Z0000 Encounter for general adult medical examination without abnormal findings: Secondary | ICD-10-CM | POA: Diagnosis not present

## 2016-11-05 DIAGNOSIS — L851 Acquired keratosis [keratoderma] palmaris et plantaris: Secondary | ICD-10-CM | POA: Diagnosis not present

## 2016-11-05 DIAGNOSIS — I739 Peripheral vascular disease, unspecified: Secondary | ICD-10-CM | POA: Diagnosis not present

## 2016-11-05 DIAGNOSIS — B351 Tinea unguium: Secondary | ICD-10-CM | POA: Diagnosis not present

## 2017-01-21 DIAGNOSIS — I739 Peripheral vascular disease, unspecified: Secondary | ICD-10-CM | POA: Diagnosis not present

## 2017-01-21 DIAGNOSIS — L851 Acquired keratosis [keratoderma] palmaris et plantaris: Secondary | ICD-10-CM | POA: Diagnosis not present

## 2017-01-21 DIAGNOSIS — B351 Tinea unguium: Secondary | ICD-10-CM | POA: Diagnosis not present

## 2017-02-12 ENCOUNTER — Inpatient Hospital Stay (HOSPITAL_COMMUNITY)
Admission: EM | Admit: 2017-02-12 | Discharge: 2017-02-16 | DRG: 417 | Disposition: A | Discharge status: Home / Self Care | Payer: Medicare Other | Attending: Cardiovascular Disease | Admitting: Cardiovascular Disease

## 2017-02-12 ENCOUNTER — Observation Stay (HOSPITAL_COMMUNITY): Payer: Medicare Other

## 2017-02-12 ENCOUNTER — Emergency Department (HOSPITAL_COMMUNITY): Payer: Medicare Other

## 2017-02-12 ENCOUNTER — Encounter (HOSPITAL_COMMUNITY): Payer: Self-pay

## 2017-02-12 DIAGNOSIS — F419 Anxiety disorder, unspecified: Secondary | ICD-10-CM | POA: Diagnosis not present

## 2017-02-12 DIAGNOSIS — R1013 Epigastric pain: Secondary | ICD-10-CM | POA: Diagnosis not present

## 2017-02-12 DIAGNOSIS — R0602 Shortness of breath: Secondary | ICD-10-CM | POA: Diagnosis not present

## 2017-02-12 DIAGNOSIS — K760 Fatty (change of) liver, not elsewhere classified: Secondary | ICD-10-CM | POA: Diagnosis present

## 2017-02-12 DIAGNOSIS — R748 Abnormal levels of other serum enzymes: Secondary | ICD-10-CM | POA: Diagnosis present

## 2017-02-12 DIAGNOSIS — I119 Hypertensive heart disease without heart failure: Secondary | ICD-10-CM | POA: Diagnosis present

## 2017-02-12 DIAGNOSIS — K8010 Calculus of gallbladder with chronic cholecystitis without obstruction: Secondary | ICD-10-CM

## 2017-02-12 DIAGNOSIS — R112 Nausea with vomiting, unspecified: Secondary | ICD-10-CM | POA: Diagnosis not present

## 2017-02-12 DIAGNOSIS — I214 Non-ST elevation (NSTEMI) myocardial infarction: Secondary | ICD-10-CM

## 2017-02-12 DIAGNOSIS — R072 Precordial pain: Secondary | ICD-10-CM

## 2017-02-12 DIAGNOSIS — K8050 Calculus of bile duct without cholangitis or cholecystitis without obstruction: Secondary | ICD-10-CM | POA: Diagnosis present

## 2017-02-12 DIAGNOSIS — M1990 Unspecified osteoarthritis, unspecified site: Secondary | ICD-10-CM | POA: Diagnosis present

## 2017-02-12 DIAGNOSIS — I361 Nonrheumatic tricuspid (valve) insufficiency: Secondary | ICD-10-CM | POA: Diagnosis not present

## 2017-02-12 DIAGNOSIS — K8020 Calculus of gallbladder without cholecystitis without obstruction: Secondary | ICD-10-CM | POA: Diagnosis present

## 2017-02-12 DIAGNOSIS — Z96642 Presence of left artificial hip joint: Secondary | ICD-10-CM | POA: Diagnosis present

## 2017-02-12 DIAGNOSIS — E890 Postprocedural hypothyroidism: Secondary | ICD-10-CM | POA: Diagnosis present

## 2017-02-12 DIAGNOSIS — F329 Major depressive disorder, single episode, unspecified: Secondary | ICD-10-CM | POA: Diagnosis present

## 2017-02-12 DIAGNOSIS — E785 Hyperlipidemia, unspecified: Secondary | ICD-10-CM | POA: Diagnosis present

## 2017-02-12 DIAGNOSIS — E876 Hypokalemia: Secondary | ICD-10-CM | POA: Diagnosis not present

## 2017-02-12 DIAGNOSIS — I700 Atherosclerosis of aorta: Secondary | ICD-10-CM | POA: Diagnosis present

## 2017-02-12 DIAGNOSIS — F32A Depression, unspecified: Secondary | ICD-10-CM | POA: Diagnosis present

## 2017-02-12 DIAGNOSIS — R32 Unspecified urinary incontinence: Secondary | ICD-10-CM | POA: Diagnosis present

## 2017-02-12 DIAGNOSIS — Z7982 Long term (current) use of aspirin: Secondary | ICD-10-CM

## 2017-02-12 DIAGNOSIS — Z87891 Personal history of nicotine dependence: Secondary | ICD-10-CM

## 2017-02-12 DIAGNOSIS — R0789 Other chest pain: Secondary | ICD-10-CM | POA: Diagnosis not present

## 2017-02-12 DIAGNOSIS — E782 Mixed hyperlipidemia: Secondary | ICD-10-CM | POA: Diagnosis not present

## 2017-02-12 DIAGNOSIS — K5900 Constipation, unspecified: Secondary | ICD-10-CM | POA: Diagnosis present

## 2017-02-12 DIAGNOSIS — E669 Obesity, unspecified: Secondary | ICD-10-CM | POA: Diagnosis present

## 2017-02-12 DIAGNOSIS — R079 Chest pain, unspecified: Secondary | ICD-10-CM | POA: Diagnosis present

## 2017-02-12 DIAGNOSIS — N23 Unspecified renal colic: Secondary | ICD-10-CM | POA: Diagnosis not present

## 2017-02-12 DIAGNOSIS — E039 Hypothyroidism, unspecified: Secondary | ICD-10-CM | POA: Diagnosis present

## 2017-02-12 DIAGNOSIS — I350 Nonrheumatic aortic (valve) stenosis: Secondary | ICD-10-CM

## 2017-02-12 DIAGNOSIS — I081 Rheumatic disorders of both mitral and tricuspid valves: Secondary | ICD-10-CM | POA: Diagnosis present

## 2017-02-12 DIAGNOSIS — Z96653 Presence of artificial knee joint, bilateral: Secondary | ICD-10-CM | POA: Diagnosis present

## 2017-02-12 DIAGNOSIS — I2510 Atherosclerotic heart disease of native coronary artery without angina pectoris: Secondary | ICD-10-CM | POA: Diagnosis present

## 2017-02-12 DIAGNOSIS — Z79899 Other long term (current) drug therapy: Secondary | ICD-10-CM

## 2017-02-12 DIAGNOSIS — Z6835 Body mass index (BMI) 35.0-35.9, adult: Secondary | ICD-10-CM

## 2017-02-12 DIAGNOSIS — Z993 Dependence on wheelchair: Secondary | ICD-10-CM

## 2017-02-12 DIAGNOSIS — I10 Essential (primary) hypertension: Secondary | ICD-10-CM | POA: Diagnosis not present

## 2017-02-12 DIAGNOSIS — J42 Unspecified chronic bronchitis: Secondary | ICD-10-CM | POA: Diagnosis present

## 2017-02-12 HISTORY — DX: Hypokalemia: E87.6

## 2017-02-12 LAB — CBC WITH DIFFERENTIAL/PLATELET
BASOS PCT: 0 %
Basophils Absolute: 0 10*3/uL (ref 0.0–0.1)
Basophils Absolute: 0 10*3/uL (ref 0.0–0.1)
Basophils Relative: 0 %
EOS ABS: 0 10*3/uL (ref 0.0–0.7)
EOS PCT: 0 %
Eosinophils Absolute: 0.1 10*3/uL (ref 0.0–0.7)
Eosinophils Relative: 1 %
HEMATOCRIT: 42.8 % (ref 36.0–46.0)
HEMATOCRIT: 43.2 % (ref 36.0–46.0)
HEMOGLOBIN: 14.1 g/dL (ref 12.0–15.0)
Hemoglobin: 14.1 g/dL (ref 12.0–15.0)
LYMPHS PCT: 7 %
Lymphocytes Relative: 4 %
Lymphs Abs: 1 10*3/uL (ref 0.7–4.0)
Lymphs Abs: 0.5 10*3/uL — ABNORMAL LOW (ref 0.7–4.0)
MCH: 28.8 pg (ref 26.0–34.0)
MCH: 28.2 pg (ref 26.0–34.0)
MCHC: 32.9 g/dL (ref 30.0–36.0)
MCHC: 32.6 g/dL (ref 30.0–36.0)
MCV: 87.3 fL (ref 78.0–100.0)
MCV: 86.4 fL (ref 78.0–100.0)
MONO ABS: 0.8 10*3/uL (ref 0.1–1.0)
MONOS PCT: 6 %
Monocytes Absolute: 0.8 10*3/uL (ref 0.1–1.0)
Monocytes Relative: 5 %
NEUTROS ABS: 13.1 10*3/uL — AB (ref 1.7–7.7)
NEUTROS ABS: 11.6 10*3/uL — AB (ref 1.7–7.7)
NEUTROS PCT: 87 %
Neutrophils Relative %: 90 %
PLATELETS: 236 10*3/uL (ref 150–400)
Platelets: 249 10*3/uL (ref 150–400)
RBC: 4.9 MIL/uL (ref 3.87–5.11)
RBC: 5 MIL/uL (ref 3.87–5.11)
RDW: 14.3 % (ref 11.5–15.5)
RDW: 14.4 % (ref 11.5–15.5)
WBC: 15 10*3/uL — AB (ref 4.0–10.5)
WBC: 13 10*3/uL — ABNORMAL HIGH (ref 4.0–10.5)

## 2017-02-12 LAB — HEPATIC FUNCTION PANEL
ALK PHOS: 303 U/L — AB (ref 38–126)
ALT: 315 U/L — AB (ref 14–54)
AST: 414 U/L — ABNORMAL HIGH (ref 15–41)
Albumin: 3.6 g/dL (ref 3.5–5.0)
BILIRUBIN DIRECT: 1.8 mg/dL — AB (ref 0.1–0.5)
BILIRUBIN INDIRECT: 1.1 mg/dL — AB (ref 0.3–0.9)
BILIRUBIN TOTAL: 2.9 mg/dL — AB (ref 0.3–1.2)
Total Protein: 6.8 g/dL (ref 6.5–8.1)

## 2017-02-12 LAB — URINALYSIS, ROUTINE W REFLEX MICROSCOPIC
Bacteria, UA: NONE SEEN
GLUCOSE, UA: NEGATIVE mg/dL
Hgb urine dipstick: NEGATIVE
KETONES UR: NEGATIVE mg/dL
Leukocytes, UA: NEGATIVE
Nitrite: NEGATIVE
PROTEIN: 30 mg/dL — AB
Specific Gravity, Urine: 1.017 (ref 1.005–1.030)
pH: 6 (ref 5.0–8.0)

## 2017-02-12 LAB — COMPREHENSIVE METABOLIC PANEL
ALBUMIN: 3.5 g/dL (ref 3.5–5.0)
ALT: 333 U/L — ABNORMAL HIGH (ref 14–54)
ANION GAP: 9 (ref 5–15)
AST: 421 U/L — ABNORMAL HIGH (ref 15–41)
Alkaline Phosphatase: 304 U/L — ABNORMAL HIGH (ref 38–126)
BUN: 10 mg/dL (ref 6–20)
CHLORIDE: 103 mmol/L (ref 101–111)
CO2: 27 mmol/L (ref 22–32)
Calcium: 8.5 mg/dL — ABNORMAL LOW (ref 8.9–10.3)
Creatinine, Ser: 0.56 mg/dL (ref 0.44–1.00)
Glucose, Bld: 148 mg/dL — ABNORMAL HIGH (ref 65–99)
POTASSIUM: 3.4 mmol/L — AB (ref 3.5–5.1)
Sodium: 139 mmol/L (ref 135–145)
TOTAL PROTEIN: 6.7 g/dL (ref 6.5–8.1)
Total Bilirubin: 3.2 mg/dL — ABNORMAL HIGH (ref 0.3–1.2)

## 2017-02-12 LAB — BASIC METABOLIC PANEL
Anion gap: 9 (ref 5–15)
BUN: 11 mg/dL (ref 6–20)
CHLORIDE: 103 mmol/L (ref 101–111)
CO2: 29 mmol/L (ref 22–32)
Calcium: 8.7 mg/dL — ABNORMAL LOW (ref 8.9–10.3)
Creatinine, Ser: 0.62 mg/dL (ref 0.44–1.00)
GFR calc Af Amer: >60 mL/min (ref >60)
Glucose, Bld: 159 mg/dL — ABNORMAL HIGH (ref 65–99)
POTASSIUM: 3 mmol/L — AB (ref 3.5–5.1)
SODIUM: 141 mmol/L (ref 135–145)

## 2017-02-12 LAB — APTT: APTT: 32 s (ref 24–36)

## 2017-02-12 LAB — D-DIMER, QUANTITATIVE: D-Dimer, Quant: 4.14 ug/mL-FEU — ABNORMAL HIGH (ref 0.00–0.50)

## 2017-02-12 LAB — I-STAT TROPONIN, ED: Troponin i, poc: 0.01 ng/mL (ref 0.00–0.08)

## 2017-02-12 LAB — BRAIN NATRIURETIC PEPTIDE: B NATRIURETIC PEPTIDE 5: 137 pg/mL — AB (ref 0.0–100.0)

## 2017-02-12 LAB — TROPONIN I
Troponin I: 0.03 ng/mL (ref <0.03)
Troponin I: 0.27 ng/mL (ref <0.03)
Troponin I: 0.33 ng/mL (ref <0.03)

## 2017-02-12 LAB — BILIRUBIN, DIRECT: Bilirubin, Direct: 2.5 mg/dL — ABNORMAL HIGH (ref 0.1–0.5)

## 2017-02-12 LAB — LIPASE, BLOOD: Lipase: 41 U/L (ref 11–51)

## 2017-02-12 LAB — PROTIME-INR
INR: 0.95
PROTHROMBIN TIME: 12.7 s (ref 11.4–15.2)

## 2017-02-12 LAB — MAGNESIUM: Magnesium: 1.9 mg/dL (ref 1.7–2.4)

## 2017-02-12 LAB — PHOSPHORUS: Phosphorus: 3.1 mg/dL (ref 2.5–4.6)

## 2017-02-12 MED ORDER — IOPAMIDOL (ISOVUE-370) INJECTION 76%
100.0000 mL | Freq: Once | INTRAVENOUS | Status: AC | PRN
  Administered 2017-02-12: 100 mL via INTRAVENOUS

## 2017-02-12 MED ORDER — HYDRALAZINE HCL 20 MG/ML IJ SOLN: 5.0000 mg | Freq: Four times a day (QID) | INTRAMUSCULAR | Status: DC

## 2017-02-12 MED ORDER — ASPIRIN 325 MG PO TABS
325.0000 mg | ORAL_TABLET | Freq: Every day | ORAL | Status: DC
  Administered 2017-02-12 – 2017-02-14 (×3): 325 mg via ORAL
  Filled 2017-02-12 (×4): qty 1

## 2017-02-12 MED ORDER — LEVOTHYROXINE SODIUM 75 MCG PO TABS
150.0000 ug | ORAL_TABLET | Freq: Every day | ORAL | Status: DC
  Administered 2017-02-12 – 2017-02-15 (×4): 150 ug via ORAL
  Filled 2017-02-12 (×4): qty 2

## 2017-02-12 MED ORDER — IOPAMIDOL (ISOVUE-300) INJECTION 61%
INTRAVENOUS | Status: AC
  Administered 2017-02-12: 30 mL
  Filled 2017-02-12: qty 30

## 2017-02-12 MED ORDER — ACETAMINOPHEN 325 MG PO TABS: 650.0000 mg | ORAL_TABLET | Freq: Four times a day (QID) | ORAL | Status: DC | PRN

## 2017-02-12 MED ORDER — PAROXETINE HCL 20 MG PO TABS
20.0000 mg | ORAL_TABLET | Freq: Every evening | ORAL | Status: DC
  Administered 2017-02-12 – 2017-02-16 (×5): 20 mg via ORAL
  Filled 2017-02-12 (×5): qty 1

## 2017-02-12 MED ORDER — POTASSIUM CHLORIDE IN NACL 40-0.9 MEQ/L-% IV SOLN: INTRAVENOUS | Status: DC

## 2017-02-12 MED ORDER — PIPERACILLIN-TAZOBACTAM 3.375 G IVPB
3.3750 g | Freq: Once | INTRAVENOUS | Status: AC
  Administered 2017-02-12: 3.375 g via INTRAVENOUS
  Filled 2017-02-12: qty 50

## 2017-02-12 MED ORDER — LORAZEPAM 0.5 MG PO TABS
0.5000 mg | ORAL_TABLET | Freq: Three times a day (TID) | ORAL | Status: DC | PRN
  Administered 2017-02-12 – 2017-02-15 (×5): 0.5 mg via ORAL
  Filled 2017-02-12 (×5): qty 1

## 2017-02-12 MED ORDER — POTASSIUM CHLORIDE IN NACL 40-0.9 MEQ/L-% IV SOLN
INTRAVENOUS | Status: DC
  Administered 2017-02-12: 50 mL/h via INTRAVENOUS

## 2017-02-12 MED ORDER — POTASSIUM CHLORIDE IN NACL 40-0.9 MEQ/L-% IV SOLN
INTRAVENOUS | Status: AC
  Administered 2017-02-12 – 2017-02-13 (×2): 50 mL/h via INTRAVENOUS

## 2017-02-12 MED ORDER — CEFTRIAXONE SODIUM 2 G IJ SOLR
2.0000 g | Freq: Once | INTRAMUSCULAR | Status: AC
  Administered 2017-02-12: 2 g via INTRAVENOUS
  Filled 2017-02-12: qty 2

## 2017-02-12 MED ORDER — ALBUTEROL SULFATE HFA 108 (90 BASE) MCG/ACT IN AERS: 2.0000 | INHALATION_SPRAY | Freq: Four times a day (QID) | RESPIRATORY_TRACT | Status: DC

## 2017-02-12 MED ORDER — ALBUTEROL SULFATE (2.5 MG/3ML) 0.083% IN NEBU
2.5000 mg | INHALATION_SOLUTION | Freq: Four times a day (QID) | RESPIRATORY_TRACT | Status: DC
  Administered 2017-02-12 – 2017-02-13 (×4): 2.5 mg via RESPIRATORY_TRACT
  Filled 2017-02-12 (×5): qty 3

## 2017-02-12 MED ORDER — POTASSIUM CHLORIDE 10 MEQ/100ML IV SOLN
10.0000 meq | INTRAVENOUS | Status: AC
  Administered 2017-02-12 (×2): 10 meq via INTRAVENOUS
  Filled 2017-02-12 (×2): qty 100

## 2017-02-12 MED ORDER — ASPIRIN 81 MG PO CHEW
324.0000 mg | CHEWABLE_TABLET | Freq: Once | ORAL | Status: AC
  Administered 2017-02-12: 324 mg via ORAL
  Filled 2017-02-12: qty 4

## 2017-02-12 MED ORDER — PIPERACILLIN-TAZOBACTAM 3.375 G IVPB 30 MIN: 3.3750 g | Freq: Three times a day (TID) | INTRAVENOUS | Status: DC

## 2017-02-12 MED ORDER — ASPIRIN EC 81 MG PO TBEC
81.0000 mg | DELAYED_RELEASE_TABLET | Freq: Every day | ORAL | Status: DC
  Administered 2017-02-12: 81 mg via ORAL
  Filled 2017-02-12: qty 1

## 2017-02-12 MED ORDER — NITROGLYCERIN 0.4 MG SL SUBL
0.4000 mg | SUBLINGUAL_TABLET | SUBLINGUAL | Status: AC | PRN
Start: 2017-02-12 — End: 2017-02-13
  Administered 2017-02-12 – 2017-02-13 (×3): 0.4 mg via SUBLINGUAL
  Filled 2017-02-12 (×2): qty 1

## 2017-02-12 MED ORDER — FAMOTIDINE IN NACL 20-0.9 MG/50ML-% IV SOLN
20.0000 mg | Freq: Two times a day (BID) | INTRAVENOUS | Status: DC
  Administered 2017-02-12 – 2017-02-15 (×9): 20 mg via INTRAVENOUS
  Filled 2017-02-12 (×10): qty 50

## 2017-02-12 MED ORDER — ONDANSETRON HCL 4 MG/2ML IJ SOLN
4.0000 mg | Freq: Four times a day (QID) | INTRAMUSCULAR | Status: DC | PRN
  Administered 2017-02-12: 4 mg via INTRAVENOUS
  Filled 2017-02-12: qty 2

## 2017-02-12 MED ORDER — SODIUM CHLORIDE 0.45 % IV SOLN: INTRAVENOUS | Status: DC

## 2017-02-12 MED ORDER — DARIFENACIN HYDROBROMIDE ER 15 MG PO TB24
15.0000 mg | ORAL_TABLET | Freq: Every day | ORAL | Status: DC
  Administered 2017-02-12 – 2017-02-16 (×5): 15 mg via ORAL
  Filled 2017-02-12: qty 2
  Filled 2017-02-12: qty 1
  Filled 2017-02-12: qty 2
  Filled 2017-02-12: qty 1
  Filled 2017-02-12 (×2): qty 2

## 2017-02-12 MED ORDER — TOLTERODINE TARTRATE ER 4 MG PO CP24
4.0000 mg | ORAL_CAPSULE | Freq: Every day | ORAL | Status: DC
  Filled 2017-02-12 (×2): qty 1

## 2017-02-12 NOTE — ED Notes (Signed)
Patient returned from X-ray 

## 2017-02-12 NOTE — Progress Notes (Signed)
CRITICAL VALUE ALERT  Critical value received:  Troponin 0.33  Date of notification:  02/12/17  Time of notification:  2000  Critical value read back:Yes.    Nurse who received alert:  THandy  MD notified (1st page):  R.Dondeigo  Time of first page:  2010  MD notified (2nd page): R.Dondeigo  Time of second page: 202  Responding MD:  Burns Spain Dondeigo  Time MD responded:  2021

## 2017-02-12 NOTE — Progress Notes (Signed)
This is an assumption of care note. She was admitted early this morning with chest discomfort. This appears to be more related to a GI disturbance than to her heart. She said that she vomited 6 times before she came to the emergency department. She feels like she's aching all over. She has had continued nausea but she's not vomited since she arrived at the hospital. Her chest pain is gone except for the generalized aching. She is scheduled for ultrasound of the abdomen this morning. Scheduled for surgical consultation this morning  She had cardiac stress testing in December of last year or so about 6 months ago that was low risk. Initial troponin is negative. BNP only 137. Her alkaline phosphatase AST ALT are elevated. White blood count 13,000. Bilirubin 3.2. Her potassium was 3.4 and is being replaced  Exam shows that she looks acutely sick. Cardiovascular: Her heart is regular with a systolic heart murmur. Respiratory: Her respiratory effort is normal and her lungs are clear. Gastrointestinal: She has minimal diffuse tenderness. Skin: Warm and dry  Assessment concern is that she may have cholelithiasis/choledocholithiasis/cholangitis. She is not severely tender in her right upper quadrant but she does have some mild diffuse abdominal tenderness. She has elevated liver function testing. She has an elevated white blood cell count. Troponin is normal  Plan: For ultrasound and surgical consultation and perhaps GI consultation depending on results of ultrasound

## 2017-02-12 NOTE — H&P (Signed)
History and Physical    [name]:[id number] DOB: [birth year] DOA: 02/12/2017  PCP: Paige Baars, MD   Patient coming from: Home  I have personally briefly reviewed patient's old medical records in [name]  Chief Complaint: Chest pain.  HPI: [name] is a 83 y.o. female with medical history significant of anxiety, osteoarthritis, moderate aortic stenosis, hypertensive heart disease, chronic bronchitis, constipation, depression, hyperlipidemia, hypothyroidism who was brought to the emergency department via EMS after having chest pain at home since 2200 last night.   Per patient, she went to the store for grocery shopping around 10:30 yesterday. She returned home later in the morning. She normally shops once a month, had to unload a lot of items and put them in place. After this, she felt tired and went to bed around 1300. She mentioned that she normally takes a nap from 1300 to 1600. However, yesterday she slept 9 hours until she woke up around 2200. When she woke up she was having pressure-like chest pain that radiated to her back, associated with dyspnea, nausea and emesis. She mentioned that she had 6 episodes of emesis prior to coming to the ER and vomiting partially relieved her pain. However, she stated that around 2300 she took ibuprofen since the pain was worsening. After this, the pain did not get better so around 0130, she called EMS who gave her 4 mg of Zofran. She denies fever, chills, productive cough, wheezing, dizziness, diaphoresis, palpitations, diarrhea, constipation, melena or hematochezia. She has urinary incontinence, but denies dysuria or hematuria.  ED Course: The patient was given sublingual nitroglycerin in the emergency department which resolved her chest pain. She is currently chest pain-free. She was also given ceftriaxone 1 g IV PB.  Lab workup: Troponin level was normal. EKG shows artifact on lateral leads, but was otherwise  unchanged. WBC 15 with ADD percent neutrophils, hemoglobin 14.1 g/dL and platelets 147. Sodium 141, potassium 3.0, chloride 103 and bicarbonate 29 mmol/L. Total bilirubin was 2.9, direct bilirubin 1.8, indirect bilirubin 1.1, BUN 11, creatinine 0.62, glucose 159, magnesium 1.9 and phosphorus 3.1 mg/dL. Lipase level was 41, AST 414, ALP 315 alkaline phosphatase 303 units.  Imaging: Chest radiograph showed cardiomegaly with low lung volumes with bronchovascular crowding, difficult to exclude vascular congestion.  Review of Systems: As per HPI otherwise 10 point review of systems negative.    Past Medical History:  Diagnosis Date  . Anxiety   . Arthritis   . Blood transfusion   . Chronic bronchitis   . Constipation   . Depression   . Hyperlipidemia   . Hypothyroidism     Past Surgical History:  Procedure Laterality Date  . EYE SURGERY  2009   bilateral  . HEMATOMA EVACUATION  06/04/2011   Procedure: EVACUATION HEMATOMA;  Surgeon: Paige Canada, MD;  Location: [facility];  Service: Orthopedics;  Laterality: Right;  evacuation of seroma  . JOINT REPLACEMENT  05/2010   left total knee [name]  . Left hip replacement @ [facility] '98    . Left total knee    . PATELLAR TENDON REPAIR  06/04/2011   Procedure: PATELLA TENDON REPAIR;  Surgeon: Paige Canada, MD;  Location: [facility];  Service: Orthopedics;  Laterality: Right;  . PATELLAR TENDON REPAIR  08/30/2011   Procedure: PATELLA TENDON REPAIR;  Surgeon: Paige Canada, MD;  Location: [facility];  Service: Orthopedics;  Laterality: Right;  Allograft Reconstrucation Right Patella Tendon  . REPLACEMENT TOTAL KNEE BILATERAL    .  TOTAL HIP ARTHROPLASTY    . TOTAL KNEE ARTHROPLASTY  05/03/2011   Procedure: TOTAL KNEE ARTHROPLASTY;  Surgeon: Paige Canada, MD;  Location: [facility];  Service: Orthopedics;  Laterality: Right;  With DePuy  . TOTAL THYROIDECTOMY  March 2011   [name] @ [facility]  . TUBAL LIGATION       reports that she has never smoked. She  has never used smokeless tobacco. She reports that she does not drink alcohol or use drugs.  No Known Allergies  Family History  Problem Relation Age of Onset  . Colon cancer Mother   . Blindness Sister   . Anesthesia problems Neg Hx   . Hypotension Neg Hx   . Malignant hyperthermia Neg Hx   . Pseudochol deficiency Neg Hx     Prior to Admission medications   Medication Sig Start Date End Date Taking? Authorizing Provider  aspirin EC 81 MG tablet Take 81 mg by mouth daily.   Yes [provider]  furosemide (LASIX) 40 MG tablet Take 40 mg by mouth.   Yes [provider]  ibuprofen (ADVIL,MOTRIN) 200 MG tablet Take 600 mg by mouth daily as needed for moderate pain.   Yes [provider]  levothyroxine (SYNTHROID, LEVOTHROID) 150 MCG tablet Take 150 mcg by mouth every morning.     Yes [provider]  omeprazole (PRILOSEC) 20 MG capsule Take 20 mg by mouth daily.   Yes [provider]  PARoxetine (PAXIL) 20 MG tablet Take 20 mg by mouth every evening.     Yes [provider]  solifenacin (VESICARE) 10 MG tablet Take 10 mg by mouth daily.   Yes [provider]    Physical Exam: Vitals:   02/12/17 0210 02/12/17 0220 02/12/17 0300 02/12/17 0330  BP: (!) 170/69 140/63 (!) 147/59 (!) 157/63  Pulse: 79 80 79 76  Resp: 18 16 20 19   Temp:      TempSrc:      SpO2: 98% 97% 96% 97%  Weight:      Height:       Constitutional: NAD, calm, comfortable Eyes: PERRL, lids and conjunctivae normal ENMT: Mucous membranes are mildly dry. Posterior pharynx clear of any exudate or lesions. Neck: normal, supple, no masses, no thyromegaly Respiratory: Bibasilar rales, no wheezing, no crackles. Normal respiratory effort. No accessory muscle use.  Cardiovascular: Regular rate and rhythm, Positive 4/6 SEM, no rubs / gallops. Trace lower extremity edema. 2+ pedal pulses. No carotid bruits.  Abdomen: Soft, no tenderness, no masses palpated. No  hepatosplenomegaly. Bowel sounds positive.  Musculoskeletal: no clubbing / cyanosis. Good ROM, no contractures. Normal muscle tone. Hallus valgus deformity of her right foot. Skin: no rashes, lesions, ulcers.  Neurologic: CN 2-12 grossly intact. Sensation intact, DTR normal. Strength 5/5 in all 4.  Psychiatric: Normal judgment and insight. Alert and oriented x 3. Normal mood.    Labs on Admission: I have personally reviewed following labs and imaging studies  CBC:  Recent Labs Lab 02/12/17 0154  WBC 15.0*  NEUTROABS 13.1*  HGB 14.1  HCT 42.8  MCV 87.3  PLT 249   Basic Metabolic Panel:  Recent Labs Lab 02/12/17 0154  NA 141  K 3.0*  CL 103  CO2 29  GLUCOSE 159*  BUN 11  CREATININE 0.62  CALCIUM 8.7*  MG 1.9  PHOS 3.1   GFR: Estimated Creatinine Clearance: 74.3 mL/min (by C-G formula based on SCr of 0.62 mg/dL). Liver Function Tests:  Recent Labs Lab 02/12/17  0154  AST 414*  ALT 315*  ALKPHOS 303*  BILITOT 2.9*  PROT 6.8  ALBUMIN 3.6    Recent Labs Lab 02/12/17 0154  LIPASE 41   No results for input(s): AMMONIA in the last 168 hours. Coagulation Profile: No results for input(s): INR, PROTIME in the last 168 hours. Cardiac Enzymes: No results for input(s): CKTOTAL, CKMB, CKMBINDEX, TROPONINI in the last 168 hours. BNP (last 3 results) No results for input(s): PROBNP in the last 8760 hours. HbA1C: No results for input(s): HGBA1C in the last 72 hours. CBG: No results for input(s): GLUCAP in the last 168 hours. Lipid Profile: No results for input(s): CHOL, HDL, LDLCALC, TRIG, CHOLHDL, LDLDIRECT in the last 72 hours. Thyroid Function Tests: No results for input(s): TSH, T4TOTAL, FREET4, T3FREE, THYROIDAB in the last 72 hours. Anemia Panel: No results for input(s): VITAMINB12, FOLATE, FERRITIN, TIBC, IRON, RETICCTPCT in the last 72 hours. Urine analysis:    Component Value Date/Time   COLORURINE YELLOW 02/05/2014 1511   APPEARANCEUR CLEAR  02/05/2014 1511   LABSPEC 1.020 02/05/2014 1511   PHURINE 5.5 02/05/2014 1511   GLUCOSEU NEGATIVE 02/05/2014 1511   HGBUR NEGATIVE 02/05/2014 1511   BILIRUBINUR NEGATIVE 02/05/2014 1511   KETONESUR NEGATIVE 02/05/2014 1511   PROTEINUR NEGATIVE 02/05/2014 1511   UROBILINOGEN 0.2 02/05/2014 1511   NITRITE NEGATIVE 02/05/2014 1511   LEUKOCYTESUR NEGATIVE 02/05/2014 1511    Radiological Exams on Admission: Dg Chest 2 View  Result Date: 02/12/2017 CLINICAL DATA:  Central chest pain and shortness of breath. EXAM: CHEST  2 VIEW COMPARISON:  Radiograph 08/30/2016 FINDINGS: Lower lung volumes from prior exam leading to bronchovascular crowding. Unchanged cardiomegaly and mediastinal contours. Question of vascular congestion. No consolidation, pleural fluid or pneumothorax. Chronic degenerative change of both shoulders. IMPRESSION: Cardiomegaly. Low lung volumes with bronchovascular crowding, difficult to exclude vascular congestion. Electronically Signed   By: Rubye Oaks M.D.   On: 02/12/2017 02:58   08/31/2016 nuclear medicine stress test   No diagnostic ST segment changes to indicate ischemia. No arrhythmias.  Small, mild intensity, fixed mid to basal inferolateral defect suggestive of soft tissue attenuation rather than scar in light of normal wall motion in this area. No large ischemic territories are noted.  This is a low risk study.  Nuclear stress EF: 76%.  Echocardiogram 08/30/2016  ------------------------------------------------------------------- LV EF: 65% -   70%  ------------------------------------------------------------------- Indications:      Abnormal EKG 794.31.  ------------------------------------------------------------------- History:   PMH:  Chest Pain.  Risk factors:  Hypertension. Dyslipidemia.  ------------------------------------------------------------------- Study Conclusions  - Left ventricle: The cavity size was normal. Wall thickness  was   increased in a pattern of mild LVH. Systolic function was   vigorous. The estimated ejection fraction was in the range of 65%   to 70%. Wall motion was normal; there were no regional wall   motion abnormalities. Doppler parameters are consistent with   abnormal left ventricular relaxation (grade 1 diastolic   dysfunction). - Aortic valve: Severely calcified annulus. Trileaflet; moderately   calcified leaflets. There was moderate stenosis. Mean gradient   (S): 26 mm Hg. Peak gradient (S): 50 mm Hg. Valve area (VTI):   1.41 cm^2. Valve area (Vmax): 1.31 cm^2. Valve area (Vmean): 1.36   cm^2. - Mitral valve: Calcified annulus. There was trivial regurgitation. - Left atrium: The atrium was severely dilated. - Right atrium: Central venous pressure (est): 3 mm Hg. - Atrial septum: No defect or patent foramen ovale was identified. - Tricuspid valve: There  was trivial regurgitation. - Pulmonary arteries: Systolic pressure could not be accurately   estimated. - Pericardium, extracardiac: There was no pericardial effusion.  Impressions:  - Mild LVH with LVEF 65-70% and grade 1 diastolic dysfunction.   Severe left atrial enlargement. Calcified mitral annulus with   trivial mitral regurgitation. Severely calcified aortic annulus   with moderate aortic stenosis as outlined above. Trivial   tricuspid regurgitation, unable to estimate PASP.   EKG: Independently reviewed. Vent. rate 80 BPM PR interval * ms QRS duration 104 ms QT/QTc 378/436 ms P-R-T axes -33 -36 111 Sinus rhythm RSR' in V1 or V2, right VCD or RVH Inferior infarct, old Abnormal ekg Motion artifact on lateral leads, otherwise similar to previous ECG from 08/31/2016.  Assessment/Plan Principal Problem:   Chest pain [name] pain was relieved by sublingual nitroglycerin.  She had a low risk stress test in December. Admit to telemetry/observation. Trend troponin levels. Supplemental oxygen as needed Continue  nitroglycerin as needed.  Active Problems:   Hyperbilirubinemia Monitor LFTs. Recheck imaging for cholelithiasis. Check PT/INR with next troponin measurement.     Gallstones Keep nothing by mouth Check RUQ ultrasound. Zosyn every 8 hours to cover for cholelithiasis induced cholecystitis. Follow-up WBCs. General surgery evaluation in a.m.    Hypokalemia Carefully replacing with IV fluids and KCl 10 mEq infusions 2. However, given patient's aortic stenosis may switch to oral replacement and discontinue IV fluids later.    Hypothyroidism Continue Synthroid 150 g by mouth daily.    Hyperlipidemia Not on medical treatment.    Hypertensive cardiovascular disease Not on antihypertensives. Per patient, she has not been told that she has hypertension. Treatment limited due to AS.  Monitor blood pressure.    Moderate aortic stenosis. Last echo done in December 2017. She is supposed to have a follow-up echocardiogram next month on the fifth. Check BNP level The patient needs to follow-up with cardiology.    Depression Continue paroxetine 20 mg by mouth daily.    Anxiety Continue paroxetine 20 mg by mouth daily.       DVT prophylaxis: SCDs. Code Status: Full code. Family Communication:  Disposition Plan: Admit for troponin level cycling, gallstone/hyperbilirubinemia workup. Consults called: General surgery. Admission status: Observation/telemetry.   Bobette Moavid Manuel Lyncoln Maskell MD [facility] Pager [phone].  If 7PM-7AM, please contact night-coverage [email or web address] Password [email or web address]  02/12/2017, 4:18 AM

## 2017-02-12 NOTE — Consult Note (Signed)
Referring Provider: No ref. provider found Primary Care Physician:  Kari Baars, MD Primary Gastroenterologist:  Jonette Eva  Reason for Consultation:  ELEVATED LIVER ENZYMES   Impression: ADMITTED WITH CHEST PAIN. CHEST PAIN IS GETTING BETTER. Etiology unclear: cardiac ischemia, acute bronchitis, les likely pulmonary embolus. ELEVATED LIVER ENZYMES: MIXED PATTERN(AST > ALT) & U/S W/O GALLSTONES OR DILATED CBD- DIFFERENTIAL DIAGNOSIS INCLUDES: MEDICATION REACTION(?DETROL), R SIDE HEART FAILURE/PE, OCCULT CBD STONES OR CHOLECYSTITIS, LESS LIKELY OVERLAP SYNDROME.  Plan: 1. DISCUSSED WITH PCP: DR. DONDIEGO. PT IN MILD RESPIRATORY DISTRESS. ADD INCENTIVE SPIROMETER AND ALB MDI 2 PUFFS Q6H. MONITOR FOR TACHYCARDIA. 2. STAT CT ANGIO OF CHEST AND CT ABD TO EVALUATE FOR PE AND CBD STONES. IF NEG THEN WILL NEED MRCP.  IF CBD STONE PRESENT PT WILL NEED TRANSFER TO [place] [facility] FOR ERCP DUE TO MULTIPLE CO-MORBIDITIES. DISCUSSED PROCEDURE, & BENEFITS OF PROCEDURE.  3. SUPPORTIVE CARE. HYDRATE x 24 HOURS. 4. FULL LIQUID DIET. 5. HOLD ASA WHILE WORKUP FOR CBD STONE ONGOING.   HPI:  CAME TO ED @10  PM WITH SEVERE CHEST PAIN/SOB. STARTED ALL OF A SUDDEN. NO COUGH. [facility] STAY SINCE JAN 2018. ONLY HAS VOMITING WHEN SHE HAS AFTER CHEST PAIN STARTED(x6, NO BLOOD). NO EATING OUT YESTERDAY. ORDERED PIZZA DAY BEFORE YESTERDAY AND ATE SOME AFTERNOON. WAS ON VESICARE AND BEEN ON DETROL FOR ?3 DAYS. SHE FEELS LIKE SOMETHING IS IN HER LUNGS.   PT DENIES FEVER, CHILLS, HEMATOCHEZIA, nausea, melena, diarrhea, CHANGE IN BOWEL IN HABITS, constipation, abdominal pain, problems swallowing, problems with sedation, OR heartburn or indigestion.  Past Medical History:  Diagnosis Date  . Anxiety   . Arthritis   . Blood transfusion   . Chronic bronchitis   . Constipation   . Depression   . Hyperlipidemia   . Hypothyroidism     Past Surgical History:  Procedure Laterality Date  . EYE SURGERY   2009   bilateral  . HEMATOMA EVACUATION  06/04/2011   Procedure: EVACUATION HEMATOMA;  Surgeon: Fuller Canada, MD;  Location: [facility];  Service: Orthopedics;  Laterality: Right;  evacuation of seroma  . JOINT REPLACEMENT  05/2010   left total knee Dr. Romeo Apple  . Left hip replacement @ [facility] '98    . Left total knee    . PATELLAR TENDON REPAIR  06/04/2011   Procedure: PATELLA TENDON REPAIR;  Surgeon: Fuller Canada, MD;  Location: [facility];  Service: Orthopedics;  Laterality: Right;  . PATELLAR TENDON REPAIR  08/30/2011   Procedure: PATELLA TENDON REPAIR;  Surgeon: Fuller Canada, MD;  Location: [facility];  Service: Orthopedics;  Laterality: Right;  Allograft Reconstrucation Right Patella Tendon  . REPLACEMENT TOTAL KNEE BILATERAL    . TOTAL HIP ARTHROPLASTY    . TOTAL KNEE ARTHROPLASTY  05/03/2011   Procedure: TOTAL KNEE ARTHROPLASTY;  Surgeon: Fuller Canada, MD;  Location: [facility];  Service: Orthopedics;  Laterality: Right;  With DePuy  . TOTAL THYROIDECTOMY  March 2011   Dr. Suszanne Conners @ [facility]  . TUBAL LIGATION     Prior to Admission medications   Medication Sig Start Date End Date Taking? Authorizing Provider  ALPRAZolam (XANAX) 0.25 MG tablet Take 1 tablet by mouth 4 (four) times daily. 01/17/17     aspirin EC 81 MG tablet Take 81 mg by mouth daily.      furosemide (LASIX) 40 MG tablet Take 40 mg by mouth.      ibuprofen (ADVIL,MOTRIN) 200 MG tablet Take 600 mg by mouth daily as needed  for moderate pain.      levothyroxine (SYNTHROID, LEVOTHROID) 150 MCG tablet Take 150 mcg by mouth every morning.        omeprazole (PRILOSEC) 20 MG capsule Take 20 mg by mouth daily.      PARoxetine (PAXIL) 20 MG tablet Take 20 mg by mouth every evening.        tolterodine (DETROL LA) 4 MG 24 hr capsule Take 4 mg by mouth daily. 02/09/17       Current Facility-Administered Medications  Medication Dose Route Frequency Provider Last Rate Last Dose  . aspirin EC tablet 81 mg  81 mg Oral Daily Franky Macho, MD      . darifenacin (ENABLEX) 24 hr tablet 15 mg  15 mg Oral Daily Bobette Mo, MD     . famotidine (PEPCID) IVPB 20 mg premix  20 mg Intravenous Q12H Bobette Mo, MD     . levothyroxine (SYNTHROID, LEVOTHROID) tablet 150 mcg  150 mcg Oral QAC breakfast Bobette Mo, MD     . nitroGLYCERIN (NITROSTAT) SL tablet 0.4 mg  0.4 mg Sublingual Q5 min PRN Zadie Rhine, MD     . ondansetron [name]) injection 4 mg  4 mg Intravenous Q6H PRN Bobette Mo, MD     . PARoxetine (PAXIL) tablet 20 mg  20 mg Oral QPM Bobette Mo, MD        Allergies as of 02/12/2017  . (No Known Allergies)   Family History  Problem Relation Age of Onset  . Colon cancer Mother   . Blindness Sister   . Anesthesia problems Neg Hx   . Hypotension Neg Hx   . Malignant hyperthermia Neg Hx   . Pseudochol deficiency Neg Hx    Social History   Social History  . Marital status: Widowed    Spouse name: N/A  . Number of children: HAD 4 AND 3 LIVING.   . Years of education: N/A   Occupational History  . Not on file.   Social History Main Topics  . Smoking status: Never Smoker  . Smokeless tobacco: Never Used  . Alcohol use No  . Drug use: No  . Sexual activity: No   Other Topics Concern  . 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       Review of Systems: PER HPI OTHERWISE ALL SYSTEMS ARE NEGATIVE. MAY SEE BLOOD IN URINE ONCE IN A WHILE. NO DYSURIA.   Vitals: Blood pressure (!) 149/55, pulse 78, temperature 99.7 F (37.6 C), temperature source Axillary, resp. rate 18, height 5\' 11"  (1.803 m), weight 244 lb (110.7 kg), SpO2 93 %.  Physical Exam: General:   Alert,  Well-developed, well-nourished, pleasant and cooperative in mild distress when laying flat. UNABLE TO COMPLETE FULL SENTENCES WHEN LAYING FLAT Head:  Normocephalic and atraumatic. Eyes:  Sclera clear, no icterus.   Conjunctiva pink. Mouth:  No lesions, dentition  abnormal. Neck:  Supple; no masses. Lungs:  Poor AR MOVEMENT BILATERALLY, DIMINISHED BREATH SOUNDS IN BASES BILATERALLY. AUDIBLE wheezes WHEN PT SPEAKS WHILE LAYING FLAT. UNABLE TO APPRECIATE ON ASCULTATION.  Heart:  Regular rate and rhythm; no murmurs, clicks, rubs,  or gallops. Abdomen:  Soft, nontender and nondistended. No masses, hepatosplenomegaly or hernias noted. Normal bowel sounds, without guarding, and without rebound.   Msk:  Symmetrical without gross deformities. Normal posture. Extremities:  Without edema. Neurologic:  Alert and  oriented x4;  grossly normal  neurologically. Cervical Nodes:  No significant cervical adenopathy. Psych:  Alert and cooperative. Normal mood and affect.   Lab Results:  Recent Labs  02/12/17 0154 02/12/17 0551  WBC 15.0* 13.0*  HGB 14.1 14.1  HCT 42.8 43.2  PLT 249 236   BMET  Recent Labs  02/12/17 0154 02/12/17 0551  NA 141 139  K 3.0* 3.4*  CL 103 103  CO2 29 27  GLUCOSE 159* 148*  BUN 11 10  CREATININE 0.62 0.56  CALCIUM 8.7* 8.5*   LFT  Recent Labs  02/12/17 0154 02/12/17 0551  PROT 6.8 6.7  ALBUMIN 3.6 3.5  AST 414* 421*  ALT 315* 333*  ALKPHOS 303* 304*  BILITOT 2.9* 3.2*  BILIDIR 1.8*  --   IBILI 1.1*  --      Studies/Results: MAY 19-RUQ U/S-No gallstones. No bile duct dilation. Fatty liver.  LOS: 0 days   [facility] Center[facility] Lumi Winslett  02/12/2017, 11:38 AM

## 2017-02-12 NOTE — Progress Notes (Signed)
MD made aware of critical troponin level of 0.03. No new orders at this time.

## 2017-02-12 NOTE — Progress Notes (Signed)
Troponin, d-dimer, and CT results gone over with MD. No new orders at this time.

## 2017-02-12 NOTE — Consult Note (Addendum)
Reason for Consult: upper abdominal pain, hyperbilirubinemia Referring Physician: Dr. Hawkins  [name] is an 84 y.o. female.  HPI: Patient is an 84-year-old white female who was experiencing chest pain at home and presented to the emergency room for further evaluation and treatment. Initial cardiac workup was negative. She was found to have mild upper abdominal pain and liver enzyme tests were noted to be elevated. She also had leukocytosis. She was admitted to the hospital for further evaluation and treatment. She states she has no pain currently. She denies having pain like this in the past. Patient is somewhat a variable historian.  Past Medical History:  Diagnosis Date  . Anxiety   . Arthritis   . Blood transfusion   . Chronic bronchitis   . Constipation   . Depression   . Hyperlipidemia   . Hypothyroidism     Past Surgical History:  Procedure Laterality Date  . EYE SURGERY  2009   bilateral  . HEMATOMA EVACUATION  06/04/2011   Procedure: EVACUATION HEMATOMA;  Surgeon: Stanley Harrison, MD;  Location: [facility];  Service: Orthopedics;  Laterality: Right;  evacuation of seroma  . JOINT REPLACEMENT  05/2010   left total knee Dr. Harrison  . Left hip replacement @ [facility] '98    . Left total knee    . PATELLAR TENDON REPAIR  06/04/2011   Procedure: PATELLA TENDON REPAIR;  Surgeon: Stanley Harrison, MD;  Location: [facility];  Service: Orthopedics;  Laterality: Right;  . PATELLAR TENDON REPAIR  08/30/2011   Procedure: PATELLA TENDON REPAIR;  Surgeon: Stanley Harrison, MD;  Location: [facility];  Service: Orthopedics;  Laterality: Right;  Allograft Reconstrucation Right Patella Tendon  . REPLACEMENT TOTAL KNEE BILATERAL    . TOTAL HIP ARTHROPLASTY    . TOTAL KNEE ARTHROPLASTY  05/03/2011   Procedure: TOTAL KNEE ARTHROPLASTY;  Surgeon: Stanley Harrison, MD;  Location: [facility];  Service: Orthopedics;  Laterality: Right;  With DePuy  . TOTAL THYROIDECTOMY  March 2011   Dr. Teoh @ [facility]  . TUBAL  LIGATION      Family History  Problem Relation Age of Onset  . Colon cancer Mother   . Blindness Sister   . Anesthesia problems Neg Hx   . Hypotension Neg Hx   . Malignant hyperthermia Neg Hx   . Pseudochol deficiency Neg Hx     Social History:  reports that she has never smoked. She has never used smokeless tobacco. She reports that she does not drink alcohol or use drugs.  Allergies: No Known Allergies  Medications:  Scheduled: . aspirin EC  81 mg Oral Daily  . darifenacin  15 mg Oral Daily  . levothyroxine  150 mcg Oral QAC breakfast  . PARoxetine  20 mg Oral QPM    Results for orders placed or performed during the hospital encounter of 02/12/17 (from the past 48 hour(s))  CBC with Differential/Platelet     Status: Abnormal   Collection Time: 02/12/17  1:54 AM  Result Value Ref Range   WBC 15.0 (H) 4.0 - 10.5 K/uL   RBC 4.90 3.87 - 5.11 MIL/uL   Hemoglobin 14.1 12.0 - 15.0 g/dL   HCT 42.8 36.0 - 46.0 %   MCV 87.3 78.0 - 100.0 fL   MCH 28.8 26.0 - 34.0 pg   MCHC 32.9 30.0 - 36.0 g/dL   RDW 14.3 11.5 - 15.5 %   Platelets 249 150 - 400 K/uL   Neutrophils Relative % 87 %   Neutro   Abs 13.1 (H) 1.7 - 7.7 K/uL   Lymphocytes Relative 7 %   Lymphs Abs 1.0 0.7 - 4.0 K/uL   Monocytes Relative 5 %   Monocytes Absolute 0.8 0.1 - 1.0 K/uL   Eosinophils Relative 1 %   Eosinophils Absolute 0.1 0.0 - 0.7 K/uL   Basophils Relative 0 %   Basophils Absolute 0.0 0.0 - 0.1 K/uL  Basic metabolic panel     Status: Abnormal   Collection Time: 02/12/17  1:54 AM  Result Value Ref Range   Sodium 141 135 - 145 mmol/L   Potassium 3.0 (L) 3.5 - 5.1 mmol/L   Chloride 103 101 - 111 mmol/L   CO2 29 22 - 32 mmol/L   Glucose, Bld 159 (H) 65 - 99 mg/dL   BUN 11 6 - 20 mg/dL   Creatinine, Ser 0.62 0.44 - 1.00 mg/dL   Calcium 8.7 (L) 8.9 - 10.3 mg/dL   GFR calc non Af Amer >60 >60 mL/min   GFR calc Af Amer >60 >60 mL/min    Comment: (NOTE) The eGFR has been calculated using the CKD EPI  equation. This calculation has not been validated in all clinical situations. eGFR's persistently <60 mL/min signify possible Chronic Kidney Disease.    Anion gap 9 5 - 15  Hepatic function panel     Status: Abnormal   Collection Time: 02/12/17  1:54 AM  Result Value Ref Range   Total Protein 6.8 6.5 - 8.1 g/dL   Albumin 3.6 3.5 - 5.0 g/dL   AST 414 (H) 15 - 41 U/L   ALT 315 (H) 14 - 54 U/L   Alkaline Phosphatase 303 (H) 38 - 126 U/L   Total Bilirubin 2.9 (H) 0.3 - 1.2 mg/dL   Bilirubin, Direct 1.8 (H) 0.1 - 0.5 mg/dL   Indirect Bilirubin 1.1 (H) 0.3 - 0.9 mg/dL  Lipase, blood     Status: None   Collection Time: 02/12/17  1:54 AM  Result Value Ref Range   Lipase 41 11 - 51 U/L  Magnesium     Status: None   Collection Time: 02/12/17  1:54 AM  Result Value Ref Range   Magnesium 1.9 1.7 - 2.4 mg/dL  Phosphorus     Status: None   Collection Time: 02/12/17  1:54 AM  Result Value Ref Range   Phosphorus 3.1 2.5 - 4.6 mg/dL  I-stat troponin, ED     Status: None   Collection Time: 02/12/17  2:16 AM  Result Value Ref Range   Troponin i, poc 0.01 0.00 - 0.08 ng/mL   Comment 3            Comment: Due to the release kinetics of cTnI, a negative result within the first hours of the onset of symptoms does not rule out myocardial infarction with certainty. If myocardial infarction is still suspected, repeat the test at appropriate intervals.   Urinalysis, Routine w reflex microscopic     Status: Abnormal   Collection Time: 02/12/17  4:26 AM  Result Value Ref Range   Color, Urine AMBER (A) YELLOW    Comment: BIOCHEMICALS MAY BE AFFECTED BY COLOR   APPearance CLEAR CLEAR   Specific Gravity, Urine 1.017 1.005 - 1.030   pH 6.0 5.0 - 8.0   Glucose, UA NEGATIVE NEGATIVE mg/dL   Hgb urine dipstick NEGATIVE NEGATIVE   Bilirubin Urine SMALL (A) NEGATIVE   Ketones, ur NEGATIVE NEGATIVE mg/dL   Protein, ur 30 (A) NEGATIVE mg/dL   Nitrite NEGATIVE   NEGATIVE   Leukocytes, UA NEGATIVE  NEGATIVE   RBC / HPF 6-30 0 - 5 RBC/hpf   WBC, UA 0-5 0 - 5 WBC/hpf   Bacteria, UA NONE SEEN NONE SEEN   Squamous Epithelial / LPF 0-5 (A) NONE SEEN   Mucous PRESENT   CBC WITH DIFFERENTIAL     Status: Abnormal   Collection Time: 02/12/17  5:51 AM  Result Value Ref Range   WBC 13.0 (H) 4.0 - 10.5 K/uL   RBC 5.00 3.87 - 5.11 MIL/uL   Hemoglobin 14.1 12.0 - 15.0 g/dL   HCT 43.2 36.0 - 46.0 %   MCV 86.4 78.0 - 100.0 fL   MCH 28.2 26.0 - 34.0 pg   MCHC 32.6 30.0 - 36.0 g/dL   RDW 14.4 11.5 - 15.5 %   Platelets 236 150 - 400 K/uL   Neutrophils Relative % 90 %   Neutro Abs 11.6 (H) 1.7 - 7.7 K/uL   Lymphocytes Relative 4 %   Lymphs Abs 0.5 (L) 0.7 - 4.0 K/uL   Monocytes Relative 6 %   Monocytes Absolute 0.8 0.1 - 1.0 K/uL   Eosinophils Relative 0 %   Eosinophils Absolute 0.0 0.0 - 0.7 K/uL   Basophils Relative 0 %   Basophils Absolute 0.0 0.0 - 0.1 K/uL  Comprehensive metabolic panel     Status: Abnormal   Collection Time: 02/12/17  5:51 AM  Result Value Ref Range   Sodium 139 135 - 145 mmol/L   Potassium 3.4 (L) 3.5 - 5.1 mmol/L   Chloride 103 101 - 111 mmol/L   CO2 27 22 - 32 mmol/L   Glucose, Bld 148 (H) 65 - 99 mg/dL   BUN 10 6 - 20 mg/dL   Creatinine, Ser 0.56 0.44 - 1.00 mg/dL   Calcium 8.5 (L) 8.9 - 10.3 mg/dL   Total Protein 6.7 6.5 - 8.1 g/dL   Albumin 3.5 3.5 - 5.0 g/dL   AST 421 (H) 15 - 41 U/L   ALT 333 (H) 14 - 54 U/L   Alkaline Phosphatase 304 (H) 38 - 126 U/L   Total Bilirubin 3.2 (H) 0.3 - 1.2 mg/dL   GFR calc non Af Amer >60 >60 mL/min   GFR calc Af Amer >60 >60 mL/min    Comment: (NOTE) The eGFR has been calculated using the CKD EPI equation. This calculation has not been validated in all clinical situations. eGFR's persistently <60 mL/min signify possible Chronic Kidney Disease.    Anion gap 9 5 - 15  Brain natriuretic peptide     Status: Abnormal   Collection Time: 02/12/17  5:51 AM  Result Value Ref Range   B Natriuretic Peptide 137.0 (H) 0.0 -  100.0 pg/mL  Troponin I (q 6hr x 3)     Status: Abnormal   Collection Time: 02/12/17  7:58 AM  Result Value Ref Range   Troponin I 0.03 (HH) <0.03 ng/mL    Comment: CRITICAL RESULT CALLED TO, READ BACK BY AND VERIFIED WITH: [facility] AT 1791 ON 02/12/2017 BY MOSLEY,J   Protime-INR     Status: None   Collection Time: 02/12/17  7:58 AM  Result Value Ref Range   Prothrombin Time 12.7 11.4 - 15.2 seconds   INR 0.95   APTT     Status: None   Collection Time: 02/12/17  7:58 AM  Result Value Ref Range   aPTT 32 24 - 36 seconds    Dg Chest 2 View  Result Date: 02/12/2017 CLINICAL  DATA:  Central chest pain and shortness of breath. EXAM: CHEST  2 VIEW COMPARISON:  Radiograph 08/30/2016 FINDINGS: Lower lung volumes from prior exam leading to bronchovascular crowding. Unchanged cardiomegaly and mediastinal contours. Question of vascular congestion. No consolidation, pleural fluid or pneumothorax. Chronic degenerative change of both shoulders. IMPRESSION: Cardiomegaly. Low lung volumes with bronchovascular crowding, difficult to exclude vascular congestion. Electronically Signed   By: Melanie  Ehinger M.D.   On: 02/12/2017 02:58    ROS:  Review of systems not obtained due to patient factors.  Blood pressure (!) 149/55, pulse 78, temperature 98.9 F (37.2 C), temperature source Oral, resp. rate 18, height 5' 11" (1.803 m), weight 244 lb (110.7 kg), SpO2 93 %. Physical Exam: Pleasant black female in no acute distress. Head is normocephalic, atraumatic. Eye examination reveals no scleral icterus Neck is supple without lymphadenopathy or JVD Lungs clear auscultation with breath sounds bilaterally. Heart examination reveals a regular rate and rhythm without S3, S4, murmurs Abdomen is soft and not particularly distended or tender. No hepatosplenomegaly is noted. Lower midline surgical scar is present. H&P reviewed, labs reviewed  Assessment/Plan: Impression: Elevated liver enzyme tests,  hyperbilirubinemia, suspect cholelithiasis. One also needs to rule out cholangitis given her elevated total bilirubin. We will get ultrasound of right upper quadrant. Suggest GI consultation just in case she does have choledocholithiasis. May need an MRCP. We will follow with you.    02/12/2017, 10:30 AM     

## 2017-02-12 NOTE — ED Provider Notes (Signed)
[facility] Provider Note   CSN: [id number] Arrival date & time: 02/12/17  0135     History   Chief Complaint Chief Complaint  Patient presents with  . Chest Pain    HPI LUCILA KLECKA is a 83 y.o. female.  The history is provided by the patient.  Chest Pain   This is a new problem. The current episode started 1 to 2 hours ago. The problem occurs constantly. The problem has been gradually improving. The pain is present in the substernal region. The pain is severe. The quality of the pain is described as pressure-like. The pain does not radiate. Associated symptoms include nausea, shortness of breath and vomiting. Pertinent negatives include no abdominal pain, no cough and no fever. Treatments tried: zofran. The treatment provided mild relief. Risk factors include being elderly.  Patient presents with acute onset of chest pain/pressure with associated vomiting She reports she felt well today, she did her monthly grocery shopping.  She went to sleep, and after waking up after 10pm she had chest pressure She also had vomiting which relieved some of her symptoms EMS found patient actively vomiting She was given zofran prior to arrival  Past Medical History:  Diagnosis Date  . Anxiety   . Arthritis   . Blood transfusion   . Chronic bronchitis   . Constipation   . Depression   . Hyperlipidemia   . Hypothyroidism     Patient Active Problem List   Diagnosis Date Noted  . Moderate aortic stenosis 08/31/2016  . Hypertensive cardiovascular disease 08/31/2016  . Chest pain 08/30/2016  . Uncontrolled hypertension 08/30/2016  . Hypothyroidism   . Hyperlipidemia   . S/P knee replacement 09/06/2012  . S/P reconstruction of ligament of knee 11/24/2011  . Patellar tendon rupture 06/04/2011  . S/P total knee replacement 05/13/2011  . KNEE, ARTHRITIS, DEGEN./OSTEO 05/14/2010    Past Surgical History:  Procedure Laterality Date  . EYE SURGERY  2009   bilateral  .  HEMATOMA EVACUATION  06/04/2011   Procedure: EVACUATION HEMATOMA;  Surgeon: Fuller Canada, MD;  Location: [facility];  Service: Orthopedics;  Laterality: Right;  evacuation of seroma  . JOINT REPLACEMENT  05/2010   left total knee Dr. Romeo Apple  . Left hip replacement @ [facility] '98    . Left total knee    . PATELLAR TENDON REPAIR  06/04/2011   Procedure: PATELLA TENDON REPAIR;  Surgeon: Fuller Canada, MD;  Location: [facility];  Service: Orthopedics;  Laterality: Right;  . PATELLAR TENDON REPAIR  08/30/2011   Procedure: PATELLA TENDON REPAIR;  Surgeon: Fuller Canada, MD;  Location: [facility];  Service: Orthopedics;  Laterality: Right;  Allograft Reconstrucation Right Patella Tendon  . REPLACEMENT TOTAL KNEE BILATERAL    . TOTAL HIP ARTHROPLASTY    . TOTAL KNEE ARTHROPLASTY  05/03/2011   Procedure: TOTAL KNEE ARTHROPLASTY;  Surgeon: Fuller Canada, MD;  Location: [facility];  Service: Orthopedics;  Laterality: Right;  With DePuy  . TOTAL THYROIDECTOMY  March 2011   Dr. Suszanne Conners @ [facility]  . TUBAL LIGATION      OB History    No data available       Home Medications    Prior to Admission medications   Medication Sig Start Date End Date Taking? Authorizing Provider  aspirin EC 81 MG tablet Take 81 mg by mouth daily.    [provider]  ibuprofen (ADVIL,MOTRIN) 200 MG tablet Take 600 mg by mouth daily as needed for moderate pain.  [provider]  levothyroxine (SYNTHROID, LEVOTHROID) 150 MCG tablet Take 150 mcg by mouth every morning.      [provider]  omeprazole (PRILOSEC) 20 MG capsule Take 20 mg by mouth daily.    [provider]  PARoxetine (PAXIL) 20 MG tablet Take 20 mg by mouth every evening.      [provider]  solifenacin (VESICARE) 10 MG tablet Take 10 mg by mouth daily.    [provider]    Family History Family History  Problem Relation Age of Onset  . Colon cancer Mother   . Anesthesia problems Neg Hx   . Hypotension Neg Hx     . Malignant hyperthermia Neg Hx   . Pseudochol deficiency Neg Hx     Social History Social History  Substance Use Topics  . Smoking status: Never Smoker  . Smokeless tobacco: Never Used  . Alcohol use No     Allergies   Patient has no known allergies.   Review of Systems Review of Systems  Constitutional: Negative for fever.  Respiratory: Positive for shortness of breath. Negative for cough.   Cardiovascular: Positive for chest pain.  Gastrointestinal: Positive for nausea and vomiting. Negative for abdominal pain.  All other systems reviewed and are negative.    Physical Exam Updated Vital Signs BP (!) 157/67 (BP Location: Left Arm)   Pulse 79   Temp 98.4 F (36.9 C) (Oral)   Resp (!) 22   Ht 5\' 11"  (1.803 m)   Wt 244 lb (110.7 kg)   SpO2 90%   BMI 34.03 kg/m   Physical Exam CONSTITUTIONAL: Elderly, mildly distressed HEAD: Normocephalic/atraumatic EYES: EOMI/PERRL ENMT: Mucous membranes moist NECK: supple no meningeal signs SPINE/BACK:entire spine nontender CV: S1/S2 noted,systolic ejection murmur noted LUNGS: crackles bilaterally, mild tachypnea noted ABDOMEN: soft, nontender, no rebound or guarding, bowel sounds noted throughout abdomen GU:no cva tenderness NEURO: Pt is awake/alert/appropriate, moves all extremitiesx4.  No facial droop.   EXTREMITIES: pulses normal/equal, full ROM SKIN: warm, color normal PSYCH: mildly anxious  ED Treatments / Results  Labs (all labs ordered are listed, but only abnormal results are displayed) Labs Reviewed  CBC WITH DIFFERENTIAL/PLATELET - Abnormal; Notable for the following:       Result Value   WBC 15.0 (*)    Neutro Abs 13.1 (*)    All other components within normal limits  BASIC METABOLIC PANEL - Abnormal; Notable for the following:    Potassium 3.0 (*)    Glucose, Bld 159 (*)    Calcium 8.7 (*)    All other components within normal limits  HEPATIC FUNCTION PANEL - Abnormal; Notable for the following:     AST 414 (*)    ALT 315 (*)    Alkaline Phosphatase 303 (*)    Total Bilirubin 2.9 (*)    Bilirubin, Direct 1.8 (*)    Indirect Bilirubin 1.1 (*)    All other components within normal limits  LIPASE, BLOOD  I-STAT TROPOININ, ED    EKG  EKG Interpretation  Date/Time:  Saturday Feb 12 2017 01:39:50 EDT Ventricular Rate:  80 PR Interval:    QRS Duration: 104 QT Interval:  378 QTC Calculation: 436 R Axis:   -36 Text Interpretation:  Sinus rhythm RSR' in V1 or V2, right VCD or RVH Inferior infarct, old Abnormal ekg Interpretation limited secondary to artifact Confirmed by Zadie Rhine ([id number]) on 02/12/2017 1:44:23 AM       Radiology Dg Chest 2 View  Result  Date: 02/12/2017 CLINICAL DATA:  Central chest pain and shortness of breath. EXAM: CHEST  2 VIEW COMPARISON:  Radiograph 08/30/2016 FINDINGS: Lower lung volumes from prior exam leading to bronchovascular crowding. Unchanged cardiomegaly and mediastinal contours. Question of vascular congestion. No consolidation, pleural fluid or pneumothorax. Chronic degenerative change of both shoulders. IMPRESSION: Cardiomegaly. Low lung volumes with bronchovascular crowding, difficult to exclude vascular congestion. Electronically Signed   By: Rubye OaksMelanie  Ehinger M.D.   On: 02/12/2017 02:58    Procedures Procedures   Medications Ordered in ED Medications  nitroGLYCERIN (NITROSTAT) SL tablet 0.4 mg (0.4 mg Sublingual Given 02/12/17 0225)  cefTRIAXone (ROCEPHIN) 2 g in dextrose 5 % 50 mL IVPB (not administered)  aspirin chewable tablet 324 mg (324 mg Oral Given 02/12/17 0210)     Initial Impression / Assessment and Plan / ED Course  I have reviewed the triage vital signs and the nursing notes.  Pertinent labs & imaging results that were available during my care of the patient were reviewed by me and considered in my medical decision making (see chart for details).     3:14 AM Pt in the ED for onset of CP/vomiting She is now  improved She reports NTG improved her pain On repeat exam, she is well appearing/nontoxic She has focal RUQ tenderness, but otherwise benign abdomen LFT elevated, and previous imaging indicate Cholelithiasis This could represent biliary colic/early cholecystitis Will treat with IV antibiotics Advised admission to have RUQ US However, also reported CP that responded to nitro, which complicates clinical picture Will admit D/w dr Robb Matarrtiz for admission   Final Clinical Impressions(s) / ED Diagnoses   Final diagnoses:  Chest pain, rule out acute myocardial infarction  Biliary colic  Hypokalemia    New Prescriptions New Prescriptions   No medications on file     Zadie RhineWickline, Mitra Duling, MD 02/12/17 [phone]

## 2017-02-12 NOTE — ED Triage Notes (Signed)
Pt arrives via ems with c/o chest pain that started approx 10 pm last night.  Per ems, pt was bent over a sink vomiting when they arrived.  EMS gave 4 mg Zofran en route.

## 2017-02-13 ENCOUNTER — Inpatient Hospital Stay (HOSPITAL_COMMUNITY): Payer: Medicare Other

## 2017-02-13 DIAGNOSIS — Z79899 Other long term (current) drug therapy: Secondary | ICD-10-CM | POA: Diagnosis not present

## 2017-02-13 DIAGNOSIS — Z96642 Presence of left artificial hip joint: Secondary | ICD-10-CM | POA: Diagnosis present

## 2017-02-13 DIAGNOSIS — Z7982 Long term (current) use of aspirin: Secondary | ICD-10-CM | POA: Diagnosis not present

## 2017-02-13 DIAGNOSIS — Z87891 Personal history of nicotine dependence: Secondary | ICD-10-CM | POA: Diagnosis not present

## 2017-02-13 DIAGNOSIS — I214 Non-ST elevation (NSTEMI) myocardial infarction: Secondary | ICD-10-CM | POA: Diagnosis not present

## 2017-02-13 DIAGNOSIS — Z993 Dependence on wheelchair: Secondary | ICD-10-CM | POA: Diagnosis not present

## 2017-02-13 DIAGNOSIS — K8010 Calculus of gallbladder with chronic cholecystitis without obstruction: Secondary | ICD-10-CM | POA: Diagnosis not present

## 2017-02-13 DIAGNOSIS — E039 Hypothyroidism, unspecified: Secondary | ICD-10-CM | POA: Diagnosis not present

## 2017-02-13 DIAGNOSIS — F419 Anxiety disorder, unspecified: Secondary | ICD-10-CM | POA: Diagnosis present

## 2017-02-13 DIAGNOSIS — M1990 Unspecified osteoarthritis, unspecified site: Secondary | ICD-10-CM | POA: Diagnosis present

## 2017-02-13 DIAGNOSIS — E782 Mixed hyperlipidemia: Secondary | ICD-10-CM | POA: Diagnosis not present

## 2017-02-13 DIAGNOSIS — K8020 Calculus of gallbladder without cholecystitis without obstruction: Secondary | ICD-10-CM

## 2017-02-13 DIAGNOSIS — E669 Obesity, unspecified: Secondary | ICD-10-CM | POA: Diagnosis present

## 2017-02-13 DIAGNOSIS — E785 Hyperlipidemia, unspecified: Secondary | ICD-10-CM | POA: Diagnosis present

## 2017-02-13 DIAGNOSIS — K760 Fatty (change of) liver, not elsewhere classified: Secondary | ICD-10-CM | POA: Diagnosis present

## 2017-02-13 DIAGNOSIS — I2510 Atherosclerotic heart disease of native coronary artery without angina pectoris: Secondary | ICD-10-CM | POA: Diagnosis not present

## 2017-02-13 DIAGNOSIS — R748 Abnormal levels of other serum enzymes: Secondary | ICD-10-CM | POA: Diagnosis present

## 2017-02-13 DIAGNOSIS — I519 Heart disease, unspecified: Secondary | ICD-10-CM | POA: Diagnosis not present

## 2017-02-13 DIAGNOSIS — I700 Atherosclerosis of aorta: Secondary | ICD-10-CM | POA: Diagnosis present

## 2017-02-13 DIAGNOSIS — R32 Unspecified urinary incontinence: Secondary | ICD-10-CM | POA: Diagnosis present

## 2017-02-13 DIAGNOSIS — R1013 Epigastric pain: Secondary | ICD-10-CM | POA: Diagnosis present

## 2017-02-13 DIAGNOSIS — F329 Major depressive disorder, single episode, unspecified: Secondary | ICD-10-CM | POA: Diagnosis present

## 2017-02-13 DIAGNOSIS — Z96653 Presence of artificial knee joint, bilateral: Secondary | ICD-10-CM | POA: Diagnosis present

## 2017-02-13 DIAGNOSIS — K5900 Constipation, unspecified: Secondary | ICD-10-CM | POA: Diagnosis present

## 2017-02-13 DIAGNOSIS — I081 Rheumatic disorders of both mitral and tricuspid valves: Secondary | ICD-10-CM | POA: Diagnosis present

## 2017-02-13 DIAGNOSIS — I10 Essential (primary) hypertension: Secondary | ICD-10-CM | POA: Diagnosis not present

## 2017-02-13 DIAGNOSIS — E890 Postprocedural hypothyroidism: Secondary | ICD-10-CM | POA: Diagnosis present

## 2017-02-13 DIAGNOSIS — J42 Unspecified chronic bronchitis: Secondary | ICD-10-CM | POA: Diagnosis present

## 2017-02-13 DIAGNOSIS — I119 Hypertensive heart disease without heart failure: Secondary | ICD-10-CM | POA: Diagnosis not present

## 2017-02-13 DIAGNOSIS — Z6835 Body mass index (BMI) 35.0-35.9, adult: Secondary | ICD-10-CM | POA: Diagnosis not present

## 2017-02-13 DIAGNOSIS — I350 Nonrheumatic aortic (valve) stenosis: Secondary | ICD-10-CM | POA: Diagnosis not present

## 2017-02-13 DIAGNOSIS — I361 Nonrheumatic tricuspid (valve) insufficiency: Secondary | ICD-10-CM

## 2017-02-13 DIAGNOSIS — K8050 Calculus of bile duct without cholangitis or cholecystitis without obstruction: Secondary | ICD-10-CM | POA: Diagnosis not present

## 2017-02-13 DIAGNOSIS — E876 Hypokalemia: Secondary | ICD-10-CM | POA: Diagnosis present

## 2017-02-13 DIAGNOSIS — R079 Chest pain, unspecified: Secondary | ICD-10-CM | POA: Diagnosis not present

## 2017-02-13 LAB — ECHOCARDIOGRAM COMPLETE
AO mean calculated velocity dopler: 233 cm/s
AV Area VTI index: 0.53 cm2/m2
AV Area VTI: 1.17 cm2
AV Area mean vel: 1.15 cm2
AV Mean grad: 27 mmHg
AV Peak grad: 48 mmHg
AV VEL mean LVOT/AV: 0.37
AV area mean vel ind: 0.48 cm2/m2
AV peak Index: 0.49
AV pk vel: 345 cm/s
AV vel: 1.26
Ao pk vel: 0.37 m/s
E decel time: 208 ms
E/e' ratio: 10.42
FS: 34 % (ref 28–44)
Height: 71 in
IVS/LV PW RATIO, ED: 0.94
LA ID, A-P, ES: 28 mm
LA diam end sys: 28 mm
LA diam index: 1.17 cm/m2
LA vol A4C: 94 mL
LA vol index: 34.5 mL/m2
LA vol: 82.5 mL
LV E/e' medial: 10.42
LV E/e'average: 10.42
LV PW d: 16.3 mm — AB (ref 0.6–1.1)
LV dias vol index: 30 mL/m2
LV dias vol: 73 mL (ref 46–106)
LV e' LATERAL: 9.79 cm/s
LV sys vol index: 7 mL/m2
LV sys vol: 17 mL
LVOT SV: 100 mL
LVOT VTI: 32 cm
LVOT area: 3.14 cm2
LVOT diameter: 20 mm
LVOT peak VTI: 0.4 cm
LVOT peak grad rest: 7 mmHg
LVOT peak vel: 129 cm/s
Lateral S' vel: 11.9 cm/s
MV Dec: 208
MV Peak grad: 4 mmHg
MV pk A vel: 100 m/s
MV pk E vel: 102 m/s
RV sys press: 55 mmHg
Reg peak vel: 344 cm/s
Simpson's disk: 77
Stroke v: 56 mL
TAPSE: 25.8 mm
TDI e' lateral: 9.79
TDI e' medial: 7.4
TR max vel: 344 cm/s
VTI: 80 cm
Valve area index: 0.53
Valve area: 1.26 cm2
Weight: 3904 [oz_av]

## 2017-02-13 LAB — COMPREHENSIVE METABOLIC PANEL
ALBUMIN: 3.1 g/dL — AB (ref 3.5–5.0)
ALT: 240 U/L — AB (ref 14–54)
AST: 191 U/L — AB (ref 15–41)
Alkaline Phosphatase: 242 U/L — ABNORMAL HIGH (ref 38–126)
Anion gap: 7 (ref 5–15)
BUN: 10 mg/dL (ref 6–20)
CO2: 29 mmol/L (ref 22–32)
CREATININE: 0.6 mg/dL (ref 0.44–1.00)
Calcium: 8.4 mg/dL — ABNORMAL LOW (ref 8.9–10.3)
Chloride: 105 mmol/L (ref 101–111)
GFR calc non Af Amer: >60 mL/min (ref >60)
GLUCOSE: 139 mg/dL — AB (ref 65–99)
Potassium: 2.9 mmol/L — ABNORMAL LOW (ref 3.5–5.1)
SODIUM: 141 mmol/L (ref 135–145)
Total Bilirubin: 4.8 mg/dL — ABNORMAL HIGH (ref 0.3–1.2)
Total Protein: 6.3 g/dL — ABNORMAL LOW (ref 6.5–8.1)

## 2017-02-13 LAB — CBC
HEMATOCRIT: 37.4 % (ref 36.0–46.0)
Hemoglobin: 12.4 g/dL (ref 12.0–15.0)
MCH: 28.5 pg (ref 26.0–34.0)
MCHC: 33.2 g/dL (ref 30.0–36.0)
MCV: 86 fL (ref 78.0–100.0)
Platelets: 217 10*3/uL (ref 150–400)
RBC: 4.35 MIL/uL (ref 3.87–5.11)
RDW: 14.6 % (ref 11.5–15.5)
WBC: 14.5 10*3/uL — ABNORMAL HIGH (ref 4.0–10.5)

## 2017-02-13 LAB — CBC WITH DIFFERENTIAL/PLATELET
Basophils Absolute: 0 10*3/uL (ref 0.0–0.1)
Basophils Relative: 0 %
EOS ABS: 0 10*3/uL (ref 0.0–0.7)
Eosinophils Relative: 0 %
HCT: 39.9 % (ref 36.0–46.0)
Hemoglobin: 13.3 g/dL (ref 12.0–15.0)
LYMPHS ABS: 0.6 10*3/uL — AB (ref 0.7–4.0)
Lymphocytes Relative: 4 %
MCH: 28.8 pg (ref 26.0–34.0)
MCHC: 33.3 g/dL (ref 30.0–36.0)
MCV: 86.4 fL (ref 78.0–100.0)
MONOS PCT: 7 %
Monocytes Absolute: 0.9 10*3/uL (ref 0.1–1.0)
NEUTROS PCT: 89 %
Neutro Abs: 12.1 10*3/uL — ABNORMAL HIGH (ref 1.7–7.7)
PLATELETS: 221 10*3/uL (ref 150–400)
RBC: 4.62 MIL/uL (ref 3.87–5.11)
RDW: 14.8 % (ref 11.5–15.5)
WBC: 13.7 10*3/uL — ABNORMAL HIGH (ref 4.0–10.5)

## 2017-02-13 LAB — TROPONIN I
Troponin I: 0.26 ng/mL (ref <0.03)
Troponin I: 0.36 ng/mL
Troponin I: 0.43 ng/mL (ref <0.03)
Troponin I: 0.45 ng/mL

## 2017-02-13 MED ORDER — ALBUTEROL SULFATE (2.5 MG/3ML) 0.083% IN NEBU
2.5000 mg | INHALATION_SOLUTION | Freq: Three times a day (TID) | RESPIRATORY_TRACT | Status: DC
  Administered 2017-02-13 (×2): 2.5 mg via RESPIRATORY_TRACT
  Filled 2017-02-13 (×3): qty 3

## 2017-02-13 MED ORDER — POTASSIUM CHLORIDE 20 MEQ PO PACK
20.0000 meq | PACK | ORAL | Status: AC
  Administered 2017-02-13 (×2): 20 meq via ORAL
  Filled 2017-02-13: qty 1

## 2017-02-13 MED ORDER — PIPERACILLIN-TAZOBACTAM 3.375 G IVPB
3.3750 g | Freq: Three times a day (TID) | INTRAVENOUS | Status: DC
  Administered 2017-02-13 – 2017-02-15 (×4): 3.375 g via INTRAVENOUS
  Filled 2017-02-13 (×8): qty 50

## 2017-02-13 MED ORDER — HYDROMORPHONE HCL 1 MG/ML IJ SOLN
0.5000 mg | INTRAMUSCULAR | Status: DC | PRN
  Administered 2017-02-13 – 2017-02-14 (×2): 0.5 mg via INTRAVENOUS
  Filled 2017-02-13 (×2): qty 0.5

## 2017-02-13 NOTE — Progress Notes (Signed)
*  PRELIMINARY RESULTS* Echocardiogram 2D Echocardiogram has been performed.  Stacey DrainWhite, Farryn Linares J 02/13/2017, 12:06 PM

## 2017-02-13 NOTE — Progress Notes (Signed)
MD informed of troponin levels today, no further orders at this time.

## 2017-02-13 NOTE — Progress Notes (Signed)
Patient c/o chest pain rated a 3 was given nitrostat 0.4 SL with relief, EKG per protocol and MD informed.

## 2017-02-13 NOTE — Progress Notes (Addendum)
Subjective: She says her nausea is better. She had some chest discomfort again this morning which went away with nitroglycerin. Cardiac evaluation done in December of last year was low risk. She had CT of the chest abdomen and pelvis done yesterday which I have personally reviewed. CT chest does not show pulmonary emboli. CT of the abdomen shows that her gallbladder is thickened and she does have a stone in the gallbladder. No stones in the common bile duct. Troponin level is mildly elevated  Objective: Vital signs in last 24 hours: Temp:  [98.1 F (36.7 C)-99.7 F (37.6 C)] 98.2 F (36.8 C) (05/20 0620) Pulse Rate:  [70-90] 70 (05/20 0736) Resp:  [18-20] 18 (05/20 0620) BP: (132-165)/(53-106) 165/53 (05/20 0736) SpO2:  [88 %-98 %] 95 % (05/20 0736) Weight change:  Last BM Date: 02/10/17  Intake/Output from previous day: 05/19 0701 - 05/20 0700 In: 451.7 [P.O.:240; I.V.:61.7; IV Piggyback:150] Out: 1000 [Urine:1000]  PHYSICAL EXAM General appearance: alert, cooperative and mild distress Resp: clear to auscultation bilaterally Cardio: regular rate and rhythm, S1, S2 normal, no murmur, click, rub or gallop GI: soft, non-tender; bowel sounds normal; no masses,  no organomegaly Extremities: extremities normal, atraumatic, no cyanosis or edema Skin warm and dry  Lab Results:  Results for orders placed or performed during the hospital encounter of 02/12/17 (from the past 48 hour(s))  CBC with Differential/Platelet     Status: Abnormal   Collection Time: 02/12/17  1:54 AM  Result Value Ref Range   WBC 15.0 (H) 4.0 - 10.5 K/uL   RBC 4.90 3.87 - 5.11 MIL/uL   Hemoglobin 14.1 12.0 - 15.0 g/dL   HCT 42.8 36.0 - 46.0 %   MCV 87.3 78.0 - 100.0 fL   MCH 28.8 26.0 - 34.0 pg   MCHC 32.9 30.0 - 36.0 g/dL   RDW 14.3 11.5 - 15.5 %   Platelets 249 150 - 400 K/uL   Neutrophils Relative % 87 %   Neutro Abs 13.1 (H) 1.7 - 7.7 K/uL   Lymphocytes Relative 7 %   Lymphs Abs 1.0 0.7 - 4.0 K/uL    Monocytes Relative 5 %   Monocytes Absolute 0.8 0.1 - 1.0 K/uL   Eosinophils Relative 1 %   Eosinophils Absolute 0.1 0.0 - 0.7 K/uL   Basophils Relative 0 %   Basophils Absolute 0.0 0.0 - 0.1 K/uL  Basic metabolic panel     Status: Abnormal   Collection Time: 02/12/17  1:54 AM  Result Value Ref Range   Sodium 141 135 - 145 mmol/L   Potassium 3.0 (L) 3.5 - 5.1 mmol/L   Chloride 103 101 - 111 mmol/L   CO2 29 22 - 32 mmol/L   Glucose, Bld 159 (H) 65 - 99 mg/dL   BUN 11 6 - 20 mg/dL   Creatinine, Ser 0.62 0.44 - 1.00 mg/dL   Calcium 8.7 (L) 8.9 - 10.3 mg/dL   GFR calc non Af Amer >60 >60 mL/min   GFR calc Af Amer >60 >60 mL/min    Comment: (NOTE) The eGFR has been calculated using the CKD EPI equation. This calculation has not been validated in all clinical situations. eGFR's persistently <60 mL/min signify possible Chronic Kidney Disease.    Anion gap 9 5 - 15  Hepatic function panel     Status: Abnormal   Collection Time: 02/12/17  1:54 AM  Result Value Ref Range   Total Protein 6.8 6.5 - 8.1 g/dL   Albumin 3.6 3.5 -  5.0 g/dL   AST 414 (H) 15 - 41 U/L   ALT 315 (H) 14 - 54 U/L   Alkaline Phosphatase 303 (H) 38 - 126 U/L   Total Bilirubin 2.9 (H) 0.3 - 1.2 mg/dL   Bilirubin, Direct 1.8 (H) 0.1 - 0.5 mg/dL   Indirect Bilirubin 1.1 (H) 0.3 - 0.9 mg/dL  Lipase, blood     Status: None   Collection Time: 02/12/17  1:54 AM  Result Value Ref Range   Lipase 41 11 - 51 U/L  Magnesium     Status: None   Collection Time: 02/12/17  1:54 AM  Result Value Ref Range   Magnesium 1.9 1.7 - 2.4 mg/dL  Phosphorus     Status: None   Collection Time: 02/12/17  1:54 AM  Result Value Ref Range   Phosphorus 3.1 2.5 - 4.6 mg/dL  I-stat troponin, ED     Status: None   Collection Time: 02/12/17  2:16 AM  Result Value Ref Range   Troponin i, poc 0.01 0.00 - 0.08 ng/mL   Comment 3            Comment: Due to the release kinetics of cTnI, a negative result within the first hours of the onset of  symptoms does not rule out myocardial infarction with certainty. If myocardial infarction is still suspected, repeat the test at appropriate intervals.   Urinalysis, Routine w reflex microscopic     Status: Abnormal   Collection Time: 02/12/17  4:26 AM  Result Value Ref Range   Color, Urine AMBER (A) YELLOW    Comment: BIOCHEMICALS MAY BE AFFECTED BY COLOR   APPearance CLEAR CLEAR   Specific Gravity, Urine 1.017 1.005 - 1.030   pH 6.0 5.0 - 8.0   Glucose, UA NEGATIVE NEGATIVE mg/dL   Hgb urine dipstick NEGATIVE NEGATIVE   Bilirubin Urine SMALL (A) NEGATIVE   Ketones, ur NEGATIVE NEGATIVE mg/dL   Protein, ur 30 (A) NEGATIVE mg/dL   Nitrite NEGATIVE NEGATIVE   Leukocytes, UA NEGATIVE NEGATIVE   RBC / HPF 6-30 0 - 5 RBC/hpf   WBC, UA 0-5 0 - 5 WBC/hpf   Bacteria, UA NONE SEEN NONE SEEN   Squamous Epithelial / LPF 0-5 (A) NONE SEEN   Mucous PRESENT   CBC WITH DIFFERENTIAL     Status: Abnormal   Collection Time: 02/12/17  5:51 AM  Result Value Ref Range   WBC 13.0 (H) 4.0 - 10.5 K/uL   RBC 5.00 3.87 - 5.11 MIL/uL   Hemoglobin 14.1 12.0 - 15.0 g/dL   HCT 43.2 36.0 - 46.0 %   MCV 86.4 78.0 - 100.0 fL   MCH 28.2 26.0 - 34.0 pg   MCHC 32.6 30.0 - 36.0 g/dL   RDW 14.4 11.5 - 15.5 %   Platelets 236 150 - 400 K/uL   Neutrophils Relative % 90 %   Neutro Abs 11.6 (H) 1.7 - 7.7 K/uL   Lymphocytes Relative 4 %   Lymphs Abs 0.5 (L) 0.7 - 4.0 K/uL   Monocytes Relative 6 %   Monocytes Absolute 0.8 0.1 - 1.0 K/uL   Eosinophils Relative 0 %   Eosinophils Absolute 0.0 0.0 - 0.7 K/uL   Basophils Relative 0 %   Basophils Absolute 0.0 0.0 - 0.1 K/uL  Comprehensive metabolic panel     Status: Abnormal   Collection Time: 02/12/17  5:51 AM  Result Value Ref Range   Sodium 139 135 - 145 mmol/L   Potassium 3.4 (L)  3.5 - 5.1 mmol/L   Chloride 103 101 - 111 mmol/L   CO2 27 22 - 32 mmol/L   Glucose, Bld 148 (H) 65 - 99 mg/dL   BUN 10 6 - 20 mg/dL   Creatinine, Ser 0.56 0.44 - 1.00 mg/dL    Calcium 8.5 (L) 8.9 - 10.3 mg/dL   Total Protein 6.7 6.5 - 8.1 g/dL   Albumin 3.5 3.5 - 5.0 g/dL   AST 421 (H) 15 - 41 U/L   ALT 333 (H) 14 - 54 U/L   Alkaline Phosphatase 304 (H) 38 - 126 U/L   Total Bilirubin 3.2 (H) 0.3 - 1.2 mg/dL   GFR calc non Af Amer >60 >60 mL/min   GFR calc Af Amer >60 >60 mL/min    Comment: (NOTE) The eGFR has been calculated using the CKD EPI equation. This calculation has not been validated in all clinical situations. eGFR's persistently <60 mL/min signify possible Chronic Kidney Disease.    Anion gap 9 5 - 15  Brain natriuretic peptide     Status: Abnormal   Collection Time: 02/12/17  5:51 AM  Result Value Ref Range   B Natriuretic Peptide 137.0 (H) 0.0 - 100.0 pg/mL  Troponin I (q 6hr x 3)     Status: Abnormal   Collection Time: 02/12/17  7:58 AM  Result Value Ref Range   Troponin I 0.03 (HH) <0.03 ng/mL    Comment: CRITICAL RESULT CALLED TO, READ BACK BY AND VERIFIED WITH: [facility] AT 5631 ON 02/12/2017 BY MOSLEY,J   Protime-INR     Status: None   Collection Time: 02/12/17  7:58 AM  Result Value Ref Range   Prothrombin Time 12.7 11.4 - 15.2 seconds   INR 0.95   APTT     Status: None   Collection Time: 02/12/17  7:58 AM  Result Value Ref Range   aPTT 32 24 - 36 seconds  Bilirubin, direct     Status: Abnormal   Collection Time: 02/12/17  7:58 AM  Result Value Ref Range   Bilirubin, Direct 2.5 (H) 0.1 - 0.5 mg/dL  Troponin I (q 6hr x 3)     Status: Abnormal   Collection Time: 02/12/17  1:42 PM  Result Value Ref Range   Troponin I 0.27 (HH) <0.03 ng/mL    Comment: CRITICAL VALUE NOTED.  VALUE IS CONSISTENT WITH PREVIOUSLY REPORTED AND CALLED VALUE.  D-dimer, quantitative (not at [facility])     Status: Abnormal   Collection Time: 02/12/17  1:42 PM  Result Value Ref Range   D-Dimer, Quant 4.14 (H) 0.00 - 0.50 ug/mL-FEU    Comment: (NOTE) At the manufacturer cut-off of 0.50 ug/mL FEU, this assay has been documented to exclude PE with a  sensitivity and negative predictive value of 97 to 99%.  At this time, this assay has not been approved by the FDA to exclude DVT/VTE. Results should be correlated with clinical presentation.   Troponin I     Status: Abnormal   Collection Time: 02/12/17  7:08 PM  Result Value Ref Range   Troponin I 0.33 (HH) <0.03 ng/mL    Comment: CRITICAL RESULT CALLED TO, READ BACK BY AND VERIFIED WITH:  HANDY,T @ 2002 ON 02/12/17 BY JUW   CBC     Status: Abnormal   Collection Time: 02/13/17  1:21 AM  Result Value Ref Range   WBC 14.5 (H) 4.0 - 10.5 K/uL   RBC 4.35 3.87 - 5.11 MIL/uL   Hemoglobin 12.4 12.0 -  15.0 g/dL   HCT 37.4 36.0 - 46.0 %   MCV 86.0 78.0 - 100.0 fL   MCH 28.5 26.0 - 34.0 pg   MCHC 33.2 30.0 - 36.0 g/dL   RDW 14.6 11.5 - 15.5 %   Platelets 217 150 - 400 K/uL  Troponin I     Status: Abnormal   Collection Time: 02/13/17  1:21 AM  Result Value Ref Range   Troponin I 0.26 (HH) <0.03 ng/mL    Comment: CRITICAL VALUE NOTED.  VALUE IS CONSISTENT WITH PREVIOUSLY REPORTED AND CALLED VALUE.    ABGS No results for input(s): PHART, PO2ART, TCO2, HCO3 in the last 72 hours.  Invalid input(s): PCO2 CULTURES No results found for this or any previous visit (from the past 240 hour(s)). Studies/Results: Dg Chest 2 View  Result Date: 02/12/2017 CLINICAL DATA:  Central chest pain and shortness of breath. EXAM: CHEST  2 VIEW COMPARISON:  Radiograph 08/30/2016 FINDINGS: Lower lung volumes from prior exam leading to bronchovascular crowding. Unchanged cardiomegaly and mediastinal contours. Question of vascular congestion. No consolidation, pleural fluid or pneumothorax. Chronic degenerative change of both shoulders. IMPRESSION: Cardiomegaly. Low lung volumes with bronchovascular crowding, difficult to exclude vascular congestion. Electronically Signed   By: Jeb Levering M.D.   On: 02/12/2017 02:58   Ct Angio Chest Pe W Or Wo Contrast  Result Date: 02/12/2017 CLINICAL DATA:  Patient  complains of chest pain beginning last night. EXAM: CT ANGIOGRAPHY CHEST CT ABDOMEN WITH CONTRAST TECHNIQUE: Multidetector CT imaging of the chest was performed using the standard protocol during bolus administration of intravenous contrast. Multiplanar CT image reconstructions and MIPs were obtained to evaluate the vascular anatomy. Multidetector CT imaging of the abdomen and pelvis was performed using the standard protocol during bolus administration of intravenous contrast. CONTRAST:  27m ISOVUE-300 IOPAMIDOL (ISOVUE-300) INJECTION 61% COMPARISON:  Compared to CTA of the chest, abdomen, and pelvis August 30, 2016 FINDINGS: CTA CHEST FINDINGS Cardiovascular: Cardiomegaly identified. Coronary artery calcifications are noted. Atherosclerotic changes associated with the thoracic aorta. No aneurysm or dissection. No pulmonary emboli. Mediastinum/Nodes: There is a moderate to large hiatal hernia. Visualized esophagus otherwise normal. No adenopathy. No effusion. Lungs/Pleura: Central airways are normal. No pneumothorax. A nodule in the lateral right lung base on series 9, image 56 is stable. Atelectasis adjacent to the left hemidiaphragm. No suspicious nodules, masses, or infiltrates otherwise identified. Musculoskeletal: See below Review of the MIP images confirms the above findings. CT ABDOMEN FINDINGS Hepatobiliary: There is a stone in the gallbladder. There is anterior wall thickening associated with the gallbladder measuring up to 6 mm, more prominent in the interval. No adjacent fat stranding. No pericholecystic fluid identified. No focal liver lesions. The portal vein is patent. Pancreas: Unremarkable. No pancreatic ductal dilatation or surrounding inflammatory changes. Spleen: Normal in size without focal abnormality. Adrenals/Urinary Tract: A small nodule in the right adrenal gland is likely an adenoma and is unchanged in the interval. It is too small to characterize however. The kidneys are normal in  appearance as are the proximal ureters. Stomach/Bowel: There is a large hiatal hernia. Visualized stomach, small bowel, and colon are otherwise normal. Vascular/Lymphatic: Atherosclerotic changes seen in the tortuous non aneurysmal aorta. No adenopathy. Other: No abdominal wall hernia or abnormality. No abdominopelvic ascites. Musculoskeletal: No acute or significant osseous findings. Review of the MIP images confirms the above findings. IMPRESSION: 1. No thoracic aortic aneurysm or dissection. No pulmonary emboli identified. 2. No acute abnormalities in the chest. 3. Cholelithiasis. Gallbladder wall thickening  anteriorly is more prominent prominent in the interval. Acute cholecystitis is not excluded on this study. An ultrasound could further evaluate if clinically warranted. Electronically Signed   By: Dorise Bullion III M.D   On: 02/12/2017 17:41   Ct Abdomen W Contrast  Result Date: 02/12/2017 CLINICAL DATA:  Patient complains of chest pain beginning last night. EXAM: CT ANGIOGRAPHY CHEST CT ABDOMEN WITH CONTRAST TECHNIQUE: Multidetector CT imaging of the chest was performed using the standard protocol during bolus administration of intravenous contrast. Multiplanar CT image reconstructions and MIPs were obtained to evaluate the vascular anatomy. Multidetector CT imaging of the abdomen and pelvis was performed using the standard protocol during bolus administration of intravenous contrast. CONTRAST:  50m ISOVUE-300 IOPAMIDOL (ISOVUE-300) INJECTION 61% COMPARISON:  Compared to CTA of the chest, abdomen, and pelvis August 30, 2016 FINDINGS: CTA CHEST FINDINGS Cardiovascular: Cardiomegaly identified. Coronary artery calcifications are noted. Atherosclerotic changes associated with the thoracic aorta. No aneurysm or dissection. No pulmonary emboli. Mediastinum/Nodes: There is a moderate to large hiatal hernia. Visualized esophagus otherwise normal. No adenopathy. No effusion. Lungs/Pleura: Central airways are  normal. No pneumothorax. A nodule in the lateral right lung base on series 9, image 56 is stable. Atelectasis adjacent to the left hemidiaphragm. No suspicious nodules, masses, or infiltrates otherwise identified. Musculoskeletal: See below Review of the MIP images confirms the above findings. CT ABDOMEN FINDINGS Hepatobiliary: There is a stone in the gallbladder. There is anterior wall thickening associated with the gallbladder measuring up to 6 mm, more prominent in the interval. No adjacent fat stranding. No pericholecystic fluid identified. No focal liver lesions. The portal vein is patent. Pancreas: Unremarkable. No pancreatic ductal dilatation or surrounding inflammatory changes. Spleen: Normal in size without focal abnormality. Adrenals/Urinary Tract: A small nodule in the right adrenal gland is likely an adenoma and is unchanged in the interval. It is too small to characterize however. The kidneys are normal in appearance as are the proximal ureters. Stomach/Bowel: There is a large hiatal hernia. Visualized stomach, small bowel, and colon are otherwise normal. Vascular/Lymphatic: Atherosclerotic changes seen in the tortuous non aneurysmal aorta. No adenopathy. Other: No abdominal wall hernia or abnormality. No abdominopelvic ascites. Musculoskeletal: No acute or significant osseous findings. Review of the MIP images confirms the above findings. IMPRESSION: 1. No thoracic aortic aneurysm or dissection. No pulmonary emboli identified. 2. No acute abnormalities in the chest. 3. Cholelithiasis. Gallbladder wall thickening anteriorly is more prominent prominent in the interval. Acute cholecystitis is not excluded on this study. An ultrasound could further evaluate if clinically warranted. Electronically Signed   By: DDorise BullionIII M.D   On: 02/12/2017 17:41   [place]dom[place] Limited Ruq  Result Date: 02/12/2017 CLINICAL DATA:  Hyperbilirubinemia.  Nausea and vomiting. EXAM: [place] LIMITED - RIGHT UPPER  QUADRANT COMPARISON:  None. FINDINGS: Gallbladder: No gallstones. No sludge. Wall appears mildly thickened where it abuts the liver, measuring 5 mm. No pericholecystic fluid. Common bile duct: Diameter: 4.7 mm, not well visualized. Liver: Liver poorly visualized. Increased parenchymal echogenicity. No liver mass or focal lesion. IMPRESSION: 1. Exam limited by the patient's body habitus. 2. No convincing acute abnormality. No gallstones. No bile duct dilation. 3. Increased echogenicity of the liver consistent with fatty infiltration. Electronically Signed   By: DLajean ManesM.D.   On: 02/12/2017 11:35    Medications:  Prior to Admission:  Prescriptions Prior to Admission  Medication Sig Dispense Refill Last Dose  . ALPRAZolam (XANAX) 0.25 MG tablet Take 1 tablet  by mouth 4 (four) times daily.  4 02/11/2017 at Unknown time  . aspirin EC 81 MG tablet Take 81 mg by mouth daily.   02/11/2017 at Unknown time  . furosemide (LASIX) 40 MG tablet Take 40 mg by mouth.   Past Week at Unknown time  . ibuprofen (ADVIL,MOTRIN) 200 MG tablet Take 600 mg by mouth daily as needed for moderate pain.   02/11/2017 at Unknown time  . levothyroxine (SYNTHROID, LEVOTHROID) 150 MCG tablet Take 150 mcg by mouth every morning.     02/11/2017 at Unknown time  . omeprazole (PRILOSEC) 20 MG capsule Take 20 mg by mouth daily.   02/11/2017 at Unknown time  . PARoxetine (PAXIL) 20 MG tablet Take 20 mg by mouth every evening.     02/11/2017 at Unknown time  . tolterodine (DETROL LA) 4 MG 24 hr capsule Take 4 mg by mouth daily.  10 02/11/2017 at Unknown time   Scheduled: . albuterol  2.5 mg Nebulization TID  . aspirin  325 mg Oral Daily  . darifenacin  15 mg Oral Daily  . levothyroxine  150 mcg Oral QAC breakfast  . PARoxetine  20 mg Oral QPM   Continuous: . 0.9 % NaCl with KCl 40 mEq / L 50 mL/hr at 02/12/17 1502  . famotidine (PEPCID) IV Stopped (02/12/17 2116)   YJG:[id number], LORazepam, ondansetron (ZOFRAN)  IV  Assesment:She was admitted to the hospital with chest pain. She was discovered to have elevated liver enzymes and she had significant nausea and vomiting. She feels better but had some more chest pain that she rated at a 3 this morning. Nitroglycerin resulted. Her EKG which I personally reviewed does not show any ischemic changes. She had CT of the chest and abdomen both which I personally reviewed and showed that she has gallstones and thickening of the gallbladder suggestive of acute cholecystitis. Principal Problem:   Chest pain Active Problems:   Hypothyroidism   Hyperlipidemia   Moderate aortic stenosis   Hypertensive cardiovascular disease   Depression   Anxiety   Hyperbilirubinemia   Gallstones   Hypokalemia   Biliary colic    Plan: Discuss with cardiology on-call. Full cardiology consult tomorrow. Continue treatments. Discussed with Dr. Sallyanne Kuster. He has reviewed the chart and look at EKGs and he agrees with my assessment that this is likely not acute coronary syndrome. No further treatment at this point except what we are already doing. Cardiology consultation in the morning echocardiogram today.   LOS: 0 days   Yoshimi Sarr L 02/13/2017, 9:53 AM

## 2017-02-13 NOTE — Progress Notes (Signed)
Subjective: Patient had another episode of chest pain yesterday evening requiring nitroglycerin. She denies any significant abdominal pain.  Objective: Vital signs in last 24 hours: Temp:  [98.1 F (36.7 C)-99.7 F (37.6 C)] 98.2 F (36.8 C) (05/20 0620) Pulse Rate:  [70-90] 70 (05/20 0736) Resp:  [18-20] 18 (05/20 0620) BP: (132-165)/(53-106) 165/53 (05/20 0736) SpO2:  [88 %-98 %] 95 % (05/20 0736) Last BM Date: 02/10/17  Intake/Output from previous day: 05/19 0701 - 05/20 0700 In: 451.7 [P.O.:240; I.V.:61.7; IV Piggyback:150] Out: 1000 [Urine:1000] Intake/Output this shift: Total I/O In: 240 [P.O.:240] Out: 200 [Urine:200]  General appearance: alert, cooperative and no distress Resp: clear to auscultation bilaterally Cardio: regular rate and rhythm, S1, S2 normal, no murmur, click, rub or gallop GI: Soft, nontender, nondistended.  Lab Results:   Recent Labs  02/12/17 0551 02/13/17 0121  WBC 13.0* 14.5*  HGB 14.1 12.4  HCT 43.2 37.4  PLT 236 217   BMET  Recent Labs  02/12/17 0154 02/12/17 0551  NA 141 139  K 3.0* 3.4*  CL 103 103  CO2 29 27  GLUCOSE 159* 148*  BUN 11 10  CREATININE 0.62 0.56  CALCIUM 8.7* 8.5*   PT/INR  Recent Labs  02/12/17 0758  LABPROT 12.7  INR 0.95    Studies/Results: Dg Chest 2 View  Result Date: 02/12/2017 CLINICAL DATA:  Central chest pain and shortness of breath. EXAM: CHEST  2 VIEW COMPARISON:  Radiograph 08/30/2016 FINDINGS: Lower lung volumes from prior exam leading to bronchovascular crowding. Unchanged cardiomegaly and mediastinal contours. Question of vascular congestion. No consolidation, pleural fluid or pneumothorax. Chronic degenerative change of both shoulders. IMPRESSION: Cardiomegaly. Low lung volumes with bronchovascular crowding, difficult to exclude vascular congestion. Electronically Signed   By: Rubye Oaks M.D.   On: 02/12/2017 02:58   Ct Angio Chest Pe W Or Wo Contrast  Result Date:  02/12/2017 CLINICAL DATA:  Patient complains of chest pain beginning last night. EXAM: CT ANGIOGRAPHY CHEST CT ABDOMEN WITH CONTRAST TECHNIQUE: Multidetector CT imaging of the chest was performed using the standard protocol during bolus administration of intravenous contrast. Multiplanar CT image reconstructions and MIPs were obtained to evaluate the vascular anatomy. Multidetector CT imaging of the abdomen and pelvis was performed using the standard protocol during bolus administration of intravenous contrast. CONTRAST:  30mL ISOVUE-300 IOPAMIDOL (ISOVUE-300) INJECTION 61% COMPARISON:  Compared to CTA of the chest, abdomen, and pelvis August 30, 2016 FINDINGS: CTA CHEST FINDINGS Cardiovascular: Cardiomegaly identified. Coronary artery calcifications are noted. Atherosclerotic changes associated with the thoracic aorta. No aneurysm or dissection. No pulmonary emboli. Mediastinum/Nodes: There is a moderate to large hiatal hernia. Visualized esophagus otherwise normal. No adenopathy. No effusion. Lungs/Pleura: Central airways are normal. No pneumothorax. A nodule in the lateral right lung base on series 9, image 56 is stable. Atelectasis adjacent to the left hemidiaphragm. No suspicious nodules, masses, or infiltrates otherwise identified. Musculoskeletal: See below Review of the MIP images confirms the above findings. CT ABDOMEN FINDINGS Hepatobiliary: There is a stone in the gallbladder. There is anterior wall thickening associated with the gallbladder measuring up to 6 mm, more prominent in the interval. No adjacent fat stranding. No pericholecystic fluid identified. No focal liver lesions. The portal vein is patent. Pancreas: Unremarkable. No pancreatic ductal dilatation or surrounding inflammatory changes. Spleen: Normal in size without focal abnormality. Adrenals/Urinary Tract: A small nodule in the right adrenal gland is likely an adenoma and is unchanged in the interval. It is too small to characterize  however.  The kidneys are normal in appearance as are the proximal ureters. Stomach/Bowel: There is a large hiatal hernia. Visualized stomach, small bowel, and colon are otherwise normal. Vascular/Lymphatic: Atherosclerotic changes seen in the tortuous non aneurysmal aorta. No adenopathy. Other: No abdominal wall hernia or abnormality. No abdominopelvic ascites. Musculoskeletal: No acute or significant osseous findings. Review of the MIP images confirms the above findings. IMPRESSION: 1. No thoracic aortic aneurysm or dissection. No pulmonary emboli identified. 2. No acute abnormalities in the chest. 3. Cholelithiasis. Gallbladder wall thickening anteriorly is more prominent prominent in the interval. Acute cholecystitis is not excluded on this study. An ultrasound could further evaluate if clinically warranted. Electronically Signed   By: Gerome Sam III M.D   On: 02/12/2017 17:41   Ct Abdomen W Contrast  Result Date: 02/12/2017 CLINICAL DATA:  Patient complains of chest pain beginning last night. EXAM: CT ANGIOGRAPHY CHEST CT ABDOMEN WITH CONTRAST TECHNIQUE: Multidetector CT imaging of the chest was performed using the standard protocol during bolus administration of intravenous contrast. Multiplanar CT image reconstructions and MIPs were obtained to evaluate the vascular anatomy. Multidetector CT imaging of the abdomen and pelvis was performed using the standard protocol during bolus administration of intravenous contrast. CONTRAST:  30mL ISOVUE-300 IOPAMIDOL (ISOVUE-300) INJECTION 61% COMPARISON:  Compared to CTA of the chest, abdomen, and pelvis August 30, 2016 FINDINGS: CTA CHEST FINDINGS Cardiovascular: Cardiomegaly identified. Coronary artery calcifications are noted. Atherosclerotic changes associated with the thoracic aorta. No aneurysm or dissection. No pulmonary emboli. Mediastinum/Nodes: There is a moderate to large hiatal hernia. Visualized esophagus otherwise normal. No adenopathy. No  effusion. Lungs/Pleura: Central airways are normal. No pneumothorax. A nodule in the lateral right lung base on series 9, image 56 is stable. Atelectasis adjacent to the left hemidiaphragm. No suspicious nodules, masses, or infiltrates otherwise identified. Musculoskeletal: See below Review of the MIP images confirms the above findings. CT ABDOMEN FINDINGS Hepatobiliary: There is a stone in the gallbladder. There is anterior wall thickening associated with the gallbladder measuring up to 6 mm, more prominent in the interval. No adjacent fat stranding. No pericholecystic fluid identified. No focal liver lesions. The portal vein is patent. Pancreas: Unremarkable. No pancreatic ductal dilatation or surrounding inflammatory changes. Spleen: Normal in size without focal abnormality. Adrenals/Urinary Tract: A small nodule in the right adrenal gland is likely an adenoma and is unchanged in the interval. It is too small to characterize however. The kidneys are normal in appearance as are the proximal ureters. Stomach/Bowel: There is a large hiatal hernia. Visualized stomach, small bowel, and colon are otherwise normal. Vascular/Lymphatic: Atherosclerotic changes seen in the tortuous non aneurysmal aorta. No adenopathy. Other: No abdominal wall hernia or abnormality. No abdominopelvic ascites. Musculoskeletal: No acute or significant osseous findings. Review of the MIP images confirms the above findings. IMPRESSION: 1. No thoracic aortic aneurysm or dissection. No pulmonary emboli identified. 2. No acute abnormalities in the chest. 3. Cholelithiasis. Gallbladder wall thickening anteriorly is more prominent prominent in the interval. Acute cholecystitis is not excluded on this study. An ultrasound could further evaluate if clinically warranted. Electronically Signed   By: Gerome Sam III M.D   On: 02/12/2017 17:41   US Abdomen Limited Ruq  Result Date: 02/12/2017 CLINICAL DATA:  Hyperbilirubinemia.  Nausea and  vomiting. EXAM: US ABDOMEN LIMITED - RIGHT UPPER QUADRANT COMPARISON:  None. FINDINGS: Gallbladder: No gallstones. No sludge. Wall appears mildly thickened where it abuts the liver, measuring 5 mm. No pericholecystic fluid. Common bile duct: Diameter: 4.7 mm, not  well visualized. Liver: Liver poorly visualized. Increased parenchymal echogenicity. No liver mass or focal lesion. IMPRESSION: 1. Exam limited by the patient's body habitus. 2. No convincing acute abnormality. No gallstones. No bile duct dilation. 3. Increased echogenicity of the liver consistent with fatty infiltration. Electronically Signed   By: Amie Portlandavid  Ormond M.D.   On: 02/12/2017 11:35    Anti-infectives: Anti-infectives    Start     Dose/Rate Route Frequency Ordered Stop   02/12/17 0600  piperacillin-tazobactam (ZOSYN) IVPB 3.375 g  Status:  Discontinued     3.375 g 100 mL/hr over 30 Minutes Intravenous Every 8 hours 02/12/17 0404 02/12/17 0422   02/12/17 0430  piperacillin-tazobactam (ZOSYN) IVPB 3.375 g     3.375 g 12.5 mL/hr over 240 Minutes Intravenous  Once 02/12/17 0422 02/12/17 0902   02/12/17 0315  cefTRIAXone (ROCEPHIN) 2 g in dextrose 5 % 50 mL IVPB     2 g 100 mL/hr over 30 Minutes Intravenous  Once 02/12/17 0303 02/12/17 0343      Assessment/Plan: Impression: Cholelithiasis, cholecystitis, chest pain, elevated troponin. Plan: Cardiac workup ongoing. Echo today. MRCP tomorrow. Check liver enzyme tests. Continue IV Zosyn.  LOS: 0 days    Franky MachoMark Nikai Quest 02/13/2017

## 2017-02-13 NOTE — Progress Notes (Signed)
Patient ID: [name], female   DOB: [birth year], 83 y.o.   MRN: [id number]   Assessment/Plan: ADMITTED WITH CHEST PAIN/SOB. CHEST PAIN RELIVED BY NTG SL. CT CHEST/ABD: ABNL GB WALL, GALLTONES, NO PE OR CBD STONE. ECHO PENDING.TROPONINS ELEVATED. NEEDS SOMETHING FOR CONSTIPATION.  PLAN: 1. MRCP MAY 21 2. LINZESS 72 MCG DAILY WITH A MEAL. 3. CMP IN AM    Subjective: Since I last evaluated the patient SHE DENIES NAUSEA, ABDOMINAL PAIN, OR VOMITING. STILL FEEL SLIKE SHE CAN'T TAKE A DEEP BREATH. COMPLETE HER ECHO.   Objective: Vital signs in last 24 hours: Vitals:   02/13/17 0730 02/13/17 0736  BP: (!) 159/62 (!) 165/53  Pulse: 73 70  Resp:    Temp:     General appearance: alert, cooperative and no distress Resp: diminished breath sounds bibasilar and bilaterally Cardio: regular rate and rhythm GI: soft, non-tender; bowel sounds normal;   Lab Results: K 2.9 Cr 0.6 ALK PHOS 242 AST 191 ALT 240 T BILI 4.8   Studies/Results: Ct Angio Chest Pe W Or Wo Contrast  Result Date: 02/12/2017 CLINICAL DATA:  Patient complains of chest pain beginning last night. EXAM: CT ANGIOGRAPHY CHEST CT ABDOMEN WITH CONTRAST TECHNIQUE: Multidetector CT imaging of the chest was performed using the standard protocol during bolus administration of intravenous contrast. Multiplanar CT image reconstructions and MIPs were obtained to evaluate the vascular anatomy. Multidetector CT imaging of the abdomen and pelvis was performed using the standard protocol during bolus administration of intravenous contrast. CONTRAST:  67m ISOVUE-300 IOPAMIDOL (ISOVUE-300) INJECTION 61% COMPARISON:  Compared to CTA of the chest, abdomen, and pelvis August 30, 2016 FINDINGS: CTA CHEST FINDINGS Cardiovascular: Cardiomegaly identified. Coronary artery calcifications are noted. Atherosclerotic changes associated with the thoracic aorta. No aneurysm or dissection. No pulmonary emboli. Mediastinum/Nodes: There is a moderate to  large hiatal hernia. Visualized esophagus otherwise normal. No adenopathy. No effusion. Lungs/Pleura: Central airways are normal. No pneumothorax. A nodule in the lateral right lung base on series 9, image 56 is stable. Atelectasis adjacent to the left hemidiaphragm. No suspicious nodules, masses, or infiltrates otherwise identified. Musculoskeletal: See below Review of the MIP images confirms the above findings. CT ABDOMEN FINDINGS Hepatobiliary: There is a stone in the gallbladder. There is anterior wall thickening associated with the gallbladder measuring up to 6 mm, more prominent in the interval. No adjacent fat stranding. No pericholecystic fluid identified. No focal liver lesions. The portal vein is patent. Pancreas: Unremarkable. No pancreatic ductal dilatation or surrounding inflammatory changes. Spleen: Normal in size without focal abnormality. Adrenals/Urinary Tract: A small nodule in the right adrenal gland is likely an adenoma and is unchanged in the interval. It is too small to characterize however. The kidneys are normal in appearance as are the proximal ureters. Stomach/Bowel: There is a large hiatal hernia. Visualized stomach, small bowel, and colon are otherwise normal. Vascular/Lymphatic: Atherosclerotic changes seen in the tortuous non aneurysmal aorta. No adenopathy. Other: No abdominal wall hernia or abnormality. No abdominopelvic ascites. Musculoskeletal: No acute or significant osseous findings. Review of the MIP images confirms the above findings. IMPRESSION: 1. No thoracic aortic aneurysm or dissection. No pulmonary emboli identified. 2. No acute abnormalities in the chest. 3. Cholelithiasis. Gallbladder wall thickening anteriorly is more prominent prominent in the interval. Acute cholecystitis is not excluded on this study. An ultrasound could further evaluate if clinically warranted. Electronically Signed   By: DDorise BullionIII M.D   On: 02/12/2017 17:41   Ct Abdomen W  Contrast  Result Date: 02/12/2017 CLINICAL DATA:  Patient complains of chest pain beginning last night. EXAM: CT ANGIOGRAPHY CHEST CT ABDOMEN WITH CONTRAST TECHNIQUE: Multidetector CT imaging of the chest was performed using the standard protocol during bolus administration of intravenous contrast. Multiplanar CT image reconstructions and MIPs were obtained to evaluate the vascular anatomy. Multidetector CT imaging of the abdomen and pelvis was performed using the standard protocol during bolus administration of intravenous contrast. CONTRAST:  51m ISOVUE-300 IOPAMIDOL (ISOVUE-300) INJECTION 61% COMPARISON:  Compared to CTA of the chest, abdomen, and pelvis August 30, 2016 FINDINGS: CTA CHEST FINDINGS Cardiovascular: Cardiomegaly identified. Coronary artery calcifications are noted. Atherosclerotic changes associated with the thoracic aorta. No aneurysm or dissection. No pulmonary emboli. Mediastinum/Nodes: There is a moderate to large hiatal hernia. Visualized esophagus otherwise normal. No adenopathy. No effusion. Lungs/Pleura: Central airways are normal. No pneumothorax. A nodule in the lateral right lung base on series 9, image 56 is stable. Atelectasis adjacent to the left hemidiaphragm. No suspicious nodules, masses, or infiltrates otherwise identified. Musculoskeletal: See below Review of the MIP images confirms the above findings. CT ABDOMEN FINDINGS Hepatobiliary: There is a stone in the gallbladder. There is anterior wall thickening associated with the gallbladder measuring up to 6 mm, more prominent in the interval. No adjacent fat stranding. No pericholecystic fluid identified. No focal liver lesions. The portal vein is patent. Pancreas: Unremarkable. No pancreatic ductal dilatation or surrounding inflammatory changes. Spleen: Normal in size without focal abnormality. Adrenals/Urinary Tract: A small nodule in the right adrenal gland is likely an adenoma and is unchanged in the interval. It is too  small to characterize however. The kidneys are normal in appearance as are the proximal ureters. Stomach/Bowel: There is a large hiatal hernia. Visualized stomach, small bowel, and colon are otherwise normal. Vascular/Lymphatic: Atherosclerotic changes seen in the tortuous non aneurysmal aorta. No adenopathy. Other: No abdominal wall hernia or abnormality. No abdominopelvic ascites. Musculoskeletal: No acute or significant osseous findings. Review of the MIP images confirms the above findings. IMPRESSION: 1. No thoracic aortic aneurysm or dissection. No pulmonary emboli identified. 2. No acute abnormalities in the chest. 3. Cholelithiasis. Gallbladder wall thickening anteriorly is more prominent prominent in the interval. Acute cholecystitis is not excluded on this study. An ultrasound could further evaluate if clinically warranted. Electronically Signed   By: DDorise BullionIII M.D   On: 02/12/2017 17:41   [place]dom[place] Limited Ruq  Result Date: 02/12/2017 CLINICAL DATA:  Hyperbilirubinemia.  Nausea and vomiting. EXAM: [place] LIMITED - RIGHT UPPER QUADRANT COMPARISON:  None. FINDINGS: Gallbladder: No gallstones. No sludge. Wall appears mildly thickened where it abuts the liver, measuring 5 mm. No pericholecystic fluid. Common bile duct: Diameter: 4.7 mm, not well visualized. Liver: Liver poorly visualized. Increased parenchymal echogenicity. No liver mass or focal lesion. IMPRESSION: 1. Exam limited by the patient's body habitus. 2. No convincing acute abnormality. No gallstones. No bile duct dilation. 3. Increased echogenicity of the liver consistent with fatty infiltration. Electronically Signed   By: DLajean ManesM.D.   On: 02/12/2017 11:35    Medications: I have reviewed the patient's current medications.   LOS: 5 days   SBarney Drain6/07/2014, 2:23 PM

## 2017-02-14 ENCOUNTER — Telehealth: Payer: Self-pay | Admitting: Gastroenterology

## 2017-02-14 ENCOUNTER — Encounter (HOSPITAL_COMMUNITY)
Admission: EM | Disposition: A | Discharge status: Home / Self Care | Payer: Self-pay | Source: Home / Self Care | Attending: Pulmonary Disease

## 2017-02-14 ENCOUNTER — Inpatient Hospital Stay (HOSPITAL_COMMUNITY): Payer: Medicare Other

## 2017-02-14 DIAGNOSIS — R079 Chest pain, unspecified: Secondary | ICD-10-CM

## 2017-02-14 DIAGNOSIS — E782 Mixed hyperlipidemia: Secondary | ICD-10-CM

## 2017-02-14 DIAGNOSIS — K8050 Calculus of bile duct without cholangitis or cholecystitis without obstruction: Secondary | ICD-10-CM

## 2017-02-14 DIAGNOSIS — I214 Non-ST elevation (NSTEMI) myocardial infarction: Secondary | ICD-10-CM

## 2017-02-14 DIAGNOSIS — I2510 Atherosclerotic heart disease of native coronary artery without angina pectoris: Secondary | ICD-10-CM

## 2017-02-14 DIAGNOSIS — I350 Nonrheumatic aortic (valve) stenosis: Secondary | ICD-10-CM

## 2017-02-14 HISTORY — PX: CORONARY ANGIOGRAPHY: CATH118303

## 2017-02-14 LAB — CBC WITH DIFFERENTIAL/PLATELET
BASOS PCT: 0 %
Basophils Absolute: 0 10*3/uL (ref 0.0–0.1)
EOS ABS: 0.2 10*3/uL (ref 0.0–0.7)
EOS PCT: 3 %
HCT: 38.8 % (ref 36.0–46.0)
HEMOGLOBIN: 12.5 g/dL (ref 12.0–15.0)
LYMPHS ABS: 1.3 10*3/uL (ref 0.7–4.0)
Lymphocytes Relative: 14 %
MCH: 28 pg (ref 26.0–34.0)
MCHC: 32.2 g/dL (ref 30.0–36.0)
MCV: 87 fL (ref 78.0–100.0)
MONOS PCT: 8 %
Monocytes Absolute: 0.8 10*3/uL (ref 0.1–1.0)
NEUTROS PCT: 75 %
Neutro Abs: 7 10*3/uL (ref 1.7–7.7)
Platelets: 234 10*3/uL (ref 150–400)
RBC: 4.46 MIL/uL (ref 3.87–5.11)
RDW: 15.1 % (ref 11.5–15.5)
WBC: 9.4 10*3/uL (ref 4.0–10.5)

## 2017-02-14 LAB — COMPREHENSIVE METABOLIC PANEL
ALT: 188 U/L — ABNORMAL HIGH (ref 14–54)
AST: 118 U/L — ABNORMAL HIGH (ref 15–41)
Albumin: 2.9 g/dL — ABNORMAL LOW (ref 3.5–5.0)
Alkaline Phosphatase: 215 U/L — ABNORMAL HIGH (ref 38–126)
Anion gap: 5 (ref 5–15)
BUN: 9 mg/dL (ref 6–20)
CALCIUM: 8.3 mg/dL — AB (ref 8.9–10.3)
CO2: 29 mmol/L (ref 22–32)
CREATININE: 0.65 mg/dL (ref 0.44–1.00)
Chloride: 105 mmol/L (ref 101–111)
Glucose, Bld: 90 mg/dL (ref 65–99)
Potassium: 4.1 mmol/L (ref 3.5–5.1)
SODIUM: 139 mmol/L (ref 135–145)
Total Bilirubin: 2.1 mg/dL — ABNORMAL HIGH (ref 0.3–1.2)
Total Protein: 5.9 g/dL — ABNORMAL LOW (ref 6.5–8.1)

## 2017-02-14 SURGERY — CORONARY ANGIOGRAPHY (CATH LAB)
Anesthesia: LOCAL

## 2017-02-14 MED ORDER — SODIUM CHLORIDE 0.9% FLUSH
3.0000 mL | Freq: Two times a day (BID) | INTRAVENOUS | Status: DC
  Administered 2017-02-14 – 2017-02-16 (×4): 3 mL via INTRAVENOUS

## 2017-02-14 MED ORDER — IOPAMIDOL (ISOVUE-370) INJECTION 76%
INTRAVENOUS | Status: AC
  Filled 2017-02-14: qty 100

## 2017-02-14 MED ORDER — HEPARIN (PORCINE) IN NACL 2-0.9 UNIT/ML-% IJ SOLN
INTRAMUSCULAR | Status: AC | PRN
  Administered 2017-02-14: 1000 mL

## 2017-02-14 MED ORDER — SODIUM CHLORIDE 0.9 % IV SOLN: 250.0000 mL | INTRAVENOUS | Status: DC | PRN

## 2017-02-14 MED ORDER — ASPIRIN 81 MG PO CHEW
81.0000 mg | CHEWABLE_TABLET | Freq: Every day | ORAL | Status: DC
  Administered 2017-02-15 – 2017-02-16 (×2): 81 mg via ORAL
  Filled 2017-02-14 (×2): qty 1

## 2017-02-14 MED ORDER — FENTANYL CITRATE (PF) 100 MCG/2ML IJ SOLN
INTRAMUSCULAR | Status: DC | PRN
  Administered 2017-02-14: 25 ug via INTRAVENOUS

## 2017-02-14 MED ORDER — SODIUM CHLORIDE 0.9 % IV SOLN: INTRAVENOUS | Status: DC

## 2017-02-14 MED ORDER — LIDOCAINE HCL (PF) 1 % IJ SOLN
INTRAMUSCULAR | Status: DC | PRN
  Administered 2017-02-14: 2 mL

## 2017-02-14 MED ORDER — MIDAZOLAM HCL 2 MG/2ML IJ SOLN
INTRAMUSCULAR | Status: AC
  Filled 2017-02-14: qty 2

## 2017-02-14 MED ORDER — SODIUM CHLORIDE 0.9% FLUSH: 3.0000 mL | INTRAVENOUS | Status: DC | PRN

## 2017-02-14 MED ORDER — HEPARIN SODIUM (PORCINE) 1000 UNIT/ML IJ SOLN
INTRAMUSCULAR | Status: DC | PRN
  Administered 2017-02-14: 5000 [IU] via INTRAVENOUS

## 2017-02-14 MED ORDER — ALBUTEROL SULFATE (2.5 MG/3ML) 0.083% IN NEBU: 2.5000 mg | INHALATION_SOLUTION | RESPIRATORY_TRACT | Status: DC | PRN

## 2017-02-14 MED ORDER — HEPARIN SODIUM (PORCINE) 1000 UNIT/ML IJ SOLN
INTRAMUSCULAR | Status: AC
  Filled 2017-02-14: qty 1

## 2017-02-14 MED ORDER — GADOBENATE DIMEGLUMINE 529 MG/ML IV SOLN
20.0000 mL | Freq: Once | INTRAVENOUS | Status: AC | PRN
  Administered 2017-02-14: 20 mL via INTRAVENOUS

## 2017-02-14 MED ORDER — IOPAMIDOL (ISOVUE-370) INJECTION 76%
INTRAVENOUS | Status: DC | PRN
  Administered 2017-02-14: 50 mL via INTRA_ARTERIAL

## 2017-02-14 MED ORDER — VERAPAMIL HCL 2.5 MG/ML IV SOLN
INTRAVENOUS | Status: DC | PRN
  Administered 2017-02-14: 10 mL via INTRA_ARTERIAL

## 2017-02-14 MED ORDER — FENTANYL CITRATE (PF) 100 MCG/2ML IJ SOLN
INTRAMUSCULAR | Status: AC
  Filled 2017-02-14: qty 2

## 2017-02-14 MED ORDER — LIDOCAINE HCL 1 % IJ SOLN
INTRAMUSCULAR | Status: AC
  Filled 2017-02-14: qty 20

## 2017-02-14 MED ORDER — HEPARIN (PORCINE) IN NACL 2-0.9 UNIT/ML-% IJ SOLN
INTRAMUSCULAR | Status: AC
  Filled 2017-02-14: qty 1000

## 2017-02-14 MED ORDER — SODIUM CHLORIDE 0.9 % IV SOLN: INTRAVENOUS | Status: AC

## 2017-02-14 MED ORDER — ENOXAPARIN SODIUM 40 MG/0.4ML ~~LOC~~ SOLN
40.0000 mg | SUBCUTANEOUS | Status: DC
  Administered 2017-02-15 – 2017-02-16 (×2): 40 mg via SUBCUTANEOUS
  Filled 2017-02-14 (×2): qty 0.4

## 2017-02-14 MED ORDER — VERAPAMIL HCL 2.5 MG/ML IV SOLN
INTRAVENOUS | Status: AC
  Filled 2017-02-14: qty 4

## 2017-02-14 MED ORDER — MIDAZOLAM HCL 2 MG/2ML IJ SOLN
INTRAMUSCULAR | Status: DC | PRN
Start: 2017-02-14 — End: 2017-02-14
  Administered 2017-02-14: 1 mg via INTRAVENOUS

## 2017-02-14 SURGICAL SUPPLY — 10 items
CATH IMPULSE 5F ANG/FL3.5 (CATHETERS) ×1 IMPLANT
DEVICE RAD COMP TR BAND LRG (VASCULAR PRODUCTS) ×1 IMPLANT
GLIDESHEATH SLEND SS 6F .021 (SHEATH) ×1 IMPLANT
GUIDEWIRE INQWIRE 1.5J.035X260 (WIRE) IMPLANT
INQWIRE 1.5J .035X260CM (WIRE) ×2
KIT HEART LEFT (KITS) ×2 IMPLANT
PACK CARDIAC CATHETERIZATION (CUSTOM PROCEDURE TRAY) ×2 IMPLANT
TRANSDUCER W/STOPCOCK (MISCELLANEOUS) ×2 IMPLANT
TUBING CIL FLEX 10 FLL-RA (TUBING) ×2 IMPLANT
WIRE HI TORQ VERSACORE-J 145CM (WIRE) ×1 IMPLANT

## 2017-02-14 NOTE — Telephone Encounter (Addendum)
PT ADMITTED MAY 2018 Elevated liver enzymes, ETIOLOGY UNCLEAR-ADVERSE MEDICATION REACTION(DETROL), PASSED A  GALLSTONE,  LESS LIKELY R HEART FAILURE. PT TRANSFERRED FOR HEART CATH.  REPEAT HFP IN 2 WEEKS. OPV IN 4-6 WEEKS E30 ELEVATE LIVER ENZYMES.

## 2017-02-14 NOTE — Progress Notes (Signed)
REVIEWED. Elevated liver enzymes, ETIOLOGY UNCLEAR-PASSED A GALLSTONE, ADVERSE MEDICATION REACTION(DETROL), LESS LIKELY R HEART FAILURE. PT TRANSFERRED FOR HEART CATH. OPV IN 4-6 WEEKS. REPEAT HFP IN 2 WEEKS.  Subjective: No abdominal pain, no N/V. Feels great. Wants to eat her breakfast. No chest pain or shortness of breath.    Objective: Vital signs in last 24 hours: Temp:  [99.1 F (37.3 C)-99.5 F (37.5 C)] 99.3 F (37.4 C) (05/21 0457) Pulse Rate:  [76-97] 79 (05/21 0457) Resp:  [16-20] 18 (05/21 0457) BP: (128-172)/(63-90) 128/90 (05/21 0457) SpO2:  [95 %-98 %] 95 % (05/21 0457) Last BM Date: 02/10/17 General:   Alert and oriented, pleasant Head:  Normocephalic and atraumatic. Abdomen:  Bowel sounds present, soft, non-tender, non-distended. Obese. No rebound or guarding.  Neurologic:  Alert and  oriented x4 Psych:  Alert and cooperative. Normal mood and affect.  Intake/Output from previous day: 05/20 0701 - 05/21 0700 In: 820 [P.O.:720; IV Piggyback:100] Out: 950 [Urine:950] Intake/Output this shift: No intake/output data recorded.  Lab Results:  Recent Labs  02/13/17 0121 02/13/17 1007 02/14/17 0421  WBC 14.5* 13.7* 9.4  HGB 12.4 13.3 12.5  HCT 37.4 39.9 38.8  PLT 217 221 234   BMET  Recent Labs  02/12/17 0551 02/13/17 1007 02/14/17 0421  NA 139 141 139  K 3.4* 2.9* 4.1  CL 103 105 105  CO2 27 29 29   GLUCOSE 148* 139* 90  BUN 10 10 9   CREATININE 0.56 0.60 0.65  CALCIUM 8.5* 8.4* 8.3*   LFT  Recent Labs  02/12/17 0154 02/12/17 0551 02/12/17 0758 02/13/17 1007 02/14/17 0421  PROT 6.8 6.7  --  6.3* 5.9*  ALBUMIN 3.6 3.5  --  3.1* 2.9*  AST 414* 421*  --  191* 118*  ALT 315* 333*  --  240* 188*  ALKPHOS 303* 304*  --  242* 215*  BILITOT 2.9* 3.2*  --  4.8* 2.1*  BILIDIR 1.8*  --  2.5*  --   --   IBILI 1.1*  --   --   --   --    PT/INR  Recent Labs  02/12/17 0758  LABPROT 12.7  INR 0.95   Studies/Results: Ct Angio Chest Pe W Or Wo  Contrast  Result Date: 02/12/2017 CLINICAL DATA:  Patient complains of chest pain beginning last night. EXAM: CT ANGIOGRAPHY CHEST CT ABDOMEN WITH CONTRAST TECHNIQUE: Multidetector CT imaging of the chest was performed using the standard protocol during bolus administration of intravenous contrast. Multiplanar CT image reconstructions and MIPs were obtained to evaluate the vascular anatomy. Multidetector CT imaging of the abdomen and pelvis was performed using the standard protocol during bolus administration of intravenous contrast. CONTRAST:  30mL ISOVUE-300 IOPAMIDOL (ISOVUE-300) INJECTION 61% COMPARISON:  Compared to CTA of the chest, abdomen, and pelvis August 30, 2016 FINDINGS: CTA CHEST FINDINGS Cardiovascular: Cardiomegaly identified. Coronary artery calcifications are noted. Atherosclerotic changes associated with the thoracic aorta. No aneurysm or dissection. No pulmonary emboli. Mediastinum/Nodes: There is a moderate to large hiatal hernia. Visualized esophagus otherwise normal. No adenopathy. No effusion. Lungs/Pleura: Central airways are normal. No pneumothorax. A nodule in the lateral right lung base on series 9, image 56 is stable. Atelectasis adjacent to the left hemidiaphragm. No suspicious nodules, masses, or infiltrates otherwise identified. Musculoskeletal: See below Review of the MIP images confirms the above findings. CT ABDOMEN FINDINGS Hepatobiliary: There is a stone in the gallbladder. There is anterior wall thickening associated with the gallbladder measuring up to 6 mm, more  prominent in the interval. No adjacent fat stranding. No pericholecystic fluid identified. No focal liver lesions. The portal vein is patent. Pancreas: Unremarkable. No pancreatic ductal dilatation or surrounding inflammatory changes. Spleen: Normal in size without focal abnormality. Adrenals/Urinary Tract: A small nodule in the right adrenal gland is likely an adenoma and is unchanged in the interval. It is too  small to characterize however. The kidneys are normal in appearance as are the proximal ureters. Stomach/Bowel: There is a large hiatal hernia. Visualized stomach, small bowel, and colon are otherwise normal. Vascular/Lymphatic: Atherosclerotic changes seen in the tortuous non aneurysmal aorta. No adenopathy. Other: No abdominal wall hernia or abnormality. No abdominopelvic ascites. Musculoskeletal: No acute or significant osseous findings. Review of the MIP images confirms the above findings. IMPRESSION: 1. No thoracic aortic aneurysm or dissection. No pulmonary emboli identified. 2. No acute abnormalities in the chest. 3. Cholelithiasis. Gallbladder wall thickening anteriorly is more prominent prominent in the interval. Acute cholecystitis is not excluded on this study. An ultrasound could further evaluate if clinically warranted. Electronically Signed   By: Gerome Sam III M.D   On: 02/12/2017 17:41   Ct Abdomen W Contrast  Result Date: 02/12/2017 CLINICAL DATA:  Patient complains of chest pain beginning last night. EXAM: CT ANGIOGRAPHY CHEST CT ABDOMEN WITH CONTRAST TECHNIQUE: Multidetector CT imaging of the chest was performed using the standard protocol during bolus administration of intravenous contrast. Multiplanar CT image reconstructions and MIPs were obtained to evaluate the vascular anatomy. Multidetector CT imaging of the abdomen and pelvis was performed using the standard protocol during bolus administration of intravenous contrast. CONTRAST:  30mL ISOVUE-300 IOPAMIDOL (ISOVUE-300) INJECTION 61% COMPARISON:  Compared to CTA of the chest, abdomen, and pelvis August 30, 2016 FINDINGS: CTA CHEST FINDINGS Cardiovascular: Cardiomegaly identified. Coronary artery calcifications are noted. Atherosclerotic changes associated with the thoracic aorta. No aneurysm or dissection. No pulmonary emboli. Mediastinum/Nodes: There is a moderate to large hiatal hernia. Visualized esophagus otherwise normal. No  adenopathy. No effusion. Lungs/Pleura: Central airways are normal. No pneumothorax. A nodule in the lateral right lung base on series 9, image 56 is stable. Atelectasis adjacent to the left hemidiaphragm. No suspicious nodules, masses, or infiltrates otherwise identified. Musculoskeletal: See below Review of the MIP images confirms the above findings. CT ABDOMEN FINDINGS Hepatobiliary: There is a stone in the gallbladder. There is anterior wall thickening associated with the gallbladder measuring up to 6 mm, more prominent in the interval. No adjacent fat stranding. No pericholecystic fluid identified. No focal liver lesions. The portal vein is patent. Pancreas: Unremarkable. No pancreatic ductal dilatation or surrounding inflammatory changes. Spleen: Normal in size without focal abnormality. Adrenals/Urinary Tract: A small nodule in the right adrenal gland is likely an adenoma and is unchanged in the interval. It is too small to characterize however. The kidneys are normal in appearance as are the proximal ureters. Stomach/Bowel: There is a large hiatal hernia. Visualized stomach, small bowel, and colon are otherwise normal. Vascular/Lymphatic: Atherosclerotic changes seen in the tortuous non aneurysmal aorta. No adenopathy. Other: No abdominal wall hernia or abnormality. No abdominopelvic ascites. Musculoskeletal: No acute or significant osseous findings. Review of the MIP images confirms the above findings. IMPRESSION: 1. No thoracic aortic aneurysm or dissection. No pulmonary emboli identified. 2. No acute abnormalities in the chest. 3. Cholelithiasis. Gallbladder wall thickening anteriorly is more prominent prominent in the interval. Acute cholecystitis is not excluded on this study. An ultrasound could further evaluate if clinically warranted. Electronically Signed   By: Onalee Hua  Williams III M.D   On: 05/19/Judithe Modest2018 17:41   Mr 3d Recon At Scanner  Result Date: 02/14/2017 CLINICAL DATA:  83 year old female with  history of cholelithiasis. Evaluate for acute cholecystitis. EXAM: MRI ABDOMEN WITHOUT AND WITH CONTRAST (INCLUDING MRCP) TECHNIQUE: Multiplanar multisequence MR imaging of the abdomen was performed both before and after the administration of intravenous contrast. Heavily T2-weighted images of the biliary and pancreatic ducts were obtained, and three-dimensional MRCP images were rendered by post processing. CONTRAST:  20mL MULTIHANCE GADOBENATE DIMEGLUMINE 529 MG/ML IV SOLN COMPARISON:  None. FINDINGS: Comment: Study is mildly limited by considerable patient respiratory motion. Lower chest: Unremarkable. Hepatobiliary: No discrete cystic or solid hepatic lesions. No intra or extrahepatic biliary ductal dilatation. Small filling defects in the gallbladder, compatible with a gallstone. MRCP images are considerably limited by patient respiratory motion. With these limitations in mind, there are no additional filling defects noted in the common bile duct to suggest choledocholithiasis. Common bile duct is normal in caliber measuring 6 mm in the porta hepatis. No intrahepatic biliary ductal dilatation. Gallbladder wall appears mildly thickened measuring up to 5 mm. However, the gallbladder is under distended. No pericholecystic fluid. Pancreas: No pancreatic mass. No pancreatic ductal dilatation appreciated on today's limited MRCP images. No pancreatic or peripancreatic fluid or inflammatory changes. Spleen:  Unremarkable. Adrenals/Urinary Tract: Bilateral kidneys and bilateral adrenal glands are normal in appearance. There is no hydroureteronephrosis in the visualized portions of the abdomen. Stomach/Bowel: Visualized portions are unremarkable. Vascular/Lymphatic: Aortic atherosclerosis, without evidence of aneurysm in the visualized abdominal vasculature. No lymphadenopathy noted in the abdomen. Other: No significant volume of ascites noted in the visualized peritoneal cavity. Musculoskeletal: No aggressive osseous  lesions are noted in the visualized portions of the skeleton. S-shaped thoracolumbar scoliosis convex to the right superiorly and to the left inferiorly. IMPRESSION: 1. Cholelithiasis. Although there is some mild gallbladder wall thickening, the gallbladder is under distended, and this is likely a contributing factor. There is no pericholecystic fluid or other surrounding inflammatory changes to suggest an acute cholecystitis at this time. 2. No choledocholithiasis or evidence of biliary tract obstruction. Electronically Signed   By: Trudie Reedaniel  Entrikin M.D.   On: 02/14/2017 08:50   Mr Abdomen Mrcp Vivien RossettiW Wo Contast  Result Date: 02/14/2017 CLINICAL DATA:  83 year old female with history of cholelithiasis. Evaluate for acute cholecystitis. EXAM: MRI ABDOMEN WITHOUT AND WITH CONTRAST (INCLUDING MRCP) TECHNIQUE: Multiplanar multisequence MR imaging of the abdomen was performed both before and after the administration of intravenous contrast. Heavily T2-weighted images of the biliary and pancreatic ducts were obtained, and three-dimensional MRCP images were rendered by post processing. CONTRAST:  20mL MULTIHANCE GADOBENATE DIMEGLUMINE 529 MG/ML IV SOLN COMPARISON:  None. FINDINGS: Comment: Study is mildly limited by considerable patient respiratory motion. Lower chest: Unremarkable. Hepatobiliary: No discrete cystic or solid hepatic lesions. No intra or extrahepatic biliary ductal dilatation. Small filling defects in the gallbladder, compatible with a gallstone. MRCP images are considerably limited by patient respiratory motion. With these limitations in mind, there are no additional filling defects noted in the common bile duct to suggest choledocholithiasis. Common bile duct is normal in caliber measuring 6 mm in the porta hepatis. No intrahepatic biliary ductal dilatation. Gallbladder wall appears mildly thickened measuring up to 5 mm. However, the gallbladder is under distended. No pericholecystic fluid. Pancreas: No  pancreatic mass. No pancreatic ductal dilatation appreciated on today's limited MRCP images. No pancreatic or peripancreatic fluid or inflammatory changes. Spleen:  Unremarkable. Adrenals/Urinary Tract: Bilateral kidneys and bilateral adrenal  glands are normal in appearance. There is no hydroureteronephrosis in the visualized portions of the abdomen. Stomach/Bowel: Visualized portions are unremarkable. Vascular/Lymphatic: Aortic atherosclerosis, without evidence of aneurysm in the visualized abdominal vasculature. No lymphadenopathy noted in the abdomen. Other: No significant volume of ascites noted in the visualized peritoneal cavity. Musculoskeletal: No aggressive osseous lesions are noted in the visualized portions of the skeleton. S-shaped thoracolumbar scoliosis convex to the right superiorly and to the left inferiorly. IMPRESSION: 1. Cholelithiasis. Although there is some mild gallbladder wall thickening, the gallbladder is under distended, and this is likely a contributing factor. There is no pericholecystic fluid or other surrounding inflammatory changes to suggest an acute cholecystitis at this time. 2. No choledocholithiasis or evidence of biliary tract obstruction. Electronically Signed   By: Trudie Reed M.D.   On: 02/14/2017 08:50   US Abdomen Limited Ruq  Result Date: 02/12/2017 CLINICAL DATA:  Hyperbilirubinemia.  Nausea and vomiting. EXAM: US ABDOMEN LIMITED - RIGHT UPPER QUADRANT COMPARISON:  None. FINDINGS: Gallbladder: No gallstones. No sludge. Wall appears mildly thickened where it abuts the liver, measuring 5 mm. No pericholecystic fluid. Common bile duct: Diameter: 4.7 mm, not well visualized. Liver: Liver poorly visualized. Increased parenchymal echogenicity. No liver mass or focal lesion. IMPRESSION: 1. Exam limited by the patient's body habitus. 2. No convincing acute abnormality. No gallstones. No bile duct dilation. 3. Increased echogenicity of the liver consistent with fatty  infiltration. Electronically Signed   By: Amie Portland M.D.   On: 02/12/2017 11:35    Assessment: 83 year old female admitted with chest pain, elevated LFTs, CTA chest without PE and noting cholelithiasis, CT abdomen also performed without CBD stone. MRCP just completed and without evidence of choledocholithiasis. LFTs improved this morning, with bilirubin now 2. Clinically, patient states she feels well and has no complaints; however, troponins increasing.   Plan: Follow LFTs: will repeat in am  Supportive care Cardiology on board and consult pending     Gelene Mink, PhD, ANP-BC [facility]    LOS: 1 day    02/14/2017, 9:23 AM

## 2017-02-14 NOTE — Progress Notes (Signed)
Subjective: Patient has no abdominal pain. Is having episodes of chest pain. Patient is hungry.  Objective: Vital signs in last 24 hours: Temp:  [99.1 F (37.3 C)-99.5 F (37.5 C)] 99.3 F (37.4 C) (05/21 0457) Pulse Rate:  [76-97] 79 (05/21 0457) Resp:  [16-20] 18 (05/21 0457) BP: (128-172)/(63-90) 128/90 (05/21 0457) SpO2:  [95 %-98 %] 95 % (05/21 0457) Last BM Date: 02/10/17  Intake/Output from previous day: 05/20 0701 - 05/21 0700 In: 820 [P.O.:720; IV Piggyback:100] Out: 950 [Urine:950] Intake/Output this shift: No intake/output data recorded.  General appearance: alert, cooperative and no distress GI: soft, non-tender; bowel sounds normal; no masses,  no organomegaly  Lab Results:   Recent Labs  02/13/17 1007 02/14/17 0421  WBC 13.7* 9.4  HGB 13.3 12.5  HCT 39.9 38.8  PLT 221 234   BMET  Recent Labs  02/13/17 1007 02/14/17 0421  NA 141 139  K 2.9* 4.1  CL 105 105  CO2 29 29  GLUCOSE 139* 90  BUN 10 9  CREATININE 0.60 0.65  CALCIUM 8.4* 8.3*   PT/INR  Recent Labs  02/12/17 0758  LABPROT 12.7  INR 0.95    Studies/Results: Ct Angio Chest Pe W Or Wo Contrast  Result Date: 02/12/2017 CLINICAL DATA:  Patient complains of chest pain beginning last night. EXAM: CT ANGIOGRAPHY CHEST CT ABDOMEN WITH CONTRAST TECHNIQUE: Multidetector CT imaging of the chest was performed using the standard protocol during bolus administration of intravenous contrast. Multiplanar CT image reconstructions and MIPs were obtained to evaluate the vascular anatomy. Multidetector CT imaging of the abdomen and pelvis was performed using the standard protocol during bolus administration of intravenous contrast. CONTRAST:  30mL ISOVUE-300 IOPAMIDOL (ISOVUE-300) INJECTION 61% COMPARISON:  Compared to CTA of the chest, abdomen, and pelvis August 30, 2016 FINDINGS: CTA CHEST FINDINGS Cardiovascular: Cardiomegaly identified. Coronary artery calcifications are noted. Atherosclerotic  changes associated with the thoracic aorta. No aneurysm or dissection. No pulmonary emboli. Mediastinum/Nodes: There is a moderate to large hiatal hernia. Visualized esophagus otherwise normal. No adenopathy. No effusion. Lungs/Pleura: Central airways are normal. No pneumothorax. A nodule in the lateral right lung base on series 9, image 56 is stable. Atelectasis adjacent to the left hemidiaphragm. No suspicious nodules, masses, or infiltrates otherwise identified. Musculoskeletal: See below Review of the MIP images confirms the above findings. CT ABDOMEN FINDINGS Hepatobiliary: There is a stone in the gallbladder. There is anterior wall thickening associated with the gallbladder measuring up to 6 mm, more prominent in the interval. No adjacent fat stranding. No pericholecystic fluid identified. No focal liver lesions. The portal vein is patent. Pancreas: Unremarkable. No pancreatic ductal dilatation or surrounding inflammatory changes. Spleen: Normal in size without focal abnormality. Adrenals/Urinary Tract: A small nodule in the right adrenal gland is likely an adenoma and is unchanged in the interval. It is too small to characterize however. The kidneys are normal in appearance as are the proximal ureters. Stomach/Bowel: There is a large hiatal hernia. Visualized stomach, small bowel, and colon are otherwise normal. Vascular/Lymphatic: Atherosclerotic changes seen in the tortuous non aneurysmal aorta. No adenopathy. Other: No abdominal wall hernia or abnormality. No abdominopelvic ascites. Musculoskeletal: No acute or significant osseous findings. Review of the MIP images confirms the above findings. IMPRESSION: 1. No thoracic aortic aneurysm or dissection. No pulmonary emboli identified. 2. No acute abnormalities in the chest. 3. Cholelithiasis. Gallbladder wall thickening anteriorly is more prominent prominent in the interval. Acute cholecystitis is not excluded on this study. An ultrasound could further  evaluate if clinically warranted. Electronically Signed   By: Gerome Samavid  Williams III M.D   On: 02/12/2017 17:41   Ct Abdomen W Contrast  Result Date: 02/12/2017 CLINICAL DATA:  Patient complains of chest pain beginning last night. EXAM: CT ANGIOGRAPHY CHEST CT ABDOMEN WITH CONTRAST TECHNIQUE: Multidetector CT imaging of the chest was performed using the standard protocol during bolus administration of intravenous contrast. Multiplanar CT image reconstructions and MIPs were obtained to evaluate the vascular anatomy. Multidetector CT imaging of the abdomen and pelvis was performed using the standard protocol during bolus administration of intravenous contrast. CONTRAST:  30mL ISOVUE-300 IOPAMIDOL (ISOVUE-300) INJECTION 61% COMPARISON:  Compared to CTA of the chest, abdomen, and pelvis August 30, 2016 FINDINGS: CTA CHEST FINDINGS Cardiovascular: Cardiomegaly identified. Coronary artery calcifications are noted. Atherosclerotic changes associated with the thoracic aorta. No aneurysm or dissection. No pulmonary emboli. Mediastinum/Nodes: There is a moderate to large hiatal hernia. Visualized esophagus otherwise normal. No adenopathy. No effusion. Lungs/Pleura: Central airways are normal. No pneumothorax. A nodule in the lateral right lung base on series 9, image 56 is stable. Atelectasis adjacent to the left hemidiaphragm. No suspicious nodules, masses, or infiltrates otherwise identified. Musculoskeletal: See below Review of the MIP images confirms the above findings. CT ABDOMEN FINDINGS Hepatobiliary: There is a stone in the gallbladder. There is anterior wall thickening associated with the gallbladder measuring up to 6 mm, more prominent in the interval. No adjacent fat stranding. No pericholecystic fluid identified. No focal liver lesions. The portal vein is patent. Pancreas: Unremarkable. No pancreatic ductal dilatation or surrounding inflammatory changes. Spleen: Normal in size without focal abnormality.  Adrenals/Urinary Tract: A small nodule in the right adrenal gland is likely an adenoma and is unchanged in the interval. It is too small to characterize however. The kidneys are normal in appearance as are the proximal ureters. Stomach/Bowel: There is a large hiatal hernia. Visualized stomach, small bowel, and colon are otherwise normal. Vascular/Lymphatic: Atherosclerotic changes seen in the tortuous non aneurysmal aorta. No adenopathy. Other: No abdominal wall hernia or abnormality. No abdominopelvic ascites. Musculoskeletal: No acute or significant osseous findings. Review of the MIP images confirms the above findings. IMPRESSION: 1. No thoracic aortic aneurysm or dissection. No pulmonary emboli identified. 2. No acute abnormalities in the chest. 3. Cholelithiasis. Gallbladder wall thickening anteriorly is more prominent prominent in the interval. Acute cholecystitis is not excluded on this study. An ultrasound could further evaluate if clinically warranted. Electronically Signed   By: Gerome Samavid  Williams III M.D   On: 02/12/2017 17:41   Mr 3d Recon At Scanner  Result Date: 02/14/2017 CLINICAL DATA:  83 year old female with history of cholelithiasis. Evaluate for acute cholecystitis. EXAM: MRI ABDOMEN WITHOUT AND WITH CONTRAST (INCLUDING MRCP) TECHNIQUE: Multiplanar multisequence MR imaging of the abdomen was performed both before and after the administration of intravenous contrast. Heavily T2-weighted images of the biliary and pancreatic ducts were obtained, and three-dimensional MRCP images were rendered by post processing. CONTRAST:  20mL MULTIHANCE GADOBENATE DIMEGLUMINE 529 MG/ML IV SOLN COMPARISON:  None. FINDINGS: Comment: Study is mildly limited by considerable patient respiratory motion. Lower chest: Unremarkable. Hepatobiliary: No discrete cystic or solid hepatic lesions. No intra or extrahepatic biliary ductal dilatation. Small filling defects in the gallbladder, compatible with a gallstone. MRCP  images are considerably limited by patient respiratory motion. With these limitations in mind, there are no additional filling defects noted in the common bile duct to suggest choledocholithiasis. Common bile duct is normal in caliber measuring 6 mm in the porta  hepatis. No intrahepatic biliary ductal dilatation. Gallbladder wall appears mildly thickened measuring up to 5 mm. However, the gallbladder is under distended. No pericholecystic fluid. Pancreas: No pancreatic mass. No pancreatic ductal dilatation appreciated on today's limited MRCP images. No pancreatic or peripancreatic fluid or inflammatory changes. Spleen:  Unremarkable. Adrenals/Urinary Tract: Bilateral kidneys and bilateral adrenal glands are normal in appearance. There is no hydroureteronephrosis in the visualized portions of the abdomen. Stomach/Bowel: Visualized portions are unremarkable. Vascular/Lymphatic: Aortic atherosclerosis, without evidence of aneurysm in the visualized abdominal vasculature. No lymphadenopathy noted in the abdomen. Other: No significant volume of ascites noted in the visualized peritoneal cavity. Musculoskeletal: No aggressive osseous lesions are noted in the visualized portions of the skeleton. S-shaped thoracolumbar scoliosis convex to the right superiorly and to the left inferiorly. IMPRESSION: 1. Cholelithiasis. Although there is some mild gallbladder wall thickening, the gallbladder is under distended, and this is likely a contributing factor. There is no pericholecystic fluid or other surrounding inflammatory changes to suggest an acute cholecystitis at this time. 2. No choledocholithiasis or evidence of biliary tract obstruction. Electronically Signed   By: Trudie Reed M.D.   On: 02/14/2017 08:50   Mr Abdomen Mrcp Vivien Rossetti Contast  Result Date: 02/14/2017 CLINICAL DATA:  83 year old female with history of cholelithiasis. Evaluate for acute cholecystitis. EXAM: MRI ABDOMEN WITHOUT AND WITH CONTRAST (INCLUDING  MRCP) TECHNIQUE: Multiplanar multisequence MR imaging of the abdomen was performed both before and after the administration of intravenous contrast. Heavily T2-weighted images of the biliary and pancreatic ducts were obtained, and three-dimensional MRCP images were rendered by post processing. CONTRAST:  20mL MULTIHANCE GADOBENATE DIMEGLUMINE 529 MG/ML IV SOLN COMPARISON:  None. FINDINGS: Comment: Study is mildly limited by considerable patient respiratory motion. Lower chest: Unremarkable. Hepatobiliary: No discrete cystic or solid hepatic lesions. No intra or extrahepatic biliary ductal dilatation. Small filling defects in the gallbladder, compatible with a gallstone. MRCP images are considerably limited by patient respiratory motion. With these limitations in mind, there are no additional filling defects noted in the common bile duct to suggest choledocholithiasis. Common bile duct is normal in caliber measuring 6 mm in the porta hepatis. No intrahepatic biliary ductal dilatation. Gallbladder wall appears mildly thickened measuring up to 5 mm. However, the gallbladder is under distended. No pericholecystic fluid. Pancreas: No pancreatic mass. No pancreatic ductal dilatation appreciated on today's limited MRCP images. No pancreatic or peripancreatic fluid or inflammatory changes. Spleen:  Unremarkable. Adrenals/Urinary Tract: Bilateral kidneys and bilateral adrenal glands are normal in appearance. There is no hydroureteronephrosis in the visualized portions of the abdomen. Stomach/Bowel: Visualized portions are unremarkable. Vascular/Lymphatic: Aortic atherosclerosis, without evidence of aneurysm in the visualized abdominal vasculature. No lymphadenopathy noted in the abdomen. Other: No significant volume of ascites noted in the visualized peritoneal cavity. Musculoskeletal: No aggressive osseous lesions are noted in the visualized portions of the skeleton. S-shaped thoracolumbar scoliosis convex to the right  superiorly and to the left inferiorly. IMPRESSION: 1. Cholelithiasis. Although there is some mild gallbladder wall thickening, the gallbladder is under distended, and this is likely a contributing factor. There is no pericholecystic fluid or other surrounding inflammatory changes to suggest an acute cholecystitis at this time. 2. No choledocholithiasis or evidence of biliary tract obstruction. Electronically Signed   By: Trudie Reed M.D.   On: 02/14/2017 08:50    Anti-infectives: Anti-infectives    Start     Dose/Rate Route Frequency Ordered Stop   02/13/17 1400  piperacillin-tazobactam (ZOSYN) IVPB 3.375 g     3.375  g 12.5 mL/hr over 240 Minutes Intravenous Every 8 hours 02/13/17 1055     02/12/17 0600  piperacillin-tazobactam (ZOSYN) IVPB 3.375 g  Status:  Discontinued     3.375 g 100 mL/hr over 30 Minutes Intravenous Every 8 hours 02/12/17 0404 02/12/17 0422   02/12/17 0430  piperacillin-tazobactam (ZOSYN) IVPB 3.375 g     3.375 g 12.5 mL/hr over 240 Minutes Intravenous  Once 02/12/17 0422 02/12/17 0902   02/12/17 0315  cefTRIAXone (ROCEPHIN) 2 g in dextrose 5 % 50 mL IVPB     2 g 100 mL/hr over 30 Minutes Intravenous  Once 02/12/17 0303 02/12/17 0343      Assessment/Plan: s/p Procedure(s): Left Heart Cath and Coronary Angiography Impression: Biliary colic, cholecystitis, cholelithiasis, resolving. MRCP negative for choledocholithiasis. Contracted gallbladder was found. Leukocytosis has resolved. Liver enzyme tests are normalizing. No need for acute surgical intervention at this time. She may have passed a stone. Patient to have cardiac catheterization today. Patient's diet may be advanced once cleared by cardiology. further management pending cardiac cath results.  LOS: 1 day    Franky Macho 02/14/2017

## 2017-02-14 NOTE — Consult Note (Signed)
Cardiology Consultation:   Patient ID: [name]; [id number]; 10-01-1932   Admit date: 02/12/2017 Date of Consult: 02/14/2017  Primary Care Provider: Kari Baars, MD Primary Cardiologist: Dr [name]    Patient Profile:   [name] is a 83 y.o. female with a hx of chest pain who is being seen today for the evaluation of chest pain at the request of Dr [name].  History of Present Illness:   [name] 83 y/o obese Caucasian widow, lives alone in her own home, a daughter lives nearby. She has no history of MI. She was admitted in Dec 2017 with similar chest pain. Myoview was low risk, echo showed HCVD and moderate AS with normal LVF, CT scan abdominal atherosclerosis but no aneurysm.  It was felt then that her pain was non cardiac. She did have a finding of incidental cholelithiasis without obstruction.   The pt is admitted now with epigastric pressure associated with nausea and vomiting for 3 hours 02/12/17. Her EKG showed no acute changes. She did say that she felt NTG SL helped. Her LFTs are elevated and an abdominal CT showed cholelithiasis with GB wall thickening, new from her last scan. Echo showed moderate to severe AS, LVH, normal LVF (no change). Her Troponin was also elevated as well with a peak of 0.45. She had an abdominal MRI this am.   Past Medical History:  Diagnosis Date  . Anxiety   . Arthritis   . Blood transfusion   . Chronic bronchitis   . Constipation   . Depression   . Hyperlipidemia   . Hypothyroidism     Past Surgical History:  Procedure Laterality Date  . EYE SURGERY  2009   bilateral  . HEMATOMA EVACUATION  06/04/2011   Procedure: EVACUATION HEMATOMA;  Surgeon: Fuller Canada, MD;  Location: [facility];  Service: Orthopedics;  Laterality: Right;  evacuation of seroma  . JOINT REPLACEMENT  05/2010   left total knee Dr. Romeo Apple  . Left hip replacement @ [facility] '98    . Left total knee    . PATELLAR TENDON REPAIR  06/04/2011     Procedure: PATELLA TENDON REPAIR;  Surgeon: Fuller Canada, MD;  Location: [facility];  Service: Orthopedics;  Laterality: Right;  . PATELLAR TENDON REPAIR  08/30/2011   Procedure: PATELLA TENDON REPAIR;  Surgeon: Fuller Canada, MD;  Location: [facility];  Service: Orthopedics;  Laterality: Right;  Allograft Reconstrucation Right Patella Tendon  . REPLACEMENT TOTAL KNEE BILATERAL    . TOTAL HIP ARTHROPLASTY    . TOTAL KNEE ARTHROPLASTY  05/03/2011   Procedure: TOTAL KNEE ARTHROPLASTY;  Surgeon: Fuller Canada, MD;  Location: [facility];  Service: Orthopedics;  Laterality: Right;  With DePuy  . TOTAL THYROIDECTOMY  March 2011   Dr. Suszanne Conners @ [facility]  . TUBAL LIGATION       Inpatient Medications: Scheduled Meds: . albuterol  2.5 mg Nebulization TID  . aspirin  325 mg Oral Daily  . darifenacin  15 mg Oral Daily  . levothyroxine  150 mcg Oral QAC breakfast  . PARoxetine  20 mg Oral QPM   Continuous Infusions: . famotidine (PEPCID) IV Stopped (02/13/17 2105)  . piperacillin-tazobactam Stopped (02/14/17 0857)   PRN Meds: HYDROmorphone, LORazepam, ondansetron (ZOFRAN) IV  Allergies:   No Known Allergies  Social History:   Social History   Social History  . Marital status: Widowed    Spouse name: N/A  . Number of children: N/A  . Years of education: N/A  Occupational History  . Not on file.   Social History Main Topics  . Smoking status: Never Smoker  . Smokeless tobacco: Never Used  . Alcohol use No  . Drug use: No  . Sexual activity: No   Other Topics Concern  . Not on file   Social History Narrative   ** Merged History Encounter **        Family History:   [name] family history includes Blindness in her sister; Colon cancer in her mother. There is no history of Anesthesia problems, Hypotension, Malignant hyperthermia, or Pseudochol deficiency.  ROS:  Please see the history of present illness.  All other ROS reviewed and negative.     Physical Exam/Data:    Vitals:   02/13/17 1523 02/13/17 2016 02/13/17 2150 02/14/17 0457  BP: (!) 160/65  (!) 172/63 128/90  Pulse: 76  97 79  Resp: 16  20 18   Temp: 99.1 F (37.3 C)  99.5 F (37.5 C) 99.3 F (37.4 C)  TempSrc: Oral  Oral Oral  SpO2: 96% 95% 98% 95%  Weight:      Height:        Intake/Output Summary (Last 24 hours) at 02/14/17 0908 Last data filed at 02/13/17 2100  Gross per 24 hour  Intake              580 ml  Output              950 ml  Net             -370 ml   Filed Weights   02/12/17 0141  Weight: 244 lb (110.7 kg)   Body mass index is 34.03 kg/m.  General:  Obese Caucasian female in no acute distress HEENT: normal Lymph: no adenopathy Neck: no JVD, transmitted bruit Endocrine:  No thryomegaly Vascular: FA pulses diminnished bilaterally without bruits  Cardiac:  normal S1, S2; RRR; 2/6 systolic murmur AOV Lungs:  clear to auscultation bilaterally, no wheezing, rhonchi or rales  Abd: soft, nontender, no hepatomegaly, no epigastric tenderness Ext: no edema Musculoskeletal: hallux valgus deformity Rt foot/toes,    BUE and BLE strength normal and equal Skin: warm and dry  Neuro:  CNs 2-12 intact, no focal abnormalities noted Psych:  Normal affect    EKG:  The EKG was personally reviewed and demonstrates NSR, no acute changes  Relevant CV Studies: Echo 02/13/17- Impressions:  - Moderate LVH with LVEF 65-70% and grade 1 diastolic dysfunction.   Mild left atrial enlargement. Moderate mitral annular   calcification with trivial mitral regurgitation. Moderate to   severe calcific aortic stenosis as outlined above. Mild tricuspid   regurgitation with PASP 50 mmHg.  Laboratory Data:  Chemistry Recent Labs Lab 02/12/17 0551 02/13/17 1007 02/14/17 0421  NA 139 141 139  K 3.4* 2.9* 4.1  CL 103 105 105  CO2 27 29 29   GLUCOSE 148* 139* 90  BUN 10 10 9   CREATININE 0.56 0.60 0.65  CALCIUM 8.5* 8.4* 8.3*  GFRNONAA >60 >60 >60  GFRAA >60 >60 >60  ANIONGAP 9 7  5      Recent Labs Lab 02/12/17 0551 02/13/17 1007 02/14/17 0421  PROT 6.7 6.3* 5.9*  ALBUMIN 3.5 3.1* 2.9*  AST 421* 191* 118*  ALT 333* 240* 188*  ALKPHOS 304* 242* 215*  BILITOT 3.2* 4.8* 2.1*   Hematology Recent Labs Lab 02/13/17 0121 02/13/17 1007 02/14/17 0421  WBC 14.5* 13.7* 9.4  RBC 4.35 4.62 4.46  HGB 12.4 13.3 12.5  HCT 37.4 39.9 38.8  MCV 86.0 86.4 87.0  MCH 28.5 28.8 28.0  MCHC 33.2 33.3 32.2  RDW 14.6 14.8 15.1  PLT 217 221 234   Cardiac Enzymes Recent Labs Lab 02/12/17 1342 02/12/17 1908 02/13/17 0121 02/13/17 1007 02/13/17 1529 02/13/17 2156  TROPONINI 0.27* 0.33* 0.26* 0.36* 0.43* 0.45*    Recent Labs Lab 02/12/17 0216  TROPIPOC 0.01    BNP Recent Labs Lab 02/12/17 0551  BNP 137.0*    DDimer  Recent Labs Lab 02/12/17 1342  DDIMER 4.14*    Radiology/Studies:  Dg Chest 2 View  Result Date: 02/12/2017 CLINICAL DATA:  Central chest pain and shortness of breath. EXAM: CHEST  2 VIEW COMPARISON:  Radiograph 08/30/2016 FINDINGS: Lower lung volumes from prior exam leading to bronchovascular crowding. Unchanged cardiomegaly and mediastinal contours. Question of vascular congestion. No consolidation, pleural fluid or pneumothorax. Chronic degenerative change of both shoulders. IMPRESSION: Cardiomegaly. Low lung volumes with bronchovascular crowding, difficult to exclude vascular congestion. Electronically Signed   By: Rubye OaksMelanie  Ehinger M.D.   On: 02/12/2017 02:58   Ct Angio Chest Pe W Or Wo Contrast  Result Date: 02/12/2017 CLINICAL DATA:  Patient complains of chest pain beginning last night. EXAM: CT ANGIOGRAPHY CHEST CT ABDOMEN WITH CONTRAST TECHNIQUE: Multidetector CT imaging of the chest was performed using the standard protocol during bolus administration of intravenous contrast. Multiplanar CT image reconstructions and MIPs were obtained to evaluate the vascular anatomy. Multidetector CT imaging of the abdomen and pelvis was performed using  the standard protocol during bolus administration of intravenous contrast. CONTRAST:  30mL ISOVUE-300 IOPAMIDOL (ISOVUE-300) INJECTION 61% COMPARISON:  Compared to CTA of the chest, abdomen, and pelvis August 30, 2016 FINDINGS: CTA CHEST FINDINGS Cardiovascular: Cardiomegaly identified. Coronary artery calcifications are noted. Atherosclerotic changes associated with the thoracic aorta. No aneurysm or dissection. No pulmonary emboli. Mediastinum/Nodes: There is a moderate to large hiatal hernia. Visualized esophagus otherwise normal. No adenopathy. No effusion. Lungs/Pleura: Central airways are normal. No pneumothorax. A nodule in the lateral right lung base on series 9, image 56 is stable. Atelectasis adjacent to the left hemidiaphragm. No suspicious nodules, masses, or infiltrates otherwise identified. Musculoskeletal: See below Review of the MIP images confirms the above findings. CT ABDOMEN FINDINGS Hepatobiliary: There is a stone in the gallbladder. There is anterior wall thickening associated with the gallbladder measuring up to 6 mm, more prominent in the interval. No adjacent fat stranding. No pericholecystic fluid identified. No focal liver lesions. The portal vein is patent. Pancreas: Unremarkable. No pancreatic ductal dilatation or surrounding inflammatory changes. Spleen: Normal in size without focal abnormality. Adrenals/Urinary Tract: A small nodule in the right adrenal gland is likely an adenoma and is unchanged in the interval. It is too small to characterize however. The kidneys are normal in appearance as are the proximal ureters. Stomach/Bowel: There is a large hiatal hernia. Visualized stomach, small bowel, and colon are otherwise normal. Vascular/Lymphatic: Atherosclerotic changes seen in the tortuous non aneurysmal aorta. No adenopathy. Other: No abdominal wall hernia or abnormality. No abdominopelvic ascites. Musculoskeletal: No acute or significant osseous findings. Review of the MIP images  confirms the above findings. IMPRESSION: 1. No thoracic aortic aneurysm or dissection. No pulmonary emboli identified. 2. No acute abnormalities in the chest. 3. Cholelithiasis. Gallbladder wall thickening anteriorly is more prominent prominent in the interval. Acute cholecystitis is not excluded on this study. An ultrasound could further evaluate if clinically warranted. Electronically Signed   By: Gerome Samavid  Williams III M.D   On:  02/12/2017 17:41   Ct Abdomen W Contrast  Result Date: 02/12/2017 CLINICAL DATA:  Patient complains of chest pain beginning last night. EXAM: CT ANGIOGRAPHY CHEST CT ABDOMEN WITH CONTRAST TECHNIQUE: Multidetector CT imaging of the chest was performed using the standard protocol during bolus administration of intravenous contrast. Multiplanar CT image reconstructions and MIPs were obtained to evaluate the vascular anatomy. Multidetector CT imaging of the abdomen and pelvis was performed using the standard protocol during bolus administration of intravenous contrast. CONTRAST:  30mL ISOVUE-300 IOPAMIDOL (ISOVUE-300) INJECTION 61% COMPARISON:  Compared to CTA of the chest, abdomen, and pelvis August 30, 2016 FINDINGS: CTA CHEST FINDINGS Cardiovascular: Cardiomegaly identified. Coronary artery calcifications are noted. Atherosclerotic changes associated with the thoracic aorta. No aneurysm or dissection. No pulmonary emboli. Mediastinum/Nodes: There is a moderate to large hiatal hernia. Visualized esophagus otherwise normal. No adenopathy. No effusion. Lungs/Pleura: Central airways are normal. No pneumothorax. A nodule in the lateral right lung base on series 9, image 56 is stable. Atelectasis adjacent to the left hemidiaphragm. No suspicious nodules, masses, or infiltrates otherwise identified. Musculoskeletal: See below Review of the MIP images confirms the above findings. CT ABDOMEN FINDINGS Hepatobiliary: There is a stone in the gallbladder. There is anterior wall thickening associated  with the gallbladder measuring up to 6 mm, more prominent in the interval. No adjacent fat stranding. No pericholecystic fluid identified. No focal liver lesions. The portal vein is patent. Pancreas: Unremarkable. No pancreatic ductal dilatation or surrounding inflammatory changes. Spleen: Normal in size without focal abnormality. Adrenals/Urinary Tract: A small nodule in the right adrenal gland is likely an adenoma and is unchanged in the interval. It is too small to characterize however. The kidneys are normal in appearance as are the proximal ureters. Stomach/Bowel: There is a large hiatal hernia. Visualized stomach, small bowel, and colon are otherwise normal. Vascular/Lymphatic: Atherosclerotic changes seen in the tortuous non aneurysmal aorta. No adenopathy. Other: No abdominal wall hernia or abnormality. No abdominopelvic ascites. Musculoskeletal: No acute or significant osseous findings. Review of the MIP images confirms the above findings. IMPRESSION: 1. No thoracic aortic aneurysm or dissection. No pulmonary emboli identified. 2. No acute abnormalities in the chest. 3. Cholelithiasis. Gallbladder wall thickening anteriorly is more prominent prominent in the interval. Acute cholecystitis is not excluded on this study. An ultrasound could further evaluate if clinically warranted. Electronically Signed   By: Gerome Sam III M.D   On: 02/12/2017 17:41   Mr 3d Recon At Scanner  Result Date: 02/14/2017 CLINICAL DATA:  83 year old female with history of cholelithiasis. Evaluate for acute cholecystitis. EXAM: MRI ABDOMEN WITHOUT AND WITH CONTRAST (INCLUDING MRCP) TECHNIQUE: Multiplanar multisequence MR imaging of the abdomen was performed both before and after the administration of intravenous contrast. Heavily T2-weighted images of the biliary and pancreatic ducts were obtained, and three-dimensional MRCP images were rendered by post processing. CONTRAST:  20mL MULTIHANCE GADOBENATE DIMEGLUMINE 529 MG/ML  IV SOLN COMPARISON:  None. FINDINGS: Comment: Study is mildly limited by considerable patient respiratory motion. Lower chest: Unremarkable. Hepatobiliary: No discrete cystic or solid hepatic lesions. No intra or extrahepatic biliary ductal dilatation. Small filling defects in the gallbladder, compatible with a gallstone. MRCP images are considerably limited by patient respiratory motion. With these limitations in mind, there are no additional filling defects noted in the common bile duct to suggest choledocholithiasis. Common bile duct is normal in caliber measuring 6 mm in the porta hepatis. No intrahepatic biliary ductal dilatation. Gallbladder wall appears mildly thickened measuring up to 5 mm. However,  the gallbladder is under distended. No pericholecystic fluid. Pancreas: No pancreatic mass. No pancreatic ductal dilatation appreciated on today's limited MRCP images. No pancreatic or peripancreatic fluid or inflammatory changes. Spleen:  Unremarkable. Adrenals/Urinary Tract: Bilateral kidneys and bilateral adrenal glands are normal in appearance. There is no hydroureteronephrosis in the visualized portions of the abdomen. Stomach/Bowel: Visualized portions are unremarkable. Vascular/Lymphatic: Aortic atherosclerosis, without evidence of aneurysm in the visualized abdominal vasculature. No lymphadenopathy noted in the abdomen. Other: No significant volume of ascites noted in the visualized peritoneal cavity. Musculoskeletal: No aggressive osseous lesions are noted in the visualized portions of the skeleton. S-shaped thoracolumbar scoliosis convex to the right superiorly and to the left inferiorly. IMPRESSION: 1. Cholelithiasis. Although there is some mild gallbladder wall thickening, the gallbladder is under distended, and this is likely a contributing factor. There is no pericholecystic fluid or other surrounding inflammatory changes to suggest an acute cholecystitis at this time. 2. No choledocholithiasis or  evidence of biliary tract obstruction. Electronically Signed   By: Trudie Reed M.D.   On: 02/14/2017 08:50   Mr Abdomen Mrcp Vivien Rossetti Contast  Result Date: 02/14/2017 CLINICAL DATA:  83 year old female with history of cholelithiasis. Evaluate for acute cholecystitis. EXAM: MRI ABDOMEN WITHOUT AND WITH CONTRAST (INCLUDING MRCP) TECHNIQUE: Multiplanar multisequence MR imaging of the abdomen was performed both before and after the administration of intravenous contrast. Heavily T2-weighted images of the biliary and pancreatic ducts were obtained, and three-dimensional MRCP images were rendered by post processing. CONTRAST:  20mL MULTIHANCE GADOBENATE DIMEGLUMINE 529 MG/ML IV SOLN COMPARISON:  None. FINDINGS: Comment: Study is mildly limited by considerable patient respiratory motion. Lower chest: Unremarkable. Hepatobiliary: No discrete cystic or solid hepatic lesions. No intra or extrahepatic biliary ductal dilatation. Small filling defects in the gallbladder, compatible with a gallstone. MRCP images are considerably limited by patient respiratory motion. With these limitations in mind, there are no additional filling defects noted in the common bile duct to suggest choledocholithiasis. Common bile duct is normal in caliber measuring 6 mm in the porta hepatis. No intrahepatic biliary ductal dilatation. Gallbladder wall appears mildly thickened measuring up to 5 mm. However, the gallbladder is under distended. No pericholecystic fluid. Pancreas: No pancreatic mass. No pancreatic ductal dilatation appreciated on today's limited MRCP images. No pancreatic or peripancreatic fluid or inflammatory changes. Spleen:  Unremarkable. Adrenals/Urinary Tract: Bilateral kidneys and bilateral adrenal glands are normal in appearance. There is no hydroureteronephrosis in the visualized portions of the abdomen. Stomach/Bowel: Visualized portions are unremarkable. Vascular/Lymphatic: Aortic atherosclerosis, without evidence of  aneurysm in the visualized abdominal vasculature. No lymphadenopathy noted in the abdomen. Other: No significant volume of ascites noted in the visualized peritoneal cavity. Musculoskeletal: No aggressive osseous lesions are noted in the visualized portions of the skeleton. S-shaped thoracolumbar scoliosis convex to the right superiorly and to the left inferiorly. IMPRESSION: 1. Cholelithiasis. Although there is some mild gallbladder wall thickening, the gallbladder is under distended, and this is likely a contributing factor. There is no pericholecystic fluid or other surrounding inflammatory changes to suggest an acute cholecystitis at this time. 2. No choledocholithiasis or evidence of biliary tract obstruction. Electronically Signed   By: Trudie Reed M.D.   On: 02/14/2017 08:50   US Abdomen Limited Ruq  Result Date: 02/12/2017 CLINICAL DATA:  Hyperbilirubinemia.  Nausea and vomiting. EXAM: US ABDOMEN LIMITED - RIGHT UPPER QUADRANT COMPARISON:  None. FINDINGS: Gallbladder: No gallstones. No sludge. Wall appears mildly thickened where it abuts the liver, measuring 5 mm. No pericholecystic fluid.  Common bile duct: Diameter: 4.7 mm, not well visualized. Liver: Liver poorly visualized. Increased parenchymal echogenicity. No liver mass or focal lesion. IMPRESSION: 1. Exam limited by the patient's body habitus. 2. No convincing acute abnormality. No gallstones. No bile duct dilation. 3. Increased echogenicity of the liver consistent with fatty infiltration. Electronically Signed   By: Amie Portland M.D.   On: 02/12/2017 11:35    Assessment and Plan:   Chest pain with a moderate risk- Some of her symptoms sound like they could be cardiac. The fact that she has no epigastric tenderness on exam and her elevated Troponin makes me lean toward cardiac source  Cholecystitis- Pt presented with epigastric pain, WBC 15k and temp of 99.8. MRI this am  Moderate aortic stenosis- Doubt this is causing any  symptoms but may be an issue if she needs surgery  Elevated Troponin- relatively flat trend  HTN, HCVD- LVF on exam. She denies any history of HTN and was not on medication for this prior to admission. Her B/P has been running high.   Plan: Awaiting results of MRI  Signed, Corine Shelter, PA-C  02/14/2017 9:08 AM   Note: I just spoke with GI. MRI does not suggest acute cholecystitis.   ? Possible a passed stone. She may need further cardiac evaluation-cath. Will discuss with MD.   ATTENDING NOTE:  The patient was seen and examined, and I agree with the history, physical exam, assessment and plan as documented above, with modifications as noted below. I have also personally reviewed all relevant documentation, old records, labs, and both radiographic and cardiovascular studies. I have also independently interpreted old and new ECG's.  83 yr old woman with moderate to severe aortic stenosis admitted with epigastric pain and nausea with elevated LFT's and cholelithiasis as noted above. Low risk nuclear stress test in 08/2016.  ECG on admission which I personally interpreted demonstrated sinus rhythm with nonspecific ST-T abnormalities. Subsequent ECG showed sinus rhythm with LVH.  Troponins peaked at 0.45.  Chest pain was relieved with SL nitroglycerin.  It appears since her hospitalization in December, she has had 4 episodes of retrosternal chest pain in 2018 for which she did not come to the ED. Described as dull with radiation to the back. No associated shortness of breath but is accompanied by nausea.  As detailed above, now evidence for cholecystitis.  While overall clinical presentation may represent a passed stone, I wonder if her symptoms are primarily cardiac.  CT angio of the chest showed coronary artery calcifications and aortic atherosclerosis.  Given her repeated bouts of chest discomfort with mild troponin elevation and no clear explanation of her symptoms (and  alleviation with nitroglycerin), I think coronary angiography is warranted.  Risks and benefits of cardiac catheterization have been discussed with the patient.  These include bleeding, infection, kidney damage, stroke, heart attack, death.  The patient understands these risks and is willing to proceed.  I also told her that eventually she will require aortic valve replacement, perhaps TAVR.  She is on ASA. Will not start statins at this time given mildly elevated LFT's.  Prentice Docker, MD, [facility]  02/14/2017 11:00 AM

## 2017-02-14 NOTE — Interval H&P Note (Signed)
History and Physical Interval Note:  02/14/2017 3:10 PM  [name]  has presented today for cardiac catheterization, with the diagnosis of NSTEMI. The various methods of treatment have been discussed with the patient and family. After consideration of risks, benefits and other options for treatment, the patient has consented to  Procedure(s): Left Heart Cath and Coronary Angiography (N/A) as a surgical intervention .  [name] history has been reviewed, patient examined, no change in status, stable for surgery.  I have reviewed the patient's chart and labs.  Questions were answered to the patient's satisfaction.    Cath Lab Visit (complete for each Cath Lab visit)  Clinical Evaluation Leading to the Procedure:   ACS: Yes.  (NSTEMI)  Non-ACS: N/A  Mathew Storck

## 2017-02-14 NOTE — H&P (View-Only) (Signed)
Cardiology Consultation:   Patient ID: [name]; [id number]; 10-01-1932   Admit date: 02/12/2017 Date of Consult: 02/14/2017  Primary Care Provider: Kari Baars, MD Primary Cardiologist: Dr Diona Browner    Patient Profile:   [name] is a 83 y.o. female with a hx of chest pain who is being seen today for the evaluation of chest pain at the request of Dr Juanetta Gosling.  History of Present Illness:   [name] 83 y/o obese Caucasian widow, lives alone in her own home, a daughter lives nearby. She has no history of MI. She was admitted in Dec 2017 with similar chest pain. Myoview was low risk, echo showed HCVD and moderate AS with normal LVF, CT scan abdominal atherosclerosis but no aneurysm.  It was felt then that her pain was non cardiac. She did have a finding of incidental cholelithiasis without obstruction.   The pt is admitted now with epigastric pressure associated with nausea and vomiting for 3 hours 02/12/17. Her EKG showed no acute changes. She did say that she felt NTG SL helped. Her LFTs are elevated and an abdominal CT showed cholelithiasis with GB wall thickening, new from her last scan. Echo showed moderate to severe AS, LVH, normal LVF (no change). Her Troponin was also elevated as well with a peak of 0.45. She had an abdominal MRI this am.   Past Medical History:  Diagnosis Date  . Anxiety   . Arthritis   . Blood transfusion   . Chronic bronchitis   . Constipation   . Depression   . Hyperlipidemia   . Hypothyroidism     Past Surgical History:  Procedure Laterality Date  . EYE SURGERY  2009   bilateral  . HEMATOMA EVACUATION  06/04/2011   Procedure: EVACUATION HEMATOMA;  Surgeon: Fuller Canada, MD;  Location: [facility];  Service: Orthopedics;  Laterality: Right;  evacuation of seroma  . JOINT REPLACEMENT  05/2010   left total knee Dr. Romeo Apple  . Left hip replacement @ [facility] '98    . Left total knee    . PATELLAR TENDON REPAIR  06/04/2011     Procedure: PATELLA TENDON REPAIR;  Surgeon: Fuller Canada, MD;  Location: [facility];  Service: Orthopedics;  Laterality: Right;  . PATELLAR TENDON REPAIR  08/30/2011   Procedure: PATELLA TENDON REPAIR;  Surgeon: Fuller Canada, MD;  Location: [facility];  Service: Orthopedics;  Laterality: Right;  Allograft Reconstrucation Right Patella Tendon  . REPLACEMENT TOTAL KNEE BILATERAL    . TOTAL HIP ARTHROPLASTY    . TOTAL KNEE ARTHROPLASTY  05/03/2011   Procedure: TOTAL KNEE ARTHROPLASTY;  Surgeon: Fuller Canada, MD;  Location: [facility];  Service: Orthopedics;  Laterality: Right;  With DePuy  . TOTAL THYROIDECTOMY  March 2011   Dr. Suszanne Conners @ [facility]  . TUBAL LIGATION       Inpatient Medications: Scheduled Meds: . albuterol  2.5 mg Nebulization TID  . aspirin  325 mg Oral Daily  . darifenacin  15 mg Oral Daily  . levothyroxine  150 mcg Oral QAC breakfast  . PARoxetine  20 mg Oral QPM   Continuous Infusions: . famotidine (PEPCID) IV Stopped (02/13/17 2105)  . piperacillin-tazobactam Stopped (02/14/17 0857)   PRN Meds: HYDROmorphone, LORazepam, ondansetron (ZOFRAN) IV  Allergies:   No Known Allergies  Social History:   Social History   Social History  . Marital status: Widowed    Spouse name: N/A  . Number of children: N/A  . Years of education: N/A  Occupational History  . Not on file.   Social History Main Topics  . Smoking status: Never Smoker  . Smokeless tobacco: Never Used  . Alcohol use No  . Drug use: No  . Sexual activity: No   Other Topics Concern  . Not on file   Social History Narrative   ** Merged History Encounter **        Family History:   [name] family history includes Blindness in her sister; Colon cancer in her mother. There is no history of Anesthesia problems, Hypotension, Malignant hyperthermia, or Pseudochol deficiency.  ROS:  Please see the history of present illness.  All other ROS reviewed and negative.     Physical Exam/Data:    Vitals:   02/13/17 1523 02/13/17 2016 02/13/17 2150 02/14/17 0457  BP: (!) 160/65  (!) 172/63 128/90  Pulse: 76  97 79  Resp: 16  20 18   Temp: 99.1 F (37.3 C)  99.5 F (37.5 C) 99.3 F (37.4 C)  TempSrc: Oral  Oral Oral  SpO2: 96% 95% 98% 95%  Weight:      Height:        Intake/Output Summary (Last 24 hours) at 02/14/17 0908 Last data filed at 02/13/17 2100  Gross per 24 hour  Intake              580 ml  Output              950 ml  Net             -370 ml   Filed Weights   02/12/17 0141  Weight: 244 lb (110.7 kg)   Body mass index is 34.03 kg/m.  General:  Obese Caucasian female in no acute distress HEENT: normal Lymph: no adenopathy Neck: no JVD, transmitted bruit Endocrine:  No thryomegaly Vascular: FA pulses diminnished bilaterally without bruits  Cardiac:  normal S1, S2; RRR; 2/6 systolic murmur AOV Lungs:  clear to auscultation bilaterally, no wheezing, rhonchi or rales  Abd: soft, nontender, no hepatomegaly, no epigastric tenderness Ext: no edema Musculoskeletal: hallux valgus deformity Rt foot/toes,    BUE and BLE strength normal and equal Skin: warm and dry  Neuro:  CNs 2-12 intact, no focal abnormalities noted Psych:  Normal affect    EKG:  The EKG was personally reviewed and demonstrates NSR, no acute changes  Relevant CV Studies: Echo 02/13/17- Impressions:  - Moderate LVH with LVEF 65-70% and grade 1 diastolic dysfunction.   Mild left atrial enlargement. Moderate mitral annular   calcification with trivial mitral regurgitation. Moderate to   severe calcific aortic stenosis as outlined above. Mild tricuspid   regurgitation with PASP 50 mmHg.  Laboratory Data:  Chemistry Recent Labs Lab 02/12/17 0551 02/13/17 1007 02/14/17 0421  NA 139 141 139  K 3.4* 2.9* 4.1  CL 103 105 105  CO2 27 29 29   GLUCOSE 148* 139* 90  BUN 10 10 9   CREATININE 0.56 0.60 0.65  CALCIUM 8.5* 8.4* 8.3*  GFRNONAA >60 >60 >60  GFRAA >60 >60 >60  ANIONGAP 9 7  5      Recent Labs Lab 02/12/17 0551 02/13/17 1007 02/14/17 0421  PROT 6.7 6.3* 5.9*  ALBUMIN 3.5 3.1* 2.9*  AST 421* 191* 118*  ALT 333* 240* 188*  ALKPHOS 304* 242* 215*  BILITOT 3.2* 4.8* 2.1*   Hematology Recent Labs Lab 02/13/17 0121 02/13/17 1007 02/14/17 0421  WBC 14.5* 13.7* 9.4  RBC 4.35 4.62 4.46  HGB 12.4 13.3 12.5  HCT 37.4 39.9 38.8  MCV 86.0 86.4 87.0  MCH 28.5 28.8 28.0  MCHC 33.2 33.3 32.2  RDW 14.6 14.8 15.1  PLT 217 221 234   Cardiac Enzymes Recent Labs Lab 02/12/17 1342 02/12/17 1908 02/13/17 0121 02/13/17 1007 02/13/17 1529 02/13/17 2156  TROPONINI 0.27* 0.33* 0.26* 0.36* 0.43* 0.45*    Recent Labs Lab 02/12/17 0216  TROPIPOC 0.01    BNP Recent Labs Lab 02/12/17 0551  BNP 137.0*    DDimer  Recent Labs Lab 02/12/17 1342  DDIMER 4.14*    Radiology/Studies:  Dg Chest 2 View  Result Date: 02/12/2017 CLINICAL DATA:  Central chest pain and shortness of breath. EXAM: CHEST  2 VIEW COMPARISON:  Radiograph 08/30/2016 FINDINGS: Lower lung volumes from prior exam leading to bronchovascular crowding. Unchanged cardiomegaly and mediastinal contours. Question of vascular congestion. No consolidation, pleural fluid or pneumothorax. Chronic degenerative change of both shoulders. IMPRESSION: Cardiomegaly. Low lung volumes with bronchovascular crowding, difficult to exclude vascular congestion. Electronically Signed   By: Rubye OaksMelanie  Ehinger M.D.   On: 02/12/2017 02:58   Ct Angio Chest Pe W Or Wo Contrast  Result Date: 02/12/2017 CLINICAL DATA:  Patient complains of chest pain beginning last night. EXAM: CT ANGIOGRAPHY CHEST CT ABDOMEN WITH CONTRAST TECHNIQUE: Multidetector CT imaging of the chest was performed using the standard protocol during bolus administration of intravenous contrast. Multiplanar CT image reconstructions and MIPs were obtained to evaluate the vascular anatomy. Multidetector CT imaging of the abdomen and pelvis was performed using  the standard protocol during bolus administration of intravenous contrast. CONTRAST:  30mL ISOVUE-300 IOPAMIDOL (ISOVUE-300) INJECTION 61% COMPARISON:  Compared to CTA of the chest, abdomen, and pelvis August 30, 2016 FINDINGS: CTA CHEST FINDINGS Cardiovascular: Cardiomegaly identified. Coronary artery calcifications are noted. Atherosclerotic changes associated with the thoracic aorta. No aneurysm or dissection. No pulmonary emboli. Mediastinum/Nodes: There is a moderate to large hiatal hernia. Visualized esophagus otherwise normal. No adenopathy. No effusion. Lungs/Pleura: Central airways are normal. No pneumothorax. A nodule in the lateral right lung base on series 9, image 56 is stable. Atelectasis adjacent to the left hemidiaphragm. No suspicious nodules, masses, or infiltrates otherwise identified. Musculoskeletal: See below Review of the MIP images confirms the above findings. CT ABDOMEN FINDINGS Hepatobiliary: There is a stone in the gallbladder. There is anterior wall thickening associated with the gallbladder measuring up to 6 mm, more prominent in the interval. No adjacent fat stranding. No pericholecystic fluid identified. No focal liver lesions. The portal vein is patent. Pancreas: Unremarkable. No pancreatic ductal dilatation or surrounding inflammatory changes. Spleen: Normal in size without focal abnormality. Adrenals/Urinary Tract: A small nodule in the right adrenal gland is likely an adenoma and is unchanged in the interval. It is too small to characterize however. The kidneys are normal in appearance as are the proximal ureters. Stomach/Bowel: There is a large hiatal hernia. Visualized stomach, small bowel, and colon are otherwise normal. Vascular/Lymphatic: Atherosclerotic changes seen in the tortuous non aneurysmal aorta. No adenopathy. Other: No abdominal wall hernia or abnormality. No abdominopelvic ascites. Musculoskeletal: No acute or significant osseous findings. Review of the MIP images  confirms the above findings. IMPRESSION: 1. No thoracic aortic aneurysm or dissection. No pulmonary emboli identified. 2. No acute abnormalities in the chest. 3. Cholelithiasis. Gallbladder wall thickening anteriorly is more prominent prominent in the interval. Acute cholecystitis is not excluded on this study. An ultrasound could further evaluate if clinically warranted. Electronically Signed   By: Gerome Samavid  Williams III M.D   On:  02/12/2017 17:41   Ct Abdomen W Contrast  Result Date: 02/12/2017 CLINICAL DATA:  Patient complains of chest pain beginning last night. EXAM: CT ANGIOGRAPHY CHEST CT ABDOMEN WITH CONTRAST TECHNIQUE: Multidetector CT imaging of the chest was performed using the standard protocol during bolus administration of intravenous contrast. Multiplanar CT image reconstructions and MIPs were obtained to evaluate the vascular anatomy. Multidetector CT imaging of the abdomen and pelvis was performed using the standard protocol during bolus administration of intravenous contrast. CONTRAST:  30mL ISOVUE-300 IOPAMIDOL (ISOVUE-300) INJECTION 61% COMPARISON:  Compared to CTA of the chest, abdomen, and pelvis August 30, 2016 FINDINGS: CTA CHEST FINDINGS Cardiovascular: Cardiomegaly identified. Coronary artery calcifications are noted. Atherosclerotic changes associated with the thoracic aorta. No aneurysm or dissection. No pulmonary emboli. Mediastinum/Nodes: There is a moderate to large hiatal hernia. Visualized esophagus otherwise normal. No adenopathy. No effusion. Lungs/Pleura: Central airways are normal. No pneumothorax. A nodule in the lateral right lung base on series 9, image 56 is stable. Atelectasis adjacent to the left hemidiaphragm. No suspicious nodules, masses, or infiltrates otherwise identified. Musculoskeletal: See below Review of the MIP images confirms the above findings. CT ABDOMEN FINDINGS Hepatobiliary: There is a stone in the gallbladder. There is anterior wall thickening associated  with the gallbladder measuring up to 6 mm, more prominent in the interval. No adjacent fat stranding. No pericholecystic fluid identified. No focal liver lesions. The portal vein is patent. Pancreas: Unremarkable. No pancreatic ductal dilatation or surrounding inflammatory changes. Spleen: Normal in size without focal abnormality. Adrenals/Urinary Tract: A small nodule in the right adrenal gland is likely an adenoma and is unchanged in the interval. It is too small to characterize however. The kidneys are normal in appearance as are the proximal ureters. Stomach/Bowel: There is a large hiatal hernia. Visualized stomach, small bowel, and colon are otherwise normal. Vascular/Lymphatic: Atherosclerotic changes seen in the tortuous non aneurysmal aorta. No adenopathy. Other: No abdominal wall hernia or abnormality. No abdominopelvic ascites. Musculoskeletal: No acute or significant osseous findings. Review of the MIP images confirms the above findings. IMPRESSION: 1. No thoracic aortic aneurysm or dissection. No pulmonary emboli identified. 2. No acute abnormalities in the chest. 3. Cholelithiasis. Gallbladder wall thickening anteriorly is more prominent prominent in the interval. Acute cholecystitis is not excluded on this study. An ultrasound could further evaluate if clinically warranted. Electronically Signed   By: Gerome Sam III M.D   On: 02/12/2017 17:41   Mr 3d Recon At Scanner  Result Date: 02/14/2017 CLINICAL DATA:  83 year old female with history of cholelithiasis. Evaluate for acute cholecystitis. EXAM: MRI ABDOMEN WITHOUT AND WITH CONTRAST (INCLUDING MRCP) TECHNIQUE: Multiplanar multisequence MR imaging of the abdomen was performed both before and after the administration of intravenous contrast. Heavily T2-weighted images of the biliary and pancreatic ducts were obtained, and three-dimensional MRCP images were rendered by post processing. CONTRAST:  20mL MULTIHANCE GADOBENATE DIMEGLUMINE 529 MG/ML  IV SOLN COMPARISON:  None. FINDINGS: Comment: Study is mildly limited by considerable patient respiratory motion. Lower chest: Unremarkable. Hepatobiliary: No discrete cystic or solid hepatic lesions. No intra or extrahepatic biliary ductal dilatation. Small filling defects in the gallbladder, compatible with a gallstone. MRCP images are considerably limited by patient respiratory motion. With these limitations in mind, there are no additional filling defects noted in the common bile duct to suggest choledocholithiasis. Common bile duct is normal in caliber measuring 6 mm in the porta hepatis. No intrahepatic biliary ductal dilatation. Gallbladder wall appears mildly thickened measuring up to 5 mm. However,  the gallbladder is under distended. No pericholecystic fluid. Pancreas: No pancreatic mass. No pancreatic ductal dilatation appreciated on today's limited MRCP images. No pancreatic or peripancreatic fluid or inflammatory changes. Spleen:  Unremarkable. Adrenals/Urinary Tract: Bilateral kidneys and bilateral adrenal glands are normal in appearance. There is no hydroureteronephrosis in the visualized portions of the abdomen. Stomach/Bowel: Visualized portions are unremarkable. Vascular/Lymphatic: Aortic atherosclerosis, without evidence of aneurysm in the visualized abdominal vasculature. No lymphadenopathy noted in the abdomen. Other: No significant volume of ascites noted in the visualized peritoneal cavity. Musculoskeletal: No aggressive osseous lesions are noted in the visualized portions of the skeleton. S-shaped thoracolumbar scoliosis convex to the right superiorly and to the left inferiorly. IMPRESSION: 1. Cholelithiasis. Although there is some mild gallbladder wall thickening, the gallbladder is under distended, and this is likely a contributing factor. There is no pericholecystic fluid or other surrounding inflammatory changes to suggest an acute cholecystitis at this time. 2. No choledocholithiasis or  evidence of biliary tract obstruction. Electronically Signed   By: Trudie Reed M.D.   On: 02/14/2017 08:50   Mr Abdomen Mrcp Vivien Rossetti Contast  Result Date: 02/14/2017 CLINICAL DATA:  83 year old female with history of cholelithiasis. Evaluate for acute cholecystitis. EXAM: MRI ABDOMEN WITHOUT AND WITH CONTRAST (INCLUDING MRCP) TECHNIQUE: Multiplanar multisequence MR imaging of the abdomen was performed both before and after the administration of intravenous contrast. Heavily T2-weighted images of the biliary and pancreatic ducts were obtained, and three-dimensional MRCP images were rendered by post processing. CONTRAST:  20mL MULTIHANCE GADOBENATE DIMEGLUMINE 529 MG/ML IV SOLN COMPARISON:  None. FINDINGS: Comment: Study is mildly limited by considerable patient respiratory motion. Lower chest: Unremarkable. Hepatobiliary: No discrete cystic or solid hepatic lesions. No intra or extrahepatic biliary ductal dilatation. Small filling defects in the gallbladder, compatible with a gallstone. MRCP images are considerably limited by patient respiratory motion. With these limitations in mind, there are no additional filling defects noted in the common bile duct to suggest choledocholithiasis. Common bile duct is normal in caliber measuring 6 mm in the porta hepatis. No intrahepatic biliary ductal dilatation. Gallbladder wall appears mildly thickened measuring up to 5 mm. However, the gallbladder is under distended. No pericholecystic fluid. Pancreas: No pancreatic mass. No pancreatic ductal dilatation appreciated on today's limited MRCP images. No pancreatic or peripancreatic fluid or inflammatory changes. Spleen:  Unremarkable. Adrenals/Urinary Tract: Bilateral kidneys and bilateral adrenal glands are normal in appearance. There is no hydroureteronephrosis in the visualized portions of the abdomen. Stomach/Bowel: Visualized portions are unremarkable. Vascular/Lymphatic: Aortic atherosclerosis, without evidence of  aneurysm in the visualized abdominal vasculature. No lymphadenopathy noted in the abdomen. Other: No significant volume of ascites noted in the visualized peritoneal cavity. Musculoskeletal: No aggressive osseous lesions are noted in the visualized portions of the skeleton. S-shaped thoracolumbar scoliosis convex to the right superiorly and to the left inferiorly. IMPRESSION: 1. Cholelithiasis. Although there is some mild gallbladder wall thickening, the gallbladder is under distended, and this is likely a contributing factor. There is no pericholecystic fluid or other surrounding inflammatory changes to suggest an acute cholecystitis at this time. 2. No choledocholithiasis or evidence of biliary tract obstruction. Electronically Signed   By: Trudie Reed M.D.   On: 02/14/2017 08:50   US Abdomen Limited Ruq  Result Date: 02/12/2017 CLINICAL DATA:  Hyperbilirubinemia.  Nausea and vomiting. EXAM: US ABDOMEN LIMITED - RIGHT UPPER QUADRANT COMPARISON:  None. FINDINGS: Gallbladder: No gallstones. No sludge. Wall appears mildly thickened where it abuts the liver, measuring 5 mm. No pericholecystic fluid.  Common bile duct: Diameter: 4.7 mm, not well visualized. Liver: Liver poorly visualized. Increased parenchymal echogenicity. No liver mass or focal lesion. IMPRESSION: 1. Exam limited by the patient's body habitus. 2. No convincing acute abnormality. No gallstones. No bile duct dilation. 3. Increased echogenicity of the liver consistent with fatty infiltration. Electronically Signed   By: Amie Portland M.D.   On: 02/12/2017 11:35    Assessment and Plan:   Chest pain with a moderate risk- Some of her symptoms sound like they could be cardiac. The fact that she has no epigastric tenderness on exam and her elevated Troponin makes me lean toward cardiac source  Cholecystitis- Pt presented with epigastric pain, WBC 15k and temp of 99.8. MRI this am  Moderate aortic stenosis- Doubt this is causing any  symptoms but may be an issue if she needs surgery  Elevated Troponin- relatively flat trend  HTN, HCVD- LVF on exam. She denies any history of HTN and was not on medication for this prior to admission. Her B/P has been running high.   Plan: Awaiting results of MRI  Signed, Paige Shelter, PA-C  02/14/2017 9:08 AM   Note: I just spoke with GI. MRI does not suggest acute cholecystitis.   ? Possible a passed stone. She may need further cardiac evaluation-cath. Will discuss with MD.   ATTENDING NOTE:  The patient was seen and examined, and I agree with the history, physical exam, assessment and plan as documented above, with modifications as noted below. I have also personally reviewed all relevant documentation, old records, labs, and both radiographic and cardiovascular studies. I have also independently interpreted old and new ECG's.  83 yr old woman with moderate to severe aortic stenosis admitted with epigastric pain and nausea with elevated LFT's and cholelithiasis as noted above. Low risk nuclear stress test in 08/2016.  ECG on admission which I personally interpreted demonstrated sinus rhythm with nonspecific ST-T abnormalities. Subsequent ECG showed sinus rhythm with LVH.  Troponins peaked at 0.45.  Chest pain was relieved with SL nitroglycerin.  It appears since her hospitalization in December, she has had 4 episodes of retrosternal chest pain in 2018 for which she did not come to the ED. Described as dull with radiation to the back. No associated shortness of breath but is accompanied by nausea.  As detailed above, now evidence for cholecystitis.  While overall clinical presentation may represent a passed stone, I wonder if her symptoms are primarily cardiac.  CT angio of the chest showed coronary artery calcifications and aortic atherosclerosis.  Given her repeated bouts of chest discomfort with mild troponin elevation and no clear explanation of her symptoms (and  alleviation with nitroglycerin), I think coronary angiography is warranted.  Risks and benefits of cardiac catheterization have been discussed with the patient.  These include bleeding, infection, kidney damage, stroke, heart attack, death.  The patient understands these risks and is willing to proceed.  I also told her that eventually she will require aortic valve replacement, perhaps TAVR.  She is on ASA. Will not start statins at this time given mildly elevated LFT's.  Prentice Docker, MD, [facility]  02/14/2017 11:00 AM

## 2017-02-14 NOTE — Progress Notes (Signed)
Subjective: She is still having some chest pain. I saw her immediately after the MRA and report now shows that she does not have suggestion of acute cholecystitis on that. Cardiology has seen her since I saw her and plan is for coronary angiogram which I think is appropriate.  Objective: Vital signs in last 24 hours: Temp:  [99.1 F (37.3 C)-99.5 F (37.5 C)] 99.3 F (37.4 C) (05/21 0457) Pulse Rate:  [76-97] 79 (05/21 0457) Resp:  [16-20] 18 (05/21 0457) BP: (128-172)/(63-90) 128/90 (05/21 0457) SpO2:  [95 %-98 %] 95 % (05/21 0457) Weight change:  Last BM Date: 02/10/17  Intake/Output from previous day: 05/20 0701 - 05/21 0700 In: 38 [P.O.:720; IV Piggyback:100] Out: 950 [Urine:950]  PHYSICAL EXAM General appearance: alert, cooperative, mild distress and morbidly obese Resp: clear to auscultation bilaterally Cardio: regular rate and rhythm, S1, S2 normal, no murmur, click, rub or gallop GI: soft, non-tender; bowel sounds normal; no masses,  no organomegaly Extremities: extremities normal, atraumatic, no cyanosis or edema Skin warm and dry  Lab Results:  Results for orders placed or performed during the hospital encounter of 02/12/17 (from the past 48 hour(s))  Troponin I (q 6hr x 3)     Status: Abnormal   Collection Time: 02/12/17  1:42 PM  Result Value Ref Range   Troponin I 0.27 (HH) <0.03 ng/mL    Comment: CRITICAL VALUE NOTED.  VALUE IS CONSISTENT WITH PREVIOUSLY REPORTED AND CALLED VALUE.  D-dimer, quantitative (not at [facility])     Status: Abnormal   Collection Time: 02/12/17  1:42 PM  Result Value Ref Range   D-Dimer, Quant 4.14 (H) 0.00 - 0.50 ug/mL-FEU    Comment: (NOTE) At the manufacturer cut-off of 0.50 ug/mL FEU, this assay has been documented to exclude PE with a sensitivity and negative predictive value of 97 to 99%.  At this time, this assay has not been approved by the FDA to exclude DVT/VTE. Results should be correlated with clinical presentation.    Troponin I     Status: Abnormal   Collection Time: 02/12/17  7:08 PM  Result Value Ref Range   Troponin I 0.33 (HH) <0.03 ng/mL    Comment: CRITICAL RESULT CALLED TO, READ BACK BY AND VERIFIED WITH:  HANDY,T @ 2002 ON 02/12/17 BY JUW   CBC     Status: Abnormal   Collection Time: 02/13/17  1:21 AM  Result Value Ref Range   WBC 14.5 (H) 4.0 - 10.5 K/uL   RBC 4.35 3.87 - 5.11 MIL/uL   Hemoglobin 12.4 12.0 - 15.0 g/dL   HCT 37.4 36.0 - 46.0 %   MCV 86.0 78.0 - 100.0 fL   MCH 28.5 26.0 - 34.0 pg   MCHC 33.2 30.0 - 36.0 g/dL   RDW 14.6 11.5 - 15.5 %   Platelets 217 150 - 400 K/uL  Troponin I     Status: Abnormal   Collection Time: 02/13/17  1:21 AM  Result Value Ref Range   Troponin I 0.26 (HH) <0.03 ng/mL    Comment: CRITICAL VALUE NOTED.  VALUE IS CONSISTENT WITH PREVIOUSLY REPORTED AND CALLED VALUE.  Comprehensive metabolic panel     Status: Abnormal   Collection Time: 02/13/17 10:07 AM  Result Value Ref Range   Sodium 141 135 - 145 mmol/L   Potassium 2.9 (L) 3.5 - 5.1 mmol/L   Chloride 105 101 - 111 mmol/L   CO2 29 22 - 32 mmol/L   Glucose, Bld 139 (H) 65 - 99  mg/dL   BUN 10 6 - 20 mg/dL   Creatinine, Ser 0.60 0.44 - 1.00 mg/dL   Calcium 8.4 (L) 8.9 - 10.3 mg/dL   Total Protein 6.3 (L) 6.5 - 8.1 g/dL   Albumin 3.1 (L) 3.5 - 5.0 g/dL   AST 191 (H) 15 - 41 U/L   ALT 240 (H) 14 - 54 U/L   Alkaline Phosphatase 242 (H) 38 - 126 U/L   Total Bilirubin 4.8 (H) 0.3 - 1.2 mg/dL   GFR calc non Af Amer >60 >60 mL/min   GFR calc Af Amer >60 >60 mL/min    Comment: (NOTE) The eGFR has been calculated using the CKD EPI equation. This calculation has not been validated in all clinical situations. eGFR's persistently <60 mL/min signify possible Chronic Kidney Disease.    Anion gap 7 5 - 15  CBC with Differential/Platelet     Status: Abnormal   Collection Time: 02/13/17 10:07 AM  Result Value Ref Range   WBC 13.7 (H) 4.0 - 10.5 K/uL   RBC 4.62 3.87 - 5.11 MIL/uL   Hemoglobin 13.3  12.0 - 15.0 g/dL   HCT 39.9 36.0 - 46.0 %   MCV 86.4 78.0 - 100.0 fL   MCH 28.8 26.0 - 34.0 pg   MCHC 33.3 30.0 - 36.0 g/dL   RDW 14.8 11.5 - 15.5 %   Platelets 221 150 - 400 K/uL   Neutrophils Relative % 89 %   Neutro Abs 12.1 (H) 1.7 - 7.7 K/uL   Lymphocytes Relative 4 %   Lymphs Abs 0.6 (L) 0.7 - 4.0 K/uL   Monocytes Relative 7 %   Monocytes Absolute 0.9 0.1 - 1.0 K/uL   Eosinophils Relative 0 %   Eosinophils Absolute 0.0 0.0 - 0.7 K/uL   Basophils Relative 0 %   Basophils Absolute 0.0 0.0 - 0.1 K/uL  Troponin I (q 6hr x 3)     Status: Abnormal   Collection Time: 02/13/17 10:07 AM  Result Value Ref Range   Troponin I 0.36 (HH) <0.03 ng/mL    Comment: CRITICAL VALUE NOTED.  VALUE IS CONSISTENT WITH PREVIOUSLY REPORTED AND CALLED VALUE.  Troponin I (q 6hr x 3)     Status: Abnormal   Collection Time: 02/13/17  3:29 PM  Result Value Ref Range   Troponin I 0.43 (HH) <0.03 ng/mL    Comment: CRITICAL VALUE NOTED.  VALUE IS CONSISTENT WITH PREVIOUSLY REPORTED AND CALLED VALUE.  Troponin I (q 6hr x 3)     Status: Abnormal   Collection Time: 02/13/17  9:56 PM  Result Value Ref Range   Troponin I 0.45 (HH) <0.03 ng/mL    Comment: CRITICAL VALUE NOTED.  VALUE IS CONSISTENT WITH PREVIOUSLY REPORTED AND CALLED VALUE.  Comprehensive metabolic panel     Status: Abnormal   Collection Time: 02/14/17  4:21 AM  Result Value Ref Range   Sodium 139 135 - 145 mmol/L   Potassium 4.1 3.5 - 5.1 mmol/L    Comment: DELTA CHECK NOTED   Chloride 105 101 - 111 mmol/L   CO2 29 22 - 32 mmol/L   Glucose, Bld 90 65 - 99 mg/dL   BUN 9 6 - 20 mg/dL   Creatinine, Ser 0.65 0.44 - 1.00 mg/dL   Calcium 8.3 (L) 8.9 - 10.3 mg/dL   Total Protein 5.9 (L) 6.5 - 8.1 g/dL   Albumin 2.9 (L) 3.5 - 5.0 g/dL   AST 118 (H) 15 - 41 U/L   ALT  188 (H) 14 - 54 U/L   Alkaline Phosphatase 215 (H) 38 - 126 U/L   Total Bilirubin 2.1 (H) 0.3 - 1.2 mg/dL   GFR calc non Af Amer >60 >60 mL/min   GFR calc Af Amer >60 >60 mL/min     Comment: (NOTE) The eGFR has been calculated using the CKD EPI equation. This calculation has not been validated in all clinical situations. eGFR's persistently <60 mL/min signify possible Chronic Kidney Disease.    Anion gap 5 5 - 15  CBC with Differential/Platelet     Status: None   Collection Time: 02/14/17  4:21 AM  Result Value Ref Range   WBC 9.4 4.0 - 10.5 K/uL   RBC 4.46 3.87 - 5.11 MIL/uL   Hemoglobin 12.5 12.0 - 15.0 g/dL   HCT 38.8 36.0 - 46.0 %   MCV 87.0 78.0 - 100.0 fL   MCH 28.0 26.0 - 34.0 pg   MCHC 32.2 30.0 - 36.0 g/dL   RDW 15.1 11.5 - 15.5 %   Platelets 234 150 - 400 K/uL   Neutrophils Relative % 75 %   Neutro Abs 7.0 1.7 - 7.7 K/uL   Lymphocytes Relative 14 %   Lymphs Abs 1.3 0.7 - 4.0 K/uL   Monocytes Relative 8 %   Monocytes Absolute 0.8 0.1 - 1.0 K/uL   Eosinophils Relative 3 %   Eosinophils Absolute 0.2 0.0 - 0.7 K/uL   Basophils Relative 0 %   Basophils Absolute 0.0 0.0 - 0.1 K/uL    ABGS No results for input(s): PHART, PO2ART, TCO2, HCO3 in the last 72 hours.  Invalid input(s): PCO2 CULTURES No results found for this or any previous visit (from the past 240 hour(s)). Studies/Results: Ct Angio Chest Pe W Or Wo Contrast  Result Date: 02/12/2017 CLINICAL DATA:  Patient complains of chest pain beginning last night. EXAM: CT ANGIOGRAPHY CHEST CT ABDOMEN WITH CONTRAST TECHNIQUE: Multidetector CT imaging of the chest was performed using the standard protocol during bolus administration of intravenous contrast. Multiplanar CT image reconstructions and MIPs were obtained to evaluate the vascular anatomy. Multidetector CT imaging of the abdomen and pelvis was performed using the standard protocol during bolus administration of intravenous contrast. CONTRAST:  60m ISOVUE-300 IOPAMIDOL (ISOVUE-300) INJECTION 61% COMPARISON:  Compared to CTA of the chest, abdomen, and pelvis August 30, 2016 FINDINGS: CTA CHEST FINDINGS Cardiovascular: Cardiomegaly  identified. Coronary artery calcifications are noted. Atherosclerotic changes associated with the thoracic aorta. No aneurysm or dissection. No pulmonary emboli. Mediastinum/Nodes: There is a moderate to large hiatal hernia. Visualized esophagus otherwise normal. No adenopathy. No effusion. Lungs/Pleura: Central airways are normal. No pneumothorax. A nodule in the lateral right lung base on series 9, image 56 is stable. Atelectasis adjacent to the left hemidiaphragm. No suspicious nodules, masses, or infiltrates otherwise identified. Musculoskeletal: See below Review of the MIP images confirms the above findings. CT ABDOMEN FINDINGS Hepatobiliary: There is a stone in the gallbladder. There is anterior wall thickening associated with the gallbladder measuring up to 6 mm, more prominent in the interval. No adjacent fat stranding. No pericholecystic fluid identified. No focal liver lesions. The portal vein is patent. Pancreas: Unremarkable. No pancreatic ductal dilatation or surrounding inflammatory changes. Spleen: Normal in size without focal abnormality. Adrenals/Urinary Tract: A small nodule in the right adrenal gland is likely an adenoma and is unchanged in the interval. It is too small to characterize however. The kidneys are normal in appearance as are the proximal ureters. Stomach/Bowel: There is a  large hiatal hernia. Visualized stomach, small bowel, and colon are otherwise normal. Vascular/Lymphatic: Atherosclerotic changes seen in the tortuous non aneurysmal aorta. No adenopathy. Other: No abdominal wall hernia or abnormality. No abdominopelvic ascites. Musculoskeletal: No acute or significant osseous findings. Review of the MIP images confirms the above findings. IMPRESSION: 1. No thoracic aortic aneurysm or dissection. No pulmonary emboli identified. 2. No acute abnormalities in the chest. 3. Cholelithiasis. Gallbladder wall thickening anteriorly is more prominent prominent in the interval. Acute  cholecystitis is not excluded on this study. An ultrasound could further evaluate if clinically warranted. Electronically Signed   By: Dorise Bullion III M.D   On: 02/12/2017 17:41   Ct Abdomen W Contrast  Result Date: 02/12/2017 CLINICAL DATA:  Patient complains of chest pain beginning last night. EXAM: CT ANGIOGRAPHY CHEST CT ABDOMEN WITH CONTRAST TECHNIQUE: Multidetector CT imaging of the chest was performed using the standard protocol during bolus administration of intravenous contrast. Multiplanar CT image reconstructions and MIPs were obtained to evaluate the vascular anatomy. Multidetector CT imaging of the abdomen and pelvis was performed using the standard protocol during bolus administration of intravenous contrast. CONTRAST:  61m ISOVUE-300 IOPAMIDOL (ISOVUE-300) INJECTION 61% COMPARISON:  Compared to CTA of the chest, abdomen, and pelvis August 30, 2016 FINDINGS: CTA CHEST FINDINGS Cardiovascular: Cardiomegaly identified. Coronary artery calcifications are noted. Atherosclerotic changes associated with the thoracic aorta. No aneurysm or dissection. No pulmonary emboli. Mediastinum/Nodes: There is a moderate to large hiatal hernia. Visualized esophagus otherwise normal. No adenopathy. No effusion. Lungs/Pleura: Central airways are normal. No pneumothorax. A nodule in the lateral right lung base on series 9, image 56 is stable. Atelectasis adjacent to the left hemidiaphragm. No suspicious nodules, masses, or infiltrates otherwise identified. Musculoskeletal: See below Review of the MIP images confirms the above findings. CT ABDOMEN FINDINGS Hepatobiliary: There is a stone in the gallbladder. There is anterior wall thickening associated with the gallbladder measuring up to 6 mm, more prominent in the interval. No adjacent fat stranding. No pericholecystic fluid identified. No focal liver lesions. The portal vein is patent. Pancreas: Unremarkable. No pancreatic ductal dilatation or surrounding  inflammatory changes. Spleen: Normal in size without focal abnormality. Adrenals/Urinary Tract: A small nodule in the right adrenal gland is likely an adenoma and is unchanged in the interval. It is too small to characterize however. The kidneys are normal in appearance as are the proximal ureters. Stomach/Bowel: There is a large hiatal hernia. Visualized stomach, small bowel, and colon are otherwise normal. Vascular/Lymphatic: Atherosclerotic changes seen in the tortuous non aneurysmal aorta. No adenopathy. Other: No abdominal wall hernia or abnormality. No abdominopelvic ascites. Musculoskeletal: No acute or significant osseous findings. Review of the MIP images confirms the above findings. IMPRESSION: 1. No thoracic aortic aneurysm or dissection. No pulmonary emboli identified. 2. No acute abnormalities in the chest. 3. Cholelithiasis. Gallbladder wall thickening anteriorly is more prominent prominent in the interval. Acute cholecystitis is not excluded on this study. An ultrasound could further evaluate if clinically warranted. Electronically Signed   By: DDorise BullionIII M.D   On: 02/12/2017 17:41   Mr 3d Recon At Scanner  Result Date: 02/14/2017 CLINICAL DATA:  83year old female with history of cholelithiasis. Evaluate for acute cholecystitis. EXAM: MRI ABDOMEN WITHOUT AND WITH CONTRAST (INCLUDING MRCP) TECHNIQUE: Multiplanar multisequence MR imaging of the abdomen was performed both before and after the administration of intravenous contrast. Heavily T2-weighted images of the biliary and pancreatic ducts were obtained, and three-dimensional MRCP images were rendered by post processing.  CONTRAST:  67m MULTIHANCE GADOBENATE DIMEGLUMINE 529 MG/ML IV SOLN COMPARISON:  None. FINDINGS: Comment: Study is mildly limited by considerable patient respiratory motion. Lower chest: Unremarkable. Hepatobiliary: No discrete cystic or solid hepatic lesions. No intra or extrahepatic biliary ductal dilatation. Small  filling defects in the gallbladder, compatible with a gallstone. MRCP images are considerably limited by patient respiratory motion. With these limitations in mind, there are no additional filling defects noted in the common bile duct to suggest choledocholithiasis. Common bile duct is normal in caliber measuring 6 mm in the porta hepatis. No intrahepatic biliary ductal dilatation. Gallbladder wall appears mildly thickened measuring up to 5 mm. However, the gallbladder is under distended. No pericholecystic fluid. Pancreas: No pancreatic mass. No pancreatic ductal dilatation appreciated on today's limited MRCP images. No pancreatic or peripancreatic fluid or inflammatory changes. Spleen:  Unremarkable. Adrenals/Urinary Tract: Bilateral kidneys and bilateral adrenal glands are normal in appearance. There is no hydroureteronephrosis in the visualized portions of the abdomen. Stomach/Bowel: Visualized portions are unremarkable. Vascular/Lymphatic: Aortic atherosclerosis, without evidence of aneurysm in the visualized abdominal vasculature. No lymphadenopathy noted in the abdomen. Other: No significant volume of ascites noted in the visualized peritoneal cavity. Musculoskeletal: No aggressive osseous lesions are noted in the visualized portions of the skeleton. S-shaped thoracolumbar scoliosis convex to the right superiorly and to the left inferiorly. IMPRESSION: 1. Cholelithiasis. Although there is some mild gallbladder wall thickening, the gallbladder is under distended, and this is likely a contributing factor. There is no pericholecystic fluid or other surrounding inflammatory changes to suggest an acute cholecystitis at this time. 2. No choledocholithiasis or evidence of biliary tract obstruction. Electronically Signed   By: DVinnie LangtonM.D.   On: 02/14/2017 08:50   Mr Abdomen Mrcp WMoise BoringContast  Result Date: 02/14/2017 CLINICAL DATA:  83year old female with history of cholelithiasis. Evaluate for acute  cholecystitis. EXAM: MRI ABDOMEN WITHOUT AND WITH CONTRAST (INCLUDING MRCP) TECHNIQUE: Multiplanar multisequence MR imaging of the abdomen was performed both before and after the administration of intravenous contrast. Heavily T2-weighted images of the biliary and pancreatic ducts were obtained, and three-dimensional MRCP images were rendered by post processing. CONTRAST:  262mMULTIHANCE GADOBENATE DIMEGLUMINE 529 MG/ML IV SOLN COMPARISON:  None. FINDINGS: Comment: Study is mildly limited by considerable patient respiratory motion. Lower chest: Unremarkable. Hepatobiliary: No discrete cystic or solid hepatic lesions. No intra or extrahepatic biliary ductal dilatation. Small filling defects in the gallbladder, compatible with a gallstone. MRCP images are considerably limited by patient respiratory motion. With these limitations in mind, there are no additional filling defects noted in the common bile duct to suggest choledocholithiasis. Common bile duct is normal in caliber measuring 6 mm in the porta hepatis. No intrahepatic biliary ductal dilatation. Gallbladder wall appears mildly thickened measuring up to 5 mm. However, the gallbladder is under distended. No pericholecystic fluid. Pancreas: No pancreatic mass. No pancreatic ductal dilatation appreciated on today's limited MRCP images. No pancreatic or peripancreatic fluid or inflammatory changes. Spleen:  Unremarkable. Adrenals/Urinary Tract: Bilateral kidneys and bilateral adrenal glands are normal in appearance. There is no hydroureteronephrosis in the visualized portions of the abdomen. Stomach/Bowel: Visualized portions are unremarkable. Vascular/Lymphatic: Aortic atherosclerosis, without evidence of aneurysm in the visualized abdominal vasculature. No lymphadenopathy noted in the abdomen. Other: No significant volume of ascites noted in the visualized peritoneal cavity. Musculoskeletal: No aggressive osseous lesions are noted in the visualized portions of  the skeleton. S-shaped thoracolumbar scoliosis convex to the right superiorly and to the left inferiorly. IMPRESSION: 1.  Cholelithiasis. Although there is some mild gallbladder wall thickening, the gallbladder is under distended, and this is likely a contributing factor. There is no pericholecystic fluid or other surrounding inflammatory changes to suggest an acute cholecystitis at this time. 2. No choledocholithiasis or evidence of biliary tract obstruction. Electronically Signed   By: Vinnie Langton M.D.   On: 02/14/2017 08:50    Medications:  Prior to Admission:  Prescriptions Prior to Admission  Medication Sig Dispense Refill Last Dose  . ALPRAZolam (XANAX) 0.25 MG tablet Take 1 tablet by mouth 4 (four) times daily.  4 02/11/2017 at Unknown time  . aspirin EC 81 MG tablet Take 81 mg by mouth daily.   02/11/2017 at Unknown time  . furosemide (LASIX) 40 MG tablet Take 40 mg by mouth.   Past Week at Unknown time  . ibuprofen (ADVIL,MOTRIN) 200 MG tablet Take 600 mg by mouth daily as needed for moderate pain.   02/11/2017 at Unknown time  . levothyroxine (SYNTHROID, LEVOTHROID) 150 MCG tablet Take 150 mcg by mouth every morning.     02/11/2017 at Unknown time  . omeprazole (PRILOSEC) 20 MG capsule Take 20 mg by mouth daily.   02/11/2017 at Unknown time  . PARoxetine (PAXIL) 20 MG tablet Take 20 mg by mouth every evening.     02/11/2017 at Unknown time  . tolterodine (DETROL LA) 4 MG 24 hr capsule Take 4 mg by mouth daily.  10 02/11/2017 at Unknown time   Scheduled: . albuterol  2.5 mg Nebulization TID  . aspirin  325 mg Oral Daily  . darifenacin  15 mg Oral Daily  . levothyroxine  150 mcg Oral QAC breakfast  . PARoxetine  20 mg Oral QPM   Continuous: . famotidine (PEPCID) IV Stopped (02/14/17 1106)  . piperacillin-tazobactam Stopped (02/14/17 0857)   UUV:[id number], LORazepam, ondansetron (ZOFRAN) IV  Assesment:She came in with chest pain. She has a number of issues including elevated  liver function testing gallstones on abdominal CT. She has been seen by Dr. Bronson Ing and coronary angiogram is planned Principal Problem:   Chest pain with moderate risk of acute coronary syndrome Active Problems:   Hypothyroidism   Hyperlipidemia   Moderate aortic stenosis   Hypertensive cardiovascular disease   Depression   Anxiety   Hyperbilirubinemia   Gallstones   Hypokalemia   Biliary colic   Elevated liver enzymes    Plan: As above    LOS: 1 day   Derron Pipkins L 02/14/2017, 12:34 PM

## 2017-02-15 ENCOUNTER — Inpatient Hospital Stay (HOSPITAL_COMMUNITY): Payer: Medicare Other | Admitting: Anesthesiology

## 2017-02-15 ENCOUNTER — Other Ambulatory Visit: Payer: Self-pay

## 2017-02-15 ENCOUNTER — Encounter (HOSPITAL_COMMUNITY): Payer: Self-pay | Admitting: Internal Medicine

## 2017-02-15 ENCOUNTER — Encounter (HOSPITAL_COMMUNITY)
Admission: EM | Disposition: A | Discharge status: Home / Self Care | Payer: Self-pay | Source: Home / Self Care | Attending: Pulmonary Disease

## 2017-02-15 DIAGNOSIS — I10 Essential (primary) hypertension: Secondary | ICD-10-CM

## 2017-02-15 DIAGNOSIS — R945 Abnormal results of liver function studies: Principal | ICD-10-CM

## 2017-02-15 DIAGNOSIS — R7989 Other specified abnormal findings of blood chemistry: Secondary | ICD-10-CM

## 2017-02-15 HISTORY — PX: CHOLECYSTECTOMY: SHX55

## 2017-02-15 LAB — COMPREHENSIVE METABOLIC PANEL
ALBUMIN: 2.6 g/dL — AB (ref 3.5–5.0)
ALT: 116 U/L — ABNORMAL HIGH (ref 14–54)
AST: 41 U/L (ref 15–41)
Alkaline Phosphatase: 201 U/L — ABNORMAL HIGH (ref 38–126)
Anion gap: 9 (ref 5–15)
BUN: 12 mg/dL (ref 6–20)
CHLORIDE: 102 mmol/L (ref 101–111)
CO2: 26 mmol/L (ref 22–32)
Calcium: 8.4 mg/dL — ABNORMAL LOW (ref 8.9–10.3)
Creatinine, Ser: 0.75 mg/dL (ref 0.44–1.00)
GFR calc Af Amer: >60 mL/min (ref >60)
GFR calc non Af Amer: >60 mL/min (ref >60)
GLUCOSE: 110 mg/dL — AB (ref 65–99)
POTASSIUM: 3.5 mmol/L (ref 3.5–5.1)
Sodium: 137 mmol/L (ref 135–145)
TOTAL PROTEIN: 5.4 g/dL — AB (ref 6.5–8.1)
Total Bilirubin: 1.5 mg/dL — ABNORMAL HIGH (ref 0.3–1.2)

## 2017-02-15 LAB — CBC WITH DIFFERENTIAL/PLATELET
BASOS ABS: 0.1 10*3/uL (ref 0.0–0.1)
Basophils Relative: 1 %
EOS ABS: 0.3 10*3/uL (ref 0.0–0.7)
Eosinophils Relative: 3 %
HCT: 38.8 % (ref 36.0–46.0)
HEMOGLOBIN: 12.5 g/dL (ref 12.0–15.0)
LYMPHS ABS: 1.9 10*3/uL (ref 0.7–4.0)
LYMPHS PCT: 20 %
MCH: 27.8 pg (ref 26.0–34.0)
MCHC: 32.2 g/dL (ref 30.0–36.0)
MCV: 86.2 fL (ref 78.0–100.0)
Monocytes Absolute: 0.8 10*3/uL (ref 0.1–1.0)
Monocytes Relative: 8 %
NEUTROS PCT: 68 %
Neutro Abs: 6.2 10*3/uL (ref 1.7–7.7)
PLATELETS: 247 10*3/uL (ref 150–400)
RBC: 4.5 MIL/uL (ref 3.87–5.11)
RDW: 14.8 % (ref 11.5–15.5)
WBC: 9.2 10*3/uL (ref 4.0–10.5)

## 2017-02-15 LAB — SURGICAL PCR SCREEN
MRSA, PCR: NEGATIVE
Staphylococcus aureus: NEGATIVE

## 2017-02-15 SURGERY — LAPAROSCOPIC CHOLECYSTECTOMY WITH INTRAOPERATIVE CHOLANGIOGRAM
Anesthesia: General | Site: Abdomen

## 2017-02-15 MED ORDER — SODIUM CHLORIDE 0.9 % IR SOLN
Status: DC | PRN
  Administered 2017-02-15: 1000 mL

## 2017-02-15 MED ORDER — FENTANYL CITRATE (PF) 100 MCG/2ML IJ SOLN
INTRAMUSCULAR | Status: DC | PRN
  Administered 2017-02-15 (×2): 50 ug via INTRAVENOUS
  Administered 2017-02-15: 100 ug via INTRAVENOUS

## 2017-02-15 MED ORDER — ONDANSETRON HCL 4 MG/2ML IJ SOLN
INTRAMUSCULAR | Status: AC
  Filled 2017-02-15: qty 2

## 2017-02-15 MED ORDER — ROCURONIUM BROMIDE 10 MG/ML (PF) SYRINGE
PREFILLED_SYRINGE | INTRAVENOUS | Status: AC
  Filled 2017-02-15: qty 10

## 2017-02-15 MED ORDER — ROCURONIUM BROMIDE 100 MG/10ML IV SOLN
INTRAVENOUS | Status: DC | PRN
  Administered 2017-02-15: 50 mg via INTRAVENOUS

## 2017-02-15 MED ORDER — BUPIVACAINE HCL (PF) 0.25 % IJ SOLN
INTRAMUSCULAR | Status: AC
  Filled 2017-02-15: qty 30

## 2017-02-15 MED ORDER — ONDANSETRON HCL 4 MG/2ML IJ SOLN
INTRAMUSCULAR | Status: DC | PRN
Start: 2017-02-15 — End: 2017-02-15
  Administered 2017-02-15: 4 mg via INTRAVENOUS

## 2017-02-15 MED ORDER — EPHEDRINE SULFATE 50 MG/ML IJ SOLN
INTRAMUSCULAR | Status: DC | PRN
  Administered 2017-02-15: 5 mg via INTRAVENOUS

## 2017-02-15 MED ORDER — LIDOCAINE 2% (20 MG/ML) 5 ML SYRINGE
INTRAMUSCULAR | Status: AC
  Filled 2017-02-15: qty 10

## 2017-02-15 MED ORDER — 0.9 % SODIUM CHLORIDE (POUR BTL) OPTIME
TOPICAL | Status: DC | PRN
  Administered 2017-02-15: 1000 mL

## 2017-02-15 MED ORDER — PROPOFOL 10 MG/ML IV BOLUS
INTRAVENOUS | Status: DC | PRN
  Administered 2017-02-15: 150 mg via INTRAVENOUS

## 2017-02-15 MED ORDER — ONDANSETRON HCL 4 MG/2ML IJ SOLN: 4.0000 mg | Freq: Once | INTRAMUSCULAR | Status: DC | PRN

## 2017-02-15 MED ORDER — NEOSTIGMINE METHYLSULFATE 5 MG/5ML IV SOSY
PREFILLED_SYRINGE | INTRAVENOUS | Status: AC
  Filled 2017-02-15: qty 5

## 2017-02-15 MED ORDER — ACETAMINOPHEN 325 MG PO TABS
650.0000 mg | ORAL_TABLET | Freq: Four times a day (QID) | ORAL | Status: DC
  Administered 2017-02-15 – 2017-02-16 (×5): 650 mg via ORAL
  Filled 2017-02-15 (×4): qty 2

## 2017-02-15 MED ORDER — OXYCODONE HCL 5 MG PO TABS: 5.0000 mg | ORAL_TABLET | ORAL | Status: DC | PRN

## 2017-02-15 MED ORDER — LIDOCAINE HCL (CARDIAC) 20 MG/ML IV SOLN
INTRAVENOUS | Status: DC | PRN
  Administered 2017-02-15: 60 mg via INTRAVENOUS

## 2017-02-15 MED ORDER — BUPIVACAINE HCL 0.25 % IJ SOLN
INTRAMUSCULAR | Status: DC | PRN
  Administered 2017-02-15: 8 mL

## 2017-02-15 MED ORDER — GLYCOPYRROLATE 0.2 MG/ML IJ SOLN
INTRAMUSCULAR | Status: DC | PRN
  Administered 2017-02-15: 0.6 mg via INTRAVENOUS
  Administered 2017-02-15: 0.4 mg via INTRAVENOUS

## 2017-02-15 MED ORDER — PHENYLEPHRINE 40 MCG/ML (10ML) SYRINGE FOR IV PUSH (FOR BLOOD PRESSURE SUPPORT)
PREFILLED_SYRINGE | INTRAVENOUS | Status: AC
  Filled 2017-02-15: qty 10

## 2017-02-15 MED ORDER — SODIUM CHLORIDE 0.9 % IV SOLN
INTRAVENOUS | Status: DC | PRN
  Administered 2017-02-15: 40 ug via INTRAVENOUS

## 2017-02-15 MED ORDER — LACTATED RINGERS IV SOLN
INTRAVENOUS | Status: DC
  Administered 2017-02-15: 13:00:00 via INTRAVENOUS

## 2017-02-15 MED ORDER — CHLORHEXIDINE GLUCONATE CLOTH 2 % EX PADS
6.0000 | MEDICATED_PAD | Freq: Once | CUTANEOUS | Status: AC
  Administered 2017-02-15: 6 via TOPICAL

## 2017-02-15 MED ORDER — LABETALOL HCL 5 MG/ML IV SOLN
INTRAVENOUS | Status: AC
  Filled 2017-02-15: qty 4

## 2017-02-15 MED ORDER — KCL IN DEXTROSE-NACL 20-5-0.9 MEQ/L-%-% IV SOLN
INTRAVENOUS | Status: DC
  Administered 2017-02-15 – 2017-02-16 (×3): via INTRAVENOUS
  Filled 2017-02-15 (×3): qty 1000

## 2017-02-15 MED ORDER — PIPERACILLIN-TAZOBACTAM 3.375 G IVPB 30 MIN
3.3750 g | INTRAVENOUS | Status: AC
  Administered 2017-02-15: 3.375 g via INTRAVENOUS
  Filled 2017-02-15: qty 50

## 2017-02-15 MED ORDER — LEVOTHYROXINE SODIUM 75 MCG PO TABS
150.0000 ug | ORAL_TABLET | Freq: Every day | ORAL | Status: DC
  Administered 2017-02-16: 150 ug via ORAL
  Filled 2017-02-15: qty 2

## 2017-02-15 MED ORDER — FENTANYL CITRATE (PF) 250 MCG/5ML IJ SOLN
INTRAMUSCULAR | Status: AC
  Filled 2017-02-15: qty 5

## 2017-02-15 MED ORDER — PHENYLEPHRINE HCL 10 MG/ML IJ SOLN
INTRAVENOUS | Status: DC | PRN
  Administered 2017-02-15: 25 ug/min via INTRAVENOUS

## 2017-02-15 MED ORDER — NEOSTIGMINE METHYLSULFATE 10 MG/10ML IV SOLN
INTRAVENOUS | Status: DC | PRN
  Administered 2017-02-15: 4 mg via INTRAVENOUS

## 2017-02-15 MED ORDER — FENTANYL CITRATE (PF) 100 MCG/2ML IJ SOLN: 25.0000 ug | INTRAMUSCULAR | Status: DC | PRN

## 2017-02-15 MED ORDER — IOPAMIDOL (ISOVUE-300) INJECTION 61%
INTRAVENOUS | Status: AC
  Filled 2017-02-15: qty 50

## 2017-02-15 MED ORDER — LABETALOL HCL 5 MG/ML IV SOLN
10.0000 mg | INTRAVENOUS | Status: DC | PRN
  Administered 2017-02-15: 10 mg via INTRAVENOUS

## 2017-02-15 SURGICAL SUPPLY — 46 items
APL SKNCLS STERI-STRIP NONHPOA (GAUZE/BANDAGES/DRESSINGS) ×1
APPLIER CLIP 5 13 M/L LIGAMAX5 (MISCELLANEOUS) ×2
APR CLP MED LRG 5 ANG JAW (MISCELLANEOUS) ×1
BAG SPEC RTRVL LRG 6X4 10 (ENDOMECHANICALS) ×1
BENZOIN TINCTURE PRP APPL 2/3 (GAUZE/BANDAGES/DRESSINGS) ×2 IMPLANT
CANISTER SUCT 3000ML PPV (MISCELLANEOUS) ×2 IMPLANT
CHLORAPREP W/TINT 26ML (MISCELLANEOUS) ×2 IMPLANT
CLIP APPLIE 5 13 M/L LIGAMAX5 (MISCELLANEOUS) IMPLANT
CLIP LIGATING HEMO O LOK GREEN (MISCELLANEOUS) ×2 IMPLANT
COVER MAYO STAND STRL (DRAPES) ×2 IMPLANT
COVER SURGICAL LIGHT HANDLE (MISCELLANEOUS) ×2 IMPLANT
COVER TRANSDUCER ULTRASND (DRAPES) IMPLANT
DEVICE TROCAR PUNCTURE CLOSURE (ENDOMECHANICALS) ×2 IMPLANT
DRAPE C-ARM 42X72 X-RAY (DRAPES) ×1 IMPLANT
DRSG TEGADERM 2-3/8X2-3/4 SM (GAUZE/BANDAGES/DRESSINGS) ×1 IMPLANT
ELECT REM PT RETURN 9FT ADLT (ELECTROSURGICAL) ×2
ELECTRODE REM PT RTRN 9FT ADLT (ELECTROSURGICAL) ×1 IMPLANT
GAUZE SPONGE 2X2 8PLY STRL LF (GAUZE/BANDAGES/DRESSINGS) ×1 IMPLANT
GLOVE BIO SURGEON STRL SZ7.5 (GLOVE) ×2 IMPLANT
GOWN STRL REUS W/ TWL LRG LVL3 (GOWN DISPOSABLE) ×2 IMPLANT
GOWN STRL REUS W/ TWL XL LVL3 (GOWN DISPOSABLE) ×1 IMPLANT
GOWN STRL REUS W/TWL LRG LVL3 (GOWN DISPOSABLE) ×4
GOWN STRL REUS W/TWL XL LVL3 (GOWN DISPOSABLE) ×2
IV CATH 14GX2 1/4 (CATHETERS) ×1 IMPLANT
KIT BASIN OR (CUSTOM PROCEDURE TRAY) ×2 IMPLANT
KIT ROOM TURNOVER OR (KITS) ×2 IMPLANT
NDL INSUFFLATION 14GA 120MM (NEEDLE) ×1 IMPLANT
NEEDLE INSUFFLATION 14GA 120MM (NEEDLE) ×2 IMPLANT
NS IRRIG 1000ML POUR BTL (IV SOLUTION) ×2 IMPLANT
PAD ARMBOARD 7.5X6 YLW CONV (MISCELLANEOUS) ×4 IMPLANT
POUCH SPECIMEN RETRIEVAL 10MM (ENDOMECHANICALS) ×1 IMPLANT
SCISSORS LAP 5X35 DISP (ENDOMECHANICALS) ×2 IMPLANT
SET CHOLANGIOGRAPHY FRANKLIN (SET/KITS/TRAYS/PACK) ×1 IMPLANT
SET IRRIG TUBING LAPAROSCOPIC (IRRIGATION / IRRIGATOR) ×2 IMPLANT
SLEEVE ENDOPATH XCEL 5M (ENDOMECHANICALS) ×3 IMPLANT
SPECIMEN JAR SMALL (MISCELLANEOUS) ×2 IMPLANT
SPONGE GAUZE 2X2 STER 10/PKG (GAUZE/BANDAGES/DRESSINGS) ×1
STRIP CLOSURE SKIN 1/2X4 (GAUZE/BANDAGES/DRESSINGS) ×2 IMPLANT
SUT MNCRL AB 3-0 PS2 18 (SUTURE) ×2 IMPLANT
SUT MNCRL AB 4-0 PS2 18 (SUTURE) ×1 IMPLANT
TOWEL OR 17X24 6PK STRL BLUE (TOWEL DISPOSABLE) ×2 IMPLANT
TOWEL OR 17X26 10 PK STRL BLUE (TOWEL DISPOSABLE) ×2 IMPLANT
TRAY LAPAROSCOPIC MC (CUSTOM PROCEDURE TRAY) ×2 IMPLANT
TROCAR XCEL NON-BLD 11X100MML (ENDOMECHANICALS) ×2 IMPLANT
TROCAR XCEL NON-BLD 5MMX100MML (ENDOMECHANICALS) ×2 IMPLANT
TUBING INSUFFLATION (TUBING) ×2 IMPLANT

## 2017-02-15 NOTE — Progress Notes (Signed)
Pt back from PACU, four incision sites on abdomen with dressings.  Dressings are clean, dry, and intact.  Pt sleeping comfortably.

## 2017-02-15 NOTE — Anesthesia Procedure Notes (Signed)
Procedure Name: Intubation Date/Time: 02/15/2017 1:53 PM Performed by: Clearnce Sorrel Pre-anesthesia Checklist: Patient identified, Emergency Drugs available, Suction available, Patient being monitored and Timeout performed Patient Re-evaluated:Patient Re-evaluated prior to inductionOxygen Delivery Method: Circle system utilized Preoxygenation: Pre-oxygenation with 100% oxygen Intubation Type: IV induction Ventilation: Mask ventilation without difficulty and Oral airway inserted - appropriate to patient size Laryngoscope Size: Mac and 4 Grade View: Grade I Tube type: Oral Tube size: 7.0 mm Number of attempts: 1 Airway Equipment and Method: Stylet Placement Confirmation: ETT inserted through vocal cords under direct vision,  positive ETCO2 and breath sounds checked- equal and bilateral Secured at: 23 cm Tube secured with: Tape Dental Injury: Teeth and Oropharynx as per pre-operative assessment

## 2017-02-15 NOTE — Op Note (Signed)
02/15/2017  2:49 PM  PATIENT:  [name]  83 y.o. female  PRE-OPERATIVE DIAGNOSIS:  Cholelithiasis  POST-OPERATIVE DIAGNOSIS:  Chronic cholecystitis, Cholelithiasis  PROCEDURE:  Procedure(s): LAPAROSCOPIC CHOLECYSTECTOMY (N/A)  SURGEON:  Surgeon(s) and Role:    * Axel Filleramirez, Dionne Rossa, MD - Primary  PHYSICIAN ASSISTANT: Bailey MechLiz Simaan, PA-S   ANESTHESIA:   local and general  EBL:5cc   Total I/O In: 800 [I.V.:800] Out: -   BLOOD ADMINISTERED:none  DRAINS: none   LOCAL MEDICATIONS USED:  BUPIVICAINE   SPECIMEN:  Source of Specimen:  gallbladder  DISPOSITION OF SPECIMEN:  PATHOLOGY  COUNTS:  YES  TOURNIQUET:  * No tourniquets in log *  DICTATION: .Dragon Dictation The patient was taken to the operating and placed in the supine position with bilateral SCDs in place. The patient was prepped and draped in the usual sterile fashion. A time out was called and all facts were verified. A pneumoperitoneum was obtained via A Veress needle technique to a pressure of 14mm of mercury.  A 5mm trochar was then placed in the right upper quadrant under visualization, and there were no injuries to any abdominal organs. A 11 mm port was then placed in the umbilical region after infiltrating with local anesthesia under direct visualization. A second and third epigastric port and right lower quadrant port placement under direct visualization, respectively. The liver was very fatty and large.   The gallbladder was identified and retracted, the peritoneum was then sharply dissected from the gallbladder and this dissection was carried down to Calot's triangle. The gallbladder was identified and stripped away circumferentially and seen going into the gallbladder 360, the critical angle was obtained.  2 clips were placed proximally one distally and the cystic duct transected. The cystic artery was identified and 2 clips placed proximally and one distally and transected. We then proceeded to  remove the gallbladder off the hepatic fossa with Bovie cautery. A retrieval bag was then placed in the abdomen and gallbladder placed in the bag. The hepatic fossa was then reexamined and hemostasis was achieved with Bovie cautery and was excellent at the end of the case. The subhepatic fossa and perihepatic fossa was then irrigated until the effluent was clear.  The gallbladder and bag were removed from the abdominal cavity. The 11 mm trocar fascia was reapproximated with the Endo Close #1 Vicryl x2. The pneumoperitoneum was evacuated and all trochars removed under direct visulalization. The skin was then closed with 4-0 Monocryl and the skin dressed with Steri-Strips, gauze, and tape. The patient was awaken from general anesthesia and taken to the recovery room in stable condition.   PLAN OF CARE: Admit for overnight observation  PATIENT DISPOSITION:  PACU - hemodynamically stable.   Delay start of Pharmacological VTE agent (>24hrs) due to surgical blood loss or risk of bleeding: no

## 2017-02-15 NOTE — Progress Notes (Signed)
Patient states she gets up at home into a wheelchair- not bed bound. Patient is incontinent, but will use bed pan as well. No other concerns or complaints.

## 2017-02-15 NOTE — Progress Notes (Addendum)
Pt left for surgery at 1pm. Consent has been done, surgical PCR has been done, CHG bath has been done,report provided to short stay RN

## 2017-02-15 NOTE — Progress Notes (Addendum)
Progress Note  Patient Name: [name] Date of Encounter: 02/15/2017  Primary Cardiologist: Dr. Percell Belt  Subjective   No complaints of chest pain or SOB  Inpatient Medications    Scheduled Meds: . aspirin  81 mg Oral Daily  . darifenacin  15 mg Oral Daily  . enoxaparin (LOVENOX) injection  40 mg Subcutaneous Q24H  . levothyroxine  150 mcg Oral QAC breakfast  . PARoxetine  20 mg Oral QPM  . sodium chloride flush  3 mL Intravenous Q12H   Continuous Infusions: . sodium chloride    . famotidine (PEPCID) IV Stopped (02/14/17 2321)  . piperacillin-tazobactam Stopped (02/15/17 0540)   PRN Meds: sodium chloride, albuterol, HYDROmorphone, LORazepam, ondansetron (ZOFRAN) IV, sodium chloride flush   Vital Signs    Vitals:   02/14/17 2028 02/15/17 0021 02/15/17 0702 02/15/17 0804  BP: (!) 154/96 (!) 155/69 (!) 162/77 (!) 160/73  Pulse: 75 69 79 (!) 54  Resp: 18 18 18    Temp: 97.9 F (36.6 C) 97.9 F (36.6 C) 97.9 F (36.6 C) 98.1 F (36.7 C)  TempSrc: Oral Oral Oral Oral  SpO2: 97% 95% 95% 98%  Weight:   251 lb 1.6 oz (113.9 kg)   Height:        Intake/Output Summary (Last 24 hours) at 02/15/17 0824 Last data filed at 02/15/17 0600  Gross per 24 hour  Intake             1033 ml  Output              500 ml  Net              533 ml   Filed Weights   02/12/17 0141 02/14/17 1424 02/15/17 0702  Weight: 244 lb (110.7 kg) 250 lb 7.1 oz (113.6 kg) 251 lb 1.6 oz (113.9 kg)    Telemetry    NSR - Personally Reviewed  ECG    NSR - Personally Reviewed  Physical Exam   GEN: No acute distress.   Neck: No JVD Cardiac: RRR, no  rubs, or gallops. 2/6 SM at RUSB  Respiratory: Clear to auscultation bilaterally. GI: Soft, nontender, non-distended  MS: No edema; No deformity. Neuro:  Nonfocal  Psych: Normal affect   Labs    Chemistry Recent Labs Lab 02/13/17 1007 02/14/17 0421 02/15/17 0535  NA 141 139 137  K 2.9* 4.1 3.5  CL 105 105 102  CO2 29  29 26   GLUCOSE 139* 90 110*  BUN 10 9 12   CREATININE 0.60 0.65 0.75  CALCIUM 8.4* 8.3* 8.4*  PROT 6.3* 5.9* 5.4*  ALBUMIN 3.1* 2.9* 2.6*  AST 191* 118* 41  ALT 240* 188* 116*  ALKPHOS 242* 215* 201*  BILITOT 4.8* 2.1* 1.5*  GFRNONAA >60 >60 >60  GFRAA >60 >60 >60  ANIONGAP 7 5 9      Hematology Recent Labs Lab 02/13/17 1007 02/14/17 0421 02/15/17 0535  WBC 13.7* 9.4 9.2  RBC 4.62 4.46 4.50  HGB 13.3 12.5 12.5  HCT 39.9 38.8 38.8  MCV 86.4 87.0 86.2  MCH 28.8 28.0 27.8  MCHC 33.3 32.2 32.2  RDW 14.8 15.1 14.8  PLT 221 234 247    Cardiac Enzymes Recent Labs Lab 02/13/17 0121 02/13/17 1007 02/13/17 1529 02/13/17 2156  TROPONINI 0.26* 0.36* 0.43* 0.45*    Recent Labs Lab 02/12/17 0216  TROPIPOC 0.01     BNP Recent Labs Lab 02/12/17 0551  BNP 137.0*     DDimer  Recent Labs  Lab 02/12/17 1342  DDIMER 4.14*     Radiology    Mr 3d Recon At Scanner  Result Date: 02/14/2017 CLINICAL DATA:  83 year old female with history of cholelithiasis. Evaluate for acute cholecystitis. EXAM: MRI ABDOMEN WITHOUT AND WITH CONTRAST (INCLUDING MRCP) TECHNIQUE: Multiplanar multisequence MR imaging of the abdomen was performed both before and after the administration of intravenous contrast. Heavily T2-weighted images of the biliary and pancreatic ducts were obtained, and three-dimensional MRCP images were rendered by post processing. CONTRAST:  20mL MULTIHANCE GADOBENATE DIMEGLUMINE 529 MG/ML IV SOLN COMPARISON:  None. FINDINGS: Comment: Study is mildly limited by considerable patient respiratory motion. Lower chest: Unremarkable. Hepatobiliary: No discrete cystic or solid hepatic lesions. No intra or extrahepatic biliary ductal dilatation. Small filling defects in the gallbladder, compatible with a gallstone. MRCP images are considerably limited by patient respiratory motion. With these limitations in mind, there are no additional filling defects noted in the common bile duct to  suggest choledocholithiasis. Common bile duct is normal in caliber measuring 6 mm in the porta hepatis. No intrahepatic biliary ductal dilatation. Gallbladder wall appears mildly thickened measuring up to 5 mm. However, the gallbladder is under distended. No pericholecystic fluid. Pancreas: No pancreatic mass. No pancreatic ductal dilatation appreciated on today's limited MRCP images. No pancreatic or peripancreatic fluid or inflammatory changes. Spleen:  Unremarkable. Adrenals/Urinary Tract: Bilateral kidneys and bilateral adrenal glands are normal in appearance. There is no hydroureteronephrosis in the visualized portions of the abdomen. Stomach/Bowel: Visualized portions are unremarkable. Vascular/Lymphatic: Aortic atherosclerosis, without evidence of aneurysm in the visualized abdominal vasculature. No lymphadenopathy noted in the abdomen. Other: No significant volume of ascites noted in the visualized peritoneal cavity. Musculoskeletal: No aggressive osseous lesions are noted in the visualized portions of the skeleton. S-shaped thoracolumbar scoliosis convex to the right superiorly and to the left inferiorly. IMPRESSION: 1. Cholelithiasis. Although there is some mild gallbladder wall thickening, the gallbladder is under distended, and this is likely a contributing factor. There is no pericholecystic fluid or other surrounding inflammatory changes to suggest an acute cholecystitis at this time. 2. No choledocholithiasis or evidence of biliary tract obstruction. Electronically Signed   By: Trudie Reed M.D.   On: 02/14/2017 08:50   Mr Abdomen Mrcp Vivien Rossetti Contast  Result Date: 02/14/2017 CLINICAL DATA:  83 year old female with history of cholelithiasis. Evaluate for acute cholecystitis. EXAM: MRI ABDOMEN WITHOUT AND WITH CONTRAST (INCLUDING MRCP) TECHNIQUE: Multiplanar multisequence MR imaging of the abdomen was performed both before and after the administration of intravenous contrast. Heavily T2-weighted  images of the biliary and pancreatic ducts were obtained, and three-dimensional MRCP images were rendered by post processing. CONTRAST:  20mL MULTIHANCE GADOBENATE DIMEGLUMINE 529 MG/ML IV SOLN COMPARISON:  None. FINDINGS: Comment: Study is mildly limited by considerable patient respiratory motion. Lower chest: Unremarkable. Hepatobiliary: No discrete cystic or solid hepatic lesions. No intra or extrahepatic biliary ductal dilatation. Small filling defects in the gallbladder, compatible with a gallstone. MRCP images are considerably limited by patient respiratory motion. With these limitations in mind, there are no additional filling defects noted in the common bile duct to suggest choledocholithiasis. Common bile duct is normal in caliber measuring 6 mm in the porta hepatis. No intrahepatic biliary ductal dilatation. Gallbladder wall appears mildly thickened measuring up to 5 mm. However, the gallbladder is under distended. No pericholecystic fluid. Pancreas: No pancreatic mass. No pancreatic ductal dilatation appreciated on today's limited MRCP images. No pancreatic or peripancreatic fluid or inflammatory changes. Spleen:  Unremarkable. Adrenals/Urinary Tract: Bilateral kidneys  and bilateral adrenal glands are normal in appearance. There is no hydroureteronephrosis in the visualized portions of the abdomen. Stomach/Bowel: Visualized portions are unremarkable. Vascular/Lymphatic: Aortic atherosclerosis, without evidence of aneurysm in the visualized abdominal vasculature. No lymphadenopathy noted in the abdomen. Other: No significant volume of ascites noted in the visualized peritoneal cavity. Musculoskeletal: No aggressive osseous lesions are noted in the visualized portions of the skeleton. S-shaped thoracolumbar scoliosis convex to the right superiorly and to the left inferiorly. IMPRESSION: 1. Cholelithiasis. Although there is some mild gallbladder wall thickening, the gallbladder is under distended, and this  is likely a contributing factor. There is no pericholecystic fluid or other surrounding inflammatory changes to suggest an acute cholecystitis at this time. 2. No choledocholithiasis or evidence of biliary tract obstruction. Electronically Signed   By: Trudie Reedaniel  Entrikin M.D.   On: 02/14/2017 08:50    Cardiac Studies   Cardiac cath 02/14/2017 Conclusion: 1. Mild, nonobstructive coronary artery disease with very large, tortuous coronary arteries. No culprit lesion identified for the patient's elevated troponin. 2. Calcified aortic valve. Aortic valve was not crossed due to significant tortuosity of the right brachiocephalic and subclavian arteries as well as known aortic stenosis.  Recommendations: 1. Medical therapy for troponin elevation, which likely represents supply-demand mismatch in the setting of acute illness. 2. If further hemodynamic assessment of the aortic valve is necessary, consider left and right heart catheterization from femoral artery approach in the future.  2D echo 02/13/2017 Study Conclusions  - Left ventricle: The cavity size was normal. Wall thickness was   increased in a pattern of moderate LVH. Systolic function was   vigorous. The estimated ejection fraction was in the range of 65%   to 70%. Wall motion was normal; there were no regional wall   motion abnormalities. Doppler parameters are consistent with   abnormal left ventricular relaxation (grade 1 diastolic   dysfunction). - Aortic valve: Moderately to severely calcified annulus. Severely   calcified leaflets. There was moderate to severe stenosis. Mean   gradient (S): 27 mm Hg. Peak gradient (S): 48 mm Hg. VTI ratio of   LVOT to aortic valve: 0.4. Valve area (VTI): 1.26 cm^2. Valve   area (Vmax): 1.17 cm^2. Valve area (Vmean): 1.15 cm^2. - Mitral valve: Moderately calcified annulus. There was trivial   regurgitation. - Left atrium: The atrium was mildly dilated. - Right atrium: Central venous pressure  (est): 3 mm Hg. - Tricuspid valve: There was mild regurgitation. - Pulmonary arteries: PA peak pressure: 50 mm Hg (S). - Pericardium, extracardiac: There was no pericardial effusion.  Impressions:  - Moderate LVH with LVEF 65-70% and grade 1 diastolic dysfunction.   Mild left atrial enlargement. Moderate mitral annular   calcification with trivial mitral regurgitation. Moderate to   severe calcific aortic stenosis as outlined above. Mild tricuspid   regurgitation with PASP 50 mmHg.  ------------------------------------------------------------------- Study data:  Comparison was made to the study of 08/30/2016.  Study status:  Routine.  Procedure:  The patient reported no pain pre or post test. Transthoracic echocardiography. Image quality was adequate.  Study completion:  There were no complications. Transthoracic echocardiography.  M-mode, complete 2D, spectral Doppler, and color Doppler.  Birthdate:  Patient birthdate: 10-28-1932.  Age:  Patient is 83 yr old.  Sex:  Gender: female. BMI: 34 kg/m^2.  Blood pressure:     165/53  Patient status: Inpatient.  Study date:  Study date: 02/13/2017. Study time: 11:10 AM.  Location:  Bedside.  -------------------------------------------------------------------  ------------------------------------------------------------------- Left ventricle:  The cavity size was normal. Wall thickness was increased in a pattern of moderate LVH. Systolic function was vigorous. The estimated ejection fraction was in the range of 65% to 70%. Wall motion was normal; there were no regional wall motion abnormalities. Doppler parameters are consistent with abnormal left ventricular relaxation (grade 1 diastolic dysfunction).  Patient Profile     83 y.o. female with a history of moderate AS, abdominal atherosclerosis, CP with normal myoview in 08/2016 now admitted with epigastric pain with N/V, elevated LFTs, cholelithiasis with GB thickening by CT and  elevated trop.    Assessment & Plan    Chest pain with a moderate risk- - EKG is nonischemic but Trop mildly elevated and likely related to demand ischemia in setting of abdominal process - cath yesterday with no significant CAD - CP likely related to cholecystitis - no further cardiac workup at this time  Cholelithiasis- - Pt presented with epigastric pain, WBC 15k and temp of 99.8 and elevated LFTs.  - MRI showed cholelithiasis with GB thickening but no distension of the GB and no evidence of acute cholecystitis and no evidence of biliary obstruction  Moderate aortic stenosis- - Doubt this is causing any symptoms  - In review of echo from 08/2016 and 02/13/2017 the mean AVG is stable at 26- with AVA 1.26-1.41cm2 c/w moderate AS.    HTN, HCVD- She denies any history of HTN and was not on medication for this prior to   admission.  - BP remains poorly controlled.  - start amlodipine 5mg  daily  Her symptoms are related to noncardiac problem so will ask [facility] to take  over care.  If surgery is required for GB disease she would be moderate risk from a cardiac standpoint due to her moderate AS.  She has no signifcant CAD on cath. Her episodes of CP in December and this admission are more consistent with GB disease than AS and in setting of significant elevation in LFTs likely represents passing a gallstone.  Need to avoid volume overload as well as Hypotension during surgery in setting of moderate AS.  Signed, Armanda Magic, MD  02/15/2017, 8:24 AM

## 2017-02-15 NOTE — Anesthesia Postprocedure Evaluation (Signed)
Anesthesia Post Note  Patient: [name]  Procedure(s) Performed: Procedure(s) (LRB): LAPAROSCOPIC CHOLECYSTECTOMY (N/A)  Patient location during evaluation: PACU Anesthesia Type: General Level of consciousness: awake and alert Pain management: pain level controlled Vital Signs Assessment: post-procedure vital signs reviewed and stable Respiratory status: spontaneous breathing, nonlabored ventilation, respiratory function stable and patient connected to nasal cannula oxygen Cardiovascular status: blood pressure returned to baseline and stable Postop Assessment: no signs of nausea or vomiting Anesthetic complications: no       Last Vitals:  Vitals:   02/15/17 1535 02/15/17 1604  BP: (!) 187/56 (!) 188/74  Pulse: 79 (!) 57  Resp: 19 18  Temp: 36.6 C     Last Pain:  Vitals:   02/15/17 1131  TempSrc: Oral  PainSc:                  Britney Newstrom P Aison Malveaux

## 2017-02-15 NOTE — Telephone Encounter (Signed)
Lab orders and letter mailed to pt to do around 03/01/2017.

## 2017-02-15 NOTE — Transfer of Care (Signed)
Immediate Anesthesia Transfer of Care Note  Patient: [name]  Procedure(s) Performed: Procedure(s): LAPAROSCOPIC CHOLECYSTECTOMY (N/A)  Patient Location: PACU  Anesthesia Type:General  Level of Consciousness: patient cooperative and responds to stimulation  Airway & Oxygen Therapy: Patient Spontanous Breathing and Patient connected to face mask oxygen  Post-op Assessment: Report given to RN and Post -op Vital signs reviewed and stable  Post vital signs: Reviewed and stable  Last Vitals:  Vitals:   02/15/17 0804 02/15/17 1131  BP: (!) 160/73 (!) 124/45  Pulse: (!) 54 60  Resp:    Temp: 36.7 C 36.8 C    Last Pain:  Vitals:   02/15/17 1131  TempSrc: Oral  PainSc:       Patients Stated Pain Goal: 0 (02/13/17 2139)  Complications: No apparent anesthesia complications

## 2017-02-15 NOTE — Anesthesia Preprocedure Evaluation (Addendum)
Anesthesia Evaluation  Patient identified by MRN, date of birth, ID band Patient awake    Reviewed: Allergy & Precautions, NPO status , Patient's Chart, lab work & pertinent test results  Airway Mallampati: III  TM Distance: >3 FB Neck ROM: Full    Dental no notable dental hx. (+) Edentulous Upper, Edentulous Lower   Pulmonary  Chronic bronchitis   Pulmonary exam normal breath sounds clear to auscultation       Cardiovascular Exercise Tolerance: Poor + CAD  Normal cardiovascular exam+ Valvular Problems/Murmurs AS  Rhythm:Regular Rate:Normal  Pre-op eval per Cardiology Mayford Knife([facility]) Elevated Troponins ECG: SR, rate 80 ECHO: - Moderate LVH with LVEF 65-70% and grade 1 diastolic dysfunction.Mild left atrial enlargement. Moderate mitral annular calcification with trivial mitral regurgitation. Moderate to severe calcific aortic stenosis as outlined above. Mild tricuspid regurgitation with PASP 50 mmHg. Cath:02/14/2017 Conclusion: 1. Mild, nonobstructive coronary artery disease with very large, tortuous coronary arteries. No culprit lesion identified for the patient's elevated troponin. 2. Calcified aortic valve. Aortic valve was not crossed due to significant tortuosity of the right brachiocephalic and subclavian arteries as well as known aortic stenosis.    Neuro/Psych PSYCHIATRIC DISORDERS Anxiety Depression negative neurological ROS     GI/Hepatic negative GI ROS, Neg liver ROS,   Endo/Other  Hypothyroidism   Renal/GU negative Renal ROS  negative genitourinary   Musculoskeletal negative musculoskeletal ROS (+)   Abdominal   Peds negative pediatric ROS (+)  Hematology negative hematology ROS (+)   Anesthesia Other Findings Obese, BMI: 35  Reproductive/Obstetrics negative OB ROS                          Anesthesia Physical Anesthesia Plan  ASA: III  Anesthesia Plan: General   Post-op Pain  Management:    Induction: Intravenous  Airway Management Planned: Oral ETT  Additional Equipment:   Intra-op Plan:   Post-operative Plan: Extubation in OR  Informed Consent: I have reviewed the patients History and Physical, chart, labs and discussed the procedure including the risks, benefits and alternatives for the proposed anesthesia with the patient or authorized representative who has indicated his/her understanding and acceptance.   Dental advisory given  Plan Discussed with: CRNA and Surgeon  Anesthesia Plan Comments: (Possible post-induction arterial line discussed with patient)     Anesthesia Quick Evaluation

## 2017-02-15 NOTE — Consult Note (Signed)
Reason for Consult:Chest pain and cholelithiasis  Referring Physician: Court Joy  CC:  Chest pain started in the PM 02/11/18  [name] is an 83 y.o. female.  HPI: Pt presented to the ED at [facility] early AM of 5/19 with chest pain 1-2 hour duration.  She had no abdominal pain.  Pain was better with nitroglycerin. She was admitted by Medicine and work up shows:  She has been afebrile, but her BP is up intermittently. Labs shows T Bil 2.9, AST 414, ALT, 315, Alk Phos 303, K+ 3.0 , WBC 15K on admit.  Trop up to 0.45 on 02/13/17.  Bilirubin up to 4.8 on 5/20, but coming down now along with other LFT's.  WBC is bac to normal also.  H/H is stable. Radiology:   RUQ [place] 02/12/17 : No gallstones noted, wall mildly thickened. No Pericholecystic fluid.  CBD 4.30m. CT chest 5/19:  No thoracic aortic aneurysm or dissection. No pulmonary emboli Identified.  No acute abnormalities in the chest. Cholelithiasis. Gallbladder wall thickening anteriorly is more prominent prominent in the interval. Acute cholecystitis is not excluded on this study. CT abd 5/19:  Cholelithiasis. Gallbladder wall thickening anteriorly is more prominent prominent in the interval. Acute cholecystitis is not excluded on this study. MRI 5/21:  Cholelithiasis. Although there is some mild gallbladder wall thickening, the gallbladder is under distended, and this is likely a contributing factor. There is no pericholecystic fluid or other surrounding inflammatory changes to suggest an acute cholecystitis at this time.   No choledocholithiasis or evidence of biliary tract obstruction.  Cardiac CATH 02/14/17: 1. Mild, nonobstructive coronary artery disease with very large, tortuous coronary arteries. No culprit lesion identified for the patient's elevated troponin. 2. Calcified aortic valve. Aortic valve was not crossed due to significant tortuosity of the right brachiocephalic and subclavian arteries as well as known aortic  stenosis.  We are ask to see.  She had a similar episode in Dec last year.    Past Medical History:  Diagnosis Date  . Anxiety   . Arthritis   . Blood transfusion   . Chronic bronchitis   . Constipation   . Depression   . Hyperlipidemia   . Hypothyroidism     Past Surgical History:  Procedure Laterality Date  . CORONARY ANGIOGRAPHY N/A 02/14/2017   Procedure: Coronary Angiography;  Surgeon: ENelva Bush MD;  Location: [facility];  Service: Cardiovascular;  Laterality: N/A;  . EYE SURGERY  2009   bilateral  . HEMATOMA EVACUATION  06/04/2011   Procedure: EVACUATION HEMATOMA;  Surgeon: SArther Abbott MD;  Location: [facility];  Service: Orthopedics;  Laterality: Right;  evacuation of seroma  . JOINT REPLACEMENT  05/2010   left total knee Dr. HAline Brochure . [facility]    . Left total knee    . PATELLAR TENDON REPAIR  06/04/2011   Procedure: PATELLA TENDON REPAIR;  Surgeon: SArther Abbott MD;  Location: [facility];  Service: Orthopedics;  Laterality: Right;  . PATELLAR TENDON REPAIR  08/30/2011   Procedure: PATELLA TENDON REPAIR;  Surgeon: SArther Abbott MD;  Location: [facility];  Service: Orthopedics;  Laterality: Right;  Allograft Reconstrucation Right Patella Tendon  . REPLACEMENT TOTAL KNEE BILATERAL    . TOTAL HIP ARTHROPLASTY    . TOTAL KNEE ARTHROPLASTY  05/03/2011   Procedure: TOTAL KNEE ARTHROPLASTY;  Surgeon: SArther Abbott MD;  Location: [facility];  Service: Orthopedics;  Laterality: Right;  With DePuy  . TOTAL THYROIDECTOMY  March 2011   Dr. Benjamine Mola @ [facility]  . TUBAL LIGATION      Family History  Problem Relation Age of Onset  . Chen cancer Mother   . Blindness Sister   . Anesthesia problems Neg Hx   . Hypotension Neg Hx   . Malignant hyperthermia Neg Hx   . Pseudochol deficiency Neg Hx     Social History:  reports that she has never smoked. She has never used smokeless tobacco. She reports that she does not drink alcohol or use  drugs.  Allergies: No Known Allergies  Medications:  Prior to Admission:  Prescriptions Prior to Admission  Medication Sig Dispense Refill Last Dose  . ALPRAZolam (XANAX) 0.25 MG tablet Take 1 tablet by mouth 4 (four) times daily.  4 02/11/2017 at Unknown time  . aspirin EC 81 MG tablet Take 81 mg by mouth daily.   02/11/2017 at Unknown time  . furosemide (LASIX) 40 MG tablet Take 40 mg by mouth.   Past Week at Unknown time  . ibuprofen (ADVIL,MOTRIN) 200 MG tablet Take 600 mg by mouth daily as needed for moderate pain.   02/11/2017 at Unknown time  . levothyroxine (SYNTHROID, LEVOTHROID) 150 MCG tablet Take 150 mcg by mouth every morning.     02/11/2017 at Unknown time  . omeprazole (PRILOSEC) 20 MG capsule Take 20 mg by mouth daily.   02/11/2017 at Unknown time  . PARoxetine (PAXIL) 20 MG tablet Take 20 mg by mouth every evening.     02/11/2017 at Unknown time  . tolterodine (DETROL LA) 4 MG 24 hr capsule Take 4 mg by mouth daily.  10 02/11/2017 at Unknown time   Scheduled: . aspirin  81 mg Oral Daily  . darifenacin  15 mg Oral Daily  . enoxaparin (LOVENOX) injection  40 mg Subcutaneous Q24H  . levothyroxine  150 mcg Oral QAC breakfast  . PARoxetine  20 mg Oral QPM  . sodium chloride flush  3 mL Intravenous Q12H   Continuous: . sodium chloride    . famotidine (PEPCID) IV 20 mg (02/15/17 0827)  . piperacillin-tazobactam Stopped (02/15/17 0540)   Anti-infectives    Start     Dose/Rate Route Frequency Ordered Stop   02/13/17 1400  piperacillin-tazobactam (ZOSYN) IVPB 3.375 g     3.375 g 12.5 mL/hr over 240 Minutes Intravenous Every 8 hours 02/13/17 1055     02/12/17 0600  piperacillin-tazobactam (ZOSYN) IVPB 3.375 g  Status:  Discontinued     3.375 g 100 mL/hr over 30 Minutes Intravenous Every 8 hours 02/12/17 0404 02/12/17 0422   02/12/17 0430  piperacillin-tazobactam (ZOSYN) IVPB 3.375 g     3.375 g 12.5 mL/hr over 240 Minutes Intravenous  Once 02/12/17 0422 02/12/17 0902    02/12/17 0315  cefTRIAXone (ROCEPHIN) 2 g in dextrose 5 % 50 mL IVPB     2 g 100 mL/hr over 30 Minutes Intravenous  Once 02/12/17 0303 02/12/17 0343      Results for orders placed or performed during the hospital encounter of 02/12/17 (from the past 48 hour(s))  Comprehensive metabolic panel     Status: Abnormal   Collection Time: 02/13/17 10:07 AM  Result Value Ref Range   Sodium 141 135 - 145 mmol/L   Potassium 2.9 (L) 3.5 - 5.1 mmol/L   Chloride 105 101 - 111 mmol/L   CO2 29 22 - 32 mmol/L   Glucose, Bld 139 (H) 65 - 99 mg/dL   BUN 10 6 - 20 mg/dL  Creatinine, Ser 0.60 0.44 - 1.00 mg/dL   Calcium 8.4 (L) 8.9 - 10.3 mg/dL   Total Protein 6.3 (L) 6.5 - 8.1 g/dL   Albumin 3.1 (L) 3.5 - 5.0 g/dL   AST 191 (H) 15 - 41 U/L   ALT 240 (H) 14 - 54 U/L   Alkaline Phosphatase 242 (H) 38 - 126 U/L   Total Bilirubin 4.8 (H) 0.3 - 1.2 mg/dL   GFR calc non Af Amer >60 >60 mL/min   GFR calc Af Amer >60 >60 mL/min    Comment: (NOTE) The eGFR has been calculated using the CKD EPI equation. This calculation has not been validated in all clinical situations. eGFR's persistently <60 mL/min signify possible Chronic Kidney Disease.    Anion gap 7 5 - 15  CBC with Differential/Platelet     Status: Abnormal   Collection Time: 02/13/17 10:07 AM  Result Value Ref Range   WBC 13.7 (H) 4.0 - 10.5 K/uL   RBC 4.62 3.87 - 5.11 MIL/uL   Hemoglobin 13.3 12.0 - 15.0 g/dL   HCT 39.9 36.0 - 46.0 %   MCV 86.4 78.0 - 100.0 fL   MCH 28.8 26.0 - 34.0 pg   MCHC 33.3 30.0 - 36.0 g/dL   RDW 14.8 11.5 - 15.5 %   Platelets 221 150 - 400 K/uL   Neutrophils Relative % 89 %   Neutro Abs 12.1 (H) 1.7 - 7.7 K/uL   Lymphocytes Relative 4 %   Lymphs Abs 0.6 (L) 0.7 - 4.0 K/uL   Monocytes Relative 7 %   Monocytes Absolute 0.9 0.1 - 1.0 K/uL   Eosinophils Relative 0 %   Eosinophils Absolute 0.0 0.0 - 0.7 K/uL   Basophils Relative 0 %   Basophils Absolute 0.0 0.0 - 0.1 K/uL  Troponin I (q 6hr x 3)     Status:  Abnormal   Collection Time: 02/13/17 10:07 AM  Result Value Ref Range   Troponin I 0.36 (HH) <0.03 ng/mL    Comment: CRITICAL VALUE NOTED.  VALUE IS CONSISTENT WITH PREVIOUSLY REPORTED AND CALLED VALUE.  Troponin I (q 6hr x 3)     Status: Abnormal   Collection Time: 02/13/17  3:29 PM  Result Value Ref Range   Troponin I 0.43 (HH) <0.03 ng/mL    Comment: CRITICAL VALUE NOTED.  VALUE IS CONSISTENT WITH PREVIOUSLY REPORTED AND CALLED VALUE.  Troponin I (q 6hr x 3)     Status: Abnormal   Collection Time: 02/13/17  9:56 PM  Result Value Ref Range   Troponin I 0.45 (HH) <0.03 ng/mL    Comment: CRITICAL VALUE NOTED.  VALUE IS CONSISTENT WITH PREVIOUSLY REPORTED AND CALLED VALUE.  Comprehensive metabolic panel     Status: Abnormal   Collection Time: 02/14/17  4:21 AM  Result Value Ref Range   Sodium 139 135 - 145 mmol/L   Potassium 4.1 3.5 - 5.1 mmol/L    Comment: DELTA CHECK NOTED   Chloride 105 101 - 111 mmol/L   CO2 29 22 - 32 mmol/L   Glucose, Bld 90 65 - 99 mg/dL   BUN 9 6 - 20 mg/dL   Creatinine, Ser 0.65 0.44 - 1.00 mg/dL   Calcium 8.3 (L) 8.9 - 10.3 mg/dL   Total Protein 5.9 (L) 6.5 - 8.1 g/dL   Albumin 2.9 (L) 3.5 - 5.0 g/dL   AST 118 (H) 15 - 41 U/L   ALT 188 (H) 14 - 54 U/L   Alkaline Phosphatase 215 (  H) 38 - 126 U/L   Total Bilirubin 2.1 (H) 0.3 - 1.2 mg/dL   GFR calc non Af Amer >60 >60 mL/min   GFR calc Af Amer >60 >60 mL/min    Comment: (NOTE) The eGFR has been calculated using the CKD EPI equation. This calculation has not been validated in all clinical situations. eGFR's persistently <60 mL/min signify possible Chronic Kidney Disease.    Anion gap 5 5 - 15  CBC with Differential/Platelet     Status: None   Collection Time: 02/14/17  4:21 AM  Result Value Ref Range   WBC 9.4 4.0 - 10.5 K/uL   RBC 4.46 3.87 - 5.11 MIL/uL   Hemoglobin 12.5 12.0 - 15.0 g/dL   HCT 38.8 36.0 - 46.0 %   MCV 87.0 78.0 - 100.0 fL   MCH 28.0 26.0 - 34.0 pg   MCHC 32.2 30.0 - 36.0  g/dL   RDW 15.1 11.5 - 15.5 %   Platelets 234 150 - 400 K/uL   Neutrophils Relative % 75 %   Neutro Abs 7.0 1.7 - 7.7 K/uL   Lymphocytes Relative 14 %   Lymphs Abs 1.3 0.7 - 4.0 K/uL   Monocytes Relative 8 %   Monocytes Absolute 0.8 0.1 - 1.0 K/uL   Eosinophils Relative 3 %   Eosinophils Absolute 0.2 0.0 - 0.7 K/uL   Basophils Relative 0 %   Basophils Absolute 0.0 0.0 - 0.1 K/uL  Comprehensive metabolic panel     Status: Abnormal   Collection Time: 02/15/17  5:35 AM  Result Value Ref Range   Sodium 137 135 - 145 mmol/L   Potassium 3.5 3.5 - 5.1 mmol/L   Chloride 102 101 - 111 mmol/L   CO2 26 22 - 32 mmol/L   Glucose, Bld 110 (H) 65 - 99 mg/dL   BUN 12 6 - 20 mg/dL   Creatinine, Ser 0.75 0.44 - 1.00 mg/dL   Calcium 8.4 (L) 8.9 - 10.3 mg/dL   Total Protein 5.4 (L) 6.5 - 8.1 g/dL   Albumin 2.6 (L) 3.5 - 5.0 g/dL   AST 41 15 - 41 U/L   ALT 116 (H) 14 - 54 U/L   Alkaline Phosphatase 201 (H) 38 - 126 U/L   Total Bilirubin 1.5 (H) 0.3 - 1.2 mg/dL   GFR calc non Af Amer >60 >60 mL/min   GFR calc Af Amer >60 >60 mL/min    Comment: (NOTE) The eGFR has been calculated using the CKD EPI equation. This calculation has not been validated in all clinical situations. eGFR's persistently <60 mL/min signify possible Chronic Kidney Disease.    Anion gap 9 5 - 15  CBC with Differential/Platelet     Status: None   Collection Time: 02/15/17  5:35 AM  Result Value Ref Range   WBC 9.2 4.0 - 10.5 K/uL   RBC 4.50 3.87 - 5.11 MIL/uL   Hemoglobin 12.5 12.0 - 15.0 g/dL   HCT 38.8 36.0 - 46.0 %   MCV 86.2 78.0 - 100.0 fL   MCH 27.8 26.0 - 34.0 pg   MCHC 32.2 30.0 - 36.0 g/dL   RDW 14.8 11.5 - 15.5 %   Platelets 247 150 - 400 K/uL   Neutrophils Relative % 68 %   Neutro Abs 6.2 1.7 - 7.7 K/uL   Lymphocytes Relative 20 %   Lymphs Abs 1.9 0.7 - 4.0 K/uL   Monocytes Relative 8 %   Monocytes Absolute 0.8 0.1 - 1.0 K/uL  Eosinophils Relative 3 %   Eosinophils Absolute 0.3 0.0 - 0.7 K/uL    Basophils Relative 1 %   Basophils Absolute 0.1 0.0 - 0.1 K/uL    Mr 3d Recon At Scanner  Result Date: 02/14/2017 CLINICAL DATA:  83 year old female with history of cholelithiasis. Evaluate for acute cholecystitis. EXAM: MRI ABDOMEN WITHOUT AND WITH CONTRAST (INCLUDING MRCP) TECHNIQUE: Multiplanar multisequence MR imaging of the abdomen was performed both before and after the administration of intravenous contrast. Heavily T2-weighted images of the biliary and pancreatic ducts were obtained, and three-dimensional MRCP images were rendered by post processing. CONTRAST:  94m MULTIHANCE GADOBENATE DIMEGLUMINE 529 MG/ML IV SOLN COMPARISON:  None. FINDINGS: Comment: Study is mildly limited by considerable patient respiratory motion. Lower chest: Unremarkable. Hepatobiliary: No discrete cystic or solid hepatic lesions. No intra or extrahepatic biliary ductal dilatation. Small filling defects in the gallbladder, compatible with a gallstone. MRCP images are considerably limited by patient respiratory motion. With these limitations in mind, there are no additional filling defects noted in the common bile duct to suggest choledocholithiasis. Common bile duct is normal in caliber measuring 6 mm in the porta hepatis. No intrahepatic biliary ductal dilatation. Gallbladder wall appears mildly thickened measuring up to 5 mm. However, the gallbladder is under distended. No pericholecystic fluid. Pancreas: No pancreatic mass. No pancreatic ductal dilatation appreciated on today's limited MRCP images. No pancreatic or peripancreatic fluid or inflammatory changes. Spleen:  Unremarkable. Adrenals/Urinary Tract: Bilateral kidneys and bilateral adrenal glands are normal in appearance. There is no hydroureteronephrosis in the visualized portions of the abdomen. Stomach/Bowel: Visualized portions are unremarkable. Vascular/Lymphatic: Aortic atherosclerosis, without evidence of aneurysm in the visualized abdominal vasculature. No  lymphadenopathy noted in the abdomen. Other: No significant volume of ascites noted in the visualized peritoneal cavity. Musculoskeletal: No aggressive osseous lesions are noted in the visualized portions of the skeleton. S-shaped thoracolumbar scoliosis convex to the right superiorly and to the left inferiorly. IMPRESSION: 1. Cholelithiasis. Although there is some mild gallbladder wall thickening, the gallbladder is under distended, and this is likely a contributing factor. There is no pericholecystic fluid or other surrounding inflammatory changes to suggest an acute cholecystitis at this time. 2. No choledocholithiasis or evidence of biliary tract obstruction. Electronically Signed   By: DVinnie LangtonM.D.   On: 02/14/2017 08:50   Mr Abdomen Mrcp WMoise BoringContast  Result Date: 02/14/2017 CLINICAL DATA:  83year old female with history of cholelithiasis. Evaluate for acute cholecystitis. EXAM: MRI ABDOMEN WITHOUT AND WITH CONTRAST (INCLUDING MRCP) TECHNIQUE: Multiplanar multisequence MR imaging of the abdomen was performed both before and after the administration of intravenous contrast. Heavily T2-weighted images of the biliary and pancreatic ducts were obtained, and three-dimensional MRCP images were rendered by post processing. CONTRAST:  219mMULTIHANCE GADOBENATE DIMEGLUMINE 529 MG/ML IV SOLN COMPARISON:  None. FINDINGS: Comment: Study is mildly limited by considerable patient respiratory motion. Lower chest: Unremarkable. Hepatobiliary: No discrete cystic or solid hepatic lesions. No intra or extrahepatic biliary ductal dilatation. Small filling defects in the gallbladder, compatible with a gallstone. MRCP images are considerably limited by patient respiratory motion. With these limitations in mind, there are no additional filling defects noted in the common bile duct to suggest choledocholithiasis. Common bile duct is normal in caliber measuring 6 mm in the porta hepatis. No intrahepatic biliary ductal  dilatation. Gallbladder wall appears mildly thickened measuring up to 5 mm. However, the gallbladder is under distended. No pericholecystic fluid. Pancreas: No pancreatic mass. No pancreatic ductal dilatation appreciated on today's  limited MRCP images. No pancreatic or peripancreatic fluid or inflammatory changes. Spleen:  Unremarkable. Adrenals/Urinary Tract: Bilateral kidneys and bilateral adrenal glands are normal in appearance. There is no hydroureteronephrosis in the visualized portions of the abdomen. Stomach/Bowel: Visualized portions are unremarkable. Vascular/Lymphatic: Aortic atherosclerosis, without evidence of aneurysm in the visualized abdominal vasculature. No lymphadenopathy noted in the abdomen. Other: No significant volume of ascites noted in the visualized peritoneal cavity. Musculoskeletal: No aggressive osseous lesions are noted in the visualized portions of the skeleton. S-shaped thoracolumbar scoliosis convex to the right superiorly and to the left inferiorly. IMPRESSION: 1. Cholelithiasis. Although there is some mild gallbladder wall thickening, the gallbladder is under distended, and this is likely a contributing factor. There is no pericholecystic fluid or other surrounding inflammatory changes to suggest an acute cholecystitis at this time. 2. No choledocholithiasis or evidence of biliary tract obstruction. Electronically Signed   By: Vinnie Langton M.D.   On: 02/14/2017 08:50    Review of Systems  Constitutional: Negative.   HENT: Negative.   Eyes: Negative.   Respiratory: Positive for shortness of breath (maybe some with exertion.  she is confined to wheel chair so she does stuff from the chair.).   Cardiovascular: Positive for chest pain and leg swelling. Negative for claudication and PND.       She sleeps in a chair for the last 13 years, to be with her husband and now it's just how she does it.    Gastrointestinal: Positive for constipation. Negative for abdominal pain,  blood in stool, diarrhea, heartburn, melena, nausea and vomiting.  Genitourinary:       Chronic incontinence, can't really wear depends.  Musculoskeletal:       She has had surgery on both knees, and she is wheelchair bound.  Does everything from a wheelchair.  She only goes out to the doctor's and to the grocery store.    Skin: Negative.   Neurological: Negative.   Endo/Heme/Allergies: Negative.   Psychiatric/Behavioral: Positive for depression.   Blood pressure (!) 160/73, pulse (!) 54, temperature 98.1 F (36.7 C), temperature source Oral, resp. rate 18, height 5' 11"  (1.803 m), weight 113.9 kg (251 lb 1.6 oz), SpO2 98 %. Physical Exam  Constitutional: She is oriented to person, place, and time. She appears well-developed and well-nourished. No distress.  Mostly hungry now.  No pain or distress.  HENT:  Head: Normocephalic and atraumatic.  Mouth/Throat: Oropharynx is clear and moist. No oropharyngeal exudate.  Eyes: Right eye exhibits no discharge. Left eye exhibits no discharge. No scleral icterus.  Pupils are equal  Neck: Normal range of motion. No JVD present. No tracheal deviation present. No thyromegaly present.  Thyroid scar  Cardiovascular: Normal rate and regular rhythm.   Murmur (aortic and mitral) heard. Respiratory: Effort normal and breath sounds normal. No respiratory distress. She has no wheezes. She has no rales. She exhibits no tenderness.  GI: Soft. Bowel sounds are normal. She exhibits no distension and no mass. There is no tenderness. There is no rebound and no guarding.  Musculoskeletal: Normal range of motion. She exhibits no edema or tenderness.  She has had multiple operations on right and one on left knee, wheel chair bound for some time.  Lymphadenopathy:    She has no cervical adenopathy.  Neurological: She is alert and oriented to person, place, and time. No cranial nerve deficit.  Skin: Skin is warm and dry. No rash noted. She is not diaphoretic. No  erythema. No pallor.  Psychiatric:  She has a normal mood and affect. Her behavior is normal. Judgment and thought content normal.   Anti-infectives    Start     Dose/Rate Route Frequency Ordered Stop   02/13/17 1400  piperacillin-tazobactam (ZOSYN) IVPB 3.375 g     3.375 g 12.5 mL/hr over 240 Minutes Intravenous Every 8 hours 02/13/17 1055     02/12/17 0600  piperacillin-tazobactam (ZOSYN) IVPB 3.375 g  Status:  Discontinued     3.375 g 100 mL/hr over 30 Minutes Intravenous Every 8 hours 02/12/17 0404 02/12/17 0422   02/12/17 0430  piperacillin-tazobactam (ZOSYN) IVPB 3.375 g     3.375 g 12.5 mL/hr over 240 Minutes Intravenous  Once 02/12/17 0422 02/12/17 0902   02/12/17 0315  cefTRIAXone (ROCEPHIN) 2 g in dextrose 5 % 50 mL IVPB     2 g 100 mL/hr over 30 Minutes Intravenous  Once 02/12/17 0303 02/12/17 0343      Assessment/Plan: Cholelithiasis and most likely passed a stone Chest pain with non obstructive CAD Moderate to severe AS Hypertension Remote tobacco use Body mass index is 35 Deconditioning/wheelchair bound Osteoarthritis  Plan:  She does not have  obstructive coronary disease, she has aortic stenosis.  I have talked to Dr. Radford Pax and she will get back to me with a decision on possible surgery today.    I have made her NPO.        [name] 02/15/2017, 9:08 AM

## 2017-02-16 ENCOUNTER — Encounter (HOSPITAL_COMMUNITY): Payer: Self-pay | Admitting: General Surgery

## 2017-02-16 LAB — BASIC METABOLIC PANEL
Anion gap: 5 (ref 5–15)
BUN: 14 mg/dL (ref 6–20)
CALCIUM: 7.8 mg/dL — AB (ref 8.9–10.3)
CO2: 28 mmol/L (ref 22–32)
CREATININE: 0.75 mg/dL (ref 0.44–1.00)
Chloride: 105 mmol/L (ref 101–111)
GFR calc non Af Amer: >60 mL/min (ref >60)
Glucose, Bld: 154 mg/dL — ABNORMAL HIGH (ref 65–99)
Potassium: 3.5 mmol/L (ref 3.5–5.1)
SODIUM: 138 mmol/L (ref 135–145)

## 2017-02-16 LAB — CBC
HCT: 33.8 % — ABNORMAL LOW (ref 36.0–46.0)
HEMOGLOBIN: 10.5 g/dL — AB (ref 12.0–15.0)
MCH: 27.3 pg (ref 26.0–34.0)
MCHC: 31.1 g/dL (ref 30.0–36.0)
MCV: 87.8 fL (ref 78.0–100.0)
PLATELETS: 257 10*3/uL (ref 150–400)
RBC: 3.85 MIL/uL — AB (ref 3.87–5.11)
RDW: 15.1 % (ref 11.5–15.5)
WBC: 9 10*3/uL (ref 4.0–10.5)

## 2017-02-16 MED ORDER — TRAMADOL HCL 50 MG PO TABS: 50.0000 mg | ORAL_TABLET | Freq: Four times a day (QID) | ORAL | Status: DC | PRN

## 2017-02-16 MED ORDER — DARIFENACIN HYDROBROMIDE ER 15 MG PO TB24: 15.0000 mg | ORAL_TABLET | Freq: Every day | ORAL | 1 refills | Status: DC

## 2017-02-16 MED ORDER — FAMOTIDINE 20 MG PO TABS
20.0000 mg | ORAL_TABLET | Freq: Two times a day (BID) | ORAL | Status: DC
  Administered 2017-02-16: 20 mg via ORAL
  Filled 2017-02-16: qty 1

## 2017-02-16 MED ORDER — IBUPROFEN 600 MG PO TABS: 600.0000 mg | ORAL_TABLET | Freq: Four times a day (QID) | ORAL | Status: DC | PRN

## 2017-02-16 NOTE — Discharge Instructions (Signed)
Laparoscopic Cholecystectomy, Care After °This sheet gives you information about how to care for yourself after your procedure. Your health care provider may also give you more specific instructions. If you have problems or questions, contact your health care provider. °What can I expect after the procedure? °After the procedure, it is common to have: °· Pain at your incision sites. You will be given medicines to control this pain. °· Mild nausea or vomiting. °· Bloating and possible shoulder pain from the air-like gas that was used during the procedure. °Follow these instructions at home: °Incision care  ° °· Follow instructions from your health care provider about how to take care of your incisions. Make sure you: °¨ Wash your hands with soap and water before you change your bandage (dressing). If soap and water are not available, use hand sanitizer. °¨ Change your dressing as told by your health care provider. °¨ Leave stitches (sutures), skin glue, or adhesive strips in place. These skin closures may need to be in place for 2 weeks or longer. If adhesive strip edges start to loosen and curl up, you may trim the loose edges. Do not remove adhesive strips completely unless your health care provider tells you to do that. °· Do not take baths, swim, or use a hot tub until your health care provider approves. Ask your health care provider if you can take showers. You may only be allowed to take sponge baths for bathing. °· Check your incision area every day for signs of infection. Check for: °¨ More redness, swelling, or pain. °¨ More fluid or blood. °¨ Warmth. °¨ Pus or a bad smell. °Activity  °· Do not drive or use heavy machinery while taking prescription pain medicine. °· Do not lift anything that is heavier than 10 lb (4.5 kg) until your health care provider approves. °· Do not play contact sports until your health care provider approves. °· Do not drive for 24 hours if you were given a medicine to help you relax  (sedative). °· Rest as needed. Do not return to work or school until your health care provider approves. °General instructions  °· Take over-the-counter and prescription medicines only as told by your health care provider. °· To prevent or treat constipation while you are taking prescription pain medicine, your health care provider may recommend that you: °¨ Drink enough fluid to keep your urine clear or pale yellow. °¨ Take over-the-counter or prescription medicines. °¨ Eat foods that are high in fiber, such as fresh fruits and vegetables, whole grains, and beans. °¨ Limit foods that are high in fat and processed sugars, such as fried and sweet foods. °Contact a health care provider if: °· You develop a rash. °· You have more redness, swelling, or pain around your incisions. °· You have more fluid or blood coming from your incisions. °· Your incisions feel warm to the touch. °· You have pus or a bad smell coming from your incisions. °· You have a fever. °· One or more of your incisions breaks open. °Get help right away if: °· You have trouble breathing. °· You have chest pain. °· You have increasing pain in your shoulders. °· You faint or feel dizzy when you stand. °· You have severe pain in your abdomen. °· You have nausea or vomiting that lasts for more than one day. °· You have leg pain. °This information is not intended to replace advice given to you by your health care provider. Make sure you discuss any   questions you have with your health care provider. °Document Released: 09/13/2005 Document Revised: 04/03/2016 Document Reviewed: 03/01/2016 °Elsevier Interactive Patient Education © 2017 [employer] ° °CCS ______[facility] °LAPAROSCOPIC SURGERY: POST OP INSTRUCTIONS °Always review your discharge instruction sheet given to you by the facility where your surgery was performed. °IF YOU HAVE DISABILITY OR FAMILY LEAVE FORMS, YOU MUST BRING THEM TO THE OFFICE FOR PROCESSING.   °DO NOT GIVE  THEM TO YOUR DOCTOR. ° °1. A prescription for pain medication may be given to you upon discharge.  Take your pain medication as prescribed, if needed.  If narcotic pain medicine is not needed, then you may take acetaminophen (Tylenol) or ibuprofen (Advil) as needed. °2. Take your usually prescribed medications unless otherwise directed. °3. If you need a refill on your pain medication, please contact your pharmacy.  They will contact our office to request authorization. Prescriptions will not be filled after 5pm or on week-ends. °4. You should follow a light diet the first few days after arrival home, such as soup and crackers, etc.  Be sure to include lots of fluids daily. °5. Most patients will experience some swelling and bruising in the area of the incisions.  Ice packs will help.  Swelling and bruising can take several days to resolve.  °6. It is common to experience some constipation if taking pain medication after surgery.  Increasing fluid intake and taking a stool softener (such as Colace) will usually help or prevent this problem from occurring.  A mild laxative (Milk of Magnesia or Miralax) should be taken according to package instructions if there are no bowel movements after 48 hours. °7. Unless discharge instructions indicate otherwise, you may remove your bandages 24-48 hours after surgery, and you may shower at that time.  You may have steri-strips (small skin tapes) in place directly over the incision.  These strips should be left on the skin for 7-10 days.  If your surgeon used skin glue on the incision, you may shower in 24 hours.  The glue will flake off over the next 2-3 weeks.  Any sutures or staples will be removed at the office during your follow-up visit. °8. ACTIVITIES:  You may resume regular (light) daily activities beginning the next day--such as daily self-care, walking, climbing stairs--gradually increasing activities as tolerated.  You may have sexual intercourse when it is  comfortable.  Refrain from any heavy lifting or straining until approved by your doctor. °a. You may drive when you are no longer taking prescription pain medication, you can comfortably wear a seatbelt, and you can safely maneuver your car and apply brakes. °b. RETURN TO WORK:  __________________________________________________________ °9. You should see your doctor in the office for a follow-up appointment approximately 2-3 weeks after your surgery.  Make sure that you call for this appointment within a day or two after you arrive home to insure a convenient appointment time. °10. OTHER INSTRUCTIONS: __________________________________________________________________________________________________________________________ __________________________________________________________________________________________________________________________ °WHEN TO CALL YOUR DOCTOR: °1. Fever over 101.0 °2. Inability to urinate °3. Continued bleeding from incision. °4. Increased pain, redness, or drainage from the incision. °5. Increasing abdominal pain ° °The clinic staff is available to answer your questions during regular business hours.  Please don’t hesitate to call and ask to speak to one of the nurses for clinical concerns.  If you have a medical emergency, go to the nearest emergency room or call 911.  A surgeon from [facility] is always on call at the hospital. °[address], [address], [place], [place]  [address] ? [address] [address], [place], [place]   [address] °[phone] ? [phone] ? FAX [phone] °Web site: [email or web address] ° °

## 2017-02-16 NOTE — Progress Notes (Signed)
1 Day Post-Op    CC:  Chest pain and cholelithiasis   Subjective: She looks good, no significant pain.  Tolerating diet well and feels better.  Objective: Vital signs in last 24 hours: Temp:  [97.8 F (36.6 C)-98.2 F (36.8 C)] 97.9 F (36.6 C) (05/23 0554) Pulse Rate:  [57-88] 58 (05/23 0554) Resp:  [16-21] 18 (05/23 0554) BP: (117-219)/(45-97) 137/58 (05/23 0554) SpO2:  [96 %-100 %] 100 % (05/23 0554) Weight:  [113.5 kg (250 lb 4.8 oz)] 113.5 kg (250 lb 4.8 oz) (05/23 0554) Last BM Date: 02/09/17 (per pt report) 480 PO yesterday Urine 550 Afebrile, VSS No labs   Intake/Output from previous day: 05/22 0701 - 05/23 0700 In: 2848.3 [P.O.:480; I.V.:2268.3; IV Piggyback:100] Out: 550 [Urine:550] Intake/Output this shift: No intake/output data recorded.  General appearance: alert, cooperative and no distress Resp: clear to auscultation bilaterally and anterior, still on O2 GI: soft sore, sites are fine.    Lab Results:   Recent Labs  02/14/17 0421 02/15/17 0535  WBC 9.4 9.2  HGB 12.5 12.5  HCT 38.8 38.8  PLT 234 247    BMET  Recent Labs  02/14/17 0421 02/15/17 0535  NA 139 137  K 4.1 3.5  CL 105 102  CO2 29 26  GLUCOSE 90 110*  BUN 9 12  CREATININE 0.65 0.75  CALCIUM 8.3* 8.4*   PT/INR No results for input(s): LABPROT, INR in the last 72 hours.   Recent Labs Lab 02/12/17 0154 02/12/17 0551 02/13/17 1007 02/14/17 0421 02/15/17 0535  AST 414* 421* 191* 118* 41  ALT 315* 333* 240* 188* 116*  ALKPHOS 303* 304* 242* 215* 201*  BILITOT 2.9* 3.2* 4.8* 2.1* 1.5*  PROT 6.8 6.7 6.3* 5.9* 5.4*  ALBUMIN 3.6 3.5 3.1* 2.9* 2.6*     Lipase     Component Value Date/Time   LIPASE 41 02/12/2017 0154     Medications: . acetaminophen  650 mg Oral Q6H  . aspirin  81 mg Oral Daily  . darifenacin  15 mg Oral Daily  . enoxaparin (LOVENOX) injection  40 mg Subcutaneous Q24H  . famotidine  20 mg Oral BID  . levothyroxine  150 mcg Oral QAC breakfast   . PARoxetine  20 mg Oral QPM  . sodium chloride flush  3 mL Intravenous Q12H   . sodium chloride    . dextrose 5 % and 0.9 % NaCl with KCl 20 mEq/L 100 mL/hr at 02/15/17 2122  . lactated ringers     Anti-infectives    Start     Dose/Rate Route Frequency Ordered Stop   02/15/17 1315  piperacillin-tazobactam (ZOSYN) IVPB 3.375 g     3.375 g 100 mL/hr over 30 Minutes Intravenous To Surgery 02/15/17 1313 02/15/17 1401   02/13/17 1400  piperacillin-tazobactam (ZOSYN) IVPB 3.375 g  Status:  Discontinued     3.375 g 12.5 mL/hr over 240 Minutes Intravenous Every 8 hours 02/13/17 1055 02/15/17 1600   02/12/17 0600  piperacillin-tazobactam (ZOSYN) IVPB 3.375 g  Status:  Discontinued     3.375 g 100 mL/hr over 30 Minutes Intravenous Every 8 hours 02/12/17 0404 02/12/17 0422   02/12/17 0430  piperacillin-tazobactam (ZOSYN) IVPB 3.375 g     3.375 g 12.5 mL/hr over 240 Minutes Intravenous  Once 02/12/17 0422 02/12/17 0902   02/12/17 0315  cefTRIAXone (ROCEPHIN) 2 g in dextrose 5 % 50 mL IVPB     2 g 100 mL/hr over 30 Minutes Intravenous  Once 02/12/17 0303 02/12/17  1610      Assessment/Plan Chronic Cholelithiasis and most likely passed a stone Laparoscopic cholecystectomy, 02/15/17, Dr. Axel Filler Chest pain with non obstructive CAD Moderate to severe AS Hypertension Remote tobacco use Body mass index is 35 Deconditioning/wheelchair bound Osteoarthritis FEN:  IV fluids/Cardiac diet ID: Zosyn 5/18- 02/15/17 DVT:  lovenox     Plan:  From our standpoint she can go home when medically ready.  Outside dressings can come off tomorrow and she can shower.  Steri strips can come off in a week if they don't come off before that with washing. Mobility is an issue for her but the only restriction we have is lifting over 20 pounds which she will not be doing.  I will put follow up and discharge instruction info in the AVS.   She does not need further antibiotics from our standpoint.  For pain she  can have Tylenol, Ibuporfen, and try tramadol.  I order PT to see how she does with transfers.  I don't think she walks even to BR at home.    LOS: 3 days    [name] 02/16/2017 [phone]

## 2017-02-16 NOTE — Progress Notes (Deleted)
Orders state to give Metoprolol 50mg  daily, AND another order states to give metoprolo 50mg  Bid.   Per Dr. Nelson ChimesAmin, ok to give metoprolol 100mg  this AM and then she will review orders to correct these confusing orders.

## 2017-02-16 NOTE — Discharge Summary (Signed)
Discharge Summary    Patient ID: [name],  MRN: [id number], DOB/AGE: Jan 27, 1933 83 y.o.  Admit date: 02/12/2017 Discharge date: 02/16/2017  Primary Care Provider: Kari BaarsHawkins, Edward Primary Cardiologist: Dr Diona BrownerMcDowell  Discharge Diagnoses    Principal Problem:   Chest pain with moderate risk of acute coronary syndrome Active Problems:   Hypothyroidism   Hyperlipidemia   Moderate aortic stenosis   Hypertensive cardiovascular disease   Depression   Anxiety   Hyperbilirubinemia   Gallstones   Hypokalemia   Biliary colic   Elevated liver enzymes   NSTEMI (non-ST elevated myocardial infarction) [facility](HCC)   Allergies No Known Allergies  Diagnostic Studies/Procedures   Cardiac catheterization 02/14/2017   Conclusion: 1. Mild, nonobstructive coronary artery disease with very large, tortuous coronary arteries. No culprit lesion identified for the patient's elevated troponin. 2. Calcified aortic valve. Aortic valve was not crossed due to significant tortuosity of the right brachiocephalic and subclavian arteries as well as known aortic stenosis.  Recommendations: 1. Medical therapy for troponin elevation, which likely represents supply-demand mismatch in the setting of acute illness. 2. If further hemodynamic assessment of the aortic valve is necessary, consider left and right heart catheterization from femoral artery approach in the future.   Diagnostic Diagram        _________________________________________________________________ Echocardiogram 02/13/2017 Study Conclusions  - Left ventricle: The cavity size was normal. Wall thickness was   increased in a pattern of moderate LVH. Systolic function was   vigorous. The estimated ejection fraction was in the range of 65%   to 70%. Wall motion was normal; there were no regional wall   motion abnormalities. Doppler parameters are consistent with   abnormal left ventricular relaxation (grade 1 diastolic  dysfunction). - Aortic valve: Moderately to severely calcified annulus. Severely   calcified leaflets. There was moderate to severe stenosis. Mean   gradient (S): 27 mm Hg. Peak gradient (S): 48 mm Hg. VTI ratio of   LVOT to aortic valve: 0.4. Valve area (VTI): 1.26 cm^2. Valve   area (Vmax): 1.17 cm^2. Valve area (Vmean): 1.15 cm^2. - Mitral valve: Moderately calcified annulus. There was trivial   regurgitation. - Left atrium: The atrium was mildly dilated. - Right atrium: Central venous pressure (est): 3 mm Hg. - Tricuspid valve: There was mild regurgitation. - Pulmonary arteries: PA peak pressure: 50 mm Hg (S). - Pericardium, extracardiac: There was no pericardial effusion.  Impressions:  - Moderate LVH with LVEF 65-70% and grade 1 diastolic dysfunction.   Mild left atrial enlargement. Moderate mitral annular   calcification with trivial mitral regurgitation. Moderate to   severe calcific aortic stenosis as outlined above. Mild tricuspid   regurgitation with PASP 50 mmHg.  _____________   History of Present Illness     83 y.o. female with a history of moderate AS, abdominal atherosclerosis, CP with normal myoview in 08/2016 admitted with epigastric pain with N/V, elevated LFTs, cholelithiasis with GB thickening by CT and elevated trop.     Hospital Course     Consultants: Surgical consult, Dr. Derrell Lollingamirez  Troponins were mildly elevated and likely related to demand ischemic in setting of abdominal process. A cardiac catheterization was done on 5/21/201/ that showed mild nonobstructive CAD (see full report). The patient has moderate aortic stenosis and a review of the echo from 08/2016 and 02/13/2017 the mean AVG is stable at 26- 27mmHg with AVA 1.26-1.41cm2 c/w moderate AS.   [name] symptoms were not likely cardiac related. The patient had elevated  WBCs of 15k, temp of 99.8 and elevated LFT's.  MRI showed cholelithiasis with GB thickening but no distension of the GB and no  evidence of acute cholecystitis and no evidence of biliary obstruction. Once cardiac etiology was ruled out she was taken to the OR for laparoscopic cholecystectomy on 02/15/2017. Care was taken to avoid fluid overload as well as hypotension in the setting of moderate AS.  The patient is doing well today. Surgery states OK for discharge with the following recommendations: Outside dressings can come off tomorrow and she can shower.  Steri strips can come off in a week if they don't come off before that with washing. Mobility is an issue for her but the only restriction we have is lifting over 20 pounds which she will not be doing.   Follow up and discharge instruction info put in the AVS. She does not need further antibiotics from our standpoint.  For pain she can have Tylenol, Ibuporfen, and try tramadol. The patient currently only has mild abdominal tenderness with coughing.   Kidney function is stable with SCr 0.75 today.   Patient has been seen by Dr. Mayford Knife today and deemed ready for discharge home. All follow up appointments have been scheduled. Discharge medications are listed below. _____________  Discharge Vitals Blood pressure (!) 108/35, pulse 64, temperature 97.4 F (36.3 C), temperature source Oral, resp. rate 18, height 5\' 11"  (1.803 m), weight 250 lb 4.8 oz (113.5 kg), SpO2 100 %.  Filed Weights   02/14/17 1424 02/15/17 0702 02/16/17 0554  Weight: 250 lb 7.1 oz (113.6 kg) 251 lb 1.6 oz (113.9 kg) 250 lb 4.8 oz (113.5 kg)    Labs & Radiologic Studies    CBC  Recent Labs  02/14/17 0421 02/15/17 0535 02/16/17 1109  WBC 9.4 9.2 9.0  NEUTROABS 7.0 6.2  --   HGB 12.5 12.5 10.5*  HCT 38.8 38.8 33.8*  MCV 87.0 86.2 87.8  PLT 234 247 257   Basic Metabolic Panel  Recent Labs  02/15/17 0535 02/16/17 1109  NA 137 138  K 3.5 3.5  CL 102 105  CO2 26 28  GLUCOSE 110* 154*  BUN 12 14  CREATININE 0.75 0.75  CALCIUM 8.4* 7.8*   Liver Function Tests  Recent Labs   02/14/17 0421 02/15/17 0535  AST 118* 41  ALT 188* 116*  ALKPHOS 215* 201*  BILITOT 2.1* 1.5*  PROT 5.9* 5.4*  ALBUMIN 2.9* 2.6*   No results for input(s): LIPASE, AMYLASE in the last 72 hours. Cardiac Enzymes  Recent Labs  02/13/17 1529 02/13/17 2156  TROPONINI 0.43* 0.45*   BNP Invalid input(s): POCBNP D-Dimer No results for input(s): DDIMER in the last 72 hours. Hemoglobin A1C No results for input(s): HGBA1C in the last 72 hours. Fasting Lipid Panel No results for input(s): CHOL, HDL, LDLCALC, TRIG, CHOLHDL, LDLDIRECT in the last 72 hours. Thyroid Function Tests No results for input(s): TSH, T4TOTAL, T3FREE, THYROIDAB in the last 72 hours.  Invalid input(s): FREET3 _____________  Dg Chest 2 View  Result Date: 02/12/2017 CLINICAL DATA:  Central chest pain and shortness of breath. EXAM: CHEST  2 VIEW COMPARISON:  Radiograph 08/30/2016 FINDINGS: Lower lung volumes from prior exam leading to bronchovascular crowding. Unchanged cardiomegaly and mediastinal contours. Question of vascular congestion. No consolidation, pleural fluid or pneumothorax. Chronic degenerative change of both shoulders. IMPRESSION: Cardiomegaly. Low lung volumes with bronchovascular crowding, difficult to exclude vascular congestion. Electronically Signed   By: Rubye Oaks M.D.   On: 02/12/2017  02:58   Ct Angio Chest Pe W Or Wo Contrast  Result Date: 02/12/2017 CLINICAL DATA:  Patient complains of chest pain beginning last night. EXAM: CT ANGIOGRAPHY CHEST CT ABDOMEN WITH CONTRAST TECHNIQUE: Multidetector CT imaging of the chest was performed using the standard protocol during bolus administration of intravenous contrast. Multiplanar CT image reconstructions and MIPs were obtained to evaluate the vascular anatomy. Multidetector CT imaging of the abdomen and pelvis was performed using the standard protocol during bolus administration of intravenous contrast. CONTRAST:  30mL ISOVUE-300 IOPAMIDOL  (ISOVUE-300) INJECTION 61% COMPARISON:  Compared to CTA of the chest, abdomen, and pelvis August 30, 2016 FINDINGS: CTA CHEST FINDINGS Cardiovascular: Cardiomegaly identified. Coronary artery calcifications are noted. Atherosclerotic changes associated with the thoracic aorta. No aneurysm or dissection. No pulmonary emboli. Mediastinum/Nodes: There is a moderate to large hiatal hernia. Visualized esophagus otherwise normal. No adenopathy. No effusion. Lungs/Pleura: Central airways are normal. No pneumothorax. A nodule in the lateral right lung base on series 9, image 56 is stable. Atelectasis adjacent to the left hemidiaphragm. No suspicious nodules, masses, or infiltrates otherwise identified. Musculoskeletal: See below Review of the MIP images confirms the above findings. CT ABDOMEN FINDINGS Hepatobiliary: There is a stone in the gallbladder. There is anterior wall thickening associated with the gallbladder measuring up to 6 mm, more prominent in the interval. No adjacent fat stranding. No pericholecystic fluid identified. No focal liver lesions. The portal vein is patent. Pancreas: Unremarkable. No pancreatic ductal dilatation or surrounding inflammatory changes. Spleen: Normal in size without focal abnormality. Adrenals/Urinary Tract: A small nodule in the right adrenal gland is likely an adenoma and is unchanged in the interval. It is too small to characterize however. The kidneys are normal in appearance as are the proximal ureters. Stomach/Bowel: There is a large hiatal hernia. Visualized stomach, small bowel, and colon are otherwise normal. Vascular/Lymphatic: Atherosclerotic changes seen in the tortuous non aneurysmal aorta. No adenopathy. Other: No abdominal wall hernia or abnormality. No abdominopelvic ascites. Musculoskeletal: No acute or significant osseous findings. Review of the MIP images confirms the above findings. IMPRESSION: 1. No thoracic aortic aneurysm or dissection. No pulmonary emboli  identified. 2. No acute abnormalities in the chest. 3. Cholelithiasis. Gallbladder wall thickening anteriorly is more prominent prominent in the interval. Acute cholecystitis is not excluded on this study. An ultrasound could further evaluate if clinically warranted. Electronically Signed   By: Gerome Sam III M.D   On: 02/12/2017 17:41   Ct Abdomen W Contrast  Result Date: 02/12/2017 CLINICAL DATA:  Patient complains of chest pain beginning last night. EXAM: CT ANGIOGRAPHY CHEST CT ABDOMEN WITH CONTRAST TECHNIQUE: Multidetector CT imaging of the chest was performed using the standard protocol during bolus administration of intravenous contrast. Multiplanar CT image reconstructions and MIPs were obtained to evaluate the vascular anatomy. Multidetector CT imaging of the abdomen and pelvis was performed using the standard protocol during bolus administration of intravenous contrast. CONTRAST:  30mL ISOVUE-300 IOPAMIDOL (ISOVUE-300) INJECTION 61% COMPARISON:  Compared to CTA of the chest, abdomen, and pelvis August 30, 2016 FINDINGS: CTA CHEST FINDINGS Cardiovascular: Cardiomegaly identified. Coronary artery calcifications are noted. Atherosclerotic changes associated with the thoracic aorta. No aneurysm or dissection. No pulmonary emboli. Mediastinum/Nodes: There is a moderate to large hiatal hernia. Visualized esophagus otherwise normal. No adenopathy. No effusion. Lungs/Pleura: Central airways are normal. No pneumothorax. A nodule in the lateral right lung base on series 9, image 56 is stable. Atelectasis adjacent to the left hemidiaphragm. No suspicious nodules, masses, or infiltrates  otherwise identified. Musculoskeletal: See below Review of the MIP images confirms the above findings. CT ABDOMEN FINDINGS Hepatobiliary: There is a stone in the gallbladder. There is anterior wall thickening associated with the gallbladder measuring up to 6 mm, more prominent in the interval. No adjacent fat stranding. No  pericholecystic fluid identified. No focal liver lesions. The portal vein is patent. Pancreas: Unremarkable. No pancreatic ductal dilatation or surrounding inflammatory changes. Spleen: Normal in size without focal abnormality. Adrenals/Urinary Tract: A small nodule in the right adrenal gland is likely an adenoma and is unchanged in the interval. It is too small to characterize however. The kidneys are normal in appearance as are the proximal ureters. Stomach/Bowel: There is a large hiatal hernia. Visualized stomach, small bowel, and colon are otherwise normal. Vascular/Lymphatic: Atherosclerotic changes seen in the tortuous non aneurysmal aorta. No adenopathy. Other: No abdominal wall hernia or abnormality. No abdominopelvic ascites. Musculoskeletal: No acute or significant osseous findings. Review of the MIP images confirms the above findings. IMPRESSION: 1. No thoracic aortic aneurysm or dissection. No pulmonary emboli identified. 2. No acute abnormalities in the chest. 3. Cholelithiasis. Gallbladder wall thickening anteriorly is more prominent prominent in the interval. Acute cholecystitis is not excluded on this study. An ultrasound could further evaluate if clinically warranted. Electronically Signed   By: Gerome Sam III M.D   On: 02/12/2017 17:41   Mr 3d Recon At Scanner  Result Date: 02/14/2017 CLINICAL DATA:  83 year old female with history of cholelithiasis. Evaluate for acute cholecystitis. EXAM: MRI ABDOMEN WITHOUT AND WITH CONTRAST (INCLUDING MRCP) TECHNIQUE: Multiplanar multisequence MR imaging of the abdomen was performed both before and after the administration of intravenous contrast. Heavily T2-weighted images of the biliary and pancreatic ducts were obtained, and three-dimensional MRCP images were rendered by post processing. CONTRAST:  20mL MULTIHANCE GADOBENATE DIMEGLUMINE 529 MG/ML IV SOLN COMPARISON:  None. FINDINGS: Comment: Study is mildly limited by considerable patient respiratory  motion. Lower chest: Unremarkable. Hepatobiliary: No discrete cystic or solid hepatic lesions. No intra or extrahepatic biliary ductal dilatation. Small filling defects in the gallbladder, compatible with a gallstone. MRCP images are considerably limited by patient respiratory motion. With these limitations in mind, there are no additional filling defects noted in the common bile duct to suggest choledocholithiasis. Common bile duct is normal in caliber measuring 6 mm in the porta hepatis. No intrahepatic biliary ductal dilatation. Gallbladder wall appears mildly thickened measuring up to 5 mm. However, the gallbladder is under distended. No pericholecystic fluid. Pancreas: No pancreatic mass. No pancreatic ductal dilatation appreciated on today's limited MRCP images. No pancreatic or peripancreatic fluid or inflammatory changes. Spleen:  Unremarkable. Adrenals/Urinary Tract: Bilateral kidneys and bilateral adrenal glands are normal in appearance. There is no hydroureteronephrosis in the visualized portions of the abdomen. Stomach/Bowel: Visualized portions are unremarkable. Vascular/Lymphatic: Aortic atherosclerosis, without evidence of aneurysm in the visualized abdominal vasculature. No lymphadenopathy noted in the abdomen. Other: No significant volume of ascites noted in the visualized peritoneal cavity. Musculoskeletal: No aggressive osseous lesions are noted in the visualized portions of the skeleton. S-shaped thoracolumbar scoliosis convex to the right superiorly and to the left inferiorly. IMPRESSION: 1. Cholelithiasis. Although there is some mild gallbladder wall thickening, the gallbladder is under distended, and this is likely a contributing factor. There is no pericholecystic fluid or other surrounding inflammatory changes to suggest an acute cholecystitis at this time. 2. No choledocholithiasis or evidence of biliary tract obstruction. Electronically Signed   By: Trudie Reed M.D.   On: 02/14/2017  08:50   Mr Abdomen Mrcp Vivien Rossetti Contast  Result Date: 02/14/2017 CLINICAL DATA:  83 year old female with history of cholelithiasis. Evaluate for acute cholecystitis. EXAM: MRI ABDOMEN WITHOUT AND WITH CONTRAST (INCLUDING MRCP) TECHNIQUE: Multiplanar multisequence MR imaging of the abdomen was performed both before and after the administration of intravenous contrast. Heavily T2-weighted images of the biliary and pancreatic ducts were obtained, and three-dimensional MRCP images were rendered by post processing. CONTRAST:  20mL MULTIHANCE GADOBENATE DIMEGLUMINE 529 MG/ML IV SOLN COMPARISON:  None. FINDINGS: Comment: Study is mildly limited by considerable patient respiratory motion. Lower chest: Unremarkable. Hepatobiliary: No discrete cystic or solid hepatic lesions. No intra or extrahepatic biliary ductal dilatation. Small filling defects in the gallbladder, compatible with a gallstone. MRCP images are considerably limited by patient respiratory motion. With these limitations in mind, there are no additional filling defects noted in the common bile duct to suggest choledocholithiasis. Common bile duct is normal in caliber measuring 6 mm in the porta hepatis. No intrahepatic biliary ductal dilatation. Gallbladder wall appears mildly thickened measuring up to 5 mm. However, the gallbladder is under distended. No pericholecystic fluid. Pancreas: No pancreatic mass. No pancreatic ductal dilatation appreciated on today's limited MRCP images. No pancreatic or peripancreatic fluid or inflammatory changes. Spleen:  Unremarkable. Adrenals/Urinary Tract: Bilateral kidneys and bilateral adrenal glands are normal in appearance. There is no hydroureteronephrosis in the visualized portions of the abdomen. Stomach/Bowel: Visualized portions are unremarkable. Vascular/Lymphatic: Aortic atherosclerosis, without evidence of aneurysm in the visualized abdominal vasculature. No lymphadenopathy noted in the abdomen. Other: No  significant volume of ascites noted in the visualized peritoneal cavity. Musculoskeletal: No aggressive osseous lesions are noted in the visualized portions of the skeleton. S-shaped thoracolumbar scoliosis convex to the right superiorly and to the left inferiorly. IMPRESSION: 1. Cholelithiasis. Although there is some mild gallbladder wall thickening, the gallbladder is under distended, and this is likely a contributing factor. There is no pericholecystic fluid or other surrounding inflammatory changes to suggest an acute cholecystitis at this time. 2. No choledocholithiasis or evidence of biliary tract obstruction. Electronically Signed   By: Trudie Reed M.D.   On: 02/14/2017 08:50   US Abdomen Limited Ruq  Result Date: 02/12/2017 CLINICAL DATA:  Hyperbilirubinemia.  Nausea and vomiting. EXAM: US ABDOMEN LIMITED - RIGHT UPPER QUADRANT COMPARISON:  None. FINDINGS: Gallbladder: No gallstones. No sludge. Wall appears mildly thickened where it abuts the liver, measuring 5 mm. No pericholecystic fluid. Common bile duct: Diameter: 4.7 mm, not well visualized. Liver: Liver poorly visualized. Increased parenchymal echogenicity. No liver mass or focal lesion. IMPRESSION: 1. Exam limited by the patient's body habitus. 2. No convincing acute abnormality. No gallstones. No bile duct dilation. 3. Increased echogenicity of the liver consistent with fatty infiltration. Electronically Signed   By: Amie Portland M.D.   On: 02/12/2017 11:35   Disposition   Pt is being discharged home today in good condition.  Follow-up Plans & Appointments    Follow-up Information    [facility], [facility] Follow up on 03/10/2017.   Specialty:  General Surgery Why:  Your appointment is at 1:30 PM.   Be at the office 40 minutes early for check in.  Bring photo ID and insurance information.   Contact information: [address] [place] [address] [phone]        Jodelle Gross, NP Follow up on  03/10/2017.   Specialties:  Nurse Practitioner, Radiology, Cardiology Why:  at 1:30 for cardiology hospital follow up.  Contact  information: [address] [place] [address] [phone]          Discharge Instructions    Diet - low sodium heart healthy    Complete by:  As directed    Increase activity slowly    Complete by:  As directed       Discharge Medications   Current Discharge Medication List    CONTINUE these medications which have NOT CHANGED   Details  ALPRAZolam (XANAX) 0.25 MG tablet Take 1 tablet by mouth 4 (four) times daily. Refills: 4    aspirin EC 81 MG tablet Take 81 mg by mouth daily.    furosemide (LASIX) 40 MG tablet Take 40 mg by mouth.    ibuprofen (ADVIL,MOTRIN) 200 MG tablet Take 600 mg by mouth daily as needed for moderate pain.    levothyroxine (SYNTHROID, LEVOTHROID) 150 MCG tablet Take 150 mcg by mouth every morning.      omeprazole (PRILOSEC) 20 MG capsule Take 20 mg by mouth daily.    PARoxetine (PAXIL) 20 MG tablet Take 20 mg by mouth every evening.      tolterodine (DETROL LA) 4 MG 24 hr capsule Take 4 mg by mouth daily. Refills: 10      STOP taking these medications     solifenacin (VESICARE) 10 MG tablet          Outstanding Labs/Studies   None  Duration of Discharge Encounter   Greater than 30 minutes including physician time.  Signed, Berton Bon NP 02/16/2017, 12:58 PM

## 2017-02-16 NOTE — Progress Notes (Signed)
Progress Note  Patient Name: [name] Date of Encounter: 02/16/2017  Primary Cardiologist: Dr. Percell Belt  Subjective   No complaints of chest pain or SOB.  Sore in her abdomen post lap chole  Inpatient Medications    Scheduled Meds: . acetaminophen  650 mg Oral Q6H  . aspirin  81 mg Oral Daily  . darifenacin  15 mg Oral Daily  . enoxaparin (LOVENOX) injection  40 mg Subcutaneous Q24H  . famotidine  20 mg Oral BID  . levothyroxine  150 mcg Oral QAC breakfast  . PARoxetine  20 mg Oral QPM  . sodium chloride flush  3 mL Intravenous Q12H   Continuous Infusions: . sodium chloride    . dextrose 5 % and 0.9 % NaCl with KCl 20 mEq/L 100 mL/hr at 02/16/17 0853  . lactated ringers     PRN Meds: sodium chloride, albuterol, HYDROmorphone, ibuprofen, LORazepam, ondansetron (ZOFRAN) IV, oxyCODONE, sodium chloride flush, traMADol   Vital Signs    Vitals:   02/15/17 1647 02/15/17 2031 02/16/17 0055 02/16/17 0554  BP: (!) 151/81 (!) 128/56 (!) 117/52 (!) 137/58  Pulse: (!) 57 (!) 57 60 (!) 58  Resp:  18 17 18   Temp: 97.8 F (36.6 C) 97.9 F (36.6 C) 98 F (36.7 C) 97.9 F (36.6 C)  TempSrc: Oral Oral Oral Oral  SpO2: 98% 98% 96% 100%  Weight:    250 lb 4.8 oz (113.5 kg)  Height:        Intake/Output Summary (Last 24 hours) at 02/16/17 1610 Last data filed at 02/16/17 9604  Gross per 24 hour  Intake          2848.33 ml  Output              550 ml  Net          2298.33 ml   Filed Weights   02/14/17 1424 02/15/17 0702 02/16/17 0554  Weight: 250 lb 7.1 oz (113.6 kg) 251 lb 1.6 oz (113.9 kg) 250 lb 4.8 oz (113.5 kg)    Telemetry    NSR - Personally Reviewed  ECG    No new tracings  Physical Exam   GEN: No acute distress.   Neck: No JVD Cardiac: RRR, no  rubs, or gallops. 2/6 SM at RUSB  Respiratory: scattered rhonchi at bases that clear with cough GI: Soft, nontender, non-distended  MS: No edema; No deformity. Neuro:  Nonfocal  Psych: Normal  affect   Labs    Chemistry  Recent Labs Lab 02/13/17 1007 02/14/17 0421 02/15/17 0535  NA 141 139 137  K 2.9* 4.1 3.5  CL 105 105 102  CO2 29 29 26   GLUCOSE 139* 90 110*  BUN 10 9 12   CREATININE 0.60 0.65 0.75  CALCIUM 8.4* 8.3* 8.4*  PROT 6.3* 5.9* 5.4*  ALBUMIN 3.1* 2.9* 2.6*  AST 191* 118* 41  ALT 240* 188* 116*  ALKPHOS 242* 215* 201*  BILITOT 4.8* 2.1* 1.5*  GFRNONAA >60 >60 >60  GFRAA >60 >60 >60  ANIONGAP 7 5 9      Hematology  Recent Labs Lab 02/13/17 1007 02/14/17 0421 02/15/17 0535  WBC 13.7* 9.4 9.2  RBC 4.62 4.46 4.50  HGB 13.3 12.5 12.5  HCT 39.9 38.8 38.8  MCV 86.4 87.0 86.2  MCH 28.8 28.0 27.8  MCHC 33.3 32.2 32.2  RDW 14.8 15.1 14.8  PLT 221 234 247    Cardiac Enzymes  Recent Labs Lab 02/13/17 0121 02/13/17 1007 02/13/17 1529  02/13/17 2156  TROPONINI 0.26* 0.36* 0.43* 0.45*     Recent Labs Lab 02/12/17 0216  TROPIPOC 0.01     BNP  Recent Labs Lab 02/12/17 0551  BNP 137.0*     DDimer   Recent Labs Lab 02/12/17 1342  DDIMER 4.14*     Radiology    No results found.  Cardiac Studies   Cardiac cath 02/14/2017 Conclusion: 1. Mild, nonobstructive coronary artery disease with very large, tortuous coronary arteries. No culprit lesion identified for the patient's elevated troponin. 2. Calcified aortic valve. Aortic valve was not crossed due to significant tortuosity of the right brachiocephalic and subclavian arteries as well as known aortic stenosis.  Recommendations: 1. Medical therapy for troponin elevation, which likely represents supply-demand mismatch in the setting of acute illness. 2. If further hemodynamic assessment of the aortic valve is necessary, consider left and right heart catheterization from femoral artery approach in the future.  2D echo 02/13/2017 Study Conclusions  - Left ventricle: The cavity size was normal. Wall thickness was   increased in a pattern of moderate LVH. Systolic function  was   vigorous. The estimated ejection fraction was in the range of 65%   to 70%. Wall motion was normal; there were no regional wall   motion abnormalities. Doppler parameters are consistent with   abnormal left ventricular relaxation (grade 1 diastolic   dysfunction). - Aortic valve: Moderately to severely calcified annulus. Severely   calcified leaflets. There was moderate to severe stenosis. Mean   gradient (S): 27 mm Hg. Peak gradient (S): 48 mm Hg. VTI ratio of   LVOT to aortic valve: 0.4. Valve area (VTI): 1.26 cm^2. Valve   area (Vmax): 1.17 cm^2. Valve area (Vmean): 1.15 cm^2. - Mitral valve: Moderately calcified annulus. There was trivial   regurgitation. - Left atrium: The atrium was mildly dilated. - Right atrium: Central venous pressure (est): 3 mm Hg. - Tricuspid valve: There was mild regurgitation. - Pulmonary arteries: PA peak pressure: 50 mm Hg (S). - Pericardium, extracardiac: There was no pericardial effusion.  Impressions:  - Moderate LVH with LVEF 65-70% and grade 1 diastolic dysfunction.   Mild left atrial enlargement. Moderate mitral annular   calcification with trivial mitral regurgitation. Moderate to   severe calcific aortic stenosis as outlined above. Mild tricuspid   regurgitation with PASP 50 mmHg.  ------------------------------------------------------------------- Study data:  Comparison was made to the study of 08/30/2016.  Study status:  Routine.  Procedure:  The patient reported no pain pre or post test. Transthoracic echocardiography. Image quality was adequate.  Study completion:  There were no complications. Transthoracic echocardiography.  M-mode, complete 2D, spectral Doppler, and color Doppler.  Birthdate:  Patient birthdate: 1933-03-07.  Age:  Patient is 83 yr old.  Sex:  Gender: female. BMI: 34 kg/m^2.  Blood pressure:     165/53  Patient status: Inpatient.  Study date:  Study date: 02/13/2017. Study time: 11:10 AM.  Location:   Bedside.  -------------------------------------------------------------------  ------------------------------------------------------------------- Left ventricle:  The cavity size was normal. Wall thickness was increased in a pattern of moderate LVH. Systolic function was vigorous. The estimated ejection fraction was in the range of 65% to 70%. Wall motion was normal; there were no regional wall motion abnormalities. Doppler parameters are consistent with abnormal left ventricular relaxation (grade 1 diastolic dysfunction).  Patient Profile     83 y.o. female with a history of moderate AS, abdominal atherosclerosis, CP with normal myoview in 08/2016 now admitted with epigastric pain with N/V,  elevated LFTs, cholelithiasis with GB thickening by CT and elevated trop.    Assessment & Plan    Chest pain with a moderate risk- - EKG is nonischemic but Trop mildly elevated and likely related to demand ischemia in setting of abdominal process - cardiac cath with no significant CAD - CP likely related to cholelithiasis and passed stone  Cholelithiasis- - Pt presented with epigastric pain, WBC 15k and temp of 99.8 and elevated LFTs. - MRI showed cholelithiasis with GB thickening but no distension of the GB and no evidence of acute cholecystitis and no evidence of biliary obstruction -Laparoscopic cholecystectomy done yesterday. Pt ready for discharge today per surgery, recommendations per surgery notes. No need for antibiotics at discharge per surgery. Follow up arranged by surgery.  - afebrile for > 24 hours and WBC now 9.2.  Moderate aortic stenosis- - In review of echo from 08/2016 and 02/13/2017 the mean AVG is stable at 26- 27mmHg with AVA 1.26-1.41cm2 c/w moderate AS.   - Appears that cholelithiasis has been the cause of her symptoms and pt had surgery yesterday. - Followup with Cardiology outpt  HTN, HCVD- -She denies any history of HTN and was not on medication for this prior to  admission.  -BP has been elevated and amlodipine 5mg  daily started for better BP control -BP is better since surgery yesterday.  Plan for discharge today after PT eval.  Will check a BMET and if stable ok to d/c.  Signed, Berton BonJanine Hammond, NP  02/16/2017, 9:22 AM    Patient seen and independently examined with Berton BonJanine Hammond, NP. We discussed all aspects of the encounter. I agree with the assessment and plan as stated above with some modification.  Patient s/p lab chole yesterday for symptomatic cholelithiasis and probable passed stone with elevated LFTs which are now trending downward.  WBC has normalized and she has been afebrile > 24 hours.  Per surgery, stable for discharge home on no antibx.  They have arranged followup.  She will have followup outpt for her moderate AS.  Continue amlodipine for HTN.  Repeat Hbg and BMET this am.  She is wheelchair bound at home so will get a PT eval to make sure she is able to transition from bed to chair on her own prior to discharge.   Signed: Armanda Magicraci Ival Basquez, MD [facility] 02/16/2017

## 2017-02-16 NOTE — Care Management Note (Signed)
Case Management Note  Patient Details  Name: [name] MRN: [id number] Date of Birth: 1933-07-21  Subjective/Objective:                 Spoke to patient at the bedside. She states she has been "handicapped" since 2012. She states hr Right knee no longer "works." She lives at home alone. She sleeps in a recliner and uses her WC to get around her house. She states she has fallen three times in the past 5 years since she has been wheelchair bound. She denies difficulties getting around her home or cooking. She states she has all the equipment she needs already. Her daughter lives about 3.5 miles from her. She uses a [employer] services for transportation to [employer] and to appointments.  PCP Kari BaarsHAWKINS, EDWARD  Pharmacy- [facility] delivers her medications to her.   Action/Plan:  Patient declines home needs, no CM needs identified.   Expected Discharge Date:                  Expected Discharge Plan:  Home/Self Care  In-House Referral:     Discharge planning Services  CM Consult  Post Acute Care Choice:    Choice offered to:     DME Arranged:    DME Agency:     HH Arranged:    HH Agency:     Status of Service:  Completed, signed off  If discussed at [employer] Length of Stay Meetings, dates discussed:    Additional Comments:  Lawerance SabalDebbie Laurali Goddard, RN 02/16/2017, 10:54 AM

## 2017-02-16 NOTE — Evaluation (Signed)
Physical Therapy Evaluation Patient Details Name: [name] MRN: [id number] DOB: 1933/01/24 Today's Date: 02/16/2017   History of Present Illness  83 y.o. female with a history of moderate AS, abdominal atherosclerosis, CP with normal myoview in 08/2016 now admitted with epigastric pain with N/V, elevated LFTs, cholelithiasis with GB thickening by CT and elevated trop.  Underwent lap chole. Has had bilateral TKA with several falls afterwards and has been w/c bound last 6 yrs.  Clinical Impression  Patient evaluated by Physical Therapy with no further acute PT needs identified. All education has been completed and the patient has no further questions. Pt is sore in abdomen and is moving slower than her baseline but was able to transfer out of bed to w/c and then w/c back to bed safely. See below for any follow-up Physical Therapy or equipment needs. PT is signing off. Thank you for this referral.     Follow Up Recommendations No PT follow up    Equipment Recommendations  None recommended by PT    Recommendations for Other Services       Precautions / Restrictions Precautions Precautions: Fall Restrictions Weight Bearing Restrictions: No      Mobility  Bed Mobility Overal bed mobility: Needs Assistance Bed Mobility: Supine to Sit;Sit to Supine     Supine to sit: Modified independent (Device/Increase time) Sit to supine: Mod assist   General bed mobility comments: pt able to get to EOB without physical assist but needs mod A to LE's to return to supine. Pt does not sleep in a ned at home though, she sleeps in her lift chair  Transfers Overall transfer level: Modified independent Equipment used: None             General transfer comment: lateral scoot transfer bed to chair and back to bed. Pt required more time than her normal to get into w/c but was able to do so safely. At home she has a sliding board to use as well  Ambulation/Gait             General  Gait Details: has not ambulated in 6 yrs  Stairs            Wheelchair Mobility    Modified Rankin (Stroke Patients Only)       Balance Overall balance assessment: History of Falls                                           Pertinent Vitals/Pain Pain Assessment: Faces Faces Pain Scale: Hurts little more Pain Location: abdomen Pain Descriptors / Indicators: Sore Pain Intervention(s): Limited activity within patient's tolerance;Monitored during session    Home Living Family/patient expects to be discharged to:: Private residence Living Arrangements: Alone Available Help at Discharge: Family;Available PRN/intermittently Type of Home: House Home Access: Ramped entrance     Home Layout: One level Home Equipment: Bedside commode;Tub bench;Walker - 2 wheels;Walker - standard;Walker - 4 wheels;Wheelchair - manual Additional Comments: neighbors very attentive, take her trash cans out and in. Sleeps in lift chair. One daughter and one son live nearby and check on her occasionally    Prior Function Level of Independence: Independent with assistive device(s)         Comments: w/c bound but cooks, cleans, bathes, on her own. Cares for 3 pets     Hand Dominance        Extremity/Trunk  Assessment   Upper Extremity Assessment Upper Extremity Assessment: Overall WFL for tasks assessed    Lower Extremity Assessment Lower Extremity Assessment: Generalized weakness    Cervical / Trunk Assessment Cervical / Trunk Assessment: Normal  Communication   Communication: No difficulties  Cognition Arousal/Alertness: Awake/alert Behavior During Therapy: WFL for tasks assessed/performed Overall Cognitive Status: Within Functional Limits for tasks assessed                                        General Comments General comments (skin integrity, edema, etc.): encouraged pt to have daughter stay with her tonight if able    Exercises      Assessment/Plan    PT Assessment Patent does not need any further PT services  PT Problem List         PT Treatment Interventions      PT Goals (Current goals can be found in the Care Plan section)  Acute Rehab PT Goals Patient Stated Goal: return home PT Goal Formulation: All assessment and education complete, DC therapy    Frequency     Barriers to discharge        Co-evaluation               AM-PAC PT "6 Clicks" Daily Activity  Outcome Measure Difficulty turning over in bed (including adjusting bedclothes, sheets and blankets)?: A Little Difficulty moving from lying on back to sitting on the side of the bed? : A Little Difficulty sitting down on and standing up from a chair with arms (e.g., wheelchair, bedside commode, etc,.)?: Total Help needed moving to and from a bed to chair (including a wheelchair)?: None Help needed walking in hospital room?: Total Help needed climbing 3-5 steps with a railing? : Total 6 Click Score: 13    End of Session   Activity Tolerance: Patient tolerated treatment well Patient left: in bed;with call bell/phone within reach Nurse Communication: Mobility status PT Visit Diagnosis: Muscle weakness (generalized) (M62.81);Pain Pain - part of body:  (abdomen)    Time: [id number] PT Time Calculation (min) (ACUTE ONLY): 33 min   Charges:   PT Evaluation $PT Eval Moderate Complexity: 1 Procedure PT Treatments $Therapeutic Activity: 8-22 mins   PT G Codes:        Lyanne CoVictoria Joannah Gitlin, PT  Acute Rehab Services  [phone]   CherokeeVictoria L Zoey Gilkeson 02/16/2017, 3:15 PM

## 2017-02-16 NOTE — Progress Notes (Signed)
Patient lying in bed, no acute distress noted.  Patient is alert and oriented.  Education completed on use of incentive spirometer; demonstration/teach back method used.  Patient demonstrated use of incentive spirometer. Mbr verbalizes to call for assistance if needed.  Door open.

## 2017-02-16 NOTE — Progress Notes (Signed)
Orders received for pt discharge.  Discharge summary printed and reviewed with pt's daughter.  Explained medication regimen with daughter, and she had no further questions at this time.  IV removed and site remains clean, dry, intact.  Telemetry removed.  Pt in stable condition and awaiting transport with daughter.

## 2017-02-17 ENCOUNTER — Other Ambulatory Visit: Payer: Self-pay | Admitting: Cardiology

## 2017-02-17 ENCOUNTER — Telehealth: Payer: Self-pay | Admitting: Adult Health

## 2017-02-17 ENCOUNTER — Ambulatory Visit (HOSPITAL_COMMUNITY): Payer: Medicare Other

## 2017-02-17 DIAGNOSIS — R7989 Other specified abnormal findings of blood chemistry: Secondary | ICD-10-CM

## 2017-02-17 NOTE — Telephone Encounter (Signed)
New message     Pt cancelled the venous doppler due to her being weak from the gallbladder surgery, please call if she needs theses tests stat, but otherwise it will be a week or so before she is back on her feet

## 2017-02-17 NOTE — Telephone Encounter (Signed)
Daughter informed that she can speak with the provider during the upcoming office visit whether or not the testing is still needed. Daughter verbalized understanding.

## 2017-03-03 DIAGNOSIS — R7989 Other specified abnormal findings of blood chemistry: Secondary | ICD-10-CM | POA: Diagnosis not present

## 2017-03-04 LAB — HEPATIC FUNCTION PANEL
ALBUMIN: 3.5 g/dL — AB (ref 3.6–5.1)
ALT: 9 U/L (ref 6–29)
AST: 15 U/L (ref 10–35)
Alkaline Phosphatase: 100 U/L (ref 33–130)
Bilirubin, Direct: 0.2 mg/dL (ref <=0.2)
Indirect Bilirubin: 0.6 mg/dL (ref 0.2–1.2)
TOTAL PROTEIN: 5.9 g/dL — AB (ref 6.1–8.1)
Total Bilirubin: 0.8 mg/dL (ref 0.2–1.2)

## 2017-03-10 ENCOUNTER — Ambulatory Visit (INDEPENDENT_AMBULATORY_CARE_PROVIDER_SITE_OTHER): Payer: Medicare Other | Admitting: Adult Health

## 2017-03-10 ENCOUNTER — Encounter: Payer: Self-pay | Admitting: Adult Health

## 2017-03-10 VITALS — BP 144/78 | HR 76 | Ht 69.0 in

## 2017-03-10 DIAGNOSIS — I350 Nonrheumatic aortic (valve) stenosis: Secondary | ICD-10-CM | POA: Diagnosis not present

## 2017-03-10 DIAGNOSIS — E7800 Pure hypercholesterolemia, unspecified: Secondary | ICD-10-CM

## 2017-03-10 DIAGNOSIS — R0789 Other chest pain: Secondary | ICD-10-CM | POA: Diagnosis not present

## 2017-03-10 NOTE — Progress Notes (Signed)
PT is aware.

## 2017-03-10 NOTE — Patient Instructions (Signed)
Your physician wants you to follow-up in: 1 Year with Dr. McDowell. You will receive a reminder letter in the mail two months in advance. If you don't receive a letter, please call our office to schedule the follow-up appointment.  Your physician recommends that you continue on your current medications as directed. Please refer to the Current Medication list given to you today.  If you need a refill on your cardiac medications before your next appointment, please call your pharmacy.  Thank you for choosing [facility]!   

## 2017-03-10 NOTE — Progress Notes (Signed)
Cardiology Office Note   Date:  03/10/2017   ID:  KARMIN KASPRZAK, DOB 08-05-1933, MRN [id number]  PCP:  Kari Baars, MD  Cardiologist:  Diona Browner  Chief Complaint  Patient presents with  . Hospitalization Follow-up      History of Present Illness: [name] is a 83 y.o. female who presents for post hospitalization follow-up, the patient was seen on consultation on 02/12/2017 in the setting of epigastric pain was associated nausea and vomiting. Patient has a history of moderate aortic valve stenosis, was found to have mildly elevated troponin. As result of this the patient was transferred to [facility] for cardiac catheterization was completed, on 02/14/2017.  Cardiac catheterization 02/14/2017   Conclusion: 1. Mild, nonobstructive coronary artery disease with very large, tortuous coronary arteries. No culprit lesion identified for the patient's elevated troponin. 2. Calcified aortic valve. Aortic valve was not crossed due to significant tortuosity of the right brachiocephalic and subclavian arteries as well as known aortic stenosis.  Recommendations: 1. Medical therapy for troponin elevation, which likely represents supply-demand mismatch in the setting of acute illness. 2. If further hemodynamic assessment of the aortic valve is necessary, consider left and right heart catheterization from femoral artery approach in the future.   [name] chest discomfort and epigastric pain were found to be noncardiac related. The patient underwent lap cholecystectomy on 02/15/2017.  She is doing very well post hospitalization. She was also to see her surgeon today. Unfortunately was scheduled with the same time as our appointment today. She denies any recurrent chest pain.  Past Medical History:  Diagnosis Date  . Anxiety   . Arthritis   . Blood transfusion   . Chronic bronchitis   . Constipation   . Depression   . Hyperlipidemia   . Hypothyroidism     Past  Surgical History:  Procedure Laterality Date  . CHOLECYSTECTOMY N/A 02/15/2017   Procedure: LAPAROSCOPIC CHOLECYSTECTOMY;  Surgeon: Axel Filler, MD;  Location: [facility] OR;  Service: General;  Laterality: N/A;  . CORONARY ANGIOGRAPHY N/A 02/14/2017   Procedure: Coronary Angiography;  Surgeon: Yvonne Kendall, MD;  [facility] INVASIVE CV LAB;  Service: Cardiovascular;  Laterality: N/A;  . EYE SURGERY  2009   bilateral  . HEMATOMA EVACUATION  06/04/2011   Procedure: EVACUATION HEMATOMA;  Surgeon: Fuller Canada, MD;  Location: [facility] ORS;  Service: Orthopedics;  Laterality: Right;  evacuation of seroma  . JOINT REPLACEMENT  05/2010   left total knee Dr. Romeo Apple  . Left hip replacement @ [facility] '98    . Left total knee    . PATELLAR TENDON REPAIR  06/04/2011   Procedure: PATELLA TENDON REPAIR;  Surgeon: Fuller Canada, MD;  Location: [facility] ORS;  Service: Orthopedics;  Laterality: Right;  . PATELLAR TENDON REPAIR  08/30/2011   Procedure: PATELLA TENDON REPAIR;  Surgeon: Fuller Canada, MD;  Location: [facility] ORS;  Service: Orthopedics;  Laterality: Right;  Allograft Reconstrucation Right Patella Tendon  . REPLACEMENT TOTAL KNEE BILATERAL    . TOTAL HIP ARTHROPLASTY    . TOTAL KNEE ARTHROPLASTY  05/03/2011   Procedure: TOTAL KNEE ARTHROPLASTY;  Surgeon: Fuller Canada, MD;  Location: [facility] ORS;  Service: Orthopedics;  Laterality: Right;  With DePuy  . TOTAL THYROIDECTOMY  March 2011   Dr. Suszanne Conners @ [facility]  . TUBAL LIGATION       Current Outpatient Prescriptions  Medication Sig Dispense Refill  . ALPRAZolam (XANAX) 0.25 MG tablet Take 1 tablet by mouth 4 (four) times daily.  4  .  aspirin EC 81 MG tablet Take 81 mg by mouth daily.    Marland Kitchen. darifenacin (ENABLEX) 15 MG 24 hr tablet Take 1 tablet (15 mg total) by mouth daily. Please have PCP refill. 30 tablet 1  . furosemide (LASIX) 40 MG tablet Take 40 mg by mouth.    Marland Kitchen. ibuprofen (ADVIL,MOTRIN) 200 MG tablet Take 600 mg by mouth daily as needed for moderate pain.      Marland Kitchen. levothyroxine (SYNTHROID, LEVOTHROID) 150 MCG tablet Take 150 mcg by mouth every morning.      Marland Kitchen. omeprazole (PRILOSEC) 20 MG capsule Take 20 mg by mouth daily.    Marland Kitchen. PARoxetine (PAXIL) 20 MG tablet Take 20 mg by mouth every evening.      . tolterodine (DETROL LA) 4 MG 24 hr capsule Take 4 mg by mouth daily.  10   No current facility-administered medications for this visit.     Allergies:   Patient has no known allergies.    Social History:  The patient  reports that she quit smoking about 38 years ago. She has never used smokeless tobacco. She reports that she does not drink alcohol or use drugs.   Family History:  [name] family history includes Blindness in her sister; Colon cancer in her mother.    ROS: All other systems are reviewed and negative. Unless otherwise mentioned in H&P    PHYSICAL EXAM: VS:  BP (!) 144/78   Pulse 76   Ht 5\' 9"  (1.753 m)   SpO2 97%  , BMI There is no height or weight on file to calculate BMI. GEN: Well nourished, well developed, in no acute distress  HEENT: normal  Neck: no JVD, carotid bruits, or masses Cardiac: RRR; 1/6 systolic murmur, rubs, or gallops,no edema  Respiratory:  clear to auscultation bilaterally, normal work of breathing GI: soft, nontender, nondistended, + BS. Dressing to abdomen from laparoscopic incision is not draining or bleeding. MS: no deformity or atrophy generalized deconditioning wheelchair-bound Skin: warm and dry, no rash Neuro:  Strength and sensation are intact Psych: euthymic mood, full affect   Recent Labs: 02/12/2017: B Natriuretic Peptide 137.0; Magnesium 1.9 02/16/2017: BUN 14; Creatinine, Ser 0.75; Hemoglobin 10.5; Platelets 257; Potassium 3.5; Sodium 138 03/03/2017: ALT 9    Lipid Panel No results found for: CHOL, TRIG, HDL, CHOLHDL, VLDL, LDLCALC, LDLDIRECT    Wt Readings from Last 3 Encounters:  02/16/17 250 lb 4.8 oz (113.5 kg)  08/30/16 244 lb 6.4 oz (110.9 kg)  10/23/13 220 lb (99.8 kg)       Other studies Reviewed:   Echocardiogram 02/13/2017 Study Conclusions  - Left ventricle: The cavity size was normal. Wall thickness was increased in a pattern of moderate LVH. Systolic function was vigorous. The estimated ejection fraction was in the range of 65% to 70%. Wall motion was normal; there were no regional wall motion abnormalities. Doppler parameters are consistent with abnormal left ventricular relaxation (grade 1 diastolic dysfunction). - Aortic valve: Moderately to severely calcified annulus. Severely calcified leaflets. There was moderate to severe stenosis. Mean gradient (S): 27 mm Hg. Peak gradient (S): 48 mm Hg. VTI ratio of LVOT to aortic valve: 0.4. Valve area (VTI): 1.26 cm^2. Valve area (Vmax): 1.17 cm^2. Valve area (Vmean): 1.15 cm^2. - Mitral valve: Moderately calcified annulus. There was trivial regurgitation. - Left atrium: The atrium was mildly dilated. - Right atrium: Central venous pressure (est): 3 mm Hg. - Tricuspid valve: There was mild regurgitation. - Pulmonary arteries: PA peak pressure: 50  mm Hg (S). - Pericardium, extracardiac: There was no pericardial effusion.  ASSESSMENT AND PLAN:  1.  Chest pain: Status post cardiac catheterization on 02/14/2017. This revealed moderate nonobstructive coronary artery disease with tortuous coronary arteries. Not found to be cardiac in etiology for her symptoms. Continue current medication regimen with aspirin. Primary care physician Dr. Juanetta Gosling is managing labs. Do not see that she is on statin therapy. Follow-up labs should be completed  2. Status post cholecystectomy: She unfortunately had an appointment with her surgeon at the same time she had her appointment with cardiology. She was not aware that this was duplicated concerning times. Our office will call the surgical team to re-schedule her for post operative follow-up a [facility].  3. Aortic stenosis  (moderate/to severe): She is completely asymptomatic concerning this. Valve area by VTI 1.26 cm. LVOT to aortic valve 0.4. Valve area by Vmax 1.17 cm. Currently she is asymptomatic. We'll continue to follow this. No plans to send her for aortic valve surgery at this time.  4. Hypercholesterolemia: Was not on statin due to elevated LFTs. Likely related to her gallbladder disease. Follow-up labs should be completed and possibility of adding statin to her medication regimen after labs are assessed.   Current medicines are reviewed at length with the patient today.    Labs/ tests ordered today include:  Bettey Mare. Liborio Nixon, ANP, AACC   03/10/2017 1:50 PM    [facility] [address] [place], [place] [address] Phone: [phone]; Fax: [phone]

## 2017-03-28 ENCOUNTER — Other Ambulatory Visit (HOSPITAL_COMMUNITY): Payer: Medicare Other

## 2017-03-29 ENCOUNTER — Encounter: Payer: Self-pay | Admitting: Gastroenterology

## 2017-04-01 ENCOUNTER — Ambulatory Visit: Payer: Medicare Other | Admitting: Cardiology

## 2017-04-04 DIAGNOSIS — N3281 Overactive bladder: Secondary | ICD-10-CM | POA: Diagnosis not present

## 2017-04-04 DIAGNOSIS — K210 Gastro-esophageal reflux disease with esophagitis: Secondary | ICD-10-CM | POA: Diagnosis not present

## 2017-04-22 DIAGNOSIS — B351 Tinea unguium: Secondary | ICD-10-CM | POA: Diagnosis not present

## 2017-04-22 DIAGNOSIS — L851 Acquired keratosis [keratoderma] palmaris et plantaris: Secondary | ICD-10-CM | POA: Diagnosis not present

## 2017-04-22 DIAGNOSIS — I739 Peripheral vascular disease, unspecified: Secondary | ICD-10-CM | POA: Diagnosis not present

## 2017-05-09 DIAGNOSIS — E039 Hypothyroidism, unspecified: Secondary | ICD-10-CM | POA: Diagnosis not present

## 2017-05-12 ENCOUNTER — Ambulatory Visit (INDEPENDENT_AMBULATORY_CARE_PROVIDER_SITE_OTHER): Payer: Medicare Other | Admitting: Gastroenterology

## 2017-05-12 ENCOUNTER — Encounter: Payer: Self-pay | Admitting: Gastroenterology

## 2017-05-12 DIAGNOSIS — R7989 Other specified abnormal findings of blood chemistry: Secondary | ICD-10-CM | POA: Insufficient documentation

## 2017-05-12 DIAGNOSIS — R945 Abnormal results of liver function studies: Principal | ICD-10-CM

## 2017-05-12 NOTE — Progress Notes (Signed)
CC'D TO PCP °

## 2017-05-12 NOTE — Progress Notes (Signed)
Referring Provider: Kari BaarsHawkins, Edward, MD Primary Care Physician:  Kari BaarsHawkins, Edward, MD Primary GI: Dr. Darrick PennaFields   Chief Complaint  Patient presents with  . elevated liver enzymes    HPI:   [name] is a 83 y.o. female presenting today with a history of elevated LFTs, cholelithiasis, MRCP without evidence of choledocholithiasis. Transferred to [facility] due to elevated troponins and underwent cholecystectomy May 2018. Here for hospital follow-up. LFTs completely returned to baseline thereafter as of June 2018.   Ate a peanut butter sandwich last night and it stuck to her esophagus. She said to herself "[name], you idiot". Had to vomit. Feeling better this morning. Avoiding meat due to finances this month and will eat more next month. Chicken noodle soup makes her sick because of "grease". Bagels with sour cream are good. No abdominal pain. Edentulous. Unable to afford dentures.   Past Medical History:  Diagnosis Date  . Anxiety   . Arthritis   . Blood transfusion   . Chronic bronchitis   . Constipation   . Depression   . Hyperlipidemia   . Hypothyroidism     Past Surgical History:  Procedure Laterality Date  . CHOLECYSTECTOMY N/A 02/15/2017   Procedure: LAPAROSCOPIC CHOLECYSTECTOMY;  Surgeon: Axel Filleramirez, Armando, MD;  [facility];  Service: General;  Laterality: N/A;  . CORONARY ANGIOGRAPHY N/A 02/14/2017   Procedure: Coronary Angiography;  Surgeon: Yvonne KendallEnd, Christopher, MD;  Location: [facility] INVASIVE CV LAB;  Service: Cardiovascular;  Laterality: N/A;  . EYE SURGERY  2009   bilateral  . HEMATOMA EVACUATION  06/04/2011   Procedure: EVACUATION HEMATOMA;  Surgeon: Fuller CanadaStanley Harrison, MD;  Location: [facility] ORS;  Service: Orthopedics;  Laterality: Right;  evacuation of seroma  . JOINT REPLACEMENT  05/2010   left total knee Dr. Romeo AppleHarrison  . [facility]    . Left total knee    . PATELLAR TENDON REPAIR  06/04/2011   Procedure: PATELLA TENDON REPAIR;  Surgeon: Fuller CanadaStanley  Harrison, MD;  Location: [facility] ORS;  Service: Orthopedics;  Laterality: Right;  . PATELLAR TENDON REPAIR  08/30/2011   Procedure: PATELLA TENDON REPAIR;  Surgeon: Fuller CanadaStanley Harrison, MD;  Location: [facility] ORS;  Service: Orthopedics;  Laterality: Right;  Allograft Reconstrucation Right Patella Tendon  . REPLACEMENT TOTAL KNEE BILATERAL    . TOTAL HIP ARTHROPLASTY    . TOTAL KNEE ARTHROPLASTY  05/03/2011   Procedure: TOTAL KNEE ARTHROPLASTY;  Surgeon: Fuller CanadaStanley Harrison, MD;  Location: [facility] ORS;  Service: Orthopedics;  Laterality: Right;  With DePuy  . TOTAL THYROIDECTOMY  March 2011   [facility]  . TUBAL LIGATION      Current Outpatient Prescriptions  Medication Sig Dispense Refill  . ALPRAZolam (XANAX) 0.25 MG tablet Take 1 tablet by mouth 4 (four) times daily.  4  . aspirin EC 81 MG tablet Take 81 mg by mouth daily.    . furosemide (LASIX) 20 MG tablet Take 1 tablet by mouth daily.  11  . ibuprofen (ADVIL,MOTRIN) 200 MG tablet Take 600 mg by mouth daily as needed for moderate pain.    Marland Kitchen. levothyroxine (SYNTHROID, LEVOTHROID) 150 MCG tablet Take 150 mcg by mouth every morning.      Marland Kitchen. omeprazole (PRILOSEC) 20 MG capsule Take 20 mg by mouth daily.    Marland Kitchen. PARoxetine (PAXIL) 20 MG tablet Take 20 mg by mouth every evening.      . TOVIAZ 8 MG TB24 tablet Take 1 tablet by mouth daily.  11  No current facility-administered medications for this visit.     Allergies as of 05/12/2017  . (No Known Allergies)    Family History  Problem Relation Age of Onset  . Colon cancer Mother   . Blindness Sister   . Anesthesia problems Neg Hx   . Hypotension Neg Hx   . Malignant hyperthermia Neg Hx   . Pseudochol deficiency Neg Hx     Social History   Social History  . Marital status: Widowed    Spouse name: N/A  . Number of children: N/A  . Years of education: N/A   Social History Main Topics  . Smoking status: Former Smoker    Quit date: 09/27/1978  . Smokeless tobacco: Never Used  . Alcohol use No  .  Drug use: No  . Sexual activity: No   Other Topics Concern  . None   Social History Narrative   ** Merged History Encounter **        Review of Systems: As mentioned in HPI   Physical Exam: BP 133/68   Pulse 67   Temp (!) 97 F (36.1 C) (Oral)   Ht 5\' 9"  (1.753 m)  General:   Alert and oriented. No distress noted. Pleasant and cooperative.  Head:  Normocephalic and atraumatic. Eyes:  Conjuctiva clear without scleral icterus. Mouth:  Oral mucosa pink and moist. Edentulous  Abdomen:  +BS, soft, non-tender and non-distended. Limited with patient in wheelchair.  Msk:  Symmetrical without gross deformities. Normal posture. Extremities:  With ankle edema Neurologic:  Alert and  oriented x4 Psych:  Alert and cooperative. Normal mood and affect.  Lab Results  Component Value Date   ALT 9 03/03/2017   AST 15 03/03/2017   ALKPHOS 100 03/03/2017   BILITOT 0.8 03/03/2017

## 2017-05-12 NOTE — Patient Instructions (Signed)
I am glad you are doing well!  We will see you back as needed!

## 2017-05-12 NOTE — Assessment & Plan Note (Signed)
Likely passed stone, undergoing [facility]. Doing well post-hospitalization. LFTs completely normalized as of June 2018. Return prn.   As of note, she is edentulous and unable to afford dentures. Discussed soft foods, chewing well, sitting upright.

## 2017-07-05 DIAGNOSIS — I10 Essential (primary) hypertension: Secondary | ICD-10-CM | POA: Diagnosis not present

## 2017-07-05 DIAGNOSIS — M159 Polyosteoarthritis, unspecified: Secondary | ICD-10-CM | POA: Diagnosis not present

## 2017-07-05 DIAGNOSIS — E785 Hyperlipidemia, unspecified: Secondary | ICD-10-CM | POA: Diagnosis not present

## 2017-07-05 DIAGNOSIS — E039 Hypothyroidism, unspecified: Secondary | ICD-10-CM | POA: Diagnosis not present

## 2017-08-05 DIAGNOSIS — I739 Peripheral vascular disease, unspecified: Secondary | ICD-10-CM | POA: Diagnosis not present

## 2017-08-05 DIAGNOSIS — L851 Acquired keratosis [keratoderma] palmaris et plantaris: Secondary | ICD-10-CM | POA: Diagnosis not present

## 2017-08-05 DIAGNOSIS — B351 Tinea unguium: Secondary | ICD-10-CM | POA: Diagnosis not present

## 2018-01-04 DIAGNOSIS — N3281 Overactive bladder: Secondary | ICD-10-CM | POA: Diagnosis not present

## 2018-01-04 DIAGNOSIS — M25512 Pain in left shoulder: Secondary | ICD-10-CM | POA: Diagnosis not present

## 2018-01-04 DIAGNOSIS — K210 Gastro-esophageal reflux disease with esophagitis: Secondary | ICD-10-CM | POA: Diagnosis not present

## 2018-01-04 DIAGNOSIS — E039 Hypothyroidism, unspecified: Secondary | ICD-10-CM | POA: Diagnosis not present

## 2018-03-24 ENCOUNTER — Encounter: Payer: Self-pay | Admitting: Cardiology

## 2018-03-24 NOTE — Progress Notes (Deleted)
Cardiology Office Note  Date: 03/24/2018   ID: [name], DOB Jun 03, 1933, MRN [id number]  PCP: Kari Baars, MD  Evaluating Cardiologist: Nona Dell, MD   No chief complaint on file.   History of Present Illness: [name] is an 84 y.o. female last seen by Ms. Lawrence DNP in June 2018 (I saw her once in consultation back in 2017).  I reviewed interval cardiac testing and updated the chart.  She was diagnosed with mild nonobstructive CAD at cardiac catheterization in May 2018.  She also had documentation of moderate to severe aortic stenosis at that point.  Past Medical History:  Diagnosis Date  . Anxiety   . Aortic stenosis   . Arthritis   . CAD (coronary artery disease)    Mild at cardiac catheterization 2018  . Chronic bronchitis   . Constipation   . Depression   . Hyperlipidemia   . Hypothyroidism     Past Surgical History:  Procedure Laterality Date  . CHOLECYSTECTOMY N/A 02/15/2017   Procedure: LAPAROSCOPIC CHOLECYSTECTOMY;  Surgeon: Axel Filler, MD;  Location: [facility] OR;  Service: General;  Laterality: N/A;  . CORONARY ANGIOGRAPHY N/A 02/14/2017   Procedure: Coronary Angiography;  Surgeon: Yvonne Kendall, MD;  [facility] CV LAB;  Service: Cardiovascular;  Laterality: N/A;  . EYE SURGERY  2009   bilateral  . HEMATOMA EVACUATION  06/04/2011   Procedure: EVACUATION HEMATOMA;  Surgeon: Fuller Canada, MD;  Location: [facility] ORS;  Service: Orthopedics;  Laterality: Right;  evacuation of seroma  . JOINT REPLACEMENT  05/2010   left total knee Dr. Romeo Apple  . Left hip replacement @ [facility] '98    . Left total knee    . PATELLAR TENDON REPAIR  06/04/2011   Procedure: PATELLA TENDON REPAIR;  Surgeon: Fuller Canada, MD;  Location: [facility] ORS;  Service: Orthopedics;  Laterality: Right;  . PATELLAR TENDON REPAIR  08/30/2011   Procedure: PATELLA TENDON REPAIR;  Surgeon: Fuller Canada, MD;  Location: [facility] ORS;  Service: Orthopedics;  Laterality:  Right;  Allograft Reconstrucation Right Patella Tendon  . REPLACEMENT TOTAL KNEE BILATERAL    . TOTAL HIP ARTHROPLASTY    . TOTAL KNEE ARTHROPLASTY  05/03/2011   Procedure: TOTAL KNEE ARTHROPLASTY;  Surgeon: Fuller Canada, MD;  Location: [facility] ORS;  Service: Orthopedics;  Laterality: Right;  With DePuy  . TOTAL THYROIDECTOMY  March 2011   Dr. Suszanne Conners @ [facility]  . TUBAL LIGATION      Current Outpatient Medications  Medication Sig Dispense Refill  . ALPRAZolam (XANAX) 0.25 MG tablet Take 1 tablet by mouth 4 (four) times daily.  4  . aspirin EC 81 MG tablet Take 81 mg by mouth daily.    . furosemide (LASIX) 20 MG tablet Take 1 tablet by mouth daily.  11  . ibuprofen (ADVIL,MOTRIN) 200 MG tablet Take 600 mg by mouth daily as needed for moderate pain.    Marland Kitchen levothyroxine (SYNTHROID, LEVOTHROID) 150 MCG tablet Take 150 mcg by mouth every morning.      Marland Kitchen omeprazole (PRILOSEC) 20 MG capsule Take 20 mg by mouth daily.    Marland Kitchen PARoxetine (PAXIL) 20 MG tablet Take 20 mg by mouth every evening.      . TOVIAZ 8 MG TB24 tablet Take 1 tablet by mouth daily.  11   No current facility-administered medications for this visit.    Allergies:  Patient has no known allergies.   Social History: The patient  reports that she quit smoking about 23  years ago. Her smoking use included cigarettes. She has never used smokeless tobacco. She reports that she does not drink alcohol or use drugs.   Family History: [name] family history includes Blindness in her sister; Colon cancer in her mother.   ROS:  Please see the history of present illness. Otherwise, complete review of systems is positive for {NONE DEFAULTED:18576::"none"}.  All other systems are reviewed and negative.   Physical Exam: VS:  There were no vitals taken for this visit., BMI There is no height or weight on file to calculate BMI.  Wt Readings from Last 3 Encounters:  02/16/17 250 lb 4.8 oz (113.5 kg)  08/30/16 244 lb 6.4 oz (110.9 kg)  10/23/13 220  lb (99.8 kg)    General: Patient appears comfortable at rest. HEENT: Conjunctiva and lids normal, oropharynx clear with moist mucosa. Neck: Supple, no elevated JVP or carotid bruits, no thyromegaly. Lungs: Clear to auscultation, nonlabored breathing at rest. Cardiac: Regular rate and rhythm, no S3 or significant systolic murmur, no pericardial rub. Abdomen: Soft, nontender, no hepatomegaly, bowel sounds present, no guarding or rebound. Extremities: No pitting edema, distal pulses 2+. Skin: Warm and dry. Musculoskeletal: No kyphosis. Neuropsychiatric: Alert and oriented x3, affect grossly appropriate.  ECG: I personally reviewed the tracing from 02/14/2017 which showed sinus rhythm with RSR' in lead V1 and V2, leftward axis, nonspecific ST changes.  Recent Labwork:  June 2018: AST 15, ALT 02 Feb 2017: Hemoglobin 10.5, platelets 257, potassium 3.5, BUN 14, creatinine 0.75  Other Studies Reviewed Today:  Cardiac catheterization 02/14/2017: Conclusion: 1. Mild, nonobstructive coronary artery disease with very large, tortuous coronary arteries. No culprit lesion identified for the patient's elevated troponin. 2. Calcified aortic valve. Aortic valve was not crossed due to significant tortuosity of the right brachiocephalic and subclavian arteries as well as known aortic stenosis.  Recommendations: 1. Medical therapy for troponin elevation, which likely represents supply-demand mismatch in the setting of acute illness. 2. If further hemodynamic assessment of the aortic valve is necessary, consider left and right heart catheterization from femoral artery approach in the future.  Echocardiogram 02/13/2017: Study Conclusions  - Left ventricle: The cavity size was normal. Wall thickness was   increased in a pattern of moderate LVH. Systolic function was   vigorous. The estimated ejection fraction was in the range of 65%   to 70%. Wall motion was normal; there were no regional wall   motion  abnormalities. Doppler parameters are consistent with   abnormal left ventricular relaxation (grade 1 diastolic   dysfunction). - Aortic valve: Moderately to severely calcified annulus. Severely   calcified leaflets. There was moderate to severe stenosis. Mean   gradient (S): 27 mm Hg. Peak gradient (S): 48 mm Hg. VTI ratio of   LVOT to aortic valve: 0.4. Valve area (VTI): 1.26 cm^2. Valve   area (Vmax): 1.17 cm^2. Valve area (Vmean): 1.15 cm^2. - Mitral valve: Moderately calcified annulus. There was trivial   regurgitation. - Left atrium: The atrium was mildly dilated. - Right atrium: Central venous pressure (est): 3 mm Hg. - Tricuspid valve: There was mild regurgitation. - Pulmonary arteries: PA peak pressure: 50 mm Hg (S). - Pericardium, extracardiac: There was no pericardial effusion.  Impressions:  - Moderate LVH with LVEF 65-70% and grade 1 diastolic dysfunction.   Mild left atrial enlargement. Moderate mitral annular   calcification with trivial mitral regurgitation. Moderate to   severe calcific aortic stenosis as outlined above. Mild tricuspid   regurgitation with PASP 50  mmHg.  Assessment and Plan:   Current medicines were reviewed with the patient today.  No orders of the defined types were placed in this encounter.   Disposition:  Signed, Jonelle SidleSamuel G. Kaelene Elliston, MD, [facility]ACC 03/24/2018 12:45 PM    [facility] [address], [place], [place] [address] Phone: [phone]; Fax: [phone]

## 2018-03-27 ENCOUNTER — Ambulatory Visit: Payer: Medicare Other | Admitting: Cardiology

## 2018-07-06 DIAGNOSIS — M159 Polyosteoarthritis, unspecified: Secondary | ICD-10-CM | POA: Diagnosis not present

## 2018-07-06 DIAGNOSIS — I10 Essential (primary) hypertension: Secondary | ICD-10-CM | POA: Diagnosis not present

## 2018-07-06 DIAGNOSIS — E039 Hypothyroidism, unspecified: Secondary | ICD-10-CM | POA: Diagnosis not present

## 2018-07-06 DIAGNOSIS — F419 Anxiety disorder, unspecified: Secondary | ICD-10-CM | POA: Diagnosis not present

## 2018-08-14 ENCOUNTER — Ambulatory Visit: Payer: Medicare Other | Admitting: Cardiology

## 2018-08-16 ENCOUNTER — Ambulatory Visit (INDEPENDENT_AMBULATORY_CARE_PROVIDER_SITE_OTHER): Payer: Medicare Other | Admitting: Cardiology

## 2018-08-16 ENCOUNTER — Encounter: Payer: Self-pay | Admitting: Cardiology

## 2018-08-16 VITALS — BP 138/82 | HR 76 | Ht 69.0 in | Wt 243.0 lb

## 2018-08-16 DIAGNOSIS — I2510 Atherosclerotic heart disease of native coronary artery without angina pectoris: Secondary | ICD-10-CM | POA: Diagnosis not present

## 2018-08-16 DIAGNOSIS — I350 Nonrheumatic aortic (valve) stenosis: Secondary | ICD-10-CM

## 2018-08-16 DIAGNOSIS — E039 Hypothyroidism, unspecified: Secondary | ICD-10-CM

## 2018-08-16 NOTE — Progress Notes (Signed)
Cardiology Office Note  Date: 08/16/2018   ID: [name], DOB [birth year], MRN [id number]  PCP: Kari Baars, MD  Primary Cardiologist: Nona Dell, MD   Chief Complaint  Patient presents with  . Aortic Stenosis    History of Present Illness: [name] is an 84 y.o. female last seen by Ms. Liborio Nixon in June 2018.  She presents overdue for follow-up.  She still lives in her own home, does basic ADLs including cooking and cleaning.  She is significantly limited by orthopedic problems, has had both knees replaced as well as her left hip, uses a wheelchair essentially at all times.  She tells me that she is able to push herself out to her mailbox, approximately 125 feet away from her door without stopping.  She does not report any exertional chest pain.  Cardiac catheterization and echocardiogram from May of last year are outlined below.  LVEF was 65 to 70% with grade 1 diastolic dysfunction.  Aortic valve showed evidence of moderate to severe stenosis with mean gradient 27 mmHg.  I personally reviewed her ECG today which shows sinus rhythm with intermittent left bundle branch block pattern.  Past Medical History:  Diagnosis Date  . Anxiety   . Aortic stenosis   . Arthritis   . CAD (coronary artery disease)    Mild at cardiac catheterization 2018  . Chronic bronchitis   . Constipation   . Depression   . Hyperlipidemia   . Hypothyroidism     Past Surgical History:  Procedure Laterality Date  . CHOLECYSTECTOMY N/A 02/15/2017   Procedure: LAPAROSCOPIC CHOLECYSTECTOMY;  Surgeon: Axel Filler, MD;  Location: [facility] OR;  Service: General;  Laterality: N/A;  . CORONARY ANGIOGRAPHY N/A 02/14/2017   Procedure: Coronary Angiography;  Surgeon: Yvonne Kendall, MD;  [facility];  Service: Cardiovascular;  Laterality: N/A;  . EYE SURGERY  2009   bilateral  . HEMATOMA EVACUATION  06/04/2011   Procedure: EVACUATION HEMATOMA;  Surgeon: Fuller Canada, MD;  Location: [facility] ORS;  Service: Orthopedics;  Laterality: Right;  evacuation of seroma  . JOINT REPLACEMENT  05/2010   left total knee Dr. Romeo Apple  . Left hip replacement @ [facility] '98    . Left total knee    . PATELLAR TENDON REPAIR  06/04/2011   Procedure: PATELLA TENDON REPAIR;  Surgeon: Fuller Canada, MD;  Location: [facility] ORS;  Service: Orthopedics;  Laterality: Right;  . PATELLAR TENDON REPAIR  08/30/2011   Procedure: PATELLA TENDON REPAIR;  Surgeon: Fuller Canada, MD;  Location: [facility] ORS;  Service: Orthopedics;  Laterality: Right;  Allograft Reconstrucation Right Patella Tendon  . REPLACEMENT TOTAL KNEE BILATERAL    . TOTAL HIP ARTHROPLASTY    . TOTAL KNEE ARTHROPLASTY  05/03/2011   Procedure: TOTAL KNEE ARTHROPLASTY;  Surgeon: Fuller Canada, MD;  Location: [facility] OR[facility];  Service: Orthopedics;  Laterality: Right;  With DePuy  . TOTAL THYROIDECTOMY  March 2011   Dr. Suszanne Conners @ [facility]  . TUBAL LIGATION      Current Outpatient Medications  Medication Sig Dispense Refill  . ALPRAZolam (XANAX) 0.25 MG tablet Take 1 tablet by mouth 4 (four) times daily.  4  . aspirin EC 81 MG tablet Take 81 mg by mouth daily.    . furosemide (LASIX) 20 MG tablet Take 1 tablet by mouth daily.  11  . ibuprofen (ADVIL,MOTRIN) 200 MG tablet Take 600 mg by mouth daily as needed for moderate pain.    Marland Kitchen levothyroxine (SYNTHROID,  LEVOTHROID) 150 MCG tablet Take 150 mcg by mouth every morning.      Marland Kitchen omeprazole (PRILOSEC) 20 MG capsule Take 20 mg by mouth daily.    Marland Kitchen PARoxetine (PAXIL) 20 MG tablet Take 20 mg by mouth every evening.      . TOVIAZ 8 MG TB24 tablet Take 1 tablet by mouth daily.  11   No current facility-administered medications for this visit.    Allergies:  Patient has no known allergies.   Social History: The patient  reports that she quit smoking about 39 years ago. Her smoking use included cigarettes. She has never used smokeless tobacco. She reports that she does not drink alcohol or use drugs.    ROS:  Please see the history of present illness. Otherwise, complete review of systems is positive for chronic leg weakness and gait instability.  All other systems are reviewed and negative.   Physical Exam: VS:  BP 138/82   Pulse 76   Ht 5\' 9"  (1.753 m)   Wt 243 lb (110.2 kg) Comment: weighed June 2018  SpO2 95%   BMI 35.88 kg/m , BMI Body mass index is 35.88 kg/m.  Wt Readings from Last 3 Encounters:  08/16/18 243 lb (110.2 kg)  02/16/17 250 lb 4.8 oz (113.5 kg)  08/30/16 244 lb 6.4 oz (110.9 kg)    General: Elderly woman in wheelchair. HEENT: Conjunctiva and lids normal, oropharynx clear. Neck: Supple, no elevated JVP or carotid bruits, no thyromegaly. Lungs: Clear to auscultation, nonlabored breathing at rest. Cardiac: Regular rate and rhythm, no S3, 3/6 systolic murmur, no pericardial rub. Abdomen: Soft, nontender, bowel sounds present. Extremities: Chronic appearing lower leg edema and venous stasis, distal pulses 2+. Skin: Warm and dry. Musculoskeletal: No kyphosis. Neuropsychiatric: Alert and oriented x3, affect grossly appropriate.  ECG: I personally reviewed the tracing from 02/14/2017 which showed sinus rhythm with leftward axis, nonspecific ST changes, R' in lead V1 and V2.  Recent Labwork:  May 2018: Hemoglobin 10.5, platelets 257, potassium 3.5, BUN 14, creatinine 0.75, AST 15, ALT 9  Other Studies Reviewed Today:  Cardiac catheterization 02/14/2017: Conclusion: 1. Mild, nonobstructive coronary artery disease with very large, tortuous coronary arteries. No culprit lesion identified for the patient's elevated troponin. 2. Calcified aortic valve. Aortic valve was not crossed due to significant tortuosity of the right brachiocephalic and subclavian arteries as well as known aortic stenosis.  Echocardiogram 02/13/2017: Study Conclusions  - Left ventricle: The cavity size was normal. Wall thickness was   increased in a pattern of moderate LVH. Systolic  function was   vigorous. The estimated ejection fraction was in the range of 65%   to 70%. Wall motion was normal; there were no regional wall   motion abnormalities. Doppler parameters are consistent with   abnormal left ventricular relaxation (grade 1 diastolic   dysfunction). - Aortic valve: Moderately to severely calcified annulus. Severely   calcified leaflets. There was moderate to severe stenosis. Mean   gradient (S): 27 mm Hg. Peak gradient (S): 48 mm Hg. VTI ratio of   LVOT to aortic valve: 0.4. Valve area (VTI): 1.26 cm^2. Valve   area (Vmax): 1.17 cm^2. Valve area (Vmean): 1.15 cm^2. - Mitral valve: Moderately calcified annulus. There was trivial   regurgitation. - Left atrium: The atrium was mildly dilated. - Right atrium: Central venous pressure (est): 3 mm Hg. - Tricuspid valve: There was mild regurgitation. - Pulmonary arteries: PA peak pressure: 50 mm Hg (S). - Pericardium, extracardiac: There was no  pericardial effusion.  Impressions:  - Moderate LVH with LVEF 65-70% and grade 1 diastolic dysfunction.   Mild left atrial enlargement. Moderate mitral annular   calcification with trivial mitral regurgitation. Moderate to   severe calcific aortic stenosis as outlined above. Mild tricuspid   regurgitation with PASP 50 mmHg.  Assessment and Plan:  1.  Moderate to severe calcific aortic stenosis by echocardiogram in May 2018.  Murmur is consistent with this.  Within the parameters of her orthopedic limitations, she does not report any obvious change in stamina.  Uses a wheelchair to get around her house, but is functional with ADLs.  Plan to obtain a follow-up echocardiogram for repeat evaluation.  2.  Mild, nonobstructive CAD by cardiac catheterization in May 2018.  No obvious angina symptoms are reported.  She remains on aspirin.  3.  Hypothyroidism, on Synthroid with follow-up per Dr. Juanetta GoslingHawkins.  Current medicines were reviewed with the patient today.   Orders  Placed This Encounter  Procedures  . EKG 12-Lead  . ECHOCARDIOGRAM COMPLETE    Disposition: Call with test results and determine follow-up plan.  Signed, Jonelle SidleSamuel G. Kacee Sukhu, MD, [facility] 08/16/2018 3:38 PM    [facility] [address], [place], [place] [address] Phone: [phone]; Fax: [phone]

## 2018-08-16 NOTE — Patient Instructions (Addendum)
Medication Instructions:  Your physician recommends that you continue on your current medications as directed. Please refer to the Current Medication list given to you today.  If you need a refill on your cardiac medications before your next appointment, please call your pharmacy.   Lab work: none If you have labs (blood work) drawn today and your tests are completely normal, you will receive your results only by: Marland Kitchen. MyChart Message (if you have MyChart) OR . A paper copy in the mail If you have any lab test that is abnormal or we need to change your treatment, we will call you to review the results.  Testing/Procedures: Your physician has requested that you have an echocardiogram. Echocardiography is a painless test that uses sound waves to create images of your heart. It provides your doctor with information about the size and shape of your heart and how well your heart's chambers and valves are working. This procedure takes approximately one hour. There are no restrictions for this procedure.    Follow-Up: to be determined after echo

## 2018-08-28 ENCOUNTER — Ambulatory Visit (HOSPITAL_COMMUNITY)
Admission: RE | Admit: 2018-08-28 | Discharge: 2018-08-28 | Disposition: A | Discharge status: Home / Self Care | Payer: Medicare Other | Source: Ambulatory Visit | Attending: Cardiology | Admitting: Cardiology

## 2018-08-28 DIAGNOSIS — I350 Nonrheumatic aortic (valve) stenosis: Secondary | ICD-10-CM | POA: Diagnosis not present

## 2018-08-28 NOTE — Progress Notes (Signed)
*  PRELIMINARY RESULTS* Echocardiogram 2D Echocardiogram has been performed.  Jeryl Columbialliott, Ezreal Turay 08/28/2018, 2:27 PM

## 2018-11-27 ENCOUNTER — Telehealth: Payer: Self-pay | Admitting: Cardiology

## 2018-11-27 NOTE — Telephone Encounter (Signed)
Had 3 "dizzy spells" 3 weeks ago which lasted 2 minutes. While sitting in wheelchair she noted symptoms. Then today, she had another "dizzy spell for 2 minutes, again sitting in wheelchair,resolved spontaneously . She took her meds, ate and then took a nap and feels great. I told her to attention to symptoms, get BP and HR if possible if it happens again

## 2018-11-27 NOTE — Telephone Encounter (Signed)
Pt has had 4 dizzy spells today and 1 this morning, she was sitting down when they occurred, she did not pass out just felt "woozy" and "puny", would like a check up to see if there's anything they can discover. Please return her call @ [phone]

## 2019-05-09 ENCOUNTER — Telehealth: Payer: Self-pay | Admitting: Cardiology

## 2019-05-09 NOTE — Telephone Encounter (Signed)

## 2019-05-23 ENCOUNTER — Telehealth (INDEPENDENT_AMBULATORY_CARE_PROVIDER_SITE_OTHER): Payer: Medicare HMO | Admitting: Cardiology

## 2019-05-23 ENCOUNTER — Encounter: Payer: Self-pay | Admitting: Cardiology

## 2019-05-23 VITALS — Ht 69.0 in

## 2019-05-23 DIAGNOSIS — I350 Nonrheumatic aortic (valve) stenosis: Secondary | ICD-10-CM | POA: Diagnosis not present

## 2019-05-23 DIAGNOSIS — I2510 Atherosclerotic heart disease of native coronary artery without angina pectoris: Secondary | ICD-10-CM

## 2019-05-23 DIAGNOSIS — Z7189 Other specified counseling: Secondary | ICD-10-CM

## 2019-05-23 NOTE — Progress Notes (Signed)
Virtual Visit via Telephone Note   This visit type was conducted due to national recommendations for restrictions regarding the COVID-19 Pandemic (e.g. social distancing) in an effort to limit this patient's exposure and mitigate transmission in our community.  Due to her co-morbid illnesses, this patient is at least at moderate risk for complications without adequate follow up.  This format is felt to be most appropriate for this patient at this time.  The patient did not have access to video technology/had technical difficulties with video requiring transitioning to audio format only (telephone).  All issues noted in this document were discussed and addressed.  No physical exam could be performed with this format.  Please refer to the patient's chart for her  consent to telehealth for [facility].   Date:  05/23/2019   ID:  [name], DOB Feb 24, 1933, MRN [id number]  Patient Location: Home Provider Location: Office  PCP:  Kari Baars, MD  Cardiologist:  Nona Dell, MD Electrophysiologist:  None   Evaluation Performed:  Follow-Up Visit  Chief Complaint:   Cardiac follow-up  History of Present Illness:    [name] is an 85 y.o. female last seen in November 2019.  We spoke by phone today.  She tells me that she has been doing well.  She has been out of her house only once in the last few months.  She uses a wheelchair consistently at home with chronic arthritic knee pains.  She is functional with ADLs otherwise and does not report any progressive shortness of breath, palpitations, chest pain, or syncope.  Follow-up echocardiogram in December 2019 revealed LVEF 65 to 70% with mild diastolic dysfunction and moderate to severe aortic stenosis, mean gradient 27 mmHg which was relatively stable compared to the prior study.  We have discussed these results and continue with observation at this point.  I reviewed her medications which are outlined below.  The  patient does not have symptoms concerning for COVID-19 infection (fever, chills, cough, or new shortness of breath).    Past Medical History:  Diagnosis Date  . Anxiety   . Aortic stenosis   . Arthritis   . CAD (coronary artery disease)    Mild at cardiac catheterization 2018  . Chronic bronchitis   . Constipation   . Depression   . Hyperlipidemia   . Hypothyroidism    Past Surgical History:  Procedure Laterality Date  . CHOLECYSTECTOMY N/A 02/15/2017   Procedure: LAPAROSCOPIC CHOLECYSTECTOMY;  Surgeon: Axel Filler, MD;  Location: [facility] OR;  Service: General;  Laterality: N/A;  . CORONARY ANGIOGRAPHY N/A 02/14/2017   Procedure: Coronary Angiography;  Surgeon: Paige Kendall, MD;  Location: [facility] INV[facility] LAB;  Service: Cardiovascular;  Laterality: N/A;  . EYE SURGERY  2009   bilateral  . HEMATOMA EVACUATION  06/04/2011   Procedure: EVACUATION HEMATOMA;  Surgeon: Paige Canada, MD;  Location: [facility] OR[facility];  Service: Orthopedics;  Laterality: Right;  evacuation of seroma  . JOINT REPLACEMENT  05/2010   left total knee Dr. Romeo Apple  . Left hip replacement @ [facility] '98    . Left total knee    . PATELLAR TENDON REPAIR  06/04/2011   Procedure: PATELLA TENDON REPAIR;  Surgeon: Paige Canada, MD;  Location: [facility];  Service: Orthopedics;  Laterality: Right;  . PATELLAR TENDON REPAIR  08/30/2011   Procedure: PATELLA TENDON REPAIR;  Surgeon: Paige Canada, MD;  Location: [facility] OR[facility];  Service: Orthopedics;  Laterality: Right;  Allograft Reconstrucation Right Patella Tendon  . REPLACEMENT  TOTAL KNEE BILATERAL    . TOTAL HIP ARTHROPLASTY    . TOTAL KNEE ARTHROPLASTY  05/03/2011   Procedure: TOTAL KNEE ARTHROPLASTY;  Surgeon: Paige CanadaStanley Harrison, MD;  Location: [facility];  Service: Orthopedics;  Laterality: Right;  With DePuy  . TOTAL THYROIDECTOMY  March 2011   [facility]  . TUBAL LIGATION       Current Meds  Medication Sig  . ALPRAZolam (XANAX) 0.25 MG tablet Take 1 tablet by mouth 4 (four)  times daily.  Marland Kitchen. aspirin EC 81 MG tablet Take 81 mg by mouth daily.  . cholecalciferol (VITAMIN D3) 25 MCG (1000 UT) tablet Take 1,000 Units by mouth daily.  . furosemide (LASIX) 20 MG tablet Take 1 tablet by mouth daily.  Marland Kitchen. ibuprofen (ADVIL,MOTRIN) 200 MG tablet Take 600 mg by mouth daily as needed for moderate pain.  Marland Kitchen. levothyroxine (SYNTHROID, LEVOTHROID) 150 MCG tablet Take 150 mcg by mouth every morning.    Marland Kitchen. PARoxetine (PAXIL) 20 MG tablet Take 20 mg by mouth every evening.    . TOVIAZ 8 MG TB24 tablet Take 1 tablet by mouth daily.     Allergies:   Patient has no known allergies.   Social History   Tobacco Use  . Smoking status: Former Smoker    Types: Cigarettes    Quit date: 09/27/1978    Years since quitting: 40.6  . Smokeless tobacco: Never Used  Substance Use Topics  . Alcohol use: No  . Drug use: No     Family Hx: [name] family history includes Blindness in her sister; Colon cancer in her mother. There is no history of Anesthesia problems, Hypotension, Malignant hyperthermia, or Pseudochol deficiency.  ROS:   Please see the history of present illness. All other systems reviewed and are negative.   Prior CV studies:   The following studies were reviewed today:  Cardiac catheterization 02/14/2017: Conclusion: 1. Mild, nonobstructive coronary artery disease with very large, tortuous coronary arteries. No culprit lesion identified for the patient's elevated troponin. 2. Calcified aortic valve. Aortic valve was not crossed due to significant tortuosity of the right brachiocephalic and subclavian arteries as well as known aortic stenosis.  Echocardiogram 08/28/2018: Study Conclusions  - Left ventricle: The cavity size was normal. Wall thickness was   increased in a pattern of mild LVH. Systolic function was   vigorous. The estimated ejection fraction was in the range of 65%   to 70%. Wall motion was normal; there were no regional wall   motion abnormalities.  Doppler parameters are consistent with   abnormal left ventricular relaxation (grade 1 diastolic   dysfunction). - Aortic valve: Moderately calcified annulus. Probably trileaflet;   severely calcified leaflets. There was moderate to severe   stenosis. Mean gradient (S): 27 mm Hg. Peak gradient (S): 52 mm   Hg. VTI ratio of LVOT to aortic valve: 0.38. Valve area (VTI):   1.3 cm^2. Valve area (Vmax): 1.07 cm^2. Valve area (Vmean): 1.17   cm^2. - Mitral valve: Moderately calcified annulus. There was trivial   regurgitation. - Right atrium: Central venous pressure (est): 3 mm Hg. - Atrial septum: No defect or patent foramen ovale was identified. - Tricuspid valve: There was trivial regurgitation. - Pulmonary arteries: Systolic pressure could not be accurately   estimated. - Pericardium, extracardiac: A prominent pericardial fat pad was   present.  Labs/Other Tests and Data Reviewed:    EKG:  An ECG dated 08/16/2018 was personally reviewed today and demonstrated:  Sinus  rhythm with left bundle branch block.  Recent Labs:  No interval lab work for review.  Wt Readings from Last 3 Encounters:  08/16/18 243 lb (110.2 kg)  02/16/17 250 lb 4.8 oz (113.5 kg)  08/30/16 244 lb 6.4 oz (110.9 kg)     Objective:    Vital Signs:  Ht 5\' 9"  (1.753 m)   BMI 35.88 kg/m    She did not have a way to check vital signs today. Patient spoke in full sentences, not short of breath. No audible wheezing or coughing. Speech pattern normal.  ASSESSMENT & PLAN:    1.  Moderate to severe calcific aortic stenosis, stable by follow-up echocardiogram in December 2019.  We continue with observation at this point. She has functional limitations at baseline and does not report any progressive symptomatology.  Follow-up echocardiogram for her next visit in 6 months.  2.  Nonobstructive coronary atherosclerosis by cardiac catheterization in May 2018.  She does not report any active angina symptoms and  continues on aspirin.  3.  Hypothyroidism on Synthroid.  She continues to follow with Dr. Luan Pulling.  COVID-19 Education: The signs and symptoms of COVID-19 were discussed with the patient and how to seek care for testing (follow up with PCP or arrange E-visit).  The importance of social distancing was discussed today.  Time:   Today, I have spent 8 minutes with the patient with telehealth technology discussing the above problems.     Medication Adjustments/Labs and Tests Ordered: Current medicines are reviewed at length with the patient today.  Concerns regarding medicines are outlined above.   Tests Ordered: Orders Placed This Encounter  Procedures  . ECHOCARDIOGRAM COMPLETE    Medication Changes: No orders of the defined types were placed in this encounter.   Follow Up:  Virtual Visit or In Person 6 months in the [place] office.  Signed, Rozann Lesches, MD  05/23/2019 1:14 PM    [facility]

## 2019-05-23 NOTE — Patient Instructions (Addendum)
Medication Instructions:   Your physician recommends that you continue on your current medications as directed. Please refer to the Current Medication list given to you today.  Labwork:  NONE  Testing/Procedures: Your physician has requested that you have an echocardiogram in 6 months. Echocardiography is a painless test that uses sound waves to create images of your heart. It provides your doctor with information about the size and shape of your heart and how well your heart's chambers and valves are working. This procedure takes approximately one hour. There are no restrictions for this procedure.  Follow-Up:  Your physician recommends that you schedule a follow-up appointment in: 6 months. You will receive a reminder letter in the mail in about 4 months reminding you to call and schedule your appointment. If you don't receive this letter, please contact our office.  Any Other Special Instructions Will Be Listed Below (If Applicable).  If you need a refill on your cardiac medications before your next appointment, please call your pharmacy. 

## 2019-11-15 ENCOUNTER — Telehealth: Payer: Self-pay | Admitting: Cardiology

## 2019-11-15 MED ORDER — TOVIAZ 8 MG PO TB24: 8.0000 mg | ORAL_TABLET | Freq: Every day | ORAL | 0 refills | Status: DC

## 2019-11-15 NOTE — Telephone Encounter (Signed)
Pt is needing a refill on her [name]-- previous pt of Dr. Juanetta Gosling and does not currently have a PCP. Would like to know if Dr. Diona Browner would send in another Rx for her.   Pt is afraid to leave her home and be exposed to Covid   Pt uses [facility]  Please call [phone]

## 2019-11-15 NOTE — Telephone Encounter (Signed)
Refill complete 

## 2019-11-15 NOTE — Telephone Encounter (Signed)
    Covering for Dr. Diona Browner - Can provide 30 day Rx for Toviaz with no refills. Please provide her with the [facility] number to help her obtain a PCP. Would also inform her several PCP's are doing telehealth visits so this could be arranged so she can be safe but still have her medical concerns addressed.  Signed, Ellsworth Lennox, PA-C 11/15/2019, 2:45 PM Pager: [phone]

## 2019-11-16 NOTE — Telephone Encounter (Signed)
Call to inform pt that refill complete and that [facility] number mailed to help obtain new PCP. No answer. No voicemail.

## 2019-11-21 ENCOUNTER — Other Ambulatory Visit: Payer: Self-pay | Admitting: Family Medicine

## 2019-11-22 ENCOUNTER — Other Ambulatory Visit: Payer: Medicare HMO

## 2019-11-26 NOTE — Progress Notes (Signed)
Cardiology Office Note  Date: 11/27/2019   ID: [name], DOB 11-18-1932, MRN [id number]  PCP:  Wandra Feinstein, MD  Cardiologist:  Nona Dell, MD Electrophysiologist:  None   Chief Complaint  Patient presents with  . Cardiac follow-up    History of Present Illness: [name] is an 86 y.o. female last assessed via telehealth encounter in August 2020.  She presents for a routine visit.  She tells me that this is the first time she has been out of her house to any significant degree over the last year during the pandemic.  Her daughter brings in groceries on a regular basis.  She remains functionally limited, uses a wheelchair at home, but is able to do ADLs, states that she mopped her floor recently.  She does not describe any obvious angina symptoms or worsening shortness of breath, no palpitations or syncope.  I reviewed her medications which are outlined below.  She remains on aspirin and Lasix.  I personally reviewed her ECG today which shows an ectopic atrial rhythm with increased voltage and repolarization abnormalities.  Last echocardiogram was in December 2019, at that point LVEF was 65 to 70% and she had moderate to severe calcific aortic stenosis with mean gradient 27 mmHg and dimensionless index of 0.38.  She is overdue for follow-up.  Dr. Juanetta Gosling has retired and she has established with Dr. Judee Clara with planned visit in May.  Past Medical History:  Diagnosis Date  . Anxiety   . Aortic stenosis   . Arthritis   . CAD (coronary artery disease)    Mild at cardiac catheterization 2018  . Chronic bronchitis   . Constipation   . Depression   . Hyperlipidemia   . Hypothyroidism     Past Surgical History:  Procedure Laterality Date  . CHOLECYSTECTOMY N/A 02/15/2017   Procedure: LAPAROSCOPIC CHOLECYSTECTOMY;  Surgeon: Axel Filler, MD;  Location: [facility] OR;  Service: General;  Laterality: N/A;  . CORONARY ANGIOGRAPHY N/A 02/14/2017   Procedure:  Coronary Angiography;  Surgeon: Yvonne Kendall, MD;  Location: [facility] INVASIVE CV LAB;  Service: Cardiovascular;  Laterality: N/A;  . EYE SURGERY  2009   bilateral  . HEMATOMA EVACUATION  06/04/2011   Procedure: EVACUATION HEMATOMA;  Surgeon: Fuller Canada, MD;  Location: [facility] ORS;  Service: Orthopedics;  Laterality: Right;  evacuation of seroma  . JOINT REPLACEMENT  05/2010   left total knee Dr. Romeo Apple  . Left hip replacement @ [facility] '98    . Left total knee    . PATELLAR TENDON REPAIR  06/04/2011   Procedure: PATELLA TENDON REPAIR;  Surgeon: Fuller Canada, MD;  Location: [facility] ORS;  Service: Orthopedics;  Laterality: Right;  . PATELLAR TENDON REPAIR  08/30/2011   Procedure: PATELLA TENDON REPAIR;  Surgeon: Fuller Canada, MD;  Location: [facility] ORS;  Service: Orthopedics;  Laterality: Right;  Allograft Reconstrucation Right Patella Tendon  . REPLACEMENT TOTAL KNEE BILATERAL    . TOTAL HIP ARTHROPLASTY    . TOTAL KNEE ARTHROPLASTY  05/03/2011   Procedure: TOTAL KNEE ARTHROPLASTY;  Surgeon: Fuller Canada, MD;  Location: [facility] ORS;  Service: Orthopedics;  Laterality: Right;  With DePuy  . TOTAL THYROIDECTOMY  March 2011   Dr. Suszanne Conners @ [facility]  . TUBAL LIGATION      Current Outpatient Medications  Medication Sig Dispense Refill  . ALPRAZolam (XANAX) 0.25 MG tablet Take 1 tablet by mouth 4 (four) times daily.  4  . aspirin EC 81 MG tablet Take 81  mg by mouth daily.    . cholecalciferol (VITAMIN D3) 25 MCG (1000 UT) tablet Take 1,000 Units by mouth daily.    . furosemide (LASIX) 20 MG tablet Take 1 tablet by mouth daily.  11  . ibuprofen (ADVIL,MOTRIN) 200 MG tablet Take 600 mg by mouth daily as needed for moderate pain.    Marland Kitchen levothyroxine (SYNTHROID, LEVOTHROID) 150 MCG tablet Take 150 mcg by mouth every morning.      Marland Kitchen omeprazole (PRILOSEC) 20 MG capsule Take 20 mg by mouth daily.    Marland Kitchen PARoxetine (PAXIL) 20 MG tablet Take 20 mg by mouth every evening.      . TOVIAZ 8 MG TB24 tablet Take 1 tablet (8 mg  total) by mouth daily. 30 tablet 0   No current facility-administered medications for this visit.   Allergies:  Patient has no known allergies.   ROS:   No cough, fevers or chills.  She did have a GI illness in January.  Physical Exam: VS:  BP 132/78   Pulse 78   Temp (!) 97.2 F (36.2 C)   Ht 5' 9.5" (1.765 m)   SpO2 97%   BMI 35.37 kg/m , BMI Body mass index is 35.37 kg/m.  Wt Readings from Last 3 Encounters:  08/16/18 243 lb (110.2 kg)  02/16/17 250 lb 4.8 oz (113.5 kg)  08/30/16 244 lb 6.4 oz (110.9 kg)    General: Elderly woman, seated in wheelchair. HEENT: Conjunctiva and lids normal, wearing a mask. Neck: Supple, no elevated JVP or carotid bruits, no thyromegaly. Lungs: Clear to auscultation, nonlabored breathing at rest. Cardiac: Regular rate and rhythm, no S3, 4/6 systolic murmur consistent with aortic stenosis, no pericardial rub. Abdomen: Soft, nontender, bowel sounds present. Extremities: Chronic appearing leg edema and venous stasis, distal pulses 2+. Skin: Warm and dry. Musculoskeletal: Kyphosis. Neuropsychiatric: Alert and oriented x3, affect grossly appropriate.  ECG:  An ECG dated 08/16/2018 was personally reviewed today and demonstrated:  Sinus rhythm with left bundle branch block.  Recent Labwork:  No interval lab work for review.  Other Studies Reviewed Today:  Cardiac catheterization 02/14/2017: Conclusion: 1. Mild, nonobstructive coronary artery disease with very large, tortuous coronary arteries. No culprit lesion identified for the patient's elevated troponin. 2. Calcified aortic valve. Aortic valve was not crossed due to significant tortuosity of the right brachiocephalic and subclavian arteries as well as known aortic stenosis.  Echocardiogram 08/28/2018: Study Conclusions  - Left ventricle: The cavity size was normal. Wall thickness was increased in a pattern of mild LVH. Systolic function was vigorous. The estimated ejection  fraction was in the range of 65% to 70%. Wall motion was normal; there were no regional wall motion abnormalities. Doppler parameters are consistent with abnormal left ventricular relaxation (grade 1 diastolic dysfunction). - Aortic valve: Moderately calcified annulus. Probably trileaflet; severely calcified leaflets. There was moderate to severe stenosis. Mean gradient (S): 27 mm Hg. Peak gradient (S): 52 mm Hg. VTI ratio of LVOT to aortic valve: 0.38. Valve area (VTI): 1.3 cm^2. Valve area (Vmax): 1.07 cm^2. Valve area (Vmean): 1.17 cm^2. - Mitral valve: Moderately calcified annulus. There was trivial regurgitation. - Right atrium: Central venous pressure (est): 3 mm Hg. - Atrial septum: No defect or patent foramen ovale was identified. - Tricuspid valve: There was trivial regurgitation. - Pulmonary arteries: Systolic pressure could not be accurately estimated. - Pericardium, extracardiac: A prominent pericardial fat pad was present.  Assessment and Plan:  1.  History of moderate to severe calcific aortic  stenosis.  She is overdue for a follow-up echocardiogram which will be arranged.  Last study from December 2019 is reviewed above.  2.  Nonobstructive coronary artery disease by cardiac catheterization in 2018.  No obvious angina at current level of activity.  Continue aspirin.  She is currently not on statin therapy.  Medication Adjustments/Labs and Tests Ordered: Current medicines are reviewed at length with the patient today.  Concerns regarding medicines are outlined above.   Tests Ordered: Orders Placed This Encounter  Procedures  . EKG 12-Lead  . ECHOCARDIOGRAM COMPLETE    Medication Changes: No orders of the defined types were placed in this encounter.   Disposition:  Follow up 6 months in the [place] office.  Signed, Jonelle Sidle, MD, [facility] 11/27/2019 9:22 AM    [facility] at [facility] [address], [place], [place] [address] Phone: [phone]; Fax: [phone]

## 2019-11-27 ENCOUNTER — Ambulatory Visit: Payer: Medicare Other | Admitting: Cardiology

## 2019-11-27 ENCOUNTER — Other Ambulatory Visit: Payer: Self-pay

## 2019-11-27 ENCOUNTER — Encounter: Payer: Self-pay | Admitting: Cardiology

## 2019-11-27 VITALS — BP 132/78 | HR 78 | Temp 97.2°F | Ht 69.5 in

## 2019-11-27 DIAGNOSIS — I350 Nonrheumatic aortic (valve) stenosis: Secondary | ICD-10-CM

## 2019-11-27 DIAGNOSIS — I2510 Atherosclerotic heart disease of native coronary artery without angina pectoris: Secondary | ICD-10-CM

## 2019-11-27 NOTE — Patient Instructions (Signed)
Medication Instructions:  Your physician recommends that you continue on your current medications as directed. Please refer to the Current Medication list given to you today.  *If you need a refill on your cardiac medications before your next appointment, please call your pharmacy*   Lab Work: NONE TODAY If you have labs (blood work) drawn today and your tests are completely normal, you will receive your results only by: Marland Kitchen MyChart Message (if you have MyChart) OR . A paper copy in the mail If you have any lab test that is abnormal or we need to change your treatment, we will call you to review the results.   Testing/Procedures: Your physician has requested that you have an echocardiogram. Echocardiography is a painless test that uses sound waves to create images of your heart. It provides your doctor with information about the size and shape of your heart and how well your heart's chambers and valves are working. This procedure takes approximately one hour. There are no restrictions for this procedure.     Follow-Up: At [facility], you and your health needs are our priority.  As part of our continuing mission to provide you with exceptional heart care, we have created designated Provider Care Teams.  These Care Teams include your primary Cardiologist (physician) and Advanced Practice Providers (APPs -  Physician Assistants and Nurse Practitioners) who all work together to provide you with the care you need, when you need it.  We recommend signing up for the patient portal called "MyChart".  Sign up information is provided on this After Visit Summary.  MyChart is used to connect with patients for Virtual Visits (Telemedicine).  Patients are able to view lab/test results, encounter notes, upcoming appointments, etc.  Non-urgent messages can be sent to your provider as well.   To learn more about what you can do with MyChart, go to [email or web address].    Your next appointment:   6  month(s)  The format for your next appointment:   In Person  Provider:   Nona Dell, MD   Other Instructions None     Thank you for choosing [facility] !

## 2019-12-03 ENCOUNTER — Other Ambulatory Visit: Payer: Self-pay

## 2019-12-03 ENCOUNTER — Ambulatory Visit (HOSPITAL_COMMUNITY)
Admission: RE | Admit: 2019-12-03 | Discharge: 2019-12-03 | Disposition: A | Discharge status: Home / Self Care | Payer: Medicare Other | Source: Ambulatory Visit | Attending: Cardiology | Admitting: Cardiology

## 2019-12-03 DIAGNOSIS — I350 Nonrheumatic aortic (valve) stenosis: Secondary | ICD-10-CM | POA: Diagnosis not present

## 2019-12-03 NOTE — Progress Notes (Signed)
*  PRELIMINARY RESULTS* Echocardiogram 2D Echocardiogram has been performed.  Stacey Drain 12/03/2019, 3:02 PM

## 2019-12-04 ENCOUNTER — Telehealth: Payer: Self-pay

## 2019-12-04 DIAGNOSIS — I350 Nonrheumatic aortic (valve) stenosis: Secondary | ICD-10-CM

## 2019-12-04 NOTE — Telephone Encounter (Signed)
Patent notified, will have echo just before sept 20 th apt

## 2019-12-04 NOTE — Telephone Encounter (Signed)
-----   Message from Jonelle Sidle, MD sent at 12/03/2019  4:10 PM EST ----- Results reviewed.  Moderate to severe calcific aorta stenosis, I suspect severe range and progression compared to the prior study.  She was relatively stable as of recent office encounter.  Keep 74-month visit and recheck echocardiogram just prior to that visit for follow-up of aortic stenosis.

## 2020-01-02 DIAGNOSIS — E039 Hypothyroidism, unspecified: Secondary | ICD-10-CM | POA: Diagnosis not present

## 2020-01-02 DIAGNOSIS — G4700 Insomnia, unspecified: Secondary | ICD-10-CM | POA: Diagnosis not present

## 2020-01-02 DIAGNOSIS — M19012 Primary osteoarthritis, left shoulder: Secondary | ICD-10-CM | POA: Diagnosis not present

## 2020-01-02 DIAGNOSIS — Z13220 Encounter for screening for lipoid disorders: Secondary | ICD-10-CM | POA: Diagnosis not present

## 2020-01-02 DIAGNOSIS — Z681 Body mass index (BMI) 19 or less, adult: Secondary | ICD-10-CM | POA: Diagnosis not present

## 2020-01-02 DIAGNOSIS — Z0001 Encounter for general adult medical examination with abnormal findings: Secondary | ICD-10-CM | POA: Diagnosis not present

## 2020-01-02 DIAGNOSIS — Z0000 Encounter for general adult medical examination without abnormal findings: Secondary | ICD-10-CM | POA: Diagnosis not present

## 2020-01-23 ENCOUNTER — Ambulatory Visit: Payer: Medicare Other | Admitting: Family Medicine

## 2020-02-06 DIAGNOSIS — E039 Hypothyroidism, unspecified: Secondary | ICD-10-CM | POA: Diagnosis not present

## 2020-02-14 ENCOUNTER — Emergency Department (HOSPITAL_COMMUNITY): Payer: Medicare Other

## 2020-02-14 ENCOUNTER — Emergency Department (HOSPITAL_COMMUNITY)
Admission: EM | Admit: 2020-02-14 | Discharge: 2020-02-14 | Disposition: A | Discharge status: Home / Self Care | Payer: Medicare Other | Attending: Emergency Medicine | Admitting: Emergency Medicine

## 2020-02-14 ENCOUNTER — Other Ambulatory Visit: Payer: Self-pay

## 2020-02-14 ENCOUNTER — Encounter (HOSPITAL_COMMUNITY): Payer: Self-pay

## 2020-02-14 DIAGNOSIS — E039 Hypothyroidism, unspecified: Secondary | ICD-10-CM | POA: Diagnosis not present

## 2020-02-14 DIAGNOSIS — R2242 Localized swelling, mass and lump, left lower limb: Secondary | ICD-10-CM | POA: Diagnosis present

## 2020-02-14 DIAGNOSIS — Z96642 Presence of left artificial hip joint: Secondary | ICD-10-CM | POA: Insufficient documentation

## 2020-02-14 DIAGNOSIS — Z743 Need for continuous supervision: Secondary | ICD-10-CM | POA: Diagnosis not present

## 2020-02-14 DIAGNOSIS — Z7982 Long term (current) use of aspirin: Secondary | ICD-10-CM | POA: Insufficient documentation

## 2020-02-14 DIAGNOSIS — L03116 Cellulitis of left lower limb: Secondary | ICD-10-CM

## 2020-02-14 DIAGNOSIS — I119 Hypertensive heart disease without heart failure: Secondary | ICD-10-CM | POA: Insufficient documentation

## 2020-02-14 DIAGNOSIS — I2510 Atherosclerotic heart disease of native coronary artery without angina pectoris: Secondary | ICD-10-CM | POA: Diagnosis not present

## 2020-02-14 DIAGNOSIS — R5381 Other malaise: Secondary | ICD-10-CM | POA: Diagnosis not present

## 2020-02-14 DIAGNOSIS — Z79899 Other long term (current) drug therapy: Secondary | ICD-10-CM | POA: Diagnosis not present

## 2020-02-14 DIAGNOSIS — M7989 Other specified soft tissue disorders: Secondary | ICD-10-CM | POA: Diagnosis not present

## 2020-02-14 DIAGNOSIS — Z96653 Presence of artificial knee joint, bilateral: Secondary | ICD-10-CM | POA: Diagnosis not present

## 2020-02-14 DIAGNOSIS — R609 Edema, unspecified: Secondary | ICD-10-CM | POA: Diagnosis not present

## 2020-02-14 DIAGNOSIS — Z87891 Personal history of nicotine dependence: Secondary | ICD-10-CM | POA: Insufficient documentation

## 2020-02-14 LAB — CBC WITH DIFFERENTIAL/PLATELET
Abs Immature Granulocytes: 0.03 K/uL (ref 0.00–0.07)
Basophils Absolute: 0.1 K/uL (ref 0.0–0.1)
Basophils Relative: 1 %
Eosinophils Absolute: 0.2 K/uL (ref 0.0–0.5)
Eosinophils Relative: 4 %
HCT: 39.7 % (ref 36.0–46.0)
Hemoglobin: 12.5 g/dL (ref 12.0–15.0)
Immature Granulocytes: 1 %
Lymphocytes Relative: 16 %
Lymphs Abs: 1.1 K/uL (ref 0.7–4.0)
MCH: 27.8 pg (ref 26.0–34.0)
MCHC: 31.5 g/dL (ref 30.0–36.0)
MCV: 88.2 fL (ref 80.0–100.0)
Monocytes Absolute: 0.6 K/uL (ref 0.1–1.0)
Monocytes Relative: 9 %
Neutro Abs: 4.6 K/uL (ref 1.7–7.7)
Neutrophils Relative %: 69 %
Platelets: 247 K/uL (ref 150–400)
RBC: 4.5 MIL/uL (ref 3.87–5.11)
RDW: 14.6 % (ref 11.5–15.5)
WBC: 6.6 K/uL (ref 4.0–10.5)
nRBC: 0 % (ref 0.0–0.2)

## 2020-02-14 LAB — BASIC METABOLIC PANEL
Anion gap: 9 (ref 5–15)
BUN: 20 mg/dL (ref 8–23)
CO2: 31 mmol/L (ref 22–32)
Calcium: 8.7 mg/dL — ABNORMAL LOW (ref 8.9–10.3)
Chloride: 102 mmol/L (ref 98–111)
Creatinine, Ser: 0.7 mg/dL (ref 0.44–1.00)
GFR calc Af Amer: >60 mL/min (ref >60)
GFR calc non Af Amer: >60 mL/min (ref >60)
Glucose, Bld: 78 mg/dL (ref 70–99)
Potassium: 3.5 mmol/L (ref 3.5–5.1)
Sodium: 142 mmol/L (ref 135–145)

## 2020-02-14 MED ORDER — DOXYCYCLINE HYCLATE 100 MG PO CAPS
100.0000 mg | ORAL_CAPSULE | Freq: Two times a day (BID) | ORAL | 0 refills | Status: DC
Start: 2020-02-14 — End: 2020-06-16

## 2020-02-14 MED ORDER — DOXYCYCLINE HYCLATE 100 MG PO TABS
100.0000 mg | ORAL_TABLET | Freq: Once | ORAL | Status: AC
  Administered 2020-02-14: 100 mg via ORAL
  Filled 2020-02-14: qty 1

## 2020-02-14 NOTE — Discharge Instructions (Signed)
Take the antibiotic doxycycline as directed for 7 days.  If you do not have any improvement follow-up with your doctor or return for follow-up.  Return for any new or worse symptoms.  X-rays and labs without significant abnormalities.

## 2020-02-14 NOTE — ED Triage Notes (Signed)
Pt brought in by [facility], Left foot swollen and red. Pt stated it started yesterday while she was in the yard. When she got up this morning it was larger and thinks she may have gotten bit by a bug.

## 2020-02-21 DIAGNOSIS — A46 Erysipelas: Secondary | ICD-10-CM | POA: Diagnosis not present

## 2020-02-21 DIAGNOSIS — M7989 Other specified soft tissue disorders: Secondary | ICD-10-CM | POA: Diagnosis not present

## 2020-02-25 ENCOUNTER — Ambulatory Visit: Payer: Medicare HMO | Admitting: Family Medicine

## 2020-03-18 NOTE — ED Provider Notes (Signed)
[facility] EMERGENCY DEPARTMENT Provider Note   CSN: [id number] Arrival date & time: 02/14/20  1124     History Chief Complaint  Patient presents with  . Leg Swelling    [name] is a 86 y.o. female.  Patient brought in by EMS.  Patient with left foot swollen red.  Patient stated started yesterday while she was in the yard when she got up this morning it was larger and thinks she may have gotten bitten by a bug.  Her temperature here is 98.1.  Respirations 16 heart rate 93.  Blood pressure is 110/96.  Oxygen saturation 96%.  Patient's past medical history significant for hypothyroidism hyperlipidemia chronic bronchitis.  Coronary artery disease and aortic stenosis.  Patient is on Synthroid.  Patient is not on any blood thinners.  Patient is on Lasix.  Patient denies any fevers.  Denies any systemic symptoms.        Past Medical History:  Diagnosis Date  . Anxiety   . Aortic stenosis   . Arthritis   . CAD (coronary artery disease)    Mild at cardiac catheterization 2018  . Chronic bronchitis   . Constipation   . Depression   . Hyperlipidemia   . Hypothyroidism     Patient Active Problem List   Diagnosis Date Noted  . Elevated LFTs 05/12/2017  . NSTEMI (non-ST elevated myocardial infarction) (HCC)   . Elevated liver enzymes   . Depression 02/12/2017  . Anxiety 02/12/2017  . Hyperbilirubinemia 02/12/2017  . Gallstones 02/12/2017  . Hypokalemia 02/12/2017  . Biliary colic   . Moderate aortic stenosis 08/31/2016  . Hypertensive cardiovascular disease 08/31/2016  . Chest pain with moderate risk of acute coronary syndrome 08/30/2016  . Uncontrolled hypertension 08/30/2016  . Hypothyroidism   . Hyperlipidemia   . S/P knee replacement 09/06/2012  . S/P reconstruction of ligament of knee 11/24/2011  . Patellar tendon rupture 06/04/2011  . S/P total knee replacement 05/13/2011  . KNEE, ARTHRITIS, DEGEN./OSTEO 05/14/2010    Past Surgical History:    Procedure Laterality Date  . CHOLECYSTECTOMY N/A 02/15/2017   Procedure: LAPAROSCOPIC CHOLECYSTECTOMY;  Surgeon: Axel Filler, MD;  Location: [facility] OR;  Service: General;  Laterality: N/A;  . CORONARY ANGIOGRAPHY N/A 02/14/2017   Procedure: Coronary Angiography;  Surgeon: Yvonne Kendall, MD;  Location: [facility] INVASIVE CV LAB;  Service: Cardiovascular;  Laterality: N/A;  . EYE SURGERY  2009   bilateral  . HEMATOMA EVACUATION  06/04/2011   Procedure: EVACUATION HEMATOMA;  Surgeon: Fuller Canada, MD;  Location: [facility] ORS;  Service: Orthopedics;  Laterality: Right;  evacuation of seroma  . JOINT REPLACEMENT  05/2010   left total knee Dr. Romeo Apple  . Left hip replacement @ [facility] '98    . Left total knee    . PATELLAR TENDON REPAIR  06/04/2011   Procedure: PATELLA TENDON REPAIR;  Surgeon: Fuller Canada, MD;  Location: [facility] ORS;  Service: Orthopedics;  Laterality: Right;  . PATELLAR TENDON REPAIR  08/30/2011   Procedure: PATELLA TENDON REPAIR;  Surgeon: Fuller Canada, MD;  Location: [facility] ORS;  Service: Orthopedics;  Laterality: Right;  Allograft Reconstrucation Right Patella Tendon  . REPLACEMENT TOTAL KNEE BILATERAL    . TOTAL HIP ARTHROPLASTY    . TOTAL KNEE ARTHROPLASTY  05/03/2011   Procedure: TOTAL KNEE ARTHROPLASTY;  Surgeon: Fuller Canada, MD;  Location: [facility] ORS;  Service: Orthopedics;  Laterality: Right;  With DePuy  . TOTAL THYROIDECTOMY  March 2011   Dr. Suszanne Conners @ [facility]  . TUBAL  LIGATION       OB History   No obstetric history on file.     Family History  Problem Relation Age of Onset  . Colon cancer Mother   . Blindness Sister   . Anesthesia problems Neg Hx   . Hypotension Neg Hx   . Malignant hyperthermia Neg Hx   . Pseudochol deficiency Neg Hx     Social History   Tobacco Use  . Smoking status: Former Smoker    Types: Cigarettes    Quit date: 09/27/1978    Years since quitting: 41.5  . Smokeless tobacco: Never Used  Vaping Use  . Vaping Use: Never used  Substance Use  Topics  . Alcohol use: No  . Drug use: No    Home Medications Prior to Admission medications   Medication Sig Start Date End Date Taking? Authorizing Provider  ALPRAZolam Prudy Feeler) 0.25 MG tablet Take 1 tablet by mouth daily.  01/17/17  Yes [provider]  aspirin EC 81 MG tablet Take 81 mg by mouth daily.   Yes [provider]  furosemide (LASIX) 20 MG tablet Take 1 tablet by mouth daily. 05/09/17  Yes [provider]  ibuprofen (ADVIL,MOTRIN) 200 MG tablet Take 600 mg by mouth daily as needed for moderate pain.   Yes [provider]  levothyroxine (SYNTHROID, LEVOTHROID) 150 MCG tablet Take 137 mcg by mouth every morning.    Yes [provider]  MYRBETRIQ 50 MG TB24 tablet Take 50 mg by mouth daily. 01/29/20  Yes [provider]  PARoxetine (PAXIL) 20 MG tablet Take 20 mg by mouth every evening.     Yes [provider]  cholecalciferol (VITAMIN D3) 25 MCG (1000 UT) tablet Take 1,000 Units by mouth daily.    [provider]  doxycycline (VIBRAMYCIN) 100 MG capsule Take 1 capsule (100 mg total) by mouth 2 (two) times daily. 02/14/20   Vanetta Mulders, MD  TOVIAZ 8 MG TB24 tablet Take 1 tablet (8 mg total) by mouth daily. Patient not taking: Reported on 02/14/2020 11/15/19   Ellsworth Lennox, PA-C    Allergies    Patient has no known allergies.  Review of Systems   Review of Systems  Constitutional: Negative for chills and fever.  HENT: Negative for congestion, rhinorrhea and sore throat.   Eyes: Negative for visual disturbance.  Respiratory: Negative for cough and shortness of breath.   Cardiovascular: Negative for chest pain and leg swelling.  Gastrointestinal: Negative for abdominal pain, diarrhea, nausea and vomiting.  Genitourinary: Negative for dysuria.  Musculoskeletal: Negative for back pain, joint swelling and neck pain.  Skin: Negative for rash.  Neurological: Negative for dizziness, light-headedness and  headaches.  Hematological: Does not bruise/bleed easily.  Psychiatric/Behavioral: Negative for confusion.    Physical Exam Updated Vital Signs BP (!) 157/75 (BP Location: Right Arm)   Pulse 65   Temp 98.1 F (36.7 C) (Oral)   Resp 16   Ht 1.753 m (5\' 9" )   Wt 90.7 kg   SpO2 99%   BMI 29.53 kg/m   Physical Exam Vitals and nursing note reviewed.  Constitutional:      General: She is not in acute distress.    Appearance: Normal appearance. She is well-developed.  HENT:     Head: Normocephalic and atraumatic.  Eyes:     Extraocular Movements: Extraocular movements intact.     Conjunctiva/sclera: Conjunctivae normal.     Pupils: Pupils are equal, round, and reactive to light.  Cardiovascular:     Rate and Rhythm: Normal rate and regular rhythm.     Heart sounds: No murmur heard.   Pulmonary:     Effort: Pulmonary effort is normal. No respiratory distress.     Breath sounds: Normal breath sounds.  Abdominal:     Palpations: Abdomen is soft.     Tenderness: There is no abdominal tenderness.  Musculoskeletal:        General: Swelling present.     Cervical back: Neck supple.     Comments: Swelling and redness to the left foot.  Has good cap refill.  Dorsalis pedis pulse 1+.  Good movement of toes sensation intact.  The erythema encompasses the left foot and somewhat of the left ankle.  No evidence of any puncture wounds.  No fluctuance or evidence of any abscess.  Skin:    General: Skin is warm and dry.     Capillary Refill: Capillary refill takes less than 2 seconds.  Neurological:     General: No focal deficit present.     Mental Status: She is alert and oriented to person, place, and time.     ED Results / Procedures / Treatments   Labs (all labs ordered are listed, but only abnormal results are displayed) Labs Reviewed  BASIC METABOLIC PANEL - Abnormal; Notable for the following components:      Result Value   Calcium 8.7 (*)    All other components within normal  limits  CBC WITH DIFFERENTIAL/PLATELET    EKG None  Radiology No results found.   X-ray of left foot showed soft tissue swelling postoperative changes in the first metatarsal.  Narrowing medial distal joints bones are osteoporotic no fracture or dislocation no evidence of osteomyelitis. X-ray of the left ankle also without any significant changes in the soft tissue swelling.   Procedures Procedures (including critical care time)  Medications Ordered in ED Medications  doxycycline (VIBRA-TABS) tablet 100 mg (100 mg Oral Given 02/14/20 1553)    ED Course  I have reviewed the triage vital signs and the nursing notes.  Pertinent labs & imaging results that were available during my care of the patient were reviewed by me and considered in my medical decision making (see chart for details).    MDM Rules/Calculators/A&P                          Patient's labs without any significant abnormalities.  No leukocytosis.  Patient without any fever.  Patient will be treated for cellulitis of the left foot ankle area.  With doxycycline.  Patient stable for discharge home.   Final Clinical Impression(s) / ED Diagnoses Final diagnoses:  Cellulitis of left lower extremity    Rx / DC Orders ED Discharge Orders         Ordered    doxycycline (VIBRAMYCIN) 100 MG capsule  2 times daily     Discontinue  Reprint     02/14/20 1546           Fredia Sorrow, MD 03/18/20 [phone]

## 2020-04-17 DIAGNOSIS — N390 Urinary tract infection, site not specified: Secondary | ICD-10-CM | POA: Diagnosis not present

## 2020-06-16 ENCOUNTER — Encounter: Payer: Self-pay | Admitting: Cardiology

## 2020-06-16 ENCOUNTER — Telehealth (INDEPENDENT_AMBULATORY_CARE_PROVIDER_SITE_OTHER): Payer: Medicare Other | Admitting: Cardiology

## 2020-06-16 ENCOUNTER — Telehealth: Payer: Self-pay | Admitting: *Deleted

## 2020-06-16 ENCOUNTER — Other Ambulatory Visit: Payer: Self-pay

## 2020-06-16 ENCOUNTER — Ambulatory Visit (HOSPITAL_COMMUNITY)
Admission: RE | Admit: 2020-06-16 | Discharge: 2020-06-16 | Disposition: A | Discharge status: Home / Self Care | Payer: Medicare Other | Source: Ambulatory Visit | Attending: Cardiology | Admitting: Cardiology

## 2020-06-16 VITALS — BP 168/70 | Ht 65.0 in

## 2020-06-16 DIAGNOSIS — I25119 Atherosclerotic heart disease of native coronary artery with unspecified angina pectoris: Secondary | ICD-10-CM

## 2020-06-16 DIAGNOSIS — I350 Nonrheumatic aortic (valve) stenosis: Secondary | ICD-10-CM

## 2020-06-16 LAB — ECHOCARDIOGRAM COMPLETE
AR max vel: 0.75 cm2
AV Area VTI: 0.77 cm2
AV Area mean vel: 0.82 cm2
AV Mean grad: 42 mmHg
AV Peak grad: 72.9 mmHg
Ao pk vel: 4.27 m/s
Area-P 1/2: 2.05 cm2
S' Lateral: 2 cm

## 2020-06-16 NOTE — Progress Notes (Signed)
Virtual Visit via Telephone Note   This visit type was conducted due to national recommendations for restrictions regarding the COVID-19 Pandemic (e.g. social distancing) in an effort to limit this patient's exposure and mitigate transmission in our community.  Due to her co-morbid illnesses, this patient is at least at moderate risk for complications without adequate follow up.  This format is felt to be most appropriate for this patient at this time.  The patient did not have access to video technology/had technical difficulties with video requiring transitioning to audio format only (telephone).  All issues noted in this document were discussed and addressed.  No physical exam could be performed with this format.  Please refer to the patient's chart for her  consent to telehealth for [facility].    Date:  06/16/2020   ID:  [name], DOB 1932-10-23, MRN [id number] The patient was identified using 2 identifiers.  Patient Location: Home Provider Location: Office/Clinic  PCP:  Shawnie Dapper, PA-C  Cardiologist:  Nona Dell, MD Electrophysiologist:  None   Evaluation Performed:  Follow-Up Visit  Chief Complaint:   Cardiac follow-up  History of Present Illness:    [name] is a 86 y.o. female last seen in March.  We spoke by phone today.  She tells me that she is doing reasonably well, still functional with ADLs, does have to rest and take a nap from time to time.  No angina or syncope.  Echocardiogram in March revealed LVEF greater than 75% with moderate LVH and mild diastolic dysfunction, moderate left atrial enlargement, and heavily calcified aortic valve with moderate to severe (upper end) stenosis, mean gradient 42 mmHg, dimensionless index 0.39.  Follow-up echocardiogram was done earlier this morning, results pending.  I reviewed her medications which are outlined below and stable.  She is following at [facility] for primary care.   Past Medical  History:  Diagnosis Date  . Anxiety   . Aortic stenosis   . Arthritis   . CAD (coronary artery disease)    Mild at cardiac catheterization 2018  . Chronic bronchitis   . Constipation   . Depression   . Hyperlipidemia   . Hypothyroidism    Past Surgical History:  Procedure Laterality Date  . CHOLECYSTECTOMY N/A 02/15/2017   Procedure: LAPAROSCOPIC CHOLECYSTECTOMY;  Surgeon: Axel Filler, MD;  Location: [facility] OR;  Service: General;  Laterality: N/A;  . CORONARY ANGIOGRAPHY N/A 02/14/2017   Procedure: Coronary Angiography;  Surgeon: Yvonne Kendall, MD;  Location: [facility] INVASIVE CV LAB;  Service: Cardiovascular;  Laterality: N/A;  . EYE SURGERY  2009   bilateral  . HEMATOMA EVACUATION  06/04/2011   Procedure: EVACUATION HEMATOMA;  Surgeon: Fuller Canada, MD;  Location: [facility] ORS;  Service: Orthopedics;  Laterality: Right;  evacuation of seroma  . JOINT REPLACEMENT  05/2010   left total knee Dr. Romeo Apple  . Left hip replacement @ [facility] '98    . Left total knee    . PATELLAR TENDON REPAIR  06/04/2011   Procedure: PATELLA TENDON REPAIR;  Surgeon: Fuller Canada, MD;  Location: [facility] ORS;  Service: Orthopedics;  Laterality: Right;  . PATELLAR TENDON REPAIR  08/30/2011   Procedure: PATELLA TENDON REPAIR;  Surgeon: Fuller Canada, MD;  Location: [facility] ORS;  Service: Orthopedics;  Laterality: Right;  Allograft Reconstrucation Right Patella Tendon  . REPLACEMENT TOTAL KNEE BILATERAL    . TOTAL HIP ARTHROPLASTY    . TOTAL KNEE ARTHROPLASTY  05/03/2011   Procedure: TOTAL KNEE ARTHROPLASTY;  Surgeon:  Fuller Canada, MD;  Location: [facility] ORS;  Service: Orthopedics;  Laterality: Right;  With DePuy  . TOTAL THYROIDECTOMY  March 2011   Dr. Suszanne Conners @ [facility]  . TUBAL LIGATION       Current Meds  Medication Sig  . ALPRAZolam (XANAX) 0.25 MG tablet Take 1 tablet by mouth daily.   Marland Kitchen aspirin EC 81 MG tablet Take 81 mg by mouth daily.  . cholecalciferol (VITAMIN D3) 25 MCG (1000 UT) tablet Take 1,000 Units by mouth  daily.  . furosemide (LASIX) 20 MG tablet Take 1 tablet by mouth daily.  Marland Kitchen ibuprofen (ADVIL,MOTRIN) 200 MG tablet Take 600 mg by mouth daily as needed for moderate pain.  Marland Kitchen levothyroxine (SYNTHROID, LEVOTHROID) 150 MCG tablet Take 137 mcg by mouth every morning.   Marland Kitchen PARoxetine (PAXIL) 20 MG tablet Take 20 mg by mouth every evening.    . TOVIAZ 8 MG TB24 tablet Take 1 tablet (8 mg total) by mouth daily.     Allergies:   Patient has no known allergies.   ROS:   No palpitations.  Prior CV studies:   The following studies were reviewed today:  Echocardiogram 12/03/2019: 1. Left ventricular ejection fraction, by estimation, is >75%. The left  ventricle has hyperdynamic function. The left ventricle has no regional  wall motion abnormalities. There is moderate left ventricular hypertrophy.  Left ventricular diastolic  parameters are consistent with Grade I diastolic dysfunction (impaired  relaxation).  2. Right ventricular systolic function is normal. The right ventricular  size is normal. Tricuspid regurgitation signal is inadequate for assessing  PA pressure.  3. Left atrial size was moderately dilated.  4. The mitral valve is degenerative. Trivial mitral valve regurgitation.  5. The aortic valve has an indeterminant number of cusps and is heavily  calcified with reduced cusp excursion. Aortic valve regurgitation is not  visualized. Moderate to severe aortic valve stenosis, severe based on  gradients and morphology, moderate by  valve area and dimentionless index.  6. The inferior vena cava is normal in size with greater than 50%  respiratory variability, suggesting right atrial pressure of 3 mmHg.   Labs/Other Tests and Data Reviewed:    EKG:  An ECG dated 11/27/2019 was personally reviewed today and demonstrated:  Ectopic atrial rhythm with increased voltage and repolarization abnormalities.  Recent Labs: 02/14/2020: BUN 20; Creatinine, Ser 0.70; Hemoglobin 12.5; Platelets 247;  Potassium 3.5; Sodium 142    Wt Readings from Last 3 Encounters:  02/14/20 200 lb (90.7 kg)  08/16/18 243 lb (110.2 kg)  02/16/17 250 lb 4.8 oz (113.5 kg)     Objective:    Vital Signs:  BP (!) 168/70   Ht 5\' 5"  (1.651 m)   BMI 33.28 kg/m    Patient spoke in full sentences, not short of breath on the phone.  ASSESSMENT & PLAN:    1.  Moderate to severe calcific aortic stenosis, follow-up echocardiogram pending from this morning for reassessment.  She remains functional with ADLs, reasonably active at age 37.  Would be reasonable candidate for TAVR evaluation.  2.  History of nonobstructive CAD, no active angina at this time.  She continues on aspirin.  3.  Elevated blood pressure, following with [facility] for PCP at this point.  Currently not on antihypertensive regimen.  Time:   Today, I have spent 6 minutes with the patient with telehealth technology discussing the above problems.     Medication Adjustments/Labs and Tests Ordered: Current medicines are reviewed at length  with the patient today.  Concerns regarding medicines are outlined above.   Tests Ordered: No orders of the defined types were placed in this encounter.   Medication Changes: No orders of the defined types were placed in this encounter.   Follow Up:  In Person 6 months in the [place] office.  Signed, Nona Dell, MD  06/16/2020 11:42 AM    [facility]

## 2020-06-16 NOTE — Patient Instructions (Signed)

## 2020-06-16 NOTE — Telephone Encounter (Signed)
Patient informed and verbalized understanding of plan. Copy sent to PCP 

## 2020-06-16 NOTE — Progress Notes (Signed)
*  PRELIMINARY RESULTS* Echocardiogram 2D Echocardiogram has been performed.  Stacey Drain 06/16/2020, 10:16 AM

## 2020-06-16 NOTE — Telephone Encounter (Signed)
-----   Message from Jonelle Sidle, MD sent at 06/16/2020 11:46 AM EDT ----- Results reviewed.  Please let her know that I had the opportunity to review her echocardiogram after we spoke today for her visit.  Aortic stenosis is more clearly in the severe range at this point, and I do think that having a TAVR evaluation would be reasonable (we discussed this during her visit).  Please have her set up for a valve clinic consultation in [place].

## 2020-06-23 ENCOUNTER — Encounter: Payer: Self-pay | Admitting: *Deleted

## 2020-06-23 ENCOUNTER — Other Ambulatory Visit: Payer: Self-pay

## 2020-06-23 ENCOUNTER — Ambulatory Visit (INDEPENDENT_AMBULATORY_CARE_PROVIDER_SITE_OTHER): Payer: Medicare Other | Admitting: Cardiovascular Disease

## 2020-06-23 ENCOUNTER — Encounter: Payer: Self-pay | Admitting: Cardiovascular Disease

## 2020-06-23 VITALS — BP 150/86 | HR 62 | Ht 65.0 in | Wt 242.5 lb

## 2020-06-23 DIAGNOSIS — I350 Nonrheumatic aortic (valve) stenosis: Secondary | ICD-10-CM | POA: Diagnosis not present

## 2020-06-23 NOTE — Patient Instructions (Signed)
Medication Instructions:  No changes *If you need a refill on your cardiac medications before your next appointment, please call your pharmacy*   Lab Work: Today: CBC, BMET  If you have labs (blood work) drawn today and your tests are completely normal, you will receive your results only by: Marland Kitchen MyChart Message (if you have MyChart) OR . A paper copy in the mail If you have any lab test that is abnormal or we need to change your treatment, we will call you to review the results.   Testing/Procedures: Your physician has requested that you have a cardiac catheterization. Cardiac catheterization is used to diagnose and/or treat various heart conditions. Doctors may recommend this procedure for a number of different reasons. The most common reason is to evaluate chest pain. Chest pain can be a symptom of coronary artery disease (CAD), and cardiac catheterization can show whether plaque is narrowing or blocking your heart's arteries. This procedure is also used to evaluate the valves, as well as measure the blood flow and oxygen levels in different parts of your heart. For further information please visit [email or web address] Please follow instruction sheet, as given.   Other Instructions Julieta Gutting, RN Structural Heart Nurse Navigator will be in touch regarding the next steps for your procedure.

## 2020-06-23 NOTE — H&P (View-Only) (Signed)
 Structural Heart Clinic Consult Note  Chief Complaint  Patient presents with  . Follow-up    Severe aortic stenosis    History of Present Illness: 86 yo female with history of arthritis, CAD, chronic bronchitis, anxiety/depression, hyperlipidemia, hypothyroidism and severe aortic stenosis who is here today as a new consult, referred by Dr. McDowell, for further discussion regarding her aortic stenosis. She has been followed for moderate aortic stenosis. Most recent echo in September 2021 with normal LV size and function with LVEF=70%. Moderate LVH. The aortic valve leaflets are thickened and calcified with limited leaflet excursion. Mean gradient 42 mmHg, peak gradient 72.9 mmHg, AVA 0.75 cm2, dimensionless index 0.30. She had a cardiac cath in 2018 and was found to have mild CAD at that time.   She tells me today that she is having dizzy spells that last 10-15 seconds several times per day. She has progressive fatigue. She has had no recent lower extremity edema, dyspnea or chest pain.  She lives alone with her dog and two cats in [place], . She is retired from office work. She has no teeth. She does not have dentures. She does not ambulate. She is in a wheelchair at home. She does her own cooking and cleaning from her wheelchair.   Primary Care Physician: Golding, John, MD Primary Cardiologist: McDowell Referring Cardiologist: McDowell  Past Medical History:  Diagnosis Date  . Anxiety   . Aortic stenosis   . Arthritis   . CAD (coronary artery disease)    Mild at cardiac catheterization 2018  . Chronic bronchitis   . Constipation   . Depression   . Hyperlipidemia   . Hypothyroidism     Past Surgical History:  Procedure Laterality Date  . CHOLECYSTECTOMY N/A 02/15/2017   Procedure: LAPAROSCOPIC CHOLECYSTECTOMY;  Surgeon: Ramirez, Armando, MD;  Location: [facility] OR;  Service: General;  Laterality: N/A;  . CORONARY ANGIOGRAPHY N/A 02/14/2017   Procedure: Coronary Angiography;   Surgeon: End, Ariauna Farabee, MD;  Location: [facility] INVASIVE CV LAB;  Service: Cardiovascular;  Laterality: N/A;  . EYE SURGERY  2009   bilateral  . HEMATOMA EVACUATION  06/04/2011   Procedure: EVACUATION HEMATOMA;  Surgeon: Stanley Harrison, MD;  Location: [facility] ORS;  Service: Orthopedics;  Laterality: Right;  evacuation of seroma  . JOINT REPLACEMENT  05/2010   left total knee Dr. Harrison  . Left hip replacement @ [facility] '98    . Left total knee    . PATELLAR TENDON REPAIR  06/04/2011   Procedure: PATELLA TENDON REPAIR;  Surgeon: Stanley Harrison, MD;  Location: [facility] ORS;  Service: Orthopedics;  Laterality: Right;  . PATELLAR TENDON REPAIR  08/30/2011   Procedure: PATELLA TENDON REPAIR;  Surgeon: Stanley Harrison, MD;  Location: [facility] ORS;  Service: Orthopedics;  Laterality: Right;  Allograft Reconstrucation Right Patella Tendon  . REPLACEMENT TOTAL KNEE BILATERAL    . TOTAL HIP ARTHROPLASTY    . TOTAL KNEE ARTHROPLASTY  05/03/2011   Procedure: TOTAL KNEE ARTHROPLASTY;  Surgeon: Stanley Harrison, MD;  Location: [facility] ORS;  Service: Orthopedics;  Laterality: Right;  With DePuy  . TOTAL THYROIDECTOMY  March 2011   Dr. Teoh @ [facility]  . TUBAL LIGATION      Current Outpatient Medications  Medication Sig Dispense Refill  . ALPRAZolam (XANAX) 0.25 MG tablet Take 1 tablet by mouth daily.   4  . aspirin EC 81 MG tablet Take 81 mg by mouth daily.    . cholecalciferol (VITAMIN D3) 25 MCG (1000 UT) tablet Take 1,000   Units by mouth daily.    . furosemide (LASIX) 20 MG tablet Take 1 tablet by mouth daily.  11  . ibuprofen (ADVIL,MOTRIN) 200 MG tablet Take 600 mg by mouth daily as needed for moderate pain.    Marland Kitchen levothyroxine (SYNTHROID, LEVOTHROID) 150 MCG tablet Take 137 mcg by mouth every morning.     Marland Kitchen PARoxetine (PAXIL) 20 MG tablet Take 20 mg by mouth every evening.      . TOVIAZ 8 MG TB24 tablet Take 1 tablet (8 mg total) by mouth daily. 30 tablet 0   No current facility-administered medications for this visit.     No Known Allergies  Social History   Socioeconomic History  . Marital status: Widowed    Spouse name: Not on file  . Number of children: 4  . Years of education: Not on file  . Highest education level: Not on file  Occupational History  . Occupation: Retired-Office work  Tobacco Use  . Smoking status: Former Smoker    Types: Cigarettes    Quit date: 09/27/1978    Years since quitting: 41.7  . Smokeless tobacco: Never Used  Vaping Use  . Vaping Use: Never used  Substance and Sexual Activity  . Alcohol use: No  . Drug use: No  . Sexual activity: Never  Other Topics Concern  . Not on file  Social History Narrative   ** Merged History Encounter **       Social Determinants of Health   Financial Resource Strain:   . Difficulty of Paying Living Expenses: Not on file  Food Insecurity:   . Worried About Programme researcher, broadcasting/film/video in the Last Year: Not on file  . Ran Out of Food in the Last Year: Not on file  Transportation Needs:   . Lack of Transportation (Medical): Not on file  . Lack of Transportation (Non-Medical): Not on file  Physical Activity:   . Days of Exercise per Week: Not on file  . Minutes of Exercise per Session: Not on file  Stress:   . Feeling of Stress : Not on file  Social Connections:   . Frequency of Communication with Friends and Family: Not on file  . Frequency of Social Gatherings with Friends and Family: Not on file  . Attends Religious Services: Not on file  . Active Member of Clubs or Organizations: Not on file  . Attends Banker Meetings: Not on file  . Marital Status: Not on file  Intimate Partner Violence:   . Fear of Current or Ex-Partner: Not on file  . Emotionally Abused: Not on file  . Physically Abused: Not on file  . Sexually Abused: Not on file    Family History  Problem Relation Age of Onset  . Colon cancer Mother   . Blindness Sister   . Anesthesia problems Neg Hx   . Hypotension Neg Hx   . Malignant  hyperthermia Neg Hx   . Pseudochol deficiency Neg Hx     Review of Systems:  As stated in the HPI and otherwise negative.   BP (!) 150/86   Pulse 62   Ht 5\' 5"  (1.651 m)   SpO2 96%   BMI 33.28 kg/m   Physical Examination: General: Well developed, well nourished, NAD  HEENT: OP clear, mucus membranes moist  SKIN: warm, dry. No rashes. Neuro: No focal deficits  Musculoskeletal: Muscle strength 5/5 all ext  Psychiatric: Mood and affect normal  Neck: No JVD, no carotid bruits, no thyromegaly,  no lymphadenopathy.  Lungs:Clear bilaterally, no wheezes, rhonci, crackles Cardiovascular: Regular rate and rhythm. Loud, harsh, late peaking systolic murmur.  Abdomen:Soft. Bowel sounds present. Non-tender.  Extremities:  No lower extremity edema. Pulses are 2 + in the bilateral DP/PT.  EKG:  EKG is ordered today. The ekg ordered today demonstrates Sinus bradycardia, rate 59 bpm. Old inferior Q waves.   Echo 06/16/20: 1. Left ventricular ejection fraction, by estimation, is 65 to 70%. The  left ventricle has normal function. The left ventricle has no regional  wall motion abnormalities. There is moderate left ventricular hypertrophy.  Left ventricular diastolic  parameters are consistent with Grade I diastolic dysfunction (impaired  relaxation).  2. Right ventricular systolic function is normal. The right ventricular  size is normal. There is normal pulmonary artery systolic pressure. The  estimated right ventricular systolic pressure is 34.6 mmHg.  3. Left atrial size was severely dilated.  4. Right atrial size was upper normal.  5. The mitral valve is abnormal. Trivial mitral valve regurgitation.  Moderate mitral annular calcification.  6. The aortic valve has an indeterminant number of cusps. There is severe  calcifcation of the aortic valve. Aortic valve regurgitation is trivial.  Severe aortic valve stenosis. Aortic valve mean gradient measures 42.0  mmHg. Aortic valve Vmax  measures  4.27 m/s. Dimentionless index 0.30.  7. The inferior vena cava is normal in size with greater than 50%  respiratory variability, suggesting right atrial pressure of 3 mmHg.   FINDINGS  Left Ventricle: Left ventricular ejection fraction, by estimation, is 65  to 70%. The left ventricle has normal function. The left ventricle has no  regional wall motion abnormalities. The left ventricular internal cavity  size was normal in size. There is  moderate left ventricular hypertrophy. Left ventricular diastolic  parameters are consistent with Grade I diastolic dysfunction (impaired  relaxation).   Right Ventricle: The right ventricular size is normal. No increase in  right ventricular wall thickness. Right ventricular systolic function is  normal. There is normal pulmonary artery systolic pressure. The tricuspid  regurgitant velocity is 2.81 m/s, and  with an assumed right atrial pressure of 3 mmHg, the estimated right  ventricular systolic pressure is 34.6 mmHg.   Left Atrium: Left atrial size was severely dilated.   Right Atrium: Right atrial size was upper normal.   Pericardium: There is no evidence of pericardial effusion.   Mitral Valve: The mitral valve is abnormal. Moderate mitral annular  calcification. Trivial mitral valve regurgitation.   Tricuspid Valve: The tricuspid valve is grossly normal. Tricuspid valve  regurgitation is mild.   Aortic Valve: The aortic valve has an indeterminant number of cusps. There  is severe calcifcation of the aortic valve. There is moderate aortic valve  annular calcification. Aortic valve regurgitation is trivial. Severe  aortic stenosis is present. Aortic  valve mean gradient measures 42.0 mmHg. Aortic valve peak gradient  measures 72.9 mmHg. Aortic valve area, by VTI measures 0.77 cm.   Pulmonic Valve: The pulmonic valve was grossly normal. Pulmonic valve  regurgitation is trivial.   Aorta: The aortic root is normal in size  and structure.   Venous: The inferior vena cava is normal in size with greater than 50%  respiratory variability, suggesting right atrial pressure of 3 mmHg.   IAS/Shunts: No atrial level shunt detected by color flow Doppler.     LEFT VENTRICLE  PLAX 2D  LVIDd:     3.01 cm Diastology  LVIDs:     2.00  cm LV e' medial:  5.55 cm/s  LV PW:     1.29 cm LV E/e' medial: 16.9  LV IVS:    1.87 cm LV e' lateral:  6.42 cm/s  LVOT diam:   1.80 cm LV E/e' lateral: 14.6  LV SV:     90  LV SV Index:  43  LVOT Area:   2.54 cm     RIGHT VENTRICLE  RV S prime:   9.79 cm/s  TAPSE (M-mode): 2.5 cm   LEFT ATRIUM       Index    RIGHT ATRIUM      Index  LA diam:    2.40 cm 1.16 cm/m RA Area:   20.80 cm  LA Vol (A2C):  84.1 ml 40.71 ml/m RA Volume:  70.20 ml 33.98 ml/m  LA Vol (A4C):  117.0 ml 56.63 ml/m  LA Biplane Vol: 100.0 ml 48.40 ml/m  AORTIC VALVE  AV Area (Vmax):  0.75 cm  AV Area (Vmean):  0.82 cm  AV Area (VTI):   0.77 cm  AV Vmax:      427.00 cm/s  AV Vmean:     302.000 cm/s  AV VTI:      1.170 m  AV Peak Grad:   72.9 mmHg  AV Mean Grad:   42.0 mmHg  LVOT Vmax:     126.00 cm/s  LVOT Vmean:    97.400 cm/s  LVOT VTI:     0.352 m  LVOT/AV VTI ratio: 0.30    AORTA  Ao Root diam: 3.10 cm   MITRAL VALVE        TRICUSPID VALVE  MV Area (PHT): 2.05 cm   TR Peak grad:  31.6 mmHg  MV Decel Time: 370 msec   TR Vmax:    281.00 cm/s  MV E velocity: 93.60 cm/s  MV A velocity: 111.00 cm/s SHUNTS  MV E/A ratio: 0.84     Systemic VTI: 0.35 m               Systemic Diam: 1.80 cm   Recent Labs: 02/14/2020: BUN 20; Creatinine, Ser 0.70; Hemoglobin 12.5; Platelets 247; Potassium 3.5; Sodium 142   Lipid Panel No results found for: CHOL, TRIG, HDL, CHOLHDL, VLDL, LDLCALC, LDLDIRECT   Wt Readings from Last 3 Encounters:  02/14/20 200 lb  (90.7 kg)  08/16/18 243 lb (110.2 kg)  02/16/17 250 lb 4.8 oz (113.5 kg)     Other studies Reviewed: Additional studies/ records that were reviewed today include: echo images, EKG, office notes.  Review of the above records demonstrates: Severe AS   Assessment and Plan:   1. Severe Aortic Valve Stenosis: She has severe, stage D aortic valve stenosis. I have personally reviewed the echo images. The aortic valve is thickened, calcified with limited leaflet mobility. I think she would benefit from AVR. Given advanced age, she is not a good candidate for conventional AVR by surgical approach. I think she may be a good candidate for TAVR. She is confined to a wheelchair due to chronic knee issues. She does not ambulate.   STS Risk Score: Risk of Mortality: 2.012% Renal Failure: 1.844% Permanent Stroke: 1.209% Prolonged Ventilation: 7.751% DSW Infection: 0.153% Reoperation: 3.011% Morbidity or Mortality: 12.665% Short Length of Stay: 23.554% Long Length of Stay: 6.781%  I have reviewed the natural history of aortic stenosis with the patient and their family members  who are present today. We have discussed the limitations of medical therapy and the poor prognosis associated  with symptomatic aortic stenosis. We have reviewed potential treatment options, including palliative medical therapy, conventional surgical aortic valve replacement, and transcatheter aortic valve replacement. We discussed treatment options in the context of the patient's specific comorbid medical conditions.   She would like to proceed with planning for TAVR. I will arrange a right and left heart catheterization at [facility] 07/04/20. Right groin for access. Risks and benefits of the cath procedure and the valve procedure are reviewed with the patient. After the cath, she will have a cardiac CT, CTA of the chest/abdomen and pelvis, carotid artery dopplers, PT assessment and will then be referred to see one of the CT  surgeons on our TAVR team.   Current medicines are reviewed at length with the patient today.  The patient does not have concerns regarding medicines.  The following changes have been made:  no change  Labs/ tests ordered today include:   Orders Placed This Encounter  Procedures  . CBC  . Basic metabolic panel  . EKG 12-Lead     Disposition:   FU with the valve team.    Signed, Verne Carrow, MD 06/23/2020 2:39 PM    [facility] [address] [place], [place], [place]  [address] Phone: [phone]; Fax: [phone]

## 2020-06-23 NOTE — Progress Notes (Signed)
[facility] Consult Note  Chief Complaint  Patient presents with  . Follow-up    Severe aortic stenosis    History of Present Illness: 86 yo female with history of arthritis, CAD, chronic bronchitis, anxiety/depression, hyperlipidemia, hypothyroidism and severe aortic stenosis who is here today as a new consult, referred by Dr. Diona Browner, for further discussion regarding her aortic stenosis. She has been followed for moderate aortic stenosis. Most recent echo in September 2021 with normal LV size and function with LVEF=70%. Moderate LVH. The aortic valve leaflets are thickened and calcified with limited leaflet excursion. Mean gradient 42 mmHg, peak gradient 72.9 mmHg, AVA 0.75 cm2, dimensionless index 0.30. She had a cardiac cath in 2018 and was found to have mild CAD at that time.   She tells me today that she is having dizzy spells that last 10-15 seconds several times per day. She has progressive fatigue. She has had no recent lower extremity edema, dyspnea or chest pain.  She lives alone with her dog and two cats in [place], [place]. She is retired from office work. She has no teeth. She does not have dentures. She does not ambulate. She is in a wheelchair at home. She does her own cooking and cleaning from her wheelchair.   Primary Care Physician: Assunta Found, MD Primary Cardiologist: Diona Browner Referring Cardiologist: Diona Browner  Past Medical History:  Diagnosis Date  . Anxiety   . Aortic stenosis   . Arthritis   . CAD (coronary artery disease)    Mild at cardiac catheterization 2018  . Chronic bronchitis   . Constipation   . Depression   . Hyperlipidemia   . Hypothyroidism     Past Surgical History:  Procedure Laterality Date  . CHOLECYSTECTOMY N/A 02/15/2017   Procedure: LAPAROSCOPIC CHOLECYSTECTOMY;  Surgeon: Axel Filler, MD;  Location: [facility] OR;  Service: General;  Laterality: N/A;  . CORONARY ANGIOGRAPHY N/A 02/14/2017   Procedure: Coronary Angiography;   Surgeon: Yvonne Kendall, MD;  Location: [facility] INVASIVE CV LAB;  Service: Cardiovascular;  Laterality: N/A;  . EYE SURGERY  2009   bilateral  . HEMATOMA EVACUATION  06/04/2011   Procedure: EVACUATION HEMATOMA;  Surgeon: Fuller Canada, MD;  Location: [facility] ORS;  Service: Orthopedics;  Laterality: Right;  evacuation of seroma  . JOINT REPLACEMENT  05/2010   left total knee Dr. Romeo Apple  . Left hip replacement @ [facility] '98    . Left total knee    . PATELLAR TENDON REPAIR  06/04/2011   Procedure: PATELLA TENDON REPAIR;  Surgeon: Fuller Canada, MD;  Location: [facility] ORS;  Service: Orthopedics;  Laterality: Right;  . PATELLAR TENDON REPAIR  08/30/2011   Procedure: PATELLA TENDON REPAIR;  Surgeon: Fuller Canada, MD;  Location: [facility] ORS;  Service: Orthopedics;  Laterality: Right;  Allograft Reconstrucation Right Patella Tendon  . REPLACEMENT TOTAL KNEE BILATERAL    . TOTAL HIP ARTHROPLASTY    . TOTAL KNEE ARTHROPLASTY  05/03/2011   Procedure: TOTAL KNEE ARTHROPLASTY;  Surgeon: Fuller Canada, MD;  Location: [facility] ORS;  Service: Orthopedics;  Laterality: Right;  With DePuy  . TOTAL THYROIDECTOMY  March 2011   Dr. Suszanne Conners @ [facility]  . TUBAL LIGATION      Current Outpatient Medications  Medication Sig Dispense Refill  . ALPRAZolam (XANAX) 0.25 MG tablet Take 1 tablet by mouth daily.   4  . aspirin EC 81 MG tablet Take 81 mg by mouth daily.    . cholecalciferol (VITAMIN D3) 25 MCG (1000 UT) tablet Take 1,000  Units by mouth daily.    . furosemide (LASIX) 20 MG tablet Take 1 tablet by mouth daily.  11  . ibuprofen (ADVIL,MOTRIN) 200 MG tablet Take 600 mg by mouth daily as needed for moderate pain.    Marland Kitchen levothyroxine (SYNTHROID, LEVOTHROID) 150 MCG tablet Take 137 mcg by mouth every morning.     Marland Kitchen PARoxetine (PAXIL) 20 MG tablet Take 20 mg by mouth every evening.      . TOVIAZ 8 MG TB24 tablet Take 1 tablet (8 mg total) by mouth daily. 30 tablet 0   No current facility-administered medications for this visit.     No Known Allergies  Social History   Socioeconomic History  . Marital status: Widowed    Spouse name: Not on file  . Number of children: 4  . Years of education: Not on file  . Highest education level: Not on file  Occupational History  . Occupation: Retired-Office work  Tobacco Use  . Smoking status: Former Smoker    Types: Cigarettes    Quit date: 09/27/1978    Years since quitting: 41.7  . Smokeless tobacco: Never Used  Vaping Use  . Vaping Use: Never used  Substance and Sexual Activity  . Alcohol use: No  . Drug use: No  . Sexual activity: Never  Other Topics Concern  . Not on file  Social History Narrative   ** Merged History Encounter **       Social Determinants of Health   Financial Resource Strain:   . Difficulty of Paying Living Expenses: Not on file  Food Insecurity:   . Worried About Programme researcher, broadcasting/film/video in the Last Year: Not on file  . Ran Out of Food in the Last Year: Not on file  Transportation Needs:   . Lack of Transportation (Medical): Not on file  . Lack of Transportation (Non-Medical): Not on file  Physical Activity:   . Days of Exercise per Week: Not on file  . Minutes of Exercise per Session: Not on file  Stress:   . Feeling of Stress : Not on file  Social Connections:   . Frequency of Communication with Friends and Family: Not on file  . Frequency of Social Gatherings with Friends and Family: Not on file  . Attends Religious Services: Not on file  . Active Member of Clubs or Organizations: Not on file  . Attends Banker Meetings: Not on file  . Marital Status: Not on file  Intimate Partner Violence:   . Fear of Current or Ex-Partner: Not on file  . Emotionally Abused: Not on file  . Physically Abused: Not on file  . Sexually Abused: Not on file    Family History  Problem Relation Age of Onset  . Colon cancer Mother   . Blindness Sister   . Anesthesia problems Neg Hx   . Hypotension Neg Hx   . Malignant  hyperthermia Neg Hx   . Pseudochol deficiency Neg Hx     Review of Systems:  As stated in the HPI and otherwise negative.   BP (!) 150/86   Pulse 62   Ht 5\' 5"  (1.651 m)   SpO2 96%   BMI 33.28 kg/m   Physical Examination: General: Well developed, well nourished, NAD  HEENT: OP clear, mucus membranes moist  SKIN: warm, dry. No rashes. Neuro: No focal deficits  Musculoskeletal: Muscle strength 5/5 all ext  Psychiatric: Mood and affect normal  Neck: No JVD, no carotid bruits, no thyromegaly,  no lymphadenopathy.  Lungs:Clear bilaterally, no wheezes, rhonci, crackles Cardiovascular: Regular rate and rhythm. Loud, harsh, late peaking systolic murmur.  Abdomen:Soft. Bowel sounds present. Non-tender.  Extremities:  No lower extremity edema. Pulses are 2 + in the bilateral DP/PT.  EKG:  EKG is ordered today. The ekg ordered today demonstrates Sinus bradycardia, rate 59 bpm. Old inferior Q waves.   Echo 06/16/20: 1. Left ventricular ejection fraction, by estimation, is 65 to 70%. The  left ventricle has normal function. The left ventricle has no regional  wall motion abnormalities. There is moderate left ventricular hypertrophy.  Left ventricular diastolic  parameters are consistent with Grade I diastolic dysfunction (impaired  relaxation).  2. Right ventricular systolic function is normal. The right ventricular  size is normal. There is normal pulmonary artery systolic pressure. The  estimated right ventricular systolic pressure is 34.6 mmHg.  3. Left atrial size was severely dilated.  4. Right atrial size was upper normal.  5. The mitral valve is abnormal. Trivial mitral valve regurgitation.  Moderate mitral annular calcification.  6. The aortic valve has an indeterminant number of cusps. There is severe  calcifcation of the aortic valve. Aortic valve regurgitation is trivial.  Severe aortic valve stenosis. Aortic valve mean gradient measures 42.0  mmHg. Aortic valve Vmax  measures  4.27 m/s. Dimentionless index 0.30.  7. The inferior vena cava is normal in size with greater than 50%  respiratory variability, suggesting right atrial pressure of 3 mmHg.   FINDINGS  Left Ventricle: Left ventricular ejection fraction, by estimation, is 65  to 70%. The left ventricle has normal function. The left ventricle has no  regional wall motion abnormalities. The left ventricular internal cavity  size was normal in size. There is  moderate left ventricular hypertrophy. Left ventricular diastolic  parameters are consistent with Grade I diastolic dysfunction (impaired  relaxation).   Right Ventricle: The right ventricular size is normal. No increase in  right ventricular wall thickness. Right ventricular systolic function is  normal. There is normal pulmonary artery systolic pressure. The tricuspid  regurgitant velocity is 2.81 m/s, and  with an assumed right atrial pressure of 3 mmHg, the estimated right  ventricular systolic pressure is 34.6 mmHg.   Left Atrium: Left atrial size was severely dilated.   Right Atrium: Right atrial size was upper normal.   Pericardium: There is no evidence of pericardial effusion.   Mitral Valve: The mitral valve is abnormal. Moderate mitral annular  calcification. Trivial mitral valve regurgitation.   Tricuspid Valve: The tricuspid valve is grossly normal. Tricuspid valve  regurgitation is mild.   Aortic Valve: The aortic valve has an indeterminant number of cusps. There  is severe calcifcation of the aortic valve. There is moderate aortic valve  annular calcification. Aortic valve regurgitation is trivial. Severe  aortic stenosis is present. Aortic  valve mean gradient measures 42.0 mmHg. Aortic valve peak gradient  measures 72.9 mmHg. Aortic valve area, by VTI measures 0.77 cm.   Pulmonic Valve: The pulmonic valve was grossly normal. Pulmonic valve  regurgitation is trivial.   Aorta: The aortic root is normal in size  and structure.   Venous: The inferior vena cava is normal in size with greater than 50%  respiratory variability, suggesting right atrial pressure of 3 mmHg.   IAS/Shunts: No atrial level shunt detected by color flow Doppler.     LEFT VENTRICLE  PLAX 2D  LVIDd:     3.01 cm Diastology  LVIDs:     2.00  cm LV e' medial:  5.55 cm/s  LV PW:     1.29 cm LV E/e' medial: 16.9  LV IVS:    1.87 cm LV e' lateral:  6.42 cm/s  LVOT diam:   1.80 cm LV E/e' lateral: 14.6  LV SV:     90  LV SV Index:  43  LVOT Area:   2.54 cm     RIGHT VENTRICLE  RV S prime:   9.79 cm/s  TAPSE (M-mode): 2.5 cm   LEFT ATRIUM       Index    RIGHT ATRIUM      Index  LA diam:    2.40 cm 1.16 cm/m RA Area:   20.80 cm  LA Vol (A2C):  84.1 ml 40.71 ml/m RA Volume:  70.20 ml 33.98 ml/m  LA Vol (A4C):  117.0 ml 56.63 ml/m  LA Biplane Vol: 100.0 ml 48.40 ml/m  AORTIC VALVE  AV Area (Vmax):  0.75 cm  AV Area (Vmean):  0.82 cm  AV Area (VTI):   0.77 cm  AV Vmax:      427.00 cm/s  AV Vmean:     302.000 cm/s  AV VTI:      1.170 m  AV Peak Grad:   72.9 mmHg  AV Mean Grad:   42.0 mmHg  LVOT Vmax:     126.00 cm/s  LVOT Vmean:    97.400 cm/s  LVOT VTI:     0.352 m  LVOT/AV VTI ratio: 0.30    AORTA  Ao Root diam: 3.10 cm   MITRAL VALVE        TRICUSPID VALVE  MV Area (PHT): 2.05 cm   TR Peak grad:  31.6 mmHg  MV Decel Time: 370 msec   TR Vmax:    281.00 cm/s  MV E velocity: 93.60 cm/s  MV A velocity: 111.00 cm/s SHUNTS  MV E/A ratio: 0.84     Systemic VTI: 0.35 m               Systemic Diam: 1.80 cm   Recent Labs: 02/14/2020: BUN 20; Creatinine, Ser 0.70; Hemoglobin 12.5; Platelets 247; Potassium 3.5; Sodium 142   Lipid Panel No results found for: CHOL, TRIG, HDL, CHOLHDL, VLDL, LDLCALC, LDLDIRECT   Wt Readings from Last 3 Encounters:  02/14/20 200 lb  (90.7 kg)  08/16/18 243 lb (110.2 kg)  02/16/17 250 lb 4.8 oz (113.5 kg)     Other studies Reviewed: Additional studies/ records that were reviewed today include: echo images, EKG, office notes.  Review of the above records demonstrates: Severe AS   Assessment and Plan:   1. Severe Aortic Valve Stenosis: She has severe, stage D aortic valve stenosis. I have personally reviewed the echo images. The aortic valve is thickened, calcified with limited leaflet mobility. I think she would benefit from AVR. Given advanced age, she is not a good candidate for conventional AVR by surgical approach. I think she may be a good candidate for TAVR. She is confined to a wheelchair due to chronic knee issues. She does not ambulate.   STS Risk Score: Risk of Mortality: 2.012% Renal Failure: 1.844% Permanent Stroke: 1.209% Prolonged Ventilation: 7.751% DSW Infection: 0.153% Reoperation: 3.011% Morbidity or Mortality: 12.665% Short Length of Stay: 23.554% Long Length of Stay: 6.781%  I have reviewed the natural history of aortic stenosis with the patient and their family members  who are present today. We have discussed the limitations of medical therapy and the poor prognosis associated  with symptomatic aortic stenosis. We have reviewed potential treatment options, including palliative medical therapy, conventional surgical aortic valve replacement, and transcatheter aortic valve replacement. We discussed treatment options in the context of the patient's specific comorbid medical conditions.   She would like to proceed with planning for TAVR. I will arrange a right and left heart catheterization at [facility] 07/04/20. Right groin for access. Risks and benefits of the cath procedure and the valve procedure are reviewed with the patient. After the cath, she will have a cardiac CT, CTA of the chest/abdomen and pelvis, carotid artery dopplers, PT assessment and will then be referred to see one of the CT  surgeons on our TAVR team.   Current medicines are reviewed at length with the patient today.  The patient does not have concerns regarding medicines.  The following changes have been made:  no change  Labs/ tests ordered today include:   Orders Placed This Encounter  Procedures  . CBC  . Basic metabolic panel  . EKG 12-Lead     Disposition:   FU with the valve team.    Signed, Verne Carrow, MD 06/23/2020 2:39 PM    [facility] [address] [place], [place], [place]  [address] Phone: [phone]; Fax: [phone]

## 2020-06-24 LAB — BASIC METABOLIC PANEL
BUN/Creatinine Ratio: 22 (ref 12–28)
BUN: 16 mg/dL (ref 8–27)
CO2: 25 mmol/L (ref 20–29)
Calcium: 9.5 mg/dL (ref 8.7–10.3)
Chloride: 101 mmol/L (ref 96–106)
Creatinine, Ser: 0.73 mg/dL (ref 0.57–1.00)
GFR calc Af Amer: 87 mL/min/{1.73_m2} (ref >59)
GFR calc non Af Amer: 75 mL/min/{1.73_m2} (ref >59)
Glucose: 82 mg/dL (ref 65–99)
Potassium: 3.8 mmol/L (ref 3.5–5.2)
Sodium: 143 mmol/L (ref 134–144)

## 2020-06-24 LAB — CBC
Hematocrit: 41.7 % (ref 34.0–46.6)
Hemoglobin: 13.4 g/dL (ref 11.1–15.9)
MCH: 27.7 pg (ref 26.6–33.0)
MCHC: 32.1 g/dL (ref 31.5–35.7)
MCV: 86 fL (ref 79–97)
Platelets: 282 10*3/uL (ref 150–450)
RBC: 4.84 x10E6/uL (ref 3.77–5.28)
RDW: 13.4 % (ref 11.7–15.4)
WBC: 7.8 10*3/uL (ref 3.4–10.8)

## 2020-06-30 ENCOUNTER — Other Ambulatory Visit (HOSPITAL_COMMUNITY): Payer: Medicare Other

## 2020-07-01 ENCOUNTER — Other Ambulatory Visit (HOSPITAL_COMMUNITY): Payer: Medicare Other

## 2020-07-02 ENCOUNTER — Other Ambulatory Visit: Payer: Self-pay

## 2020-07-02 ENCOUNTER — Other Ambulatory Visit (HOSPITAL_COMMUNITY)
Admission: RE | Admit: 2020-07-02 | Discharge: 2020-07-02 | Disposition: A | Discharge status: Home / Self Care | Payer: Medicare Other | Source: Ambulatory Visit | Attending: Cardiovascular Disease | Admitting: Cardiovascular Disease

## 2020-07-02 DIAGNOSIS — Z01812 Encounter for preprocedural laboratory examination: Secondary | ICD-10-CM | POA: Diagnosis not present

## 2020-07-02 DIAGNOSIS — Z20822 Contact with and (suspected) exposure to covid-19: Secondary | ICD-10-CM | POA: Diagnosis not present

## 2020-07-02 LAB — SARS CORONAVIRUS 2 (TAT 6-24 HRS): SARS Coronavirus 2: NEGATIVE

## 2020-07-03 ENCOUNTER — Other Ambulatory Visit: Payer: Self-pay | Admitting: Physician Assistant

## 2020-07-03 ENCOUNTER — Telehealth: Payer: Self-pay | Admitting: *Deleted

## 2020-07-03 DIAGNOSIS — I350 Nonrheumatic aortic (valve) stenosis: Secondary | ICD-10-CM

## 2020-07-03 NOTE — Telephone Encounter (Addendum)
Pt contacted pre-catheterization scheduled at [facility] for: Friday July 04, 2020 12 Noon Verified arrival time and place: [facility] Main Entrance A [facility]) at: 10 AM   No solid food after midnight prior to cath, clear liquids until 5 AM day of procedure.  Hold: Lasix-AM of procedure  Except hold medications AM meds can be  taken pre-cath with sips of water including: ASA 81 mg   Confirmed patient has responsible adult to drive home post procedure and be with patient first 24 hours after arriving home: yes *see notes below   You are allowed ONE visitor in the waiting room during the time you are at the hospital for your procedure. Both you and your visitor must wear a mask once you enter the hospital.       COVID-19 Pre-Screening Questions:  . In the past 14 days have you had a new cough, new headache, new nasal congestion, fever (100.4 or greater) unexplained body aches, new sore throat, or sudden loss of taste or sense of smell? no . In the past 14 days have you been around anyone with known Covid 19? no . Have you been vaccinated for COVID-19? Yes, see immunization history  Reviewed procedure/mask/visitor instructions, COVID-19 questions with patient.   Pt made aware that plan is for same day discharge tomorrow and would need responsible adult with her first 24 hours after arriving home. Pt stated that she may have to stay overnight at the hospital after procedure because she may not have responsible adult to be with her first 24 hours if same day discharge. I offered to reschedule to a more convenient day/time to make arrangements for someone to be with her after arriving home.  Pt did not want to reschedule, states she will make arrangements with her daughter for responsible adult first 24 hours after arriving home tomorrow.  Pt states she does have transportation.

## 2020-07-03 NOTE — Telephone Encounter (Signed)
Thanks

## 2020-07-04 ENCOUNTER — Encounter: Payer: Self-pay | Admitting: Physician Assistant

## 2020-07-04 ENCOUNTER — Observation Stay (HOSPITAL_COMMUNITY)
Admission: RE | Admit: 2020-07-04 | Discharge: 2020-07-05 | Disposition: A | Discharge status: Home / Self Care | Payer: Medicare Other | Attending: Cardiovascular Disease | Admitting: Cardiovascular Disease

## 2020-07-04 ENCOUNTER — Encounter (HOSPITAL_COMMUNITY)
Admission: RE | Disposition: A | Discharge status: Home / Self Care | Payer: Medicare Other | Source: Home / Self Care | Attending: Cardiovascular Disease

## 2020-07-04 ENCOUNTER — Other Ambulatory Visit: Payer: Self-pay

## 2020-07-04 ENCOUNTER — Encounter (HOSPITAL_COMMUNITY): Payer: Self-pay | Admitting: Cardiovascular Disease

## 2020-07-04 DIAGNOSIS — Z79899 Other long term (current) drug therapy: Secondary | ICD-10-CM | POA: Insufficient documentation

## 2020-07-04 DIAGNOSIS — Z87891 Personal history of nicotine dependence: Secondary | ICD-10-CM | POA: Diagnosis not present

## 2020-07-04 DIAGNOSIS — Z96653 Presence of artificial knee joint, bilateral: Secondary | ICD-10-CM | POA: Diagnosis not present

## 2020-07-04 DIAGNOSIS — Z7982 Long term (current) use of aspirin: Secondary | ICD-10-CM | POA: Diagnosis not present

## 2020-07-04 DIAGNOSIS — E039 Hypothyroidism, unspecified: Secondary | ICD-10-CM | POA: Insufficient documentation

## 2020-07-04 DIAGNOSIS — I2510 Atherosclerotic heart disease of native coronary artery without angina pectoris: Secondary | ICD-10-CM

## 2020-07-04 DIAGNOSIS — Z96642 Presence of left artificial hip joint: Secondary | ICD-10-CM | POA: Insufficient documentation

## 2020-07-04 DIAGNOSIS — I350 Nonrheumatic aortic (valve) stenosis: Principal | ICD-10-CM

## 2020-07-04 DIAGNOSIS — Z4889 Encounter for other specified surgical aftercare: Secondary | ICD-10-CM

## 2020-07-04 HISTORY — PX: CARDIAC CATHETERIZATION: SHX172

## 2020-07-04 HISTORY — PX: RIGHT/LEFT HEART CATH AND CORONARY ANGIOGRAPHY: CATH118266

## 2020-07-04 LAB — POCT I-STAT 7, (LYTES, BLD GAS, ICA,H+H)
Acid-Base Excess: 2 mmol/L (ref 0.0–2.0)
Bicarbonate: 27.5 mmol/L (ref 20.0–28.0)
Calcium, Ion: 1.06 mmol/L — ABNORMAL LOW (ref 1.15–1.40)
HCT: 35 % — ABNORMAL LOW (ref 36.0–46.0)
Hemoglobin: 11.9 g/dL — ABNORMAL LOW (ref 12.0–15.0)
O2 Saturation: 98 %
Potassium: 2.8 mmol/L — ABNORMAL LOW (ref 3.5–5.1)
Sodium: 144 mmol/L (ref 135–145)
TCO2: 29 mmol/L (ref 22–32)
pCO2 arterial: 44.2 mmHg (ref 32.0–48.0)
pH, Arterial: 7.402 (ref 7.350–7.450)
pO2, Arterial: 99 mmHg (ref 83.0–108.0)

## 2020-07-04 LAB — POCT I-STAT EG7
Acid-Base Excess: 3 mmol/L — ABNORMAL HIGH (ref 0.0–2.0)
Bicarbonate: 29.4 mmol/L — ABNORMAL HIGH (ref 20.0–28.0)
Calcium, Ion: 1.16 mmol/L (ref 1.15–1.40)
HCT: 37 % (ref 36.0–46.0)
Hemoglobin: 12.6 g/dL (ref 12.0–15.0)
O2 Saturation: 73 %
Potassium: 2.9 mmol/L — ABNORMAL LOW (ref 3.5–5.1)
Sodium: 144 mmol/L (ref 135–145)
TCO2: 31 mmol/L (ref 22–32)
pCO2, Ven: 49.5 mmHg (ref 44.0–60.0)
pH, Ven: 7.381 (ref 7.250–7.430)
pO2, Ven: 40 mmHg (ref 32.0–45.0)

## 2020-07-04 SURGERY — RIGHT/LEFT HEART CATH AND CORONARY ANGIOGRAPHY
Anesthesia: LOCAL

## 2020-07-04 MED ORDER — SODIUM CHLORIDE 0.9 % WEIGHT BASED INFUSION: 1.0000 mL/kg/h | INTRAVENOUS | Status: DC

## 2020-07-04 MED ORDER — HYDRALAZINE HCL 20 MG/ML IJ SOLN
INTRAMUSCULAR | Status: DC | PRN
  Administered 2020-07-04: 10 mg via INTRAVENOUS

## 2020-07-04 MED ORDER — LABETALOL HCL 5 MG/ML IV SOLN: 10.0000 mg | INTRAVENOUS | Status: AC | PRN

## 2020-07-04 MED ORDER — HEPARIN SODIUM (PORCINE) 1000 UNIT/ML IJ SOLN
INTRAMUSCULAR | Status: DC | PRN
  Administered 2020-07-04: 5000 [IU] via INTRAVENOUS

## 2020-07-04 MED ORDER — FUROSEMIDE 20 MG PO TABS
20.0000 mg | ORAL_TABLET | Freq: Every day | ORAL | Status: DC
  Administered 2020-07-05: 20 mg via ORAL
  Filled 2020-07-04: qty 1

## 2020-07-04 MED ORDER — SODIUM CHLORIDE 0.9 % WEIGHT BASED INFUSION
3.0000 mL/kg/h | INTRAVENOUS | Status: DC
  Administered 2020-07-04: 3 mL/kg/h via INTRAVENOUS

## 2020-07-04 MED ORDER — ASPIRIN 81 MG PO CHEW: 81.0000 mg | CHEWABLE_TABLET | ORAL | Status: DC

## 2020-07-04 MED ORDER — HYDRALAZINE HCL 20 MG/ML IJ SOLN
INTRAMUSCULAR | Status: AC
  Filled 2020-07-04: qty 1

## 2020-07-04 MED ORDER — VERAPAMIL HCL 2.5 MG/ML IV SOLN
INTRAVENOUS | Status: AC
  Filled 2020-07-04: qty 2

## 2020-07-04 MED ORDER — ONDANSETRON HCL 4 MG/2ML IJ SOLN: 4.0000 mg | Freq: Four times a day (QID) | INTRAMUSCULAR | Status: DC | PRN

## 2020-07-04 MED ORDER — MIDAZOLAM HCL 2 MG/2ML IJ SOLN
INTRAMUSCULAR | Status: DC | PRN
  Administered 2020-07-04: 1 mg via INTRAVENOUS

## 2020-07-04 MED ORDER — HEPARIN SODIUM (PORCINE) 1000 UNIT/ML IJ SOLN
INTRAMUSCULAR | Status: AC
  Filled 2020-07-04: qty 1

## 2020-07-04 MED ORDER — HEPARIN (PORCINE) IN NACL 1000-0.9 UT/500ML-% IV SOLN
INTRAVENOUS | Status: AC
  Filled 2020-07-04: qty 1000

## 2020-07-04 MED ORDER — FENTANYL CITRATE (PF) 100 MCG/2ML IJ SOLN
INTRAMUSCULAR | Status: AC
  Filled 2020-07-04: qty 2

## 2020-07-04 MED ORDER — ACETAMINOPHEN 325 MG PO TABS
650.0000 mg | ORAL_TABLET | ORAL | Status: DC | PRN
  Administered 2020-07-04 – 2020-07-05 (×2): 650 mg via ORAL
  Filled 2020-07-04 (×2): qty 2

## 2020-07-04 MED ORDER — PAROXETINE HCL 20 MG PO TABS
20.0000 mg | ORAL_TABLET | Freq: Every day | ORAL | Status: DC
  Administered 2020-07-05: 20 mg via ORAL
  Filled 2020-07-04: qty 1

## 2020-07-04 MED ORDER — ASPIRIN EC 81 MG PO TBEC
81.0000 mg | DELAYED_RELEASE_TABLET | Freq: Every day | ORAL | Status: DC
  Administered 2020-07-05: 81 mg via ORAL
  Filled 2020-07-04 (×2): qty 1

## 2020-07-04 MED ORDER — HEPARIN (PORCINE) IN NACL 1000-0.9 UT/500ML-% IV SOLN
INTRAVENOUS | Status: DC | PRN
  Administered 2020-07-04 (×2): 500 mL

## 2020-07-04 MED ORDER — SODIUM CHLORIDE 0.9% FLUSH
3.0000 mL | Freq: Two times a day (BID) | INTRAVENOUS | Status: DC
  Administered 2020-07-04 – 2020-07-05 (×2): 3 mL via INTRAVENOUS

## 2020-07-04 MED ORDER — LIDOCAINE HCL (PF) 1 % IJ SOLN
INTRAMUSCULAR | Status: AC
  Filled 2020-07-04: qty 30

## 2020-07-04 MED ORDER — SODIUM CHLORIDE 0.9 % IV SOLN: 250.0000 mL | INTRAVENOUS | Status: DC | PRN

## 2020-07-04 MED ORDER — VERAPAMIL HCL 2.5 MG/ML IV SOLN
INTRAVENOUS | Status: DC | PRN
  Administered 2020-07-04 (×2): 10 mL via INTRA_ARTERIAL

## 2020-07-04 MED ORDER — SODIUM CHLORIDE 0.9% FLUSH: 3.0000 mL | INTRAVENOUS | Status: DC | PRN

## 2020-07-04 MED ORDER — FENTANYL CITRATE (PF) 100 MCG/2ML IJ SOLN
INTRAMUSCULAR | Status: DC | PRN
Start: 2020-07-04 — End: 2020-07-04
  Administered 2020-07-04: 25 ug via INTRAVENOUS

## 2020-07-04 MED ORDER — HYDRALAZINE HCL 20 MG/ML IJ SOLN: 10.0000 mg | INTRAMUSCULAR | Status: AC | PRN

## 2020-07-04 MED ORDER — SODIUM CHLORIDE 0.9 % IV SOLN: INTRAVENOUS | Status: DC

## 2020-07-04 MED ORDER — LEVOTHYROXINE SODIUM 137 MCG PO TABS
137.0000 ug | ORAL_TABLET | Freq: Every day | ORAL | Status: DC
  Administered 2020-07-05: 137 ug via ORAL
  Filled 2020-07-04: qty 1

## 2020-07-04 MED ORDER — LIDOCAINE HCL (PF) 1 % IJ SOLN
INTRAMUSCULAR | Status: DC | PRN
  Administered 2020-07-04 (×2): 2 mL

## 2020-07-04 MED ORDER — FESOTERODINE FUMARATE ER 8 MG PO TB24
8.0000 mg | ORAL_TABLET | Freq: Every day | ORAL | Status: DC
  Administered 2020-07-05: 8 mg via ORAL
  Filled 2020-07-04: qty 1

## 2020-07-04 MED ORDER — MIDAZOLAM HCL 2 MG/2ML IJ SOLN
INTRAMUSCULAR | Status: AC
  Filled 2020-07-04: qty 2

## 2020-07-04 MED ORDER — IOHEXOL 350 MG/ML SOLN
INTRAVENOUS | Status: DC | PRN
  Administered 2020-07-04: 65 mL

## 2020-07-04 SURGICAL SUPPLY — 15 items
CATH 5FR JL3.5 JR4 ANG PIG MP (CATHETERS) ×1 IMPLANT
CATH INFINITI 5FR AL1 (CATHETERS) ×1 IMPLANT
CATH SWAN GANZ 7F STRAIGHT (CATHETERS) ×1 IMPLANT
DEVICE RAD COMP TR BAND LRG (VASCULAR PRODUCTS) ×1 IMPLANT
GLIDESHEATH SLEND SS 6F .021 (SHEATH) ×1 IMPLANT
GLIDESHEATH SLENDER 7FR .021G (SHEATH) ×1 IMPLANT
GUIDEWIRE .025 260CM (WIRE) ×1 IMPLANT
GUIDEWIRE INQWIRE 1.5J.035X260 (WIRE) IMPLANT
INQWIRE 1.5J .035X260CM (WIRE) ×2
KIT HEART LEFT (KITS) ×2 IMPLANT
PACK CARDIAC CATHETERIZATION (CUSTOM PROCEDURE TRAY) ×2 IMPLANT
SHEATH 6FR 75 DEST SLENDER (SHEATH) ×1 IMPLANT
TRANSDUCER W/STOPCOCK (MISCELLANEOUS) ×2 IMPLANT
TUBING CIL FLEX 10 FLL-RA (TUBING) ×2 IMPLANT
WIRE EMERALD ST .035X150CM (WIRE) ×1 IMPLANT

## 2020-07-04 NOTE — Progress Notes (Signed)
Report called to Barbie on 3E.

## 2020-07-04 NOTE — Plan of Care (Signed)
  Problem: Education: Goal: Knowledge of General Education information will improve Description: Including pain rating scale, medication(s)/side effects and non-pharmacologic comfort measures Outcome: Progressing   Problem: Education: Goal: Understanding of CV disease, CV risk reduction, and recovery process will improve Outcome: Progressing Goal: Individualized Educational Video(s) Outcome: Progressing   Problem: Activity: Goal: Ability to return to baseline activity level will improve Outcome: Progressing   Problem: Cardiovascular: Goal: Ability to achieve and maintain adequate cardiovascular perfusion will improve Outcome: Progressing Goal: Vascular access site(s) Level 0-1 will be maintained Outcome: Progressing   Problem: Health Behavior/Discharge Planning: Goal: Ability to safely manage health-related needs after discharge will improve Outcome: Progressing   

## 2020-07-04 NOTE — Progress Notes (Signed)
Discharge instructions reviewed with pt. Called pt daughter [name] and she states that her mom has to use her arms to get up and to go to the bathroom, and to do most things. I asked her if she had someone else to help her at home, she states No. Janne Napoleon, Rn called and informed.

## 2020-07-04 NOTE — Progress Notes (Signed)
Attempted to call report to 6th floor Room not cleaned at this time.

## 2020-07-04 NOTE — Research (Signed)
[place] Informed Consent   Subject Name: [name]  Subject met inclusion and exclusion criteria.  The informed consent form, study requirements and expectations were reviewed with the subject and questions and concerns were addressed prior to the signing of the consent form.  The subject verbalized understanding of the trail requirements.  The subject agreed to participate in the [facility] trial and signed the informed consent.  The informed consent was obtained prior to performance of any protocol-specific procedures for the subject.  A copy of the signed informed consent was given to the subject and a copy was placed in the subject's medical record.  Mena Goes. 07/04/2020, 10:40 am

## 2020-07-04 NOTE — Progress Notes (Signed)
Pt daughter called [name]) and informed pt will be admitted. Pt informed

## 2020-07-04 NOTE — Interval H&P Note (Signed)
History and Physical Interval Note:  07/04/2020 10:37 AM  Daleen Bo  has presented today for surgery, with the diagnosis of aortic stenosis.  The various methods of treatment have been discussed with the patient and family. After consideration of risks, benefits and other options for treatment, the patient has consented to  Procedure(s): RIGHT/LEFT HEART CATH AND CORONARY ANGIOGRAPHY (N/A) as a surgical intervention.  [name] history has been reviewed, patient examined, no change in status, stable for surgery.  I have reviewed the patient's chart and labs.  Questions were answered to the patient's satisfaction.    Cath Lab Visit (complete for each Cath Lab visit)  Clinical Evaluation Leading to the Procedure:   ACS: No.  Non-ACS:    Anginal Classification: No Symptoms  Anti-ischemic medical therapy: No Therapy  Non-Invasive Test Results: No non-invasive testing performed  Prior CABG: No previous CABG        Verne Carrow

## 2020-07-05 DIAGNOSIS — I350 Nonrheumatic aortic (valve) stenosis: Secondary | ICD-10-CM | POA: Diagnosis not present

## 2020-07-05 DIAGNOSIS — Z87891 Personal history of nicotine dependence: Secondary | ICD-10-CM | POA: Diagnosis not present

## 2020-07-05 DIAGNOSIS — Z96653 Presence of artificial knee joint, bilateral: Secondary | ICD-10-CM | POA: Diagnosis not present

## 2020-07-05 DIAGNOSIS — Z7982 Long term (current) use of aspirin: Secondary | ICD-10-CM | POA: Diagnosis not present

## 2020-07-05 DIAGNOSIS — Z96642 Presence of left artificial hip joint: Secondary | ICD-10-CM | POA: Diagnosis not present

## 2020-07-05 DIAGNOSIS — E039 Hypothyroidism, unspecified: Secondary | ICD-10-CM | POA: Diagnosis not present

## 2020-07-05 DIAGNOSIS — I2510 Atherosclerotic heart disease of native coronary artery without angina pectoris: Secondary | ICD-10-CM | POA: Diagnosis not present

## 2020-07-05 DIAGNOSIS — Z79899 Other long term (current) drug therapy: Secondary | ICD-10-CM | POA: Diagnosis not present

## 2020-07-05 NOTE — Progress Notes (Signed)
Progress Note  Patient Name: [name] Date of Encounter: 07/05/2020  [facility] Cardiologist: Nona Dell, MD   Subjective   Feeling well. No chest pain or shortness of breath.  She had a headache that has resolved.  Inpatient Medications    Scheduled Meds: . aspirin EC  81 mg Oral Daily  . fesoterodine  8 mg Oral Daily  . furosemide  20 mg Oral Daily  . levothyroxine  137 mcg Oral QAC breakfast  . PARoxetine  20 mg Oral Daily  . sodium chloride flush  3 mL Intravenous Q12H   Continuous Infusions: . sodium chloride     PRN Meds: sodium chloride, acetaminophen, ondansetron (ZOFRAN) IV, sodium chloride flush   Vital Signs    Vitals:   07/04/20 1956 07/04/20 2332 07/05/20 0420 07/05/20 0724  BP: (!) 141/73 (!) 149/49 (!) 156/58 (!) 147/64  Pulse: 61 (!) 58 (!) 59 71  Resp: 18 16 18 18   Temp: 99.6 F (37.6 C) 98.3 F (36.8 C) 98.7 F (37.1 C) 98 F (36.7 C)  TempSrc: Oral Oral Oral Oral  SpO2: 99% 94% 95% 96%  Weight:   107 kg   Height:        Intake/Output Summary (Last 24 hours) at 07/05/2020 1021 Last data filed at 07/05/2020 0525 Gross per 24 hour  Intake 1059.33 ml  Output 1301 ml  Net -241.67 ml   Last 3 Weights 07/05/2020 07/04/2020 06/23/2020  Weight (lbs) 235 lb 14.3 oz 242 lb 8.1 oz 242 lb 8.1 oz  Weight (kg) 107 kg 110 kg 110 kg      Telemetry    Sinus rhythm.  PVCs.- Personally Reviewed  ECG    N/A  Physical Exam   VS:  BP (!) 147/64 (BP Location: Left Arm)   Pulse 71   Temp 98 F (36.7 C) (Oral)   Resp 18   Ht 5' 9.5" (1.765 m)   Wt 107 kg   SpO2 96%   BMI 34.34 kg/m  , BMI Body mass index is 34.34 kg/m. GENERAL:  Well appearing.  No acute distress. HEENT: Pupils equal round and reactive, fundi not visualized, oral mucosa unremarkable NECK:  No jugular venous distention, waveform within normal limits, carotid upstroke brisk and symmetric, no bruits, no thyromegaly LYMPHATICS:  No cervical adenopathy LUNGS:   Clear to auscultation bilaterally HEART:  RRR.  PMI not displaced or sustained,S1 and S2 within normal limits, no S3, no S4, no clicks, no rubs, late-peaking III/VI late-peaking systolic murmur at the LUSB ABD:  Flat, positive bowel sounds normal in frequency in pitch, no bruits, no rebound, no guarding, no midline pulsatile mass, no hepatomegaly, no splenomegaly EXT:  2 plus pulses throughout, no edema, no cyanosis no clubbing SKIN:  No rashes no nodules NEURO:  Cranial nerves II through XII grossly intact, motor grossly intact throughout PSYCH:  Cognitively intact, oriented to person place and time   Labs    High Sensitivity Troponin:  No results for input(s): TROPONINIHS in the last 720 hours.    Chemistry Recent Labs  Lab 07/04/20 1150 07/04/20 1155  NA 144 144  K 2.9* 2.8*     Hematology Recent Labs  Lab 07/04/20 1150 07/04/20 1155  HGB 12.6 11.9*  HCT 37.0 35.0*    BNPNo results for input(s): BNP, PROBNP in the last 168 hours.   DDimer No results for input(s): DDIMER in the last 168 hours.   Radiology    CARDIAC CATHETERIZATION  Result Date:  07/04/2020  Mid LAD lesion is 25% stenosed.  1. Mild non-obstructive CAD 2. Severe aortic stenosis (mean gradient 47.6 mmHg, peak to peak gradient 43 mmHg, AVA 0.89 cm2). Recommendations: Continue planning for TAVR    Cardiac Studies   LHC 07/04/20:  Mid LAD lesion is 25% stenosed.   1. Mild non-obstructive CAD 2. Severe aortic stenosis (mean gradient 47.6 mmHg, peak to peak gradient 43 mmHg, AVA 0.89 cm2).   Echo 06/16/20: IMPRESSIONS   1. Left ventricular ejection fraction, by estimation, is 65 to 70%. The  left ventricle has normal function. The left ventricle has no regional  wall motion abnormalities. There is moderate left ventricular hypertrophy.  Left ventricular diastolic  parameters are consistent with Grade I diastolic dysfunction (impaired  relaxation).  2. Right ventricular systolic function is  normal. The right ventricular  size is normal. There is normal pulmonary artery systolic pressure. The  estimated right ventricular systolic pressure is 34.6 mmHg.  3. Left atrial size was severely dilated.  4. Right atrial size was upper normal.  5. The mitral valve is abnormal. Trivial mitral valve regurgitation.  Moderate mitral annular calcification.  6. The aortic valve has an indeterminant number of cusps. There is severe  calcifcation of the aortic valve. Aortic valve regurgitation is trivial.  Severe aortic valve stenosis. Aortic valve mean gradient measures 42.0  mmHg. Aortic valve Vmax measures  4.27 m/s. Dimentionless index 0.30.  7. The inferior vena cava is normal in size with greater than 50%  respiratory variability, suggesting right atrial pressure of 3 mmHg.   Patient Profile     86 y.o. female with CAD, arthritis, anxiety/depression, hyperlipidemia, hypothyroidism, and severe aortic stenosis.  She was admitted for pre-TAVR evaluation.  Assessment & Plan    # Severe aortic stenosis:  Mean gradient was 47.6 mmHg on cath.  AVA 0.89 c^2.  On her echo her mean gradient was 42 mmHg.  She is being evaluated for TAVR.  She will have outpatient carotid Doppler and TAVR planning CT.  Follow up is scheduled with the valve team.  She is euvolemic and feeling well.   # CAD:  Mild 25% LAD stenosis noted on cath.  Continue aspirin.   Dispo: Discharge home today.   For questions or updates, please contact [facility] Please consult [email or web address] for contact info under        Signed, Chilton Si, MD  07/05/2020, 10:21 AM

## 2020-07-05 NOTE — Discharge Summary (Signed)
Discharge Summary    Patient ID: [name] MRN: [id number]; DOB: 11/11/1932  Admit date: 07/04/2020 Discharge date: 07/05/2020  Primary Care Provider: Assunta Found, MD  Primary Cardiologist: Nona Dell, MD   Discharge Diagnoses    Active Problems:   Severe aortic stenosis   Observation after surgery  Diagnostic Studies/Procedures    LHC 07/05/20:   Mid LAD lesion is 25% stenosed.   1. Mild non-obstructive CAD 2. Severe aortic stenosis (mean gradient 47.6 mmHg, peak to peak gradient 43 mmHg, AVA 0.89 cm2).   Recommendations: Continue planning for TAVR   Most Recent Value  Fick Cardiac Output 6.61 L/min  Fick Cardiac Output Index 2.92 (L/min)/BSA  Aortic Mean Gradient 47.6 mmHg  Aortic Peak Gradient 43 mmHg  Aortic Valve Area 0.89  Aortic Value Area Index 0.4 cm2/BSA  RA A Wave 3 mmHg  RA V Wave 0 mmHg  RA Mean 1 mmHg  RV Systolic Pressure 26 mmHg  RV Diastolic Pressure 1 mmHg  RV EDP 2 mmHg  PA Systolic Pressure 27 mmHg  PA Diastolic Pressure 10 mmHg  PA Mean 17 mmHg  PW A Wave 7 mmHg  PW V Wave 5 mmHg  PW Mean 6 mmHg  AO Systolic Pressure 184 mmHg  AO Diastolic Pressure 78 mmHg  AO Mean 120 mmHg  LV Systolic Pressure 225 mmHg  LV Diastolic Pressure 7 mmHg  LV EDP 13 mmHg  AOp Systolic Pressure 179 mmHg  AOp Diastolic Pressure 65 mmHg  AOp Mean Pressure 107 mmHg  LVp Systolic Pressure 222 mmHg  LVp Diastolic Pressure 8 mmHg  LVp EDP Pressure 15 mmHg  QP/QS 1  TPVR Index 5.83 HRUI  TSVR Index 41.13 HRUI  PVR SVR Ratio 0.09  TPVR/TSVR Ratio 0.14   History of Present Illness     [name] is a 86 y.o. female with a history of arthritis, CAD, chronic bronchitis, anxiety/depression, hyperlipidemia, hypothyroidism and severe aortic stenosis who was referred to Dr. Clifton James from Dr. Diona Browner for the evaulation of progressive AS. She has been followed for moderate aortic stenosis. Most recent echo in 05/2020 with normal LV size and  function with LVEF=70%. Moderate LVH. The aortic valve leaflets are thickened and calcified with limited leaflet excursion. Mean gradient 42 mmHg, peak gradient 72.9 mmHg, AVA 0.75 cm2, dimensionless index 0.30. She had a cardiac cath in 2018 and was found to have mild CAD at that time.   On Dr. Gibson Ramp evaluation, she had c/o dizzy spells lasting 10-15 seconds several times per day with progressive fatigue.   Given severe, stage D aortic valve stenosis plan was for TAVR workup.   Hospital Course     LHC with no significant obstructive CAD.  Mild LAD lesion at 25%.  There is severe aortic stenosis with a mean gradient at 47.6 mmHg with a peak to peak gradient at 43 mmHg and AVA at 0.89 cm.  Plan is for continued TAVR work-up.   Cardiac CT, CTA of the chest/abdomen and pelvis, carotid artery dopplers, and PT assessment are to be performed in the OP setting. She has follow up with Carlean Jews on 07/09/20 and will then be referred to see one of the CT surgeons on our TAVR team.    Severe aortic stenosis:  Mean gradient was 47.6 mmHg on cath.  AVA 0.89 c^2.  On her echo her mean gradient was 42 mmHg.  She is being evaluated for TAVR.  She will have outpatient carotid Doppler and TAVR planning CT.  Follow up is scheduled with the valve team.  She is euvolemic and feeling well.   CAD:  Mild 25% LAD stenosis noted on cath.  Continue aspirin.    Consultants: None  The patient was seen and examined by Dr. Duke Salvia who feels that she is stable and ready for discharge today, 07/05/2020.  Did the patient have an acute coronary syndrome (MI, NSTEMI, STEMI, etc) this admission?:  No                               Did the patient have a percutaneous coronary intervention (stent / angioplasty)?:  No.   _____________  Discharge Vitals Blood pressure (!) 163/58, pulse 63, temperature 98.6 F (37 C), temperature source Oral, resp. rate 18, height 5' 9.5" (1.765 m), weight 107 kg, SpO2 98 %.    Filed Weights   07/04/20 1027 07/05/20 0420  Weight: 110 kg 107 kg    Labs & Radiologic Studies    CBC Recent Labs    07/04/20 1150 07/04/20 1155  HGB 12.6 11.9*  HCT 37.0 35.0*   Basic Metabolic Panel Recent Labs    78/67/67 1150 07/04/20 1155  NA 144 144  K 2.9* 2.8*   Liver Function Tests No results for input(s): AST, ALT, ALKPHOS, BILITOT, PROT, ALBUMIN in the last 72 hours. No results for input(s): LIPASE, AMYLASE in the last 72 hours. High Sensitivity Troponin:   No results for input(s): TROPONINIHS in the last 720 hours.  BNP Invalid input(s): POCBNP D-Dimer No results for input(s): DDIMER in the last 72 hours. Hemoglobin A1C No results for input(s): HGBA1C in the last 72 hours. Fasting Lipid Panel No results for input(s): CHOL, HDL, LDLCALC, TRIG, CHOLHDL, LDLDIRECT in the last 72 hours. Thyroid Function Tests No results for input(s): TSH, T4TOTAL, T3FREE, THYROIDAB in the last 72 hours.  Invalid input(s): FREET3 _____________  CARDIAC CATHETERIZATION  Result Date: 07/04/2020  Mid LAD lesion is 25% stenosed.  1. Mild non-obstructive CAD 2. Severe aortic stenosis (mean gradient 47.6 mmHg, peak to peak gradient 43 mmHg, AVA 0.89 cm2). Recommendations: Continue planning for TAVR   ECHOCARDIOGRAM COMPLETE  Result Date: 06/16/2020    ECHOCARDIOGRAM REPORT   Patient Name:   [name] Date of Exam: 06/16/2020 Medical Rec #:  [id number]           Height:       69.0 in Accession #:    [id number]          Weight:       200.0 lb Date of Birth:  11-08-1932           BSA:          2.066 m Patient Age:    87 years            BP:           164/81 mmHg Patient Gender: F                   HR:           71 bpm. [facility] Procedure: 2D Echo, Cardiac Doppler and Color Doppler Indications:    I35.0 (ICD-10-CM) - Nonrheumatic aortic valve stenosis  History:        Patient has prior history of Echocardiogram examinations, most                 recent  12/03/2019. Risk Factors:Hypertension and  Dyslipidemia.                 NSTEMI (non-ST elevated myocardial infarction),Aortic stenosis                 (From Hx).  Sonographer:    Celesta Gentile RCS Referring Phys: [phone] SAMUEL G MCDOWELL IMPRESSIONS  1. Left ventricular ejection fraction, by estimation, is 65 to 70%. The left ventricle has normal function. The left ventricle has no regional wall motion abnormalities. There is moderate left ventricular hypertrophy. Left ventricular diastolic parameters are consistent with Grade I diastolic dysfunction (impaired relaxation).  2. Right ventricular systolic function is normal. The right ventricular size is normal. There is normal pulmonary artery systolic pressure. The estimated right ventricular systolic pressure is 34.6 mmHg.  3. Left atrial size was severely dilated.  4. Right atrial size was upper normal.  5. The mitral valve is abnormal. Trivial mitral valve regurgitation. Moderate mitral annular calcification.  6. The aortic valve has an indeterminant number of cusps. There is severe calcifcation of the aortic valve. Aortic valve regurgitation is trivial. Severe aortic valve stenosis. Aortic valve mean gradient measures 42.0 mmHg. Aortic valve Vmax measures 4.27 m/s. Dimentionless index 0.30.  7. The inferior vena cava is normal in size with greater than 50% respiratory variability, suggesting right atrial pressure of 3 mmHg. FINDINGS  Left Ventricle: Left ventricular ejection fraction, by estimation, is 65 to 70%. The left ventricle has normal function. The left ventricle has no regional wall motion abnormalities. The left ventricular internal cavity size was normal in size. There is  moderate left ventricular hypertrophy. Left ventricular diastolic parameters are consistent with Grade I diastolic dysfunction (impaired relaxation). Right Ventricle: The right ventricular size is normal. No increase in right ventricular wall thickness. Right ventricular systolic  function is normal. There is normal pulmonary artery systolic pressure. The tricuspid regurgitant velocity is 2.81 m/s, and  with an assumed right atrial pressure of 3 mmHg, the estimated right ventricular systolic pressure is 34.6 mmHg. Left Atrium: Left atrial size was severely dilated. Right Atrium: Right atrial size was upper normal. Pericardium: There is no evidence of pericardial effusion. Mitral Valve: The mitral valve is abnormal. Moderate mitral annular calcification. Trivial mitral valve regurgitation. Tricuspid Valve: The tricuspid valve is grossly normal. Tricuspid valve regurgitation is mild. Aortic Valve: The aortic valve has an indeterminant number of cusps. There is severe calcifcation of the aortic valve. There is moderate aortic valve annular calcification. Aortic valve regurgitation is trivial. Severe aortic stenosis is present. Aortic valve mean gradient measures 42.0 mmHg. Aortic valve peak gradient measures 72.9 mmHg. Aortic valve area, by VTI measures 0.77 cm. Pulmonic Valve: The pulmonic valve was grossly normal. Pulmonic valve regurgitation is trivial. Aorta: The aortic root is normal in size and structure. Venous: The inferior vena cava is normal in size with greater than 50% respiratory variability, suggesting right atrial pressure of 3 mmHg. IAS/Shunts: No atrial level shunt detected by color flow Doppler.  LEFT VENTRICLE PLAX 2D LVIDd:         3.01 cm  Diastology LVIDs:         2.00 cm  LV e' medial:    5.55 cm/s LV PW:         1.29 cm  LV E/e' medial:  16.9 LV IVS:        1.87 cm  LV e' lateral:   6.42 cm/s LVOT diam:     1.80 cm  LV E/e' lateral: 14.6 LV SV:  90 LV SV Index:   43 LVOT Area:     2.54 cm  RIGHT VENTRICLE RV S prime:     9.79 cm/s TAPSE (M-mode): 2.5 cm LEFT ATRIUM              Index       RIGHT ATRIUM           Index LA diam:        2.40 cm  1.16 cm/m  RA Area:     20.80 cm LA Vol (A2C):   84.1 ml  40.71 ml/m RA Volume:   70.20 ml  33.98 ml/m LA Vol (A4C):    117.0 ml 56.63 ml/m LA Biplane Vol: 100.0 ml 48.40 ml/m  AORTIC VALVE AV Area (Vmax):    0.75 cm AV Area (Vmean):   0.82 cm AV Area (VTI):     0.77 cm AV Vmax:           427.00 cm/s AV Vmean:          302.000 cm/s AV VTI:            1.170 m AV Peak Grad:      72.9 mmHg AV Mean Grad:      42.0 mmHg LVOT Vmax:         126.00 cm/s LVOT Vmean:        97.400 cm/s LVOT VTI:          0.352 m LVOT/AV VTI ratio: 0.30  AORTA Ao Root diam: 3.10 cm MITRAL VALVE                TRICUSPID VALVE MV Area (PHT): 2.05 cm     TR Peak grad:   31.6 mmHg MV Decel Time: 370 msec     TR Vmax:        281.00 cm/s MV E velocity: 93.60 cm/s MV A velocity: 111.00 cm/s  SHUNTS MV E/A ratio:  0.84         Systemic VTI:  0.35 m                             Systemic Diam: 1.80 cm Nona DellSamuel Mcdowell MD Electronically signed by Nona DellSamuel Mcdowell MD Signature Date/Time: 06/16/2020/11:37:58 AM    Final    Disposition   Pt is being discharged home today in good condition.  Follow-up Plans & Appointments    Follow-up Information    Janetta Horahompson, Kathryn R, PA-C Follow up on 07/09/2020.   Specialties: Cardiology, Radiology Why: at [address]am  Contact information: [address] [place] [id number] [phone]        Alleen BorneBartle, Bryan K, MD Follow up on 07/23/2020.   Specialty: Cardiothoracic Surgery Why: at 12:30 Contact information: [address] [place] [address] [phone]              Discharge Instructions    Call MD for:  difficulty breathing, headache or visual disturbances   Complete by: As directed    Call MD for:  extreme fatigue   Complete by: As directed    Call MD for:  hives   Complete by: As directed    Call MD for:  persistant dizziness or light-headedness   Complete by: As directed    Call MD for:  persistant nausea and vomiting   Complete by: As directed    Call MD for:  redness, tenderness, or signs of infection (pain, swelling, redness,  odor or green/yellow discharge  around incision site)   Complete by: As directed    Call MD for:  severe uncontrolled pain   Complete by: As directed    Call MD for:  temperature >100.4   Complete by: As directed    Diet - low sodium heart healthy   Complete by: As directed    Diet - low sodium heart healthy   Complete by: As directed    Discharge instructions   Complete by: As directed    No driving for 3 days. No lifting over 5 lbs for 1 week. No sexual activity for 1 week. Keep procedure site clean & dry. If you notice increased pain, swelling, bleeding or pus, call/return!  You may shower, but no soaking baths/hot tubs/pools for 1 week.   Increase activity slowly   Complete by: As directed    Increase activity slowly   Complete by: As directed       Discharge Medications   Allergies as of 07/05/2020   No Known Allergies     Medication List    TAKE these medications   ALPRAZolam 0.25 MG tablet Commonly known as: XANAX Take 0.25 mg by mouth at bedtime.   aspirin EC 81 MG tablet Take 81 mg by mouth daily.   D3 High Potency 50 MCG (2000 UT) Caps Generic drug: Cholecalciferol Take 2,000 Units by mouth daily.   furosemide 20 MG tablet Commonly known as: LASIX Take 20 mg by mouth daily.   ibuprofen 200 MG tablet Commonly known as: ADVIL Take 600 mg by mouth 2 (two) times daily as needed for moderate pain (shoulder pain.).   levothyroxine 137 MCG tablet Commonly known as: SYNTHROID Take 137 mcg by mouth daily before breakfast.   PARoxetine 20 MG tablet Commonly known as: PAXIL Take 20 mg by mouth daily.   Toviaz 8 MG Tb24 tablet Generic drug: fesoterodine Take 1 tablet (8 mg total) by mouth daily.        Outstanding Labs/Studies   Follow up imaging, appointments as previously scheduled  Duration of Discharge Encounter   Greater than 30 minutes including physician time.  Signed, Georgie Chard, NP 07/05/2020, 11:49 AM

## 2020-07-05 NOTE — Progress Notes (Signed)
Patient voiced concern with possible discharge home prior to 24 hour timeframe post cath. BUE used to get in & out of wheelchair at home. Reassured patient concern will be addressed. Information also given to oncoming RN during bedside shift report. Call light in reach. Will con't to monitor.

## 2020-07-05 NOTE — Plan of Care (Signed)
  Problem: Education: Goal: Knowledge of General Education information will improve Description: Including pain rating scale, medication(s)/side effects and non-pharmacologic comfort measures Outcome: Progressing   Problem: Education: Goal: Understanding of CV disease, CV risk reduction, and recovery process will improve Outcome: Progressing Goal: Individualized Educational Video(s) Outcome: Progressing   Problem: Activity: Goal: Ability to return to baseline activity level will improve Outcome: Progressing   Problem: Cardiovascular: Goal: Ability to achieve and maintain adequate cardiovascular perfusion will improve Outcome: Progressing Goal: Vascular access site(s) Level 0-1 will be maintained Outcome: Progressing   Problem: Health Behavior/Discharge Planning: Goal: Ability to safely manage health-related needs after discharge will improve Outcome: Progressing   

## 2020-07-07 ENCOUNTER — Telehealth: Payer: Self-pay

## 2020-07-07 ENCOUNTER — Encounter (HOSPITAL_COMMUNITY): Payer: Self-pay | Admitting: Cardiovascular Disease

## 2020-07-07 NOTE — Telephone Encounter (Signed)
  [facility]   MULTIDISCIPLINARY HEART VALVE TEAM  Pt was scheduled for pre TAVR PT eval on 10/13 but after further review of chart the pt is wheelchair bound and does not ambulate.  PT Eval cancelled and pt's daughter, [name], was made aware.

## 2020-07-08 ENCOUNTER — Ambulatory Visit (HOSPITAL_COMMUNITY): Payer: Medicare Other

## 2020-07-09 ENCOUNTER — Ambulatory Visit: Payer: Medicare Other | Admitting: Physical Therapy

## 2020-07-09 ENCOUNTER — Ambulatory Visit (HOSPITAL_COMMUNITY)
Admission: RE | Admit: 2020-07-09 | Discharge: 2020-07-09 | Disposition: A | Discharge status: Home / Self Care | Payer: Medicare Other | Source: Ambulatory Visit | Attending: Physician Assistant | Admitting: Physician Assistant

## 2020-07-09 ENCOUNTER — Ambulatory Visit (HOSPITAL_COMMUNITY): Payer: Medicare Other

## 2020-07-09 ENCOUNTER — Ambulatory Visit (HOSPITAL_BASED_OUTPATIENT_CLINIC_OR_DEPARTMENT_OTHER)
Admission: RE | Admit: 2020-07-09 | Discharge: 2020-07-09 | Disposition: A | Discharge status: Home / Self Care | Payer: Medicare Other | Source: Ambulatory Visit | Attending: Physician Assistant | Admitting: Physician Assistant

## 2020-07-09 ENCOUNTER — Encounter (HOSPITAL_COMMUNITY): Payer: Medicare Other

## 2020-07-09 ENCOUNTER — Encounter (HOSPITAL_COMMUNITY): Payer: Self-pay

## 2020-07-09 ENCOUNTER — Other Ambulatory Visit (HOSPITAL_COMMUNITY): Payer: Medicare Other

## 2020-07-09 ENCOUNTER — Other Ambulatory Visit: Payer: Self-pay

## 2020-07-09 DIAGNOSIS — I350 Nonrheumatic aortic (valve) stenosis: Secondary | ICD-10-CM

## 2020-07-09 DIAGNOSIS — Z01818 Encounter for other preprocedural examination: Secondary | ICD-10-CM | POA: Diagnosis not present

## 2020-07-09 MED ORDER — IOHEXOL 350 MG/ML SOLN
200.0000 mL | Freq: Once | INTRAVENOUS | Status: AC | PRN
  Administered 2020-07-09: 200 mL via INTRAVENOUS

## 2020-07-09 NOTE — Progress Notes (Signed)
Carotid artery duplex completed. Refer to "CV Proc" under chart review to view preliminary results.  07/09/2020 9:47 AM Eula Fried., MHA, RVT, RDCS, RDMS

## 2020-07-09 NOTE — Discharge Instructions (Signed)
Cardiac CT Angiogram A cardiac CT angiogram is a procedure to look at the heart and the area around the heart. It may be done to help find the cause of chest pains or other symptoms of heart disease. During this procedure, a substance called contrast dye is injected into the blood vessels in the area to be checked. A large X-ray machine, called a CT scanner, then takes detailed pictures of the heart and the surrounding area. The procedure is also sometimes called a coronary CT angiogram, coronary artery scanning, or CTA. A cardiac CT angiogram allows the health care provider to see how well blood is flowing to and from the heart. The health care provider will be able to see if there are any problems, such as:  Blockage or narrowing of the coronary arteries in the heart.  Fluid around the heart.  Signs of weakness or disease in the muscles, valves, and tissues of the heart. Tell a health care provider about:  Any allergies you have. This is especially important if you have had a previous allergic reaction to contrast dye.  All medicines you are taking, including vitamins, herbs, eye drops, creams, and over-the-counter medicines.  Any blood disorders you have.  Any surgeries you have had.  Any medical conditions you have.  Whether you are pregnant or may be pregnant.  Any anxiety disorders, chronic pain, or other conditions you have that may increase your stress or prevent you from lying still. What are the risks? Generally, this is a safe procedure. However, problems may occur, including:  Bleeding.  Infection.  Allergic reactions to medicines or dyes.  Damage to other structures or organs.  Kidney damage from the contrast dye that is used.  Increased risk of cancer from radiation exposure. This risk is low. Talk with your health care provider about: ? The risks and benefits of testing. ? How you can receive the lowest dose of radiation. What happens before the  procedure?  Wear comfortable clothing and remove any jewelry, glasses, dentures, and hearing aids.  Follow instructions from your health care provider about eating and drinking. This may include: ? For 12 hours before the procedure -- avoid caffeine. This includes tea, coffee, soda, energy drinks, and diet pills. Drink plenty of water or other fluids that do not have caffeine in them. Being well hydrated can prevent complications. ? For 4-6 hours before the procedure -- stop eating and drinking. The contrast dye can cause nausea, but this is less likely if your stomach is empty.  Ask your health care provider about changing or stopping your regular medicines. This is especially important if you are taking diabetes medicines, blood thinners, or medicines to treat problems with erections (erectile dysfunction). What happens during the procedure?   Hair on your chest may need to be removed so that small sticky patches called electrodes can be placed on your chest. These will transmit information that helps to monitor your heart during the procedure.  An IV will be inserted into one of your veins.  You might be given a medicine to control your heart rate during the procedure. This will help to ensure that good images are obtained.  You will be asked to lie on an exam table. This table will slide in and out of the CT machine during the procedure.  Contrast dye will be injected into the IV. You might feel warm, or you may get a metallic taste in your mouth.  You will be given a medicine called   nitroglycerin. This will relax or dilate the arteries in your heart.  The table that you are lying on will move into the CT machine tunnel for the scan.  The person running the machine will give you instructions while the scans are being done. You may be asked to: ? Keep your arms above your head. ? Hold your breath. ? Stay very still, even if the table is moving.  When the scanning is complete, you  will be moved out of the machine.  The IV will be removed. The procedure may vary among health care providers and hospitals. What can I expect after the procedure? After your procedure, it is common to have:  A metallic taste in your mouth from the contrast dye.  A feeling of warmth.  A headache from the nitroglycerin. Follow these instructions at home:  Take over-the-counter and prescription medicines only as told by your health care provider.  If you are told, drink enough fluid to keep your urine pale yellow. This will help to flush the contrast dye out of your body.  Most people can return to their normal activities right after the procedure. Ask your health care provider what activities are safe for you.  It is up to you to get the results of your procedure. Ask your health care provider, or the department that is doing the procedure, when your results will be ready.  Keep all follow-up visits as told by your health care provider. This is important. Contact a health care provider if:  You have any symptoms of allergy to the contrast dye. These include: ? Shortness of breath. ? Rash or hives. ? A racing heartbeat. Summary  A cardiac CT angiogram is a procedure to look at the heart and the area around the heart. It may be done to help find the cause of chest pains or other symptoms of heart disease.  During this procedure, a large X-ray machine, called a CT scanner, takes detailed pictures of the heart and the surrounding area after a contrast dye has been injected into blood vessels in the area.  Ask your health care provider about changing or stopping your regular medicines before the procedure. This is especially important if you are taking diabetes medicines, blood thinners, or medicines to treat erectile dysfunction.  If you are told, drink enough fluid to keep your urine pale yellow. This will help to flush the contrast dye out of your body. This information is not  intended to replace advice given to you by your health care provider. Make sure you discuss any questions you have with your health care provider. Document Revised: 05/09/2019 Document Reviewed: 05/09/2019 Else[employer] Patient Education  2020 [employer] Testing With IV Contrast Material IV contrast material is a fluid that is used with some imaging tests. It is injected into your body through a vein. Contrast material is used when your health care providers need a detailed look at organs, tissues, or blood vessels that may not show up with the standard test. The material may be used when an X-ray, an MRI, a CT scan, or an ultrasound is done. IV contrast material may be used for imaging tests that check:  Muscles, skin, and fat.  Breasts.  Brain.  Digestive tract.  Heart.  Organs such as the liver, kidneys, lungs, bladder, and many others.  Arteries and veins. Tell a health care provider about:  Any allergies you have, especially an allergy to contrast material.  All medicines you are taking, including   metformin, beta blockers, NSAIDs (such as ibuprofen), interleukin-2, vitamins, herbs, eye drops, creams, and over-the-counter medicines.  Any problems you or family members have had with the use of contrast material.  Any blood disorders you have, such as sickle cell anemia.  Any surgeries you have had.  Any medical conditions you have or have had, especially alcohol abuse, dehydration, asthma, or kidney, liver, or heart problems.  Whether you are pregnant or may be pregnant.  Whether you are breastfeeding. Most contrast materials are safe for use in breastfeeding women. What are the risks? Generally, this is a safe procedure. However, problems may occur, including:  Headache.  Itching, skin rash, and hives.  Nausea and vomiting.  Allergic reactions.  Wheezing or difficulty breathing.  Abnormal heart rate.  Changes in blood pressure.  Throat swelling.  Kidney  damage. What happens before the procedure? Medicines Ask your health care provider about:  Changing or stopping your regular medicines. This is especially important if you are taking diabetes medicines or blood thinners.  Taking medicines such as aspirin and ibuprofen. These medicines can thin your blood. Do not take these medicines unless your health care provider tells you to take them.  Taking over-the-counter medicines, vitamins, herbs, and supplements. If you are at risk of having a reaction to the IV contrast material, you may be asked to take medicine before the procedure to prevent a reaction. General instructions  Follow instructions from your health care provider about eating or drinking restrictions.  You may have an exam or lab tests to make sure that you can safely get IV contrast material.  Ask if you will be given a medicine to help you relax (sedative) during the procedure. If so, plan to have someone take you home from the hospital or clinic. What happens during the procedure?  You may be given a sedative to help you relax.  An IV will be inserted into one of your veins.  Contrast material will be injected into your IV.  You may feel warmth or flushing as the contrast material enters your bloodstream.  You may have a metallic taste in your mouth for a few minutes.  The needle may cause some discomfort and bruising.  After the contrast material is in your body, the imaging test will be done. The procedure may vary among health care providers and hospitals. What can I expect after the procedure?  The IV will be removed.  You may be taken to a recovery area if sedation medicines were used. Your blood pressure, heart rate, breathing rate, and blood oxygen level will be monitored until you leave the hospital or clinic. Follow these instructions at home:   Take over-the-counter and prescription medicines only as told by your health care provider. ? Your health  care provider may tell you to not take certain medicines for a couple of days after the procedure. This is especially important if you are taking diabetes medicines.  If you are told, drink enough fluid to keep your urine pale yellow. This will help to remove the contrast material out of your body.  Do not drive for 24 hours if you were given a sedative during your procedure.  It is up to you to get the results of your procedure. Ask your health care provider, or the department that is doing the procedure, when your results will be ready.  Keep all follow-up visits as told by your health care provider. This is important. Contact a health care provider if:    You have redness, swelling, or pain near your IV site. Get help right away if:  You have an abnormal heart rhythm.  You have trouble breathing.  You have: ? Chest pain. ? Pain in your back, neck, arm, jaw, or stomach. ? Nausea or sweating. ? Hives or a rash.  You start shaking and cannot stop. These symptoms may represent a serious problem that is an emergency. Do not wait to see if the symptoms will go away. Get medical help right away. Call your local emergency services (911 in the [place] Do not drive yourself to the hospital. Summary  IV contrast material may be used for imaging tests to help your health care providers see your organs and tissues more clearly.  Tell your health care provider if you are pregnant or may be pregnant.  During the procedure, you may feel warmth or flushing as the contrast material enters your bloodstream.  After the procedure, drink enough fluid to keep your urine pale yellow. This information is not intended to replace advice given to you by your health care provider. Make sure you discuss any questions you have with your health care provider. Document Revised: 11/30/2018 Document Reviewed: 11/30/2018 [employer] Patient Education  2020 [employer]  

## 2020-07-14 ENCOUNTER — Other Ambulatory Visit: Payer: Self-pay

## 2020-07-14 DIAGNOSIS — R9389 Abnormal findings on diagnostic imaging of other specified body structures: Secondary | ICD-10-CM

## 2020-07-14 DIAGNOSIS — K869 Disease of pancreas, unspecified: Secondary | ICD-10-CM

## 2020-07-16 ENCOUNTER — Telehealth: Payer: Self-pay | Admitting: Cardiology

## 2020-07-16 NOTE — Telephone Encounter (Signed)
Fwd to provider as FYI

## 2020-07-16 NOTE — Telephone Encounter (Signed)
Pt is being treated for a wound on her foot by Dr. Nolen Mu at [facility]. Dr. Nolen Mu told her to let her provider know.

## 2020-07-23 ENCOUNTER — Ambulatory Visit (HOSPITAL_COMMUNITY): Admission: RE | Admit: 2020-07-23 | Payer: Medicare Other | Source: Ambulatory Visit

## 2020-07-23 ENCOUNTER — Encounter: Payer: Medicare Other | Admitting: Surgery

## 2020-07-23 ENCOUNTER — Ambulatory Visit (HOSPITAL_COMMUNITY): Payer: Medicare Other

## 2020-07-30 ENCOUNTER — Encounter: Payer: Medicare Other | Admitting: Surgery

## 2020-08-04 ENCOUNTER — Ambulatory Visit (HOSPITAL_COMMUNITY): Admission: RE | Admit: 2020-08-04 | Payer: Medicare Other | Source: Ambulatory Visit

## 2020-08-06 ENCOUNTER — Encounter: Payer: Medicare Other | Admitting: Surgery

## 2020-08-22 ENCOUNTER — Ambulatory Visit (HOSPITAL_COMMUNITY): Payer: Medicare Other

## 2020-08-25 ENCOUNTER — Ambulatory Visit (HOSPITAL_COMMUNITY)
Admission: RE | Admit: 2020-08-25 | Discharge: 2020-08-25 | Disposition: A | Discharge status: Home / Self Care | Payer: Medicare Other | Source: Ambulatory Visit | Attending: Cardiovascular Disease | Admitting: Cardiovascular Disease

## 2020-08-25 DIAGNOSIS — K8689 Other specified diseases of pancreas: Secondary | ICD-10-CM | POA: Diagnosis not present

## 2020-08-25 DIAGNOSIS — K869 Disease of pancreas, unspecified: Secondary | ICD-10-CM | POA: Diagnosis not present

## 2020-08-25 DIAGNOSIS — R9389 Abnormal findings on diagnostic imaging of other specified body structures: Secondary | ICD-10-CM | POA: Insufficient documentation

## 2020-08-25 MED ORDER — COPPER CU 64 DOTATATE 1 MCI/ML IV SOLN
4.0000 | Freq: Once | INTRAVENOUS | Status: AC
  Administered 2020-08-25: 4.17 via INTRAVENOUS

## 2020-08-27 ENCOUNTER — Institutional Professional Consult (permissible substitution): Payer: Medicare Other | Admitting: Surgery

## 2020-08-27 ENCOUNTER — Encounter: Payer: Self-pay | Admitting: Surgery

## 2020-08-27 ENCOUNTER — Other Ambulatory Visit: Payer: Self-pay

## 2020-08-27 VITALS — BP 160/83 | HR 75 | Resp 18 | Ht 69.0 in

## 2020-08-27 DIAGNOSIS — I350 Nonrheumatic aortic (valve) stenosis: Secondary | ICD-10-CM

## 2020-08-27 NOTE — Progress Notes (Signed)
Patient ID: [name], female   DOB: May 15, 1933, 86 y.o.   MRN: [id number]  [facility]  MULTIDISCIPLINARY HEART VALVE CLINIC  CARDIOTHORACIC SURGERY CONSULTATION REPORT  Referring Provider is Jonelle Sidle, MD Primary Cardiologist is Nona Dell, MD PCP is Assunta Found, MD  Chief Complaint  Patient presents with  . Aortic Stenosis    TAVR eval  . Consult    HPI:  The patient is an 86 year old woman with a history of severe degenerative arthritis of the knees with very limited mobility and wheelchair-bound, hyperlipidemia, hypothyroidism, anxiety/depression, chronic bronchitis, coronary artery disease, and severe aortic stenosis who was referred to Dr. Clifton James by Dr. Diona Browner for further evaluation of her aortic stenosis and consideration of TAVR.  She has been followed for moderate aortic stenosis but her most recent echo in September 2021 showed a mean gradient across the aortic valve that increased to 42 mmHg with a peak gradient of 73 mmHg.  Aortic valve area of 0.75 cm with a dimensionless index of 0.3.  Left ventricular ejection fraction was 70% with moderate LVH.  Prior cardiac catheterization 2018 showed mild coronary disease.  She underwent repeat catheterization on 07/04/2020 which showed mild nonobstructive coronary disease.  The mean gradient across aortic valve was measured at 47.6 mmHg with a peak to peak gradient of 43 mmHg and an aortic valve area of 0.89 cm.  She is here today with her daughter.  She is wheelchair-bound due to severe degenerative disease of both knees and prior failed knee surgery.  She cannot stand up and bear weight.  She transfers herself from her wheelchair to chair or bed using her upper body.  She still lives alone at home with her dog and 2 cats in [place].  She is able to take care of her house and does her own cooking.  She reports her only symptom being occasional episodes of feeling generally weak that last about  10 to 15 seconds and resolve.  She said that this is not really dizziness.  She denies any chest pain or pressure.  She denies any shortness of breath.  She denies fatigue.  She denies orthopnea and PND.  Her daughter said that her main complaints are related to her arthritis, incontinence, and difficulty sleeping at night.  She has not noticed a decrease in her functional level.  Past Medical History:  Diagnosis Date  . Anxiety   . Aortic stenosis   . Arthritis   . CAD (coronary artery disease)    Mild at cardiac catheterization 2018  . Chronic bronchitis   . Constipation   . Depression   . Hyperlipidemia   . Hypothyroidism     Past Surgical History:  Procedure Laterality Date  . CARDIAC CATHETERIZATION  07/04/2020  . CHOLECYSTECTOMY N/A 02/15/2017   Procedure: LAPAROSCOPIC CHOLECYSTECTOMY;  Surgeon: Axel Filler, MD;  Location: [facility];  Service: General;  Laterality: N/A;  . CORONARY ANGIOGRAPHY N/A 02/14/2017   Procedure: Coronary Angiography;  Surgeon: Yvonne Kendall, MD;  Location: [facility] INVASIVE CV LAB;  Service: Cardiovascular;  Laterality: N/A;  . EYE SURGERY  2009   bilateral  . HEMATOMA EVACUATION  06/04/2011   Procedure: EVACUATION HEMATOMA;  Surgeon: Fuller Canada, MD;  Location: [facility] ORS;  Service: Orthopedics;  Laterality: Right;  evacuation of seroma  . JOINT REPLACEMENT  05/2010   left total knee Dr. Romeo Apple  . Left hip replacement @ [facility] '98    . Left total knee    . PATELLAR  TENDON REPAIR  06/04/2011   Procedure: PATELLA TENDON REPAIR;  Surgeon: Fuller Canada, MD;  Location: [facility] ORS;  Service: Orthopedics;  Laterality: Right;  . PATELLAR TENDON REPAIR  08/30/2011   Procedure: PATELLA TENDON REPAIR;  Surgeon: Fuller Canada, MD;  Location: [facility] ORS;  Service: Orthopedics;  Laterality: Right;  Allograft Reconstrucation Right Patella Tendon  . REPLACEMENT TOTAL KNEE BILATERAL    . RIGHT/LEFT HEART CATH AND CORONARY ANGIOGRAPHY N/A 07/04/2020   Procedure: RIGHT/LEFT  HEART CATH AND CORONARY ANGIOGRAPHY;  Surgeon: Kathleene Hazel, MD;  Location: [facility] INVASIVE CV LAB;  Service: Cardiovascular;  Laterality: N/A;  . TOTAL HIP ARTHROPLASTY    . TOTAL KNEE ARTHROPLASTY  05/03/2011   Procedure: TOTAL KNEE ARTHROPLASTY;  Surgeon: Fuller Canada, MD;  Location: [facility];  Service: Orthopedics;  Laterality: Right;  With DePuy  . TOTAL THYROIDECTOMY  March 2011   Dr. Suszanne Conners @ [facility]  . TUBAL LIGATION      Family History  Problem Relation Age of Onset  . Colon cancer Mother   . Blindness Sister   . Anesthesia problems Neg Hx   . Hypotension Neg Hx   . Malignant hyperthermia Neg Hx   . Pseudochol deficiency Neg Hx     Social History   Socioeconomic History  . Marital status: Widowed    Spouse name: Not on file  . Number of children: 4  . Years of education: Not on file  . Highest education level: Not on file  Occupational History  . Occupation: Retired-Office work  Tobacco Use  . Smoking status: Former Smoker    Types: Cigarettes    Quit date: 09/27/1978    Years since quitting: 41.9  . Smokeless tobacco: Never Used  Vaping Use  . Vaping Use: Never used  Substance and Sexual Activity  . Alcohol use: No  . Drug use: No  . Sexual activity: Not Currently  Other Topics Concern  . Not on file  Social History Narrative   ** Merged History Encounter **       Social Determinants of Health   Financial Resource Strain:   . Difficulty of Paying Living Expenses: Not on file  Food Insecurity:   . Worried About Programme researcher, broadcasting/film/video in the Last Year: Not on file  . Ran Out of Food in the Last Year: Not on file  Transportation Needs:   . Lack of Transportation (Medical): Not on file  . Lack of Transportation (Non-Medical): Not on file  Physical Activity:   . Days of Exercise per Week: Not on file  . Minutes of Exercise per Session: Not on file  Stress:   . Feeling of Stress : Not on file  Social Connections:   . Frequency of Communication with  Friends and Family: Not on file  . Frequency of Social Gatherings with Friends and Family: Not on file  . Attends Religious Services: Not on file  . Active Member of Clubs or Organizations: Not on file  . Attends Banker Meetings: Not on file  . Marital Status: Not on file  Intimate Partner Violence:   . Fear of Current or Ex-Partner: Not on file  . Emotionally Abused: Not on file  . Physically Abused: Not on file  . Sexually Abused: Not on file    Current Outpatient Medications  Medication Sig Dispense Refill  . ALPRAZolam (XANAX) 0.25 MG tablet Take 0.25 mg by mouth at bedtime.   4  . aspirin EC 81 MG tablet Take  81 mg by mouth daily.    . Cholecalciferol (D3 HIGH POTENCY) 50 MCG (2000 UT) CAPS Take 2,000 Units by mouth daily.    . furosemide (LASIX) 20 MG tablet Take 20 mg by mouth daily.   11  . ibuprofen (ADVIL,MOTRIN) 200 MG tablet Take 600 mg by mouth 2 (two) times daily as needed for moderate pain (shoulder pain.).     Marland Kitchen levothyroxine (SYNTHROID) 137 MCG tablet Take 137 mcg by mouth daily before breakfast.    . PARoxetine (PAXIL) 20 MG tablet Take 20 mg by mouth daily.     . TOVIAZ 8 MG TB24 tablet Take 1 tablet (8 mg total) by mouth daily. 30 tablet 0   No current facility-administered medications for this visit.    No Known Allergies    Review of Systems:   General:  normal appetite, normal energy for her, no weight gain, + weight loss, no fever  Cardiac:  no chest pain with exertion, no chest pain at rest, noSOB with  exertion, no resting SOB, no PND, no orthopnea, no palpitations, no arrhythmia, no atrial fibrillation, no LE edema, no dizzy spells, no syncope  Respiratory:  no shortness of breath, no home oxygen, no productive cough, no dry cough, no bronchitis, no wheezing, no hemoptysis, no asthma, no pain with inspiration or cough, no sleep apnea, no CPAP at night  GI:   no difficulty swallowing, no reflux, no frequent heartburn, no hiatal hernia, no  abdominal pain, no constipation, no diarrhea, no hematochezia, no hematemesis, no melena  GU:   no dysuria,  no frequency, no urinary tract infection, no hematuria, no kidney stones, no kidney disease, + incontinence  Vascular:  no pain suggestive of claudication, no pain in feet, no leg cramps, no varicose veins, no DVT, no non-healing foot ulcer  Neuro:   no stroke, no TIA's, no seizures, no headaches, no temporary blindness one eye,  no slurred speech, no peripheral neuropathy, no chronic pain, normal memory/cognitive dysfunction  Musculoskeletal: + arthritis, + joint swelling, NO myalgias, cannot walk or bear weight, + reduced mobility, wheel chair bound  Skin:   no rash, no itching, no skin infections, no pressure sores or ulcerations  Psych:   + anxiety, + depression, no nervousness, no unusual recent stress  Eyes:   no blurry vision, no floaters, no recent vision changes, + wears glasses or contact  ENT:   no hearing loss, no loose or painful teeth, + dentures  Hematologic:  no easy bruising, no abnormal bleeding, no clotting disorder, no frequent epistaxis  Endocrine:  no diabetes, does not check CBG's at home        Physical Exam:   BP (!) 160/83 (BP Location: Left Arm, Patient Position: Sitting)   Pulse 75   Resp 18   Ht 5\' 9"  (1.753 m)   SpO2 96% Comment: RA with mask on  BMI 34.84 kg/m   General:  Elderly, wheel chair bound   HEENT:  Unremarkable, NCAT,PERLA, EOMI,   Neck:   no JVD, no bruits, no adenopathy   Chest:   clear to auscultation, symmetrical breath sounds, no wheezes, no rhonchi   CV:   RRR, grade lll/VI crescendo/decrescendo murmur heard best at RSB,  no diastolic murmur  Abdomen:  soft, non-tender, no masses, erythema both groins that looks like yeast.  Extremities:  warm, well-perfused, pulses palpable at ankle, no LE edema, deformation of both knees from prior knee surgery.  Rectal/GU  Deferred  Neuro:   Grossly  non-focal and symmetrical  throughout  Skin:   Clean and dry, no rashes, no breakdown   Diagnostic Tests:  ECHOCARDIOGRAM REPORT       Patient Name:  SATSUKI ZILLMER Date of Exam: 06/16/2020  Medical Rec #: [id number]      Height:    69.0 in  Accession #:  [id number]     Weight:    200.0 lb  Date of Birth: June 25, 1933      BSA:     2.066 m  Patient Age:  87 years      BP:      164/81 mmHg  Patient Gender: F          HR:      71 bpm.  Exam Location: [facility]   Procedure: 2D Echo, Cardiac Doppler and Color Doppler   Indications:  I35.0 (ICD-10-CM) - Nonrheumatic aortic valve stenosis    History:    Patient has prior history of Echocardiogram examinations,  most         recent 12/03/2019. Risk Factors:Hypertension and  Dyslipidemia.         NSTEMI (non-ST elevated myocardial infarction),Aortic  stenosis         (From Hx).    Sonographer:  Celesta Gentile RCS  Referring Phys: [id number] SAMUEL G MCDOWELL   IMPRESSIONS    1. Left ventricular ejection fraction, by estimation, is 65 to 70%. The  left ventricle has normal function. The left ventricle has no regional  wall motion abnormalities. There is moderate left ventricular hypertrophy.  Left ventricular diastolic  parameters are consistent with Grade I diastolic dysfunction (impaired  relaxation).  2. Right ventricular systolic function is normal. The right ventricular  size is normal. There is normal pulmonary artery systolic pressure. The  estimated right ventricular systolic pressure is 34.6 mmHg.  3. Left atrial size was severely dilated.  4. Right atrial size was upper normal.  5. The mitral valve is abnormal. Trivial mitral valve regurgitation.  Moderate mitral annular calcification.  6. The aortic valve has an indeterminant number of cusps. There is severe  calcifcation of the aortic valve. Aortic valve regurgitation is trivial.  Severe aortic  valve stenosis. Aortic valve mean gradient measures 42.0  mmHg. Aortic valve Vmax measures  4.27 m/s. Dimentionless index 0.30.  7. The inferior vena cava is normal in size with greater than 50%  respiratory variability, suggesting right atrial pressure of 3 mmHg.   FINDINGS  Left Ventricle: Left ventricular ejection fraction, by estimation, is 65  to 70%. The left ventricle has normal function. The left ventricle has no  regional wall motion abnormalities. The left ventricular internal cavity  size was normal in size. There is  moderate left ventricular hypertrophy. Left ventricular diastolic  parameters are consistent with Grade I diastolic dysfunction (impaired  relaxation).   Right Ventricle: The right ventricular size is normal. No increase in  right ventricular wall thickness. Right ventricular systolic function is  normal. There is normal pulmonary artery systolic pressure. The tricuspid  regurgitant velocity is 2.81 m/s, and  with an assumed right atrial pressure of 3 mmHg, the estimated right  ventricular systolic pressure is 34.6 mmHg.   Left Atrium: Left atrial size was severely dilated.   Right Atrium: Right atrial size was upper normal.   Pericardium: There is no evidence of pericardial effusion.   Mitral Valve: The mitral valve is abnormal. Moderate mitral annular  calcification. Trivial mitral valve regurgitation.   Tricuspid Valve: The tricuspid valve is  grossly normal. Tricuspid valve  regurgitation is mild.   Aortic Valve: The aortic valve has an indeterminant number of cusps. There  is severe calcifcation of the aortic valve. There is moderate aortic valve  annular calcification. Aortic valve regurgitation is trivial. Severe  aortic stenosis is present. Aortic  valve mean gradient measures 42.0 mmHg. Aortic valve peak gradient  measures 72.9 mmHg. Aortic valve area, by VTI measures 0.77 cm.   Pulmonic Valve: The pulmonic valve was grossly normal.  Pulmonic valve  regurgitation is trivial.   Aorta: The aortic root is normal in size and structure.   Venous: The inferior vena cava is normal in size with greater than 50%  respiratory variability, suggesting right atrial pressure of 3 mmHg.   IAS/Shunts: No atrial level shunt detected by color flow Doppler.     LEFT VENTRICLE  PLAX 2D  LVIDd:     3.01 cm Diastology  LVIDs:     2.00 cm LV e' medial:  5.55 cm/s  LV PW:     1.29 cm LV E/e' medial: 16.9  LV IVS:    1.87 cm LV e' lateral:  6.42 cm/s  LVOT diam:   1.80 cm LV E/e' lateral: 14.6  LV SV:     90  LV SV Index:  43  LVOT Area:   2.54 cm     RIGHT VENTRICLE  RV S prime:   9.79 cm/s  TAPSE (M-mode): 2.5 cm   LEFT ATRIUM       Index    RIGHT ATRIUM      Index  LA diam:    2.40 cm 1.16 cm/m RA Area:   20.80 cm  LA Vol (A2C):  84.1 ml 40.71 ml/m RA Volume:  70.20 ml 33.98 ml/m  LA Vol (A4C):  117.0 ml 56.63 ml/m  LA Biplane Vol: 100.0 ml 48.40 ml/m  AORTIC VALVE  AV Area (Vmax):  0.75 cm  AV Area (Vmean):  0.82 cm  AV Area (VTI):   0.77 cm  AV Vmax:      427.00 cm/s  AV Vmean:     302.000 cm/s  AV VTI:      1.170 m  AV Peak Grad:   72.9 mmHg  AV Mean Grad:   42.0 mmHg  LVOT Vmax:     126.00 cm/s  LVOT Vmean:    97.400 cm/s  LVOT VTI:     0.352 m  LVOT/AV VTI ratio: 0.30    AORTA  Ao Root diam: 3.10 cm   MITRAL VALVE        TRICUSPID VALVE  MV Area (PHT): 2.05 cm   TR Peak grad:  31.6 mmHg  MV Decel Time: 370 msec   TR Vmax:    281.00 cm/s  MV E velocity: 93.60 cm/s  MV A velocity: 111.00 cm/s SHUNTS  MV E/A ratio: 0.84     Systemic VTI: 0.35 m               Systemic Diam: 1.80 cm   Nona Dell MD  Electronically signed by Nona Dell MD  Signature Date/Time: 06/16/2020/11:37:58 AM    Physicians  Panel Physicians Referring Physician Case  Authorizing Physician  Kathleene Hazel, MD (Primary)    Procedures  RIGHT/LEFT HEART CATH AND CORONARY ANGIOGRAPHY  Conclusion    Mid LAD lesion is 25% stenosed.   1. Mild non-obstructive CAD 2. Severe aortic stenosis (mean gradient 47.6 mmHg, peak to peak gradient 43 mmHg, AVA 0.89 cm2).  Recommendations: Continue planning for TAVR  Recommendations  Antiplatelet/Anticoag Continue planning for TAVR  Indications  Severe aortic stenosis [I35.0 (ICD-10-CM)]  Procedural Details  Technical Details Indication: Severe aortic stenosis  Procedure: The risks, benefits, complications, treatment options, and expected outcomes were discussed with the patient. The patient and/or family concurred with the proposed plan, giving informed consent. The patient was brought to the cath lab after IV hydration was given. The patient was sedated with Versed and Fentanyl. A 7 French sheath was placed in the right antecubital vein with u/s guidance. Right heart catheterization performed with a balloon tipped catheter. The right wrist was prepped and draped in a sterile fashion. 1% lidocaine was used for local anesthesia. Using the modified Seldinger access technique, a 5 French sheath was placed in the right radial artery. 3 mg Verapamil was given through the sheath. 5000 units IV heparin was given. I was unable to manipulate the catheters due to severe tortuosity in the innominate artery. The sheath was removed and a 75 cm Destination Slender sheath 6Fr was passed over the wire into the ascending aorta. Standard diagnostic catheters were used to perform selective coronary angiography. The aortic valve was crossed with an AL-1 catheter and a straight wire. The sheath was removed from the right radial artery and a Terumo hemostasis band was applied at the arteriotomy site on the right wrist.    Estimated blood loss <50 mL.   During this procedure medications were administered to achieve and maintain  moderate conscious sedation while the patient's heart rate, blood pressure, and oxygen saturation were continuously monitored and I was present face-to-face 100% of this time.  Medications (Filter: Administrations occurring from 1103 to 1229 on 07/04/20) (important) Continuous medications are totaled by the amount administered until 07/04/20 1229.  Heparin (Porcine) in NaCl 1000-0.9 UT/500ML-% SOLN (mL) Total volume:  1,000 mL Date/Time  Rate/Dose/Volume Action  07/04/20 1103  500 mL Given  1103  500 mL Given    midazolam (VERSED) injection (mg) Total dose:  1 mg Date/Time  Rate/Dose/Volume Action  07/04/20 1119  1 mg Given    fentaNYL (SUBLIMAZE) injection (mcg) Total dose:  25 mcg Date/Time  Rate/Dose/Volume Action  07/04/20 1119  25 mcg Given    lidocaine (PF) (XYLOCAINE) 1 % injection (mL) Total volume:  4 mL Date/Time  Rate/Dose/Volume Action  07/04/20 1130  2 mL Given  1149  2 mL Given    Radial Cocktail/Verapamil only (mL) Total volume:  20 mL Date/Time  Rate/Dose/Volume Action  07/04/20 1151  10 mL Given  1158  10 mL Given    heparin sodium (porcine) injection (Units) Total dose:  5,000 Units Date/Time  Rate/Dose/Volume Action  07/04/20 1154  5,000 Units Given    hydrALAZINE (APRESOLINE) injection (mg) Total dose:  10 mg Date/Time  Rate/Dose/Volume Action  07/04/20 1221  10 mg Given    iohexol (OMNIPAQUE) 350 MG/ML injection (mL) Total volume:  65 mL Date/Time  Rate/Dose/Volume Action  07/04/20 1222  65 mL Given    Sedation Time  Sedation Time Physician-1: 59 minutes 11 seconds  Contrast  Medication Name Total Dose  iohexol (OMNIPAQUE) 350 MG/ML injection 65 mL    Radiation/Fluoro  Fluoro time: 13.2 (min) DAP: 16.1 (Gycm2) Cumulative Air Kerma: 265.7 (mGy)  Complications  Complications documented before study signed (07/04/2020 12:32 PM)   RIGHT/LEFT HEART CATH AND CORONARY ANGIOGRAPHY  None Documented by Kathleene Hazel, MD 07/04/2020  12:25 PM  Date Found: 07/04/2020  Time Range: Gershon Mussel  Coronary Findings  Diagnostic Dominance: Left Left Main  Vessel is large. Vessel is angiographically normal.  Left Anterior Descending  Mid LAD lesion is 25% stenosed.  First Diagonal Branch  Vessel is small in size.  Second Diagonal Branch  Vessel is moderate in size.  Left Circumflex  Vessel is large. Vessel is angiographically normal.  First Obtuse Marginal Branch  Vessel is large in size. The vessel is tortuous.  Second Obtuse Marginal Branch  Vessel is moderate in size. The vessel is tortuous.  Third Obtuse Marginal Branch  Vessel is small in size.  Fourth Obtuse Marginal Branch  Vessel is moderate in size.  Left Posterior Descending Artery  Vessel is moderate in size.  Right Coronary Artery  Vessel is moderate in size. Vessel is angiographically normal.  Intervention  No interventions have been documented. Coronary Diagrams  Diagnostic Dominance: Left  Intervention  Implants   No implant documentation for this case.  Syngo Images  Show images for CARDIAC CATHETERIZATION Images on Long Term Storage  Show images for Brennah, Quraishi to Procedure Log  Procedure Log    Hemo Data   Most Recent Value  Fick Cardiac Output 6.61 L/min  Fick Cardiac Output Index 2.92 (L/min)/BSA  Aortic Mean Gradient 47.6 mmHg  Aortic Peak Gradient 43 mmHg  Aortic Valve Area 0.89  Aortic Value Area Index 0.4 cm2/BSA  RA A Wave 3 mmHg  RA V Wave 0 mmHg  RA Mean 1 mmHg  RV Systolic Pressure 26 mmHg  RV Diastolic Pressure 1 mmHg  RV EDP 2 mmHg  PA Systolic Pressure 27 mmHg  PA Diastolic Pressure 10 mmHg  PA Mean 17 mmHg  PW A Wave 7 mmHg  PW V Wave 5 mmHg  PW Mean 6 mmHg  AO Systolic Pressure 184 mmHg  AO Diastolic Pressure 78 mmHg  AO Mean 120 mmHg  LV Systolic Pressure 225 mmHg  LV Diastolic Pressure 7 mmHg  LV EDP 13 mmHg  AOp Systolic Pressure 179 mmHg  AOp Diastolic Pressure 65  mmHg  AOp Mean Pressure 107 mmHg  LVp Systolic Pressure 222 mmHg  LVp Diastolic Pressure 8 mmHg  LVp EDP Pressure 15 mmHg  QP/QS 1  TPVR Index 5.83 HRUI  TSVR Index 41.13 HRUI  PVR SVR Ratio 0.09  TPVR/TSVR Ratio 0.14   Addendum    ADDENDUM REPORT: 07/09/2020 15:36  EXAM: OVER-READ INTERPRETATION  CT CHEST  The following report is an over-read performed by radiologist Dr. Royal [facility] on 07/09/2020. This over-read does not include interpretation of cardiac or coronary anatomy or pathology. The coronary calcium score and cardiac CTA interpretation by the cardiologist is attached.  COMPARISON:  Chest CTA 02/12/2017.  FINDINGS: Extracardiac findings will be described separately under dictation for contemporaneously obtained CTA chest, abdomen and pelvis.  IMPRESSION: Please see separate dictation for contemporaneously obtained CTA chest, abdomen and pelvis dated 07/09/2020 for full description of relevant extracardiac findings.   Electronically Signed   By: Trudie Reed M.D.   On: 07/09/2020 15:36   Addended by Florencia Reasons, MD on 07/09/2020 3:38 PM  Study Result  Addenda  ADDENDUM REPORT: 07/09/2020 15:36  EXAM: OVER-READ INTERPRETATION  CT CHEST  The following report is an over-read performed by radiologist Dr. Royal Piedra [facility] on 07/09/2020. This over-read does not include interpretation of cardiac or coronary anatomy or pathology. The coronary calcium score and cardiac CTA interpretation by the cardiologist is attached.  COMPARISON:  Chest CTA 02/12/2017.  FINDINGS: Extracardiac findings will be described separately under dictation for contemporaneously obtained CTA chest, abdomen and pelvis.  IMPRESSION: Please see separate dictation for contemporaneously obtained CTA chest, abdomen and pelvis dated 07/09/2020 for full description of relevant extracardiac  findings.   Electronically Signed   By: Trudie Reed M.D.   On: 07/09/2020 15:36   Signed by Florencia Reasons, MD on 07/09/2020 3:38 PM  Narrative & Impression  CLINICAL DATA:  Aortic Stenosis  EXAM: Cardiac TAVR CT  TECHNIQUE: The patient was scanned on a Siemens Force 192 slice scanner. A 120 kV retrospective scan was triggered in the ascending thoracic aorta at 140 HU's. Gantry rotation speed was 250 msecs and collimation was .6 mm. No beta blockade or nitro were given. The 3D data set was reconstructed in 5% intervals of the R-R cycle. Systolic and diastolic phases were analyzed on a dedicated work station using MPR, MIP and VRT modes. The patient received 80 cc of contrast.  FINDINGS: Aortic Valve: Tri leaflet heavily calcified with restricted leaflet motion Heaviest calcification is on the non coronary cusp AV calcium score 5000  Aorta: No aneurysm with normal arch vessels and moderate calcific atherosclerosis  Sino-tubular Junction: 28 mm  Ascending Thoracic Aorta: 31 mm  Aortic Arch: 24 mm  Descending Thoracic Aorta: 21 mm  Sinus of Valsalva Measurements:  Non-coronary: 29.9 mm  Right - coronary: 30.8 mm  Left -   coronary: 29.7 mm  Coronary Artery Height above Annulus:  Left Main: 14 mm above annulus  Right Coronary: 16.7 mm above annulus  Virtual Basal Annulus Measurements:  Maximum / Minimum Diameter: 26.6 mm x 24.1 mm  Perimeter: 81.5 mm  Area: 506 mm2  Coronary Arteries: Sufficient height above annulus for deployment  Optimum Fluoroscopic Angle for Delivery: LAO 12 Cranial 5 degrees  IMPRESSION: 1. Trileaflet AV with annular area of 506 mm 2 suitable for a 26 mm Sapien 3 valve  2.  Coronary arteries sufficient height above annulus for deployment  3. Optimum angiographic angle for deployment LAO 12 Cranial 5 degrees  4.  Normal aortic root 3.1 cm  5.  Dilated MPA 3.0 cm suggesting elevated PA  pressures  6.  Moderate posterior mitral annular calcification  7.  Heavily calcified AV with score of 5000  Charlton Haws  Electronically Signed: By: Charlton Haws M.D. On: 07/09/2020 12:33    Study Result  Narrative & Impression  CLINICAL DATA:  86 year old female with history of severe aortic stenosis. Preprocedural study prior to potential transcatheter aortic valve replacement (TAVR) procedure.  EXAM: CT ANGIOGRAPHY CHEST, ABDOMEN AND PELVIS  TECHNIQUE: Multidetector CT imaging through the chest, abdomen and pelvis was performed using the standard protocol during bolus administration of intravenous contrast. Multiplanar reconstructed images and MIPs were obtained and reviewed to evaluate the vascular anatomy.  CONTRAST:  OMNIPAQUE IOHEXOL 350 MG/ML SOLN  COMPARISON:  CT the chest and abdomen 02/12/2017.  FINDINGS: CTA CHEST FINDINGS  Cardiovascular: Heart size is borderline enlarged. There is no significant pericardial fluid, thickening or pericardial calcification. There is aortic atherosclerosis, as well as atherosclerosis of the great vessels of the mediastinum and the coronary arteries, including calcified atherosclerotic plaque in the left main and left anterior descending coronary arteries. Severe thickening and calcification of the aortic valve. Moderate calcifications of the mitral annulus. Dilatation of the pulmonic trunk (3.9 cm in diameter), concerning for pulmonary arterial hypertension.  Mediastinum/Lymph Nodes: No pathologically enlarged mediastinal or hilar lymph nodes. Large hiatal hernia. No axillary  lymphadenopathy.  Lungs/Pleura: 7 x 3 mm (mean diameter 5 mm) right lower lobe pulmonary nodule (axial image 65 of series 7), stable compared to prior study from 02/12/2017, considered definitively benign. No other larger more suspicious appearing pulmonary nodules or masses are noted. No acute consolidative airspace disease. No  pleural effusions. Azygos lobe (normal anatomical variant) incidentally noted.  Musculoskeletal/Soft Tissues: There are no aggressive appearing lytic or blastic lesions noted in the visualized portions of the skeleton.  CTA ABDOMEN AND PELVIS FINDINGS  Hepatobiliary: No suspicious cystic or solid hepatic lesions. No intra or extrahepatic biliary ductal dilatation. Status post cholecystectomy.  Pancreas: Small hypervascular lesion in the head of the pancreas (axial image 105 of series 6 and coronal image 64 of series 8) measuring 1.3 x 0.9 x 0.9 cm, concerning for possible neuroendocrine neoplasm or metastatic lesion. No pancreatic ductal dilatation. No pancreatic or peripancreatic fluid collections or inflammatory changes.  Spleen: Unremarkable.  Adrenals/Urinary Tract: 1.3 x 0.9 cm right adrenal nodule, similar in retrospect to the prior study, likely to represent a small benign lesions such as a adrenal adenoma. Left adrenal gland and bilateral kidneys are unremarkable in appearance. No hydroureteronephrosis. Urinary bladder is normal in appearance.  Stomach/Bowel: The intra-abdominal portion of the stomach is unremarkable. There is no pathologic dilatation of small bowel or colon. The appendix is not confidently identified and may be surgically absent. Regardless, there are no inflammatory changes noted adjacent to the cecum to suggest the presence of an acute appendicitis at this time.  Vascular/Lymphatic: Aortic atherosclerosis, without evidence of aneurysm or dissection in the abdominal or pelvic vasculature. No lymphadenopathy noted in the abdomen or pelvis.  Reproductive: Uterus and ovaries are unremarkable in appearance.  Other: No significant volume of ascites.  No pneumoperitoneum.  Musculoskeletal: Status post left hip arthroplasty. There are no aggressive appearing lytic or blastic lesions noted in the visualized portions of the  skeleton.  VASCULAR MEASUREMENTS PERTINENT TO TAVR:  AORTA:  Minimal Aortic Diameter-14 x 13 mm  Severity of Aortic Calcification-moderate  RIGHT PELVIS:  Right Common Iliac Artery -  Minimal Diameter-8.9 x 9.1 mm  Tortuosity-mild  Calcification-mild  Right External Iliac Artery -  Minimal Diameter-6.8 x 6.6 mm  Tortuosity-moderate  Calcification-mild  Right Common Femoral Artery -  Minimal Diameter-7.1 x 6.8 mm  Tortuosity-mild  Calcification-mild  LEFT PELVIS:  Left Common Iliac Artery -  Minimal Diameter-9.3 x 7.6 mm  Tortuosity-moderate  Calcification-mild  Left External Iliac Artery -  Minimal Diameter-7.1 x 5.6 mm  Tortuosity-moderate  Calcification-mild  Left Common Femoral Artery -  Minimal Diameter-8.0 x 5.7 mm  Tortuosity-mild  Calcification-mild  Review of the MIP images confirms the above findings.  IMPRESSION: 1. Vascular findings and measurements pertinent to potential TAVR procedure, as detailed above. 2. Severe thickening calcification of the aortic valve, compatible with reported clinical history of severe aortic stenosis. 3. Aortic atherosclerosis, in addition to left main and left anterior descending coronary artery disease. Please note that although the presence of coronary artery calcium documents the presence of coronary artery disease, the severity of this disease and any potential stenosis cannot be assessed on this non-gated CT examination. Assessment for potential risk factor modification, dietary therapy or pharmacologic therapy may be warranted, if clinically indicated. 4. Dilatation of the pulmonic trunk (3.9 cm in diameter), concerning for pulmonary arterial hypertension. 5. 1.3 x 0.9 x 0.9 cm hypervascular lesion in the head of the pancreas highly concerning for small neuroendocrine neoplasm or metastatic lesion. Further clinical evaluation  is recommended. If clinically  appropriate, further evaluation with nonemergent Dotatate PET-CT could be considered. 6. 1.3 x 0.9 cm right adrenal nodule, stable compared to the prior examination, incompletely characterize, but statistically likely to represent a tiny adrenal adenoma. 7. Additional incidental findings, as above.   Electronically Signed   By: Trudie Reedaniel  Entrikin M.D.   On: 07/09/2020 16:08    STS Risk Score: Risk of Mortality: 2.012% Renal Failure: 1.844% Permanent Stroke: 1.209% Prolonged Ventilation: 7.751% DSW Infection: 0.153% Reoperation: 3.011% Morbidity or Mortality: 12.665% Short Length of Stay: 23.554% Long Length of Stay: 6.781%  Impression:  This 86 year old woman has stage D, severe, symptomatic aortic stenosis with occasional brief episodes of generalized weakness while sitting in a wheelchair.  She said that these episodes resolve quickly I do not keep her from her general activities.  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  I have personally reviewed her 2D echocardiogram, cardiac shows mild nonobstructive coronary disease, and CTA studies.  Her echocardiogram shows a severely calcified aortic valve with a mean gradient of 42 mmHg consistent with severe aortic stenosis.  Left ventricular systolic function is normal.  Cardiac catheterization shows mild nonobstructive coronary disease.  The mean gradient across aortic valve measured at 43 mmHg consistent with severe aortic stenosis.  I spent considerable time the patient and her daughter discussing her symptoms.  It sounds like she is not limited in her daily activity.  These intermittent episodes of generalized weakness certainly could be due to her aortic valve but they are short-lived.  Her main concerns certainly related to her arthritis, incontinence, and difficulty sleeping.  While I think transcatheter aortic valve replacement is a reasonable option  for treating her severe aortic stenosis I am not sure that it will improve her level of functioning and certainly could decrease her level of functioning if she has any complications.  She may get her TAVR and recover uneventfully but there is also a small but significant risk of complications. This might take her from living at home independently to requiring 24 hour care.  I think it is probably best to hold off on TAVR at this time and continue following her.  If she develops clear worsening symptoms that threaten her independence then reconsideration of TAVR could be entertained.  There is certainly a window of opportunity for treating an 86 year old patient but in her particular case any postoperative complication or alteration of mental status could prevent her from returning home independently. I discussed this with her and her daughter and they are in agreement to hold off on TAVR for now and continue followup.   Her abdominal and pelvic CTA also showed a 1.3 x 0.9 x 0.9 cm hypervascular lesion in the head of the pancreas highly concerning for small neuroendocrine neoplasm or metastatic lesion.  She had a PET scan done on 08/25/2020 to evaluate this further and this showed intense radiotracer activity in this lesion with an SUV max of 18.6.  In retrospect there was an enhancing lesion within the pancreas at the same level on CT scan dated 08/30/2016 that measured 13 mm which is unchanged.  It is felt that most likely this is a well differentiated neuroendocrine tumor that is stable dating back to 2017.  There is no evidence of metastatic neuroendocrine tumor.  I do not think this requires any further work-up in this 86 year old patient unless she develops symptoms attributable to that.  I reviewed  the CTA scans with her and her daughter and answered their questions.  Plan:  She will continue to follow-up with cardiology and we will reconsider TAVR in the future depending on her development of worsening  symptoms and her functional status at that time.  I spent 80 minutes performing this consultation and > 50% of this time was spent face to face counseling and coordinating the care of this patient's severe aortic stenosis.  [name], MD 08/27/2020

## 2020-10-02 DIAGNOSIS — Z1389 Encounter for screening for other disorder: Secondary | ICD-10-CM | POA: Diagnosis not present

## 2020-10-02 DIAGNOSIS — E039 Hypothyroidism, unspecified: Secondary | ICD-10-CM | POA: Diagnosis not present

## 2020-10-02 DIAGNOSIS — Z681 Body mass index (BMI) 19 or less, adult: Secondary | ICD-10-CM | POA: Diagnosis not present

## 2020-10-02 DIAGNOSIS — Z0000 Encounter for general adult medical examination without abnormal findings: Secondary | ICD-10-CM | POA: Diagnosis not present

## 2020-12-16 ENCOUNTER — Ambulatory Visit: Payer: Medicare Other | Admitting: Cardiology

## 2021-02-12 NOTE — Progress Notes (Signed)
Cardiology Office Note  Date: 02/13/2021   ID: [name], [name] 1933/05/25, MRN [id number]  PCP:  Assunta Found, MD  Cardiologist:  Nona Dell, MD Electrophysiologist:  None   Chief Complaint  Patient presents with  . Cardiac follow-up    History of Present Illness: [name] is an 87 y.o. female last assessed via telehealth encounter in September 2021.  She is here for a follow-up visit.  She was seen in September of last year by Dr. Clifton James for potential TAVR evaluation.  Cardiac catheterization demonstrated only 25% mid LAD stenosis.  She was also ultimately found to have a suspected well differentiated neuroendocrine tumor in the head of the pancreas.  This was stable compared with previous imaging studies and not obviously symptomatic to require further work-up.  She was seen by Dr. Laneta Simmers in December 2021 and ultimately the decision was made to hold off on TAVR given her present functional capacity.  She still lives in her own home, uses a wheelchair at all times but is able to take care of ADLs including cooking.  Her daughter brings groceries once a month.  I reviewed her medications which are outlined below.  Past Medical History:  Diagnosis Date  . Anxiety   . Aortic stenosis   . Arthritis   . CAD (coronary artery disease)    Mild at cardiac catheterization 2018  . Chronic bronchitis   . Constipation   . Depression   . Hyperlipidemia   . Hypothyroidism     Past Surgical History:  Procedure Laterality Date  . CARDIAC CATHETERIZATION  07/04/2020  . CHOLECYSTECTOMY N/A 02/15/2017   Procedure: LAPAROSCOPIC CHOLECYSTECTOMY;  Surgeon: Axel Filler, MD;  Location: [facility] OR;  Service: General;  Laterality: N/A;  . CORONARY ANGIOGRAPHY N/A 02/14/2017   Procedure: Coronary Angiography;  Surgeon: Yvonne Kendall, MD;  Location: [facility] INVASIVE CV LAB;  Service: Cardiovascular;  Laterality: N/A;  . EYE SURGERY  2009   bilateral  . HEMATOMA  EVACUATION  06/04/2011   Procedure: EVACUATION HEMATOMA;  Surgeon: Fuller Canada, MD;  Location: [facility] ORS;  Service: Orthopedics;  Laterality: Right;  evacuation of seroma  . JOINT REPLACEMENT  05/2010   left total knee Dr. Romeo Apple  . Left hip replacement @ [facility] '98    . Left total knee    . PATELLAR TENDON REPAIR  06/04/2011   Procedure: PATELLA TENDON REPAIR;  Surgeon: Fuller Canada, MD;  Location: [facility] ORS;  Service: Orthopedics;  Laterality: Right;  . PATELLAR TENDON REPAIR  08/30/2011   Procedure: PATELLA TENDON REPAIR;  Surgeon: Fuller Canada, MD;  Location: [facility] ORS;  Service: Orthopedics;  Laterality: Right;  Allograft Reconstrucation Right Patella Tendon  . REPLACEMENT TOTAL KNEE BILATERAL    . RIGHT/LEFT HEART CATH AND CORONARY ANGIOGRAPHY N/A 07/04/2020   Procedure: RIGHT/LEFT HEART CATH AND CORONARY ANGIOGRAPHY;  Surgeon: Kathleene Hazel, MD;  Location: [facility] INVASIVE CV LAB;  Service: Cardiovascular;  Laterality: N/A;  . TOTAL HIP ARTHROPLASTY    . TOTAL KNEE ARTHROPLASTY  05/03/2011   Procedure: TOTAL KNEE ARTHROPLASTY;  Surgeon: Fuller Canada, MD;  Location: [facility] ORS;  Service: Orthopedics;  Laterality: Right;  With DePuy  . TOTAL THYROIDECTOMY  March 2011   Dr. Suszanne Conners @ [facility]  . TUBAL LIGATION      Current Outpatient Medications  Medication Sig Dispense Refill  . Apoaequorin (PREVAGEN PO) Take 1 tablet by mouth daily at 12 noon.    Marland Kitchen aspirin EC 81 MG tablet Take 81 mg  by mouth daily.    . Cholecalciferol (D3 HIGH POTENCY) 50 MCG (2000 UT) CAPS Take 2,000 Units by mouth daily.    . furosemide (LASIX) 20 MG tablet Take 20 mg by mouth 2 (two) times daily.  11  . ibuprofen (ADVIL,MOTRIN) 200 MG tablet Take 600 mg by mouth 2 (two) times daily as needed for moderate pain (shoulder pain.).     Marland Kitchen levothyroxine (SYNTHROID) 137 MCG tablet Take 137 mcg by mouth daily before breakfast.    . oxybutynin (DITROPAN-XL) 10 MG 24 hr tablet Take 10 mg by mouth daily.    Marland Kitchen PARoxetine (PAXIL)  20 MG tablet Take 20 mg by mouth daily.     No current facility-administered medications for this visit.   Allergies:  Patient has no known allergies.   ROS: No palpitations or syncope.  Physical Exam: VS:  BP (!) 148/78   Pulse (!) 54   Ht 5\' 9"  (1.753 m)   Wt 220 lb 3.8 oz (99.9 kg)   SpO2 95%   BMI 32.52 kg/m , BMI Body mass index is 32.52 kg/m.  Wt Readings from Last 3 Encounters:  02/13/21 220 lb 3.8 oz (99.9 kg)  07/05/20 235 lb 14.3 oz (107 kg)  06/23/20 242 lb 8.1 oz (110 kg)    General: Elderly woman, appears comfortable at rest.  In wheelchair. HEENT: Conjunctiva and lids normal, wearing a mask. Neck: Supple, no elevated JVP or carotid bruits, no thyromegaly. Lungs: Clear to auscultation, nonlabored breathing at rest. Cardiac: Regular rate and rhythm, no S3, 4/6 systolic murmur, no pericardial rub. Extremities: Venous stasis with chronic appearing leg edema.  ECG:  An ECG dated 06/23/2020 was personally reviewed today and demonstrated:  Sinus bradycardia with left anterior fascicular block.  Recent Labwork: 06/23/2020: BUN 16; Creatinine, Ser 0.73; Platelets 282 07/04/2020: Hemoglobin 11.9; Potassium 2.8; Sodium 144   Other Studies Reviewed Today:  Echocardiogram 06/16/2020: 1. Left ventricular ejection fraction, by estimation, is 65 to 70%. The  left ventricle has normal function. The left ventricle has no regional  wall motion abnormalities. There is moderate left ventricular hypertrophy.  Left ventricular diastolic  parameters are consistent with Grade I diastolic dysfunction (impaired  relaxation).  2. Right ventricular systolic function is normal. The right ventricular  size is normal. There is normal pulmonary artery systolic pressure. The  estimated right ventricular systolic pressure is 34.6 mmHg.  3. Left atrial size was severely dilated.  4. Right atrial size was upper normal.  5. The mitral valve is abnormal. Trivial mitral valve regurgitation.   Moderate mitral annular calcification.  6. The aortic valve has an indeterminant number of cusps. There is severe  calcifcation of the aortic valve. Aortic valve regurgitation is trivial.  Severe aortic valve stenosis. Aortic valve mean gradient measures 42.0  mmHg. Aortic valve Vmax measures  4.27 m/s. Dimentionless index 0.30.  7. The inferior vena cava is normal in size with greater than 50%  respiratory variability, suggesting right atrial pressure of 3 mmHg.   Cardiac catheterization 07/04/2020:  Mid LAD lesion is 25% stenosed.   1. Mild non-obstructive CAD 2. Severe aortic stenosis (mean gradient 47.6 mmHg, peak to peak gradient 43 mmHg, AVA 0.89 cm2).   Recommendations: Continue planning for TAVR  Carotid Dopplers 07/09/2020: Summary:  Right Carotid: Velocities in the right ICA are consistent with a 1-39%  stenosis.   Left Carotid: Velocities in the left ICA are consistent with a 1-39%  stenosis.   Vertebrals: Bilateral vertebral arteries demonstrate  antegrade flow.  Subclavians: Normal flow hemodynamics were seen in bilateral subclavian        arteries.   Assessment and Plan:  1.  Severe calcific aortic stenosis.  She underwent evaluation for TAVR and decision at this point is to hold off on proceeding given present functional capacity.  I reviewed the interval testing and evaluation since our last encounter.  Continue observation.  2.  Mild CAD, 25% mid LAD stenosis by cardiac catheterization in October 2021.  No active angina symptoms.  Continue aspirin.  Medication Adjustments/Labs and Tests Ordered: Current medicines are reviewed at length with the patient today.  Concerns regarding medicines are outlined above.   Tests Ordered: No orders of the defined types were placed in this encounter.   Medication Changes: No orders of the defined types were placed in this encounter.   Disposition:  Follow up 6 months.  Signed, Jonelle Sidle, MD,  [facility] 02/13/2021 10:04 AM    [facility] at [facility] [address] [place], [place], [place] [address] Phone: [phone]; Fax: [phone]

## 2021-02-13 ENCOUNTER — Ambulatory Visit (INDEPENDENT_AMBULATORY_CARE_PROVIDER_SITE_OTHER): Payer: Medicare Other | Admitting: Cardiology

## 2021-02-13 ENCOUNTER — Encounter: Payer: Self-pay | Admitting: Cardiology

## 2021-02-13 VITALS — BP 148/78 | HR 54 | Ht 69.0 in | Wt 220.2 lb

## 2021-02-13 DIAGNOSIS — I2510 Atherosclerotic heart disease of native coronary artery without angina pectoris: Secondary | ICD-10-CM

## 2021-02-13 DIAGNOSIS — I350 Nonrheumatic aortic (valve) stenosis: Secondary | ICD-10-CM

## 2021-02-13 NOTE — Patient Instructions (Signed)
Medication Instructions:  Continue all current medications.   Labwork: none  Testing/Procedures: none  Follow-Up: 6 months   Any Other Special Instructions Will Be Listed Below (If Applicable).   If you need a refill on your cardiac medications before your next appointment, please call your pharmacy.  

## 2021-03-24 DIAGNOSIS — Z681 Body mass index (BMI) 19 or less, adult: Secondary | ICD-10-CM | POA: Diagnosis not present

## 2021-03-24 DIAGNOSIS — E782 Mixed hyperlipidemia: Secondary | ICD-10-CM | POA: Diagnosis not present

## 2021-03-24 DIAGNOSIS — M19012 Primary osteoarthritis, left shoulder: Secondary | ICD-10-CM | POA: Diagnosis not present

## 2021-03-24 DIAGNOSIS — E039 Hypothyroidism, unspecified: Secondary | ICD-10-CM | POA: Diagnosis not present

## 2021-03-25 ENCOUNTER — Other Ambulatory Visit (HOSPITAL_COMMUNITY): Payer: Self-pay | Admitting: Family Medicine

## 2021-03-25 DIAGNOSIS — E2839 Other primary ovarian failure: Secondary | ICD-10-CM

## 2021-03-25 DIAGNOSIS — Z1231 Encounter for screening mammogram for malignant neoplasm of breast: Secondary | ICD-10-CM

## 2021-04-06 ENCOUNTER — Other Ambulatory Visit (HOSPITAL_COMMUNITY): Payer: Medicare Other

## 2021-05-06 DIAGNOSIS — E039 Hypothyroidism, unspecified: Secondary | ICD-10-CM | POA: Diagnosis not present

## 2021-09-23 ENCOUNTER — Other Ambulatory Visit: Payer: Self-pay

## 2021-09-23 ENCOUNTER — Other Ambulatory Visit (HOSPITAL_COMMUNITY): Payer: Self-pay | Admitting: Physician Assistant

## 2021-09-23 ENCOUNTER — Ambulatory Visit (HOSPITAL_COMMUNITY)
Admission: RE | Admit: 2021-09-23 | Discharge: 2021-09-23 | Disposition: A | Discharge status: Home / Self Care | Payer: Medicare Other | Source: Ambulatory Visit | Attending: Physician Assistant | Admitting: Physician Assistant

## 2021-09-23 DIAGNOSIS — M25512 Pain in left shoulder: Secondary | ICD-10-CM

## 2021-09-23 DIAGNOSIS — S40012A Contusion of left shoulder, initial encounter: Secondary | ICD-10-CM | POA: Diagnosis not present

## 2021-09-23 DIAGNOSIS — M19012 Primary osteoarthritis, left shoulder: Secondary | ICD-10-CM | POA: Diagnosis not present

## 2021-09-30 ENCOUNTER — Ambulatory Visit: Payer: Medicare Other | Admitting: Cardiology

## 2021-11-24 ENCOUNTER — Ambulatory Visit (INDEPENDENT_AMBULATORY_CARE_PROVIDER_SITE_OTHER): Payer: Medicare Other | Admitting: Cardiology

## 2021-11-24 ENCOUNTER — Encounter: Payer: Self-pay | Admitting: Cardiology

## 2021-11-24 VITALS — BP 168/82 | HR 57 | Ht 69.5 in

## 2021-11-24 DIAGNOSIS — I10 Essential (primary) hypertension: Secondary | ICD-10-CM

## 2021-11-24 DIAGNOSIS — I25119 Atherosclerotic heart disease of native coronary artery with unspecified angina pectoris: Secondary | ICD-10-CM | POA: Diagnosis not present

## 2021-11-24 DIAGNOSIS — I350 Nonrheumatic aortic (valve) stenosis: Secondary | ICD-10-CM

## 2021-11-24 NOTE — Patient Instructions (Signed)

## 2021-11-24 NOTE — Progress Notes (Signed)
Cardiology Office Note  Date: 11/24/2021   ID: Paige, Chen 10-Jan-1933, MRN [id number]  PCP:  Sharilyn Sites, MD  Cardiologist:  Rozann Lesches, MD Electrophysiologist:  None   Chief Complaint  Patient presents with   Cardiac follow-up    History of Present Illness: [name] is an 88 y.o. female last seen in May 2022.  She is here for a routine visit with her daughter.  Still lives in her own home, has fairly severe arthritis with limited functional capacity, uses a wheelchair to get around.  She did have a fall back in December, no obvious fracture by left shoulder x-ray, severe arthritic changes noted.  She does not report any chest pain.  No syncope.  She was seen previously by Dr. Angelena Form for potential TAVR evaluation. Cardiac catheterization demonstrated only 25% mid LAD stenosis.  She was also ultimately found to have a suspected well differentiated neuroendocrine tumor in the head of the pancreas.  This was stable compared with previous imaging studies and not obviously symptomatic to require further work-up.  She was seen by Dr. Cyndia Bent in December 2021 and ultimately the decision was made to hold off on TAVR given her present functional capacity.  I personally reviewed her ECG today which shows sinus bradycardia with increased voltage and repolarization abnormalities.  Past Medical History:  Diagnosis Date   Anxiety    Aortic stenosis    Arthritis    CAD (coronary artery disease)    Mild at cardiac catheterization 2018   Chronic bronchitis    Constipation    Depression    Hyperlipidemia    Hypothyroidism     Past Surgical History:  Procedure Laterality Date   CARDIAC CATHETERIZATION  07/04/2020   CHOLECYSTECTOMY N/A 02/15/2017   Procedure: LAPAROSCOPIC CHOLECYSTECTOMY;  Surgeon: Ralene Ok, MD;  Location: [facility];  Service: General;  Laterality: N/A;   CORONARY ANGIOGRAPHY N/A 02/14/2017   Procedure: Coronary Angiography;  Surgeon: Nelva Bush, MD;  Location: [facility];  Service: Cardiovascular;  Laterality: N/A;   EYE SURGERY  2009   bilateral   HEMATOMA EVACUATION  06/04/2011   Procedure: EVACUATION HEMATOMA;  Surgeon: Arther Abbott, MD;  Location: [facility];  Service: Orthopedics;  Laterality: Right;  evacuation of seroma   JOINT REPLACEMENT  05/2010   left total knee Dr. Aline Brochure   Left hip replacement @ [facility] '98     Left total knee     PATELLAR TENDON REPAIR  06/04/2011   Procedure: PATELLA TENDON REPAIR;  Surgeon: Arther Abbott, MD;  Location: [facility];  Service: Orthopedics;  Laterality: Right;   PATELLAR TENDON REPAIR  08/30/2011   Procedure: PATELLA TENDON REPAIR;  Surgeon: Arther Abbott, MD;  Location: [facility];  Service: Orthopedics;  Laterality: Right;  Allograft Reconstrucation Right Patella Tendon   REPLACEMENT TOTAL KNEE BILATERAL     RIGHT/LEFT HEART CATH AND CORONARY ANGIOGRAPHY N/A 07/04/2020   Procedure: RIGHT/LEFT HEART CATH AND CORONARY ANGIOGRAPHY;  Surgeon: Burnell Blanks, MD;  Location: [facility];  Service: Cardiovascular;  Laterality: N/A;   TOTAL HIP ARTHROPLASTY     TOTAL KNEE ARTHROPLASTY  05/03/2011   Procedure: TOTAL KNEE ARTHROPLASTY;  Surgeon: Arther Abbott, MD;  Location: [facility];  Service: Orthopedics;  Laterality: Right;  With DePuy   TOTAL THYROIDECTOMY  March 2011   Dr. Benjamine Mola @ [facility]   TUBAL LIGATION      Current Outpatient Medications  Medication Sig Dispense Refill   acetaminophen (  TYLENOL) 650 MG CR tablet Take 650 mg by mouth as needed for pain.     aspirin EC 81 MG tablet Take 81 mg by mouth daily.     Cholecalciferol (D3 HIGH POTENCY) 50 MCG (2000 UT) CAPS Take 2,000 Units by mouth daily.     ibuprofen (ADVIL,MOTRIN) 200 MG tablet Take 600 mg by mouth 2 (two) times daily as needed for moderate pain (shoulder pain.).      levothyroxine (SYNTHROID) 125 MCG tablet Take 125 mcg by mouth daily before breakfast.     Nystatin POWD by Does not apply route as  needed.     oxybutynin (DITROPAN XL) 15 MG 24 hr tablet Take 15 mg by mouth daily.     PARoxetine (PAXIL) 20 MG tablet Take 20 mg by mouth daily.     No current facility-administered medications for this visit.   Allergies:  Patient has no known allergies.   ROS: No syncope.  Physical Exam: VS:  BP (!) 168/82    Pulse (!) 57    Ht 5' 9.5" (1.765 m)    SpO2 94%    BMI 32.06 kg/m , BMI Body mass index is 32.06 kg/m.  Wt Readings from Last 3 Encounters:  02/13/21 220 lb 3.8 oz (99.9 kg)  07/05/20 235 lb 14.3 oz (107 kg)  06/23/20 242 lb 8.1 oz (110 kg)    General: Patient appears comfortable at rest. HEENT: Conjunctiva and lids normal, wearing mask. Neck: Supple, no elevated JVP or carotid bruits, no thyromegaly. Lungs: Clear to auscultation, nonlabored breathing at rest. Cardiac: Regular rate and rhythm, no S3, 4/6 systolic murmur. Extremities: Venous stasis changes.  ECG:  An ECG dated 06/23/2020 was personally reviewed today and demonstrated:  Sinus bradycardia with left anterior fascicular block.  Recent Labwork:  06/23/2020: BUN 16; Creatinine, Ser 0.73; Platelets 282 07/04/2020: Hemoglobin 11.9; Potassium 2.8; Sodium 144   Other Studies Reviewed Today:  Echocardiogram 06/16/2020:  1. Left ventricular ejection fraction, by estimation, is 65 to 70%. The  left ventricle has normal function. The left ventricle has no regional  wall motion abnormalities. There is moderate left ventricular hypertrophy.  Left ventricular diastolic  parameters are consistent with Grade I diastolic dysfunction (impaired  relaxation).   2. Right ventricular systolic function is normal. The right ventricular  size is normal. There is normal pulmonary artery systolic pressure. The  estimated right ventricular systolic pressure is [id number] mmHg.   3. Left atrial size was severely dilated.   4. Right atrial size was upper normal.   5. The mitral valve is abnormal. Trivial mitral valve regurgitation.   Moderate mitral annular calcification.   6. The aortic valve has an indeterminant number of cusps. There is severe  calcifcation of the aortic valve. Aortic valve regurgitation is trivial.  Severe aortic valve stenosis. Aortic valve mean gradient measures 42.0  mmHg. Aortic valve Vmax measures  4.27 m/s. Dimentionless index 0.30.   7. The inferior vena cava is normal in size with greater than 50%  respiratory variability, suggesting right atrial pressure of 3 mmHg.    Cardiac catheterization 07/04/2020: Mid LAD lesion is 25% stenosed.   1. Mild non-obstructive CAD 2. Severe aortic stenosis (mean gradient 47.6 mmHg, peak to peak gradient 43 mmHg, AVA 0.89 cm2).    Recommendations: Continue planning for TAVR  Assessment and Plan:  1.  Severe calcific aortic stenosis based on prior work-up with plan for conservative management.  No substantial change in functional capacity.  Blood pressure elevated,  would be careful not to lower this too much in the setting of aortic stenosis, holding off on antihypertensives at this time.  2.  Previously documented mild coronary atherosclerosis as of October 2021.  No reported angina with current level of activity.  She remains on aspirin at this time.  Medication Adjustments/Labs and Tests Ordered: Current medicines are reviewed at length with the patient today.  Concerns regarding medicines are outlined above.   Tests Ordered: Orders Placed This Encounter  Procedures   EKG 12-Lead    Medication Changes: No orders of the defined types were placed in this encounter.   Disposition:  Follow up  6 months.  Signed, Satira Sark, MD, [facility] 11/24/2021 4:08 PM    [facility] [address], [place], [place] [address] Phone: [phone]; Fax: [phone]

## 2021-12-22 DIAGNOSIS — N390 Urinary tract infection, site not specified: Secondary | ICD-10-CM | POA: Diagnosis not present

## 2021-12-22 DIAGNOSIS — Z993 Dependence on wheelchair: Secondary | ICD-10-CM | POA: Diagnosis not present

## 2022-03-08 ENCOUNTER — Telehealth: Payer: Self-pay | Admitting: Cardiology

## 2022-03-08 DIAGNOSIS — I350 Nonrheumatic aortic (valve) stenosis: Secondary | ICD-10-CM

## 2022-03-08 DIAGNOSIS — R0789 Other chest pain: Secondary | ICD-10-CM

## 2022-03-08 NOTE — Telephone Encounter (Signed)
Had 2 episodes yesterday - one at 10:00 am and another last night.  Each one lasted about 5 minutes.  Both rated 7/10.  No SOB during that time.  Did feel a little nauseated.  No dizziness.  States she has felt fine and back to normal today.  Not on any NTG.  States that she does have arthritis in her arms and does take some Ibuprofen for this.  Does all her house cleaning herself & washing clothes.  Next OV scheduled for September.

## 2022-03-08 NOTE — Telephone Encounter (Signed)
Pt c/o of Chest Pain: STAT if CP now or developed within 24 hours  1. Are you having CP right now? No  2. Are you experiencing any other symptoms (ex. SOB, nausea, vomiting, sweating)? Nausea  3. How long have you been experiencing CP? Yesterday and today  4. Is your CP continuous or coming and going? Come and go  5. Have you taken Nitroglycerin? No ? Pt states that she has a Heart Mumur for about 3 years and this is the first time she has experienced this. She would like to know if this is normal. Please advise

## 2022-03-08 NOTE — Telephone Encounter (Signed)
     Covering for Dr. Domenic Polite - Prior notes reviewed and she has severe aortic stenosis with continued observation recommended and prior catheterization in 2021 showed only mild coronary artery disease. If she is having episodes of chest pain, would recommend providing an Rx for SL NTG (please review instructions for use). If her episodes become more frequent or do not resolve, she should seek emergent evaluation. Given her symptoms, would recommend obtaining a repeat echocardiogram since her last one was in 2021. If her valve has worsened or she keeps having symptoms, will need to refer back to the Structural Heart team for re-evaluation.   Signed, Erma Heritage, PA-C 03/08/2022, 6:41 PM

## 2022-03-09 MED ORDER — NITROGLYCERIN 0.4 MG SL SUBL: 0.4000 mg | SUBLINGUAL_TABLET | SUBLINGUAL | 3 refills | Status: AC | PRN

## 2022-03-09 NOTE — Telephone Encounter (Signed)
Patient notified and verbalized understanding.  Order for Echo entered & sent to [facility] for scheduling.  New prescription for NTG sent to [facility]

## 2022-03-11 ENCOUNTER — Ambulatory Visit (INDEPENDENT_AMBULATORY_CARE_PROVIDER_SITE_OTHER): Payer: Medicare Other

## 2022-03-11 DIAGNOSIS — I350 Nonrheumatic aortic (valve) stenosis: Secondary | ICD-10-CM | POA: Diagnosis not present

## 2022-03-11 DIAGNOSIS — R0789 Other chest pain: Secondary | ICD-10-CM

## 2022-03-11 LAB — ECHOCARDIOGRAM COMPLETE
AV Mean grad: 54 mmHg
AV Peak grad: 104.3 mmHg
AV Vena cont: 0.26 cm
Ao pk vel: 5.11 m/s
Area-P 1/2: 2.55 cm2
Calc EF: 66.3 %
S' Lateral: 1.37 cm
Single Plane A2C EF: 61.5 %
Single Plane A4C EF: 72.8 %

## 2022-03-15 ENCOUNTER — Telehealth: Payer: Self-pay | Admitting: *Deleted

## 2022-03-15 NOTE — Telephone Encounter (Signed)
Lesle Chris, LPN  0/34/7425  9:30 AM EDT Back to Top    Notified, copy to pcp.  States she already has been contacted & has OV scheduled for 03/24/22 with Dr. Sanjuana Kava for TAVR consult

## 2022-03-15 NOTE — Telephone Encounter (Signed)
-----  Message from Erma Heritage, [place] sent at 03/11/2022  7:44 PM EDT ----- Covering for Dr. Domenic Polite - Please let the patient know her follow-up echocardiogram, shows her ejection fraction is normal at 65-70% but she does have thickness of the heart muscle. Her aortic stenosis has progressed since prior imaging in 2021 as her mean gradient was 42 mmHg at that time and is now at 68 mmHg. She was previously evaluated by the Structural Heart Team in 2021 and was not felt to require TAVR at that time. Given her recent symptoms and worsening gradients, I think it would be best if she met with them again and I will send them a message to get this arranged.

## 2022-03-24 ENCOUNTER — Ambulatory Visit (INDEPENDENT_AMBULATORY_CARE_PROVIDER_SITE_OTHER): Payer: Medicare Other | Admitting: Cardiovascular Disease

## 2022-03-24 ENCOUNTER — Encounter: Payer: Self-pay | Admitting: Cardiovascular Disease

## 2022-03-24 VITALS — BP 148/70 | HR 56 | Ht 69.5 in

## 2022-03-24 DIAGNOSIS — I350 Nonrheumatic aortic (valve) stenosis: Secondary | ICD-10-CM

## 2022-03-24 NOTE — Progress Notes (Signed)
[facility] Note  Chief Complaint  Patient presents with   Follow-up    Aortic stenosis   History of Present Illness: 88 yo female with history of arthritis, CAD, chronic bronchitis, anxiety/depression, hyperlipidemia, hypothyroidism and severe aortic stenosis who is here today for follow up. I saw her as a new consult in 2021 for further discussion regarding her aortic stenosis. She had been followed for moderate aortic stenosis by Dr. Domenic Polite. Echo in September 2021 with normal LV size and function with LVEF=70%. Moderate LVH. Severe AS with mean gradient 42 mmHg, peak gradient 72.9 mmHg, AVA 0.75 cm2.  I saw her in the office in September 2021 and we discussed potential TAVR. Cardiac cath October 2021 with mild CAD. Cardiac CTA with anatomy suitable for a 26 mm Edwards Sapien 3 valve. She appeared to have femoral anatomy suitable for transfemoral access for TAVR. Small pancreatic lesion. Follow up PET scan with findings consistent with stable well differentiated neuroendocrine tumor with no mets. She was seen by Dr. Cyndia Bent in the CT surgery office and based on her minimal symptoms and overall inactivity, TAVR workup was stopped. She is mostly confined to a wheelchair. She has had recent chest pain. This is central in her chest. This lasted about 30 minutes and occurred twice. Last episode was a week ago. She has had progressive fatigue and weakness. She has not noticed dyspnea. Echo 03/11/22 with LVEF=65-70%. Severe AS with mean gradient 68 mmHg. No AVA reported.   She is here today for follow up. The patient denies any dyspnea, palpitations, lower extremity edema, orthopnea, PND, dizziness, near syncope or syncope. She has progressive fatigue and chest pain as above. She thought she was dying last week when she was having the chest pain.   She lives alone with her dog and two cats in [place], [place]. She is retired from office work. She has no teeth. She does not have dentures. She does  not ambulate. She is in a wheelchair at home. She does her own cooking and cleaning from her wheelchair.   Primary Care Physician: Sharilyn Sites, MD Primary Cardiologist: Domenic Polite Referring Cardiologist: Domenic Polite  Past Medical History:  Diagnosis Date   Anxiety    Aortic stenosis    Arthritis    CAD (coronary artery disease)    Mild at cardiac catheterization 2018   Chronic bronchitis    Constipation    Depression    Hyperlipidemia    Hypothyroidism     Past Surgical History:  Procedure Laterality Date   CARDIAC CATHETERIZATION  07/04/2020   CHOLECYSTECTOMY N/A 02/15/2017   Procedure: LAPAROSCOPIC CHOLECYSTECTOMY;  Surgeon: Ralene Ok, MD;  Location: [facility];  Service: General;  Laterality: N/A;   CORONARY ANGIOGRAPHY N/A 02/14/2017   Procedure: Coronary Angiography;  Surgeon: Nelva Bush, MD;  Location: [facility];  Service: Cardiovascular;  Laterality: N/A;   EYE SURGERY  2009   bilateral   HEMATOMA EVACUATION  06/04/2011   Procedure: EVACUATION HEMATOMA;  Surgeon: Arther Abbott, MD;  Location: [facility];  Service: Orthopedics;  Laterality: Right;  evacuation of seroma   JOINT REPLACEMENT  05/2010   left total knee Dr. Aline Brochure   Left hip replacement @ [facility] '98     Left total knee     PATELLAR TENDON REPAIR  06/04/2011   Procedure: PATELLA TENDON REPAIR;  Surgeon: Arther Abbott, MD;  Location: [facility];  Service: Orthopedics;  Laterality: Right;   PATELLAR TENDON REPAIR  08/30/2011   Procedure: PATELLA TENDON  REPAIR;  Surgeon: Fuller Canada, MD;  Location: [facility];  Service: Orthopedics;  Laterality: Right;  Allograft Reconstrucation Right Patella Tendon   REPLACEMENT TOTAL KNEE BILATERAL     RIGHT/LEFT HEART CATH AND CORONARY ANGIOGRAPHY N/A 07/04/2020   Procedure: RIGHT/LEFT HEART CATH AND CORONARY ANGIOGRAPHY;  Surgeon: Kathleene Hazel, MD;  Location: [facility];  Service: Cardiovascular;  Laterality: N/A;   TOTAL HIP ARTHROPLASTY     TOTAL  KNEE ARTHROPLASTY  05/03/2011   Procedure: TOTAL KNEE ARTHROPLASTY;  Surgeon: Fuller Canada, MD;  Location: [facility];  Service: Orthopedics;  Laterality: Right;  With DePuy   TOTAL THYROIDECTOMY  March 2011   Dr. Suszanne Conners @ [facility]   TUBAL LIGATION      Current Outpatient Medications  Medication Sig Dispense Refill   acetaminophen (TYLENOL) 650 MG CR tablet Take 650 mg by mouth as needed for pain.     aspirin EC 81 MG tablet Take 81 mg by mouth daily.     Cholecalciferol (D3 HIGH POTENCY) 50 MCG (2000 UT) CAPS Take 2,000 Units by mouth daily.     ibuprofen (ADVIL,MOTRIN) 200 MG tablet Take 600 mg by mouth 2 (two) times daily as needed for moderate pain (shoulder pain.).      levothyroxine (SYNTHROID) 125 MCG tablet Take 125 mcg by mouth daily before breakfast.     nitroGLYCERIN (NITROSTAT) 0.4 MG SL tablet Place 1 tablet (0.4 mg total) under the tongue every 5 (five) minutes as needed for chest pain. 25 tablet 3   Nystatin POWD by Does not apply route as needed.     oxybutynin (DITROPAN XL) 15 MG 24 hr tablet Take 15 mg by mouth daily.     PARoxetine (PAXIL) 20 MG tablet Take 20 mg by mouth daily.     No current facility-administered medications for this visit.    No Known Allergies  Social History   Socioeconomic History   Marital status: Widowed    Spouse name: Not on file   Number of children: 4   Years of education: Not on file   Highest education level: Not on file  Occupational History   Occupation: Retired-Office work  Tobacco Use   Smoking status: Former    Types: Cigarettes    Quit date: 09/27/1978    Years since quitting: 43.5   Smokeless tobacco: Never  Vaping Use   Vaping Use: Never used  Substance and Sexual Activity   Alcohol use: No   Drug use: No   Sexual activity: Not Currently  Other Topics Concern   Not on file  Social History Narrative   ** Merged History Encounter **       Social Determinants of Health   Financial Resource Strain: Not on file  Food  Insecurity: Not on file  Transportation Needs: Not on file  Physical Activity: Not on file  Stress: Not on file  Social Connections: Not on file  Intimate Partner Violence: Not on file    Family History  Problem Relation Age of Onset   Colon cancer Mother    Blindness Sister    Anesthesia problems Neg Hx    Hypotension Neg Hx    Malignant hyperthermia Neg Hx    Pseudochol deficiency Neg Hx     Review of Systems:  As stated in the HPI and otherwise negative.   BP (!) 148/70   Pulse (!) 56   Ht 5' 9.5" (1.765 m)   SpO2 98%   BMI 32.06 kg/m  Physical Examination:  General: Well developed, well nourished, NAD  HEENT: OP clear, mucus membranes moist  SKIN: warm, dry. No rashes. Neuro: No focal deficits  Psychiatric: Mood and affect normal  Neck: No JVD Lungs:Clear bilaterally, no wheezes, rhonci, crackles Cardiovascular: Regular rate and rhythm. Loud, harsh systolic murmur. . Abdomen:Soft. Bowel sounds present. Non-tender.  Extremities: No lower extremity edema.   EKG:  EKG is ordered today. The ekg ordered today demonstrates Sinus brady, rate 56 bpm. LVH  Echo 03/11/22:  1. Dynamic LVOT gradient in setting of severe symmetric LVH and  hyperdynamic LV function. Resting gradient 35 mmHg, Valsalva not  performed. . Left ventricular ejection fraction, by estimation, is 65 to  70%. The left ventricle has normal function. Left  ventricular endocardial border not optimally defined to evaluate regional  wall motion. There is severe left ventricular hypertrophy. Left  ventricular diastolic parameters are consistent with Grade I diastolic  dysfunction (impaired relaxation). Elevated  left atrial pressure.   2. Right ventricular systolic function is normal. The right ventricular  size is normal. Tricuspid regurgitation signal is inadequate for assessing  PA pressure.   3. Left atrial size was mild to moderately dilated.   4. The mitral valve is normal in structure. No  evidence of mitral valve  regurgitation. Mild mitral stenosis.   5. Severe aortic stenosis by mean gradient. Unable to calculate AVA due  to significant pulse wave Doppler aliasing and inability to measure LVOT  vmax and VTI. Max mean gradient is with Pedhoff probe measuring 68 mmHg. .  The aortic valve has an  indeterminant number of cusps. There is moderate calcification of the  aortic valve. There is moderate thickening of the aortic valve. Aortic  valve regurgitation is trivial. Severe aortic valve stenosis.   6. The inferior vena cava is normal in size with greater than 50%  respiratory variability, suggesting right atrial pressure of 3 mmHg.   Comparison(s): Echocardiogram done 06/16/20 showed an EF of 65-70% with  severe AS and an AV Mean Grad of 42 mmHg.   FINDINGS   Left Ventricle: Dynamic LVOT gradient in setting of severe symmetric LVH  and hyperdynamic LV function. Resting gradient 35 mmHg, Valsalva not  performed. Left ventricular ejection fraction, by estimation, is 65 to  70%. The left ventricle has normal  function. Left ventricular endocardial border not optimally defined to  evaluate regional wall motion. Global longitudinal strain performed but  not reported based on interpreter judgement due to suboptimal tracking.  The left ventricular internal cavity  size was normal in size. There is severe left ventricular hypertrophy.  Left ventricular diastolic parameters are consistent with Grade I  diastolic dysfunction (impaired relaxation). Elevated left atrial  pressure.   Right Ventricle: The right ventricular size is normal. Right vetricular  wall thickness was not well visualized. Right ventricular systolic  function is normal. Tricuspid regurgitation signal is inadequate for  assessing PA pressure.   Left Atrium: Left atrial size was mild to moderately dilated.   Right Atrium: Right atrial size was normal in size.   Pericardium: There is no evidence of  pericardial effusion.   Mitral Valve: The mitral valve is normal in structure. There is mild  thickening of the mitral valve leaflet(s). There is mild calcification of  the mitral valve leaflet(s). Mild to moderate mitral annular  calcification. No evidence of mitral valve  regurgitation. Mild mitral valve stenosis. The mean mitral valve gradient  is 3.0 mmHg.   Tricuspid Valve: The tricuspid  valve is not well visualized. Tricuspid  valve regurgitation is mild . No evidence of tricuspid stenosis.   Aortic Valve: Severe aortic stenosis by mean gradient. Unable to calculate  AVA due to significant pulse wave Doppler aliasing and inability to  measure LVOT vmax and VTI. Max mean gradient is with Pedhoff probe  measuring 68 mmHg. The aortic valve has an  indeterminant number of cusps. There is moderate calcification of the  aortic valve. There is moderate thickening of the aortic valve. There is  moderate aortic valve annular calcification. Aortic valve regurgitation is  trivial. Severe aortic stenosis is  present. Aortic valve mean gradient measures 54.0 mmHg. Aortic valve peak  gradient measures 104.3 mmHg.   Pulmonic Valve: The pulmonic valve was not well visualized. Pulmonic valve  regurgitation is not visualized. No evidence of pulmonic stenosis.   Aorta: The aortic root is normal in size and structure.   Venous: The inferior vena cava is normal in size with greater than 50%  respiratory variability, suggesting right atrial pressure of 3 mmHg.   IAS/Shunts: No atrial level shunt detected by color flow Doppler.      LEFT VENTRICLE  PLAX 2D  LVIDd:         1.90 cm     Diastology  LVIDs:         1.37 cm     LV e' medial:    5.00 cm/s  LV PW:         1.72 cm     LV E/e' medial:  19.5  LV IVS:        1.70 cm     LV e' lateral:   5.22 cm/s  LVOT diam:     2.00 cm     LV E/e' lateral: 18.6  LVOT Area:     3.14 cm     LV Volumes (MOD)  LV vol d, MOD A2C: 69.9 ml  LV vol d, MOD  A4C: 51.8 ml  LV vol s, MOD A2C: 26.9 ml  LV vol s, MOD A4C: 14.1 ml  LV SV MOD A2C:     43.0 ml  LV SV MOD A4C:     51.8 ml  LV SV MOD BP:      40.6 ml   RIGHT VENTRICLE  RV S prime:     15.10 cm/s  TAPSE (M-mode): 2.0 cm   LEFT ATRIUM              Index        RIGHT ATRIUM           Index  LA diam:        4.00 cm  1.85 cm/m   RA Area:     18.40 cm  LA Vol (A2C):   74.1 ml  34.26 ml/m  RA Volume:   56.90 ml  26.31 ml/m  LA Vol (A4C):   111.5 ml 51.55 ml/m  LA Biplane Vol: 91.0 ml  42.07 ml/m   AORTIC VALVE  AV Vmax:           510.60 cm/s  AV Vmean:          341.600 cm/s  AV VTI:            0.996 m  AV Peak Grad:      104.3 mmHg  AV Mean Grad:      54.0 mmHg  AR Vena Contracta: 0.25 cm     AORTA  Ao Root diam: 2.80 cm  Ao Asc diam:  3.20 cm   MITRAL VALVE  MV Area (PHT): 2.55 cm     SHUNTS  MV Mean grad:  3.0 mmHg     Systemic Diam: 2.00 cm  MV Decel Time: 298 msec  MV E velocity: 97.30 cm/s  MV A velocity: 144.00 cm/s  MV E/A ratio:  0.68   CTA: 07/09/20:   FINDINGS: Aortic Valve: Tri leaflet heavily calcified with restricted leaflet motion Heaviest calcification is on the non coronary cusp AV calcium score 5000   Aorta: No aneurysm with normal arch vessels and moderate calcific atherosclerosis   Sino-tubular Junction: 28 mm   Ascending Thoracic Aorta: 31 mm   Aortic Arch: 24 mm   Descending Thoracic Aorta: 21 mm   Sinus of Valsalva Measurements:   Non-coronary: 29.9 mm   Right - coronary: 30.8 mm   Left -   coronary: 29.7 mm   Coronary Artery Height above Annulus:   Left Main: 14 mm above annulus   Right Coronary: 16.7 mm above annulus   Virtual Basal Annulus Measurements:   Maximum / Minimum Diameter: 26.6 mm x 24.1 mm   Perimeter: 81.5 mm   Area: 506 mm2   Coronary Arteries: Sufficient height above annulus for deployment   Optimum Fluoroscopic Angle for Delivery: LAO 12 Cranial 5 degrees   IMPRESSION: 1. Trileaflet AV with  annular area of 506 mm 2 suitable for a 26 mm Sapien 3 valve   2.  Coronary arteries sufficient height above annulus for deployment   3. Optimum angiographic angle for deployment LAO 12 Cranial 5 degrees   4.  Normal aortic root 3.1 cm   5.  Dilated MPA 3.0 cm suggesting elevated PA pressures   6.  Moderate posterior mitral annular calcification   7.  Heavily calcified AV with score of 5000  Cardiovascular: Heart size is borderline enlarged. There is no significant pericardial fluid, thickening or pericardial calcification. There is aortic atherosclerosis, as well as atherosclerosis of the great vessels of the mediastinum and the coronary arteries, including calcified atherosclerotic plaque in the left main and left anterior descending coronary arteries. Severe thickening and calcification of the aortic valve. Moderate calcifications of the mitral annulus. Dilatation of the pulmonic trunk (3.9 cm in diameter), concerning for pulmonary arterial hypertension.   Mediastinum/Lymph Nodes: No pathologically enlarged mediastinal or hilar lymph nodes. Large hiatal hernia. No axillary lymphadenopathy.   Lungs/Pleura: 7 x 3 mm (mean diameter 5 mm) right lower lobe pulmonary nodule (axial image 65 of series 7), stable compared to prior study from 02/12/2017, considered definitively benign. No other larger more suspicious appearing pulmonary nodules or masses are noted. No acute consolidative airspace disease. No pleural effusions. Azygos lobe (normal anatomical variant) incidentally noted.   Musculoskeletal/Soft Tissues: There are no aggressive appearing lytic or blastic lesions noted in the visualized portions of the skeleton.   CTA ABDOMEN AND PELVIS FINDINGS   Hepatobiliary: No suspicious cystic or solid hepatic lesions. No intra or extrahepatic biliary ductal dilatation. Status post cholecystectomy.   Pancreas: Small hypervascular lesion in the head of the pancreas (axial  image 105 of series 6 and coronal image 64 of series 8) measuring 1.3 x 0.9 x 0.9 cm, concerning for possible neuroendocrine neoplasm or metastatic lesion. No pancreatic ductal dilatation. No pancreatic or peripancreatic fluid collections or inflammatory changes.   Spleen: Unremarkable.   Adrenals/Urinary Tract: 1.3 x 0.9 cm right adrenal nodule, similar in retrospect to the prior study, likely to represent a small benign  lesions such as a adrenal adenoma. Left adrenal gland and bilateral kidneys are unremarkable in appearance. No hydroureteronephrosis. Urinary bladder is normal in appearance.   Stomach/Bowel: The intra-abdominal portion of the stomach is unremarkable. There is no pathologic dilatation of small bowel or colon. The appendix is not confidently identified and may be surgically absent. Regardless, there are no inflammatory changes noted adjacent to the cecum to suggest the presence of an acute appendicitis at this time.   Vascular/Lymphatic: Aortic atherosclerosis, without evidence of aneurysm or dissection in the abdominal or pelvic vasculature. No lymphadenopathy noted in the abdomen or pelvis.   Reproductive: Uterus and ovaries are unremarkable in appearance.   Other: No significant volume of ascites.  No pneumoperitoneum.   Musculoskeletal: Status post left hip arthroplasty. There are no aggressive appearing lytic or blastic lesions noted in the visualized portions of the skeleton.   VASCULAR MEASUREMENTS PERTINENT TO TAVR:   AORTA:   Minimal Aortic Diameter-14 x 13 mm   Severity of Aortic Calcification-moderate   RIGHT PELVIS:   Right Common Iliac Artery -   Minimal Diameter-8.9 x 9.1 mm   Tortuosity-mild   Calcification-mild   Right External Iliac Artery -   Minimal Diameter-6.8 x 6.6 mm   Tortuosity-moderate   Calcification-mild   Right Common Femoral Artery -   Minimal Diameter-7.1 x 6.8 mm   Tortuosity-mild   Calcification-mild    LEFT PELVIS:   Left Common Iliac Artery -   Minimal Diameter-9.3 x 7.6 mm   Tortuosity-moderate   Calcification-mild   Left External Iliac Artery -   Minimal Diameter-7.1 x 5.6 mm   Tortuosity-moderate   Calcification-mild   Left Common Femoral Artery -   Minimal Diameter-8.0 x 5.7 mm   Tortuosity-mild   Calcification-mild   Review of the MIP images confirms the above findings.   IMPRESSION: 1. Vascular findings and measurements pertinent to potential TAVR procedure, as detailed above. 2. Severe thickening calcification of the aortic valve, compatible with reported clinical history of severe aortic stenosis. 3. Aortic atherosclerosis, in addition to left main and left anterior descending coronary artery disease. Please note that although the presence of coronary artery calcium documents the presence of coronary artery disease, the severity of this disease and any potential stenosis cannot be assessed on this non-gated CT examination. Assessment for potential risk factor modification, dietary therapy or pharmacologic therapy may be warranted, if clinically indicated. 4. Dilatation of the pulmonic trunk (3.9 cm in diameter), concerning for pulmonary arterial hypertension. 5. 1.3 x 0.9 x 0.9 cm hypervascular lesion in the head of the pancreas highly concerning for small neuroendocrine neoplasm or metastatic lesion. Further clinical evaluation is recommended. If clinically appropriate, further evaluation with nonemergent Dotatate PET-CT could be considered. 6. 1.3 x 0.9 cm right adrenal nodule, stable compared to the prior examination, incompletely characterize, but statistically likely to represent a tiny adrenal adenoma. 7. Additional incidental findings, as above.  Recent Labs: No results found for requested labs within last 365 days.   Lipid Panel No results found for: "CHOL", "TRIG", "HDL", "CHOLHDL", "VLDL", "LDLCALC", "LDLDIRECT"   Wt Readings from Last  3 Encounters:  02/13/21 220 lb 3.8 oz (99.9 kg)  07/05/20 235 lb 14.3 oz (107 kg)  06/23/20 242 lb 8.1 oz (110 kg)     Other studies Reviewed: Additional studies/ records that were reviewed today include: echo images, EKG, office notes.  Review of the above records demonstrates: Severe AS   Assessment and Plan:   1. Severe Aortic Valve  Stenosis: She has severe aortic stenosis. NYHA class 2.  I have personally reviewed the echo images. The aortic valve is thickened, calcified with limited leaflet mobility. I think she would benefit from AVR. Given advanced age, she is not a good candidate for conventional AVR by surgical approach. She does not ambulate but she does her own cooking and cleaning and lives alone. She is possibly a candidate for TAVR. She refused to proceed with workup in 2021 as she was having minimal symptoms and did not want to risk a stroke with the procedure.    I have reviewed the natural history of aortic stenosis with the patient and their family members  who are present today. We have discussed the limitations of medical therapy and the poor prognosis associated with symptomatic aortic stenosis. We have reviewed potential treatment options, including palliative medical therapy, conventional surgical aortic valve replacement, and transcatheter aortic valve replacement. We discussed treatment options in the context of the patient's specific comorbid medical conditions.   She would like to proceed with TAVR. She has completed the workup including her cath, CT scans and carotid dopplers. Risks and benefits of the valve procedure are reviewed with the patient. I will review her case with our team and decide if we should proceed.   Current medicines are reviewed at length with the patient today.  The patient does not have concerns regarding medicines.  The following changes have been made:  no change  Labs/ tests ordered today include:   No orders of the defined types were  placed in this encounter.    Disposition:   FU with the valve team.    Signed, Lauree Chandler, MD 03/24/2022 2:45 PM    [facility] [place], [place], [place]  [address] Phone: [phone]; Fax: [phone]

## 2022-03-24 NOTE — Patient Instructions (Signed)
Medication Instructions:  Your physician recommends that you continue on your current medications as directed. Please refer to the Current Medication list given to you today.  *If you need a refill on your cardiac medications before your next appointment, please call your pharmacy*  Follow-Up: Per Structural Heart Team  We recommend signing up for the patient portal called "MyChart".  Sign up information is provided on this After Visit Summary.  MyChart is used to connect with patients for Virtual Visits (Telemedicine).  Patients are able to view lab/test results, encounter notes, upcoming appointments, etc.  Non-urgent messages can be sent to your provider as well.   To learn more about what you can do with MyChart, go to [email or web address].    Your next appointment:   Per Structural Heart team will contact you to schedule appointment.   The format for your next appointment:   In Person  Provider:   Nona Dell, MD     Important Information About Sugar

## 2022-04-05 ENCOUNTER — Telehealth: Payer: Self-pay | Admitting: Cardiovascular Disease

## 2022-04-05 NOTE — Telephone Encounter (Signed)
Pt would like for nurse to return call. Please advise

## 2022-04-05 NOTE — Telephone Encounter (Signed)
Returned call to Pt.  Per Pt she was taking her garbage to her back door for pick up when she had chest discomfort.  She came back inside and took a nitro tab and the discomfort resolved.  Has not had any further issues.  She is calling because her daughters want to know how to prevent this in future.  Advised Pt that she did the right thing.  Advised she needs to listen to her body and take a break if she is having discomfort.  Pt in agreement and thanked nurse for call back.  She will reassure her daughters.

## 2022-04-06 ENCOUNTER — Other Ambulatory Visit: Payer: Self-pay

## 2022-04-06 DIAGNOSIS — I350 Nonrheumatic aortic (valve) stenosis: Secondary | ICD-10-CM

## 2022-04-07 ENCOUNTER — Other Ambulatory Visit: Payer: Self-pay

## 2022-04-07 DIAGNOSIS — I350 Nonrheumatic aortic (valve) stenosis: Secondary | ICD-10-CM

## 2022-04-07 DIAGNOSIS — R0789 Other chest pain: Secondary | ICD-10-CM

## 2022-04-12 DIAGNOSIS — I350 Nonrheumatic aortic (valve) stenosis: Secondary | ICD-10-CM | POA: Diagnosis not present

## 2022-04-13 LAB — BASIC METABOLIC PANEL
BUN/Creatinine Ratio: 20 (ref 12–28)
BUN: 15 mg/dL (ref 8–27)
CO2: 28 mmol/L (ref 20–29)
Calcium: 9.2 mg/dL (ref 8.7–10.3)
Chloride: 102 mmol/L (ref 96–106)
Creatinine, Ser: 0.74 mg/dL (ref 0.57–1.00)
Glucose: 90 mg/dL (ref 70–99)
Potassium: 4.3 mmol/L (ref 3.5–5.2)
Sodium: 142 mmol/L (ref 134–144)
eGFR: 78 mL/min/{1.73_m2} (ref >59)

## 2022-04-15 ENCOUNTER — Ambulatory Visit (HOSPITAL_COMMUNITY)
Admission: RE | Admit: 2022-04-15 | Discharge: 2022-04-15 | Disposition: A | Discharge status: Home / Self Care | Payer: Medicare Other | Source: Ambulatory Visit | Attending: Cardiovascular Disease | Admitting: Cardiovascular Disease

## 2022-04-15 DIAGNOSIS — R0789 Other chest pain: Secondary | ICD-10-CM | POA: Diagnosis not present

## 2022-04-15 DIAGNOSIS — K449 Diaphragmatic hernia without obstruction or gangrene: Secondary | ICD-10-CM | POA: Diagnosis not present

## 2022-04-15 DIAGNOSIS — Z01818 Encounter for other preprocedural examination: Secondary | ICD-10-CM | POA: Diagnosis not present

## 2022-04-15 DIAGNOSIS — R911 Solitary pulmonary nodule: Secondary | ICD-10-CM | POA: Diagnosis not present

## 2022-04-15 DIAGNOSIS — I350 Nonrheumatic aortic (valve) stenosis: Secondary | ICD-10-CM | POA: Diagnosis not present

## 2022-04-15 DIAGNOSIS — R918 Other nonspecific abnormal finding of lung field: Secondary | ICD-10-CM | POA: Diagnosis not present

## 2022-04-15 MED ORDER — IOHEXOL 350 MG/ML SOLN
100.0000 mL | Freq: Once | INTRAVENOUS | Status: AC | PRN
  Administered 2022-04-15: 100 mL via INTRAVENOUS

## 2022-05-11 ENCOUNTER — Other Ambulatory Visit: Payer: Self-pay

## 2022-05-11 DIAGNOSIS — I350 Nonrheumatic aortic (valve) stenosis: Secondary | ICD-10-CM

## 2022-05-12 ENCOUNTER — Institutional Professional Consult (permissible substitution) (INDEPENDENT_AMBULATORY_CARE_PROVIDER_SITE_OTHER): Payer: Medicare Other | Admitting: Surgery

## 2022-05-12 VITALS — BP 175/78 | HR 68 | Resp 20 | Ht 69.0 in | Wt 200.0 lb

## 2022-05-12 DIAGNOSIS — I350 Nonrheumatic aortic (valve) stenosis: Secondary | ICD-10-CM

## 2022-05-12 NOTE — Progress Notes (Signed)
STS Risk Score: Risk of Mortality: 2.012% Renal Failure: 1.844% Permanent Stroke: 1.209% Prolonged Ventilation: 7.751% DSW Infection: 0.153% Reoperation: 3.011% Morbidity or Mortality: 12.665% Short Length of Stay: 23.554% Long Length of Stay: 6.781%

## 2022-05-12 NOTE — Progress Notes (Signed)
[facility]   [facility]         [address]       [place] [address]             [phone]          CARDIOTHORACIC SURGERY CONSULTATION REPORT  PCP is Sharilyn Sites, MD Referring Provider is Gennette Pac Primary Cardiologist is Rozann Lesches, MD  Reason for consultation:  critical aortic stenosis  HPI:  The patient is an 88 year old woman with a history of severe degenerative arthritis of the knees with very limited mobility and wheelchair-bound, incontinence, hyperlipidemia, hypothyroidism, anxiety/depression, chronic bronchitis, coronary artery disease, and severe aortic stenosis who I initially evaluated on 08/27/2020 for consideration of TAVR.  At that time she looked very frail and it was not clear that she was having significant symptoms and how much TAVR would help her.  We decided to continue following her aortic stenosis.  Her most recent echocardiogram on 03/11/2022 shows an increase in the mean gradient to 68 mmHg with a peak gradient of 104 mmHg.  Left ventricular ejection fraction is 65 to 70% with severe LVH.  She reports having recent episodes of substernal chest discomfort in the center of her chest that last about 30 minutes.  She has had progressive fatigue and generalized weakness.  She denies any dizziness or syncope.  She has had developed some lower extremity edema. She has no orthopnea.  She is here today with her daughter.  She remains confined to a wheelchair due to severe lower extremity arthritis and inability to support her weight.  Despite being confined to a wheelchair she lives alone and still manages to do her housework, laundry, and take care of herself.  She is able to transfer from her wheelchair on her own.  She has 2 dogs and 2 cats that she cares for.    Past Medical History:  Diagnosis Date   Anxiety    Aortic stenosis    Arthritis    CAD (coronary artery disease)     Mild at cardiac catheterization 2018   Chronic bronchitis    Constipation    Depression    Hyperlipidemia    Hypothyroidism     Past Surgical History:  Procedure Laterality Date   CARDIAC CATHETERIZATION  07/04/2020   CHOLECYSTECTOMY N/A 02/15/2017   Procedure: LAPAROSCOPIC CHOLECYSTECTOMY;  Surgeon: Ralene Ok, MD;  Location: [facility];  Service: General;  Laterality: N/A;   CORONARY ANGIOGRAPHY N/A 02/14/2017   Procedure: Coronary Angiography;  Surgeon: Nelva Bush, MD;  Location: [facility];  Service: Cardiovascular;  Laterality: N/A;   EYE SURGERY  2009   bilateral   HEMATOMA EVACUATION  06/04/2011   Procedure: EVACUATION HEMATOMA;  Surgeon: Arther Abbott, MD;  Location: [facility];  Service: Orthopedics;  Laterality: Right;  evacuation of seroma   JOINT REPLACEMENT  05/2010   left total knee Dr. Aline Brochure   Left hip replacement @ [facility] '98     Left total knee     PATELLAR TENDON REPAIR  06/04/2011   Procedure: PATELLA TENDON REPAIR;  Surgeon: Arther Abbott, MD;  Location: [facility];  Service: Orthopedics;  Laterality: Right;   PATELLAR TENDON REPAIR  08/30/2011   Procedure: PATELLA TENDON REPAIR;  Surgeon: Arther Abbott, MD;  Location: [facility];  Service: Orthopedics;  Laterality: Right;  Allograft Reconstrucation Right Patella Tendon   REPLACEMENT TOTAL KNEE BILATERAL     RIGHT/LEFT HEART CATH AND CORONARY ANGIOGRAPHY N/A  07/04/2020   Procedure: RIGHT/LEFT HEART CATH AND CORONARY ANGIOGRAPHY;  Surgeon: Burnell Blanks, MD;  Location: [facility];  Service: Cardiovascular;  Laterality: N/A;   TOTAL HIP ARTHROPLASTY     TOTAL KNEE ARTHROPLASTY  05/03/2011   Procedure: TOTAL KNEE ARTHROPLASTY;  Surgeon: Arther Abbott, MD;  Location: [facility];  Service: Orthopedics;  Laterality: Right;  With DePuy   TOTAL THYROIDECTOMY  March 2011   Dr. Benjamine Mola @ [facility]   TUBAL LIGATION      Family History  Problem Relation Age of Onset   Colon cancer Mother    Blindness Sister     Anesthesia problems Neg Hx    Hypotension Neg Hx    Malignant hyperthermia Neg Hx    Pseudochol deficiency Neg Hx     Social History   Socioeconomic History   Marital status: Widowed    Spouse name: Not on file   Number of children: 4   Years of education: Not on file   Highest education level: Not on file  Occupational History   Occupation: Retired-Office work  Tobacco Use   Smoking status: Former    Types: Cigarettes    Quit date: 09/27/1978    Years since quitting: 43.6   Smokeless tobacco: Never  Vaping Use   Vaping Use: Never used  Substance and Sexual Activity   Alcohol use: No   Drug use: No   Sexual activity: Not Currently  Other Topics Concern   Not on file  Social History Narrative   ** Merged History Encounter **       Social Determinants of Health   Financial Resource Strain: Not on file  Food Insecurity: Not on file  Transportation Needs: Not on file  Physical Activity: Not on file  Stress: Not on file  Social Connections: Not on file  Intimate Partner Violence: Not on file    Prior to Admission medications   Medication Sig Start Date End Date Taking? Authorizing Provider  acetaminophen (TYLENOL) 650 MG CR tablet Take 650 mg by mouth every 8 (eight) hours as needed for pain.   Yes [provider]  aspirin EC 81 MG tablet Take 81 mg by mouth daily.   Yes [provider]  Cholecalciferol (D3 HIGH POTENCY) 50 MCG (2000 UT) CAPS Take 2,000 Units by mouth daily.   Yes [provider]  ibuprofen (ADVIL,MOTRIN) 200 MG tablet Take 600 mg by mouth every 8 (eight) hours as needed for moderate pain (shoulder pain).   Yes [provider]  levothyroxine (SYNTHROID) 125 MCG tablet Take 125 mcg by mouth daily before breakfast.   Yes [provider]  mineral oil-hydrophilic petrolatum (AQUAPHOR) ointment Apply 1 Application topically as needed for irritation.   Yes [provider]  neomycin-bacitracin-polymyxin  (NEOSPORIN) OINT Apply 1 Application topically as needed for wound care.   Yes [provider]  nitroGLYCERIN (NITROSTAT) 0.4 MG SL tablet Place 1 tablet (0.4 mg total) under the tongue every 5 (five) minutes as needed for chest pain. 03/09/22  Yes Strader, [place], PA-C  Nystatin POWD Apply 1 Application topically as needed (yeast).   Yes [provider]  oxybutynin (DITROPAN XL) 15 MG 24 hr tablet Take 15 mg by mouth daily.   Yes [provider]  PARoxetine (PAXIL) 20 MG tablet Take 20 mg by mouth daily.   Yes [provider]    Current Outpatient Medications  Medication Sig Dispense Refill   acetaminophen (TYLENOL) 650 MG CR tablet  Take 650 mg by mouth every 8 (eight) hours as needed for pain.     aspirin EC 81 MG tablet Take 81 mg by mouth daily.     Cholecalciferol (D3 HIGH POTENCY) 50 MCG (2000 UT) CAPS Take 2,000 Units by mouth daily.     ibuprofen (ADVIL,MOTRIN) 200 MG tablet Take 600 mg by mouth every 8 (eight) hours as needed for moderate pain (shoulder pain).     levothyroxine (SYNTHROID) 125 MCG tablet Take 125 mcg by mouth daily before breakfast.     mineral oil-hydrophilic petrolatum (AQUAPHOR) ointment Apply 1 Application topically as needed for irritation.     neomycin-bacitracin-polymyxin (NEOSPORIN) OINT Apply 1 Application topically as needed for wound care.     nitroGLYCERIN (NITROSTAT) 0.4 MG SL tablet Place 1 tablet (0.4 mg total) under the tongue every 5 (five) minutes as needed for chest pain. 25 tablet 3   Nystatin POWD Apply 1 Application topically as needed (yeast).     oxybutynin (DITROPAN XL) 15 MG 24 hr tablet Take 15 mg by mouth daily.     PARoxetine (PAXIL) 20 MG tablet Take 20 mg by mouth daily.     No current facility-administered medications for this visit.    No Known Allergies    Review of Systems:  no  Cardiac:  + chest pain with exertion, + chest pain at rest, no SOB with exertion, no resting SOB, no PND, no  orthopnea, no palpitations, no arrhythmia, no atrial fibrillation, + LE edema, no dizzy spells, no syncope  Respiratory:  no shortness of breath, no home oxygen, no productive cough, no dry cough, no bronchitis, no wheezing, no hemoptysis, on asthma, no pain with inspiration or cough, no sleep apnea, no CPAP at night  GI:   no difficulty swallowing, no reflux, no frequent heartburn, no hiatal hernia, no abdominal pain, no constipation, no diarrhea, no hematochezia, no hematemesis, no melena  GU:   no dysuria,  + frequency, + incontinence, no urinary tract infection, no hematuria, no kidney stones, no kidney disease  Vascular:  no pain suggestive of claudication, no pain in feet, no leg cramps, + varicose veins, no DVT, no non-healing foot ulcer  Neuro:   no stroke, no TIA's, no seizures, no headaches, no temporary blindness one eye,  no slurred speech, no peripheral neuropathy, no chronic pain, no instability of gait, no memory/cognitive dysfunction  Musculoskeletal: + arthritis of knees, no joint swelling, no myalgias, + can't support weight to stand or walk, + decreased mobility   Skin:   no rash, no itching, no skin infections, no pressure sores or ulcerations  Psych:   no anxiety, no depression, no nervousness, no unusual recent stress  Eyes:   no blurry vision, no floaters, no recent vision changes, + wears glasses  ENT:   no hearing loss, no loose or painful teeth, no dentures, edentulous  Hematologic:  + easy bruising, no abnormal bleeding, no clotting disorder, no frequent epistaxis  Endocrine:  no diabetes, does not check CBG's at home     Physical Exam:   BP (!) 175/78   Pulse 68   Resp 20   Ht 5\' 9"  (1.753 m)   Wt 200 lb (90.7 kg)   SpO2 95% Comment: RA  BMI 29.53 kg/m   General:  In wheelchair but alert and conversant  HEENT:  Unremarkable, NCAT, PERLA, EOMI  Neck:   no JVD, no bruits, no adenopathy   Chest:   clear to auscultation, symmetrical breath sounds, no  wheezes, no  rhonchi   CV:   RRR, 3/6 systolic murmur RSB  Abdomen:  soft, non-tender, no masses   Extremities:  warm, well-perfused, pulses palpable at ankles, mild lower extremity edema  Rectal/GU  Deferred  Neuro:   Grossly non-focal and symmetrical throughout  Skin:   Clean and dry, no rashes, no breakdown  Diagnostic Tests:  ECHOCARDIOGRAM REPORT         Patient Name:   KRISTEN FROMM Date of Exam: 03/11/2022  Medical Rec #:  [id number]           Height:       69.5 in  Accession #:    [id number]          Weight:       220.2 lb  Date of Birth:  [birth year]           BSA:          2.163 m  Patient Age:    89 years            BP:           168/82 mmHg  Patient Gender: F                   HR:           82 bpm.  Exam Location:  [facility]   Procedure: Cardiac Doppler and Color Doppler   Indications:    I35.0 Nonrheumatic aortic (valve) stenosis     History:        Patient has prior history of Echocardiogram examinations,  most                  recent 06/16/2020. CAD and Previous Myocardial Infarction,  Aortic                  Valve Disease, Signs/Symptoms:Chest Pain; Risk                  Factors:Hypertension and Former Smoker.     Sonographer:    Marchelle Folks McFatter RDMS, RVT, RDCS  Referring Phys: 82 SAMUEL G MCDOWELL      Sonographer Comments: Global longitudinal strain was attempted.  IMPRESSIONS     1. Dynamic LVOT gradient in setting of severe symmetric LVH and  hyperdynamic LV function. Resting gradient 35 mmHg, Valsalva not  performed. . Left ventricular ejection fraction, by estimation, is 65 to  70%. The left ventricle has normal function. Left  ventricular endocardial border not optimally defined to evaluate regional  wall motion. There is severe left ventricular hypertrophy. Left  ventricular diastolic parameters are consistent with Grade I diastolic  dysfunction (impaired relaxation). Elevated  left atrial pressure.   2. Right ventricular systolic function is normal. The  right ventricular  size is normal. Tricuspid regurgitation signal is inadequate for assessing  PA pressure.   3. Left atrial size was mild to moderately dilated.   4. The mitral valve is normal in structure. No evidence of mitral valve  regurgitation. Mild mitral stenosis.   5. Severe aortic stenosis by mean gradient. Unable to calculate AVA due  to significant pulse wave Doppler aliasing and inability to measure LVOT  vmax and VTI. Max mean gradient is with Pedhoff probe measuring 68 mmHg. .  The aortic valve has an  indeterminant number of cusps. There is moderate calcification of the  aortic valve. There is moderate thickening of the aortic valve. Aortic  valve regurgitation is trivial. Severe aortic valve stenosis.  6. The inferior vena cava is normal in size with greater than 50%  respiratory variability, suggesting right atrial pressure of 3 mmHg.   Comparison(s): Echocardiogram done 06/16/20 showed an EF of 65-70% with  severe AS and an AV Mean Grad of 42 mmHg.   FINDINGS   Left Ventricle: Dynamic LVOT gradient in setting of severe symmetric LVH  and hyperdynamic LV function. Resting gradient 35 mmHg, Valsalva not  performed. Left ventricular ejection fraction, by estimation, is 65 to  70%. The left ventricle has normal  function. Left ventricular endocardial border not optimally defined to  evaluate regional wall motion. Global longitudinal strain performed but  not reported based on interpreter judgement due to suboptimal tracking.  The left ventricular internal cavity  size was normal in size. There is severe left ventricular hypertrophy.  Left ventricular diastolic parameters are consistent with Grade I  diastolic dysfunction (impaired relaxation). Elevated left atrial  pressure.   Right Ventricle: The right ventricular size is normal. Right vetricular  wall thickness was not well visualized. Right ventricular systolic  function is normal. Tricuspid regurgitation signal  is inadequate for  assessing PA pressure.   Left Atrium: Left atrial size was mild to moderately dilated.   Right Atrium: Right atrial size was normal in size.   Pericardium: There is no evidence of pericardial effusion.   Mitral Valve: The mitral valve is normal in structure. There is mild  thickening of the mitral valve leaflet(s). There is mild calcification of  the mitral valve leaflet(s). Mild to moderate mitral annular  calcification. No evidence of mitral valve  regurgitation. Mild mitral valve stenosis. The mean mitral valve gradient  is 3.0 mmHg.   Tricuspid Valve: The tricuspid valve is not well visualized. Tricuspid  valve regurgitation is mild . No evidence of tricuspid stenosis.   Aortic Valve: Severe aortic stenosis by mean gradient. Unable to calculate  AVA due to significant pulse wave Doppler aliasing and inability to  measure LVOT vmax and VTI. Max mean gradient is with Pedhoff probe  measuring 68 mmHg. The aortic valve has an  indeterminant number of cusps. There is moderate calcification of the  aortic valve. There is moderate thickening of the aortic valve. There is  moderate aortic valve annular calcification. Aortic valve regurgitation is  trivial. Severe aortic stenosis is  present. Aortic valve mean gradient measures 54.0 mmHg. Aortic valve peak  gradient measures 104.3 mmHg.   Pulmonic Valve: The pulmonic valve was not well visualized. Pulmonic valve  regurgitation is not visualized. No evidence of pulmonic stenosis.   Aorta: The aortic root is normal in size and structure.   Venous: The inferior vena cava is normal in size with greater than 50%  respiratory variability, suggesting right atrial pressure of 3 mmHg.   IAS/Shunts: No atrial level shunt detected by color flow Doppler.      LEFT VENTRICLE  PLAX 2D  LVIDd:         1.90 cm     Diastology  LVIDs:         1.37 cm     LV e' medial:    5.00 cm/s  LV PW:         1.72 cm     LV E/e' medial:   19.5  LV IVS:        1.70 cm     LV e' lateral:   5.22 cm/s  LVOT diam:     2.00 cm     LV E/e' lateral:  18.6  LVOT Area:     3.14 cm     LV Volumes (MOD)  LV vol d, MOD A2C: 69.9 ml  LV vol d, MOD A4C: 51.8 ml  LV vol s, MOD A2C: 26.9 ml  LV vol s, MOD A4C: 14.1 ml  LV SV MOD A2C:     43.0 ml  LV SV MOD A4C:     51.8 ml  LV SV MOD BP:      40.6 ml   RIGHT VENTRICLE  RV S prime:     15.10 cm/s  TAPSE (M-mode): 2.0 cm   LEFT ATRIUM              Index        RIGHT ATRIUM           Index  LA diam:        4.00 cm  1.85 cm/m   RA Area:     18.40 cm  LA Vol (A2C):   74.1 ml  34.26 ml/m  RA Volume:   56.90 ml  26.31 ml/m  LA Vol (A4C):   111.5 ml 51.55 ml/m  LA Biplane Vol: 91.0 ml  42.07 ml/m   AORTIC VALVE  AV Vmax:           510.60 cm/s  AV Vmean:          341.600 cm/s  AV VTI:            0.996 m  AV Peak Grad:      104.3 mmHg  AV Mean Grad:      54.0 mmHg  AR Vena Contracta: 0.25 cm     AORTA  Ao Root diam: 2.80 cm  Ao Asc diam:  3.20 cm   MITRAL VALVE  MV Area (PHT): 2.55 cm     SHUNTS  MV Mean grad:  3.0 mmHg     Systemic Diam: 2.00 cm  MV Decel Time: 298 msec  MV E velocity: 97.30 cm/s  MV A velocity: 144.00 cm/s  MV E/A ratio:  0.68   Carlyle Dolly MD  Electronically signed by Carlyle Dolly MD  Signature Date/Time: 03/11/2022/7:29:25 PM         Final     ADDENDUM REPORT: 04/15/2022 13:18   CLINICAL DATA:  Aortic Valve pathology with assessment for TAVR   EXAM: Cardiac TAVR CT   TECHNIQUE: The patient was scanned on a Siemens Force [id number] slice scanner. A 120 kV retrospective scan was triggered in the descending thoracic aorta at 111 HU's. Gantry rotation speed was 270 msecs and collimation was .9 mm. No beta blockade or nitro were given. The 3D data set was reconstructed in 5% intervals of the R-R cycle. Systolic and diastolic phases were analyzed on a dedicated work station using MPR, MIP and VRT modes. The patient received 100 cc of  contrast.   FINDINGS: Aortic Valve: Severely thickened tri-leaflet aortic valve with heavy calcification and reduced excursion the planimeter valve area is 0.798 Sq cm consistent with severe aortic stenosis   Annular calcification: Severe based on presence of LVOT calcification   Aortic Valve Calcium Score: 5748   Presence of basal septal hypertrophy: Yes, severe concentric hypertrophy septal thickness 21 mm in short axis assessment.   There is no systolic anterior motion of the mitral valve on cine assessment.   Systolic measurements greater than diastolic measurements, systolic measurements used (see below)   Perimembranous septal diameter: 12 mm   Mitral Valve: Moderate mitral annular calcification  with calcified anterior and posterior mitral valve leaflets.   Aortic Annulus Measurements- 10 %   Major annulus diameter: 28 mm   Minor annulus diameter: 25 mm   Annular perimeter: 82 mm   Annular area: 5.19 cm2   Aortic Root Measurements   Sinotubular Junction: 30 mm   Ascending Thoracic Aorta: 33 mm   Aortic Arch: 26 mm   Descending Thoracic Aorta: 28 mm   Aortic atherosclerosis.   Sinus of Valsalva Measurements:   Right coronary cusp width: 30 mm   Left coronary cusp width: 30 mm   Non coronary cusp width: 32 mm   Mean diameter: 31 mm   Coronary Artery Height above Annulus:   Left Main: 16 mm   Left SoV height: 25 mm   Right Coronary: 20 mm   Right SoV height: 25 mm   Optimum Fluoroscopic Angle for Delivery: LAO 3, CAU 1   Valves for structural team consideration:   26 mm Edward Sapien Valve   Sufficient coronary height, SoV height and diameter for 29 mm CoreValve   Sapien valve may be favored in the setting of left coronary calcium.   Non TAVR Valve Findings:   Coronary Arteries: Normal coronary origin. Small non dominant RCA with no coronary calcium. Study not completed with nitroglycerin. Unable to fully assess mid LAD and mid  LCX.   Coronary Calcium Score:   Left main: 0   Left anterior descending artery: 437   Left circumflex artery: 234   Right coronary artery: 0   Total: 671   Interatrial septum: No interatrial communication   Pulmonary veins: Normal variant anatomy, presence of a right middle pulmonary vein   Main Pulmonary artery: Dilation of the main pulmonary artery: severe at 38 mm   Trunk to aorta ratio of 1.1.   Left atrial appendage: Patent   Extra Cardiac Findings as per separate reporting.   IMPRESSION: 1. Severe Aortic stenosis. Findings pertinent to TAVR procedure are detailed above.   2. Patient's total coronary artery calcium score is 671.   3. Compared to 2021 Cardiac CT, further pulmonary artery dilation. No change in valve sizing recommendations.   3. Dilation of the main pulmonary artery. This can be associated with the presence of pulmonary hypertension; clinical correlation advised.   RECOMMENDATIONS:   The proposed cut-off value of 1,651 AU yielded a 93 % sensitivity and 75 % specificity in grading AS severity in patients with classical low-flow, low-gradient AS. Proposed different cut-off values to define severe AS for men and women as 2,065 AU and 1,274 AU, respectively. The joint European and American recommendations for the assessment of AS consider the aortic valve calcium score as a continuum - a very high calcium score suggests severe AS and a low calcium score suggests severe AS is unlikely.   Kerman Passey, et al. 2017 ESC/EACTS Guidelines for the management of valvular heart disease. Eur Heart J [phone]   Coronary artery calcium (CAC) score is a strong predictor of incident coronary heart disease (CHD) and provides predictive information beyond traditional risk factors. CAC scoring is reasonable to use in the decision to withhold, postpone, or initiate statin therapy in intermediate-risk or selected  borderline-risk asymptomatic adults (age 62-75 years and LDL-C >=70 to <190 mg/dL) who do not have diabetes or established atherosclerotic cardiovascular disease (ASCVD).* In intermediate-risk (10-year ASCVD risk >=7.5% to <20%) adults or selected borderline-risk (10-year ASCVD risk >=5% to <7.5%) adults in whom a CAC score is measured  for the purpose of making a treatment decision the following recommendations have been made:   If CAC = 0, it is reasonable to withhold statin therapy and reassess in 5 to 10 years, as long as higher risk conditions are absent (diabetes mellitus, family history of premature CHD in first degree relatives (males <55 years; females <65 years), cigarette smoking, LDL >=190 mg/dL or other independent risk factors).   If CAC is 1 to 99, it is reasonable to initiate statin therapy for patients >=29 years of age.   If CAC is >=100 or >=75th percentile, it is reasonable to initiate statin therapy at any age.   Cardiology referral should be considered for patients with CAC scores >=400 or >=75th percentile.   *2018 AHA/ACC/AACVPR/AAPA/ABC/ACPM/ADA/AGS/APhA/ASPC/NLA/PCNA Guideline on the Management of Blood Cholesterol: A Report of the American College of Cardiology/American Heart Association Task Force on Clinical Practice Guidelines. J Am Coll Cardiol. 2019;73(24):3168-3209.   Mahesh  Chandrasekhar     Electronically Signed   By: Riley Lam M.D.   On: 04/15/2022 13:18    Addended by Christell Constant, MD on 04/15/2022  1:20 PM   Study Result  Narrative & Impression  EXAM: OVER-READ INTERPRETATION  CT CHEST   The following report is a limited chest CT over-read performed by radiologist Dr. Allegra Lai of [facility] on 04/15/2022. This over-read does not include interpretation of cardiac or coronary anatomy or pathology. The cardiac CTA interpretation by the cardiologist is attached.   COMPARISON:  None  Available.   FINDINGS: Extracardiac findings will be described separately under dictation for contemporaneously obtained CTA chest, abdomen and pelvis.   IMPRESSION: Please see separate dictation for contemporaneously obtained CTA chest, abdomen and pelvis dated 04/15/2022 for full description of relevant extracardiac findings.   Electronically Signed: By: Allegra Lai M.D. On: 04/15/2022 11:13      Narrative & Impression  CLINICAL DATA:  Preop aortic valve replacement   EXAM: CT ANGIOGRAPHY CHEST, ABDOMEN AND PELVIS   TECHNIQUE: Non-contrast CT of the chest was initially obtained.   Multidetector CT imaging through the chest, abdomen and pelvis was performed using the standard protocol during bolus administration of intravenous contrast. Multiplanar reconstructed images and MIPs were obtained and reviewed to evaluate the vascular anatomy.   RADIATION DOSE REDUCTION: This exam was performed according to the departmental dose-optimization program which includes automated exposure control, adjustment of the mA and/or kV according to patient size and/or use of iterative reconstruction technique.   CONTRAST:  OMNIPAQUE IOHEXOL 350 MG/ML SOLN   COMPARISON:  Preop TAVR evaluation dated July 09, 2020; Dot[facility]ate PET-CT dated August 25, 2020   FINDINGS: CTA CHEST FINDINGS   Cardiovascular: Normal heart size. No pericardial effusion. Aortic valve thickening and calcifications. Coronary artery calcifications of the LAD and circumflex. Normal caliber thoracic aorta with severe atherosclerotic disease including ulcerated soft plaque.   Mediastinum/Nodes: Moderate hiatal hernia. No pathologically enlarged lymph nodes seen in the chest.   Lungs/Pleura: Central airways are patent. Mild bibasilar opacities. No consolidation, pleural effusion or pneumothorax. Stable solid pulmonary nodule of the right lower lobe measuring 4 mm on series 5, image 69, considered  benign.   Musculoskeletal: No chest wall abnormality. No acute or significant osseous findings.   CTA ABDOMEN AND PELVIS FINDINGS   Hepatobiliary: No focal liver abnormality is seen. Status post cholecystectomy. No biliary dilatation.   Pancreas: Hypervascular lesion of the pancreatic head measuring 1.0 cm on series 4, image 108, unchanged when compared with  prior. No main pancreatic duct dilation.   Spleen: Normal in size without focal abnormality.   Adrenals/Urinary Tract: Stable small adrenal gland nodule measuring 1.0 cm, likely an adenoma. No hydronephrosis or nephrolithiasis. Subcentimeter indeterminate upper pole left renal lesion measuring 9 mm on series 4, image 94, previously 7 mm. Low-attenuation lesion of the lower pole of the right kidney, likely a simple cyst. Additional small bilateral renal lesions are seen which are too small to completely characterize. Limited evaluation of the bladder due to streak artifact, within limitations there is bladder wall thickening.   Stomach/Bowel: Stomach is within normal limits. Diverticulosis. No evidence of bowel wall thickening, distention, or inflammatory changes.   Vascular/lymphatic: Severe atherosclerotic disease of the abdominal aorta consisting of calcified and noncalcified plaque. Mild narrowing at the origin of the celiac and left renal artery due to soft plaque. Mild narrowing of the distal SMA due to soft plaque. Pathologically enlarged lymph nodes seen in the abdomen or pelvis.   Reproductive: Prostate is unremarkable.   Other: Small fat containing right inguinal hernia. Small fat containing umbilical hernia.   Musculoskeletal: Left total hip arthroplasty. Levocurvature of the lumbar spine. No aggressive appearing osseous lesions.   VASCULAR MEASUREMENTS PERTINENT TO TAVR:   AORTA:   Minimal Aortic Diameter-12.1 mm   Severity of Aortic Calcification-severe   RIGHT PELVIS:   Right Common Iliac Artery  -   Minimal Diameter-8.0 mm   Tortuosity-mild   Calcification-moderate   Right External Iliac Artery -   Minimal Diameter-6.1 mm   Tortuosity-moderate   Calcification-mild   Right Common Femoral Artery -   Minimal Diameter-6.8 mm   Tortuosity-none   Calcification-mild   LEFT PELVIS:   Left Common Iliac Artery -   Minimal Diameter-6.8 mm   Tortuosity-moderate   Calcification-mild   Left External Iliac Artery -   Minimal Diameter-5.8 mm   Tortuosity-mild   Calcification-mild   Left Common Femoral Artery -   Evaluation limited due to streak artifact from hip arthroplasty.   Minimal Diameter-5.2 mm   Tortuosity-none   Calcification-mild   Review of the MIP images confirms the above findings.   IMPRESSION: Vascular:   1. Vascular findings and measurements pertinent to potential TAVR procedure, as detailed above. 2. Severe thickening and calcification of the aortic valve, compatible with reported clinical history of aortic stenosis. 3. Severe aortoiliac atherosclerosis. Three vessel coronary artery disease.   Nonvascular:   1. Stable hypervascular lesion of the pancreatic head, compatible with indolent endocrine neoplasm. See Dotatate PET-CT dated August 25, 2020 for further evaluation. 2. Subcentimeter indeterminate upper pole left renal lesion is slightly increased in size when compared with prior exam. Recommend renal protocol CT or MRI for further evaluation.     Electronically Signed   By: Yetta Glassman M.D.   On: 04/15/2022 12:21     Impression:  This 88 year old woman has stage D, critical aortic stenosis with NYHA class II symptoms of fatigue and generalized weakness as well as substernal chest discomfort.  I have personally reviewed her 2D echocardiogram, cardiac catheterization, and CTA studies.  Her echo shows a severely calcified aortic valve with restricted leaflet mobility.  The mean gradient was measured at 54 mmHg with a  peak gradient of 104 mmHg consistent with critical aortic stenosis.  Left ventricular ejection fraction is normal.  Cardiac catheterization in October 2021 showed mild nonobstructive coronary disease.  The mean gradient across her aortic valve at that time was 48 mmHg.  I agree that aortic valve  replacement is indicated in this patient for relief of her worsening symptoms and to prevent left ventricular dysfunction and death.  Given her advanced age and limited mobility due to being wheelchair-bound from severe lower extremity degenerative arthritis I think that transcatheter aortic valve replacement would be the only option for treating her.  Her gated cardiac CTA shows anatomy suitable for TAVR using a SAPIEN 3 valve.  Her abdominal and pelvic CTA shows adequate pelvic vascular anatomy to allow transfemoral insertion.  The patient and her daughter were counseled at length regarding treatment alternatives for management of severe symptomatic aortic stenosis. The risks and benefits of surgical intervention has been discussed in detail. Long-term prognosis with medical therapy was discussed. Alternative approaches such as conventional surgical aortic valve replacement, transcatheter aortic valve replacement, and palliative medical therapy were compared and contrasted at length. This discussion was placed in the context of the patient's own specific clinical presentation and past medical history. All of their questions have been addressed.   Following the decision to proceed with transcatheter aortic valve replacement, a discussion was held regarding what types of management strategies would be attempted intraoperatively in the event of life-threatening complications, including whether or not the patient would be considered a candidate for the use of cardiopulmonary bypass and/or conversion to open sternotomy for attempted surgical intervention.  I do not think she is a candidate for emergent sternotomy to manage  any intraoperative complications or cardiopulmonary bypass. The patient has been advised of a variety of complications that might develop including but not limited to risks of death, stroke, paravalvular leak, aortic dissection or other major vascular complications, aortic annulus rupture, device embolization, cardiac rupture or perforation, mitral regurgitation, acute myocardial infarction, arrhythmia, heart block or bradycardia requiring permanent pacemaker placement, congestive heart failure, respiratory failure, renal failure, pneumonia, infection, other late complications related to structural valve deterioration or migration, or other complications that might ultimately cause a temporary or permanent loss of functional independence or other long term morbidity. The patient provides full informed consent for the procedure as described and all questions were answered.      Plan:  She will be scheduled for transfemoral TAVR using a SAPIEN 3 valve on 05/18/2022.   I spent 60 minutes performing this consultation and > 50% of this time was spent face to face counseling and coordinating the care of this patient's critical symptomatic aortic stenosis.   Gaye Pollack, MD 05/12/2022

## 2022-05-13 NOTE — Pre-Procedure Instructions (Signed)
Surgical Instructions    Your procedure is scheduled on May 18, 2022.  Report to [facility] Main Entrance "A" at 10:15 A.M., then check in with the Admitting office.  Call this number if you have problems the morning of surgery:  [phone]   If you have any questions prior to your surgery date call [phone]: Open Monday-Friday 8am-4pm    Remember:  Do not eat or drink after midnight the night before your surgery   Continue taking all other medications including Aspirin without change through the day before surgery. On the morning of surgery take ONLY Levothyroxine with a sip of water.    As of today, STOP taking Aleve, Naproxen, Ibuprofen, Motrin, Advil, Goody's, BC's, all herbal medications, fish oil, and all vitamins.                     Do NOT Smoke (Tobacco/Vaping) for 24 hours prior to your procedure.  If you use a CPAP at night, you may bring your mask/headgear for your overnight stay.   Contacts, glasses, piercing's, hearing aid's, dentures or partials may not be worn into surgery, please bring cases for these belongings.    For patients admitted to the hospital, discharge time will be determined by your treatment team.   Patients discharged the day of surgery will not be allowed to drive home, and someone needs to stay with them for 24 hours.  SURGICAL WAITING ROOM VISITATION Patients having surgery or a procedure may have no more than 2 support people in the waiting area - these visitors may rotate.   Children under the age of 75 must have an adult with them who is not the patient. If the patient needs to stay at the hospital during part of their recovery, the visitor guidelines for inpatient rooms apply. Pre-op nurse will coordinate an appropriate time for 1 support person to accompany patient in pre-op.  This support person may not rotate.   Please refer to the [facility] website for the visitor guidelines for Inpatients (after your surgery is over and you  are in a regular room).    Special instructions:   [facility]- Preparing For Surgery  Before surgery, you can play an important role. Because skin is not sterile, your skin needs to be as free of germs as possible. You can reduce the number of germs on your skin by washing with CHG (chlorahexidine gluconate) Soap before surgery.  CHG is an antiseptic cleaner which kills germs and bonds with the skin to continue killing germs even after washing.    Oral Hygiene is also important to reduce your risk of infection.  Remember - BRUSH YOUR TEETH THE MORNING OF SURGERY WITH YOUR REGULAR TOOTHPASTE  Please do not use if you have an allergy to CHG or antibacterial soaps. If your skin becomes reddened/irritated stop using the CHG.  Do not shave (including legs and underarms) for at least 48 hours prior to first CHG shower. It is OK to shave your face.  Please follow these instructions carefully.   Shower the NIGHT BEFORE SURGERY and the MORNING OF SURGERY  If you chose to wash your hair, wash your hair first as usual with your normal shampoo.  After you shampoo, rinse your hair and body thoroughly to remove the shampoo.  Use CHG Soap as you would any other liquid soap. You can apply CHG directly to the skin and wash gently with a scrungie or a clean washcloth.   Apply the CHG  Soap to your body ONLY FROM THE NECK DOWN.  Do not use on open wounds or open sores. Avoid contact with your eyes, ears, mouth and genitals (private parts). Wash Face and genitals (private parts)  with your normal soap.   Wash thoroughly, paying special attention to the area where your surgery will be performed.  Thoroughly rinse your body with warm water from the neck down.  DO NOT shower/wash with your normal soap after using and rinsing off the CHG Soap.  Pat yourself dry with a CLEAN TOWEL.  Wear CLEAN PAJAMAS to bed the night before surgery  Place CLEAN SHEETS on your bed the night before your surgery  DO NOT  SLEEP WITH PETS.   Day of Surgery: Take a shower with CHG soap. Do not wear jewelry or makeup Do not wear lotions, powders, perfumes, or deodorant. Do not shave 48 hours prior to surgery.   Do not bring valuables to the hospital.  [facility] is not responsible for any belongings or valuables. Do not wear nail polish, gel polish, artificial nails, or any other type of covering on natural nails (fingers and toes) If you have artificial nails or gel coating that need to be removed by a nail salon, please have this removed prior to surgery. Artificial nails or gel coating may interfere with anesthesia's ability to adequately monitor your vital signs.  Wear Clean/Comfortable clothing the morning of surgery Remember to brush your teeth WITH YOUR REGULAR TOOTHPASTE.   Please read over the following fact sheets that you were given.    If you received a COVID test during your pre-op visit  it is requested that you wear a mask when out in public, stay away from anyone that may not be feeling well and notify your surgeon if you develop symptoms. If you have been in contact with anyone that has tested positive in the last 10 days please notify you surgeon.

## 2022-05-14 ENCOUNTER — Telehealth: Payer: Self-pay | Admitting: Cardiovascular Disease

## 2022-05-14 ENCOUNTER — Other Ambulatory Visit: Payer: Self-pay

## 2022-05-14 ENCOUNTER — Encounter (HOSPITAL_COMMUNITY): Payer: Self-pay

## 2022-05-14 ENCOUNTER — Encounter (HOSPITAL_COMMUNITY)
Admission: RE | Admit: 2022-05-14 | Discharge: 2022-05-14 | Disposition: A | Discharge status: Home / Self Care | Payer: Medicare Other | Source: Ambulatory Visit | Attending: Cardiovascular Disease | Admitting: Cardiovascular Disease

## 2022-05-14 ENCOUNTER — Ambulatory Visit (HOSPITAL_COMMUNITY)
Admission: RE | Admit: 2022-05-14 | Discharge: 2022-05-14 | Disposition: A | Discharge status: Home / Self Care | Payer: Medicare Other | Source: Ambulatory Visit | Attending: Cardiovascular Disease | Admitting: Cardiovascular Disease

## 2022-05-14 VITALS — BP 145/58 | HR 60 | Temp 98.2°F | Resp 18 | Ht 69.0 in | Wt 200.0 lb

## 2022-05-14 DIAGNOSIS — J9811 Atelectasis: Secondary | ICD-10-CM | POA: Diagnosis not present

## 2022-05-14 DIAGNOSIS — Z01818 Encounter for other preprocedural examination: Secondary | ICD-10-CM | POA: Insufficient documentation

## 2022-05-14 DIAGNOSIS — Z20822 Contact with and (suspected) exposure to covid-19: Secondary | ICD-10-CM | POA: Diagnosis not present

## 2022-05-14 DIAGNOSIS — I350 Nonrheumatic aortic (valve) stenosis: Secondary | ICD-10-CM | POA: Diagnosis not present

## 2022-05-14 LAB — CBC
HCT: 39.1 % (ref 36.0–46.0)
Hemoglobin: 12.5 g/dL (ref 12.0–15.0)
MCH: 28.5 pg (ref 26.0–34.0)
MCHC: 32 g/dL (ref 30.0–36.0)
MCV: 89.1 fL (ref 80.0–100.0)
Platelets: 240 10*3/uL (ref 150–400)
RBC: 4.39 MIL/uL (ref 3.87–5.11)
RDW: 14 % (ref 11.5–15.5)
WBC: 7.8 10*3/uL (ref 4.0–10.5)
nRBC: 0 % (ref 0.0–0.2)

## 2022-05-14 LAB — COMPREHENSIVE METABOLIC PANEL
ALT: 14 U/L (ref 0–44)
AST: 23 U/L (ref 15–41)
Albumin: 3.3 g/dL — ABNORMAL LOW (ref 3.5–5.0)
Alkaline Phosphatase: 64 U/L (ref 38–126)
Anion gap: 8 (ref 5–15)
BUN: 14 mg/dL (ref 8–23)
CO2: 28 mmol/L (ref 22–32)
Calcium: 9 mg/dL (ref 8.9–10.3)
Chloride: 105 mmol/L (ref 98–111)
Creatinine, Ser: 0.68 mg/dL (ref 0.44–1.00)
GFR, Estimated: >60 mL/min (ref >60)
Glucose, Bld: 87 mg/dL (ref 70–99)
Potassium: 3.6 mmol/L (ref 3.5–5.1)
Sodium: 141 mmol/L (ref 135–145)
Total Bilirubin: 0.7 mg/dL (ref 0.3–1.2)
Total Protein: 5.9 g/dL — ABNORMAL LOW (ref 6.5–8.1)

## 2022-05-14 LAB — SARS CORONAVIRUS 2 (TAT 6-24 HRS): SARS Coronavirus 2: NEGATIVE

## 2022-05-14 LAB — SURGICAL PCR SCREEN
MRSA, PCR: NEGATIVE
Staphylococcus aureus: POSITIVE — AB

## 2022-05-14 LAB — URINALYSIS, ROUTINE W REFLEX MICROSCOPIC
Bilirubin Urine: NEGATIVE
Glucose, UA: NEGATIVE mg/dL
Hgb urine dipstick: NEGATIVE
Ketones, ur: NEGATIVE mg/dL
Nitrite: NEGATIVE
Protein, ur: NEGATIVE mg/dL
Specific Gravity, Urine: 1.015 (ref 1.005–1.030)
pH: 8.5 — ABNORMAL HIGH (ref 5.0–8.0)

## 2022-05-14 LAB — URINALYSIS, MICROSCOPIC (REFLEX): RBC / HPF: NONE SEEN RBC/hpf (ref 0–5)

## 2022-05-14 LAB — PROTIME-INR
INR: 1 (ref 0.8–1.2)
Prothrombin Time: 13.3 seconds (ref 11.4–15.2)

## 2022-05-14 NOTE — Progress Notes (Signed)
Dr. Gibson Ramp office notified of abnormal UA

## 2022-05-14 NOTE — Progress Notes (Signed)
PCP - Dr. Assunta Found Cardiologist - Dr. Nona Dell  PPM/ICD - denies   Chest x-ray - 05/14/22 EKG - 05/14/22 Stress Test - 08/31/16 ECHO - 03/11/22 Cardiac Cath - 07/04/20  Sleep Study - denies  DM- denies  Blood Thinner Instructions: n/a Aspirin Instructions: Continue ASA thru day before surgery  ERAS Protcol - no, NPO   COVID TEST- 05/14/22   Anesthesia review: yes, cardiac hx  Patient denies shortness of breath, fever, cough and chest pain at PAT appointment   All instructions explained to the patient, with a verbal understanding of the material. Patient agrees to go over the instructions while at home for a better understanding. Patient also instructed to wear a mask in public after being tested for COVID-19. The opportunity to ask questions was provided.

## 2022-05-14 NOTE — Telephone Encounter (Signed)
   [name] at the preadmission called, she said pt's urinalisys came back and  pt has moderate leukocyte

## 2022-05-14 NOTE — Telephone Encounter (Signed)
See result note. Reviewed by Georgie Chard, NP. No new orders.

## 2022-05-17 ENCOUNTER — Inpatient Hospital Stay (HOSPITAL_COMMUNITY): Payer: Medicare Other | Admitting: Certified Registered Nurse Anesthetist

## 2022-05-17 ENCOUNTER — Inpatient Hospital Stay (HOSPITAL_COMMUNITY): Payer: Medicare Other | Admitting: Physician Assistant

## 2022-05-17 MED ORDER — HEPARIN 30,000 UNITS/1000 ML (OHS) CELLSAVER SOLUTION
Status: DC
  Filled 2022-05-17: qty 1000

## 2022-05-17 MED ORDER — MAGNESIUM SULFATE 50 % IJ SOLN
40.0000 meq | INTRAMUSCULAR | Status: DC
  Filled 2022-05-17: qty 9.85

## 2022-05-17 MED ORDER — NOREPINEPHRINE 4 MG/250ML-% IV SOLN
0.0000 ug/min | INTRAVENOUS | Status: DC
  Filled 2022-05-17: qty 250

## 2022-05-17 MED ORDER — CEFAZOLIN SODIUM-DEXTROSE 2-4 GM/100ML-% IV SOLN
2.0000 g | INTRAVENOUS | Status: DC
  Filled 2022-05-17: qty 100

## 2022-05-17 MED ORDER — POTASSIUM CHLORIDE 2 MEQ/ML IV SOLN
80.0000 meq | INTRAVENOUS | Status: DC
  Filled 2022-05-17: qty 40

## 2022-05-17 MED ORDER — DEXMEDETOMIDINE HCL IN NACL 400 MCG/100ML IV SOLN
0.1000 ug/kg/h | INTRAVENOUS | Status: DC
  Filled 2022-05-17: qty 100

## 2022-05-17 NOTE — H&P (Signed)
[name]       [name],[name] [id number]             [phone]      Cardiothoracic Surgery Admission History and Physical   PCP is Paige Sites, MD Referring Provider is [name] Primary Cardiologist is Paige Lesches, MD   Reason for admission:  critical aortic stenosis   HPI:   The patient is an 88 year old woman with a history of severe degenerative arthritis of the knees with very limited mobility and wheelchair-bound, incontinence, hyperlipidemia, hypothyroidism, anxiety/depression, chronic bronchitis, coronary artery disease, and severe aortic stenosis who I initially evaluated on 08/27/2020 for consideration of TAVR.  At that time she looked very frail and it was not clear that she was having significant symptoms and how much TAVR would help her.  We decided to continue following her aortic stenosis.  Her most recent echocardiogram on 03/11/2022 shows an increase in the mean gradient to 68 mmHg with a peak gradient of 104 mmHg.  Left ventricular ejection fraction is 65 to 70% with severe LVH.  She reports having recent episodes of substernal chest discomfort in the center of her chest that last about 30 minutes.  She has had progressive fatigue and generalized weakness.  She denies any dizziness or syncope.  She has had developed some lower extremity edema. She has no orthopnea.   She remains confined to a wheelchair due to severe lower extremity arthritis and inability to support her weight.  Despite being confined to a wheelchair she lives alone and still manages to do her housework, laundry, and take care of herself.  She is able to transfer from her wheelchair on her own.  She has 2 dogs and 2 cats that she cares for.           Past Medical History:  Diagnosis Date   Anxiety     Aortic stenosis     Arthritis     CAD (coronary artery disease)      Mild at cardiac catheterization 2018   Chronic bronchitis     Constipation      Depression     Hyperlipidemia     Hypothyroidism             Past Surgical History:  Procedure Laterality Date   CARDIAC CATHETERIZATION   07/04/2020   CHOLECYSTECTOMY N/A 02/15/2017    Procedure: LAPAROSCOPIC CHOLECYSTECTOMY;  Surgeon: Paige Ok, MD;  [facility]: [name];  Service: General;  Laterality: N/A;   CORONARY ANGIOGRAPHY N/A 02/14/2017    Procedure: Coronary Angiography;  Surgeon: Paige Bush, MD;  Location: [name];  Service: Cardiovascular;  Laterality: N/A;   EYE SURGERY   2009    bilateral   HEMATOMA EVACUATION   06/04/2011    Procedure: EVACUATION HEMATOMA;  Surgeon: Paige Abbott, MD;  Location: [name];  Service: Orthopedics;  Laterality: Right;  evacuation of seroma   JOINT REPLACEMENT   05/2010    left total knee [name]   Left hip replacement @ [name] '98       Left total knee       PATELLAR TENDON REPAIR   06/04/2011    Procedure: PATELLA TENDON REPAIR;  Surgeon: Paige Abbott, MD;  [facility]: [name];  Service: Orthopedics;  Laterality: Right;   PATELLAR TENDON REPAIR   08/30/2011    Procedure: PATELLA TENDON REPAIR;  Surgeon: Paige Abbott, MD;  Location: [name];  Service: Orthopedics;  Laterality: Right;  Allograft Reconstrucation Right Patella Tendon   REPLACEMENT TOTAL KNEE BILATERAL       RIGHT/LEFT HEART CATH AND CORONARY ANGIOGRAPHY N/A 07/04/2020    Procedure: RIGHT/LEFT HEART CATH AND CORONARY ANGIOGRAPHY;  Surgeon: Burnell Blanks, MD;  Location: [facility];  Service: Cardiovascular;  Laterality: N/A;   TOTAL HIP ARTHROPLASTY       TOTAL KNEE ARTHROPLASTY   05/03/2011    Procedure: TOTAL KNEE ARTHROPLASTY;  Surgeon: Paige Abbott, MD;  Location: [name];  Service: Orthopedics;  Laterality: Right;  With [name]   TOTAL THYROIDECTOMY   March 2011    Dr. Benjamine Chen @ [name]   TUBAL LIGATION               Family History  Problem Relation Age of Onset   Colon cancer Mother     Blindness Sister     Anesthesia problems Neg Hx      Hypotension Neg Hx     Malignant hyperthermia Neg Hx     Pseudochol deficiency Neg Hx        Social History         Socioeconomic History   Marital status: Widowed      Spouse name: Not on file   Number of children: 4   Years of education: Not on file   Highest education level: Not on file  Occupational History   Occupation: Retired-Office work  Tobacco Use   Smoking status: Former      Types: Cigarettes      Quit date: 09/27/1978      Years since quitting: 43.6   Smokeless tobacco: Never  Vaping Use   Vaping Use: Never used  Substance and Sexual Activity   Alcohol use: No   Drug use: No   Sexual activity: Not Currently  Other Topics Concern   Not on file  Social History Narrative    ** Merged History Encounter **         Social Determinants of Health    Financial Resource Strain: Not on file  Food Insecurity: Not on file  Transportation Needs: Not on file  Physical Activity: Not on file  Stress: Not on file  Social Connections: Not on file  Intimate Partner Violence: Not on file             Prior to Admission medications   Medication Sig Start Date End Date Taking? Authorizing Provider  acetaminophen (TYLENOL) 650 MG CR tablet Take 650 mg by mouth every 8 (eight) hours as needed for pain.     Yes [provider]  aspirin EC 81 MG tablet Take 81 mg by mouth daily.     Yes [provider]  Cholecalciferol (D3 HIGH POTENCY) 50 MCG (2000 UT) CAPS Take 2,000 Units by mouth daily.     Yes [provider]  ibuprofen (ADVIL,MOTRIN) 200 MG tablet Take 600 mg by mouth every 8 (eight) hours as needed for moderate pain (shoulder pain).     Yes [provider]  levothyroxine (SYNTHROID) 125 MCG tablet Take 125 mcg by mouth daily before breakfast.     Yes [provider]  mineral oil-hydrophilic petrolatum (AQUAPHOR) ointment Apply 1 Application topically as needed for irritation.     Yes [provider]   neomycin-bacitracin-polymyxin (NEOSPORIN) OINT Apply 1 Application topically as needed for wound care.     Yes [provider]  nitroGLYCERIN (NITROSTAT) 0.4 MG SL tablet Place 1 tablet (0.4 mg total) under the tongue every  5 (five) minutes as needed for chest pain. 03/09/22   Yes Strader, [place], PA-C  Nystatin POWD Apply 1 Application topically as needed (yeast).     Yes [provider]  oxybutynin (DITROPAN XL) 15 MG 24 hr tablet Take 15 mg by mouth daily.     Yes [provider]  PARoxetine (PAXIL) 20 MG tablet Take 20 mg by mouth daily.     Yes [provider]            Current Outpatient Medications  Medication Sig Dispense Refill   acetaminophen (TYLENOL) 650 MG CR tablet Take 650 mg by mouth every 8 (eight) hours as needed for pain.       aspirin EC 81 MG tablet Take 81 mg by mouth daily.       Cholecalciferol (D3 HIGH POTENCY) 50 MCG (2000 UT) CAPS Take 2,000 Units by mouth daily.       ibuprofen (ADVIL,MOTRIN) 200 MG tablet Take 600 mg by mouth every 8 (eight) hours as needed for moderate pain (shoulder pain).       levothyroxine (SYNTHROID) 125 MCG tablet Take 125 mcg by mouth daily before breakfast.       mineral oil-hydrophilic petrolatum (AQUAPHOR) ointment Apply 1 Application topically as needed for irritation.       neomycin-bacitracin-polymyxin (NEOSPORIN) OINT Apply 1 Application topically as needed for wound care.       nitroGLYCERIN (NITROSTAT) 0.4 MG SL tablet Place 1 tablet (0.4 mg total) under the tongue every 5 (five) minutes as needed for chest pain. 25 tablet 3   Nystatin POWD Apply 1 Application topically as needed (yeast).       oxybutynin (DITROPAN XL) 15 MG 24 hr tablet Take 15 mg by mouth daily.       PARoxetine (PAXIL) 20 MG tablet Take 20 mg by mouth daily.        No current facility-administered medications for this visit.      No Known Allergies       Review of Systems:   no             Cardiac:                        + chest pain with exertion, + chest pain at rest, no SOB with exertion, no resting SOB, no PND, no orthopnea, no palpitations, no arrhythmia, no atrial fibrillation, + LE edema, no dizzy spells, no syncope             Respiratory:                 no shortness of breath, no home oxygen, no productive cough, no dry cough, no bronchitis, no wheezing, no hemoptysis, on asthma, no pain with inspiration or cough, no sleep apnea, no CPAP at night             GI:                               no difficulty swallowing, no reflux, no frequent heartburn, no hiatal hernia, no abdominal pain, no constipation, no diarrhea, no hematochezia, no hematemesis, no melena             GU:                              no dysuria,  +  frequency, + incontinence, no urinary tract infection, no hematuria, no kidney stones, no kidney disease             Vascular:                     no pain suggestive of claudication, no pain in feet, no leg cramps, + varicose veins, no DVT, no non-healing foot ulcer             Neuro:                         no stroke, no TIA's, no seizures, no headaches, no temporary blindness one eye,  no slurred speech, no peripheral neuropathy, no chronic pain, no instability of gait, no memory/cognitive dysfunction             Musculoskeletal:         + arthritis of knees, no joint swelling, no myalgias, + can't support weight to stand or walk, + decreased mobility              Skin:                            no rash, no itching, no skin infections, no pressure sores or ulcerations             Psych:                         no anxiety, no depression, no nervousness, no unusual recent stress             Eyes:                           no blurry vision, no floaters, no recent vision changes, + wears glasses             ENT:                            no hearing loss, no loose or painful teeth, no dentures, edentulous             Hematologic:               + easy bruising, no abnormal bleeding, no  clotting disorder, no frequent epistaxis             Endocrine:                   no diabetes, does not check CBG's at home                            Physical Exam:               BP (!) 175/78   Pulse 68   Resp 20   Ht 5\' 9"  (1.753 m)   Wt 200 lb (90.7 kg)   SpO2 95% Comment: RA  BMI 29.53 kg/m              General:                      In wheelchair but alert and conversant             HEENT:  Unremarkable, NCAT, PERLA, EOMI             Neck:                           no JVD, no bruits, no adenopathy              Chest:                          clear to auscultation, symmetrical breath sounds, no wheezes, no rhonchi              CV:                              RRR, 3/6 systolic murmur RSB             Abdomen:                    soft, non-tender, no masses              Extremities:                 warm, well-perfused, pulses palpable at ankles, mild lower extremity edema             Rectal/GU                   Deferred             Neuro:                         Grossly non-focal and symmetrical throughout             Skin:                            Clean and dry, no rashes, no breakdown   Diagnostic Tests:   ECHOCARDIOGRAM REPORT         Patient Name:   [name] Date of Exam: 03/11/2022  Medical Rec #:  [id number]           Height:       69.5 in  Accession #:    [id number]          Weight:       220.2 lb  Date of Birth:  [birth year]           BSA:          2.163 m  Patient Age:    88 years            BP:           168/82 mmHg  Patient Gender: F                   HR:           82 bpm.  Exam Location:  [place]   Procedure: Cardiac Doppler and Color Doppler   Indications:    I35.0 Nonrheumatic aortic (valve) stenosis     History:        Patient has prior history of Echocardiogram examinations,  most                  recent 06/16/2020. CAD and Previous Myocardial Infarction,  Aortic  Valve Disease, Signs/Symptoms:Chest Pain; Risk                   Factors:Hypertension and Former Smoker.     Sonographer:    Estill Bamberg McFatter RDMS, RVT, RDCS  Referring Phys: 12 SAMUEL G MCDOWELL      Sonographer Comments: Global longitudinal strain was attempted.  IMPRESSIONS     1. Dynamic LVOT gradient in setting of severe symmetric LVH and  hyperdynamic LV function. Resting gradient 35 mmHg, Valsalva not  performed. . Left ventricular ejection fraction, by estimation, is 65 to  70%. The left ventricle has normal function. Left  ventricular endocardial border not optimally defined to evaluate regional  wall motion. There is severe left ventricular hypertrophy. Left  ventricular diastolic parameters are consistent with Grade I diastolic  dysfunction (impaired relaxation). Elevated  left atrial pressure.   2. Right ventricular systolic function is normal. The right ventricular  size is normal. Tricuspid regurgitation signal is inadequate for assessing  PA pressure.   3. Left atrial size was mild to moderately dilated.   4. The mitral valve is normal in structure. No evidence of mitral valve  regurgitation. Mild mitral stenosis.   5. Severe aortic stenosis by mean gradient. Unable to calculate AVA due  to significant pulse wave Doppler aliasing and inability to measure LVOT  vmax and VTI. Max mean gradient is with Pedhoff probe measuring 68 mmHg. .  The aortic valve has an  indeterminant number of cusps. There is moderate calcification of the  aortic valve. There is moderate thickening of the aortic valve. Aortic  valve regurgitation is trivial. Severe aortic valve stenosis.   6. The inferior vena cava is normal in size with greater than 50%  respiratory variability, suggesting right atrial pressure of 3 mmHg.   Comparison(s): Echocardiogram done 06/16/20 showed an EF of 65-70% with  severe AS and an AV Mean Grad of 42 mmHg.   FINDINGS   Left Ventricle: Dynamic LVOT gradient in setting of severe symmetric LVH  and  hyperdynamic LV function. Resting gradient 35 mmHg, Valsalva not  performed. Left ventricular ejection fraction, by estimation, is 65 to  70%. The left ventricle has normal  function. Left ventricular endocardial border not optimally defined to  evaluate regional wall motion. Global longitudinal strain performed but  not reported based on interpreter judgement due to suboptimal tracking.  The left ventricular internal cavity  size was normal in size. There is severe left ventricular hypertrophy.  Left ventricular diastolic parameters are consistent with Grade I  diastolic dysfunction (impaired relaxation). Elevated left atrial  pressure.   Right Ventricle: The right ventricular size is normal. Right vetricular  wall thickness was not well visualized. Right ventricular systolic  function is normal. Tricuspid regurgitation signal is inadequate for  assessing PA pressure.   Left Atrium: Left atrial size was mild to moderately dilated.   Right Atrium: Right atrial size was normal in size.   Pericardium: There is no evidence of pericardial effusion.   Mitral Valve: The mitral valve is normal in structure. There is mild  thickening of the mitral valve leaflet(s). There is mild calcification of  the mitral valve leaflet(s). Mild to moderate mitral annular  calcification. No evidence of mitral valve  regurgitation. Mild mitral valve stenosis. The mean mitral valve gradient  is 3.0 mmHg.   Tricuspid Valve: The tricuspid valve is not well visualized. Tricuspid  valve regurgitation is mild . No evidence of tricuspid stenosis.   Aortic Valve: Severe  aortic stenosis by mean gradient. Unable to calculate  AVA due to significant pulse wave Doppler aliasing and inability to  measure LVOT vmax and VTI. Max mean gradient is with Pedhoff probe  measuring 68 mmHg. The aortic valve has an  indeterminant number of cusps. There is moderate calcification of the  aortic valve. There is moderate  thickening of the aortic valve. There is  moderate aortic valve annular calcification. Aortic valve regurgitation is  trivial. Severe aortic stenosis is  present. Aortic valve mean gradient measures 54.0 mmHg. Aortic valve peak  gradient measures 104.3 mmHg.   Pulmonic Valve: The pulmonic valve was not well visualized. Pulmonic valve  regurgitation is not visualized. No evidence of pulmonic stenosis.   Aorta: The aortic root is normal in size and structure.   Venous: The inferior vena cava is normal in size with greater than 50%  respiratory variability, suggesting right atrial pressure of 3 mmHg.   IAS/Shunts: No atrial level shunt detected by color flow Doppler.      LEFT VENTRICLE  PLAX 2D  LVIDd:         1.90 cm     Diastology  LVIDs:         1.37 cm     LV e' medial:    5.00 cm/s  LV PW:         1.72 cm     LV E/e' medial:  19.5  LV IVS:        1.70 cm     LV e' lateral:   5.22 cm/s  LVOT diam:     2.00 cm     LV E/e' lateral: 18.6  LVOT Area:     3.14 cm     LV Volumes (MOD)  LV vol d, MOD A2C: 69.9 ml  LV vol d, MOD A4C: 51.8 ml  LV vol s, MOD A2C: 26.9 ml  LV vol s, MOD A4C: 14.1 ml  LV SV MOD A2C:     43.0 ml  LV SV MOD A4C:     51.8 ml  LV SV MOD BP:      40.6 ml   RIGHT VENTRICLE  RV S prime:     15.10 cm/s  TAPSE (M-mode): 2.0 cm   LEFT ATRIUM              Index        RIGHT ATRIUM           Index  LA diam:        4.00 cm  1.85 cm/m   RA Area:     18.40 cm  LA Vol (A2C):   74.1 ml  34.26 ml/m  RA Volume:   56.90 ml  26.31 ml/m  LA Vol (A4C):   111.5 ml 51.55 ml/m  LA Biplane Vol: 91.0 ml  42.07 ml/m   AORTIC VALVE  AV Vmax:           510.60 cm/s  AV Vmean:          341.600 cm/s  AV VTI:            0.996 m  AV Peak Grad:      104.3 mmHg  AV Mean Grad:      54.0 mmHg  AR Vena Contracta: 0.25 cm     AORTA  Ao Root diam: 2.80 cm  Ao Asc diam:  3.20 cm   MITRAL VALVE  MV Area (PHT): 2.55 cm     SHUNTS  MV Mean grad:  3.0 mmHg     Systemic Diam:  2.00 cm  MV Decel Time: 298 msec  MV E velocity: 97.30 cm/s  MV A velocity: 144.00 cm/s  MV E/A ratio:  0.68   Dina Rich MD  Electronically signed by Dina Rich MD  Signature Date/Time: 03/11/2022/7:29:25 PM         Final       ADDENDUM REPORT: 04/15/2022 13:18   CLINICAL DATA:  Aortic Valve pathology with assessment for TAVR   EXAM: Cardiac TAVR CT   TECHNIQUE: The patient was scanned on a Siemens Force 192 slice scanner. A 120 kV retrospective scan was triggered in the descending thoracic aorta at 111 HU's. Gantry rotation speed was 270 msecs and collimation was .9 mm. No beta blockade or nitro were given. The 3D data set was reconstructed in 5% intervals of the R-R cycle. Systolic and diastolic phases were analyzed on a dedicated work station using MPR, MIP and VRT modes. The patient received 100 cc of contrast.   FINDINGS: Aortic Valve: Severely thickened tri-leaflet aortic valve with heavy calcification and reduced excursion the planimeter valve area is 0.798 Sq cm consistent with severe aortic stenosis   Annular calcification: Severe based on presence of LVOT calcification   Aortic Valve Calcium Score: 5748   Presence of basal septal hypertrophy: Yes, severe concentric hypertrophy septal thickness 21 mm in short axis assessment.   There is no systolic anterior motion of the mitral valve on cine assessment.   Systolic measurements greater than diastolic measurements, systolic measurements used (see below)   Perimembranous septal diameter: 12 mm   Mitral Valve: Moderate mitral annular calcification with calcified anterior and posterior mitral valve leaflets.   Aortic Annulus Measurements- 10 %   Major annulus diameter: 28 mm   Minor annulus diameter: 25 mm   Annular perimeter: 82 mm   Annular area: 5.19 cm2   Aortic Root Measurements   Sinotubular Junction: 30 mm   Ascending Thoracic Aorta: 33 mm   Aortic Arch: 26 mm   Descending  Thoracic Aorta: 28 mm   Aortic atherosclerosis.   Sinus of Valsalva Measurements:   Right coronary cusp width: 30 mm   Left coronary cusp width: 30 mm   Non coronary cusp width: 32 mm   Mean diameter: 31 mm   Coronary Artery Height above Annulus:   Left Main: 16 mm   Left SoV height: 25 mm   Right Coronary: 20 mm   Right SoV height: 25 mm   Optimum Fluoroscopic Angle for Delivery: LAO 3, CAU 1   Valves for structural team consideration:   26 mm Edward Sapien Valve   Sufficient coronary height, SoV height and diameter for 29 mm CoreValve   Sapien valve may be favored in the setting of left coronary calcium.   Non TAVR Valve Findings:   Coronary Arteries: Normal coronary origin. Small non dominant RCA with no coronary calcium. Study not completed with nitroglycerin. Unable to fully assess mid LAD and mid LCX.   Coronary Calcium Score:   Left main: 0   Left anterior descending artery: 437   Left circumflex artery: 234   Right coronary artery: 0   Total: 671   Interatrial septum: No interatrial communication   Pulmonary veins: Normal variant anatomy, presence of a right middle pulmonary vein   Main Pulmonary artery: Dilation of the main pulmonary artery: severe at 38 mm   Trunk to aorta ratio of 1.1.   Left atrial  appendage: Patent   Extra Cardiac Findings as per separate reporting.   IMPRESSION: 1. Severe Aortic stenosis. Findings pertinent to TAVR procedure are detailed above.   2. Patient's total coronary artery calcium score is 671.   3. Compared to 2021 Cardiac CT, further pulmonary artery dilation. No change in valve sizing recommendations.   3. Dilation of the main pulmonary artery. This can be associated with the presence of pulmonary hypertension; clinical correlation advised.   RECOMMENDATIONS:   The proposed cut-off value of 1,651 AU yielded a 93 % sensitivity and 75 % specificity in grading AS severity in patients  with classical low-flow, low-gradient AS. Proposed different cut-off values to define severe AS for men and women as 2,065 AU and 1,274 AU, respectively. The joint European and American recommendations for the assessment of AS consider the aortic valve calcium score as a continuum - a very high calcium score suggests severe AS and a low calcium score suggests severe AS is unlikely.   Kerman Passey, et al. 2017 ESC/EACTS Guidelines for the management of valvular heart disease. Eur Heart J [phone]   Coronary artery calcium (CAC) score is a strong predictor of incident coronary heart disease (CHD) and provides predictive information beyond traditional risk factors. CAC scoring is reasonable to use in the decision to withhold, postpone, or initiate statin therapy in intermediate-risk or selected borderline-risk asymptomatic adults (age 27-75 years and LDL-C >=70 to <190 mg/dL) who do not have diabetes or established atherosclerotic cardiovascular disease (ASCVD).* In intermediate-risk (10-year ASCVD risk >=7.5% to <20%) adults or selected borderline-risk (10-year ASCVD risk >=5% to <7.5%) adults in whom a CAC score is measured for the purpose of making a treatment decision the following recommendations have been made:   If CAC = 0, it is reasonable to withhold statin therapy and reassess in 5 to 10 years, as long as higher risk conditions are absent (diabetes mellitus, family history of premature CHD in first degree relatives (males <55 years; females <65 years), cigarette smoking, LDL >=190 mg/dL or other independent risk factors).   If CAC is 1 to 99, it is reasonable to initiate statin therapy for patients >=51 years of age.   If CAC is >=100 or >=75th percentile, it is reasonable to initiate statin therapy at any age.   Cardiology referral should be considered for patients with CAC scores >=400 or >=75th percentile.   *2018  AHA/ACC/AACVPR/AAPA/ABC/ACPM/ADA/AGS/APhA/ASPC/NLA/PCNA Guideline on the Management of Blood Cholesterol: A Report of the American College of Cardiology/American Heart Association Task Force on Clinical Practice Guidelines. J Am Coll Cardiol. 2019;73(24):3168-3209.   Mahesh  Chandrasekhar     Electronically Signed   By: Rudean Haskell M.D.   On: 04/15/2022 13:18    Addended by Werner Lean, MD on 04/15/2022  1:20 PM    Study Result   Narrative & Impression  EXAM: OVER-READ INTERPRETATION  CT CHEST   The following report is a limited chest CT over-read performed by radiologist Dr. Yetta Glassman of [facility], [place] on 04/15/2022. This over-read does not include interpretation of cardiac or coronary anatomy or pathology. The cardiac CTA interpretation by the cardiologist is attached.   COMPARISON:  None Available.   FINDINGS: Extracardiac findings will be described separately under dictation for contemporaneously obtained CTA chest, abdomen and pelvis.   IMPRESSION: Please see separate dictation for contemporaneously obtained CTA chest, abdomen and pelvis dated 04/15/2022 for full description of relevant extracardiac findings.   Electronically Signed: By: Denny Peon  Strickland M.D. On: 04/15/2022 11:13        Narrative & Impression  CLINICAL DATA:  Preop aortic valve replacement   EXAM: CT ANGIOGRAPHY CHEST, ABDOMEN AND PELVIS   TECHNIQUE: Non-contrast CT of the chest was initially obtained.   Multidetector CT imaging through the chest, abdomen and pelvis was performed using the standard protocol during bolus administration of intravenous contrast. Multiplanar reconstructed images and MIPs were obtained and reviewed to evaluate the vascular anatomy.   RADIATION DOSE REDUCTION: This exam was performed according to the departmental dose-optimization program which includes automated exposure control, adjustment of the mA and/or kV according  to patient size and/or use of iterative reconstruction technique.   CONTRAST:  11mL OMNIPAQUE IOHEXOL 350 MG/ML SOLN   COMPARISON:  Preop TAVR evaluation dated July 09, 2020; Dotatate PET-CT dated August 25, 2020   FINDINGS: CTA CHEST FINDINGS   Cardiovascular: Normal heart size. No pericardial effusion. Aortic valve thickening and calcifications. Coronary artery calcifications of the LAD and circumflex. Normal caliber thoracic aorta with severe atherosclerotic disease including ulcerated soft plaque.   Mediastinum/Nodes: Moderate hiatal hernia. No pathologically enlarged lymph nodes seen in the chest.   Lungs/Pleura: Central airways are patent. Mild bibasilar opacities. No consolidation, pleural effusion or pneumothorax. Stable solid pulmonary nodule of the right lower lobe measuring 4 mm on series 5, image 69, considered benign.   Musculoskeletal: No chest wall abnormality. No acute or significant osseous findings.   CTA ABDOMEN AND PELVIS FINDINGS   Hepatobiliary: No focal liver abnormality is seen. Status post cholecystectomy. No biliary dilatation.   Pancreas: Hypervascular lesion of the pancreatic head measuring 1.0 cm on series 4, image 108, unchanged when compared with prior. No main pancreatic duct dilation.   Spleen: Normal in size without focal abnormality.   Adrenals/Urinary Tract: Stable small adrenal gland nodule measuring 1.0 cm, likely an adenoma. No hydronephrosis or nephrolithiasis. Subcentimeter indeterminate upper pole left renal lesion measuring 9 mm on series 4, image 94, previously 7 mm. Low-attenuation lesion of the lower pole of the right kidney, likely a simple cyst. Additional small bilateral renal lesions are seen which are too small to completely characterize. Limited evaluation of the bladder due to streak artifact, within limitations there is bladder wall thickening.   Stomach/Bowel: Stomach is within normal limits. Diverticulosis.  No evidence of bowel wall thickening, distention, or inflammatory changes.   Vascular/lymphatic: Severe atherosclerotic disease of the abdominal aorta consisting of calcified and noncalcified plaque. Mild narrowing at the origin of the celiac and left renal artery due to soft plaque. Mild narrowing of the distal SMA due to soft plaque. Pathologically enlarged lymph nodes seen in the abdomen or pelvis.   Reproductive: Prostate is unremarkable.   Other: Small fat containing right inguinal hernia. Small fat containing umbilical hernia.   Musculoskeletal: Left total hip arthroplasty. Levocurvature of the lumbar spine. No aggressive appearing osseous lesions.   VASCULAR MEASUREMENTS PERTINENT TO TAVR:   AORTA:   Minimal Aortic Diameter-12.1 mm   Severity of Aortic Calcification-severe   RIGHT PELVIS:   Right Common Iliac Artery -   Minimal Diameter-8.0 mm   Tortuosity-mild   Calcification-moderate   Right External Iliac Artery -   Minimal Diameter-6.1 mm   Tortuosity-moderate   Calcification-mild   Right Common Femoral Artery -   Minimal Diameter-6.8 mm   Tortuosity-none   Calcification-mild   LEFT PELVIS:   Left Common Iliac Artery -   Minimal Diameter-6.8 mm   Tortuosity-moderate   Calcification-mild   Left  External Iliac Artery -   Minimal Diameter-5.8 mm   Tortuosity-mild   Calcification-mild   Left Common Femoral Artery -   Evaluation limited due to streak artifact from hip arthroplasty.   Minimal Diameter-5.2 mm   Tortuosity-none   Calcification-mild   Review of the MIP images confirms the above findings.   IMPRESSION: Vascular:   1. Vascular findings and measurements pertinent to potential TAVR procedure, as detailed above. 2. Severe thickening and calcification of the aortic valve, compatible with reported clinical history of aortic stenosis. 3. Severe aortoiliac atherosclerosis. Three vessel coronary artery disease.    Nonvascular:   1. Stable hypervascular lesion of the pancreatic head, compatible with indolent endocrine neoplasm. See Dotatate PET-CT dated August 25, 2020 for further evaluation. 2. Subcentimeter indeterminate upper pole left renal lesion is slightly increased in size when compared with prior exam. Recommend renal protocol CT or MRI for further evaluation.     Electronically Signed   By: Yetta Glassman M.D.   On: 04/15/2022 12:21      Impression:   This 88 year old woman has stage D, critical aortic stenosis with NYHA class II symptoms of fatigue and generalized weakness as well as substernal chest discomfort.  I have personally reviewed her 2D echocardiogram, cardiac catheterization, and CTA studies.  Her echo shows a severely calcified aortic valve with restricted leaflet mobility.  The mean gradient was measured at 54 mmHg with a peak gradient of 104 mmHg consistent with critical aortic stenosis.  Left ventricular ejection fraction is normal.  Cardiac catheterization in October 2021 showed mild nonobstructive coronary disease.  The mean gradient across her aortic valve at that time was 48 mmHg.  I agree that aortic valve replacement is indicated in this patient for relief of her worsening symptoms and to prevent left ventricular dysfunction and death.  Given her advanced age and limited mobility due to being wheelchair-bound from severe lower extremity degenerative arthritis I think that transcatheter aortic valve replacement would be the only option for treating her.  Her gated cardiac CTA shows anatomy suitable for TAVR using a SAPIEN 3 valve.  Her abdominal and pelvic CTA shows adequate pelvic vascular anatomy to allow transfemoral insertion.   The patient and her daughter were counseled at length regarding treatment alternatives for management of severe symptomatic aortic stenosis. The risks and benefits of surgical intervention has been discussed in detail. Long-term prognosis with  medical therapy was discussed. Alternative approaches such as conventional surgical aortic valve replacement, transcatheter aortic valve replacement, and palliative medical therapy were compared and contrasted at length. This discussion was placed in the context of the patient's own specific clinical presentation and past medical history. All of their questions have been addressed.    Following the decision to proceed with transcatheter aortic valve replacement, a discussion was held regarding what types of management strategies would be attempted intraoperatively in the event of life-threatening complications, including whether or not the patient would be considered a candidate for the use of cardiopulmonary bypass and/or conversion to open sternotomy for attempted surgical intervention.  I do not think she is a candidate for emergent sternotomy to manage any intraoperative complications or cardiopulmonary bypass. The patient has been advised of a variety of complications that might develop including but not limited to risks of death, stroke, paravalvular leak, aortic dissection or other major vascular complications, aortic annulus rupture, device embolization, cardiac rupture or perforation, mitral regurgitation, acute myocardial infarction, arrhythmia, heart block or bradycardia requiring permanent pacemaker placement, congestive heart failure,  respiratory failure, renal failure, pneumonia, infection, other late complications related to structural valve deterioration or migration, or other complications that might ultimately cause a temporary or permanent loss of functional independence or other long term morbidity. The patient provides full informed consent for the procedure as described and all questions were answered.       Plan:   Transfemoral TAVR using a SAPIEN 3 valve.       Gaye Pollack, MD

## 2022-05-18 ENCOUNTER — Observation Stay (HOSPITAL_COMMUNITY)
Admission: RE | Admit: 2022-05-18 | Discharge: 2022-05-19 | Disposition: A | Discharge status: Home / Self Care | Payer: Medicare Other | Attending: Cardiovascular Disease | Admitting: Cardiovascular Disease

## 2022-05-18 ENCOUNTER — Encounter (HOSPITAL_COMMUNITY): Payer: Self-pay | Admitting: Cardiovascular Disease

## 2022-05-18 ENCOUNTER — Encounter (HOSPITAL_COMMUNITY)
Admission: RE | Disposition: A | Discharge status: Home / Self Care | Payer: Self-pay | Source: Home / Self Care | Attending: Cardiovascular Disease

## 2022-05-18 ENCOUNTER — Other Ambulatory Visit (HOSPITAL_COMMUNITY): Payer: Self-pay | Admitting: *Deleted

## 2022-05-18 ENCOUNTER — Inpatient Hospital Stay (HOSPITAL_COMMUNITY): Payer: Medicare Other

## 2022-05-18 DIAGNOSIS — Z7982 Long term (current) use of aspirin: Secondary | ICD-10-CM | POA: Diagnosis not present

## 2022-05-18 DIAGNOSIS — E785 Hyperlipidemia, unspecified: Secondary | ICD-10-CM | POA: Diagnosis present

## 2022-05-18 DIAGNOSIS — R600 Localized edema: Secondary | ICD-10-CM | POA: Insufficient documentation

## 2022-05-18 DIAGNOSIS — I2510 Atherosclerotic heart disease of native coronary artery without angina pectoris: Secondary | ICD-10-CM | POA: Insufficient documentation

## 2022-05-18 DIAGNOSIS — Z96653 Presence of artificial knee joint, bilateral: Secondary | ICD-10-CM | POA: Diagnosis not present

## 2022-05-18 DIAGNOSIS — Z96642 Presence of left artificial hip joint: Secondary | ICD-10-CM | POA: Diagnosis not present

## 2022-05-18 DIAGNOSIS — E039 Hypothyroidism, unspecified: Secondary | ICD-10-CM | POA: Diagnosis present

## 2022-05-18 DIAGNOSIS — I350 Nonrheumatic aortic (valve) stenosis: Principal | ICD-10-CM

## 2022-05-18 DIAGNOSIS — R112 Nausea with vomiting, unspecified: Secondary | ICD-10-CM | POA: Diagnosis present

## 2022-05-18 DIAGNOSIS — F32A Depression, unspecified: Secondary | ICD-10-CM | POA: Diagnosis present

## 2022-05-18 DIAGNOSIS — Z538 Procedure and treatment not carried out for other reasons: Secondary | ICD-10-CM | POA: Diagnosis not present

## 2022-05-18 DIAGNOSIS — Z87891 Personal history of nicotine dependence: Secondary | ICD-10-CM | POA: Insufficient documentation

## 2022-05-18 HISTORY — DX: Nonrheumatic aortic (valve) stenosis: I35.0

## 2022-05-18 LAB — CBC WITH DIFFERENTIAL/PLATELET
Abs Immature Granulocytes: 0.03 10*3/uL (ref 0.00–0.07)
Basophils Absolute: 0 10*3/uL (ref 0.0–0.1)
Basophils Relative: 0 %
Eosinophils Absolute: 0 10*3/uL (ref 0.0–0.5)
Eosinophils Relative: 0 %
HCT: 38.7 % (ref 36.0–46.0)
Hemoglobin: 12.6 g/dL (ref 12.0–15.0)
Immature Granulocytes: 0 %
Lymphocytes Relative: 6 %
Lymphs Abs: 0.6 10*3/uL — ABNORMAL LOW (ref 0.7–4.0)
MCH: 28 pg (ref 26.0–34.0)
MCHC: 32.6 g/dL (ref 30.0–36.0)
MCV: 86 fL (ref 80.0–100.0)
Monocytes Absolute: 0.6 10*3/uL (ref 0.1–1.0)
Monocytes Relative: 6 %
Neutro Abs: 8.6 10*3/uL — ABNORMAL HIGH (ref 1.7–7.7)
Neutrophils Relative %: 88 %
Platelets: 248 10*3/uL (ref 150–400)
RBC: 4.5 MIL/uL (ref 3.87–5.11)
RDW: 14 % (ref 11.5–15.5)
WBC: 9.9 10*3/uL (ref 4.0–10.5)
nRBC: 0 % (ref 0.0–0.2)

## 2022-05-18 LAB — COMPREHENSIVE METABOLIC PANEL
ALT: 15 U/L (ref 0–44)
AST: 23 U/L (ref 15–41)
Albumin: 3.3 g/dL — ABNORMAL LOW (ref 3.5–5.0)
Alkaline Phosphatase: 68 U/L (ref 38–126)
Anion gap: 8 (ref 5–15)
BUN: 10 mg/dL (ref 8–23)
CO2: 27 mmol/L (ref 22–32)
Calcium: 8.6 mg/dL — ABNORMAL LOW (ref 8.9–10.3)
Chloride: 105 mmol/L (ref 98–111)
Creatinine, Ser: 0.77 mg/dL (ref 0.44–1.00)
GFR, Estimated: >60 mL/min (ref >60)
Glucose, Bld: 118 mg/dL — ABNORMAL HIGH (ref 70–99)
Potassium: 3.6 mmol/L (ref 3.5–5.1)
Sodium: 140 mmol/L (ref 135–145)
Total Bilirubin: 0.9 mg/dL (ref 0.3–1.2)
Total Protein: 6.1 g/dL — ABNORMAL LOW (ref 6.5–8.1)

## 2022-05-18 LAB — BRAIN NATRIURETIC PEPTIDE: B Natriuretic Peptide: 415.3 pg/mL — ABNORMAL HIGH (ref 0.0–100.0)

## 2022-05-18 LAB — PREPARE RBC (CROSSMATCH)

## 2022-05-18 SURGERY — IMPLANTATION, AORTIC VALVE, TRANSCATHETER, FEMORAL APPROACH
Anesthesia: Monitor Anesthesia Care | Laterality: Right

## 2022-05-18 MED ORDER — PAROXETINE HCL 20 MG PO TABS
20.0000 mg | ORAL_TABLET | Freq: Every day | ORAL | Status: DC
  Administered 2022-05-18 – 2022-05-19 (×2): 20 mg via ORAL
  Filled 2022-05-18 (×2): qty 1

## 2022-05-18 MED ORDER — LACTATED RINGERS IV SOLN: INTRAVENOUS | Status: DC

## 2022-05-18 MED ORDER — CHLORHEXIDINE GLUCONATE 4 % EX LIQD
30.0000 mL | CUTANEOUS | Status: DC
  Filled 2022-05-18: qty 30

## 2022-05-18 MED ORDER — SODIUM CHLORIDE 0.9 % IV SOLN: INTRAVENOUS | Status: DC

## 2022-05-18 MED ORDER — SODIUM CHLORIDE 0.9 % IV SOLN: 250.0000 mL | INTRAVENOUS | Status: DC | PRN

## 2022-05-18 MED ORDER — ASPIRIN 81 MG PO TBEC
81.0000 mg | DELAYED_RELEASE_TABLET | Freq: Every day | ORAL | Status: DC
  Administered 2022-05-18 – 2022-05-19 (×2): 81 mg via ORAL
  Filled 2022-05-18 (×2): qty 1

## 2022-05-18 MED ORDER — CHLORHEXIDINE GLUCONATE 0.12 % MT SOLN
15.0000 mL | Freq: Once | OROMUCOSAL | Status: DC
  Filled 2022-05-18: qty 15

## 2022-05-18 MED ORDER — ENOXAPARIN SODIUM 40 MG/0.4ML IJ SOSY
40.0000 mg | PREFILLED_SYRINGE | Freq: Every day | INTRAMUSCULAR | Status: DC
  Administered 2022-05-19: 40 mg via SUBCUTANEOUS
  Filled 2022-05-18: qty 0.4

## 2022-05-18 MED ORDER — CHLORHEXIDINE GLUCONATE 4 % EX LIQD: 60.0000 mL | Freq: Once | CUTANEOUS | Status: DC

## 2022-05-18 MED ORDER — CHLORHEXIDINE GLUCONATE 0.12 % MT SOLN: 15.0000 mL | OROMUCOSAL | Status: AC

## 2022-05-18 MED ORDER — ONDANSETRON HCL 4 MG/2ML IJ SOLN: 4.0000 mg | Freq: Four times a day (QID) | INTRAMUSCULAR | Status: DC | PRN

## 2022-05-18 MED ORDER — LEVOTHYROXINE SODIUM 125 MCG PO TABS
125.0000 ug | ORAL_TABLET | Freq: Every day | ORAL | Status: DC
  Administered 2022-05-19: 125 ug via ORAL
  Filled 2022-05-18: qty 1

## 2022-05-18 MED ORDER — SODIUM CHLORIDE 0.9% FLUSH: 3.0000 mL | INTRAVENOUS | Status: DC | PRN

## 2022-05-18 MED ORDER — CHLORHEXIDINE GLUCONATE 0.12 % MT SOLN
OROMUCOSAL | Status: AC
  Administered 2022-05-18: 15 mL via OROMUCOSAL
  Filled 2022-05-18: qty 15

## 2022-05-18 MED ORDER — SODIUM CHLORIDE 0.9% FLUSH
3.0000 mL | Freq: Two times a day (BID) | INTRAVENOUS | Status: DC
  Administered 2022-05-18 – 2022-05-19 (×2): 3 mL via INTRAVENOUS

## 2022-05-18 MED ORDER — ACETAMINOPHEN 325 MG PO TABS: 650.0000 mg | ORAL_TABLET | ORAL | Status: DC | PRN

## 2022-05-18 MED ORDER — OXYBUTYNIN CHLORIDE ER 5 MG PO TB24
15.0000 mg | ORAL_TABLET | Freq: Every day | ORAL | Status: DC
  Administered 2022-05-18 – 2022-05-19 (×2): 15 mg via ORAL
  Filled 2022-05-18 (×3): qty 1

## 2022-05-18 NOTE — Anesthesia Preprocedure Evaluation (Addendum)
Anesthesia Evaluation  Patient identified by MRN, date of birth, ID band Patient awake    Reviewed: Allergy & Precautions, NPO status , Patient's Chart, lab work & pertinent test results  Airway Mallampati: III  TM Distance: >3 FB Neck ROM: Limited    Dental  (+) Edentulous Upper, Edentulous Lower   Pulmonary former smoker,  Chronic bronchitis   Pulmonary exam normal breath sounds clear to auscultation       Cardiovascular Exercise Tolerance: Poor hypertension, + CAD and + Past MI  + Valvular Problems/Murmurs AS  Rhythm:Regular Rate:Normal + Systolic murmurs Echo 02/2022 1. Dynamic LVOT gradient in setting of severe symmetric LVH and hyperdynamic LV function. Resting gradient 35 mmHg, Valsalva not performed.  Left ventricular ejection fraction, by estimation, is 65 to 70%. The left ventricle has normal function. Left ventricular endocardial border not optimally defined to evaluate regional wall motion. There is severe left ventricular hypertrophy. Left ventricular diastolic parameters are consistent with Grade I diastolic dysfunction (impaired relaxation). Elevated left atrial pressure.  2. Right ventricular systolic function is normal. The right ventricular size is normal. Tricuspid regurgitation signal is inadequate for assessing PA pressure.  3. Left atrial size was mild to moderately dilated.  4. The mitral valve is normal in structure. No evidence of mitral valve regurgitation. Mild mitral stenosis.  5. Severe aortic stenosis by mean gradient. Unable to calculate AVA due to significant pulse wave Doppler aliasing and inability to measure LVOT vmax and VTI. Max mean gradient is with Pedhoff probe measuring 68 mmHg. The aortic valve has an indeterminant number of cusps. There is moderate calcification of the aortic valve. There is moderate thickening of the aortic valve. Aortic valve regurgitation is trivial. Severe aortic valve  stenosis.  6. The inferior vena cava is normal in size with greater than 50% respiratory variability, suggesting right atrial pressure of 3 mmHg.     Pre-op eval per Cardiology Mayford Knife) Elevated Troponins ECG: SR, rate 80 ECHO: - Moderate LVH with LVEF 65-70% and grade 1 diastolic dysfunction.Mild left atrial enlargement. Moderate mitral annular calcification with trivial mitral regurgitation. Moderate to severe calcific aortic stenosis as outlined above. Mild tricuspid regurgitation with PASP 50 mmHg. Cath:02/14/2017 Conclusion: 1. Mild, nonobstructive coronary artery disease with very large, tortuous coronary arteries. No culprit lesion identified for the patient's elevated troponin. 2. Calcified aortic valve. Aortic valve was not crossed due to significant tortuosity of the right brachiocephalic and subclavian arteries as well as known aortic stenosis.    Neuro/Psych PSYCHIATRIC DISORDERS Anxiety Depression negative neurological ROS     GI/Hepatic negative GI ROS, Neg liver ROS,   Endo/Other  Hypothyroidism   Renal/GU negative Renal ROS     Musculoskeletal  (+) Arthritis ,   Abdominal   Peds  Hematology negative hematology ROS (+)   Anesthesia Other Findings Obese, BMI: 35  Reproductive/Obstetrics negative OB ROS                            Anesthesia Physical  Anesthesia Plan  ASA: 4  Anesthesia Plan: General   Post-op Pain Management: Ofirmev IV (intra-op)*   Induction: Intravenous  PONV Risk Score and Plan: 2 and Ondansetron, TIVA, Treatment may vary due to age or medical condition and Dexamethasone  Airway Management Planned: Oral ETT  Additional Equipment:   Intra-op Plan:   Post-operative Plan: Possible Post-op intubation/ventilation  Informed Consent: I have reviewed the patients History and Physical, chart, labs and discussed the procedure  including the risks, benefits and alternatives for the proposed anesthesia with the  patient or authorized representative who has indicated his/her understanding and acceptance.     Dental advisory given  Plan Discussed with: CRNA  Anesthesia Plan Comments: (Pt began vomiting in preop just prior to rolling to EP. She vomited several times prior to my getting to see her again. She denied any chest discomfort or other anginal equivalent. I asked to have her placed on the monitor while I spoke with Drs. Bartle and mcalhany stating I would plan on doing a GA as long as she settled out. I walked back to preop and she was coughing like she had aspirated and then had another large emesis. I went and spoke to  [facility] again and said I did not feel comfortable taking her back at this time. She continued to cough. SpO2 85% on RA.)   Anesthesia Quick Evaluation

## 2022-05-18 NOTE — H&P (Addendum)
[facility]   MULTIDISCIPLINARY HEART VALVE TEAM   History & Physical    Patient ID: [name] MRN: [id number], DOB/AGE: March 10, 1933   Admit date: 05/18/2022   Primary Physician: Assunta Found, MD Primary Cardiologist: Nona Dell, MD  Patient Profile    [name] is a 88 y.o. female with a history of arthritis, CAD, chronic bronchitis, anxiety/depression, HLD, hypothyroidism, wheelchair bound and severe aortic stenosis who presented to [facility] for planned TAVR; however, she developed acute n/v in short stay and it was decided to abort her case and admit her for observation.   Past Medical History   Past Medical History:  Diagnosis Date   Anxiety    Arthritis    CAD (coronary artery disease)    Mild at cardiac catheterization 2018   Chronic bronchitis    Constipation    Depression    Hyperlipidemia    Hypothyroidism    Severe aortic stenosis     Past Surgical History:  Procedure Laterality Date   BUNIONECTOMY Left 1975   CARDIAC CATHETERIZATION  07/04/2020   CHOLECYSTECTOMY N/A 02/15/2017   Procedure: LAPAROSCOPIC CHOLECYSTECTOMY;  Surgeon: Axel Filler, MD;  Location: [facility] OR;  Service: General;  Laterality: N/A;   CORONARY ANGIOGRAPHY N/A 02/14/2017   Procedure: Coronary Angiography;  Surgeon: Yvonne Kendall, MD;  Location: [facility] INVASIVE CV LAB;  Service: Cardiovascular;  Laterality: N/A;   EYE SURGERY  2009   bilateral   HEMATOMA EVACUATION  06/04/2011   Procedure: EVACUATION HEMATOMA;  Surgeon: Fuller Canada, MD;  Location: [facility] ORS;  Service: Orthopedics;  Laterality: Right;  evacuation of seroma   JOINT REPLACEMENT  05/2010   left total knee Dr. Romeo Apple   PATELLAR TENDON REPAIR  06/04/2011   Procedure: PATELLA TENDON REPAIR;  Surgeon: Fuller Canada, MD;  Location: [facility] ORS;  Service: Orthopedics;  Laterality: Right;   PATELLAR TENDON REPAIR  08/30/2011   Procedure: PATELLA TENDON REPAIR;  Surgeon: Fuller Canada, MD;   Location: [facility] ORS;  Service: Orthopedics;  Laterality: Right;  Allograft Reconstrucation Right Patella Tendon   RIGHT/LEFT HEART CATH AND CORONARY ANGIOGRAPHY N/A 07/04/2020   Procedure: RIGHT/LEFT HEART CATH AND CORONARY ANGIOGRAPHY;  Surgeon: Kathleene Hazel, MD;  Location: [facility] INVASIVE CV LAB;  Service: Cardiovascular;  Laterality: N/A;   TOTAL HIP ARTHROPLASTY Left 1998   TOTAL KNEE ARTHROPLASTY  05/03/2011   Procedure: TOTAL KNEE ARTHROPLASTY;  Surgeon: Fuller Canada, MD;  Location: [facility] ORS;  Service: Orthopedics;  Laterality: Right;  With DePuy   TOTAL THYROIDECTOMY  11/2009   Dr. Suszanne Conners @ [facility]   TUBAL LIGATION       Allergies  No Known Allergies  History of Present Illness    The structural heart team initially evaluated her on 08/27/2020 for consideration of TAVR. L/RHC in 06/2020 showed mild non-obstructive CAD. At that time she looked very frail and it was not clear that she was having significant symptoms and it was decided to hold off on TAVR. We decided to continue following her aortic stenosis.  Her most recent echocardiogram on 03/11/2022 showed an increase in the mean gradient to 68 mmHg with a peak gradient of 104 mmHg.  Left ventricular ejection fraction was 65 to 70% with severe LVH. She reported having recent episodes of substernal chest discomfort in the center of her chest that last about 30 minutes as well as progressive fatigue and generalized weakness. She reported living alone and taking care of all of her own ADLs.   The patient  was evaluated by the multidisciplinary valve team and felt to have severe, symptomatic aortic stenosis and to be a suitable candidate for TAVR, which was set up for 05/18/22.   She arrived today for her planned procedure. However, she developed acute nausea and vomiting while preparing for surgery in short stay. There was some concern for aspiration. She denies feeling sick prior to this episode. No fever or chills. No recent chest pain or  new dyspnea. No Le edema, orthopnea or PND. She is disappointed that we have to cancel her surgery but understands it is the safest option.   Home Medications    Prior to Admission medications   Medication Sig Start Date End Date Taking? Authorizing Provider  acetaminophen (TYLENOL) 650 MG CR tablet Take 650 mg by mouth every 8 (eight) hours as needed for pain.   Yes [provider]  aspirin EC 81 MG tablet Take 81 mg by mouth daily.   Yes [provider]  Cholecalciferol (D3 HIGH POTENCY) 50 MCG (2000 UT) CAPS Take 2,000 Units by mouth daily.   Yes [provider]  ibuprofen (ADVIL,MOTRIN) 200 MG tablet Take 600 mg by mouth every 8 (eight) hours as needed for moderate pain (shoulder pain).   Yes [provider]  levothyroxine (SYNTHROID) 125 MCG tablet Take 125 mcg by mouth daily before breakfast.   Yes [provider]  mineral oil-hydrophilic petrolatum (AQUAPHOR) ointment Apply 1 Application topically as needed for irritation.   Yes [provider]  neomycin-bacitracin-polymyxin (NEOSPORIN) OINT Apply 1 Application topically as needed for wound care.   Yes [provider]  Nystatin POWD Apply 1 Application topically as needed (yeast).   Yes [provider]  oxybutynin (DITROPAN XL) 15 MG 24 hr tablet Take 15 mg by mouth daily.   Yes [provider]  PARoxetine (PAXIL) 20 MG tablet Take 20 mg by mouth daily.   Yes [provider]  nitroGLYCERIN (NITROSTAT) 0.4 MG SL tablet Place 1 tablet (0.4 mg total) under the tongue every 5 (five) minutes as needed for chest pain. 03/09/22   Ellsworth Lennox, PA-C    Family History    Family History  Problem Relation Age of Onset   Colon cancer Mother    Blindness Sister    Anesthesia problems Neg Hx    Hypotension Neg Hx    Malignant hyperthermia Neg Hx    Pseudochol deficiency Neg Hx    She indicated that her mother is deceased. She indicated that her  father is deceased. She indicated that her sister is alive. She indicated that the status of her neg hx is unknown.   Social History    Social History   Socioeconomic History   Marital status: Widowed    Spouse name: Not on file   Number of children: 2   Years of education: Not on file   Highest education level: Not on file  Occupational History   Occupation: Retired-Office work  Tobacco Use   Smoking status: Former    Types: Cigarettes    Quit date: 09/27/1994    Years since quitting: 27.6   Smokeless tobacco: Never  Vaping Use   Vaping Use: Never used  Substance and Sexual Activity   Alcohol use: No   Drug use: No   Sexual activity: Not Currently  Other Topics Concern   Not on file  Social History Narrative   ** Merged History Encounter **       Social Determinants of Health  Financial Resource Strain: Not on file  Food Insecurity: Not on file  Transportation Needs: Not on file  Physical Activity: Not on file  Stress: Not on file  Social Connections: Not on file  Intimate Partner Violence: Not on file     Review of Systems    General:  No chills, fever, night sweats or weight changes.  Cardiovascular:  No chest pain, dyspnea on exertion, edema, orthopnea, palpitations, paroxysmal nocturnal dyspnea. Dermatological: No rash, lesions/masses Respiratory: No cough, dyspnea Urologic: No hematuria, dysuria Abdominal:   No nausea, vomiting, diarrhea, bright red blood per rectum, melena, or hematemesis Neurologic:  No visual changes, wkns, changes in mental status. All other systems reviewed and are otherwise negative except as noted above.  Physical Exam    Blood pressure (!) 170/64, pulse 62, temperature 98.7 F (37.1 C), temperature source Oral, resp. rate 17, height 5\' 9"  (1.753 m), weight 90.7 kg, SpO2 98 %.  General: Pleasant, NAD Psych: Normal affect. Neuro: Alert and oriented X 3. Moves all extremities spontaneously. HEENT: Normal  Neck: Supple without  bruits or JVD. Lungs:  Resp regular and unlabored, CTA. Heart: RRR no s3, s4, or murmurs. Abdomen: Soft, non-tender, non-distended, BS + x 4.  Extremities: No clubbing, cyanosis or edema. DP/PT 2+, Radials 2+ and equal bilaterally.  Labs    Cardiac Enzymes No results for input(s): "TROPONINIHS" in the last 720 hours.    Lab Results  Component Value Date   WBC 7.8 05/14/2022   HGB 12.5 05/14/2022   HCT 39.1 05/14/2022   MCV 89.1 05/14/2022   PLT 240 05/14/2022    Recent Labs  Lab 05/14/22 0930  NA 141  K 3.6  CL 105  CO2 28  BUN 14  CREATININE 0.68  CALCIUM 9.0  PROT 5.9*  BILITOT 0.7  ALKPHOS 64  ALT 14  AST 23  GLUCOSE 87   No results found for: "CHOL", "HDL", "LDLCALC", "TRIG" Lab Results  Component Value Date   DDIMER 4.14 (H) 02/12/2017   BNP    Component Value Date/Time   BNP 137.0 (H) 02/12/2017 0551    ProBNP    Component Value Date/Time   PROBNP 192.4 02/05/2014 1449   Radiology Studies    DG Chest 2 View  Result Date: 05/16/2022 CLINICAL DATA:  Preop assessment for TAVR, weakness. EXAM: CHEST - 2 VIEW COMPARISON:  02/12/2017. FINDINGS: Heart is enlarged the mediastinal contour is within normal limits. Lung volumes are low with mild atelectasis at the lung bases. No effusion or pneumothorax. Degenerative changes are noted in the thoracic spine and glenohumeral joints bilaterally. IMPRESSION: 1. Mild atelectasis at the lung bases. 2. Cardiomegaly. Electronically Signed   By: 02/14/2017 M.D.   On: 05/16/2022 03:45    ECG & Cardiac Imaging    No ecg this admission - personally reviewed.  Assessment & Plan    Nausea and vomiting: will admit overnight for observation. Will give her some food and treat with PRN Zofran. CXR to rule out aspiration. Check labs.   Severe AS: TAVR cancelled given N/V. We will plan to r/s TAVR for next Tuesday.   HTN: BP elevated. Not on any antihypertensives as an outpatient. Will continue to monitor.    Signed, Tuesday, PA-C 05/18/2022, 2:13 PM  I have personally seen and examined this patient. I agree with the assessment and plan as outlined above.  Pt here today for outpatient TAVR but in the pre-op area she developed nausea with emesis x 3. Case  discussed with the Anesthesia team and they did not feel comfortable with conscious sedation for the procedure. The TAVR procedure was cancelled. She is admitted for observation. Chest x-ray today and basic labs.   Verne Carrow 05/18/2022 3:35 PM

## 2022-05-18 NOTE — Anesthesia Procedure Notes (Signed)
Arterial Line Insertion Start/End8/22/2023 11:35 AM, 05/18/2022 11:45 AM Performed by: Waynard Edwards, CRNA, CRNA  Patient location: Pre-op. Preanesthetic checklist: patient identified, IV checked, site marked, risks and benefits discussed, surgical consent, monitors and equipment checked, pre-op evaluation, timeout performed and anesthesia consent Lidocaine 1% used for infiltration Right, radial was placed Catheter size: 20 G Hand hygiene performed , maximum sterile barriers used  and Seldinger technique used Allen's test indicative of satisfactory collateral circulation Attempts: 1 Procedure performed without using ultrasound guided technique. Following insertion, dressing applied and Biopatch. Post procedure assessment: normal and unchanged  Patient tolerated the procedure well with no immediate complications.

## 2022-05-18 NOTE — Interval H&P Note (Signed)
History and Physical Interval Note:  05/18/2022 10:00 AM  [name]  has presented today for surgery, with the diagnosis of Severe Aortic Stenosis.  The various methods of treatment have been discussed with the patient and family. After consideration of risks, benefits and other options for treatment, the patient has consented to  Procedure(s): Transcatheter Aortic Valve Replacement, Transfemoral (Right) INTRAOPERATIVE TRANSTHORACIC ECHOCARDIOGRAM (N/A) as a surgical intervention.  [name] history has been reviewed, patient examined, no change in status, stable for surgery.  I have reviewed the patient's chart and labs.  Questions were answered to the patient's satisfaction.     [name]

## 2022-05-18 NOTE — Progress Notes (Signed)
Dr. Clifton James stated to remove arterial line at this time. Anesthesia made aware.

## 2022-05-18 NOTE — Progress Notes (Signed)
Report called to Vernona Rieger RN on 6E. Patient transferred to floor in safe and stable condition.

## 2022-05-19 ENCOUNTER — Other Ambulatory Visit: Payer: Self-pay

## 2022-05-19 ENCOUNTER — Observation Stay (HOSPITAL_COMMUNITY): Payer: Medicare Other

## 2022-05-19 DIAGNOSIS — Z538 Procedure and treatment not carried out for other reasons: Secondary | ICD-10-CM | POA: Diagnosis not present

## 2022-05-19 DIAGNOSIS — Z7982 Long term (current) use of aspirin: Secondary | ICD-10-CM | POA: Diagnosis not present

## 2022-05-19 DIAGNOSIS — I2510 Atherosclerotic heart disease of native coronary artery without angina pectoris: Secondary | ICD-10-CM | POA: Diagnosis not present

## 2022-05-19 DIAGNOSIS — E039 Hypothyroidism, unspecified: Secondary | ICD-10-CM | POA: Diagnosis not present

## 2022-05-19 DIAGNOSIS — I350 Nonrheumatic aortic (valve) stenosis: Secondary | ICD-10-CM | POA: Diagnosis not present

## 2022-05-19 DIAGNOSIS — R600 Localized edema: Secondary | ICD-10-CM | POA: Diagnosis not present

## 2022-05-19 DIAGNOSIS — K449 Diaphragmatic hernia without obstruction or gangrene: Secondary | ICD-10-CM | POA: Diagnosis not present

## 2022-05-19 DIAGNOSIS — Z96642 Presence of left artificial hip joint: Secondary | ICD-10-CM | POA: Diagnosis not present

## 2022-05-19 DIAGNOSIS — R112 Nausea with vomiting, unspecified: Secondary | ICD-10-CM | POA: Diagnosis not present

## 2022-05-19 DIAGNOSIS — Z96653 Presence of artificial knee joint, bilateral: Secondary | ICD-10-CM | POA: Diagnosis not present

## 2022-05-19 DIAGNOSIS — Z87891 Personal history of nicotine dependence: Secondary | ICD-10-CM | POA: Diagnosis not present

## 2022-05-19 LAB — BASIC METABOLIC PANEL
Anion gap: 11 (ref 5–15)
BUN: 17 mg/dL (ref 8–23)
CO2: 21 mmol/L — ABNORMAL LOW (ref 22–32)
Calcium: 8.5 mg/dL — ABNORMAL LOW (ref 8.9–10.3)
Chloride: 108 mmol/L (ref 98–111)
Creatinine, Ser: 0.81 mg/dL (ref 0.44–1.00)
GFR, Estimated: >60 mL/min (ref >60)
Glucose, Bld: 98 mg/dL (ref 70–99)
Potassium: 3.6 mmol/L (ref 3.5–5.1)
Sodium: 140 mmol/L (ref 135–145)

## 2022-05-19 NOTE — Discharge Summary (Addendum)
[facility]   MULTIDISCIPLINARY HEART VALVE TEAM  Discharge Summary    Patient ID: [name] MRN: [id number]; DOB: 1932-11-11  Admit date: 05/18/2022 Discharge date: 05/19/2022  Primary Care Provider: Assunta Found, MD  Primary Cardiologist: Nona Dell, MD   Discharge Diagnoses    Principal Problem:   Nausea and vomiting Active Problems:   Hypothyroidism   Hyperlipidemia   Depression   Severe aortic stenosis   Allergies No Known Allergies  Diagnostic Studies/Procedures    none  History of Present Illness     [name] is a 88 y.o. female with a history of arthritis, CAD, chronic bronchitis, anxiety/depression, HLD, hypothyroidism, wheelchair bound and severe aortic stenosis who presented to [facility] for planned TAVR; however, she developed acute n/v in short stay and it was decided to abort her case and admit her for observation.   The structural heart team initially evaluated her on 08/27/2020 for consideration of TAVR. L/RHC in 06/2020 showed mild non-obstructive CAD. At that time she looked very frail and it was not clear that she was having significant symptoms and it was decided to hold off on TAVR. We decided to continue following her aortic stenosis.  Her most recent echocardiogram on 03/11/2022 showed an increase in the mean gradient to 68 mmHg with a peak gradient of 104 mmHg.  Left ventricular ejection fraction was 65 to 70% with severe LVH. She reported having recent episodes of substernal chest discomfort in the center of her chest that last about 30 minutes as well as progressive fatigue and generalized weakness. She reported living alone and taking care of all of her own ADLs.    The patient was evaluated by the multidisciplinary valve team and felt to have severe, symptomatic aortic stenosis and to be a suitable candidate for TAVR, which was set up for 05/18/22. She developed acute nausea and vomiting while preparing for surgery in  short stay with concern for aspiration. Surgery was cancelled and she was admitted for overnight observation.   Chen Course     Consultants: none   Nausea and vomiting: admitted overnight for observation. Nausea resolved. She is eating and drinking well. Labs were stable. CXR non acute. Plan for discharge home.   LE edema: she has some volume overload. She self discontinued her lasix. Will plan to restart this now at 20mg  daily.    Severe AS: rescheduled for TAVR for next Tuesday. Instructions for surgery given. No PAT planned.    HTN: BP well controlled today. No changes made _____________  Discharge Vitals Blood pressure (!) 123/57, pulse 67, temperature 98.8 F (37.1 C), temperature source Oral, resp. rate 18, height 5\' 9"  (1.753 m), weight 90.7 kg, SpO2 94 %.  Filed Weights   05/18/22 0956  Weight: 90.7 kg    GEN: Well nourished, well developed, in no acute distress HEENT: normal Neck: no JVD or masses Cardiac: RRR; Harsh 3/6 SEM. No rubs, or gallops.  1+ bilateral LE edema with pedal edema.  Respiratory:  clear to auscultation bilaterally, normal work of breathing GI: soft, nontender, nondistended, + BS MS: no deformity or atrophy Skin: warm and dry, no rash Neuro:  Alert and Oriented x 3, Strength and sensation are intact Psych: euthymic mood, full affect   Labs & Radiologic Studies    CBC Recent Labs    05/18/22 1637  WBC 9.9  NEUTROABS 8.6*  HGB 12.6  HCT 38.7  MCV 86.0  PLT 248   Basic Metabolic Panel Recent  Labs    05/18/22 1637 05/19/22 0412  NA 140 140  K 3.6 3.6  CL 105 108  CO2 27 21*  GLUCOSE 118* 98  BUN 10 17  CREATININE 0.77 0.81  CALCIUM 8.6* 8.5*   Liver Function Tests Recent Labs    05/18/22 1637  AST 23  ALT 15  ALKPHOS 68  BILITOT 0.9  PROT 6.1*  ALBUMIN 3.3*   No results for input(s): "LIPASE", "AMYLASE" in the last 72 hours. Cardiac Enzymes No results for input(s): "CKTOTAL", "CKMB", "CKMBINDEX", "TROPONINI" in  the last 72 hours. BNP Invalid input(s): "POCBNP" D-Dimer No results for input(s): "DDIMER" in the last 72 hours. Hemoglobin A1C No results for input(s): "HGBA1C" in the last 72 hours. Fasting Lipid Panel No results for input(s): "CHOL", "HDL", "LDLCALC", "TRIG", "CHOLHDL", "LDLDIRECT" in the last 72 hours. Thyroid Function Tests No results for input(s): "TSH", "T4TOTAL", "T3FREE", "THYROIDAB" in the last 72 hours.  Invalid input(s): "FREET3" _____________  DG Chest 2 View  Result Date: 05/19/2022 CLINICAL DATA:  [id number].  Encounter for aspiration. EXAM: CHEST - 2 VIEW COMPARISON:  Portable chest 05/14/2022. FINDINGS: The patient is rotated to the left today. There is mild cardiomegaly without evidence of CHF. There was tortuous and calcified with stable mediastinum. The lungs are generally clear apart from linear scarring or atelectasis in the lateral left base. No pleural effusion is seen. A moderate-sized hiatal hernia is again noted in the inferior mediastinum. Osteopenia and mild thoracic dextroscoliosis. IMPRESSION: No evidence of acute chest process. Stable chest with cardiomegaly chronic changes. Electronically Signed   By: Almira Bar M.D.   On: 05/19/2022 07:01   DG Chest 2 View  Result Date: 05/16/2022 CLINICAL DATA:  Preop assessment for TAVR, weakness. EXAM: CHEST - 2 VIEW COMPARISON:  02/12/2017. FINDINGS: Heart is enlarged the mediastinal contour is within normal limits. Lung volumes are low with mild atelectasis at the lung bases. No effusion or pneumothorax. Degenerative changes are noted in the thoracic spine and glenohumeral joints bilaterally. IMPRESSION: 1. Mild atelectasis at the lung bases. 2. Cardiomegaly. Electronically Signed   By: Thornell Sartorius M.D.   On: 05/16/2022 03:45    Disposition   Pt is being discharged home today in good condition.  Follow-up Plans & Appointments     Follow-up Information     [name] Follow up.   Why: Please  return to [facility] next week for surgery as instructed in your letter. Contact information: [address] [place] [address] [phone]                 Discharge Medications   Allergies as of 05/19/2022   No Known Allergies      Medication List     TAKE these medications    acetaminophen 650 MG CR tablet Commonly known as: TYLENOL Take 650 mg by mouth every 8 (eight) hours as needed for pain.   aspirin EC 81 MG tablet Take 81 mg by mouth daily.   D3 High Potency 50 MCG (2000 UT) Caps Generic drug: Cholecalciferol Take 2,000 Units by mouth daily.   ibuprofen 200 MG tablet Commonly known as: ADVIL Take 600 mg by mouth every 8 (eight) hours as needed for moderate pain (shoulder pain).   levothyroxine 125 MCG tablet Commonly known as: SYNTHROID Take 125 mcg by mouth daily before breakfast.   mineral oil-hydrophilic petrolatum ointment Apply 1 Application topically as needed for irritation.   neomycin-bacitracin-polymyxin Oint Commonly known as:  NEOSPORIN Apply 1 Application topically as needed for wound care.   nitroGLYCERIN 0.4 MG SL tablet Commonly known as: NITROSTAT Place 1 tablet (0.4 mg total) under the tongue every 5 (five) minutes as needed for chest pain.   Nystatin Powd Apply 1 Application topically as needed (yeast).   oxybutynin 15 MG 24 hr tablet Commonly known as: DITROPAN XL Take 15 mg by mouth daily.   PARoxetine 20 MG tablet Commonly known as: PAXIL Take 20 mg by mouth daily.         Outstanding Labs/Studies   none  Duration of Discharge Encounter   Greater than 30 minutes including physician time.  Byrd Hesselbach, PA-C 05/19/2022, 10:58 AM [phone]  I have personally seen and examined this patient. I agree with the assessment and plan as outlined above.  Doing well. Labs ok. Nausea resolved. D/C home today. TAVR next week.   Verne Carrow,  MD 05/19/2022 7:07 PM

## 2022-05-24 DIAGNOSIS — N281 Cyst of kidney, acquired: Secondary | ICD-10-CM | POA: Diagnosis not present

## 2022-05-24 MED ORDER — HEPARIN 30,000 UNITS/1000 ML (OHS) CELLSAVER SOLUTION
Status: DC
  Filled 2022-05-24 (×2): qty 1000

## 2022-05-24 MED ORDER — MAGNESIUM SULFATE 50 % IJ SOLN
40.0000 meq | INTRAMUSCULAR | Status: DC
  Filled 2022-05-24 (×2): qty 9.85

## 2022-05-24 MED ORDER — CEFAZOLIN SODIUM-DEXTROSE 2-4 GM/100ML-% IV SOLN
2.0000 g | INTRAVENOUS | Status: AC
  Administered 2022-05-25: 2 g via INTRAVENOUS
  Filled 2022-05-24: qty 100

## 2022-05-24 MED ORDER — NOREPINEPHRINE 4 MG/250ML-% IV SOLN
0.0000 ug/min | INTRAVENOUS | Status: AC
  Administered 2022-05-25: 2 ug/min via INTRAVENOUS
  Filled 2022-05-24: qty 250

## 2022-05-24 MED ORDER — DEXMEDETOMIDINE HCL IN NACL 400 MCG/100ML IV SOLN
0.1000 ug/kg/h | INTRAVENOUS | Status: AC
  Administered 2022-05-25: 90.72 ug via INTRAVENOUS
  Administered 2022-05-25: 1 ug/kg/h via INTRAVENOUS
  Filled 2022-05-24 (×2): qty 100

## 2022-05-24 MED ORDER — POTASSIUM CHLORIDE 2 MEQ/ML IV SOLN
80.0000 meq | INTRAVENOUS | Status: DC
  Filled 2022-05-24 (×2): qty 40

## 2022-05-24 NOTE — Anesthesia Preprocedure Evaluation (Addendum)
Anesthesia Evaluation  Patient identified by MRN, date of birth, ID band Patient awake    Reviewed: Allergy & Precautions, NPO status , Patient's Chart, lab work & pertinent test results  Airway Mallampati: II  TM Distance: >3 FB Neck ROM: Full    Dental  (+) Dental Advisory Given, Edentulous Upper, Edentulous Lower   Pulmonary former smoker,    Pulmonary exam normal breath sounds clear to auscultation       Cardiovascular + CAD  + Valvular Problems/Murmurs AS  Rhythm:Regular Rate:Normal + Systolic murmurs Echo 03/11/22   Neuro/Psych PSYCHIATRIC DISORDERS Anxiety Depression negative neurological ROS     GI/Hepatic negative GI ROS, Neg liver ROS,   Endo/Other  Hypothyroidism   Renal/GU negative Renal ROS     Musculoskeletal  (+) Arthritis ,   Abdominal   Peds  Hematology negative hematology ROS (+)   Anesthesia Other Findings   Reproductive/Obstetrics                            Anesthesia Physical Anesthesia Plan  ASA: 4  Anesthesia Plan: MAC   Post-op Pain Management: Tylenol PO (pre-op)*   Induction: Intravenous  PONV Risk Score and Plan: 2 and TIVA, Treatment may vary due to age or medical condition, Dexamethasone and Ondansetron  Airway Management Planned: Natural Airway and Nasal Cannula  Additional Equipment: Arterial line  Intra-op Plan:   Post-operative Plan:   Informed Consent: I have reviewed the patients History and Physical, chart, labs and discussed the procedure including the risks, benefits and alternatives for the proposed anesthesia with the patient or authorized representative who has indicated his/her understanding and acceptance.     Dental advisory given  Plan Discussed with: CRNA  Anesthesia Plan Comments:        Anesthesia Quick Evaluation

## 2022-05-24 NOTE — H&P (Signed)
[address]       [place] [address]             [phone]      Cardiothoracic Surgery Admission History and Physical   PCP is Sharilyn Sites, MD Referring Provider is Gennette Pac Primary Cardiologist is Rozann Lesches, MD   Reason for admission:  critical aortic stenosis   HPI:   The patient is an 88 year old woman with a history of severe degenerative arthritis of the knees with very limited mobility and wheelchair-bound, incontinence, hyperlipidemia, hypothyroidism, anxiety/depression, chronic bronchitis, coronary artery disease, and severe aortic stenosis who I initially evaluated on 08/27/2020 for consideration of TAVR.  At that time she looked very frail and it was not clear that she was having significant symptoms and how much TAVR would help her.  We decided to continue following her aortic stenosis.  Her most recent echocardiogram on 03/11/2022 shows an increase in the mean gradient to 68 mmHg with a peak gradient of 104 mmHg.  Left ventricular ejection fraction is 65 to 70% with severe LVH.  She reports having recent episodes of substernal chest discomfort in the center of her chest that last about 30 minutes.  She has had progressive fatigue and generalized weakness.  She denies any dizziness or syncope.  She has had developed some lower extremity edema. She has no orthopnea.   She remains confined to a wheelchair due to severe lower extremity arthritis and inability to support her weight.  Despite being confined to a wheelchair she lives alone and still manages to do her housework, laundry, and take care of herself.  She is able to transfer from her wheelchair on her own.  She has 2 dogs and 2 cats that she cares for.           Past Medical History:  Diagnosis Date   Anxiety     Aortic stenosis     Arthritis     CAD (coronary artery disease)      Mild at cardiac catheterization 2018   Chronic bronchitis     Constipation     Depression      Hyperlipidemia     Hypothyroidism             Past Surgical History:  Procedure Laterality Date   CARDIAC CATHETERIZATION   07/04/2020   CHOLECYSTECTOMY N/A 02/15/2017    Procedure: LAPAROSCOPIC CHOLECYSTECTOMY;  Surgeon: Ralene Ok, MD;  Location: [facility];  Service: General;  Laterality: N/A;   CORONARY ANGIOGRAPHY N/A 02/14/2017    Procedure: Coronary Angiography;  Surgeon: Nelva Bush, MD;  Location: [facility];  Service: Cardiovascular;  Laterality: N/A;   EYE SURGERY   2009    bilateral   HEMATOMA EVACUATION   06/04/2011    Procedure: EVACUATION HEMATOMA;  Surgeon: Arther Abbott, MD;  Location: [facility];  Service: Orthopedics;  Laterality: Right;  evacuation of seroma   JOINT REPLACEMENT   05/2010    left total knee Dr. Aline Brochure   Left hip replacement @ [facility] '98       Left total knee       PATELLAR TENDON REPAIR   06/04/2011    Procedure: PATELLA TENDON REPAIR;  Surgeon: Arther Abbott, MD;  Location: [facility];  Service: Orthopedics;  Laterality: Right;   PATELLAR TENDON REPAIR   08/30/2011    Procedure: PATELLA TENDON REPAIR;  Surgeon: Arther Abbott, MD;  Location: [facility];  Service: Orthopedics;  Laterality: Right;  Allograft Reconstrucation Right Patella Tendon   REPLACEMENT TOTAL KNEE BILATERAL       RIGHT/LEFT HEART CATH AND CORONARY ANGIOGRAPHY N/A 07/04/2020    Procedure: RIGHT/LEFT HEART CATH AND CORONARY ANGIOGRAPHY;  Surgeon: Kathleene Hazel, MD;  Location: [facility];  Service: Cardiovascular;  Laterality: N/A;   TOTAL HIP ARTHROPLASTY       TOTAL KNEE ARTHROPLASTY   05/03/2011    Procedure: TOTAL KNEE ARTHROPLASTY;  Surgeon: Fuller Canada, MD;  Location: [facility];  Service: Orthopedics;  Laterality: Right;  With DePuy   TOTAL THYROIDECTOMY   March 2011    Dr. Suszanne Conners @ [facility]   TUBAL LIGATION               Family History  Problem Relation Age of Onset   Colon cancer Mother     Blindness Sister     Anesthesia problems Neg Hx      Hypotension Neg Hx     Malignant hyperthermia Neg Hx     Pseudochol deficiency Neg Hx        Social History         Socioeconomic History   Marital status: Widowed      Spouse name: Not on file   Number of children: 4   Years of education: Not on file   Highest education level: Not on file  Occupational History   Occupation: Retired-Office work  Tobacco Use   Smoking status: Former      Types: Cigarettes      Quit date: 09/27/1978      Years since quitting: 43.6   Smokeless tobacco: Never  Vaping Use   Vaping Use: Never used  Substance and Sexual Activity   Alcohol use: No   Drug use: No   Sexual activity: Not Currently  Other Topics Concern   Not on file  Social History Narrative    ** Merged History Encounter **         Social Determinants of Health    Financial Resource Strain: Not on file  Food Insecurity: Not on file  Transportation Needs: Not on file  Physical Activity: Not on file  Stress: Not on file  Social Connections: Not on file  Intimate Partner Violence: Not on file             Prior to Admission medications   Medication Sig Start Date End Date Taking? Authorizing Provider  acetaminophen (TYLENOL) 650 MG CR tablet Take 650 mg by mouth every 8 (eight) hours as needed for pain.     Yes [provider]  aspirin EC 81 MG tablet Take 81 mg by mouth daily.     Yes [provider]  Cholecalciferol (D3 HIGH POTENCY) 50 MCG (2000 UT) CAPS Take 2,000 Units by mouth daily.     Yes [provider]  ibuprofen (ADVIL,MOTRIN) 200 MG tablet Take 600 mg by mouth every 8 (eight) hours as needed for moderate pain (shoulder pain).     Yes [provider]  levothyroxine (SYNTHROID) 125 MCG tablet Take 125 mcg by mouth daily before breakfast.     Yes [provider]  mineral oil-hydrophilic petrolatum (AQUAPHOR) ointment Apply 1 Application topically as needed for irritation.     Yes [provider]   neomycin-bacitracin-polymyxin (NEOSPORIN) OINT Apply 1 Application topically as needed for wound care.     Yes [provider]  nitroGLYCERIN (NITROSTAT) 0.4 MG SL tablet Place 1 tablet (0.4 mg total) under the tongue every  5 (five) minutes as needed for chest pain. 03/09/22   Yes Strader, [place], PA-C  Nystatin POWD Apply 1 Application topically as needed (yeast).     Yes [provider]  oxybutynin (DITROPAN XL) 15 MG 24 hr tablet Take 15 mg by mouth daily.     Yes [provider]  PARoxetine (PAXIL) 20 MG tablet Take 20 mg by mouth daily.     Yes [provider]            Current Outpatient Medications  Medication Sig Dispense Refill   acetaminophen (TYLENOL) 650 MG CR tablet Take 650 mg by mouth every 8 (eight) hours as needed for pain.       aspirin EC 81 MG tablet Take 81 mg by mouth daily.       Cholecalciferol (D3 HIGH POTENCY) 50 MCG (2000 UT) CAPS Take 2,000 Units by mouth daily.       ibuprofen (ADVIL,MOTRIN) 200 MG tablet Take 600 mg by mouth every 8 (eight) hours as needed for moderate pain (shoulder pain).       levothyroxine (SYNTHROID) 125 MCG tablet Take 125 mcg by mouth daily before breakfast.       mineral oil-hydrophilic petrolatum (AQUAPHOR) ointment Apply 1 Application topically as needed for irritation.       neomycin-bacitracin-polymyxin (NEOSPORIN) OINT Apply 1 Application topically as needed for wound care.       nitroGLYCERIN (NITROSTAT) 0.4 MG SL tablet Place 1 tablet (0.4 mg total) under the tongue every 5 (five) minutes as needed for chest pain. 25 tablet 3   Nystatin POWD Apply 1 Application topically as needed (yeast).       oxybutynin (DITROPAN XL) 15 MG 24 hr tablet Take 15 mg by mouth daily.       PARoxetine (PAXIL) 20 MG tablet Take 20 mg by mouth daily.        No current facility-administered medications for this visit.      No Known Allergies       Review of Systems:   no             Cardiac:                        + chest pain with exertion, + chest pain at rest, no SOB with exertion, no resting SOB, no PND, no orthopnea, no palpitations, no arrhythmia, no atrial fibrillation, + LE edema, no dizzy spells, no syncope             Respiratory:                 no shortness of breath, no home oxygen, no productive cough, no dry cough, no bronchitis, no wheezing, no hemoptysis, on asthma, no pain with inspiration or cough, no sleep apnea, no CPAP at night             GI:                               no difficulty swallowing, no reflux, no frequent heartburn, no hiatal hernia, no abdominal pain, no constipation, no diarrhea, no hematochezia, no hematemesis, no melena             GU:                              no dysuria,  +  frequency, + incontinence, no urinary tract infection, no hematuria, no kidney stones, no kidney disease             Vascular:                     no pain suggestive of claudication, no pain in feet, no leg cramps, + varicose veins, no DVT, no non-healing foot ulcer             Neuro:                         no stroke, no TIA's, no seizures, no headaches, no temporary blindness one eye,  no slurred speech, no peripheral neuropathy, no chronic pain, no instability of gait, no memory/cognitive dysfunction             Musculoskeletal:         + arthritis of knees, no joint swelling, no myalgias, + can't support weight to stand or walk, + decreased mobility              Skin:                            no rash, no itching, no skin infections, no pressure sores or ulcerations             Psych:                         no anxiety, no depression, no nervousness, no unusual recent stress             Eyes:                           no blurry vision, no floaters, no recent vision changes, + wears glasses             ENT:                            no hearing loss, no loose or painful teeth, no dentures, edentulous             Hematologic:               + easy bruising, no abnormal bleeding, no  clotting disorder, no frequent epistaxis             Endocrine:                   no diabetes, does not check CBG's at home                            Physical Exam:               BP (!) 175/78   Pulse 68   Resp 20   Ht 5\' 9"  (1.753 m)   Wt 200 lb (90.7 kg)   SpO2 95% Comment: RA  BMI 29.53 kg/m              General:                      In wheelchair but alert and conversant             HEENT:  Unremarkable, NCAT, PERLA, EOMI             Neck:                           no JVD, no bruits, no adenopathy              Chest:                          clear to auscultation, symmetrical breath sounds, no wheezes, no rhonchi              CV:                              RRR, 3/6 systolic murmur RSB             Abdomen:                    soft, non-tender, no masses              Extremities:                 warm, well-perfused, pulses palpable at ankles, mild lower extremity edema             Rectal/GU                   Deferred             Neuro:                         Grossly non-focal and symmetrical throughout             Skin:                            Clean and dry, no rashes, no breakdown   Diagnostic Tests:   ECHOCARDIOGRAM REPORT         Patient Name:   [name] Date of Exam: 03/11/2022  Medical Rec #:  [id number]           Height:       69.5 in  Accession #:    [id number]          Weight:       220.2 lb  Date of Birth:  12-09-1932           BSA:          2.163 m  Patient Age:    5 years            BP:           168/82 mmHg  Patient Gender: F                   HR:           82 bpm.  Exam Location:  [facility]   Procedure: Cardiac Doppler and Color Doppler   Indications:    I35.0 Nonrheumatic aortic (valve) stenosis     History:        Patient has prior history of Echocardiogram examinations,  most                  recent 06/16/2020. CAD and Previous Myocardial Infarction,  Aortic  Valve Disease, Signs/Symptoms:Chest Pain; Risk                   Factors:Hypertension and Former Smoker.     Sonographer:    Estill Bamberg McFatter RDMS, RVT, RDCS  Referring Phys: 25 SAMUEL G MCDOWELL      Sonographer Comments: Global longitudinal strain was attempted.  IMPRESSIONS     1. Dynamic LVOT gradient in setting of severe symmetric LVH and  hyperdynamic LV function. Resting gradient 35 mmHg, Valsalva not  performed. . Left ventricular ejection fraction, by estimation, is 65 to  70%. The left ventricle has normal function. Left  ventricular endocardial border not optimally defined to evaluate regional  wall motion. There is severe left ventricular hypertrophy. Left  ventricular diastolic parameters are consistent with Grade I diastolic  dysfunction (impaired relaxation). Elevated  left atrial pressure.   2. Right ventricular systolic function is normal. The right ventricular  size is normal. Tricuspid regurgitation signal is inadequate for assessing  PA pressure.   3. Left atrial size was mild to moderately dilated.   4. The mitral valve is normal in structure. No evidence of mitral valve  regurgitation. Mild mitral stenosis.   5. Severe aortic stenosis by mean gradient. Unable to calculate AVA due  to significant pulse wave Doppler aliasing and inability to measure LVOT  vmax and VTI. Max mean gradient is with Pedhoff probe measuring 68 mmHg. .  The aortic valve has an  indeterminant number of cusps. There is moderate calcification of the  aortic valve. There is moderate thickening of the aortic valve. Aortic  valve regurgitation is trivial. Severe aortic valve stenosis.   6. The inferior vena cava is normal in size with greater than 50%  respiratory variability, suggesting right atrial pressure of 3 mmHg.   Comparison(s): Echocardiogram done 06/16/20 showed an EF of 65-70% with  severe AS and an AV Mean Grad of 42 mmHg.   FINDINGS   Left Ventricle: Dynamic LVOT gradient in setting of severe symmetric LVH  and  hyperdynamic LV function. Resting gradient 35 mmHg, Valsalva not  performed. Left ventricular ejection fraction, by estimation, is 65 to  70%. The left ventricle has normal  function. Left ventricular endocardial border not optimally defined to  evaluate regional wall motion. Global longitudinal strain performed but  not reported based on interpreter judgement due to suboptimal tracking.  The left ventricular internal cavity  size was normal in size. There is severe left ventricular hypertrophy.  Left ventricular diastolic parameters are consistent with Grade I  diastolic dysfunction (impaired relaxation). Elevated left atrial  pressure.   Right Ventricle: The right ventricular size is normal. Right vetricular  wall thickness was not well visualized. Right ventricular systolic  function is normal. Tricuspid regurgitation signal is inadequate for  assessing PA pressure.   Left Atrium: Left atrial size was mild to moderately dilated.   Right Atrium: Right atrial size was normal in size.   Pericardium: There is no evidence of pericardial effusion.   Mitral Valve: The mitral valve is normal in structure. There is mild  thickening of the mitral valve leaflet(s). There is mild calcification of  the mitral valve leaflet(s). Mild to moderate mitral annular  calcification. No evidence of mitral valve  regurgitation. Mild mitral valve stenosis. The mean mitral valve gradient  is 3.0 mmHg.   Tricuspid Valve: The tricuspid valve is not well visualized. Tricuspid  valve regurgitation is mild . No evidence of tricuspid stenosis.   Aortic Valve: Severe  aortic stenosis by mean gradient. Unable to calculate  AVA due to significant pulse wave Doppler aliasing and inability to  measure LVOT vmax and VTI. Max mean gradient is with Pedhoff probe  measuring 68 mmHg. The aortic valve has an  indeterminant number of cusps. There is moderate calcification of the  aortic valve. There is moderate  thickening of the aortic valve. There is  moderate aortic valve annular calcification. Aortic valve regurgitation is  trivial. Severe aortic stenosis is  present. Aortic valve mean gradient measures 54.0 mmHg. Aortic valve peak  gradient measures 104.3 mmHg.   Pulmonic Valve: The pulmonic valve was not well visualized. Pulmonic valve  regurgitation is not visualized. No evidence of pulmonic stenosis.   Aorta: The aortic root is normal in size and structure.   Venous: The inferior vena cava is normal in size with greater than 50%  respiratory variability, suggesting right atrial pressure of 3 mmHg.   IAS/Shunts: No atrial level shunt detected by color flow Doppler.      LEFT VENTRICLE  PLAX 2D  LVIDd:         1.90 cm     Diastology  LVIDs:         1.37 cm     LV e' medial:    5.00 cm/s  LV PW:         1.72 cm     LV E/e' medial:  19.5  LV IVS:        1.70 cm     LV e' lateral:   5.22 cm/s  LVOT diam:     2.00 cm     LV E/e' lateral: 18.6  LVOT Area:     3.14 cm     LV Volumes (MOD)  LV vol d, MOD A2C: 69.9 ml  LV vol d, MOD A4C: 51.8 ml  LV vol s, MOD A2C: 26.9 ml  LV vol s, MOD A4C: 14.1 ml  LV SV MOD A2C:     43.0 ml  LV SV MOD A4C:     51.8 ml  LV SV MOD BP:      40.6 ml   RIGHT VENTRICLE  RV S prime:     15.10 cm/s  TAPSE (M-mode): 2.0 cm   LEFT ATRIUM              Index        RIGHT ATRIUM           Index  LA diam:        4.00 cm  1.85 cm/m   RA Area:     18.40 cm  LA Vol (A2C):   74.1 ml  34.26 ml/m  RA Volume:   56.90 ml  26.31 ml/m  LA Vol (A4C):   111.5 ml 51.55 ml/m  LA Biplane Vol: 91.0 ml  42.07 ml/m   AORTIC VALVE  AV Vmax:           510.60 cm/s  AV Vmean:          341.600 cm/s  AV VTI:            0.996 m  AV Peak Grad:      104.3 mmHg  AV Mean Grad:      54.0 mmHg  AR Vena Contracta: 0.25 cm     AORTA  Ao Root diam: 2.80 cm  Ao Asc diam:  3.20 cm   MITRAL VALVE  MV Area (PHT): 2.55 cm     SHUNTS  MV Mean grad:  3.0 mmHg     Systemic Diam:  2.00 cm  MV Decel Time: 298 msec  MV E velocity: 97.30 cm/s  MV A velocity: 144.00 cm/s  MV E/A ratio:  0.68   Dina Rich MD  Electronically signed by Dina Rich MD  Signature Date/Time: 03/11/2022/7:29:25 PM         Final       ADDENDUM REPORT: 04/15/2022 13:18   CLINICAL DATA:  Aortic Valve pathology with assessment for TAVR   EXAM: Cardiac TAVR CT   TECHNIQUE: The patient was scanned on a Siemens Force 192 slice scanner. A 120 kV retrospective scan was triggered in the descending thoracic aorta at 111 HU's. Gantry rotation speed was 270 msecs and collimation was .9 mm. No beta blockade or nitro were given. The 3D data set was reconstructed in 5% intervals of the R-R cycle. Systolic and diastolic phases were analyzed on a dedicated work station using MPR, MIP and VRT modes. The patient received 100 cc of contrast.   FINDINGS: Aortic Valve: Severely thickened tri-leaflet aortic valve with heavy calcification and reduced excursion the planimeter valve area is 0.798 Sq cm consistent with severe aortic stenosis   Annular calcification: Severe based on presence of LVOT calcification   Aortic Valve Calcium Score: 5748   Presence of basal septal hypertrophy: Yes, severe concentric hypertrophy septal thickness 21 mm in short axis assessment.   There is no systolic anterior motion of the mitral valve on cine assessment.   Systolic measurements greater than diastolic measurements, systolic measurements used (see below)   Perimembranous septal diameter: 12 mm   Mitral Valve: Moderate mitral annular calcification with calcified anterior and posterior mitral valve leaflets.   Aortic Annulus Measurements- 10 %   Major annulus diameter: 28 mm   Minor annulus diameter: 25 mm   Annular perimeter: 82 mm   Annular area: 5.19 cm2   Aortic Root Measurements   Sinotubular Junction: 30 mm   Ascending Thoracic Aorta: 33 mm   Aortic Arch: 26 mm   Descending  Thoracic Aorta: 28 mm   Aortic atherosclerosis.   Sinus of Valsalva Measurements:   Right coronary cusp width: 30 mm   Left coronary cusp width: 30 mm   Non coronary cusp width: 32 mm   Mean diameter: 31 mm   Coronary Artery Height above Annulus:   Left Main: 16 mm   Left SoV height: 25 mm   Right Coronary: 20 mm   Right SoV height: 25 mm   Optimum Fluoroscopic Angle for Delivery: LAO 3, CAU 1   Valves for structural team consideration:   26 mm Edward Sapien Valve   Sufficient coronary height, SoV height and diameter for 29 mm CoreValve   Sapien valve may be favored in the setting of left coronary calcium.   Non TAVR Valve Findings:   Coronary Arteries: Normal coronary origin. Small non dominant RCA with no coronary calcium. Study not completed with nitroglycerin. Unable to fully assess mid LAD and mid LCX.   Coronary Calcium Score:   Left main: 0   Left anterior descending artery: 437   Left circumflex artery: 234   Right coronary artery: 0   Total: 671   Interatrial septum: No interatrial communication   Pulmonary veins: Normal variant anatomy, presence of a right middle pulmonary vein   Main Pulmonary artery: Dilation of the main pulmonary artery: severe at 38 mm   Trunk to aorta ratio of 1.1.   Left atrial  appendage: Patent   Extra Cardiac Findings as per separate reporting.   IMPRESSION: 1. Severe Aortic stenosis. Findings pertinent to TAVR procedure are detailed above.   2. Patient's total coronary artery calcium score is 671.   3. Compared to 2021 Cardiac CT, further pulmonary artery dilation. No change in valve sizing recommendations.   3. Dilation of the main pulmonary artery. This can be associated with the presence of pulmonary hypertension; clinical correlation advised.   RECOMMENDATIONS:   The proposed cut-off value of 1,651 AU yielded a 93 % sensitivity and 75 % specificity in grading AS severity in patients  with classical low-flow, low-gradient AS. Proposed different cut-off values to define severe AS for men and women as 2,065 AU and 1,274 AU, respectively. The joint European and American recommendations for the assessment of AS consider the aortic valve calcium score as a continuum - a very high calcium score suggests severe AS and a low calcium score suggests severe AS is unlikely.   Baumgartner H, Falk V, Bax JJ, et al. 2017 ESC/EACTS Guidelines for the management of valvular heart disease. Eur Heart J 2017;38:2739-91   Coronary artery calcium (CAC) score is a strong predictor of incident coronary heart disease (CHD) and provides predictive information beyond traditional risk factors. CAC scoring is reasonable to use in the decision to withhold, postpone, or initiate statin therapy in intermediate-risk or selected borderline-risk asymptomatic adults (age 40-75 years and LDL-C >=70 to <190 mg/dL) who do not have diabetes or established atherosclerotic cardiovascular disease (ASCVD).* In intermediate-risk (10-year ASCVD risk >=7.5% to <20%) adults or selected borderline-risk (10-year ASCVD risk >=5% to <7.5%) adults in whom a CAC score is measured for the purpose of making a treatment decision the following recommendations have been made:   If CAC = 0, it is reasonable to withhold statin therapy and reassess in 5 to 10 years, as long as higher risk conditions are absent (diabetes mellitus, family history of premature CHD in first degree relatives (males <55 years; females <65 years), cigarette smoking, LDL >=190 mg/dL or other independent risk factors).   If CAC is 1 to 99, it is reasonable to initiate statin therapy for patients >=55 years of age.   If CAC is >=100 or >=75th percentile, it is reasonable to initiate statin therapy at any age.   Cardiology referral should be considered for patients with CAC scores >=400 or >=75th percentile.   *2018  AHA/ACC/AACVPR/AAPA/ABC/ACPM/ADA/AGS/APhA/ASPC/NLA/PCNA Guideline on the Management of Blood Cholesterol: A Report of the American College of Cardiology/American Heart Association Task Force on Clinical Practice Guidelines. J Am Coll Cardiol. 2019;73(24):3168-3209.   Mahesh  Chandrasekhar     Electronically Signed   By: Mahesh   Chandrasekhar M.D.   On: 04/15/2022 13:18    Addended by Chandrasekhar, Mahesh A, MD on 04/15/2022  1:20 PM    Study Result   Narrative & Impression  EXAM: OVER-READ INTERPRETATION  CT CHEST   The following report is a limited chest CT over-read performed by radiologist Dr. Leah Strickland of [facility] on 04/15/2022. This over-read does not include interpretation of cardiac or coronary anatomy or pathology. The cardiac CTA interpretation by the cardiologist is attached.   COMPARISON:  None Available.   FINDINGS: Extracardiac findings will be described separately under dictation for contemporaneously obtained CTA chest, abdomen and pelvis.   IMPRESSION: Please see separate dictation for contemporaneously obtained CTA chest, abdomen and pelvis dated 04/15/2022 for full description of relevant extracardiac findings.   Electronically Signed: By: Leah    Strickland M.D. On: 04/15/2022 11:13        Narrative & Impression  CLINICAL DATA:  Preop aortic valve replacement   EXAM: CT ANGIOGRAPHY CHEST, ABDOMEN AND PELVIS   TECHNIQUE: Non-contrast CT of the chest was initially obtained.   Multidetector CT imaging through the chest, abdomen and pelvis was performed using the standard protocol during bolus administration of intravenous contrast. Multiplanar reconstructed images and MIPs were obtained and reviewed to evaluate the vascular anatomy.   RADIATION DOSE REDUCTION: This exam was performed according to the departmental dose-optimization program which includes automated exposure control, adjustment of the mA and/or kV according  to patient size and/or use of iterative reconstruction technique.   CONTRAST:  116mL OMNIPAQUE IOHEXOL 350 MG/ML SOLN   COMPARISON:  Preop TAVR evaluation dated July 09, 2020; Dotatate PET-CT dated August 25, 2020   FINDINGS: CTA CHEST FINDINGS   Cardiovascular: Normal heart size. No pericardial effusion. Aortic valve thickening and calcifications. Coronary artery calcifications of the LAD and circumflex. Normal caliber thoracic aorta with severe atherosclerotic disease including ulcerated soft plaque.   Mediastinum/Nodes: Moderate hiatal hernia. No pathologically enlarged lymph nodes seen in the chest.   Lungs/Pleura: Central airways are patent. Mild bibasilar opacities. No consolidation, pleural effusion or pneumothorax. Stable solid pulmonary nodule of the right lower lobe measuring 4 mm on series 5, image 69, considered benign.   Musculoskeletal: No chest wall abnormality. No acute or significant osseous findings.   CTA ABDOMEN AND PELVIS FINDINGS   Hepatobiliary: No focal liver abnormality is seen. Status post cholecystectomy. No biliary dilatation.   Pancreas: Hypervascular lesion of the pancreatic head measuring 1.0 cm on series 4, image 108, unchanged when compared with prior. No main pancreatic duct dilation.   Spleen: Normal in size without focal abnormality.   Adrenals/Urinary Tract: Stable small adrenal gland nodule measuring 1.0 cm, likely an adenoma. No hydronephrosis or nephrolithiasis. Subcentimeter indeterminate upper pole left renal lesion measuring 9 mm on series 4, image 94, previously 7 mm. Low-attenuation lesion of the lower pole of the right kidney, likely a simple cyst. Additional small bilateral renal lesions are seen which are too small to completely characterize. Limited evaluation of the bladder due to streak artifact, within limitations there is bladder wall thickening.   Stomach/Bowel: Stomach is within normal limits. Diverticulosis.  No evidence of bowel wall thickening, distention, or inflammatory changes.   Vascular/lymphatic: Severe atherosclerotic disease of the abdominal aorta consisting of calcified and noncalcified plaque. Mild narrowing at the origin of the celiac and left renal artery due to soft plaque. Mild narrowing of the distal SMA due to soft plaque. Pathologically enlarged lymph nodes seen in the abdomen or pelvis.   Reproductive: Prostate is unremarkable.   Other: Small fat containing right inguinal hernia. Small fat containing umbilical hernia.   Musculoskeletal: Left total hip arthroplasty. Levocurvature of the lumbar spine. No aggressive appearing osseous lesions.   VASCULAR MEASUREMENTS PERTINENT TO TAVR:   AORTA:   Minimal Aortic Diameter-12.1 mm   Severity of Aortic Calcification-severe   RIGHT PELVIS:   Right Common Iliac Artery -   Minimal Diameter-8.0 mm   Tortuosity-mild   Calcification-moderate   Right External Iliac Artery -   Minimal Diameter-6.1 mm   Tortuosity-moderate   Calcification-mild   Right Common Femoral Artery -   Minimal Diameter-6.8 mm   Tortuosity-none   Calcification-mild   LEFT PELVIS:   Left Common Iliac Artery -   Minimal Diameter-6.8 mm   Tortuosity-moderate   Calcification-mild   Left  External Iliac Artery -   Minimal Diameter-5.8 mm   Tortuosity-mild   Calcification-mild   Left Common Femoral Artery -   Evaluation limited due to streak artifact from hip arthroplasty.   Minimal Diameter-5.2 mm   Tortuosity-none   Calcification-mild   Review of the MIP images confirms the above findings.   IMPRESSION: Vascular:   1. Vascular findings and measurements pertinent to potential TAVR procedure, as detailed above. 2. Severe thickening and calcification of the aortic valve, compatible with reported clinical history of aortic stenosis. 3. Severe aortoiliac atherosclerosis. Three vessel coronary artery disease.    Nonvascular:   1. Stable hypervascular lesion of the pancreatic head, compatible with indolent endocrine neoplasm. See Dotatate PET-CT dated August 25, 2020 for further evaluation. 2. Subcentimeter indeterminate upper pole left renal lesion is slightly increased in size when compared with prior exam. Recommend renal protocol CT or MRI for further evaluation.     Electronically Signed   By: Yetta Glassman M.D.   On: 04/15/2022 12:21      Impression:   This 88 year old woman has stage D, critical aortic stenosis with NYHA class II symptoms of fatigue and generalized weakness as well as substernal chest discomfort.  I have personally reviewed her 2D echocardiogram, cardiac catheterization, and CTA studies.  Her echo shows a severely calcified aortic valve with restricted leaflet mobility.  The mean gradient was measured at 54 mmHg with a peak gradient of 104 mmHg consistent with critical aortic stenosis.  Left ventricular ejection fraction is normal.  Cardiac catheterization in October 2021 showed mild nonobstructive coronary disease.  The mean gradient across her aortic valve at that time was 48 mmHg.  I agree that aortic valve replacement is indicated in this patient for relief of her worsening symptoms and to prevent left ventricular dysfunction and death.  Given her advanced age and limited mobility due to being wheelchair-bound from severe lower extremity degenerative arthritis I think that transcatheter aortic valve replacement would be the only option for treating her.  Her gated cardiac CTA shows anatomy suitable for TAVR using a SAPIEN 3 valve.  Her abdominal and pelvic CTA shows adequate pelvic vascular anatomy to allow transfemoral insertion.   The patient and her daughter were counseled at length regarding treatment alternatives for management of severe symptomatic aortic stenosis. The risks and benefits of surgical intervention has been discussed in detail. Long-term prognosis with  medical therapy was discussed. Alternative approaches such as conventional surgical aortic valve replacement, transcatheter aortic valve replacement, and palliative medical therapy were compared and contrasted at length. This discussion was placed in the context of the patient's own specific clinical presentation and past medical history. All of their questions have been addressed.    Following the decision to proceed with transcatheter aortic valve replacement, a discussion was held regarding what types of management strategies would be attempted intraoperatively in the event of life-threatening complications, including whether or not the patient would be considered a candidate for the use of cardiopulmonary bypass and/or conversion to open sternotomy for attempted surgical intervention.  I do not think she is a candidate for emergent sternotomy to manage any intraoperative complications or cardiopulmonary bypass. The patient has been advised of a variety of complications that might develop including but not limited to risks of death, stroke, paravalvular leak, aortic dissection or other major vascular complications, aortic annulus rupture, device embolization, cardiac rupture or perforation, mitral regurgitation, acute myocardial infarction, arrhythmia, heart block or bradycardia requiring permanent pacemaker placement, congestive heart failure,  respiratory failure, renal failure, pneumonia, infection, other late complications related to structural valve deterioration or migration, or other complications that might ultimately cause a temporary or permanent loss of functional independence or other long term morbidity. The patient provides full informed consent for the procedure as described and all questions were answered.       Plan:   Transfemoral TAVR using a SAPIEN 3 valve.         Gaye Pollack, MD

## 2022-05-25 ENCOUNTER — Inpatient Hospital Stay (HOSPITAL_COMMUNITY): Payer: Medicare Other | Admitting: Anesthesiology

## 2022-05-25 ENCOUNTER — Other Ambulatory Visit: Payer: Self-pay

## 2022-05-25 ENCOUNTER — Inpatient Hospital Stay (HOSPITAL_COMMUNITY): Payer: Medicare Other

## 2022-05-25 ENCOUNTER — Inpatient Hospital Stay (HOSPITAL_COMMUNITY)
Admission: RE | Admit: 2022-05-25 | Discharge: 2022-05-26 | DRG: 267 | Disposition: A | Discharge status: Home / Self Care | Payer: Medicare Other | Attending: Cardiovascular Disease | Admitting: Cardiovascular Disease

## 2022-05-25 ENCOUNTER — Other Ambulatory Visit: Payer: Self-pay | Admitting: Cardiology

## 2022-05-25 ENCOUNTER — Encounter (HOSPITAL_COMMUNITY): Payer: Self-pay | Admitting: Cardiovascular Disease

## 2022-05-25 ENCOUNTER — Encounter (HOSPITAL_COMMUNITY)
Admission: RE | Disposition: A | Discharge status: Home / Self Care | Payer: Medicare Other | Source: Home / Self Care | Attending: Cardiovascular Disease

## 2022-05-25 DIAGNOSIS — Z006 Encounter for examination for normal comparison and control in clinical research program: Secondary | ICD-10-CM

## 2022-05-25 DIAGNOSIS — Z993 Dependence on wheelchair: Secondary | ICD-10-CM

## 2022-05-25 DIAGNOSIS — Z87891 Personal history of nicotine dependence: Secondary | ICD-10-CM | POA: Diagnosis not present

## 2022-05-25 DIAGNOSIS — Z800 Family history of malignant neoplasm of digestive organs: Secondary | ICD-10-CM

## 2022-05-25 DIAGNOSIS — Z96653 Presence of artificial knee joint, bilateral: Secondary | ICD-10-CM | POA: Diagnosis present

## 2022-05-25 DIAGNOSIS — I447 Left bundle-branch block, unspecified: Secondary | ICD-10-CM | POA: Diagnosis present

## 2022-05-25 DIAGNOSIS — E039 Hypothyroidism, unspecified: Secondary | ICD-10-CM | POA: Diagnosis present

## 2022-05-25 DIAGNOSIS — Z7982 Long term (current) use of aspirin: Secondary | ICD-10-CM | POA: Diagnosis not present

## 2022-05-25 DIAGNOSIS — F418 Other specified anxiety disorders: Secondary | ICD-10-CM | POA: Diagnosis not present

## 2022-05-25 DIAGNOSIS — Z952 Presence of prosthetic heart valve: Secondary | ICD-10-CM

## 2022-05-25 DIAGNOSIS — F419 Anxiety disorder, unspecified: Secondary | ICD-10-CM | POA: Diagnosis present

## 2022-05-25 DIAGNOSIS — I2510 Atherosclerotic heart disease of native coronary artery without angina pectoris: Secondary | ICD-10-CM | POA: Diagnosis present

## 2022-05-25 DIAGNOSIS — I350 Nonrheumatic aortic (valve) stenosis: Secondary | ICD-10-CM

## 2022-05-25 DIAGNOSIS — R54 Age-related physical debility: Secondary | ICD-10-CM | POA: Diagnosis not present

## 2022-05-25 DIAGNOSIS — E785 Hyperlipidemia, unspecified: Secondary | ICD-10-CM | POA: Diagnosis not present

## 2022-05-25 DIAGNOSIS — K8050 Calculus of bile duct without cholangitis or cholecystitis without obstruction: Secondary | ICD-10-CM | POA: Diagnosis present

## 2022-05-25 DIAGNOSIS — Z96642 Presence of left artificial hip joint: Secondary | ICD-10-CM | POA: Diagnosis present

## 2022-05-25 DIAGNOSIS — Z821 Family history of blindness and visual loss: Secondary | ICD-10-CM | POA: Diagnosis not present

## 2022-05-25 DIAGNOSIS — Z79899 Other long term (current) drug therapy: Secondary | ICD-10-CM | POA: Diagnosis not present

## 2022-05-25 DIAGNOSIS — I10 Essential (primary) hypertension: Secondary | ICD-10-CM | POA: Diagnosis present

## 2022-05-25 DIAGNOSIS — Z79890 Hormone replacement therapy (postmenopausal): Secondary | ICD-10-CM | POA: Diagnosis not present

## 2022-05-25 DIAGNOSIS — F32A Depression, unspecified: Secondary | ICD-10-CM | POA: Diagnosis not present

## 2022-05-25 HISTORY — PX: INTRAOPERATIVE TRANSTHORACIC ECHOCARDIOGRAM: SHX6523

## 2022-05-25 HISTORY — PX: TRANSCATHETER AORTIC VALVE REPLACEMENT, TRANSFEMORAL: SHX6400

## 2022-05-25 HISTORY — DX: Presence of prosthetic heart valve: Z95.2

## 2022-05-25 LAB — TYPE AND SCREEN
ABO/RH(D): O POS
Antibody Screen: NEGATIVE
Unit division: 0
Unit division: 0

## 2022-05-25 LAB — POCT I-STAT 7, (LYTES, BLD GAS, ICA,H+H)
Acid-Base Excess: 5 mmol/L — ABNORMAL HIGH (ref 0.0–2.0)
Bicarbonate: 31.2 mmol/L — ABNORMAL HIGH (ref 20.0–28.0)
Calcium, Ion: 1.14 mmol/L — ABNORMAL LOW (ref 1.15–1.40)
HCT: 32 % — ABNORMAL LOW (ref 36.0–46.0)
Hemoglobin: 10.9 g/dL — ABNORMAL LOW (ref 12.0–15.0)
O2 Saturation: 100 %
Potassium: 3.8 mmol/L (ref 3.5–5.1)
Sodium: 140 mmol/L (ref 135–145)
TCO2: 33 mmol/L — ABNORMAL HIGH (ref 22–32)
pCO2 arterial: 50.3 mmHg — ABNORMAL HIGH (ref 32–48)
pH, Arterial: 7.401 (ref 7.35–7.45)
pO2, Arterial: 411 mmHg — ABNORMAL HIGH (ref 83–108)

## 2022-05-25 LAB — POCT I-STAT, CHEM 8
BUN: 17 mg/dL (ref 8–23)
BUN: 15 mg/dL (ref 8–23)
Calcium, Ion: 1.18 mmol/L (ref 1.15–1.40)
Calcium, Ion: 1.15 mmol/L (ref 1.15–1.40)
Chloride: 102 mmol/L (ref 98–111)
Chloride: 105 mmol/L (ref 98–111)
Creatinine, Ser: 0.6 mg/dL (ref 0.44–1.00)
Creatinine, Ser: 0.6 mg/dL (ref 0.44–1.00)
Glucose, Bld: 138 mg/dL — ABNORMAL HIGH (ref 70–99)
Glucose, Bld: 125 mg/dL — ABNORMAL HIGH (ref 70–99)
HCT: 33 % — ABNORMAL LOW (ref 36.0–46.0)
HCT: 32 % — ABNORMAL LOW (ref 36.0–46.0)
Hemoglobin: 11.2 g/dL — ABNORMAL LOW (ref 12.0–15.0)
Hemoglobin: 10.9 g/dL — ABNORMAL LOW (ref 12.0–15.0)
Potassium: 3.8 mmol/L (ref 3.5–5.1)
Potassium: 3.3 mmol/L — ABNORMAL LOW (ref 3.5–5.1)
Sodium: 142 mmol/L (ref 135–145)
Sodium: 143 mmol/L (ref 135–145)
TCO2: 29 mmol/L (ref 22–32)
TCO2: 25 mmol/L (ref 22–32)

## 2022-05-25 LAB — BPAM RBC
Blood Product Expiration Date: 202309282359
Blood Product Expiration Date: 202309282359
ISSUE DATE / TIME: 202308251939
ISSUE DATE / TIME: 202308251939
Unit Type and Rh: 5100
Unit Type and Rh: 5100

## 2022-05-25 LAB — ECHOCARDIOGRAM LIMITED
AR max vel: 2.81 cm2
AV Area VTI: 2.8 cm2
AV Area mean vel: 3.11 cm2
AV Mean grad: 8 mmHg
AV Peak grad: 12.4 mmHg
Ao pk vel: 1.76 m/s

## 2022-05-25 LAB — PROTIME-INR
INR: 1 (ref 0.8–1.2)
Prothrombin Time: 13.3 seconds (ref 11.4–15.2)

## 2022-05-25 SURGERY — IMPLANTATION, AORTIC VALVE, TRANSCATHETER, FEMORAL APPROACH
Anesthesia: General | Laterality: Right

## 2022-05-25 MED ORDER — ROCURONIUM BROMIDE 10 MG/ML (PF) SYRINGE
PREFILLED_SYRINGE | INTRAVENOUS | Status: DC | PRN
  Administered 2022-05-25: 60 mg via INTRAVENOUS

## 2022-05-25 MED ORDER — CLEVIDIPINE BUTYRATE 0.5 MG/ML IV EMUL
INTRAVENOUS | Status: DC | PRN
  Administered 2022-05-25: 4 mg/h via INTRAVENOUS
  Administered 2022-05-25: 2 mg/h via INTRAVENOUS

## 2022-05-25 MED ORDER — CEFAZOLIN SODIUM-DEXTROSE 2-4 GM/100ML-% IV SOLN
2.0000 g | Freq: Three times a day (TID) | INTRAVENOUS | Status: AC
  Administered 2022-05-25 – 2022-05-26 (×2): 2 g via INTRAVENOUS
  Filled 2022-05-25 (×2): qty 100

## 2022-05-25 MED ORDER — HEPARIN SODIUM (PORCINE) 1000 UNIT/ML IJ SOLN
INTRAMUSCULAR | Status: DC | PRN
  Administered 2022-05-25: 18000 [IU] via INTRAVENOUS

## 2022-05-25 MED ORDER — SODIUM CHLORIDE 0.9% FLUSH: 3.0000 mL | Freq: Two times a day (BID) | INTRAVENOUS | Status: DC

## 2022-05-25 MED ORDER — SODIUM CHLORIDE 0.9% FLUSH: 3.0000 mL | INTRAVENOUS | Status: DC | PRN

## 2022-05-25 MED ORDER — SUGAMMADEX SODIUM 200 MG/2ML IV SOLN
INTRAVENOUS | Status: DC | PRN
  Administered 2022-05-25: 200 mg via INTRAVENOUS

## 2022-05-25 MED ORDER — DIPHENHYDRAMINE HCL 50 MG/ML IJ SOLN
INTRAMUSCULAR | Status: DC | PRN
  Administered 2022-05-25: 25 mg via INTRAVENOUS

## 2022-05-25 MED ORDER — SODIUM CHLORIDE 0.9 % IV SOLN: 250.0000 mL | INTRAVENOUS | Status: DC

## 2022-05-25 MED ORDER — SODIUM CHLORIDE 0.9 % IV SOLN: 250.0000 mL | INTRAVENOUS | Status: DC | PRN

## 2022-05-25 MED ORDER — SODIUM CHLORIDE 0.9 % IV SOLN: INTRAVENOUS | Status: DC

## 2022-05-25 MED ORDER — ASPIRIN 81 MG PO TBEC
81.0000 mg | DELAYED_RELEASE_TABLET | Freq: Every day | ORAL | Status: DC
  Administered 2022-05-26: 81 mg via ORAL
  Filled 2022-05-25: qty 1

## 2022-05-25 MED ORDER — ETOMIDATE 2 MG/ML IV SOLN
INTRAVENOUS | Status: DC | PRN
  Administered 2022-05-25: 14 mg via INTRAVENOUS

## 2022-05-25 MED ORDER — TRAMADOL HCL 50 MG PO TABS: 50.0000 mg | ORAL_TABLET | ORAL | Status: DC | PRN

## 2022-05-25 MED ORDER — ACETAMINOPHEN ER 650 MG PO TBCR: 650.0000 mg | EXTENDED_RELEASE_TABLET | Freq: Three times a day (TID) | ORAL | Status: DC | PRN

## 2022-05-25 MED ORDER — DIPHENHYDRAMINE HCL 50 MG/ML IJ SOLN
INTRAMUSCULAR | Status: AC
Start: 2022-05-25 — End: ?
  Filled 2022-05-25: qty 1

## 2022-05-25 MED ORDER — IOHEXOL 350 MG/ML SOLN
INTRAVENOUS | Status: DC | PRN
  Administered 2022-05-25: 20 mL

## 2022-05-25 MED ORDER — CHLORHEXIDINE GLUCONATE 0.12 % MT SOLN
15.0000 mL | Freq: Once | OROMUCOSAL | Status: DC
  Filled 2022-05-25: qty 15

## 2022-05-25 MED ORDER — ONDANSETRON HCL 4 MG/2ML IJ SOLN: 4.0000 mg | Freq: Four times a day (QID) | INTRAMUSCULAR | Status: DC | PRN

## 2022-05-25 MED ORDER — ONDANSETRON HCL 4 MG/2ML IJ SOLN
INTRAMUSCULAR | Status: DC | PRN
  Administered 2022-05-25: 4 mg via INTRAVENOUS

## 2022-05-25 MED ORDER — ACETAMINOPHEN 325 MG PO TABS: 650.0000 mg | ORAL_TABLET | Freq: Four times a day (QID) | ORAL | Status: DC | PRN

## 2022-05-25 MED ORDER — ACETAMINOPHEN 650 MG RE SUPP: 650.0000 mg | Freq: Four times a day (QID) | RECTAL | Status: DC | PRN

## 2022-05-25 MED ORDER — NITROGLYCERIN IN D5W 200-5 MCG/ML-% IV SOLN: 0.0000 ug/min | INTRAVENOUS | Status: DC

## 2022-05-25 MED ORDER — EPHEDRINE SULFATE (PRESSORS) 50 MG/ML IJ SOLN
INTRAMUSCULAR | Status: DC | PRN
  Administered 2022-05-25 (×2): 5 mg via INTRAVENOUS

## 2022-05-25 MED ORDER — CHLORHEXIDINE GLUCONATE 4 % EX LIQD: 60.0000 mL | Freq: Once | CUTANEOUS | Status: DC

## 2022-05-25 MED ORDER — LEVOTHYROXINE SODIUM 25 MCG PO TABS
125.0000 ug | ORAL_TABLET | Freq: Every day | ORAL | Status: DC
  Administered 2022-05-26: 125 ug via ORAL
  Filled 2022-05-25: qty 1

## 2022-05-25 MED ORDER — HEPARIN (PORCINE) IN NACL 1000-0.9 UT/500ML-% IV SOLN
INTRAVENOUS | Status: AC
Start: 2022-05-25 — End: ?
  Filled 2022-05-25: qty 500

## 2022-05-25 MED ORDER — MORPHINE SULFATE (PF) 2 MG/ML IV SOLN: 1.0000 mg | INTRAVENOUS | Status: DC | PRN

## 2022-05-25 MED ORDER — LIDOCAINE HCL 1 % IJ SOLN
INTRAMUSCULAR | Status: DC | PRN
  Administered 2022-05-25 (×2): 10 mL

## 2022-05-25 MED ORDER — ACETAMINOPHEN 500 MG PO TABS
1000.0000 mg | ORAL_TABLET | Freq: Once | ORAL | Status: AC
  Administered 2022-05-25: 1000 mg via ORAL
  Filled 2022-05-25: qty 2

## 2022-05-25 MED ORDER — HEPARIN (PORCINE) IN NACL 1000-0.9 UT/500ML-% IV SOLN
INTRAVENOUS | Status: DC | PRN
  Administered 2022-05-25 (×3): 500 mL

## 2022-05-25 MED ORDER — LIDOCAINE 2% (20 MG/ML) 5 ML SYRINGE
INTRAMUSCULAR | Status: DC | PRN
  Administered 2022-05-25: 80 mg via INTRAVENOUS

## 2022-05-25 MED ORDER — OXYCODONE HCL 5 MG PO TABS: 5.0000 mg | ORAL_TABLET | ORAL | Status: DC | PRN

## 2022-05-25 MED ORDER — PAROXETINE HCL 20 MG PO TABS
20.0000 mg | ORAL_TABLET | Freq: Every day | ORAL | Status: DC
  Administered 2022-05-26: 20 mg via ORAL
  Filled 2022-05-25: qty 1

## 2022-05-25 MED ORDER — LIDOCAINE HCL (PF) 1 % IJ SOLN
INTRAMUSCULAR | Status: AC
  Filled 2022-05-25: qty 30

## 2022-05-25 MED ORDER — HEPARIN (PORCINE) IN NACL 1000-0.9 UT/500ML-% IV SOLN
INTRAVENOUS | Status: AC
  Filled 2022-05-25: qty 500

## 2022-05-25 MED ORDER — CHLORHEXIDINE GLUCONATE 4 % EX LIQD: 30.0000 mL | CUTANEOUS | Status: DC

## 2022-05-25 MED ORDER — SODIUM CHLORIDE 0.9 % IV SOLN: INTRAVENOUS | Status: AC

## 2022-05-25 MED ORDER — PROTAMINE SULFATE 10 MG/ML IV SOLN
INTRAVENOUS | Status: DC | PRN
  Administered 2022-05-25: 180 mg via INTRAVENOUS

## 2022-05-25 MED ORDER — LACTATED RINGERS IV SOLN: INTRAVENOUS | Status: DC | PRN

## 2022-05-25 MED ORDER — PHENYLEPHRINE 80 MCG/ML (10ML) SYRINGE FOR IV PUSH (FOR BLOOD PRESSURE SUPPORT)
PREFILLED_SYRINGE | INTRAVENOUS | Status: DC | PRN
  Administered 2022-05-25: 80 ug via INTRAVENOUS

## 2022-05-25 MED ORDER — OXYBUTYNIN CHLORIDE ER 5 MG PO TB24
15.0000 mg | ORAL_TABLET | Freq: Every day | ORAL | Status: DC
  Administered 2022-05-26: 15 mg via ORAL
  Filled 2022-05-25: qty 1

## 2022-05-25 SURGICAL SUPPLY — 34 items
BAG SNAP BAND KOVER 36X36 (MISCELLANEOUS) ×6 IMPLANT
BLANKET WARM UNDERBOD FULL ACC (MISCELLANEOUS) ×3 IMPLANT
CABLE ADAPT PACING TEMP 12FT (ADAPTER) IMPLANT
CATH 26 ULTRA DELIVERY (CATHETERS) IMPLANT
CATH DIAG 6FR PIGTAIL ANGLED (CATHETERS) IMPLANT
CATH INFINITI 6F AL1 (CATHETERS) IMPLANT
CATH S G BIP PACING (CATHETERS) IMPLANT
CLOSURE MYNX CONTROL 6F/7F (Vascular Products) IMPLANT
CLOSURE PERCLOSE PROSTYLE (VASCULAR PRODUCTS) IMPLANT
CRIMPER (MISCELLANEOUS) IMPLANT
DEVICE INFLATION ATRION QL2530 (MISCELLANEOUS) IMPLANT
ELECT DEFIB PAD ADLT CADENCE (PAD) IMPLANT
GLOVE ECLIPSE 7.0 STRL STRAW (GLOVE) ×3 IMPLANT
KIT HEART LEFT (KITS) ×3 IMPLANT
KIT MICROPUNCTURE NIT STIFF (SHEATH) IMPLANT
KIT SAPIAN 3 ULTRA RESILIA 26 (Valve) IMPLANT
MAT PREVALON FULL STRYKER (MISCELLANEOUS) IMPLANT
PACK CARDIAC CATHETERIZATION (CUSTOM PROCEDURE TRAY) ×3 IMPLANT
SHEATH BRITE TIP 7FR 35CM (SHEATH) IMPLANT
SHEATH INTRODUCER SET 20-26 (SHEATH) IMPLANT
SHEATH PINNACLE 6F 10CM (SHEATH) IMPLANT
SHEATH PINNACLE 8F 10CM (SHEATH) IMPLANT
SHEATH PROBE COVER 6X72 (BAG) IMPLANT
SLEEVE REPOSITIONING LENGTH 30 (MISCELLANEOUS) IMPLANT
STOPCOCK MORSE 400PSI 3WAY (MISCELLANEOUS) ×6 IMPLANT
SYR MEDRAD MARK V 150ML (SYRINGE) IMPLANT
TRANSDUCER W/STOPCOCK (MISCELLANEOUS) ×6 IMPLANT
TUBING CIL FLEX 10 FLL-RA (TUBING) IMPLANT
TUBING CONTRAST HIGH PRESS 48 (TUBING) IMPLANT
WIRE AMPLATZ SS-J .035X180CM (WIRE) IMPLANT
WIRE EMERALD 3MM-J .035X150CM (WIRE) IMPLANT
WIRE EMERALD 3MM-J .035X260CM (WIRE) IMPLANT
WIRE EMERALD ST .035X260CM (WIRE) ×3 IMPLANT
WIRE SAFARI SM CURVE 275 (WIRE) IMPLANT

## 2022-05-25 NOTE — Progress Notes (Signed)
  [facility]   MULTIDISCIPLINARY HEART VALVE TEAM  Patient doing well s/p TAVR. She is hemodynamically stable. Groin sites stable. ECG with sinus with new LBBB but no high grade block. Arterial line discontinued and transferred to 4E. Plan for early ambulation after bedrest completed and hopeful discharge over the next 24-48 hours.   Cline Crock PA-C  [facility]  Pager [phone]

## 2022-05-25 NOTE — Progress Notes (Signed)
Patient received from Cath Lab. Bilateral groin site level 0.  Vitals checked.patient is allergic to CHG.Patient oriented X4. Patient oriented to unit, call bell in reach.

## 2022-05-25 NOTE — Interval H&P Note (Signed)
History and Physical Interval Note:  05/25/2022 7:03 AM  [name]  has presented today for surgery, with the diagnosis of Critical Aortic Stenosis.  The various methods of treatment have been discussed with the patient and family. After consideration of risks, benefits and other options for treatment, the patient has consented to  Procedure(s): Transcatheter Aortic Valve Replacement, Transfemoral (Right) INTRAOPERATIVE TRANSTHORACIC ECHOCARDIOGRAM (N/A) as a surgical intervention.  [name] history has been reviewed, patient examined, no change in status, stable for surgery.  I have reviewed the patient's chart and labs.  Questions were answered to the patient's satisfaction.     [name]

## 2022-05-25 NOTE — Discharge Summary (Incomplete)
[place] VALVE TEAM  Discharge Summary    Patient ID: [name] MRN: [id number]; DOB: 1933-09-01  Admit date: 05/25/2022 Discharge date: 05/26/2022  Primary Care Provider: Sharilyn Sites, MD  Primary Cardiologist: Rozann Lesches, MD / Dr. Buena Irish & Dr. Laverta Baltimore (TAVR)  Discharge Diagnoses    Principal Problem:   S/P TAVR (transcatheter aortic valve replacement) Active Problems:   Hypothyroidism   Hyperlipidemia   Depression   Anxiety   Biliary colic   Severe aortic stenosis   Allergies Allergies  Allergen Reactions   Chlorhexidine Dermatitis and Rash    Diagnostic Studies/Procedures    [facility]  TAVR OPERATIVE NOTE     Date of Procedure:                05/25/2022   Preoperative Diagnosis:      Severe Aortic Stenosis    Postoperative Diagnosis:    Same    Procedure:        Transcatheter Aortic Valve Replacement - Transfemoral Approach     Edwards Sapien 3 THV (size 26 mm, model # [id number], serial # [id number])              Co-Surgeons:                        Lauree Chandler, MD and Gaye Pollack, MD    Anesthesiologist:                  Gifford Shave   Echocardiographer:              Gasper Sells   Pre-operative Echo Findings: Severe aortic stenosis Normal left ventricular systolic function   Post-operative Echo Findings: No paravalvular leak Normal left ventricular systolic function _____________    Echo 05/26/22: completed but pending formal read at the time of discharge   History of Present Illness     [name] is a 88 y.o. female with a history of arthritis, CAD, chronic bronchitis, anxiety/depression, HLD, hypothyroidism, wheelchair bound and severe aortic stenosis who [facility]22 for planned TAVR.   The structural heart team initially evaluated her on 08/27/2020 for consideration of TAVR. L/RHC in 06/2020 showed mild non-obstructive CAD. At that time she  looked very frail and it was not clear that she was having significant symptoms and it was decided to hold off on TAVR. We decided to continue following her aortic stenosis.  Her most recent echocardiogram on 03/11/2022 showed an increase in the mean gradient to 68 mmHg with a peak gradient of 104 mmHg.  Left ventricular ejection fraction was 65 to 70% with severe LVH. She reported having recent episodes of substernal chest discomfort in the center of her chest that last about 30 minutes as well as progressive fatigue and generalized weakness. She reported living alone and taking care of all of her own ADLs.    The patient was evaluated by the multidisciplinary valve team and felt to have severe, symptomatic aortic stenosis and to be a suitable candidate for TAVR, which was set up for 05/18/22. However, this case was cancelled last minute due to N/V in the pre op area. She was admitted overnight for observation. She was started on Laisix 20mg  daily given LE edema. Her case was r/s to 05/25/22.  Hospital Course     Consultants: none   Severe AS: s/p successful TAVR with a 26 mm Edwards Sapien 3 Ultra Resilia  THV via the TF approach on 05/25/22. Post operative echo pending. Groin sites are stable. Continued on home Asprin 81 mg daily. Plan for discharge home today with close follow up in the office.   New LBBB: will place a Zio AT at discharge to rule out HAVB  LE edema: planned to start her on lasix last discharge but medication was never called in. LE edema is stable. Will watch her edema now that valve is replaced.  HTN: BP well controlled. Resume home meds.   Renal lesion: pre TAVR CT showed a subcentimeter indeterminate upper pole left renal lesion is slightly increased in size when compared with prior exam. Recommend renal protocol CT or MRI for further evaluation. This will be discussed in the outpatient setting.  _____________  Discharge Vitals Blood pressure 120/67, pulse 79, temperature 99.6  F (37.6 C), temperature source Oral, resp. rate 18, height 5\' 9"  (1.753 m), weight 92 kg, SpO2 95 %.  Filed Weights   05/25/22 0733 05/26/22 0610  Weight: 90.7 kg 92 kg     GEN: elderly and frail appearing. overweight HEENT: normal Neck: no JVD or masses Cardiac: RRR; no murmurs, rubs, or gallops,1+ bilateral edema  Respiratory:  clear to auscultation bilaterally, normal work of breathing GI: soft, nontender, nondistended, + BS MS: no deformity or atrophy Skin: warm and dry, no rash.  Groin sites clear without hematoma or ecchymosis  Neuro:  Alert and Oriented x 3, Strength and sensation are intact Psych: euthymic mood, full affect   Labs & Radiologic Studies    CBC Recent Labs    05/25/22 1140 05/26/22 0534  WBC  --  9.9  HGB 10.9* 11.2*  HCT 32.0* 35.7*  MCV  --  89.0  PLT  --  254   Basic Metabolic Panel Recent Labs    05/28/22 1140 05/26/22 0534  NA 143 139  K 3.3* 3.7  CL 105 104  CO2  --  28  GLUCOSE 125* 113*  BUN 15 14  CREATININE 0.60 0.95  CALCIUM  --  7.9*  MG  --  1.9   Liver Function Tests No results for input(s): "AST", "ALT", "ALKPHOS", "BILITOT", "PROT", "ALBUMIN" in the last 72 hours. No results for input(s): "LIPASE", "AMYLASE" in the last 72 hours. Cardiac Enzymes No results for input(s): "CKTOTAL", "CKMB", "CKMBINDEX", "TROPONINI" in the last 72 hours. BNP Invalid input(s): "POCBNP" D-Dimer No results for input(s): "DDIMER" in the last 72 hours. Hemoglobin A1C No results for input(s): "HGBA1C" in the last 72 hours. Fasting Lipid Panel No results for input(s): "CHOL", "HDL", "LDLCALC", "TRIG", "CHOLHDL", "LDLDIRECT" in the last 72 hours. Thyroid Function Tests No results for input(s): "TSH", "T4TOTAL", "T3FREE", "THYROIDAB" in the last 72 hours.  Invalid input(s): "FREET3" _____________  ECHOCARDIOGRAM LIMITED  Result Date: 05/25/2022    ECHOCARDIOGRAM LIMITED REPORT   Patient Name:   [name] Date of Exam: 05/25/2022  Medical Rec #:  05/27/2022           Height:       69.0 in Accession #:    [id number]          Weight:       200.0 lb Date of Birth:  05-26-1933           BSA:          2.066 m Patient Age:    89 years            BP:  178/87 mmHg Patient Gender: F                   HR:           80 bpm. Exam Location:  Inpatient Procedure: Cardiac Doppler, Color Doppler and Limited Echo Indications:    Aortic stenosis I35.0  History:        Patient has prior history of Echocardiogram examinations, most                 recent 03/11/2022. CAD; Risk Factors:Dyslipidemia. Thyroid                 disease.  Sonographer:    Leta Jungling RDCS Referring Phys: [id number] Zaiyden Strozier D Artrell Lawless IMPRESSIONS  1. Interventional TTE for TAVR placement.  2. Prior to procedure, calcified aortic valve. Trivial AI. Mean gradient 40 mmHG, Peak gradient 66 mm Hg. Normal SVI, DVI 0.19. EOA 0.79 cm2. Severe Aortic Stenosis.  3. Placement of a 26 mm Sapien valve. Very trivial PVL seen at the intravalvular fibrosa. Mean gradient 8 mm Hg, Peak gradient 12 mm Hg, DVI 0.66. EOA 2.80 cm2.  4. Left ventricular ejection fraction, by estimation, is 60 to 65%. The left ventricle has normal function. Hypertrophy seen in other studies is not well visualized in this limited study.  5. The mitral valve is degenerative. Trivial mitral valve regurgitation.  6. Aortic valve regurgitation is trivial. Procedure Date: 05/25/2022. Comparison(s): Successful TAVR placement. FINDINGS  Left Ventricle: Left ventricular ejection fraction, by estimation, is 60 to 65%. The left ventricle has normal function. Pericardium: There is no evidence of pericardial effusion. The pericardial effusion is lateral to the left ventricle. Mitral Valve: The mitral valve is degenerative in appearance. Trivial mitral valve regurgitation. Tricuspid Valve: Tricuspid valve regurgitation is mild. Aortic Valve: Aortic valve regurgitation is trivial. Aortic valve mean gradient measures 8.0 mmHg. Aortic  valve peak gradient measures 12.4 mmHg. Aortic valve area, by VTI measures 2.80 cm. There is a 26 mm Sapien prosthetic, stented (TAVR) valve present in the aortic position. LEFT VENTRICLE PLAX 2D LVOT diam:     2.30 cm LV SV:         118 LV SV Index:   57 LVOT Area:     4.15 cm  AORTIC VALVE AV Area (Vmax):    2.81 cm AV Area (Vmean):   3.11 cm AV Area (VTI):     2.80 cm AV Vmax:           176.00 cm/s AV Vmean:          129.000 cm/s AV VTI:            0.421 m AV Peak Grad:      12.4 mmHg AV Mean Grad:      8.0 mmHg LVOT Vmax:         119.00 cm/s LVOT Vmean:        96.600 cm/s LVOT VTI:          0.284 m LVOT/AV VTI ratio: 0.67  SHUNTS Systemic VTI:  0.28 m Systemic Diam: 2.30 cm Riley Lam MD Electronically signed by Riley Lam MD Signature Date/Time: 05/25/2022/4:22:58 PM    Final    Structural Heart Procedure  Result Date: 05/25/2022 See surgical note for result.  DG Chest 2 View  Result Date: 05/19/2022 CLINICAL DATA:  [id number].  Encounter for aspiration. EXAM: CHEST - 2 VIEW COMPARISON:  Portable chest 05/14/2022. FINDINGS: The patient is rotated to the left today. There is mild  cardiomegaly without evidence of CHF. There was tortuous and calcified with stable mediastinum. The lungs are generally clear apart from linear scarring or atelectasis in the lateral left base. No pleural effusion is seen. A moderate-sized hiatal hernia is again noted in the inferior mediastinum. Osteopenia and mild thoracic dextroscoliosis. IMPRESSION: No evidence of acute chest process. Stable chest with cardiomegaly chronic changes. Electronically Signed   By: Telford Nab M.D.   On: 05/19/2022 07:01   DG Chest 2 View  Result Date: 05/16/2022 CLINICAL DATA:  Preop assessment for TAVR, weakness. EXAM: CHEST - 2 VIEW COMPARISON:  02/12/2017. FINDINGS: Heart is enlarged the mediastinal contour is within normal limits. Lung volumes are low with mild atelectasis at the lung bases. No effusion or  pneumothorax. Degenerative changes are noted in the thoracic spine and glenohumeral joints bilaterally. IMPRESSION: 1. Mild atelectasis at the lung bases. 2. Cardiomegaly. Electronically Signed   By: Brett Fairy M.D.   On: 05/16/2022 03:45    Disposition   Pt is being discharged home today in good condition.  Follow-up Plans & Appointments     Follow-up Information     Eileen Stanford, PA-C. Go on 06/02/2022.   Specialties: Cardiology, Radiology Why: @ 2:30pm, please arrive at least 10 minutes early. Contact information: [address]  [place] [address] [phone]                  Discharge Medications   Allergies as of 05/26/2022       Reactions   Chlorhexidine Dermatitis, Rash        Medication List     TAKE these medications    acetaminophen 650 MG CR tablet Commonly known as: TYLENOL Take 650 mg by mouth every 8 (eight) hours as needed for pain.   acetaminophen 500 MG tablet Commonly known as: TYLENOL Take 500 mg by mouth every 6 (six) hours as needed for moderate pain.   aspirin EC 81 MG tablet Take 81 mg by mouth daily.   D3 High Potency 50 MCG (2000 UT) Caps Generic drug: Cholecalciferol Take 2,000 Units by mouth daily.   ibuprofen 200 MG tablet Commonly known as: ADVIL Take 600 mg by mouth every 8 (eight) hours as needed for moderate pain (shoulder pain).   levothyroxine 125 MCG tablet Commonly known as: SYNTHROID Take 125 mcg by mouth daily before breakfast.   mineral oil-hydrophilic petrolatum ointment Apply 1 Application topically as needed for irritation.   neomycin-bacitracin-polymyxin Oint Commonly known as: NEOSPORIN Apply 1 Application topically as needed for wound care.   nitroGLYCERIN 0.4 MG SL tablet Commonly known as: NITROSTAT Place 1 tablet (0.4 mg total) under the tongue every 5 (five) minutes as needed for chest pain.   Nystatin Powd Apply 1 Application topically as needed (yeast).    omeprazole 20 MG capsule Commonly known as: PRILOSEC Take 20 mg by mouth daily.   oxybutynin 15 MG 24 hr tablet Commonly known as: DITROPAN XL Take 15 mg by mouth daily.   PARoxetine 20 MG tablet Commonly known as: PAXIL Take 20 mg by mouth daily.            Outstanding Labs/Studies   none  Duration of Discharge Encounter   Greater than 30 minutes including physician time.  Mable Fill, PA-C 05/26/2022, 10:50 AM [phone]  I have personally seen and examined this patient. I agree with the assessment and plan as outlined above.  Doing well post TAVr. Groins stable. BP stable. New LBBB.  D/c home today with Zio cardiac monitor.   Lauree Chandler, MD 05/26/2022 11:05 AM

## 2022-05-25 NOTE — Anesthesia Postprocedure Evaluation (Signed)
Anesthesia Post Note  Patient: JOSIE BURLEIGH  Procedure(s) Performed: Transcatheter Aortic Valve Replacement, Transfemoral (Right) INTRAOPERATIVE TRANSTHORACIC ECHOCARDIOGRAM     Patient location during evaluation: Cath Lab Anesthesia Type: General Level of consciousness: awake and alert Pain management: pain level controlled Vital Signs Assessment: post-procedure vital signs reviewed and stable Respiratory status: spontaneous breathing, nonlabored ventilation, respiratory function stable and patient connected to nasal cannula oxygen Cardiovascular status: blood pressure returned to baseline and stable Postop Assessment: no apparent nausea or vomiting Anesthetic complications: no   There were no known notable events for this encounter.  Last Vitals:  Vitals:   05/25/22 1430 05/25/22 1500  BP: (!) 150/67 (!) 135/53  Pulse: 70 68  Resp: 16 19  Temp:    SpO2: 99% 97%    Last Pain:  Vitals:   05/25/22 1248  TempSrc: Oral  PainSc: 0-No pain                 Collene Schlichter

## 2022-05-25 NOTE — Progress Notes (Signed)
  Echocardiogram 2D Echocardiogram has been performed.  [name] 05/25/2022, 10:55 AM

## 2022-05-25 NOTE — Anesthesia Procedure Notes (Addendum)
Procedure Name: Intubation Date/Time: 05/25/2022 9:39 AM  Performed by: Amadeo Garnet, CRNAPre-anesthesia Checklist: Patient identified, Emergency Drugs available, Suction available and Patient being monitored Patient Re-evaluated:Patient Re-evaluated prior to induction Oxygen Delivery Method: Circle system utilized Preoxygenation: Pre-oxygenation with 100% oxygen Induction Type: IV induction Ventilation: Mask ventilation without difficulty and Oral airway inserted - appropriate to patient size Laryngoscope Size: Mac and 3 Grade View: Grade I Tube type: Oral Tube size: 7.0 mm Number of attempts: 1 Airway Equipment and Method: Stylet and Oral airway Placement Confirmation: ETT inserted through vocal cords under direct vision, positive ETCO2 and breath sounds checked- equal and bilateral Secured at: 21 cm Tube secured with: Tape Dental Injury: Teeth and Oropharynx as per pre-operative assessment

## 2022-05-25 NOTE — CV Procedure (Signed)
[facility]  TAVR OPERATIVE NOTE   Date of Procedure:  05/25/2022  Preoperative Diagnosis: Severe Aortic Stenosis   Postoperative Diagnosis: Same   Procedure:   Transcatheter Aortic Valve Replacement - Transfemoral Approach Edwards Sapien 3 THV (size 26 mm, model # [id number], serial # [id number])   Co-Surgeons:  Lauree Chandler, MD and Gaye Pollack, MD   Anesthesiologist:  Gifford Shave  Echocardiographer:  Gasper Sells  Pre-operative Echo Findings: Severe aortic stenosis Normal left ventricular systolic function  Post-operative Echo Findings: No paravalvular leak Normal left ventricular systolic function  BRIEF CLINICAL NOTE AND INDICATIONS FOR SURGERY  88 yo female with history of arthritis, CAD, chronic bronchitis, anxiety/depression, hyperlipidemia, hypothyroidism and severe aortic stenosis who is here today for TAVR. I saw her as a new consult in 2021 for further discussion regarding her aortic stenosis. She had been followed for moderate aortic stenosis by Dr. Domenic Polite. Echo in September 2021 with normal LV size and function with LVEF=70%. Moderate LVH. Severe AS with mean gradient 42 mmHg, peak gradient 72.9 mmHg, AVA 0.75 cm2.  I saw her in the office in September 2021 and we discussed potential TAVR. Cardiac cath October 2021 with mild CAD. Cardiac CTA with anatomy suitable for a 26 mm Edwards Sapien 3 valve. She appeared to have femoral anatomy suitable for transfemoral access for TAVR. Small pancreatic lesion. Follow up PET scan with findings consistent with stable well differentiated neuroendocrine tumor with no mets. She was seen by Dr. Cyndia Bent in the CT surgery office and based on her minimal symptoms and overall inactivity, TAVR workup was stopped. She is mostly confined to a wheelchair. She has had recent chest pain. This is central in her chest. This lasted about 30 minutes and occurred twice. Last episode was a week ago. She has had progressive fatigue and  weakness. She has not noticed dyspnea. Echo 03/11/22 with LVEF=65-70%. Severe AS with mean gradient 68 mmHg. No AVA reported.   During the course of the patient's preoperative work up they have been evaluated comprehensively by a multidisciplinary team of specialists coordinated through the [facility] in the [facility].  They have been demonstrated to suffer from symptomatic severe aortic stenosis as noted above. The patient has been counseled extensively as to the relative risks and benefits of all options for the treatment of severe aortic stenosis including long term medical therapy, conventional surgery for aortic valve replacement, and transcatheter aortic valve replacement.  The patient has been independently evaluated by Dr. Cyndia Bent with CT surgery and they are felt to be at high risk for conventional surgical aortic valve replacement. The surgeon indicated the patient would be a poor candidate for conventional surgery. Based upon review of all of the patient's preoperative diagnostic tests they are felt to be candidate for transcatheter aortic valve replacement using the transfemoral approach as an alternative to high risk conventional surgery.    Following the decision to proceed with transcatheter aortic valve replacement, a discussion has been held regarding what types of management strategies would be attempted intraoperatively in the event of life-threatening complications, including whether or not the patient would be considered a candidate for the use of cardiopulmonary bypass and/or conversion to open sternotomy for attempted surgical intervention.  The patient has been advised of a variety of complications that might develop peculiar to this approach including but not limited to risks of death, stroke, paravalvular leak, aortic dissection or other major vascular complications, aortic annulus rupture, device embolization,  cardiac rupture or  perforation, acute myocardial infarction, arrhythmia, heart block or bradycardia requiring permanent pacemaker placement, congestive heart failure, respiratory failure, renal failure, pneumonia, infection, other late complications related to structural valve deterioration or migration, or other complications that might ultimately cause a temporary or permanent loss of functional independence or other long term morbidity.  The patient provides full informed consent for the procedure as described and all questions were answered preoperatively.    DETAILS OF THE OPERATIVE PROCEDURE  PREPARATION:   The patient is brought to the operating room on the above mentioned date and central monitoring was established by the anesthesia team including placement of a radial arterial line. The patient is placed in the supine position on the operating table.  Intravenous antibiotics are administered. General anesthesia is used.   Baseline transthoracic echocardiogram was performed. [name] chest, abdomen, both groins, and both lower extremities are prepared and draped in a sterile manner. A time out procedure is performed.   PERIPHERAL ACCESS:   Using the modified Seldinger technique, femoral arterial and venous access were obtained with placement of a 6 Fr sheath in the artery and a 7 Fr sheath in the vein on the left side using u/s guidance.  A pigtail diagnostic catheter was passed through the femoral arterial sheath under fluoroscopic guidance into the aortic root.  A temporary transvenous pacemaker catheter was passed through the femoral venous sheath under fluoroscopic guidance into the right ventricle.  The pacemaker was tested to ensure stable lead placement and pacemaker capture. Aortic root angiography was performed in order to determine the optimal angiographic angle for valve deployment.  TRANSFEMORAL ACCESS:  A micropuncture kit was used to gain access to the right femoral artery using u/s guidance.  Position confirmed with angiography. Pre-closure with double ProGlide closure devices. The patient was heparinized systemically and ACT verified > 250 seconds.    A 14 Fr transfemoral E-sheath was introduced into the right femoral artery after progressively dilating over an Amplatz superstiff wire. An AL-1 catheter was used to direct a straight-tip exchange length wire across the native aortic valve into the left ventricle. This was exchanged out for a pigtail catheter and position was confirmed in the LV apex. Simultaneous LV and Ao pressures were recorded.  The pigtail catheter was then exchanged for an Amplatz Extra-stiff wire in the LV apex.   TRANSCATHETER HEART VALVE DEPLOYMENT:  An Edwards Sapien 3 THV (size 26 mm) was prepared and crimped per manufacturer's guidelines, and the proper orientation of the valve is confirmed on the [employer] delivery system. The valve was advanced through the introducer sheath using normal technique until in an appropriate position in the abdominal aorta beyond the sheath tip. The balloon was then retracted and using the fine-tuning wheel was centered on the valve. The valve was then advanced across the aortic arch using appropriate flexion of the catheter. The valve was carefully positioned across the aortic valve annulus. The Commander catheter was retracted using normal technique. Once final position of the valve has been confirmed by angiographic assessment, the valve is deployed while temporarily holding ventilation and during rapid ventricular pacing to maintain systolic blood pressure < 50 mmHg and pulse pressure < 10 mmHg. The balloon inflation is held for >3 seconds after reaching full deployment volume. Once the balloon has fully deflated the balloon is retracted into the ascending aorta and valve function is assessed using TTE. There is felt to be trivial paravalvular leak and no central aortic insufficiency.  The  patient's hemodynamic recovery following  valve deployment is good.  The deployment balloon and guidewire are both removed. Echo demostrated acceptable post-procedural gradients, stable mitral valve function, and trivial AI.   PROCEDURE COMPLETION:  The sheath was then removed and closure devices were completed. Protamine was administered once femoral arterial repair was complete. The temporary pacemaker, pigtail catheters and femoral sheaths were removed with a Mynx closure device placed in the artery and manual pressure used for venous hemostasis.    The patient tolerated the procedure well and is transported to the surgical intensive care in stable condition. There were no immediate intraoperative complications. All sponge instrument and needle counts are verified correct at completion of the operation.   No blood products were administered during the operation.  The patient received a total of 40 mL of intravenous contrast during the procedure.  LVEDP: 15 mmHg  Lauree Chandler MD 05/25/2022 11:20 AM

## 2022-05-25 NOTE — Op Note (Signed)
[facility]   MULTIDISCIPLINARY HEART VALVE TEAM   TAVR OPERATIVE NOTE   Date of Procedure:  05/25/2022  Preoperative Diagnosis: Severe Aortic Stenosis   Postoperative Diagnosis: Same   Procedure:   Transcatheter Aortic Valve Replacement - Percutaneous Right Transfemoral Approach  Edwards Sapien 3 Ultra Resilia THV (size 26 mm, model # [id number], serial # [id number])   Co-Surgeons: Gaye Pollack, MD and Lauree Chandler, MD   Anesthesiologist:  S. Gifford Shave, MD  Echocardiographer:  Osborne Oman, MD  Pre-operative Echo Findings: Severe aortic stenosis Normal left ventricular systolic function  Post-operative Echo Findings: No paravalvular leak Normal left ventricular systolic function   BRIEF CLINICAL NOTE AND INDICATIONS FOR SURGERY  This 88 year old woman has stage D, critical aortic stenosis with NYHA class II symptoms of fatigue and generalized weakness as well as substernal chest discomfort.  I have personally reviewed her 2D echocardiogram, cardiac catheterization, and CTA studies.  Her echo shows a severely calcified aortic valve with restricted leaflet mobility.  The mean gradient was measured at 54 mmHg with a peak gradient of 104 mmHg consistent with critical aortic stenosis.  Left ventricular ejection fraction is normal.  Cardiac catheterization in October 2021 showed mild nonobstructive coronary disease.  The mean gradient across her aortic valve at that time was 48 mmHg.  I agree that aortic valve replacement is indicated in this patient for relief of her worsening symptoms and to prevent left ventricular dysfunction and death.  Given her advanced age and limited mobility due to being wheelchair-bound from severe lower extremity degenerative arthritis I think that transcatheter aortic valve replacement would be the only option for treating her.  Her gated cardiac CTA shows anatomy suitable for TAVR using a SAPIEN 3 valve.  Her abdominal and pelvic CTA  shows adequate pelvic vascular anatomy to allow transfemoral insertion.   The patient and her daughter were counseled at length regarding treatment alternatives for management of severe symptomatic aortic stenosis. The risks and benefits of surgical intervention has been discussed in detail. Long-term prognosis with medical therapy was discussed. Alternative approaches such as conventional surgical aortic valve replacement, transcatheter aortic valve replacement, and palliative medical therapy were compared and contrasted at length. This discussion was placed in the context of the patient's own specific clinical presentation and past medical history. All of their questions have been addressed.    Following the decision to proceed with transcatheter aortic valve replacement, a discussion was held regarding what types of management strategies would be attempted intraoperatively in the event of life-threatening complications, including whether or not the patient would be considered a candidate for the use of cardiopulmonary bypass and/or conversion to open sternotomy for attempted surgical intervention.  I do not think she is a candidate for emergent sternotomy to manage any intraoperative complications or cardiopulmonary bypass. The patient has been advised of a variety of complications that might develop including but not limited to risks of death, stroke, paravalvular leak, aortic dissection or other major vascular complications, aortic annulus rupture, device embolization, cardiac rupture or perforation, mitral regurgitation, acute myocardial infarction, arrhythmia, heart block or bradycardia requiring permanent pacemaker placement, congestive heart failure, respiratory failure, renal failure, pneumonia, infection, other late complications related to structural valve deterioration or migration, or other complications that might ultimately cause a temporary or permanent loss of functional independence or other  long term morbidity. The patient provides full informed consent for the procedure as described and all questions were answered.     DETAILS OF THE OPERATIVE  PROCEDURE  PREPARATION:    The patient was brought to the operating room on the above mentioned date and appropriate monitoring was established by the anesthesia team. The patient was placed in the supine position on the operating table.  Intravenous antibiotics were administered.  General endotracheal anesthesia was induced uneventfully because she had contractures in her lower extremities from being wheel chair bound and could not straighten out her legs and could not keep them still.  Baseline transthoracic echocardiogram was performed. [name] abdomen and both groins were prepped and draped in a sterile manner. A time out procedure was performed.   PERIPHERAL ACCESS:    Using the modified Seldinger technique, femoral arterial and venous access was obtained with placement of 6 Fr sheaths on the left side.  A pigtail diagnostic catheter was passed through the left arterial sheath under fluoroscopic guidance into the aortic root.  A temporary transvenous pacemaker catheter was passed through the left femoral venous sheath under fluoroscopic guidance into the right ventricle.  The pacemaker was tested to ensure stable lead placement and pacemaker capture. Aortic root angiography was performed in order to determine the optimal angiographic angle for valve deployment.   TRANSFEMORAL ACCESS:   Percutaneous transfemoral access and sheath placement was performed using ultrasound guidance.  The right common femoral artery was cannulated using a micropuncture needle and appropriate location was verified using hand injection angiogram.  A pair of Abbott Perclose percutaneous closure devices were placed and a 6 French sheath replaced into the femoral artery.  The patient was heparinized systemically and ACT verified > 250 seconds.    A 14 Fr  transfemoral E-sheath was introduced into the right common femoral artery after progressively dilating over an Amplatz superstiff wire. An AL-1 catheter was used to direct a straight-tip exchange length wire across the native aortic valve into the left ventricle. This was exchanged out for a pigtail catheter and position was confirmed in the LV apex. Simultaneous LV and Ao pressures were recorded.  The pigtail catheter was exchanged for a Safari wire in the LV apex.   BALLOON AORTIC VALVULOPLASTY:   Not performed.    TRANSCATHETER HEART VALVE DEPLOYMENT:   An Edwards Sapien 3 Ultra transcatheter heart valve (size 26 mm) was prepared and crimped per manufacturer's guidelines, and the proper orientation of the valve is confirmed on the [employer] delivery system. The valve was advanced through the introducer sheath using normal technique until in an appropriate position in the abdominal aorta beyond the sheath tip. The balloon was then retracted and using the fine-tuning wheel was centered on the valve. The valve was then advanced across the aortic arch using appropriate flexion of the catheter. The valve was carefully positioned across the aortic valve annulus. The Commander catheter was retracted using normal technique. Once final position of the valve has been confirmed by angiographic assessment, the valve is deployed during rapid ventricular pacing to maintain systolic blood pressure < 50 mmHg and pulse pressure < 10 mmHg. The balloon inflation is held for >3 seconds after reaching full deployment volume. Once the balloon has fully deflated the balloon is retracted into the ascending aorta and valve function is assessed using echocardiography. There is felt to be no paravalvular leak and no central aortic insufficiency.  [name] hemodynamic recovery following valve deployment is good.  The deployment balloon and guidewire are both removed.    PROCEDURE COMPLETION:   The sheath was  removed and femoral artery closure performed.  Protamine was administered  once femoral arterial repair was complete. The temporary pacemaker, pigtail catheter and femoral sheaths were removed with manual pressure used for venous hemostasis.  A Mynx femoral closure device was utilized following removal of the diagnostic sheath in the left femoral artery.  The patient tolerated the procedure well and is transported to the cath lab recovery area in stable condition. There were no immediate intraoperative complications. All sponge instrument and needle counts are verified correct at completion of the operation.   No blood products were administered during the operation.  The patient received a total of 40 mL of intravenous contrast during the procedure.   [name], MD 05/25/2022

## 2022-05-25 NOTE — Discharge Instructions (Signed)

## 2022-05-25 NOTE — Progress Notes (Signed)
Mobility Specialist: Progress Note   05/25/22 1643  Mobility  Activity  (Wheelchair mobility)  Level of Assistance Standby assist, set-up cues, supervision of patient - no hands on  Assistive Device Wheelchair  Distance Ambulated (ft) 160 ft  Activity Response Tolerated well  $Mobility charge 1 Mobility   Pre-Mobility: 68 HR, 100% SpO2 Post-Mobility: 71 HR, 98% SpO2  Pt received in the bed and agreeable to mobility. Standby assist to transfer to the wheelchair. No c/o throughout session. Pt left in the wheelchair with call bell in reach and family present in the room.   [facility] [place] Mobility Specialist Mobility Specialist 4 East: [phone]

## 2022-05-25 NOTE — Transfer of Care (Signed)
Immediate Anesthesia Transfer of Care Note  Patient: [name]  Procedure(s) Performed: Transcatheter Aortic Valve Replacement, Transfemoral (Right) INTRAOPERATIVE TRANSTHORACIC ECHOCARDIOGRAM  Patient Location: Cath Lab  Anesthesia Type:General  Level of Consciousness: awake, alert  and oriented  Airway & Oxygen Therapy: Patient Spontanous Breathing and Patient connected to nasal cannula oxygen  Post-op Assessment: Report given to RN, Post -op Vital signs reviewed and stable and Patient moving all extremities  Post vital signs: Reviewed and stable  Last Vitals:  Vitals Value Taken Time  BP 171/51 05/25/22 1123  Temp    Pulse 77 05/25/22 1126  Resp 16 05/25/22 1126  SpO2 100 % 05/25/22 1126  Vitals shown include unvalidated device data.  Last Pain:  Vitals:   05/25/22 0942  TempSrc:   PainSc: 7          Complications: There were no known notable events for this encounter.

## 2022-05-25 NOTE — Progress Notes (Signed)
  [facility]   MULTIDISCIPLINARY HEART VALVE TEAM   Patient noted to have a mild rash at chlorhexidine site. Given Benadryl 25mg  per anesthesia. Added to allergy list.   NP-C Structural Heart Team  Pager: [phone] Phone: [phone]

## 2022-05-25 NOTE — Progress Notes (Signed)
Mobility Specialist: Progress Note   05/25/22 1704  Mobility  Activity Transferred from chair to bed  Level of Assistance Maximum assist, patient does 25-49%  Assistive Device Other (Comment) (HHA)  Activity Response Tolerated well  $Mobility charge 1 Mobility   Assisted pt back to bed per request. MaxA HHA for squat pivot to the bed. Pt back in the bed with call bell in reach.   [facility] Xue Low Mobility Specialist Mobility Specialist 4 East: [phone]

## 2022-05-25 NOTE — Anesthesia Procedure Notes (Signed)
Arterial Line Insertion Start/End8/29/2023 8:20 AM, 05/25/2022 8:30 AM Performed by: Colon Flattery, CRNA, CRNA  Patient location: Pre-op. Preanesthetic checklist: patient identified, IV checked, site marked, risks and benefits discussed, surgical consent, monitors and equipment checked, pre-op evaluation, timeout performed and anesthesia consent Lidocaine 1% used for infiltration Left, radial was placed Catheter size: 20 G Hand hygiene performed  and maximum sterile barriers used   Attempts: 1 Procedure performed without using ultrasound guided technique. Following insertion, dressing applied and Biopatch. Post procedure assessment: normal  Patient tolerated the procedure well with no immediate complications.

## 2022-05-26 ENCOUNTER — Inpatient Hospital Stay (HOSPITAL_BASED_OUTPATIENT_CLINIC_OR_DEPARTMENT_OTHER)
Admission: RE | Admit: 2022-05-26 | Discharge: 2022-05-26 | Disposition: A | Discharge status: Home / Self Care | Payer: Medicare Other | Source: Home / Self Care | Attending: Physician Assistant | Admitting: Physician Assistant

## 2022-05-26 ENCOUNTER — Inpatient Hospital Stay (HOSPITAL_COMMUNITY): Payer: Medicare Other

## 2022-05-26 DIAGNOSIS — Z952 Presence of prosthetic heart valve: Secondary | ICD-10-CM

## 2022-05-26 DIAGNOSIS — I350 Nonrheumatic aortic (valve) stenosis: Secondary | ICD-10-CM

## 2022-05-26 DIAGNOSIS — Z006 Encounter for examination for normal comparison and control in clinical research program: Secondary | ICD-10-CM | POA: Diagnosis not present

## 2022-05-26 DIAGNOSIS — R54 Age-related physical debility: Secondary | ICD-10-CM | POA: Diagnosis not present

## 2022-05-26 DIAGNOSIS — I447 Left bundle-branch block, unspecified: Secondary | ICD-10-CM | POA: Diagnosis not present

## 2022-05-26 DIAGNOSIS — E039 Hypothyroidism, unspecified: Secondary | ICD-10-CM | POA: Diagnosis not present

## 2022-05-26 LAB — ECHOCARDIOGRAM COMPLETE
AR max vel: 2.79 cm2
AV Area VTI: 2.6 cm2
AV Area mean vel: 2.58 cm2
AV Mean grad: 20 mmHg
AV Peak grad: 39.6 mmHg
Ao pk vel: 3.15 m/s
Area-P 1/2: 2.32 cm2
Calc EF: 71 %
Height: 69 in
S' Lateral: 1.9 cm
Single Plane A2C EF: 70 %
Single Plane A4C EF: 71.1 %
Weight: 3245.17 oz

## 2022-05-26 LAB — BASIC METABOLIC PANEL
Anion gap: 7 (ref 5–15)
BUN: 14 mg/dL (ref 8–23)
CO2: 28 mmol/L (ref 22–32)
Calcium: 7.9 mg/dL — ABNORMAL LOW (ref 8.9–10.3)
Chloride: 104 mmol/L (ref 98–111)
Creatinine, Ser: 0.95 mg/dL (ref 0.44–1.00)
GFR, Estimated: 58 mL/min — ABNORMAL LOW (ref >60)
Glucose, Bld: 113 mg/dL — ABNORMAL HIGH (ref 70–99)
Potassium: 3.7 mmol/L (ref 3.5–5.1)
Sodium: 139 mmol/L (ref 135–145)

## 2022-05-26 LAB — CBC
HCT: 35.7 % — ABNORMAL LOW (ref 36.0–46.0)
Hemoglobin: 11.2 g/dL — ABNORMAL LOW (ref 12.0–15.0)
MCH: 27.9 pg (ref 26.0–34.0)
MCHC: 31.4 g/dL (ref 30.0–36.0)
MCV: 89 fL (ref 80.0–100.0)
Platelets: 254 10*3/uL (ref 150–400)
RBC: 4.01 MIL/uL (ref 3.87–5.11)
RDW: 14.2 % (ref 11.5–15.5)
WBC: 9.9 10*3/uL (ref 4.0–10.5)
nRBC: 0 % (ref 0.0–0.2)

## 2022-05-26 LAB — MAGNESIUM: Magnesium: 1.9 mg/dL (ref 1.7–2.4)

## 2022-05-26 NOTE — Progress Notes (Signed)
Discharge education completed with patient; educated on incision site care, exercise guidelines, nutrition, fall prevention, orientation to cardiac rehab phase 11 at [facility].  [id number] Newell Coral RN 8/30/202311:31 AM

## 2022-05-26 NOTE — Progress Notes (Signed)
Pt discharged home, per order. Pt received discharge instructions and left with all belongings.

## 2022-05-26 NOTE — TOC Transition Note (Signed)
Transition of Care (TOC) - CM/SW Discharge Note Donn Pierini RN, BSN Transitions of Care Unit 4E- RN Case Manager See Treatment Team for direct phone #    Patient Details  Name: [name] MRN: [id number] Date of Birth: August 24, 1933  Transition of Care [facility]) CM/SW Contact:  Darrold Span, RN Phone Number: 05/26/2022, 11:22 AM   Clinical Narrative:    Pt stable for transition home s/p TAVR.  Transition of Care Department [facility]) has reviewed patient and no TOC needs have been identified at this time.  PCP- Assunta Found Transportation- family Medication barriers-  none identified    Final next level of care: Home/Self Care Barriers to Discharge: No Barriers Identified   Patient Goals and CMS Choice Patient states their goals for this hospitalization and ongoing recovery are:: return home   Choice offered to / list presented to : NA  Discharge Placement               Home        Discharge Plan and Services                DME Arranged: N/A DME Agency: NA       HH Arranged: NA HH Agency: NA        Social Determinants of Health (SDOH) Interventions     Readmission Risk Interventions    05/26/2022   11:22 AM  Readmission Risk Prevention Plan  Post Dischage Appt Complete  Medication Screening Complete  Transportation Screening Complete

## 2022-05-26 NOTE — Progress Notes (Signed)
Echocardiogram 2D Echocardiogram has been performed.  [name] 05/26/2022, 10:49 AM

## 2022-05-27 ENCOUNTER — Telehealth: Payer: Self-pay | Admitting: Physician Assistant

## 2022-05-27 LAB — TYPE AND SCREEN
ABO/RH(D): O POS
Antibody Screen: NEGATIVE
Unit division: 0
Unit division: 0

## 2022-05-27 LAB — BPAM RBC
Blood Product Expiration Date: 202309232359
Blood Product Expiration Date: 202310012359
ISSUE DATE / TIME: 202308251619
Unit Type and Rh: 5100
Unit Type and Rh: 5100

## 2022-05-27 NOTE — Telephone Encounter (Signed)
  [facility]   MULTIDISCIPLINARY HEART VALVE TEAM   Patient contacted regarding discharge from [facility] on 8/30  Patient understands to follow up with a structural heart APP on 9/6 at [address] [facility].  Patient understands discharge instructions? yes Patient understands medications and regimen? yes Patient understands to bring all medications to this visit? Yes   [name] wanted to know how to order a positioning pad to help move her mom in bed. I found some good option for her on [employer] and [email or web address].  Cline Crock PA-C  [facility]

## 2022-06-01 ENCOUNTER — Ambulatory Visit: Payer: Medicare Other | Admitting: Cardiology

## 2022-06-01 NOTE — Progress Notes (Unsigned)
[facility]   MULTIDISCIPLINARY HEART VALVE CLINIC                                     Cardiology Office Note:    Date:  06/02/2022   ID:  MARICIA SCOTTI, DOB 29-Aug-1933, MRN [id number]  PCP:  Assunta Found, MD  [facility] Cardiologist:  Nona Dell, MD / Dr. Aundra Dubin & Dr. Renato Battles (TAVR) [facility] Electrophysiologist:  None   Referring MD: Assunta Found, MD   [facility] s/p TAVR  History of Present Illness:    ALINDA EGOLF is a 88 y.o. female with a hx of arthritis, CAD, chronic bronchitis, anxiety/depression, HLD, hypothyroidism, wheelchair bound and severe aortic stenosis s/p TAVR (05/25/22) who presents to clinic for follow up.  The structural heart team initially evaluated her on 08/27/2020 for consideration of TAVR. L/RHC in 06/2020 showed mild non-obstructive CAD. At that time she looked very frail and it was not clear that she was having significant symptoms and it was decided to hold off on TAVR. We decided to continue following her aortic stenosis.  Her most recent echocardiogram on 03/11/2022 showed an increase in the mean gradient to 68 mmHg with a peak gradient of 104 mmHg.  Left ventricular ejection fraction was 65 to 70% with severe LVH. She reported having recent episodes of substernal chest discomfort in the center of her chest that last about 30 minutes as well as progressive fatigue and generalized weakness. She reported living alone and taking care of all of her own ADLs.    She was evaluated by the multidisciplinary valve team and underwent successful TAVR with a 26 mm Edwards Sapien 3 Ultra Resilia THV via the TF approach on 05/25/22. Post operative echo showed EF 70%, severe asymmetric LVH, chordal SAM, normally functioning TAVR with a mean gradient of 20 mmHg and no PVL. She was discharged on home aspirin. Given new LBBB, she was discharged with a Zio AT.   Today the patient presents to clinic for follow up. Here with daughter. She had one  episode of CP that lasted for a few minutes while sitting in her chair. Did not try a SL NTG. No SOB. She has had worsening LE edema but no orthopnea or PND. No dizziness or syncope. No blood in stool or urine. No palpitations.      Past Medical History:  Diagnosis Date   Anxiety    Arthritis    CAD (coronary artery disease)    Mild at cardiac catheterization 2018   Chronic bronchitis    Constipation    Depression    Hyperlipidemia    Hypothyroidism    S/P TAVR (transcatheter aortic valve replacement) 05/25/2022   89mm S3UR via TF approach with Dr. Clifton James and Dr. Laneta Simmers   Severe aortic stenosis     Past Surgical History:  Procedure Laterality Date   BUNIONECTOMY Left 1975   CARDIAC CATHETERIZATION  07/04/2020   CHOLECYSTECTOMY N/A 02/15/2017   Procedure: LAPAROSCOPIC CHOLECYSTECTOMY;  Surgeon: Axel Filler, MD;  Location: [facility] OR;  Service: General;  Laterality: N/A;   CORONARY ANGIOGRAPHY N/A 02/14/2017   Procedure: Coronary Angiography;  Surgeon: Yvonne Kendall, MD;  Location: [facility] INVASIVE CV LAB;  Service: Cardiovascular;  Laterality: N/A;   EYE SURGERY  2009   bilateral   HEMATOMA EVACUATION  06/04/2011   Procedure: EVACUATION HEMATOMA;  Surgeon: Fuller Canada, MD;  Location: [facility] ORS;  Service: Orthopedics;  Laterality: Right;  evacuation of seroma   INTRAOPERATIVE TRANSTHORACIC ECHOCARDIOGRAM N/A 05/25/2022   Procedure: INTRAOPERATIVE TRANSTHORACIC ECHOCARDIOGRAM;  Surgeon: Burnell Blanks, MD;  Location: [place] CV LAB;  Service: Open Heart Surgery;  Laterality: N/A;   JOINT REPLACEMENT  05/2010   left total knee Dr. Aline Brochure   PATELLAR TENDON REPAIR  06/04/2011   Procedure: PATELLA TENDON REPAIR;  Surgeon: Arther Abbott, MD;  Location: [facility] ORS;  Service: Orthopedics;  Laterality: Right;   PATELLAR TENDON REPAIR  08/30/2011   Procedure: PATELLA TENDON REPAIR;  Surgeon: Arther Abbott, MD;  Location: [facility] ORS;  Service: Orthopedics;  Laterality: Right;   Allograft Reconstrucation Right Patella Tendon   RIGHT/LEFT HEART CATH AND CORONARY ANGIOGRAPHY N/A 07/04/2020   Procedure: RIGHT/LEFT HEART CATH AND CORONARY ANGIOGRAPHY;  Surgeon: Burnell Blanks, MD;  Location: [place] CV LAB;  Service: Cardiovascular;  Laterality: N/A;   TOTAL HIP ARTHROPLASTY Left 1998   TOTAL KNEE ARTHROPLASTY  05/03/2011   Procedure: TOTAL KNEE ARTHROPLASTY;  Surgeon: Arther Abbott, MD;  Location: [facility];  Service: Orthopedics;  Laterality: Right;  With DePuy   TOTAL THYROIDECTOMY  11/2009   Dr. Benjamine Mola @ [facility]   TRANSCATHETER AORTIC VALVE REPLACEMENT, TRANSFEMORAL Right 05/25/2022   Procedure: Transcatheter Aortic Valve Replacement, Transfemoral;  Surgeon: Burnell Blanks, MD;  Location: [place] CV LAB;  Service: Open Heart Surgery;  Laterality: Right;   TUBAL LIGATION      Current Medications: Current Meds  Medication Sig   acetaminophen (TYLENOL) 500 MG tablet Take 500 mg by mouth every 6 (six) hours as needed for moderate pain.   acetaminophen (TYLENOL) 650 MG CR tablet Take 650 mg by mouth every 8 (eight) hours as needed for pain.   aspirin EC 81 MG tablet Take 81 mg by mouth daily.   Cholecalciferol (D3 HIGH POTENCY) 50 MCG (2000 UT) CAPS Take 2,000 Units by mouth daily.   furosemide (LASIX) 40 MG tablet Take 1 tablet (40 mg total) by mouth daily.   ibuprofen (ADVIL,MOTRIN) 200 MG tablet Take 600 mg by mouth every 8 (eight) hours as needed for moderate pain (shoulder pain).   levothyroxine (SYNTHROID) 125 MCG tablet Take 125 mcg by mouth daily before breakfast.   mineral oil-hydrophilic petrolatum (AQUAPHOR) ointment Apply 1 Application topically as needed for irritation.   neomycin-bacitracin-polymyxin (NEOSPORIN) OINT Apply 1 Application topically as needed for wound care.   nitroGLYCERIN (NITROSTAT) 0.4 MG SL tablet Place 1 tablet (0.4 mg total) under the tongue every 5 (five) minutes as needed for chest pain.   Nystatin POWD Apply 1  Application topically as needed (yeast).   omeprazole (PRILOSEC) 20 MG capsule Take 20 mg by mouth daily.   oxybutynin (DITROPAN XL) 15 MG 24 hr tablet Take 15 mg by mouth daily.   PARoxetine (PAXIL) 20 MG tablet Take 20 mg by mouth daily.   potassium chloride SA (KLOR-CON M) 20 MEQ tablet Take 1 tablet (20 mEq total) by mouth daily.     Allergies:   Chlorhexidine   Social History   Socioeconomic History   Marital status: Widowed    Spouse name: Not on file   Number of children: 2   Years of education: Not on file   Highest education level: Not on file  Occupational History   Occupation: Retired-Office work  Tobacco Use   Smoking status: Former    Types: Cigarettes    Quit date: 09/27/1994    Years since quitting: 27.6   Smokeless  tobacco: Never  Vaping Use   Vaping Use: Never used  Substance and Sexual Activity   Alcohol use: No   Drug use: No   Sexual activity: Not Currently  Other Topics Concern   Not on file  Social History Narrative   ** Merged History Encounter **       Social Determinants of Health   Financial Resource Strain: Not on file  Food Insecurity: Not on file  Transportation Needs: Not on file  Physical Activity: Not on file  Stress: Not on file  Social Connections: Not on file     Family History: [name] family history includes Blindness in her sister; Colon cancer in her mother. There is no history of Anesthesia problems, Hypotension, Malignant hyperthermia, or Pseudochol deficiency.  ROS:   Please see the history of present illness.    All other systems reviewed and are negative.  EKGs/Labs/Other Studies Reviewed:    The following studies were reviewed today:  TAVR OPERATIVE NOTE     Date of Procedure:                05/25/2022   Preoperative Diagnosis:      Severe Aortic Stenosis    Postoperative Diagnosis:    Same    Procedure:        Transcatheter Aortic Valve Replacement - Transfemoral Approach     Edwards Sapien 3 THV  (size 26 mm, model # [id number], serial # [id number])              Co-Surgeons:                        Lauree Chandler, MD and Gaye Pollack, MD    Anesthesiologist:                  Gifford Shave   Echocardiographer:              Gasper Sells   Pre-operative Echo Findings: Severe aortic stenosis Normal left ventricular systolic function   Post-operative Echo Findings: No paravalvular leak Normal left ventricular systolic function _____________     Echo 05/26/22: IMPRESSIONS   1. Left ventricular ejection fraction, by estimation, is 70 to 75%. The  left ventricle has hyperdynamic function. The left ventricle has no  regional wall motion abnormalities. There is severe asymmetric left  ventricular hypertrophy. Left ventricular  diastolic parameters are consistent with Grade II diastolic dysfunction  (pseudonormalization). Elevated left atrial pressure. There is Chordal SAM  withan LVOT gradient of 44 mm Hg at peak. There is no significant  secondary MR.   2. The aortic valve has been repaired/replaced. Aortic valve  regurgitation is not visualized. There is a 26 mm Edwards Sapien  prosthetic (TAVR) valve present in the aortic position. Procedure Date:  05/25/22. EFfectice orifice area, by VTI measures 2.60  cm. Aortic valve mean gradient measures 20.0 mmHg. Peak gradient 43 mm  Hg, DVI is normal, AVA 2.67 cm2.   3. Right ventricular systolic function is normal. The right ventricular  size is normal. There is moderately elevated pulmonary artery systolic  pressure. The estimated right ventricular systolic pressure is [id number] mmHg.   4. Left atrial size was moderately dilated.   5. The mitral valve is normal in structure. Trivial mitral valve  regurgitation. No evidence of mitral stenosis. Moderate mitral annular  calcification.   6. The inferior vena cava is normal in size with greater than 50%  respiratory variability, suggesting right atrial pressure of  3 mmHg.   Comparison(s):  Echo findings are consistent with normal structure and  function of the aortic valve prosthesis. Increased in all gradients (LVOT,  aortic valve) may be partialy related to hyperdynamic function. This is  not consistent with Neo-LVOT  pathology seen in the suicide left ventricle phenotype.   EKG:  EKG is ordered today.  The ekg ordered today demonstrates sinus with LBBB, HR 61  Recent Labs: 05/18/2022: ALT 15; B Natriuretic Peptide 415.3 05/26/2022: BUN 14; Creatinine, Ser 0.95; Hemoglobin 11.2; Magnesium 1.9; Platelets 254; Potassium 3.7; Sodium 139  Recent Lipid Panel No results found for: "CHOL", "TRIG", "HDL", "CHOLHDL", "VLDL", "LDLCALC", "LDLDIRECT"   Risk Assessment/Calculations:       Physical Exam:    VS:  BP 130/76   Pulse 61   Ht 5' 9.5" (1.765 m)   Wt 200 lb (90.7 kg) Comment: per patient unable to stand wheelchair  SpO2 98%   BMI 29.11 kg/m     Wt Readings from Last 3 Encounters:  06/02/22 200 lb (90.7 kg)  05/26/22 202 lb 13.2 oz (92 kg)  05/18/22 200 lb (90.7 kg)     GEN:  Well nourished, well developed in no acute distress. Elderly and frail. In wheelchair HEENT: Normal NECK: No JVD LYMPHATICS: No lymphadenopathy CARDIAC: RRR, no murmurs, rubs, gallops RESPIRATORY:  Clear to auscultation without rales, wheezing or rhonchi  ABDOMEN: Soft, non-tender, non-distended MUSCULOSKELETAL:  2+ pitting edema to knees. Redness in feet and ankles. No warmth; No deformity  SKIN: Warm and dry NEUROLOGIC:  Alert and oriented x 3 PSYCHIATRIC:  Normal affect   ASSESSMENT:    1. S/P TAVR (transcatheter aortic valve replacement)   2. LBBB (left bundle branch block)   3. Lower extremity edema   4. Essential hypertension   5. Renal lesion   6. Chest pain of uncertain etiology    PLAN:    In order of problems listed above:  Severe AS s/p TAVR: doing well 1 week out from TAVR. ECG with no HAVB. Groin sites are stable. Continue on a baby aspirin. SBE prophylaxis  discussed; the patient is edentulous and does not go to the dentist. I will see her back for 1 month follow up and echo   New LBBB: stable by ECG. Wearing Zio AT with no HAVB alerts   LE edema: start Lasix 40mg  daily and Kdur daily. Check BMET next visit   HTN: BP well controlled. No changes made   Renal lesion: pre TAVR CT showed a subcentimeter indeterminate upper pole left renal lesion is slightly increased in size when compared with prior exam. Recommend renal protocol CT or MRI for further evaluation. I discussed with patient and we are going to hold off on further imaging at this time given age.    Chest pain: had one episode of chest pain. Non exertional and sounds non cardiac. LHC in 2021 with nonobst CAD. I asked her to try a SL NTG if this returns to see if it helps. If it does help and she continues to have chest pain we can try long acting nitrates     Cardiac Rehabilitation Eligibility Assessment  The patient is ready to start cardiac rehabilitation from a cardiac standpoint.         Medication Adjustments/Labs and Tests Ordered: Current medicines are reviewed at length with the patient today.  Concerns regarding medicines are outlined above.  Orders Placed This Encounter  Procedures   EKG 12-Lead   Meds ordered this  encounter  Medications   furosemide (LASIX) 40 MG tablet    Sig: Take 1 tablet (40 mg total) by mouth daily.    Dispense:  90 tablet    Refill:  3   potassium chloride SA (KLOR-CON M) 20 MEQ tablet    Sig: Take 1 tablet (20 mEq total) by mouth daily.    Dispense:  90 tablet    Refill:  3    Patient Instructions  Medication Instructions:  Your physician has recommended you make the following change in your medication:  START LASIX 40 MG DAILY. START POTASSIUM 20 MEQ DAILY.  *If you need a refill on your cardiac medications before your next appointment, please call your pharmacy*   Lab Work: NONE If you have labs (blood work) drawn today  and your tests are completely normal, you will receive your results only by: [place] (if you have MyChart) OR A paper copy in the mail If you have any lab test that is abnormal or we need to change your treatment, we will call you to review the results.   Testing/Procedures: NONE   Follow-Up: At [facility], you and your health needs are our priority.  As part of our continuing mission to provide you with exceptional heart care, we have created designated Provider Care Teams.  These Care Teams include your primary Cardiologist (physician) and Advanced Practice Providers (APPs -  Physician Assistants and Nurse Practitioners) who all work together to provide you with the care you need, when you need it.  We recommend signing up for the patient portal called "MyChart".  Sign up information is provided on this After Visit Summary.  MyChart is used to connect with patients for Virtual Visits (Telemedicine).  Patients are able to view lab/test results, encounter notes, upcoming appointments, etc.  Non-urgent messages can be sent to your provider as well.   To learn more about what you can do with MyChart, go to [email or web address].    Your next appointment:   KEEP SCHEDULED FOLLOW-UP  Important Information About Sugar         Weston Brass Angelena Form, PA-C  06/02/2022 3:26 PM    [place]

## 2022-06-02 ENCOUNTER — Ambulatory Visit: Payer: Medicare Other | Attending: Cardiology | Admitting: Physician Assistant

## 2022-06-02 VITALS — BP 130/76 | HR 61 | Ht 69.5 in | Wt 200.0 lb

## 2022-06-02 DIAGNOSIS — R079 Chest pain, unspecified: Secondary | ICD-10-CM

## 2022-06-02 DIAGNOSIS — I447 Left bundle-branch block, unspecified: Secondary | ICD-10-CM

## 2022-06-02 DIAGNOSIS — R600 Localized edema: Secondary | ICD-10-CM | POA: Diagnosis not present

## 2022-06-02 DIAGNOSIS — Z952 Presence of prosthetic heart valve: Secondary | ICD-10-CM

## 2022-06-02 DIAGNOSIS — N289 Disorder of kidney and ureter, unspecified: Secondary | ICD-10-CM

## 2022-06-02 DIAGNOSIS — I10 Essential (primary) hypertension: Secondary | ICD-10-CM

## 2022-06-02 MED ORDER — POTASSIUM CHLORIDE CRYS ER 20 MEQ PO TBCR: 20.0000 meq | EXTENDED_RELEASE_TABLET | Freq: Every day | ORAL | 3 refills | Status: DC

## 2022-06-02 MED ORDER — FUROSEMIDE 40 MG PO TABS: 40.0000 mg | ORAL_TABLET | Freq: Every day | ORAL | 3 refills | Status: DC

## 2022-06-02 NOTE — Patient Instructions (Addendum)
Medication Instructions:  Your physician has recommended you make the following change in your medication:  START LASIX 40 MG DAILY. START POTASSIUM 20 MEQ DAILY.  *If you need a refill on your cardiac medications before your next appointment, please call your pharmacy*   Lab Work: NONE If you have labs (blood work) drawn today and your tests are completely normal, you will receive your results only by: MyChart Message (if you have MyChart) OR A paper copy in the mail If you have any lab test that is abnormal or we need to change your treatment, we will call you to review the results.   Testing/Procedures: NONE   Follow-Up: At [facility], you and your health needs are our priority.  As part of our continuing mission to provide you with exceptional heart care, we have created designated Provider Care Teams.  These Care Teams include your primary Cardiologist (physician) and Advanced Practice Providers (APPs -  Physician Assistants and Nurse Practitioners) who all work together to provide you with the care you need, when you need it.  We recommend signing up for the patient portal called "MyChart".  Sign up information is provided on this After Visit Summary.  MyChart is used to connect with patients for Virtual Visits (Telemedicine).  Patients are able to view lab/test results, encounter notes, upcoming appointments, etc.  Non-urgent messages can be sent to your provider as well.   To learn more about what you can do with MyChart, go to [email or web address].    Your next appointment:   KEEP SCHEDULED FOLLOW-UP  Important Information About Sugar

## 2022-06-07 NOTE — Progress Notes (Signed)
LE edema.

## 2022-06-25 ENCOUNTER — Other Ambulatory Visit (HOSPITAL_COMMUNITY): Payer: Medicare Other

## 2022-06-25 ENCOUNTER — Ambulatory Visit: Payer: Medicare Other

## 2022-06-30 ENCOUNTER — Ambulatory Visit (HOSPITAL_COMMUNITY): Payer: Medicare Other | Attending: Cardiology

## 2022-06-30 ENCOUNTER — Ambulatory Visit (INDEPENDENT_AMBULATORY_CARE_PROVIDER_SITE_OTHER): Payer: Medicare Other | Admitting: Physician Assistant

## 2022-06-30 VITALS — BP 166/96 | HR 68 | Ht 69.5 in

## 2022-06-30 DIAGNOSIS — Z952 Presence of prosthetic heart valve: Secondary | ICD-10-CM | POA: Diagnosis not present

## 2022-06-30 DIAGNOSIS — I10 Essential (primary) hypertension: Secondary | ICD-10-CM | POA: Diagnosis not present

## 2022-06-30 DIAGNOSIS — I447 Left bundle-branch block, unspecified: Secondary | ICD-10-CM

## 2022-06-30 DIAGNOSIS — R079 Chest pain, unspecified: Secondary | ICD-10-CM | POA: Insufficient documentation

## 2022-06-30 DIAGNOSIS — N289 Disorder of kidney and ureter, unspecified: Secondary | ICD-10-CM | POA: Insufficient documentation

## 2022-06-30 DIAGNOSIS — R600 Localized edema: Secondary | ICD-10-CM | POA: Insufficient documentation

## 2022-06-30 DIAGNOSIS — I350 Nonrheumatic aortic (valve) stenosis: Secondary | ICD-10-CM | POA: Diagnosis not present

## 2022-06-30 MED ORDER — FUROSEMIDE 40 MG PO TABS: 60.0000 mg | ORAL_TABLET | Freq: Every day | ORAL | 3 refills | Status: DC

## 2022-06-30 NOTE — Progress Notes (Signed)
[facility] [place]                                     Cardiology Office Note:    Date:  07/02/2022   ID:  FRANCE LUSTY, DOB [birth year], MRN [id number]  PCP:  Sharilyn Sites, MD  [facility] Cardiologist:  Rozann Lesches, MD / Dr. Buena Irish & Dr. Laverta Baltimore (TAVR) [facility] Electrophysiologist:  None   Referring MD: Sharilyn Sites, MD   1 month s/p TAVR  History of Present Illness:    [name] is a 88 y.o. female with a hx of arthritis, CAD, chronic bronchitis, anxiety/depression, HLD, hypothyroidism, wheelchair bound and severe aortic stenosis s/p TAVR (05/25/22) who presents to clinic for follow up.  The structural heart team initially evaluated her on 08/27/2020 for consideration of TAVR. L/RHC in 06/2020 showed mild non-obstructive CAD. At that time she looked very frail and it was not clear that she was having significant symptoms and it was decided to hold off on TAVR. We decided to continue following her aortic stenosis.  Her most recent echocardiogram on 03/11/2022 showed an increase in the mean gradient to 68 mmHg with a peak gradient of 104 mmHg.  Left ventricular ejection fraction was 65 to 70% with severe LVH. She reported having recent episodes of substernal chest discomfort in the center of her chest that last about 30 minutes as well as progressive fatigue and generalized weakness. She reported living alone and taking care of all of her own ADLs.    She was evaluated by the multidisciplinary valve team and underwent successful TAVR with a 26 mm Edwards Sapien 3 Ultra Resilia THV via the TF approach on 05/25/22. Post operative echo showed EF 70%, severe asymmetric LVH, chordal SAM, normally functioning TAVR with a mean gradient of 20 mmHg and no PVL. She was discharged on home aspirin. Given new LBBB, she was discharged with a Zio AT.   In follow up she had worsening LE edema. I stared Lasix 57m daily and Kdur 228m  daily. Also had one episode of chest pain. I asked her to try nitrates for this the next time it happened.   Today the patient presents to clinic for follow up. Here with daughter. Doing great. Swelling in legs are improving but still 2+. No CP or SOB. No LE edema, orthopnea or PND. No dizziness or syncope. No blood in stool or urine. No palpitations.    Past Medical History:  Diagnosis Date   Anxiety    Arthritis    CAD (coronary artery disease)    Mild at cardiac catheterization 2018   Chronic bronchitis    Constipation    Depression    Hyperlipidemia    Hypothyroidism    S/P TAVR (transcatheter aortic valve replacement) 05/25/2022   [id number] via TF approach with Dr. McAAngelena Formd Dr. BarCyndia BentSevere aortic stenosis     Past Surgical History:  Procedure Laterality Date   BUNIONECTOMY Left 1975   CARDIAC CATHETERIZATION  07/04/2020   CHOLECYSTECTOMY N/A 02/15/2017   Procedure: LAPAROSCOPIC CHOLECYSTECTOMY;  Surgeon: RamRalene OkD;  [facility] [place] [facility];  Laterality: N/A;   CORONARY ANGIOGRAPHY N/A 02/14/2017   Procedure: Coronary Angiography;  Surgeon: EndNelva BushD;  Location: [facility] [place] [facility];  Service: Cardiovascular;  Laterality: N/A;   EYE SURGERY  2009  bilateral   HEMATOMA EVACUATION  06/04/2011   Procedure: EVACUATION HEMATOMA;  Surgeon: Arther Abbott, MD;  Location: [facility];  Service: Orthopedics;  Laterality: Right;  evacuation of seroma   INTRAOPERATIVE TRANSTHORACIC ECHOCARDIOGRAM N/A 05/25/2022   Procedure: INTRAOPERATIVE TRANSTHORACIC ECHOCARDIOGRAM;  Surgeon: Burnell Blanks, MD;  Location: [place] [facility];  Service: Open Heart Surgery;  Laterality: N/A;   JOINT REPLACEMENT  05/2010   left total knee Dr. Aline Brochure   PATELLAR TENDON REPAIR  06/04/2011   Procedure: PATELLA TENDON REPAIR;  Surgeon: Arther Abbott, MD;  Location: [facility];  Service: Orthopedics;  Laterality: Right;   PATELLAR TENDON REPAIR  08/30/2011    Procedure: PATELLA TENDON REPAIR;  Surgeon: Arther Abbott, MD;  Location: [facility];  Service: Orthopedics;  Laterality: Right;  Allograft Reconstrucation Right Patella Tendon   RIGHT/LEFT HEART CATH AND CORONARY ANGIOGRAPHY N/A 07/04/2020   Procedure: RIGHT/LEFT HEART CATH AND CORONARY ANGIOGRAPHY;  Surgeon: Burnell Blanks, MD;  Location: [place] [facility];  Service: Cardiovascular;  Laterality: N/A;   TOTAL HIP ARTHROPLASTY Left 1998   TOTAL KNEE ARTHROPLASTY  05/03/2011   Procedure: TOTAL KNEE ARTHROPLASTY;  Surgeon: Arther Abbott, MD;  Location: [facility];  Service: Orthopedics;  Laterality: Right;  With DePuy   TOTAL THYROIDECTOMY  11/2009   Dr. Benjamine Mola @ [facility]   TRANSCATHETER AORTIC VALVE REPLACEMENT, TRANSFEMORAL Right 05/25/2022   Procedure: Transcatheter Aortic Valve Replacement, Transfemoral;  Surgeon: Burnell Blanks, MD;  Location: [place] [facility];  Service: Open Heart Surgery;  Laterality: Right;   TUBAL LIGATION      Current Medications: Current Meds  Medication Sig   acetaminophen (TYLENOL) 500 MG tablet Take 500 mg by mouth every 6 (six) hours as needed for moderate pain.   aspirin EC 81 MG tablet Take 81 mg by mouth daily.   Cholecalciferol (D3 HIGH POTENCY) 50 MCG (2000 UT) CAPS Take 2,000 Units by mouth daily.   ibuprofen (ADVIL,MOTRIN) 200 MG tablet Take 600 mg by mouth every 8 (eight) hours as needed for moderate pain (shoulder pain).   levothyroxine (SYNTHROID) 125 MCG tablet Take 125 mcg by mouth daily before breakfast.   mineral oil-hydrophilic petrolatum (AQUAPHOR) ointment Apply 1 Application topically as needed for irritation.   neomycin-bacitracin-polymyxin (NEOSPORIN) OINT Apply 1 Application topically as needed for wound care.   nitroGLYCERIN (NITROSTAT) 0.4 MG SL tablet Place 1 tablet (0.4 mg total) under the tongue every 5 (five) minutes as needed for chest pain.   Nystatin POWD Apply 1 Application topically as needed (yeast).   omeprazole  (PRILOSEC) 20 MG capsule Take 20 mg by mouth daily.   oxybutynin (DITROPAN XL) 15 MG 24 hr tablet Take 15 mg by mouth daily.   PARoxetine (PAXIL) 20 MG tablet Take 20 mg by mouth daily.   [DISCONTINUED] furosemide (LASIX) 40 MG tablet Take 1 tablet (40 mg total) by mouth daily.   [DISCONTINUED] potassium chloride SA (KLOR-CON M) 20 MEQ tablet Take 1 tablet (20 mEq total) by mouth daily.     Allergies:   Chlorhexidine   Social History   Socioeconomic History   Marital status: Widowed    Spouse name: Not on file   Number of children: 2   Years of education: Not on file   Highest education level: Not on file  Occupational History   Occupation: Retired-Office work  Tobacco Use   Smoking status: Former    Types: Cigarettes    Quit date: 09/27/1994    Years since quitting: 16.7  Smokeless tobacco: Never  Vaping Use   Vaping Use: Never used  Substance and Sexual Activity   Alcohol use: No   Drug use: No   Sexual activity: Not Currently  Other Topics Concern   Not on file  Social History Narrative   ** Merged History Encounter **       Social Determinants of Health   Financial Resource Strain: Not on file  Food Insecurity: Not on file  Transportation Needs: Not on file  Physical Activity: Not on file  Stress: Not on file  Social Connections: Not on file     Family History: [name] family history includes Blindness in her sister; Colon cancer in her mother. There is no history of Anesthesia problems, Hypotension, Malignant hyperthermia, or Pseudochol deficiency.  ROS:   Please see the history of present illness.    All other systems reviewed and are negative.  EKGs/Labs/Other Studies Reviewed:    The following studies were reviewed today:  TAVR OPERATIVE NOTE     Date of Procedure:                05/25/2022   Preoperative Diagnosis:      Severe Aortic Stenosis    Postoperative Diagnosis:    Same    Procedure:        Transcatheter Aortic Valve  Replacement - Transfemoral Approach     Edwards Sapien 3 THV (size 26 mm, model # [id number], serial # [id number])              Co-Surgeons:                        Lauree Chandler, MD and Gaye Pollack, MD    Anesthesiologist:                  Gifford Shave   Echocardiographer:              Gasper Sells   Pre-operative Echo Findings: Severe aortic stenosis Normal left ventricular systolic function   Post-operative Echo Findings: No paravalvular leak Normal left ventricular systolic function _____________     Echo 05/26/22: IMPRESSIONS   1. Left ventricular ejection fraction, by estimation, is 70 to 75%. The  left ventricle has hyperdynamic function. The left ventricle has no  regional wall motion abnormalities. There is severe asymmetric left  ventricular hypertrophy. Left ventricular  diastolic parameters are consistent with Grade II diastolic dysfunction  (pseudonormalization). Elevated left atrial pressure. There is Chordal SAM  withan LVOT gradient of 44 mm Hg at peak. There is no significant  secondary MR.   2. The aortic valve has been repaired/replaced. Aortic valve  regurgitation is not visualized. There is a 26 mm Edwards Sapien  prosthetic (TAVR) valve present in the aortic position. Procedure Date:  05/25/22. EFfectice orifice area, by VTI measures 2.60  cm. Aortic valve mean gradient measures 20.0 mmHg. Peak gradient 43 mm  Hg, DVI is normal, AVA 2.67 cm2.   3. Right ventricular systolic function is normal. The right ventricular  size is normal. There is moderately elevated pulmonary artery systolic  pressure. The estimated right ventricular systolic pressure is 76.2 mmHg.   4. Left atrial size was moderately dilated.   5. The mitral valve is normal in structure. Trivial mitral valve  regurgitation. No evidence of mitral stenosis. Moderate mitral annular  calcification.   6. The inferior vena cava is normal in size with greater than 50%  respiratory variability,  suggesting right atrial  pressure of 3 mmHg.   Comparison(s): Echo findings are consistent with normal structure and  function of the aortic valve prosthesis. Increased in all gradients (LVOT,  aortic valve) may be partialy related to hyperdynamic function. This is  not consistent with Neo-LVOT  pathology seen in the suicide left ventricle phenotype.   ____________________________  Echo 06/30/22 IMPRESSIONS   1. Left ventricular ejection fraction, by estimation, is 65 to 70%. The  left ventricle has normal function. The left ventricle has no regional  wall motion abnormalities. There is moderate asymmetric left ventricular  hypertrophy. Left ventricular  diastolic parameters are consistent with Grade I diastolic dysfunction  (impaired relaxation). Elevated left ventricular end-diastolic pressure.   2. Right ventricular systolic function is normal. The right ventricular  size is normal. There is normal pulmonary artery systolic pressure.   3. Left atrial size was moderately dilated.   4. The mitral valve is grossly normal. Trivial mitral valve  regurgitation. No evidence of mitral stenosis. Moderate mitral annular  calcification.   5. Mean gradient improved since prior, was 20 mmHg on 05/26/22. The aortic  valve has been repaired/replaced. Aortic valve regurgitation is not  visualized. There is a 26 mm Edwards Sapien prosthetic (TAVR) valve  present in the aortic position. Procedure  Date: 05/25/2022. Echo findings are consistent with normal structure and  function of the aortic valve prosthesis. Aortic valve mean gradient  measures 10.0 mmHg.   6. The inferior vena cava is normal in size with greater than 50%  respiratory variability, suggesting right atrial pressure of 3 mmHg.   Comparison(s): Changes from prior study are noted. Mean gradient across  TAVR improved since prior, was 20 mmHg on 05/26/22.   Conclusion(s)/Recommendation(s): Normal appearing TAVR.   EKG:  EKG is NOT  ordered today.  Recent Labs: 05/18/2022: ALT 15; B Natriuretic Peptide 415.3 05/26/2022: Hemoglobin 11.2; Magnesium 1.9; Platelets 254 06/30/2022: BUN 17; Creatinine, Ser 1.01; Potassium 3.5; Sodium 144  Recent Lipid Panel No results found for: "CHOL", "TRIG", "HDL", "CHOLHDL", "VLDL", "LDLCALC", "LDLDIRECT"   Risk Assessment/Calculations:       Physical Exam:    VS:  BP (!) 166/96   Pulse 68   Ht 5' 9.5" (1.765 m)   BMI 29.11 kg/m     Wt Readings from Last 3 Encounters:  06/02/22 200 lb (90.7 kg)  05/26/22 202 lb 13.2 oz (92 kg)  05/18/22 200 lb (90.7 kg)     GEN:  Well nourished, well developed in no acute distress. Elderly and frail. In wheelchair HEENT: Normal NECK: No JVD LYMPHATICS: No lymphadenopathy CARDIAC: RRR, no murmurs, rubs, gallops RESPIRATORY:  Clear to auscultation without rales, wheezing or rhonchi  ABDOMEN: Soft, non-tender, non-distended MUSCULOSKELETAL:  2+ pitting edema to knees. Redness in feet and ankles.. No warmth; No deformity  SKIN: Warm and dry NEUROLOGIC:  Alert and oriented x 3 PSYCHIATRIC:  Normal affect   ASSESSMENT:    1. S/P TAVR (transcatheter aortic valve replacement)   2. LBBB (left bundle branch block)   3. Lower extremity edema   4. Essential hypertension   5. Renal lesion   6. Chest pain of uncertain etiology     PLAN:    In order of problems listed above:  Severe AS s/p TAVR: echo today shows EF 65%, normally functioning TAVR with a mean gradient of 10 mm hg and no PVL as well as moderate MAC. She has NYHA class II symptoms. Continue on a baby aspirin. SBE prophylaxis discussed; the patient is  edentulous and does not go to the dentist. I will see her back for 1 year follow up and echo   New LBBB: Zio with no HAVB.   LE edema: started on Lasix 56m daily and Kdur 242m daily with improvement. Still 2+. Will increase lasix to 6028mnd Kdur to 30 mg daily. I wrote a Rx for compression stockings as well. Check BMET today.     HTN: BP elevated. I have increased lasix.    Renal lesion: pre TAVR CT showed a subcentimeter indeterminate upper pole left renal lesion is slightly increased in size when compared with prior exam. Recommend renal protocol CT or MRI for further evaluation. I discussed with patient and we are going to hold off on further imaging at this time given age.    Chest pain: non exertional and sounds non cardiac. LHC in 2021 with nonobst CAD.   Medication Adjustments/Labs and Tests Ordered: Current medicines are reviewed at length with the patient today.  Concerns regarding medicines are outlined above.  Orders Placed This Encounter  Procedures   Basic metabolic panel   ECHOCARDIOGRAM COMPLETE   Meds ordered this encounter  Medications   furosemide (LASIX) 40 MG tablet    Sig: Take 1.5 tablets (60 mg total) by mouth daily.    Dispense:  180 tablet    Refill:  3    Patient Instructions  Medication Instructions:  Increase Lasix to 60 mg daily   *If you need a refill on your cardiac medications before your next appointment, please call your pharmacy*   Lab Work: Bmp - today   If you have labs (blood work) drawn today and your tests are completely normal, you will receive your results only by: MyCSalomef you have MyChart) OR A paper copy in the mail If you have any lab test that is abnormal or we need to change your treatment, we will call you to review the results.   Testing/Procedures: Your physician has requested that you have an echocardiogram in August 2024. Echocardiography is a painless test that uses sound waves to create images of your heart. It provides your doctor with information about the size and shape of your heart and how well your heart's chambers and valves are working. This procedure takes approximately one hour. There are no restrictions for this procedure.   Follow-Up: Follow up as scheduled   Other Instructions   Important Information About  Sugar      iNCRE   Signed, KatAngelena FormA-C  07/02/2022 8:06 AM    [place]

## 2022-06-30 NOTE — Patient Instructions (Addendum)
Medication Instructions:  Increase Lasix to 60 mg daily   *If you need a refill on your cardiac medications before your next appointment, please call your pharmacy*   Lab Work: Bmp - today   If you have labs (blood work) drawn today and your tests are completely normal, you will receive your results only by: [place] (if you have MyChart) OR A paper copy in the mail If you have any lab test that is abnormal or we need to change your treatment, we will call you to review the results.   Testing/Procedures: Your physician has requested that you have an echocardiogram in August 2024. Echocardiography is a painless test that uses sound waves to create images of your heart. It provides your doctor with information about the size and shape of your heart and how well your heart's chambers and valves are working. This procedure takes approximately one hour. There are no restrictions for this procedure.   Follow-Up: Follow up as scheduled   Other Instructions   Important Information About Sugar      iNCRE

## 2022-07-01 ENCOUNTER — Other Ambulatory Visit: Payer: Self-pay | Admitting: *Deleted

## 2022-07-01 LAB — BASIC METABOLIC PANEL
BUN/Creatinine Ratio: 17 (ref 12–28)
BUN: 17 mg/dL (ref 8–27)
CO2: 29 mmol/L (ref 20–29)
Calcium: 9.2 mg/dL (ref 8.7–10.3)
Chloride: 98 mmol/L (ref 96–106)
Creatinine, Ser: 1.01 mg/dL — ABNORMAL HIGH (ref 0.57–1.00)
Glucose: 118 mg/dL — ABNORMAL HIGH (ref 70–99)
Potassium: 3.5 mmol/L (ref 3.5–5.2)
Sodium: 144 mmol/L (ref 134–144)
eGFR: 54 mL/min/{1.73_m2} — ABNORMAL LOW (ref >59)

## 2022-07-01 LAB — ECHOCARDIOGRAM COMPLETE
AR max vel: 2.87 cm2
AV Area VTI: 2.64 cm2
AV Area mean vel: 3.06 cm2
AV Mean grad: 10 mmHg
AV Peak grad: 16.8 mmHg
Ao pk vel: 2.05 m/s
Area-P 1/2: 4.29 cm2
MV VTI: 3.44 cm2
S' Lateral: 1.9 cm

## 2022-07-01 MED ORDER — POTASSIUM CHLORIDE CRYS ER 20 MEQ PO TBCR: 30.0000 meq | EXTENDED_RELEASE_TABLET | Freq: Every day | ORAL | 3 refills | Status: DC

## 2022-07-01 NOTE — Addendum Note (Signed)
Encounter addended by: Markus Daft A on: 07/01/2022 10:13 AM  Actions taken: Imaging Exam ended

## 2022-07-30 ENCOUNTER — Encounter: Payer: Self-pay | Admitting: Student

## 2022-07-30 ENCOUNTER — Telehealth: Payer: Self-pay | Admitting: Cardiology

## 2022-07-30 ENCOUNTER — Ambulatory Visit: Payer: Medicare Other | Attending: Student | Admitting: Student

## 2022-07-30 DIAGNOSIS — Z79899 Other long term (current) drug therapy: Secondary | ICD-10-CM

## 2022-07-30 DIAGNOSIS — I447 Left bundle-branch block, unspecified: Secondary | ICD-10-CM | POA: Diagnosis not present

## 2022-07-30 DIAGNOSIS — R600 Localized edema: Secondary | ICD-10-CM | POA: Diagnosis not present

## 2022-07-30 DIAGNOSIS — Z952 Presence of prosthetic heart valve: Secondary | ICD-10-CM

## 2022-07-30 DIAGNOSIS — I2510 Atherosclerotic heart disease of native coronary artery without angina pectoris: Secondary | ICD-10-CM | POA: Diagnosis not present

## 2022-07-30 NOTE — Patient Instructions (Signed)
Medication Instructions:  Your physician recommends that you continue on your current medications as directed. Please refer to the Current Medication list given to you today.  *If you need a refill on your cardiac medications before your next appointment, please call your pharmacy*   Lab Work: Your physician recommends that you return for lab work.(BMET, Mg)   If you have labs (blood work) drawn today and your tests are completely normal, you will receive your results only by: [place] (if you have MyChart) OR A paper copy in the mail If you have any lab test that is abnormal or we need to change your treatment, we will call you to review the results.   Testing/Procedures: NONE   Follow-Up: At [facility], you and your health needs are our priority.  As part of our continuing mission to provide you with exceptional heart care, we have created designated Provider Care Teams.  These Care Teams include your primary Cardiologist (physician) and Advanced Practice Providers (APPs -  Physician Assistants and Nurse Practitioners) who all work together to provide you with the care you need, when you need it.  We recommend signing up for the patient portal called "MyChart".  Sign up information is provided on this After Visit Summary.  MyChart is used to connect with patients for Virtual Visits (Telemedicine).  Patients are able to view lab/test results, encounter notes, upcoming appointments, etc.  Non-urgent messages can be sent to your provider as well.   To learn more about what you can do with MyChart, go to [email or web address].    Your next appointment:   3 month(s)  The format for your next appointment:   In Person  Provider:   Rozann Lesches, MD    Other Instructions Thank you for choosing [facility]!    Important Information About Sugar

## 2022-07-30 NOTE — Telephone Encounter (Signed)
I spoke with daughter. Patient didn't fall asleep until 6 am this morning. We will change office visit to VV.

## 2022-07-30 NOTE — Progress Notes (Signed)
Virtual Visit via Telephone Note   Because of [name]'s co-morbid illnesses, she is at least at moderate risk for complications without adequate follow up.  This format is felt to be most appropriate for this patient at this time.  The patient did not have access to video technology/had technical difficulties with video requiring transitioning to audio format only (telephone).  All issues noted in this document were discussed and addressed.  No physical exam could be performed with this format.  Please refer to the patient's chart for her consent to telehealth for [name].   Date:  07/30/2022   ID:  [name], DOB 1932/11/15, MRN [id number] The patient was identified using 2 identifiers.  Patient Location: Home Provider Location: Office/Clinic  PCP:  Assunta Found, MD   [facility] Providers Cardiologist:  Nona Dell, MD     Evaluation Performed:  Follow-Up Visit  Chief Complaint:  1 month visit  History of Present Illness:    [name] is a 88 y.o. female with past medical history of aortic stenosis (s/p TAVR in 04/2022 with 26 mm Edwards Sapien 3 THV), CAD (catheterization in 06/2020 showing 25% mid LAD stenosis), HLD, LBBB (new following TAVR and event monitor reassuring), arthritis and hypothyroidism who presents for a 51-month follow-up telehealth visit.   She was last examined by Carlean Jews, PA on 06/30/2022 and reported overall feeling well at that time with no recent chest pain or dyspnea on exertion. She did report still having lower extremity edema and Lasix was increased from 40 mg daily to 60 mg daily and she was encouraged to use compression stockings. Repeat labs were checked that day and creatinine was slightly elevated to 1.01 but K+ was normal at 3.5. Prior pre-TAVR CT had shown an indeterminate upper pole left renal lesion which was slightly increased when compared to prior examinations but given her age,  further imaging was not pursued at that time.  The patient was initially scheduled for an in office visit today but switched to a virtual visit given difficulty falling asleep last night. She reports overall doing well since her visit last month and says that her breathing has been stable. No specific orthopnea, PND or dyspnea on exertion. She is mostly in a wheelchair throughout the day but performs ADL's and chores around the house independently. Denies any recent chest pain or palpitations. Reports her lower extremity edema significantly improved following dose titration of Lasix to 60 mg daily and she has not noticed pitting edema.   Past Medical History:  Diagnosis Date   Anxiety    Arthritis    CAD (coronary artery disease)    Mild at cardiac catheterization 2018   Chronic bronchitis    Constipation    Depression    Hyperlipidemia    Hypothyroidism    S/P TAVR (transcatheter aortic valve replacement) 05/25/2022   63mm S3UR via TF approach with Dr. Clifton James and Dr. Laneta Simmers   Severe aortic stenosis    Past Surgical History:  Procedure Laterality Date   BUNIONECTOMY Left 1975   CARDIAC CATHETERIZATION  07/04/2020   CHOLECYSTECTOMY N/A 02/15/2017   Procedure: LAPAROSCOPIC CHOLECYSTECTOMY;  Surgeon: Axel Filler, MD;  Location: [facility] OR;  Service: General;  Laterality: N/A;   CORONARY ANGIOGRAPHY N/A 02/14/2017   Procedure: Coronary Angiography;  Surgeon: Yvonne Kendall, MD;  Location: [facility] INVASIVE CV LAB;  Service: Cardiovascular;  Laterality: N/A;   EYE SURGERY  2009   bilateral   HEMATOMA EVACUATION  06/04/2011   Procedure: EVACUATION HEMATOMA;  Surgeon: Fuller Canada, MD;  Location: [facility] ORS;  Service: Orthopedics;  Laterality: Right;  evacuation of seroma   INTRAOPERATIVE TRANSTHORACIC ECHOCARDIOGRAM N/A 05/25/2022   Procedure: INTRAOPERATIVE TRANSTHORACIC ECHOCARDIOGRAM;  Surgeon: Kathleene Hazel, MD;  Location: [facility];  Service: Open Heart Surgery;   Laterality: N/A;   JOINT REPLACEMENT  05/2010   left total knee Dr. Romeo Apple   PATELLAR TENDON REPAIR  06/04/2011   Procedure: PATELLA TENDON REPAIR;  Surgeon: Fuller Canada, MD;  Location: [facility] ORS;  Service: Orthopedics;  Laterality: Right;   PATELLAR TENDON REPAIR  08/30/2011   Procedure: PATELLA TENDON REPAIR;  Surgeon: Fuller Canada, MD;  Location: [facility] OR[facility];  Service: Orthopedics;  Laterality: Right;  Allograft Reconstrucation Right Patella Tendon   RIGHT/LEFT HEART CATH AND CORONARY ANGIOGRAPHY N/A 07/04/2020   Procedure: RIGHT/LEFT HEART CATH AND CORONARY ANGIOGRAPHY;  Surgeon: Kathleene Hazel, MD;  Location: [facility];  Service: Cardiovascular;  Laterality: N/A;   TOTAL HIP ARTHROPLASTY Left 1998   TOTAL KNEE ARTHROPLASTY  05/03/2011   Procedure: TOTAL KNEE ARTHROPLASTY;  Surgeon: Fuller Canada, MD;  Location: [facility];  Service: Orthopedics;  Laterality: Right;  With DePuy   TOTAL THYROIDECTOMY  11/2009   Dr. Suszanne Conners @ [facility]   TRANSCATHETER AORTIC VALVE REPLACEMENT, TRANSFEMORAL Right 05/25/2022   Procedure: Transcatheter Aortic Valve Replacement, Transfemoral;  Surgeon: Kathleene Hazel, MD;  Location: [facility] INVASIVE CV LAB;  Service: Open Heart Surgery;  Laterality: Right;   TUBAL LIGATION       Current Meds  Medication Sig   aspirin EC 81 MG tablet Take 81 mg by mouth daily.   Cholecalciferol (D3 HIGH POTENCY) 50 MCG (2000 UT) CAPS Take 2,000 Units by mouth daily.   furosemide (LASIX) 40 MG tablet Take 1.5 tablets (60 mg total) by mouth daily.   ibuprofen (ADVIL,MOTRIN) 200 MG tablet Take 600 mg by mouth every 8 (eight) hours as needed for moderate pain (shoulder pain).   levothyroxine (SYNTHROID) 125 MCG tablet Take 125 mcg by mouth daily before breakfast.   mineral oil-hydrophilic petrolatum (AQUAPHOR) ointment Apply 1 Application topically as needed for irritation.   neomycin-bacitracin-polymyxin (NEOSPORIN) OINT Apply 1 Application topically as needed for  wound care.   nitroGLYCERIN (NITROSTAT) 0.4 MG SL tablet Place 1 tablet (0.4 mg total) under the tongue every 5 (five) minutes as needed for chest pain.   Nystatin POWD Apply 1 Application topically as needed (yeast).   omeprazole (PRILOSEC) 20 MG capsule Take 20 mg by mouth daily.   oxybutynin (DITROPAN XL) 15 MG 24 hr tablet Take 15 mg by mouth daily.   PARoxetine (PAXIL) 20 MG tablet Take 20 mg by mouth daily.   potassium chloride SA (KLOR-CON M) 20 MEQ tablet Take 1.5 tablets (30 mEq total) by mouth daily.     Allergies:   Patient has no active allergies.   Social History   Tobacco Use   Smoking status: Former    Types: Cigarettes    Quit date: 09/27/1994    Years since quitting: 27.8   Smokeless tobacco: Never  Vaping Use   Vaping Use: Never used  Substance Use Topics   Alcohol use: No   Drug use: No     Family Hx: [name] family history includes Blindness in her sister; Colon cancer in her mother. There is no history of Anesthesia problems, Hypotension, Malignant hyperthermia, or Pseudochol deficiency.  ROS:   Please see the history of present illness.  All other systems reviewed and are negative.   Prior CV studies:   The following studies were reviewed today:  R/LHC: 06/2020 Mid LAD lesion is 25% stenosed.   1. Mild non-obstructive CAD 2. Severe aortic stenosis (mean gradient 47.6 mmHg, peak to peak gradient 43 mmHg, AVA 0.89 cm2).    Recommendations: Continue planning for TAVR    Echocardiogram: 06/2022 IMPRESSIONS     1. Left ventricular ejection fraction, by estimation, is 65 to 70%. The  left ventricle has normal function. The left ventricle has no regional  wall motion abnormalities. There is moderate asymmetric left ventricular  hypertrophy. Left ventricular  diastolic parameters are consistent with Grade I diastolic dysfunction  (impaired relaxation). Elevated left ventricular end-diastolic pressure.   2. Right ventricular systolic  function is normal. The right ventricular  size is normal. There is normal pulmonary artery systolic pressure.   3. Left atrial size was moderately dilated.   4. The mitral valve is grossly normal. Trivial mitral valve  regurgitation. No evidence of mitral stenosis. Moderate mitral annular  calcification.   5. Mean gradient improved since prior, was 20 mmHg on 05/26/22. The aortic  valve has been repaired/replaced. Aortic valve regurgitation is not  visualized. There is a 26 mm Edwards Sapien prosthetic (TAVR) valve  present in the aortic position. Procedure  Date: 05/25/2022. Echo findings are consistent with normal structure and  function of the aortic valve prosthesis. Aortic valve mean gradient  measures 10.0 mmHg.   6. The inferior vena cava is normal in size with greater than 50%  respiratory variability, suggesting right atrial pressure of 3 mmHg.   Comparison(s): Changes from prior study are noted. Mean gradient across  TAVR improved since prior, was 20 mmHg on 05/26/22.   Event Monitor: 06/2022 ZIO AT.  13 days, 22 hours analyzed.   Predominant rhythm is sinus with IVCD.  Heart rate ranged from 39 bpm up to 124 bpm and average heart rate 74 bpm. There were rare PACs including atrial couplets and triplets representing less than 1% total beats. There were rare PVCs representing less than 1% total beats.  Also limited episode of ventricular trigeminy. Multiple, brief episodes of PSVT were noted, the longest of which lasted 15 beats with average heart rate 114 bpm. There were no pauses. Patient triggered episodes and reported chest pain generally correlated with sinus rhythm.  Labs/Other Tests and Data Reviewed:    EKG:  An ECG dated 06/02/2022 was personally reviewed today and demonstrated:  NSR, HR 61 with LBBB.   Recent Labs: 05/18/2022: ALT 15; B Natriuretic Peptide 415.3 05/26/2022: Hemoglobin 11.2; Magnesium 1.9; Platelets 254 06/30/2022: BUN 17; Creatinine, Ser 1.01;  Potassium 3.5; Sodium 144   Recent Lipid Panel No results found for: "CHOL", "TRIG", "HDL", "CHOLHDL", "LDLCALC", "LDLDIRECT"  Wt Readings from Last 3 Encounters:  06/02/22 200 lb (90.7 kg)  05/26/22 202 lb 13.2 oz (92 kg)  05/18/22 200 lb (90.7 kg)         Objective:    Vital Signs:  There were no vitals taken for this visit.   General: Pleasant female sounding in no acute distress.  Psych: Normal affect. Neuro: Alert and oriented X 3.  Lungs:  Resp regular and unlabored while talking on the phone.   ASSESSMENT & PLAN:    1. Aortic Stenosis - She underwent TAVR in 04/2022 with 26 mm Edwards Sapien 3 THV. Recent echocardiogram last month showed her TAVR valve was functioning normally with mean gradient of 10 mmHg.  2. Lower Extremity Edema - She reports her lower extremity edema significantly improved following dose titration of Lasix from 40 mg daily to 60 mg daily last month. Also taking K-dur 30 mEq daily. Will plan to obtain a follow-up BMET and Mg next week for reassessment of her electrolytes and renal function.   3. CAD - Prior catheterization in 06/2020 showed 25% mid-LAD stenosis. She was previously having some chest discomfort following TAVR but denies any recent symptoms. We reviewed that her prior catheterization was reassuring and she does not require further testing at this time. - Continue ASA 81 mg daily and PRN SL NTG.  4. LBBB - Noted following TAVR and Zio monitor as an outpatient showed no significant pauses or evidence of CHB.   Time:   Today, I have spent 16 minutes with the patient with telehealth technology discussing the above problems.     Medication Adjustments/Labs and Tests Ordered: Current medicines are reviewed at length with the patient today.  Concerns regarding medicines are outlined above.   Tests Ordered: Orders Placed This Encounter  Procedures   Basic Metabolic Panel (BMET)   Magnesium    Medication Changes: No orders of the  defined types were placed in this encounter.   Follow Up:  In Person in 3 month(s)  Signed, Erma Heritage, PA-C  07/30/2022 2:14 PM    [place]

## 2022-07-30 NOTE — Telephone Encounter (Signed)
Pt c/o medication issue:  1. Name of Medication: potassium chloride SA (KLOR-CON M) 20 MEQ tablet   2. How are you currently taking this medication (dosage and times per day)? As prescribed   3. Are you having a reaction (difficulty breathing--STAT)? Lethargic some days   4. What is your medication issue? Pt daughters says she doesn't know if she should keep appt. She says this medication was added at last appt and she doesn't feel well today due to being really tired.  Requesting call back to see if they should keep this appt today or reschedule. Requesting call back.

## 2022-08-25 ENCOUNTER — Telehealth: Payer: Self-pay | Admitting: Cardiology

## 2022-08-25 NOTE — Telephone Encounter (Signed)
Pt c/o medication issue:  1. Name of Medication: furosemide (LASIX) 40 MG tablet   2. How are you currently taking this medication (dosage and times per day)? Take 1.5 tablets (60 mg total) by mouth daily.   3. Are you having a reaction (difficulty breathing--STAT)? No   4. What is your medication issue? Patient would like to reduce her dosage from 1.5 tablets to 1 tablet

## 2022-08-25 NOTE — Telephone Encounter (Signed)
Patient made aware and verbalized understanding. States that she will contact the office if she experiences any other chest pains.

## 2022-08-25 NOTE — Telephone Encounter (Signed)
Patient states that she has been taking furosemide 40 mg, 1.5 tablets daily. States that the swelling has gone down tremendously and would like to go back to taking only one tablet daily because she is going to the bathroom a lot and feels that she does not need to take as much.  States that she had valve replacement surgery on 8/29 and has been feeling great since then but states that on 11/27, she started having chest pains that caused her to take 3 nitroglycerin tablets. States that after taking, she felt much better and was able to go to sleep. Wants to know if this is something that should be expected or was the surgery supposed to prevent this from happening.  Please advise

## 2022-09-07 DIAGNOSIS — Z0001 Encounter for general adult medical examination with abnormal findings: Secondary | ICD-10-CM | POA: Diagnosis not present

## 2022-09-07 DIAGNOSIS — E119 Type 2 diabetes mellitus without complications: Secondary | ICD-10-CM | POA: Diagnosis not present

## 2022-09-07 DIAGNOSIS — M19012 Primary osteoarthritis, left shoulder: Secondary | ICD-10-CM | POA: Diagnosis not present

## 2022-09-07 DIAGNOSIS — Z993 Dependence on wheelchair: Secondary | ICD-10-CM | POA: Diagnosis not present

## 2022-09-07 DIAGNOSIS — E039 Hypothyroidism, unspecified: Secondary | ICD-10-CM | POA: Diagnosis not present

## 2022-09-07 DIAGNOSIS — E782 Mixed hyperlipidemia: Secondary | ICD-10-CM | POA: Diagnosis not present

## 2022-09-07 DIAGNOSIS — E559 Vitamin D deficiency, unspecified: Secondary | ICD-10-CM | POA: Diagnosis not present

## 2022-09-28 DIAGNOSIS — R5381 Other malaise: Secondary | ICD-10-CM | POA: Diagnosis not present

## 2022-09-28 DIAGNOSIS — R69 Illness, unspecified: Secondary | ICD-10-CM | POA: Diagnosis not present

## 2022-10-11 ENCOUNTER — Telehealth: Payer: Self-pay | Admitting: Cardiology

## 2022-10-11 NOTE — Telephone Encounter (Signed)
Patient described clear weeping from area on leg,unaware if she bumped leg on anything. Currently dry.I advised her to not use alcohol, betadine,or peroxide on area. Plain soap and water. Call back if area becomes red or swollen or begins to drain again.

## 2022-10-11 NOTE — Telephone Encounter (Signed)
Pt states she has a whole in her leg that is leaking clear fluid. She states she put a bandage on it and put alcohol on it. She is unsure what has caused it because she hasn't hit it on anything. Please advise

## 2022-11-02 DIAGNOSIS — L03116 Cellulitis of left lower limb: Secondary | ICD-10-CM | POA: Diagnosis not present

## 2022-11-02 DIAGNOSIS — S80812A Abrasion, left lower leg, initial encounter: Secondary | ICD-10-CM | POA: Diagnosis not present

## 2022-11-03 DIAGNOSIS — L03116 Cellulitis of left lower limb: Secondary | ICD-10-CM | POA: Diagnosis not present

## 2022-11-04 DIAGNOSIS — L03116 Cellulitis of left lower limb: Secondary | ICD-10-CM | POA: Diagnosis not present

## 2022-11-05 DIAGNOSIS — L03116 Cellulitis of left lower limb: Secondary | ICD-10-CM | POA: Diagnosis not present

## 2022-11-16 ENCOUNTER — Ambulatory Visit: Payer: 59 | Attending: Cardiology | Admitting: Cardiology

## 2022-11-16 ENCOUNTER — Encounter: Payer: Self-pay | Admitting: Cardiology

## 2022-11-16 VITALS — BP 144/86 | HR 68 | Ht 69.5 in | Wt 200.0 lb

## 2022-11-16 DIAGNOSIS — I447 Left bundle-branch block, unspecified: Secondary | ICD-10-CM | POA: Diagnosis not present

## 2022-11-16 DIAGNOSIS — Z952 Presence of prosthetic heart valve: Secondary | ICD-10-CM | POA: Diagnosis not present

## 2022-11-16 DIAGNOSIS — R600 Localized edema: Secondary | ICD-10-CM | POA: Diagnosis not present

## 2022-11-16 NOTE — Patient Instructions (Signed)
Medication Instructions:  Your physician recommends that you continue on your current medications as directed. Please refer to the Current Medication list given to you today.   Labwork: None today  Testing/Procedures: None today  Follow-Up: 6 months  Any Other Special Instructions Will Be Listed Below (If Applicable).  If you need a refill on your cardiac medications before your next appointment, please call your pharmacy.  

## 2022-11-16 NOTE — Progress Notes (Signed)
Cardiology Office Note  Date: 11/16/2022   ID: Tinishia, Balek 1932-10-04, MRN [id number]  PCP:  Sharilyn Sites, MD  Cardiologist:  Rozann Lesches, MD Electrophysiologist:  None   Chief Complaint  Patient presents with   Cardiac follow-up    History of Present Illness: [name] is an 89 y.o. female last assessed via telehealth encounter in November 2023 by Ms. Strader PA-C, I reviewed the note.  She is here for a routine visit.  Still lives at home, her daughter helps by food and do other shopping.  She still cooks for herself and does other ADLs.  She reports NYHA class II dyspnea, no chest pain or palpitations, no syncope.  I reviewed her medications today, she reports compliance with therapy.  Remains on Lasix with potassium supplement, no nitroglycerin use.  Interval lab work in December 2023 showed normal renal function and potassium.  Echocardiogram from October 2023 revealed stable TAVR function with mean gradient 10 mmHg and no significant aortic regurgitation.   Past Medical History:  Diagnosis Date   Anxiety    Arthritis    CAD (coronary artery disease)    Mild at cardiac catheterization 2018   Chronic bronchitis    Constipation    Depression    Hyperlipidemia    Hypothyroidism    S/P TAVR (transcatheter aortic valve replacement) 05/25/2022   14m S3UR via TF approach with Dr. MAngelena Formand Dr. BCyndia Bent  Severe aortic stenosis     Current Outpatient Medications  Medication Sig Dispense Refill   furosemide (LASIX) 40 MG tablet Take 1.5 tablets (60 mg total) by mouth daily. 180 tablet 3   ibuprofen (ADVIL,MOTRIN) 200 MG tablet Take 600 mg by mouth every 8 (eight) hours as needed for moderate pain (shoulder pain).     levothyroxine (SYNTHROID) 125 MCG tablet Take 125 mcg by mouth daily before breakfast.     nitroGLYCERIN (NITROSTAT) 0.4 MG SL tablet Place 1 tablet (0.4 mg total) under the tongue every 5 (five) minutes as needed for chest pain. 25  tablet 3   oxybutynin (DITROPAN XL) 15 MG 24 hr tablet Take 15 mg by mouth daily.     PARoxetine (PAXIL) 20 MG tablet Take 20 mg by mouth daily.     potassium chloride SA (KLOR-CON M) 20 MEQ tablet Take 1.5 tablets (30 mEq total) by mouth daily. 90 tablet 3   No current facility-administered medications for this visit.   Allergies:  Patient has no active allergies.   ROS: No orthopnea or PND.  Physical Exam: VS:  BP (!) 144/86   Pulse 68   Ht 5' 9.5" (1.765 m)   Wt 200 lb (90.7 kg)   SpO2 92%   BMI 29.11 kg/m , BMI Body mass index is 29.11 kg/m.  Wt Readings from Last 3 Encounters:  11/16/22 200 lb (90.7 kg)  06/02/22 200 lb (90.7 kg)  05/26/22 202 lb 13.2 oz (92 kg)    General: Patient appears comfortable at rest. HEENT: Conjunctiva and lids normal. Neck: Supple, no elevated JVP or carotid bruits. Lungs: Clear to auscultation, nonlabored breathing at rest. Cardiac: Regular rate and rhythm, no S3, 2/6 systolic murmur.  ECG:  An ECG dated 06/02/2022 was personally reviewed today and demonstrated:  Sinus arrhythmia with left bundle branch block.  Recent Labwork: 05/18/2022: ALT 15; AST 23; B Natriuretic Peptide 415.3 05/26/2022: Hemoglobin 11.2; Magnesium 1.9; Platelets 254 06/30/2022: BUN 17; Creatinine, Ser 1.01; Potassium 3.5; Sodium 144  December 2023: Hemoglobin 12.8,  platelets 215, BUN 18, creatinine 0.91, potassium 4.3, AST 27, ALT 9, cholesterol 225, triglycerides 105, HDL 54, LDL 152, TSH 13.2  Other Studies Reviewed Today:  Echocardiogram 06/30/2022:  1. Left ventricular ejection fraction, by estimation, is 65 to 70%. The  left ventricle has normal function. The left ventricle has no regional  wall motion abnormalities. There is moderate asymmetric left ventricular  hypertrophy. Left ventricular  diastolic parameters are consistent with Grade I diastolic dysfunction  (impaired relaxation). Elevated left ventricular end-diastolic pressure.   2. Right ventricular  systolic function is normal. The right ventricular  size is normal. There is normal pulmonary artery systolic pressure.   3. Left atrial size was moderately dilated.   4. The mitral valve is grossly normal. Trivial mitral valve  regurgitation. No evidence of mitral stenosis. Moderate mitral annular  calcification.   5. Mean gradient improved since prior, was 20 mmHg on 05/26/22. The aortic  valve has been repaired/replaced. Aortic valve regurgitation is not  visualized. There is a 26 mm Edwards Sapien prosthetic (TAVR) valve  present in the aortic position. Procedure  Date: 05/25/2022. Echo findings are consistent with normal structure and  function of the aortic valve prosthesis. Aortic valve mean gradient  measures 10.0 mmHg.   6. The inferior vena cava is normal in size with greater than 50%  respiratory variability, suggesting right atrial pressure of 3 mmHg.   Cardiac monitor October 2023: ZIO AT.  13 days, 22 hours analyzed.   Predominant rhythm is sinus with IVCD.  Heart rate ranged from 39 bpm up to 124 bpm and average heart rate 74 bpm. There were rare PACs including atrial couplets and triplets representing less than 1% total beats. There were rare PVCs representing less than 1% total beats.  Also limited episode of ventricular trigeminy. Multiple, brief episodes of PSVT were noted, the longest of which lasted 15 beats with average heart rate 114 bpm. There were no pauses. Patient triggered episodes and reported chest pain generally correlated with sinus rhythm.  Assessment and Plan:  1.  History of severe aortic stenosis status post TAVR in August 2023, 26 mm Edwards SAPIEN 3 THV.  Echocardiogram from October 2023 revealed normal prosthetic function with mean gradient 10 mmHg and no significant paravalvular regurgitation.  2.  Leg edema, continues on Lasix with potassium supplement with reasonable control.  Follow-up lab work in December 2023 showed normal renal function and  potassium.  No changes were made today.  3.  Left bundle branch block following TAVR, asymptomatic and with cardiac monitor from October 2023 showing no significant pauses or heart block.  She is currently not on any AV nodal blockers.  Medication Adjustments/Labs and Tests Ordered: Current medicines are reviewed at length with the patient today.  Concerns regarding medicines are outlined above.   Tests Ordered: No orders of the defined types were placed in this encounter.   Medication Changes: No orders of the defined types were placed in this encounter.   Disposition:  Follow up  6 months.  Signed, Satira Sark, MD, [facility] 11/16/2022 4:49 PM    [place] at [facility] [address], [place], [place] [address] Phone: [phone]; Fax: [phone]

## 2022-11-17 DIAGNOSIS — W19XXXA Unspecified fall, initial encounter: Secondary | ICD-10-CM | POA: Diagnosis not present

## 2022-11-17 DIAGNOSIS — T1490XA Injury, unspecified, initial encounter: Secondary | ICD-10-CM | POA: Diagnosis not present

## 2022-11-17 DIAGNOSIS — R5381 Other malaise: Secondary | ICD-10-CM | POA: Diagnosis not present

## 2022-11-23 DIAGNOSIS — L03116 Cellulitis of left lower limb: Secondary | ICD-10-CM | POA: Diagnosis not present

## 2022-11-23 DIAGNOSIS — L03115 Cellulitis of right lower limb: Secondary | ICD-10-CM | POA: Diagnosis not present

## 2022-11-23 DIAGNOSIS — S80812A Abrasion, left lower leg, initial encounter: Secondary | ICD-10-CM | POA: Diagnosis not present

## 2022-12-03 ENCOUNTER — Ambulatory Visit (HOSPITAL_COMMUNITY): Payer: 59 | Attending: Family Medicine | Admitting: Physical Therapy

## 2022-12-03 ENCOUNTER — Other Ambulatory Visit: Payer: Self-pay

## 2022-12-03 DIAGNOSIS — M79661 Pain in right lower leg: Secondary | ICD-10-CM | POA: Insufficient documentation

## 2022-12-03 NOTE — Therapy (Signed)
OUTPATIENT PHYSICAL THERAPY NEURO EVALUATION   Patient Name: [name] MRN: [id number] DOB:December 07, 1932, 89 y.o., female Today's Date: 12/03/2022   PCP: Sharilyn Sites  REFERRING PROVIDER: Sharilyn Sites   END OF SESSION:  PT End of Session - 12/03/22 0953     Visit Number 1    Number of Visits 1    Authorization Type [employer]    PT Start Time 0903    PT Stop Time 0949    PT Time Calculation (min) 46 min    Activity Tolerance Patient tolerated treatment well    Behavior During Therapy [facility] for tasks assessed/performed             Past Medical History:  Diagnosis Date   Anxiety    Arthritis    CAD (coronary artery disease)    Mild at cardiac catheterization 2018   Chronic bronchitis    Constipation    Depression    Hyperlipidemia    Hypothyroidism    S/P TAVR (transcatheter aortic valve replacement) 05/25/2022   80m S3UR via TF approach with Dr. MAngelena Formand Dr. BCyndia Bent  Severe aortic stenosis    Past Surgical History:  Procedure Laterality Date   BUNIONECTOMY Left 1975   CARDIAC CATHETERIZATION  07/04/2020   CHOLECYSTECTOMY N/A 02/15/2017   Procedure: LAPAROSCOPIC CHOLECYSTECTOMY;  Surgeon: RRalene Ok MD;  [facility]  Service: General;  Laterality: N/A;   CORONARY ANGIOGRAPHY N/A 02/14/2017   Procedure: Coronary Angiography;  Surgeon: ENelva Bush MD;  [facility];  Service: Cardiovascular;  Laterality: N/A;   EYE SURGERY  2009   bilateral   HEMATOMA EVACUATION  06/04/2011   Procedure: EVACUATION HEMATOMA;  Surgeon: SArther Abbott MD;  Location: [facility];  Service: Orthopedics;  Laterality: Right;  evacuation of seroma   INTRAOPERATIVE TRANSTHORACIC ECHOCARDIOGRAM N/A 05/25/2022   Procedure: INTRAOPERATIVE TRANSTHORACIC ECHOCARDIOGRAM;  Surgeon: MBurnell Blanks MD;  [place] [facility];  Service: Open Heart Surgery;  Laterality: N/A;   JOINT REPLACEMENT  05/2010   left total knee Dr. HAline Brochure  PATELLAR  TENDON REPAIR  06/04/2011   Procedure: PATELLA TENDON REPAIR;  Surgeon: SArther Abbott MD;  Location: [facility];  Service: Orthopedics;  Laterality: Right;   PATELLAR TENDON REPAIR  08/30/2011   Procedure: PATELLA TENDON REPAIR;  Surgeon: SArther Abbott MD;  Location: [facility];  Service: Orthopedics;  Laterality: Right;  Allograft Reconstrucation Right Patella Tendon   RIGHT/LEFT HEART CATH AND CORONARY ANGIOGRAPHY N/A 07/04/2020   Procedure: RIGHT/LEFT HEART CATH AND CORONARY ANGIOGRAPHY;  Surgeon: MBurnell Blanks MD;  [place] [facility];  Service: Cardiovascular;  Laterality: N/A;   TOTAL HIP ARTHROPLASTY Left 1998   TOTAL KNEE ARTHROPLASTY  05/03/2011   Procedure: TOTAL KNEE ARTHROPLASTY;  Surgeon: SArther Abbott MD;  Location: [facility];  Service: Orthopedics;  Laterality: Right;  With DePuy   TOTAL [facility]YROIDECT[facility]  11/2009   [facility]  TRANSCATHETER AORTIC VALVE REPLACEMENT, TRANSFEMORAL Right 05/25/2022   Procedure: Transcatheter Aortic Valve Replacement, Transfemoral;  Surgeon: MBurnell Blanks MD;  [place] [facility];  Service: Open Heart Surgery;  Laterality: Right;   TUBAL LIGATION     Patient Active Problem List   Diagnosis Date Noted   S/P TAVR (transcatheter aortic valve replacement) 05/25/2022   Nausea and vomiting 05/18/2022   Severe aortic stenosis    Depression 02/12/2017   Anxiety [id number]  Biliary colic    Hypothyroidism    Hyperlipidemia  KNEE, ARTHRITIS, DEGEN./OSTEO 05/14/2010    ONSET DATE: 11/05/22  REFERRING DIAG: B LE wounds /cellulitis   THERAPY DIAG:  Pain in right lower leg  Rationale for Evaluation and Treatment: Rehabilitation     Wound Therapy - 12/03/22 0001     Subjective Pt states that about four weeks ago she noted that her dog was licking her right leg.  She looked down and noticed that she had a wound that was leaking significantly.  She typically does not go to the MD right away so she tried to  treat it herself for about two weeks but the wound kept looking worse.  She went to the MD who gave her a shot of recephin  four different times as well as a prescription on amoxicillian  and mupircin ointment.  The wound healed and a few days later she noted that a wound was on her Lt leg.  The MD stated that he could only give her one more shot of the Recephin and renewed her prescriptions which did the trick.  She comes to the department today, on her evaluation, wound free with no drainage from either LE.    Patient and Family Stated Goals keep this from happening again    Date of Onset 11/05/22    Prior Treatments --   see above   Pain Scale --   none but pt states that both of her LE are numb.   Evaluation and Treatment Procedures Explained to Patient/Family Yes    Evaluation and Treatment Procedures agreed to    Wound Properties Date First Assessed: 12/03/22 Time First Assessed: 0915 Wound Type: -- , wound healed still red from cellulitis  Location: Leg Location Orientation: Right;Left;Lower Present on Admission: Yes Final Assessment Date: 12/03/22 Final Assessment Time: 0950   Wound Image Images linked: 1    Dressing Type None    Treatment Cleansed;Other (Comment)   moisturized and educated on cleansing and moisturizing daily.              PATIENT EDUCATION: Education details: The importance of cleansing with dial Gold soap daily with daily moisturizing using a good lotion such as Aquaphor or Eucerin Person educated: Patient Education method: Customer service manager Education comprehension: verbalized understanding   HOME EXERCISE PROGRAM: Ankle pump, LAQ, hip ab/adduction, marching and diaphragm breathing   GOALS: Goals reviewed with patient? Yes  SHORT TERM GOALS: Target date: 12/01/2022  PT to be I in exercises to improve circulation to her feet.  Baseline: Goal status: INITIAL  2.  PT to be cleansing and moisturizing her LE daily to assist in preventing the  return of cellulitis.  Baseline:  Goal status: INITIAL  ASSESSMENT:  CLINICAL IMPRESSION: Patient is a 89 y.o. female who was seen today for physical therapy evaluation and treatment for B wounds with cellulitis. The pt has had 5 shots of antibiotics, 2 rounds of oral antibiotics and has been using antibacterial cream since she was referred to PT; the wounds are healed and the drainage has stopped.  The pt has had B TKR as well as a patellar tendon repair which has left her wheelchair bound with decreased mobility.  The pt lives alone.  Both legs are dry, and red with noted increased swelling.  The pt recently had a heart stent and has hx of CAD.  The therapists educated pt on the importance of cleansing and moisturizing  her legs on a daily basis as both of her legs have a significant amount of  dirt on them and both are dry.  The therapist recommended that the pt acquires two long handled sponges to assist in the task of cleansing and moisturizing as her mobility prevents her from being able to reach down to her lower legs.  The therapist also gave the pt exercises that would promote circulation to her LE as she states both LE are completely numb.  Ms. Debarge will no longer need skilled therapy for this dx.    OBJECTIVE IMPAIRMENTS:  decreased skin integrity  .   ACTIVITY LIMITATIONS: bathing, dressing, and hygiene/grooming    PERSONAL FACTORS: Age, Fitness, and 3+ comorbidities: CAD, TKR, patellar tendon repair  are also affecting patient's functional outcome.   REHAB POTENTIAL: Good  CLINICAL DECISION MAKING: Stable/uncomplicated  EVALUATION COMPLEXITY: Low  PLAN: PT FREQUENCY: 1x/week  PT DURATION: 1 week  PLANNED INTERVENTIONS: Therapeutic exercises, Patient/Family education, and Self Care  PLAN FOR NEXT SESSION: N/A  Rayetta Humphrey, PT CLT [phone]  12/03/2022, 10:23 AM

## 2022-12-15 ENCOUNTER — Ambulatory Visit (HOSPITAL_COMMUNITY): Payer: 59 | Admitting: Physical Therapy

## 2022-12-17 ENCOUNTER — Ambulatory Visit (HOSPITAL_COMMUNITY): Payer: 59 | Admitting: Physical Therapy

## 2022-12-20 ENCOUNTER — Ambulatory Visit (HOSPITAL_COMMUNITY): Payer: 59 | Admitting: Physical Therapy

## 2022-12-22 ENCOUNTER — Ambulatory Visit (HOSPITAL_COMMUNITY): Payer: 59 | Admitting: Physical Therapy

## 2022-12-24 ENCOUNTER — Ambulatory Visit (HOSPITAL_COMMUNITY): Payer: 59 | Admitting: Physical Therapy

## 2022-12-27 ENCOUNTER — Ambulatory Visit (HOSPITAL_COMMUNITY): Payer: 59 | Admitting: Physical Therapy

## 2022-12-29 ENCOUNTER — Ambulatory Visit (HOSPITAL_COMMUNITY): Payer: 59 | Admitting: Physical Therapy

## 2023-01-03 ENCOUNTER — Ambulatory Visit (HOSPITAL_COMMUNITY): Payer: 59 | Admitting: Physical Therapy

## 2023-01-05 ENCOUNTER — Ambulatory Visit (HOSPITAL_COMMUNITY): Payer: 59 | Admitting: Physical Therapy

## 2023-01-06 ENCOUNTER — Encounter (HOSPITAL_COMMUNITY): Payer: Self-pay | Admitting: Family Medicine

## 2023-01-06 ENCOUNTER — Other Ambulatory Visit: Payer: Self-pay

## 2023-01-06 ENCOUNTER — Emergency Department (HOSPITAL_COMMUNITY): Payer: 59

## 2023-01-06 ENCOUNTER — Inpatient Hospital Stay (HOSPITAL_COMMUNITY)
Admission: EM | Admit: 2023-01-06 | Discharge: 2023-01-08 | DRG: 603 | Disposition: A | Discharge status: Home Health | Payer: 59 | Attending: Family Medicine | Admitting: Family Medicine

## 2023-01-06 DIAGNOSIS — Z952 Presence of prosthetic heart valve: Secondary | ICD-10-CM | POA: Diagnosis not present

## 2023-01-06 DIAGNOSIS — M7989 Other specified soft tissue disorders: Secondary | ICD-10-CM | POA: Diagnosis not present

## 2023-01-06 DIAGNOSIS — I5032 Chronic diastolic (congestive) heart failure: Secondary | ICD-10-CM | POA: Diagnosis not present

## 2023-01-06 DIAGNOSIS — L8990 Pressure ulcer of unspecified site, unspecified stage: Secondary | ICD-10-CM | POA: Diagnosis present

## 2023-01-06 DIAGNOSIS — S81802A Unspecified open wound, left lower leg, initial encounter: Secondary | ICD-10-CM

## 2023-01-06 DIAGNOSIS — Z79890 Hormone replacement therapy (postmenopausal): Secondary | ICD-10-CM

## 2023-01-06 DIAGNOSIS — E785 Hyperlipidemia, unspecified: Secondary | ICD-10-CM | POA: Diagnosis present

## 2023-01-06 DIAGNOSIS — F419 Anxiety disorder, unspecified: Secondary | ICD-10-CM | POA: Diagnosis present

## 2023-01-06 DIAGNOSIS — L89152 Pressure ulcer of sacral region, stage 2: Secondary | ICD-10-CM | POA: Diagnosis not present

## 2023-01-06 DIAGNOSIS — R609 Edema, unspecified: Secondary | ICD-10-CM | POA: Diagnosis not present

## 2023-01-06 DIAGNOSIS — I110 Hypertensive heart disease with heart failure: Secondary | ICD-10-CM | POA: Diagnosis not present

## 2023-01-06 DIAGNOSIS — Z87891 Personal history of nicotine dependence: Secondary | ICD-10-CM

## 2023-01-06 DIAGNOSIS — Z96653 Presence of artificial knee joint, bilateral: Secondary | ICD-10-CM | POA: Diagnosis present

## 2023-01-06 DIAGNOSIS — L308 Other specified dermatitis: Secondary | ICD-10-CM | POA: Diagnosis not present

## 2023-01-06 DIAGNOSIS — L03115 Cellulitis of right lower limb: Secondary | ICD-10-CM | POA: Diagnosis not present

## 2023-01-06 DIAGNOSIS — E876 Hypokalemia: Secondary | ICD-10-CM | POA: Diagnosis present

## 2023-01-06 DIAGNOSIS — L304 Erythema intertrigo: Secondary | ICD-10-CM | POA: Diagnosis present

## 2023-01-06 DIAGNOSIS — Z9049 Acquired absence of other specified parts of digestive tract: Secondary | ICD-10-CM

## 2023-01-06 DIAGNOSIS — Z79899 Other long term (current) drug therapy: Secondary | ICD-10-CM | POA: Diagnosis not present

## 2023-01-06 DIAGNOSIS — M25569 Pain in unspecified knee: Secondary | ICD-10-CM | POA: Diagnosis present

## 2023-01-06 DIAGNOSIS — G629 Polyneuropathy, unspecified: Secondary | ICD-10-CM | POA: Diagnosis not present

## 2023-01-06 DIAGNOSIS — E890 Postprocedural hypothyroidism: Secondary | ICD-10-CM | POA: Diagnosis present

## 2023-01-06 DIAGNOSIS — I10 Essential (primary) hypertension: Secondary | ICD-10-CM | POA: Diagnosis not present

## 2023-01-06 DIAGNOSIS — R7982 Elevated C-reactive protein (CRP): Secondary | ICD-10-CM | POA: Diagnosis present

## 2023-01-06 DIAGNOSIS — Z7409 Other reduced mobility: Secondary | ICD-10-CM | POA: Diagnosis present

## 2023-01-06 DIAGNOSIS — T501X5A Adverse effect of loop [high-ceiling] diuretics, initial encounter: Secondary | ICD-10-CM | POA: Diagnosis not present

## 2023-01-06 DIAGNOSIS — I878 Other specified disorders of veins: Secondary | ICD-10-CM | POA: Diagnosis not present

## 2023-01-06 DIAGNOSIS — R9431 Abnormal electrocardiogram [ECG] [EKG]: Secondary | ICD-10-CM | POA: Diagnosis not present

## 2023-01-06 DIAGNOSIS — L089 Local infection of the skin and subcutaneous tissue, unspecified: Secondary | ICD-10-CM | POA: Diagnosis not present

## 2023-01-06 DIAGNOSIS — F329 Major depressive disorder, single episode, unspecified: Secondary | ICD-10-CM | POA: Diagnosis present

## 2023-01-06 DIAGNOSIS — R58 Hemorrhage, not elsewhere classified: Secondary | ICD-10-CM | POA: Diagnosis not present

## 2023-01-06 DIAGNOSIS — R0902 Hypoxemia: Secondary | ICD-10-CM | POA: Diagnosis not present

## 2023-01-06 DIAGNOSIS — L0390 Cellulitis, unspecified: Secondary | ICD-10-CM | POA: Diagnosis present

## 2023-01-06 DIAGNOSIS — R000 Tachycardia, unspecified: Secondary | ICD-10-CM | POA: Diagnosis not present

## 2023-01-06 LAB — COMPREHENSIVE METABOLIC PANEL
ALT: 13 U/L (ref 0–44)
AST: 28 U/L (ref 15–41)
Albumin: 3.2 g/dL — ABNORMAL LOW (ref 3.5–5.0)
Alkaline Phosphatase: 76 U/L (ref 38–126)
Anion gap: 10 (ref 5–15)
BUN: 25 mg/dL — ABNORMAL HIGH (ref 8–23)
CO2: 28 mmol/L (ref 22–32)
Calcium: 8.5 mg/dL — ABNORMAL LOW (ref 8.9–10.3)
Chloride: 101 mmol/L (ref 98–111)
Creatinine, Ser: 1.04 mg/dL — ABNORMAL HIGH (ref 0.44–1.00)
GFR, Estimated: 52 mL/min — ABNORMAL LOW (ref >60)
Glucose, Bld: 82 mg/dL (ref 70–99)
Potassium: 3.1 mmol/L — ABNORMAL LOW (ref 3.5–5.1)
Sodium: 139 mmol/L (ref 135–145)
Total Bilirubin: 0.6 mg/dL (ref 0.3–1.2)
Total Protein: 6.1 g/dL — ABNORMAL LOW (ref 6.5–8.1)

## 2023-01-06 LAB — CBC WITH DIFFERENTIAL/PLATELET
Abs Immature Granulocytes: 0.03 10*3/uL (ref 0.00–0.07)
Basophils Absolute: 0.1 10*3/uL (ref 0.0–0.1)
Basophils Relative: 1 %
Eosinophils Absolute: 0.3 10*3/uL (ref 0.0–0.5)
Eosinophils Relative: 4 %
HCT: 34 % — ABNORMAL LOW (ref 36.0–46.0)
Hemoglobin: 11.1 g/dL — ABNORMAL LOW (ref 12.0–15.0)
Immature Granulocytes: 0 %
Lymphocytes Relative: 16 %
Lymphs Abs: 1.4 10*3/uL (ref 0.7–4.0)
MCH: 28.7 pg (ref 26.0–34.0)
MCHC: 32.6 g/dL (ref 30.0–36.0)
MCV: 87.9 fL (ref 80.0–100.0)
Monocytes Absolute: 0.6 10*3/uL (ref 0.1–1.0)
Monocytes Relative: 7 %
Neutro Abs: 5.9 10*3/uL (ref 1.7–7.7)
Neutrophils Relative %: 72 %
Platelets: 270 10*3/uL (ref 150–400)
RBC: 3.87 MIL/uL (ref 3.87–5.11)
RDW: 13.8 % (ref 11.5–15.5)
WBC: 8.3 10*3/uL (ref 4.0–10.5)
nRBC: 0 % (ref 0.0–0.2)

## 2023-01-06 LAB — SEDIMENTATION RATE: Sed Rate: 44 mm/hr — ABNORMAL HIGH (ref 0–22)

## 2023-01-06 LAB — BRAIN NATRIURETIC PEPTIDE: B Natriuretic Peptide: 170.6 pg/mL — ABNORMAL HIGH (ref 0.0–100.0)

## 2023-01-06 LAB — C-REACTIVE PROTEIN: CRP: 3.4 mg/dL — ABNORMAL HIGH (ref <1.0)

## 2023-01-06 MED ORDER — VANCOMYCIN HCL 750 MG/150ML IV SOLN: 750.0000 mg | INTRAVENOUS | Status: DC

## 2023-01-06 MED ORDER — CLOTRIMAZOLE 1 % EX CREA
TOPICAL_CREAM | Freq: Two times a day (BID) | CUTANEOUS | Status: DC
  Filled 2023-01-06: qty 15

## 2023-01-06 MED ORDER — VANCOMYCIN HCL 2000 MG/400ML IV SOLN
2000.0000 mg | Freq: Once | INTRAVENOUS | Status: AC
  Administered 2023-01-06: 2000 mg via INTRAVENOUS
  Filled 2023-01-06: qty 400

## 2023-01-06 MED ORDER — OXYBUTYNIN CHLORIDE ER 5 MG PO TB24: 15.0000 mg | ORAL_TABLET | Freq: Every day | ORAL | Status: DC

## 2023-01-06 MED ORDER — FUROSEMIDE 40 MG PO TABS
60.0000 mg | ORAL_TABLET | Freq: Every day | ORAL | Status: DC
  Administered 2023-01-07 – 2023-01-08 (×2): 60 mg via ORAL
  Filled 2023-01-06 (×2): qty 1

## 2023-01-06 MED ORDER — VANCOMYCIN HCL 1500 MG/300ML IV SOLN
1500.0000 mg | Freq: Once | INTRAVENOUS | Status: AC
  Administered 2023-01-06: 1500 mg via INTRAVENOUS
  Filled 2023-01-06: qty 300

## 2023-01-06 MED ORDER — ENOXAPARIN SODIUM 40 MG/0.4ML IJ SOSY
40.0000 mg | PREFILLED_SYRINGE | INTRAMUSCULAR | Status: DC
  Administered 2023-01-06 – 2023-01-07 (×2): 40 mg via SUBCUTANEOUS
  Filled 2023-01-06 (×2): qty 0.4

## 2023-01-06 MED ORDER — PAROXETINE HCL 20 MG PO TABS
20.0000 mg | ORAL_TABLET | Freq: Every day | ORAL | Status: DC
  Administered 2023-01-07 – 2023-01-08 (×2): 20 mg via ORAL
  Filled 2023-01-06 (×2): qty 1

## 2023-01-06 MED ORDER — ACETAMINOPHEN 325 MG PO TABS
650.0000 mg | ORAL_TABLET | Freq: Four times a day (QID) | ORAL | Status: DC | PRN
  Administered 2023-01-06 – 2023-01-08 (×3): 650 mg via ORAL
  Filled 2023-01-06 (×3): qty 2

## 2023-01-06 MED ORDER — LEVOTHYROXINE SODIUM 25 MCG PO TABS: 125.0000 ug | ORAL_TABLET | Freq: Every day | ORAL | Status: DC

## 2023-01-06 MED ORDER — LEVOTHYROXINE SODIUM 75 MCG PO TABS
150.0000 ug | ORAL_TABLET | Freq: Every day | ORAL | Status: DC
  Administered 2023-01-07 – 2023-01-08 (×2): 150 ug via ORAL
  Filled 2023-01-06 (×2): qty 2

## 2023-01-06 MED ORDER — NYSTATIN 100000 UNIT/GM EX POWD: Freq: Three times a day (TID) | CUTANEOUS | Status: DC

## 2023-01-06 MED ORDER — POTASSIUM CHLORIDE 20 MEQ PO PACK
20.0000 meq | PACK | Freq: Once | ORAL | Status: AC
  Administered 2023-01-06: 20 meq via ORAL
  Filled 2023-01-06: qty 1

## 2023-01-06 MED ORDER — ACETAMINOPHEN 650 MG RE SUPP: 650.0000 mg | Freq: Four times a day (QID) | RECTAL | Status: DC | PRN

## 2023-01-06 NOTE — ED Notes (Signed)
Please update family [phone]

## 2023-01-06 NOTE — Assessment & Plan Note (Addendum)
Significant intertrigo beneath breasts and pannus.  - Interdry to bilateral breasts and pannus as follows: Clean areas with soap and water, dry thoroughly. DO NOT PLACE ANY POWDERS OR LOTIONS IN THESE AREAS. Instead place Interdry AG [place] [phone]) - Additional details in wound care note.

## 2023-01-06 NOTE — ED Notes (Signed)
ED TO INPATIENT HANDOFF REPORT  ED Nurse Name and Phone #: Morrie Sheldon RN [phone]  S Name/Age/Gender [name] 89 y.o. female Room/Bed: 017C/017C  Code Status   Code Status: Full Code  Home/SNF/Other Home Patient oriented to: self, place, time, and situation Is this baseline? Yes   Triage Complete: Triage complete  Chief Complaint Cellulitis [L03.90]  Triage Note Pt BIB EMS from home, c/o toe bleeding. Pt has obvious skin breakdown under both breasts and abdomen. Legs are weeping with sores, red, and 2+ pitting edema. Pt says weeping has been ongoing for 5 weeks and taking amoxicillin. Aox4.    Allergies No Active Allergies  Level of Care/Admitting Diagnosis ED Disposition     ED Disposition  Admit   Condition  --   Comment  Hospital Area: [name] [[id number]]  Level of Care: Med-Surg [16]  May [facility] patient in observation at [facility] or [facility] if equivalent level of care is available:: No  Covid Evaluation: Asymptomatic - no recent exposure (last 10 days) testing not required  Diagnosis: Cellulitis [116579]  Admitting Physician: Lockie Mola [[id number]]  Attending Physician: Latrelle Dodrill [[id number]]          B Medical/Surgery History Past Medical History:  Diagnosis Date   Anxiety    Arthritis    CAD (coronary artery disease)    Mild at cardiac catheterization 2018   Chronic bronchitis    Constipation    Depression    Hyperlipidemia    Hypothyroidism    S/P TAVR (transcatheter aortic valve replacement) 05/25/2022   50mm S3UR via TF approach with Dr. Clifton Zandon Talton and Dr. Laneta Simmers   Severe aortic stenosis    Past Surgical History:  Procedure Laterality Date   BUNIONECTOMY Left 1975   CARDIAC CATHETERIZATION  07/04/2020   CHOLECYSTECTOMY N/A 02/15/2017   Procedure: LAPAROSCOPIC CHOLECYSTECTOMY;  Surgeon: Axel Filler, MD;  Location: [facility];  Service: General;  Laterality: N/A;   CORONARY ANGIOGRAPHY N/A 02/14/2017    Procedure: Coronary Angiography;  Surgeon: Yvonne Kendall, MD;  [facility];  Service: Cardiovascular;  Laterality: N/A;   EYE SURGERY  2009   bilateral   HEMATOMA EVACUATION  06/04/2011   Procedure: EVACUATION HEMATOMA;  Surgeon: Fuller Canada, MD;  Location: [facility];  Service: Orthopedics;  Laterality: Right;  evacuation of seroma   INTRAOPERATIVE TRANSTHORACIC ECHOCARDIOGRAM N/A 05/25/2022   Procedure: INTRAOPERATIVE TRANSTHORACIC ECHOCARDIOGRAM;  Surgeon: Kathleene Hazel, MD;  [facility];  Service: Open Heart Surgery;  Laterality: N/A;   JOINT REPLACEMENT  05/2010   left total knee Dr. Romeo Apple   PATELLAR TENDON REPAIR  06/04/2011   Procedure: PATELLA TENDON REPAIR;  Surgeon: Fuller Canada, MD;  Location: [facility];  Service: Orthopedics;  Laterality: Right;   PATELLAR TENDON REPAIR  08/30/2011   Procedure: PATELLA TENDON REPAIR;  Surgeon: Fuller Canada, MD;  Location: [facility];  Service: Orthopedics;  Laterality: Right;  Allograft Reconstrucation Right Patella Tendon   RIGHT/LEFT HEART CATH AND CORONARY ANGIOGRAPHY N/A 07/04/2020   Procedure: RIGHT/LEFT HEART CATH AND CORONARY ANGIOGRAPHY;  Surgeon: Kathleene Hazel, MD;  [facility];  Service: Cardiovascular;  Laterality: N/A;   TOTAL HIP ARTHROPLASTY Left 1998   TOTAL KNEE ARTHROPLASTY  05/03/2011   Procedure: TOTAL KNEE ARTHROPLASTY;  Surgeon: Fuller Canada, MD;  Location: [facility];  Service: Orthopedics;  Laterality: Right;  With DePuy   TOTAL THYROIDECTOMY  11/2009   Dr. Suszanne Conners @ [facility]   TRANSCATHETER  AORTIC VALVE REPLACEMENT, TRANSFEMORAL Right 05/25/2022   Procedure: Transcatheter Aortic Valve Replacement, Transfemoral;  Surgeon: Kathleene HazelMcAlhany, Christopher D, MD;  [facility];  Service: Open Heart Surgery;  Laterality: Right;   TUBAL LIGATION       A IV Location/Drains/Wounds Patient Lines/Drains/Airways Status     Active Line/Drains/Airways      Name Placement date Placement time Site Days   Peripheral IV 01/06/23 20 G Left Antecubital 01/06/23  1544  Antecubital  less than 1   Incision 05/29/11 Knee Right 05/29/11  1233  -- 4240   Incision 06/04/11 Leg Right 06/04/11  1301  -- 4234   Incision 08/30/11 Knee Right 08/30/11  1201  -- 4147   Incision (Closed) 02/15/17 Abdomen 02/15/17  1438  -- 2151   Incision - 4 Ports Abdomen 1: Umbilicus 2: Medial;Upper 3: Medial;Right Right;Lateral 02/15/17  1432  -- 2151   Pressure Injury 01/06/23 Buttocks Bilateral Stage 2 -  Partial thickness loss of dermis presenting as a shallow open injury with a red, pink wound bed without slough. 01/06/23  1956  -- less than 1   Wound / Incision (Open or Dehisced) 01/06/23 Irritant Dermatitis (Moisture Associated Skin Damage) Groin Bilateral 01/06/23  1957  Groin  less than 1   Wound / Incision (Open or Dehisced) 01/06/23 Irritant Dermatitis (Moisture Associated Skin Damage) Breast Lower;Right 01/06/23  1957  Breast  less than 1   Wound / Incision (Open or Dehisced) 01/06/23 Irritant Dermatitis (Moisture Associated Skin Damage) Abdomen Lower;Bilateral 01/06/23  1958  Abdomen  less than 1   Wound / Incision (Open or Dehisced) 01/06/23 Toe (Comment  which one) Anterior;Right 01/06/23  1959  Toe (Comment  which one)  less than 1   Wound / Incision (Open or Dehisced) 01/06/23 Toe (Comment  which one) Anterior;Left 01/06/23  1959  Toe (Comment  which one)  less than 1   Wound / Incision (Open or Dehisced) 01/06/23 Pretibial Distal;Right 01/06/23  2000  Pretibial  less than 1   Wound / Incision (Open or Dehisced) 01/06/23 Pretibial Left 01/06/23  2000  Pretibial  less than 1            Intake/Output Last 24 hours No intake or output data in the 24 hours ending 01/06/23 2058  Labs/Imaging Results for orders placed or performed during the hospital encounter of 01/06/23 (from the past 48 hour(s))  CBC with Differential     Status: Abnormal   Collection Time:  01/06/23  5:01 PM  Result Value Ref Range   WBC 8.3 4.0 - 10.5 K/uL   RBC 3.87 3.87 - 5.11 MIL/uL   Hemoglobin 11.1 (L) 12.0 - 15.0 g/dL   HCT 16.134.0 (L) 09.636.0 - 04.546.0 %   MCV 87.9 80.0 - 100.0 fL   MCH 28.7 26.0 - 34.0 pg   MCHC 32.6 30.0 - 36.0 g/dL   RDW 40.913.8 81.111.5 - 91.415.5 %   Platelets 270 150 - 400 K/uL   nRBC 0.0 0.0 - 0.2 %   Neutrophils Relative % 72 %   Neutro Abs 5.9 1.7 - 7.7 K/uL   Lymphocytes Relative 16 %   Lymphs Abs 1.4 0.7 - 4.0 K/uL   Monocytes Relative 7 %   Monocytes Absolute 0.6 0.1 - 1.0 K/uL   Eosinophils Relative 4 %   Eosinophils Absolute 0.3 0.0 - 0.5 K/uL   Basophils Relative 1 %   Basophils Absolute 0.1 0.0 - 0.1 K/uL   Immature Granulocytes 0 %  Abs Immature Granulocytes 0.03 0.00 - 0.07 K/uL    Comment: Performed at [facility], [address] [place], [place] [address]  Comprehensive metabolic panel     Status: Abnormal   Collection Time: 01/06/23  5:01 PM  Result Value Ref Range   Sodium 139 135 - 145 mmol/L   Potassium 3.1 (L) 3.5 - 5.1 mmol/L   Chloride 101 98 - 111 mmol/L   CO2 28 22 - 32 mmol/L   Glucose, Bld 82 70 - 99 mg/dL    Comment: Glucose reference range applies only to samples taken after fasting for at least 8 hours.   BUN 25 (H) 8 - 23 mg/dL   Creatinine, Ser 1.57 (H) 0.44 - 1.00 mg/dL   Calcium 8.5 (L) 8.9 - 10.3 mg/dL   Total Protein 6.1 (L) 6.5 - 8.1 g/dL   Albumin 3.2 (L) 3.5 - 5.0 g/dL   AST 28 15 - 41 U/L   ALT 13 0 - 44 U/L   Alkaline Phosphatase 76 38 - 126 U/L   Total Bilirubin 0.6 0.3 - 1.2 mg/dL   GFR, Estimated 52 (L) >60 mL/min    Comment: (NOTE) Calculated using the CKD-EPI Creatinine Equation (2021)    Anion gap 10 5 - 15    Comment: Performed at [facility], [address] [place], [place] [address]  Brain natriuretic peptide     Status: Abnormal   Collection Time: 01/06/23  5:01 PM  Result Value Ref Range   B Natriuretic Peptide 170.6 (H) 0.0 - 100.0 pg/mL    Comment: Performed at [facility], [address], [place], [place] [address]  Sedimentation rate     Status: Abnormal   Collection Time: 01/06/23  5:01 PM  Result Value Ref Range   Sed Rate 44 (H) 0 - 22 mm/hr    Comment: Performed at [facility], [address] [place], [place] [address]  C-reactive protein     Status: Abnormal   Collection Time: 01/06/23  5:01 PM  Result Value Ref Range   CRP 3.4 (H) <1.0 mg/dL    Comment: Performed at [facility], [address] [place], [place] [address]   DG Foot Complete Right  Result Date: 01/06/2023 CLINICAL DATA:  Right-greater-than-left bilateral feet erythema and edema with bleeding at the toes EXAM: RIGHT FOOT COMPLETE - 3 VIEW COMPARISON:  None Available. FINDINGS: There is no evidence of fracture or dislocation. Diffuse degenerative changes of the foot. Hallux valgus. No radiographic finding of soft tissue ulcerations. Ovoid radiodensity projecting over the plantar aspect of the great toe measures 2 mm. IMPRESSION: 1. No acute osseous abnormality or radiographic finding of soft tissue ulceration. 2. Ovoid radiodensity projecting over the plantar aspect of the great toe may reflect a foreign body, possibly external to the patient and would be amenable to direct inspection. Electronically Signed   By: Agustin Cree M.D.   On: 01/06/2023 17:10    Pending Labs Unresulted Labs (From admission, onward)     Start     Ordered   01/07/23 0500  Basic metabolic panel  Tomorrow morning,   R        01/06/23 2034   01/07/23 0500  CBC  Tomorrow morning,   R        01/06/23 2034   01/06/23 1916  Blood culture (routine x 2)  BLOOD CULTURE X 2,   R (with STAT occurrences)  01/06/23 1916            Vitals/Pain Today's Vitals   01/06/23 1548 01/06/23 1552 01/06/23 1615 01/06/23 2015  BP:  (!) 159/81  (!) 170/94  Pulse:  75 80 91  Resp:   16 15  Temp:  97.9 F (36.6 C)  98.5 F (36.9 C)  TempSrc:  Oral  Oral  SpO2:  99% 98% 99%  Weight: 90.7 kg      Height: 5\' 9"  (1.753 m)     PainSc: 0-No pain       Isolation Precautions No active isolations  Medications Medications  vancomycin (VANCOREADY) IVPB 1500 mg/300 mL (1,500 mg Intravenous New Bag/Given 01/06/23 2016)  furosemide (LASIX) tablet 60 mg (has no administration in time range)  PARoxetine (PAXIL) tablet 20 mg (has no administration in time range)  levothyroxine (SYNTHROID) tablet 125 mcg (has no administration in time range)  enoxaparin (LOVENOX) injection 40 mg (has no administration in time range)  acetaminophen (TYLENOL) tablet 650 mg (has no administration in time range)    Or  acetaminophen (TYLENOL) suppository 650 mg (has no administration in time range)  clotrimazole (LOTRIMIN) 1 % cream (has no administration in time range)  potassium chloride (KLOR-CON) packet 20 mEq (20 mEq Oral Given 01/06/23 1959)    Mobility walks with device     Focused Assessments    R Recommendations: See Admitting Provider Note  Report given to:   Additional Notes:

## 2023-01-06 NOTE — ED Provider Notes (Signed)
[place] EMERGENCY DEPARTMENT AT [facility] Provider Note  Medical Decision Making   HPI: [name] is a 89 y.o. female with history perinent hypothyroidism, hyperlipidemia, anxiety/depression, aortic stenosis status post TAVR who presents complaining of foot swelling/bleeding. Patient arrived via EMS from home.  History provided by patient.  No interpreter required for this encounter.  Patient reports that she lives at home, and performs all of her ADLs independently.  Reports that she has weeping edema of her bilateral lower extremities that has been ongoing for some time and is being managed by her podiatrist and her PCP.  Reports that today she noticed her dog was licking her foot, and then noticed that there was significant blood from her toes.  Denies any pain, any recent injuries or wound, is able to ambulate independently, though does note that she has significantly decreased sensation in her bilateral feet, left greater than right.  Denies any recent chest pain, shortness of breath, fevers, chills.  She states that she was pregnant before, therefore she called EMS for further evaluation.  ROS: As per HPI. Please see MAR for complete past medical history, surgical history, and social history.   Physical exam is pertinent for erythema of the left greater than right lower extremities, edema of the left greater than right leg.  Decreased sensation of feet bilaterally.  Left foot has skin breakdown and malodorous purulence at the lateral aspects between the second and third toes, with plantar aspect of the left toe with breakdown and mild venous oozing  The differential includes but is not limited to skin breakdown, neuropathy related wound, gangrene, osteomyelitis, cellulitis.  Additional history obtained from: None External records from outside source obtained and reviewed including: None  ED provider interpretation of ECG: Not indicated  ED provider  interpretation of radiology/imaging: Reviewed plain film of the foot, I do not appreciate any fracture, dislocation or overt osseous erosions  Labs ordered were interpreted by myself as well as my attending and were incorporated into the medical decision making process for this patient.  ED provider interpretation of labs: CBC without leukocytosis or thrombocytopenia, stable anemia on comparison to prior, CMP with mild hypokalemia, BNP mildly elevated, ESR and CRP mildly elevated.  Blood cultures pending.  Interventions: Potassium, vancomycin  See the EMR for full details regarding lab and imaging results.  On physical exam, patient has purulent appearing wound as well as concern for bilateral cellulitis, worse on left lower extremity.  Patient has difficulties with ADLs, decrease sensation of feet, decreased range of motion which complicates her ability to care for her feet.  Concern for possible osteomyelitis, no obvious erosions on x-rays of the foot.  No leukocytosis, vital signs stable, doubt sepsis.  Given regulant, feel that vancomycin is indicated.  Blood cultures drawn and vancomycin administered.  Given appearance of wound, decreased ability of patient to provide care for wound herself, do feel that patient admission is warranted for antibiotics and for their evaluation for possible underlying osteomyelitis.  Medicine consulted for admission, patient accepted to the family does not service.  No additional acute events while patient was under my care in the ED.  Consults: Family medicine  Disposition: ADMIT: I believe the patient requires admission for further care and management. The patient was admitted to family medicine. Please see inpatient provider note for additional treatment plan details.   The plan for this patient was discussed with Dr. Freida Busman, who voiced agreement and who oversaw evaluation and treatment of this patient.  Clinical Impression:  1. Wound of left lower extremity,  initial encounter    Admit  Therapies: These medications and interventions were provided for the patient while in the ED. Medications  vancomycin (VANCOREADY) IVPB 1500 mg/300 mL (1,500 mg Intravenous New Bag/Given 01/06/23 2016)  furosemide (LASIX) tablet 60 mg (has no administration in time range)  PARoxetine (PAXIL) tablet 20 mg (has no administration in time range)  levothyroxine (SYNTHROID) tablet 125 mcg (has no administration in time range)  oxybutynin (DITROPAN-XL) 24 hr tablet 15 mg (has no administration in time range)  enoxaparin (LOVENOX) injection 40 mg (has no administration in time range)  acetaminophen (TYLENOL) tablet 650 mg (has no administration in time range)    Or  acetaminophen (TYLENOL) suppository 650 mg (has no administration in time range)  nystatin (MYCOSTATIN/NYSTOP) topical powder (has no administration in time range)  potassium chloride (KLOR-CON) packet 20 mEq (20 mEq Oral Given 01/06/23 1959)    MDM generated using voice dictation software and may contain dictation errors.  Please contact me for any clarification or with any questions.  Clinical Complexity A medically appropriate history, review of systems, and physical exam was performed.  Collateral history obtained from: None I personally reviewed the labs, EKG, imaging as discussed above. Patient's presentation is most consistent with acute complicated illness / injury requiring diagnostic workup Considered and ruled out life and body threatening conditions  Treatment: Hospitalization Medications: Prescription Discussed patient's care with providers from the following different specialties: Family medicine  Physical Exam   ED Triage Vitals  Enc Vitals Group     BP 01/06/23 1552 (!) 159/81     Pulse Rate 01/06/23 1552 75     Resp 01/06/23 1615 16     Temp 01/06/23 1552 97.9 F (36.6 C)     Temp Source 01/06/23 1552 Oral     SpO2 01/06/23 1552 99 %     Weight 01/06/23 1548 199 lb 15.3 oz  (90.7 kg)     Height 01/06/23 1548 5\' 9"  (1.753 m)     Head Circumference --      Peak Flow --      Pain Score 01/06/23 1548 0     Pain Loc --      Pain Edu? --      Excl. in GC? --      Physical Exam Vitals and nursing note reviewed.  Constitutional:      General: She is not in acute distress.    Appearance: She is well-developed. She is obese.  HENT:     Head: Normocephalic and atraumatic.  Eyes:     Conjunctiva/sclera: Conjunctivae normal.  Cardiovascular:     Rate and Rhythm: Normal rate and regular rhythm.     Heart sounds: No murmur heard. Pulmonary:     Effort: Pulmonary effort is normal. No respiratory distress.     Breath sounds: Normal breath sounds.  Abdominal:     Palpations: Abdomen is soft.     Tenderness: There is no abdominal tenderness.  Musculoskeletal:        General: No swelling.     Cervical back: Neck supple.     Comments: Erythema of the left greater than right lower extremities, edema of the left greater than right leg.  Decreased sensation of feet bilaterally.  Left foot has skin breakdown and malodorous purulence at the lateral aspects between the second and third toes, with plantar aspect of the left toe with breakdown and mild venous oozing  Skin:  General: Skin is warm and dry.     Capillary Refill: Capillary refill takes less than 2 seconds.  Neurological:     Mental Status: She is alert.  Psychiatric:        Mood and Affect: Mood normal.       Procedure Note  Procedures  DG Foot Complete Right  Final Result      Julianne RiceL. S. Jasaun Carn, MD Emergency Medicine, PGY-2     Curley SpiceStanek, Jashiya Bassett, MD 01/06/23 2246    Lorre NickAllen, Anthony, MD 01/09/23 1525

## 2023-01-06 NOTE — Assessment & Plan Note (Addendum)
Patient with some redness near the sacrum. No open wound. She does use a wheel chair and has difficulty with mobility.  - F/u wound care recs - Frequent assistance with repositioning.

## 2023-01-06 NOTE — H&P (Addendum)
Hospital Admission History and Physical Service Pager: [phone]  Patient name: Paige BoFrances K Brouillard Medical record number: [id number] Date of Birth: Mar 06, 1933 Age: 89 y.o. Gender: female  Primary Care Provider: Assunta FoundGolding, John, MD Consultants: None Code Status: FULL which was confirmed with family if patient unable to confirm   Preferred Emergency Contact: [phone] Alona BeneJoyce (Daughter)  Chief Complaint: fluid leaking from feet   Assessment and Plan: [name] is a 89 y.o. female presenting with fluid leaking from her feet. Differential for this patient's presentation of this includes cellulitis or venous stasis ulcer.  Most likely cellulitis as there is purulent discharge.    * Cellulitis Right foot with purulent discharge, erythema, and increased edema. Most likely due to venous stasis; though patient also has decreased sensation in her feet and no known history of diabetes. No recent A1c in chart. Failed outpatient antibiotic therapy.  - Admit to FPTS, Attending Dr. Deirdre Priesthambliss - Continue vancomycin, dosing per pharmacy   - Consult wound care  - F/u Bcx  - A1c -  CBC and BMP - Consult PT and OT  - Monitor progress of erythema and edema   Intertrigo Significant intertrigo beneath breasts and pannus. Has tried nystatin powder.  - Clotrimazole cream   - Continue to monitor   Pressure injury of skin Patient with some redness near the sacrum. No open wound. She does use a wheel chair and has difficulty with mobility.  - Consult to wound care to stage wound.  - Frequent assistance with repositioning.   Hypokalemia K 3.1 on admission. Repleted in the ED. Most likely secondary to lasix use. Takes supplemental potassium outpatient.  - BMP tomorrow AM  - Replete as necessary    Chronic Conditions:  HFpEF-continue Lasix 60 mg p.o. daily Hypothyroidism-continue Synthroid 150 mcg Depression-continue paroxetine 20 mg daily  FEN/GI: Regular VTE Prophylaxis:  Lovenox  Disposition: Med-tele  History of Present Illness:  Paige BoFrances K Scrogham is a 89 y.o. female presenting with fluid leaking from her feet.   Mentions that she has been dealing with chronic wounds on her legs.  However today she noticed that there was fluid leaking from her right foot.  He noticed as her dog was licking the wound and the fluid leaking from the wound.  Has also noticed worsening swelling and redness.  Denies any pain.  She has been taking amoxicillin.  Today is the third day.  Says that her doctor was treating her for possible foot infection.  Has been treated for this in the past.  She says pain center told her to get a sponge with a stick in it and use dial soap on the area. Also told to lather lotion all over the area by her wound care nurse.  Patient reports that she takes all of her medications in the morning.  Reports adherence to Lasix.  Says that she takes care of her wounds by herself.  She does say her foot has been swelling since August 2023.   She is in a wheelchair because of knee pain s/p bilateral TKA. Lives at home alone. Daughter brings groceries to her, but she cooks/cleans does all ADLs on her own.  Denies any chest pain, SOB, confusion, dysuria, changes in bowel habits.  Does not have a HCPOA or Advance Directive.  Her PCP is Dr. Audrea MuscatJohn Golden; [facility] [place].   In the ED, patient was hemodynamically stable, afebrile, and without leukocytosis.  She does have an elevated  CRP of 3.4.  Her BNP was also elevated at 170.6.  She was started on vancomycin for cellulitis.  Review Of Systems: Per HPI with the following additions: Decreased feeling in feet, chronic wounds  Pertinent Past Medical History: Hypertension Hyperlipidemia HFpEF Depression and anxiety Hypothyroidism Remainder reviewed in history tab.   Pertinent Past Surgical History: BL BKA S/p TAVR Thyroidectomy Cholecystectomy Left foot  reconstruction- bunyon removal and artifical joint in big toe on left foot; 3 other toes wired in place Remainder reviewed in history tab.   Pertinent Social History: Tobacco use: Former, quit 27 years ago. Used to smoke PPD Alcohol use: Denies Other Substance use: Denies Lives with herself, daughter occasionally helps by bringing groceries  Pertinent Family History: None Remainder reviewed in history tab.   Important Outpatient Medications: Synthroid 150 mcg Paroextine 20 mg daily Oxybutynin 15 mg  Nitroglycerin 0.4 mg PRN for chest pain Furosemide 60 mg daily  Remainder reviewed in medication history.   Objective: BP (!) 162/71   Pulse 86   Temp 98.5 F (36.9 C) (Oral)   Resp 16   Ht 5\' 9"  (1.753 m)   Wt 90.7 kg   SpO2 100%   BMI 29.53 kg/m  Exam: General: Appears stated age, in no acute distress ENTM: Mucous membranes moist Cardiovascular: RRR, radial pulses palpable and symmetric, cap refill in 2 seconds Respiratory: Normal work of breathing on room air Gastrointestinal: Soft, nontender, nondistended MSK: Difficulty extending and flexing her knees, well-healed TKA incisions over b/l knees Derm: Bilateral lower extremity edema and erythema right greater than left.  Right first 3 toes with purulent discharge in between.  Dirt caked underneath feet. (Pictures in media)  Erythema near sacral area no open wound Significant erythema and flaking underneath breasts and beneath pannus fitting with intertrigo Neuro: Alert and oriented x 4  Labs:  CBC BMET  Recent Labs  Lab 01/06/23 1701  WBC 8.3  HGB 11.1*  HCT 34.0*  PLT 270   Recent Labs  Lab 01/06/23 1701  NA 139  K 3.1*  CL 101  CO2 28  BUN 25*  CREATININE 1.04*  GLUCOSE 82  CALCIUM 8.5*     Imaging Studies Performed:  DG Foot Complete Right:  1. No acute osseous abnormality or radiographic finding of soft tissue ulceration. 2. Ovoid radiodensity projecting over the plantar aspect of the great  toe may reflect a foreign body, possibly external to the patient and would be amenable to direct inspection.   Lockie Mola, MD 01/06/2023, 10:29 PM PGY-1, [facility] Family [facility]  FPTS Intern pager: [phone], text pages welcome Secure chat group [facility] Teaching Service   Upper Level Addendum:  I have seen and evaluated this patient along with Dr. Marsh Dolly and reviewed the above note, making necessary revisions.  Darral Dash D.O. 01/07/2023, 6:52 AM PGY-2, [facility]

## 2023-01-06 NOTE — Assessment & Plan Note (Addendum)
Patient presenting with right foot with purulent discharge, erythema, and increased edema on admission. Patient has long standing neuropathy and venous stasis. Previously failed outpatient antibiotic therapy with amoxicillin and ceftriaxone. Blood cultures no growth for over 12 hours.  - Continue vancomycin, dosing per pharmacy   - Wound care instructions:  - Lower legs cover anterior legs with single layer Xeroform gauze [place] [phone]) daily,  cover with ABD pad or Telfa pad.  Wrap bilateral legs with kerlix gauze starting from just above toe and ending right below knee.  Secure with Ace wrap in same fashion.    - R foot 2nd and 3rd digits clean with NS, place Silver Hydrofiber cut to fit wound bed daily [place] [phone]), secure with Kerlix and tape.   - L plantar foot and toes, R great toe paint with Betadine twice daily and leave open to air.   - F/u Bcx  - AM CBC and BMP  - F/u PT/OT recs - Monitor progress of erythema and edema

## 2023-01-06 NOTE — ED Provider Notes (Signed)
I saw and evaluated the patient, reviewed the resident's note and I agree with the findings and plan.   89 year old female presents with erythema and edema to bilateral feet worse on the right side.  Has evidence of possible infection between her toes on her right foot.  Does have some lymphangitis going up her leg.  Suspect bilateral cellulitis.  Labs are pending at this time.  Patient will likely require admission   Lorre Nick, MD 01/06/23 1641

## 2023-01-06 NOTE — Assessment & Plan Note (Addendum)
K 4.0 today.

## 2023-01-06 NOTE — Progress Notes (Signed)
Pharmacy Antibiotic Note  [name] is a 89 y.o. female admitted on 01/06/2023 with cellulitis. Pharmacy has been consulted for vancomycin dosing.  Plan: -Give vancomycin 2g IV x1 now, then vancomycin 750mg  IV q24hrs (eAUC 449 using Scr 1.04 and Vd 0.5 as patient is borderline BMI >30) -Vanc levels as needed  -Monitor renal function, cultures, and overall clinical picture   Height: 5\' 9"  (175.3 cm) Weight: 90.7 kg (199 lb 15.3 oz) IBW/kg (Calculated) : 66.2  Temp (24hrs), Avg:98.2 F (36.8 C), Min:97.9 F (36.6 C), Max:98.5 F (36.9 C)  Recent Labs  Lab 01/06/23 1701  WBC 8.3  CREATININE 1.04*    Estimated Creatinine Clearance: 44.9 mL/min (A) (by C-G formula based on SCr of 1.04 mg/dL (H)).    No Active Allergies  Antimicrobials this admission: 4/11 vancomycin >>    Microbiology results: 4/11 BCx: in process    Thank you for allowing pharmacy to be a part of this patient's care.  Cherylin Mylar, PharmD PGY1 Pharmacy Resident 4/11/202410:24 PM

## 2023-01-06 NOTE — ED Triage Notes (Signed)
Pt BIB EMS from home, c/o toe bleeding. Pt has obvious skin breakdown under both breasts and abdomen. Legs are weeping with sores, red, and 2+ pitting edema. Pt says weeping has been ongoing for 5 weeks and taking amoxicillin. Aox4.

## 2023-01-07 ENCOUNTER — Ambulatory Visit (HOSPITAL_COMMUNITY): Payer: 59

## 2023-01-07 DIAGNOSIS — T501X5A Adverse effect of loop [high-ceiling] diuretics, initial encounter: Secondary | ICD-10-CM | POA: Diagnosis present

## 2023-01-07 DIAGNOSIS — R7982 Elevated C-reactive protein (CRP): Secondary | ICD-10-CM | POA: Diagnosis present

## 2023-01-07 DIAGNOSIS — I5032 Chronic diastolic (congestive) heart failure: Secondary | ICD-10-CM | POA: Diagnosis present

## 2023-01-07 DIAGNOSIS — F329 Major depressive disorder, single episode, unspecified: Secondary | ICD-10-CM | POA: Diagnosis present

## 2023-01-07 DIAGNOSIS — S81802A Unspecified open wound, left lower leg, initial encounter: Secondary | ICD-10-CM

## 2023-01-07 DIAGNOSIS — E785 Hyperlipidemia, unspecified: Secondary | ICD-10-CM | POA: Diagnosis present

## 2023-01-07 DIAGNOSIS — E890 Postprocedural hypothyroidism: Secondary | ICD-10-CM | POA: Diagnosis present

## 2023-01-07 DIAGNOSIS — I878 Other specified disorders of veins: Secondary | ICD-10-CM | POA: Diagnosis present

## 2023-01-07 DIAGNOSIS — L03115 Cellulitis of right lower limb: Secondary | ICD-10-CM | POA: Diagnosis not present

## 2023-01-07 DIAGNOSIS — I110 Hypertensive heart disease with heart failure: Secondary | ICD-10-CM | POA: Diagnosis present

## 2023-01-07 DIAGNOSIS — Z87891 Personal history of nicotine dependence: Secondary | ICD-10-CM | POA: Diagnosis not present

## 2023-01-07 DIAGNOSIS — E876 Hypokalemia: Secondary | ICD-10-CM | POA: Diagnosis present

## 2023-01-07 DIAGNOSIS — L304 Erythema intertrigo: Secondary | ICD-10-CM | POA: Diagnosis present

## 2023-01-07 DIAGNOSIS — Z7409 Other reduced mobility: Secondary | ICD-10-CM | POA: Diagnosis present

## 2023-01-07 DIAGNOSIS — Z9049 Acquired absence of other specified parts of digestive tract: Secondary | ICD-10-CM | POA: Diagnosis not present

## 2023-01-07 DIAGNOSIS — Z79899 Other long term (current) drug therapy: Secondary | ICD-10-CM | POA: Diagnosis not present

## 2023-01-07 DIAGNOSIS — M25569 Pain in unspecified knee: Secondary | ICD-10-CM | POA: Diagnosis present

## 2023-01-07 DIAGNOSIS — L89152 Pressure ulcer of sacral region, stage 2: Secondary | ICD-10-CM | POA: Diagnosis present

## 2023-01-07 DIAGNOSIS — Z952 Presence of prosthetic heart valve: Secondary | ICD-10-CM | POA: Diagnosis not present

## 2023-01-07 DIAGNOSIS — M7989 Other specified soft tissue disorders: Secondary | ICD-10-CM | POA: Diagnosis present

## 2023-01-07 DIAGNOSIS — Z96653 Presence of artificial knee joint, bilateral: Secondary | ICD-10-CM | POA: Diagnosis present

## 2023-01-07 DIAGNOSIS — F419 Anxiety disorder, unspecified: Secondary | ICD-10-CM | POA: Diagnosis present

## 2023-01-07 DIAGNOSIS — G629 Polyneuropathy, unspecified: Secondary | ICD-10-CM | POA: Diagnosis present

## 2023-01-07 DIAGNOSIS — L308 Other specified dermatitis: Secondary | ICD-10-CM | POA: Diagnosis present

## 2023-01-07 DIAGNOSIS — Z79890 Hormone replacement therapy (postmenopausal): Secondary | ICD-10-CM | POA: Diagnosis not present

## 2023-01-07 HISTORY — DX: Unspecified open wound, left lower leg, initial encounter: S81.802A

## 2023-01-07 LAB — CBC
HCT: 31 % — ABNORMAL LOW (ref 36.0–46.0)
Hemoglobin: 10.1 g/dL — ABNORMAL LOW (ref 12.0–15.0)
MCH: 28.5 pg (ref 26.0–34.0)
MCHC: 32.6 g/dL (ref 30.0–36.0)
MCV: 87.6 fL (ref 80.0–100.0)
Platelets: 257 10*3/uL (ref 150–400)
RBC: 3.54 MIL/uL — ABNORMAL LOW (ref 3.87–5.11)
RDW: 13.7 % (ref 11.5–15.5)
WBC: 8.8 10*3/uL (ref 4.0–10.5)
nRBC: 0 % (ref 0.0–0.2)

## 2023-01-07 LAB — BASIC METABOLIC PANEL
Anion gap: 10 (ref 5–15)
BUN: 20 mg/dL (ref 8–23)
CO2: 27 mmol/L (ref 22–32)
Calcium: 8 mg/dL — ABNORMAL LOW (ref 8.9–10.3)
Chloride: 101 mmol/L (ref 98–111)
Creatinine, Ser: 0.83 mg/dL (ref 0.44–1.00)
GFR, Estimated: >60 mL/min (ref >60)
Glucose, Bld: 112 mg/dL — ABNORMAL HIGH (ref 70–99)
Potassium: 3.3 mmol/L — ABNORMAL LOW (ref 3.5–5.1)
Sodium: 138 mmol/L (ref 135–145)

## 2023-01-07 LAB — HEMOGLOBIN A1C
Hgb A1c MFr Bld: 5.1 % (ref 4.8–5.6)
Mean Plasma Glucose: 99.67 mg/dL

## 2023-01-07 MED ORDER — ZINC OXIDE 40 % EX OINT
TOPICAL_OINTMENT | Freq: Three times a day (TID) | CUTANEOUS | Status: DC
  Filled 2023-01-07: qty 57

## 2023-01-07 MED ORDER — POTASSIUM CHLORIDE 20 MEQ PO PACK
60.0000 meq | PACK | Freq: Once | ORAL | Status: AC
  Administered 2023-01-07: 60 meq via ORAL
  Filled 2023-01-07: qty 3

## 2023-01-07 NOTE — Consult Note (Signed)
   [facility] CM Inpatient Consult   01/07/2023  [name] [birth year] [id number]  [facility] [[facility]]  Accountable Care Organization [ACO] Patient: [name] Medicare  Primary Care Provider: Assunta Found, MD with [facility]  Referral:  Inpatient [facility] RNCM, Marylene Land, post hospital support, patient in OBS status    Patient evaluated for community based chronic complex disease management services with [facility] Care Management Program as a benefit of patient's Medicare Insurance. Spoke with patient sitting on side of bed, to explain [facility] Care Management services.   Explained that the patient qualifies for community care coordination and management as a benefit.  Patient given a 24 hour nurse advise line magnet and an appointment reminder card, placed with other papers on the bedside table.  Patient was gracious and states she wouldn't mind follow up and she endorses the PCP listed as her provider.  Patient was for potential skilled nursing however states she wants to go home.   Patient will receive post hospital discharge call and will be evaluated for SDOH.  Patient assures that she has all needs for home met. Plan:  Follow up with community TOC anticipated.    Of note, [facility] Care Management services does not replace or interfere with any services that are arranged by inpatient case management or social work.  For additional questions or referrals please contact:    Charlesetta Shanks, RN BSN CCM [facility]  [phone] business mobile phone Toll free office [phone]  *Concierge Line  [phone] Fax number: [phone] [email or web address]

## 2023-01-07 NOTE — Evaluation (Signed)
Physical Therapy Evaluation Patient Details Name: [name] MRN: [id number] DOB: September 23, 1933 Today's Date: 01/07/2023  History of Present Illness  Pt is an 89 y.o. female presenting 4/11 with toe bleeding.  Found to have cellulitis. PMH significant for HTN, HLD, HFpEF, depression, anxiety, hypothyroidism.  Clinical Impression  Pt admitted with above diagnosis. Previously w/c bound, able to transfer herself to and from w/c and lift chair. States she has been unable to bathe in about 1 year due to difficulty getting into tub. Able to transfer with min assist for set-up with sliding board today from bed to recliner, set-up simulating home environment. My concern is her ability to care for herself and progressively declining with current bil knee contractures and inability to stand, the wounds on her buttocks are at high risk for worsening. Extensive education on unloading schedule, use of her w/c cushion. Would benefit from skilled facility to recover but likely to decline in which case HHPT would be appropriate in an attempt to improve her strength and functional independence for transfers and mobility. Ideally progress to a squat pivot transfer technique to reduce sheering forces on buttocks. Pt currently with functional limitations due to the deficits listed below (see PT Problem List). Pt will benefit from acute skilled PT to increase their independence and safety with mobility to allow discharge.      Patient will benefit from continued inpatient follow up therapy, <3 hours/day Likely to refuse, consider HHPT if so and max HH services if able.     Recommendations for follow up therapy are one component of a multi-disciplinary discharge planning process, led by the attending physician.  Recommendations may be updated based on patient status, additional functional criteria and insurance authorization.  Follow Up Recommendations Can patient physically be transported by private vehicle:  No     Assistance Recommended at Discharge Intermittent Supervision/Assistance  Patient can return home with the following  A little help with walking and/or transfers;A little help with bathing/dressing/bathroom;Assistance with cooking/housework;Help with stairs or ramp for entrance;Assist for transportation    Equipment Recommendations None recommended by PT  Recommendations for Other Services       Functional Status Assessment Patient has had a recent decline in their functional status and demonstrates the ability to make significant improvements in function in a reasonable and predictable amount of time.     Precautions / Restrictions Precautions Precautions: Fall Restrictions Weight Bearing Restrictions: No      Mobility  Bed Mobility Overal bed mobility: Modified Independent             General bed mobility comments: Extra time to rise to EOB.    Transfers Overall transfer level: Needs assistance Equipment used: Sliding board Transfers: Bed to chair/wheelchair/BSC            Lateral/Scoot Transfers: Min assist General transfer comment: Min assist to readjust sliding board appropriately. Otherwise min guard during transfer towards right side, similar to home set up where she goes from w/c to her lift chair.    Ambulation/Gait               General Gait Details: non ambulatory  Stairs            Wheelchair Mobility    Modified Rankin (Stroke Patients Only)       Balance Overall balance assessment: Needs assistance Sitting-balance support: No upper extremity supported, Feet supported Sitting balance-Leahy Scale: Fair  Pertinent Vitals/Pain Pain Assessment Pain Assessment: 0-10 Pain Score: 7  Pain Location: knees Pain Descriptors / Indicators: Aching Pain Intervention(s): Monitored during session, Repositioned    Home Living Family/patient expects to be discharged to::  Private residence Living Arrangements: Alone Available Help at Discharge: Family;Available PRN/intermittently Type of Home: House Home Access: Ramped entrance       Home Layout: One level Home Equipment: Agricultural consultant (2 wheels);Rollator (4 wheels);Standard Walker;Tub bench;Wheelchair - manual Additional Comments: Waste management picks up her trash for her, One daughter nearby, limited help. Uses a transportation service for MD appointments.    Prior Function Prior Level of Function : Independent/Modified Independent             Mobility Comments: States she uses w/c and lateral scoots, has not stood since 2011. ADLs Comments: States she does all of her own house work.  Takes bird bath, has not been in bathtub for > 1 year.     Hand Dominance   Dominant Hand: Right    Extremity/Trunk Assessment   Upper Extremity Assessment Upper Extremity Assessment: Defer to OT evaluation    Lower Extremity Assessment Lower Extremity Assessment: Generalized weakness (BIL LE contractures baseline; LEs bandaged)       Communication   Communication: No difficulties  Cognition Arousal/Alertness: Awake/alert Behavior During Therapy: WFL for tasks assessed/performed Overall Cognitive Status: Within Functional Limits for tasks assessed                                          General Comments General comments (skin integrity, edema, etc.): Educated on weigth shifting every 30 minutes for pressure relief.    Exercises     Assessment/Plan    PT Assessment Patient needs continued PT services  PT Problem List Decreased strength;Decreased range of motion;Decreased activity tolerance;Decreased balance;Decreased mobility;Decreased knowledge of use of DME;Decreased safety awareness;Decreased knowledge of precautions;Cardiopulmonary status limiting activity;Decreased skin integrity;Pain       PT Treatment Interventions DME instruction;Functional mobility  training;Therapeutic activities;Therapeutic exercise;Balance training;Neuromuscular re-education;Patient/family education;Modalities;Wheelchair mobility training    PT Goals (Current goals can be found in the Care Plan section)  Acute Rehab PT Goals Patient Stated Goal: Go home PT Goal Formulation: With patient Time For Goal Achievement: 01/21/23 Potential to Achieve Goals: Fair    Frequency Min 3X/week     Co-evaluation               AM-PAC PT "6 Clicks" Mobility  Outcome Measure Help needed turning from your back to your side while in a flat bed without using bedrails?: None Help needed moving from lying on your back to sitting on the side of a flat bed without using bedrails?: None Help needed moving to and from a bed to a chair (including a wheelchair)?: A Little Help needed standing up from a chair using your arms (e.g., wheelchair or bedside chair)?: Total Help needed to walk in hospital room?: Total Help needed climbing 3-5 steps with a railing? : Total 6 Click Score: 14    End of Session Equipment Utilized During Treatment: Gait belt Activity Tolerance: Patient tolerated treatment well Patient left: in chair;with call bell/phone within reach;with chair alarm set Nurse Communication: Mobility status;Other (comment) (Sliding board) PT Visit Diagnosis: Muscle weakness (generalized) (M62.81);History of falling (Z91.81);Difficulty in walking, not elsewhere classified (R26.2);Pain Pain - Right/Left:  (bil) Pain - part of body: Leg    Time: 2947-6546  PT Time Calculation (min) (ACUTE ONLY): 36 min   Charges:   PT Evaluation $PT Eval Low Complexity: 1 Low PT Treatments $Therapeutic Activity: 8-22 mins        Kathlyn Sacramento, PT, DPT Physical Therapist Acute Rehabilitation Services [facility] Outpatient Rehabilitation Services [facility]   Berton Mount 01/07/2023, 12:19 PM

## 2023-01-07 NOTE — Hospital Course (Signed)
[name] is a 89 y.o.female with a history of HFpEF, venous stasis, hypothyroidism, MDD  who was admitted to the [facility] Teaching Service at [facility] for cellulitis. Her hospital course is detailed below:  Cellulitis of R foot  Patient presented with fluid leakage from R foot. On admission patient was hemodynamically stable, afebrile, without leukocytosis. However, had purulent discharge from foot with edema and erythema. She had failed outpatient therapy after 3 days of amoxicillin. Wound care was consulted while admitted. She was started on vancomycin. Blood cultures showed no growth at 2 days. She was discharged with oral doxycycline and keflex to complete at 7 day total course of antibiotics. Home health OT, PT, and aide were ordered at discharge.    Intertrigo  Patient had significant intertrigo beneath breasts and pannus on admission. She had tried nystatin powder outpatient. Wound care was consulted and started interdry.   Other chronic conditions were medically managed with home medications and formulary alternatives as necessary (HFpEF, MDD, Hypothyroidism)  PCP Follow-up Recommendations: F/u pressure ulcer and wounds  F/u intertrigo  Ensure continued improvement of cellulitis  Ensure pt is receiving home health OT, PT, and aide if desired

## 2023-01-07 NOTE — TOC Initial Note (Signed)
Transition of Care [facility]) - Initial/Assessment Note    Patient Details  Name: [name] MRN: [id number] Date of Birth: Nov 29, 1932  [facility]) CM/SW Contact:    Lorri Frederick, LCSW Phone Number: 01/07/2023, 2:35 PM  Clinical Narrative:   CSW met with pt regarding DC recommendation for SNF.  After asking several questions about what service SNF would provide, pt declines SNF and says she wants to return home, she is open to [facility].  Pt from home alone, no current services.  Pt uses wheelchair full time but is able to transfer into her recliner, where she sleeps.  Daughter [name] lives nearby in [place] and provides support, although still working full time.    Current DME in home: wheelchair, sliding board.   Pt would like a tub bench, as she got rid of her last one because it did not fit right and was unsteady.  PCP in place.                Expected Discharge Plan: Home w Home Health Services Barriers to Discharge: Continued Medical Work up   Patient Goals and CMS Choice Patient states their goals for this hospitalization and ongoing recovery are:: get a handle on these infections          Expected Discharge Plan and Services In-house Referral: Clinical Social Work   Post Acute Care Choice: Home Health Living arrangements for the past 2 months: Single Family Home                                      Prior Living Arrangements/Services Living arrangements for the past 2 months: Single Family Home Lives with:: Self Patient language and need for interpreter reviewed:: Yes Do you feel safe going back to the place where you live?: Yes      Need for Family Participation in Patient Care: Yes (Comment) Care giver support system in place?: Yes (comment) Current home services: Other (comment) (none) Criminal Activity/Legal Involvement Pertinent to Current Situation/Hospitalization: No - Comment as needed  Activities of Daily Living Home Assistive  Devices/Equipment: Eyeglasses, Wheelchair ADL Screening (condition at time of admission) Patient's cognitive ability adequate to safely complete daily activities?: Yes Is the patient deaf or have difficulty hearing?: No Does the patient have difficulty seeing, even when wearing glasses/contacts?: No Does the patient have difficulty concentrating, remembering, or making decisions?: No Patient able to express need for assistance with ADLs?: Yes Does the patient have difficulty dressing or bathing?: No Independently performs ADLs?: Yes (appropriate for developmental age) Does the patient have difficulty walking or climbing stairs?: Yes Weakness of Legs: Both Weakness of Arms/Hands: None  Permission Sought/Granted Permission sought to share information with : Family Supports Permission granted to share information with : Yes, Verbal Permission Granted  Share Information with NAME: daughter [name]           Emotional Assessment Appearance:: Appears stated age Attitude/Demeanor/Rapport: Engaged Affect (typically observed): Appropriate Orientation: : Oriented to Self, Oriented to Place, Oriented to  Time, Oriented to Situation      Admission diagnosis:  Cellulitis [L03.90] Wound of left lower extremity, initial encounter [S81.802A] Patient Active Problem List   Diagnosis Date Noted   Wound of left leg 01/07/2023   Cellulitis 01/06/2023   Pressure injury of skin 01/06/2023   Intertrigo 01/06/2023   S/P TAVR (transcatheter aortic valve replacement) 05/25/2022   Nausea and vomiting  05/18/2022   Severe aortic stenosis    Depression 02/12/2017   Anxiety 02/12/2017   Hypokalemia 02/12/2017   Biliary colic    Hypothyroidism    Hyperlipidemia    KNEE, ARTHRITIS, DEGEN./OSTEO 05/14/2010   PCP:  Assunta Found, MD Pharmacy:   [facility] - [place], [place] - [phone] [place] [address] Phone: [phone] Fax: [phone]  [facility] [address] [place] [address] Phone: [phone] Fax: [phone]     Social Determinants of Health (SDOH) Social History: SDOH Screenings   Food Insecurity: No Food Insecurity (01/06/2023)  Housing: Low Risk  (01/06/2023)  Transportation Needs: No Transportation Needs (01/06/2023)  Utilities: Not At Risk (01/06/2023)  Tobacco Use: Medium Risk (01/06/2023)   SDOH Interventions:     Readmission Risk Interventions    05/26/2022   11:22 AM  Readmission Risk Prevention Plan  Post Dischage Appt Complete  Medication Screening Complete  Transportation Screening Complete

## 2023-01-07 NOTE — TOC Progression Note (Signed)
Transition of Care [facility]) - Progression Note    Patient Details  Name: [name] MRN: [id number] Date of Birth: July 13, 1933  [facility] CM/SW Contact  Epifanio Lesches, RN Phone Number: 01/07/2023, 2:58 PM  Clinical Narrative:    NCM spoke with pt regarding disposition needs. Pt declined SNF placement. Agreeable to home health services. Pt without provider preference. Referral made with [facility] and accepted pending MD's order. Order/ face to face will be needed from MD for home health services ( RN,PT,OT,SW).  TOC team following and will continue assisting with needs....  Expected Discharge Plan: Home w Home Health Services Barriers to Discharge: Continued Medical Work up  Expected Discharge Plan and Services In-house Referral: Clinical Social Work Discharge Planning Services: CM Consult Post Acute Care Choice: Home Health Living arrangements for the past 2 months: Single Family Home                           HH Arranged: RN, PT, OT, Social Work [employer] Agency: [facility] Date HH Agency Contacted: 01/07/23 Time HH Agency Contacted: 1454 Representative spoke with at [facility] Agency: Tresa Endo   Social Determinants of Health (SDOH) Interventions SDOH Screenings   Food Insecurity: No Food Insecurity (01/06/2023)  Housing: Low Risk  (01/06/2023)  Transportation Needs: No Transportation Needs (01/06/2023)  Utilities: Not At Risk (01/06/2023)  Tobacco Use: Medium Risk (01/06/2023)    Readmission Risk Interventions    05/26/2022   11:22 AM  Readmission Risk Prevention Plan  Post Dischage Appt Complete  Medication Screening Complete  Transportation Screening Complete

## 2023-01-07 NOTE — Consult Note (Addendum)
WOC Nurse Consult Note: patient with longstanding issues with intertriginous dermatitis under breasts and pannus; has neuropathy in feet "I haven't felt anything in my feet for years".  Reason for Consult: bilateral leg wounds, intertriginous dermatitis, stage 2 L buttock  Wound type:  Stage 2 Pressure Injury L buttock 0.5 cms x 0.5 cms x 0.1 cm 100% pink and moist  Intertriginous dermatitis underneath bilateral breasts, pannus, and bilateral groin  erythema with scattered areas of partial thickness skin loss  ICD-10 CM Codes for Irritant Dermatitis L30.4  - Erythema intertrigo. Also used for abrasion of the hand, chafing of the skin, dermatitis due to sweating and friction, friction dermatitis, friction eczema, and genital/thigh intertrigo.  Moisture Associated Skin Damage buttocks with erythema and maceration noted, patient states she is incontinent and utilizing depends and purewick at home for management  ICD-10 CM Codes for Irritant Dermatitis L24A2 - Due to fecal, urinary or dual incontinence Lower legs with scattered areas of partial thickness skin loss, likely venous in nature.  Legs with erythema and mild edema noted.  Areas are dry, no open draining wounds at this visit.  R foot between 2nd and 3rd digits full thickness tissue loss, 3 cms x 3 cms x 0.1 cm 50% pink moist 50% yellow  Neuropathic ulcers plantar left foot and all toes, all dry eschar, heavily callused surrounding  Intact blood filled blister to R great toe 0.5 cms x 0.5 cms   Pressure Injury POA: yes    Drainage (amount, consistency, odor) minimal serosanguinous L buttock stage 2, serous from pannus and breasts, serosanguinous between 2nd and 3rd digits R foot  Periwound: erythema and edema to legs  Dressing procedure/placement/frequency:  Stage 2 Pressure injury cover with foam dressing, lift foam qday to assess. Change foam dressing q3 days and prn soiling.  Interdry to bilateral breasts and pannus as follows:  Clean  areas with soap and water, dry thoroughly.  DO NOT PLACE ANY POWDERS OR LOTIONS IN THESE AREAS.  Instead place Interdry Minda Meo [place] [phone]):  Measure and cut length of InterDry to fit in skin folds that have skin breakdown Tuck InterDry fabric into skin folds in a single layer, allow for 2 inches of overhang from skin edges to allow for wicking to occur May remove to bathe; dry area thoroughly and then tuck into affected areas again Do not apply any creams or ointments when using InterDry DO NOT THROW AWAY FOR 5 DAYS unless soiled with stool DO NOT [facility] product, this will inactivate the silver in the material  New sheet of Interdry should be applied after 5 days of use if patient continues to have skin breakdown   3.  MASD to buttocks and perineum clean with soap and water, dry and apply Desitin in a thin layer to entire area 3 times a day and prn soiling.   4.  Lower legs cover anterior legs with single layer Xeroform gauze [place] [phone]) daily,  cover with ABD pad or Telfa pad.  Wrap bilateral legs with kerlix gauze starting from just above toe and ending right below knee.  Secure with Ace wrap in same fashion.    5.  R foot 2nd and 3rd digits clean with NS, place Silver Hydrofiber cut to fit wound bed daily [place] [phone]), secure with Kerlix and tape.   6.  L plantar foot and toes, R great toe paint with Betadine twice daily and leave open to air.    POC discussed with patient and bedside nurse.  WOC will not follow at this time.  Re-consult if further needs arise.   I did discuss care of pannus and breasts once patient goes home. Keeping these areas dry and preventing tissues from rubbing together are the key to healing these areas.    Thank you,    Priscella Mann MSN, RN-BC, [employer] [phone]

## 2023-01-07 NOTE — Evaluation (Signed)
Occupational Therapy Evaluation Patient Details Name: [name] MRN: [id number] DOB: [birth year] Today's Date: 01/07/2023   History of Present Illness Pt is an 89 y.o. female presenting 4/11 with toe bleeding.  Found to have cellulitis. PMH significant for HTN, HLD, HFpEF, depression, anxiety, hypothyroidism.   Clinical Impression   PTA, pt lived alone and performed ADL and IADL with mod I as best she could; received assistance with washing dishes and transferring clothing from washing machine to dryer. Upon eval, pt likely near baseline. OT with concerns regarding pt's hygiene and skin breakdown due to pt reports that she has had to modify several daily activities to remain independent (see PLOF section). Educated regarding weight shifts and decreasing friction during lateral scooting with transfer board. Pt to benefit from skilled OT services acutely and post acute <3 hours/day. Likely to refuse and if so, consider maximizing HHOT services. Could also benefit from an aide for a few hours per week in the home setting.      Recommendations for follow up therapy are one component of a multi-disciplinary discharge planning process, led by the attending physician.  Recommendations may be updated based on patient status, additional functional criteria and insurance authorization.   Assistance Recommended at Discharge Intermittent Supervision/Assistance  Patient can return home with the following A little help with walking and/or transfers;A little help with bathing/dressing/bathroom;Assistance with cooking/housework;Assist for transportation;Help with stairs or ramp for entrance    Functional Status Assessment  Patient has had a recent decline in their functional status and demonstrates the ability to make significant improvements in function in a reasonable and predictable amount of time.  Equipment Recommendations  Other (comment);Tub/shower bench (drop arm BSC)    Recommendations for  Other Services       Precautions / Restrictions Precautions Precautions: Fall Restrictions Weight Bearing Restrictions: No      Mobility Bed Mobility               General bed mobility comments: EOB eating lunch with NT present on departure; in chair on arrival    Transfers Overall transfer level: Needs assistance Equipment used: Sliding board Transfers: Bed to chair/wheelchair/BSC            Lateral/Scoot Transfers: Min assist General transfer comment: with transfer board. Min guard A for positioning of board and scooting over, but min A to get board out from under her hip.      Balance Overall balance assessment: Needs assistance Sitting-balance support: No upper extremity supported, Feet supported Sitting balance-Leahy Scale: Good Sitting balance - Comments: statically                                   ADL either performed or assessed with clinical judgement   ADL Overall ADL's : Needs assistance/impaired Eating/Feeding: Modified independent;Sitting   Grooming: Modified independent;Sitting   Upper Body Bathing: Set up;Sitting   Lower Body Bathing: Moderate assistance;Sitting/lateral leans   Upper Body Dressing : Set up;Sitting   Lower Body Dressing: Moderate assistance;Sitting/lateral leans   Toilet Transfer: Freight forwarder ADL Comments: Pt uses wheelchair at baseline and sleeps in a recliner     Vision Baseline Vision/History: 1 Wears glasses Ability to See in Adequate Light: 0 Adequate Patient Visual Report: No change from baseline Vision Assessment?: No apparent visual deficits     Perception Perception Perception Tested?: No  Praxis Praxis Praxis tested?: Within functional limits    Pertinent Vitals/Pain Pain Assessment Pain Assessment: Faces Faces Pain Scale: Hurts little more Pain Location: knees Pain Descriptors / Indicators: Aching Pain Intervention(s): Limited activity within  patient's tolerance, Monitored during session     Hand Dominance Right   Extremity/Trunk Assessment Upper Extremity Assessment Upper Extremity Assessment: RUE deficits/detail;LUE deficits/detail;Generalized weakness RUE Deficits / Details: able to perform shoulder flexdion ~10 degrees. RUE Coordination: decreased gross motor LUE Deficits / Details: Able to perform shoulder flexion ~90 degrees   Lower Extremity Assessment Lower Extremity Assessment: Defer to PT evaluation       Communication Communication Communication: No difficulties   Cognition Arousal/Alertness: Awake/alert Behavior During Therapy: WFL for tasks assessed/performed Overall Cognitive Status: Within Functional Limits for tasks assessed                                 General Comments: Providing thorough history.     General Comments  educating regarding pressure relief as pt reports skin breakdown due to frequent slide board use.    Exercises     Shoulder Instructions      Home Living Family/patient expects to be discharged to:: Private residence Living Arrangements: Alone Available Help at Discharge: Family;Available PRN/intermittently Type of Home: House Home Access: Ramped entrance     Home Layout: One level     Bathroom Shower/Tub: Chief Strategy Officer: Handicapped height Bathroom Accessibility: Yes   Home Equipment: Agricultural consultant (2 wheels);Rollator (4 wheels);Standard Walker;Wheelchair - manual (Pt reports she tad tub bench, but it was too unstable to hold her)   Additional Comments: Waste management picks up her trash for her, One daughter nearby, limited help. Uses a transportation service for MD appointments.      Prior Functioning/Environment Prior Level of Function : Independent/Modified Independent             Mobility Comments: States she uses w/c and lateral scoots, has not stood since 2011. ADLs Comments: Per pt, she performs her own cooking  and light cleaning. Pays someone to wash dishes and move clothing from washer to dryer. Pt reports that she wears shirts that are too long for her and is able to pull her pants up to her upper thighs, but is unable to get them around her bottom. Takes bird bath, has not been in bathtub for > 1 year. Pt sleeps in recliner and her whceelchair does not have arm on L; she transfers toward L        OT Problem List: Decreased strength;Decreased activity tolerance;Decreased safety awareness;Decreased knowledge of use of DME or AE;Impaired UE functional use      OT Treatment/Interventions: Self-care/ADL training;Therapeutic exercise;DME and/or AE instruction;Patient/family education;Balance training;Therapeutic activities    OT Goals(Current goals can be found in the care plan section) Acute Rehab OT Goals Patient Stated Goal: return home OT Goal Formulation: With patient Time For Goal Achievement: 01/21/23 Potential to Achieve Goals: Good  OT Frequency: Min 2X/week    Co-evaluation              AM-PAC OT "6 Clicks" Daily Activity     Outcome Measure Help from another person eating meals?: None Help from another person taking care of personal grooming?: A Little Help from another person toileting, which includes using toliet, bedpan, or urinal?: A Little Help from another person bathing (including washing, rinsing, drying)?: A Little Help from another person to  put on and taking off regular upper body clothing?: A Little Help from another person to put on and taking off regular lower body clothing?: A Lot 6 Click Score: 18   End of Session Equipment Utilized During Treatment: Gait belt;Other (comment) (transfer board) Nurse Communication: Mobility status  Activity Tolerance: Patient tolerated treatment well Patient left: in bed;with call bell/phone within reach;with nursing/sitter in room  OT Visit Diagnosis: Muscle weakness (generalized) (M62.81);Pain                Time:  1610-9604 OT Time Calculation (min): 19 min Charges:  OT General Charges $OT Visit: 1 Visit OT Evaluation $OT Eval Low Complexity: 1 Low  Tyler Deis, OTR/L [facility] Acute Rehabilitation Office: [phone]   Myrla Halsted 01/07/2023, 2:23 PM

## 2023-01-07 NOTE — Progress Notes (Signed)
Daily Progress Note Intern Pager: [phone]  Patient name: [name] Medical record number: [id number] Date of birth: 05-03-1933 Age: 89 y.o. Gender: female  Primary Care Provider: Assunta Found, MD Consultants: None Code Status: FULL which was confirmed with family if patient unable to confirm   Preferred Emergency Contact: [phone] [name] (Daughter)  Pt Overview and Major Events to Date:  4/12 - Admission  Assessment and Plan: [name] is a 89 y.o. female presenting with fluid leaking from her feet. Differential for this patient's presentation of this includes cellulitis or venous stasis ulcer.  Most likely cellulitis as there is purulent discharge. * Cellulitis Patient presenting with right foot with purulent discharge, erythema, and increased edema on admission.  She denies any pain in her feet, has new wound dressings, no discharge appreciated on exam.  A1c 5.1% (01/07/2023).  Has previously failed outpatient antibiotic therapy with amoxicillin Ceftriaxone. - Continue vancomycin, dosing per pharmacy   -Wound care - F/u Bcx  -AM CBC and BMP - F/u PT/OT recs - Monitor progress of erythema and edema   Intertrigo Significant intertrigo beneath breasts and pannus. Has tried nystatin powder.  - Clotrimazole cream   - Continue to monitor   Pressure injury of skin Patient with some redness near the sacrum. No open wound. She does use a wheel chair and has difficulty with mobility.  - F/u wound care recs - Frequent assistance with repositioning.   Hypokalemia K 3.1 on admission, repleted in the ED. Repeat BMP showed K+ 3.3, gave another 60 mEq. Most likely secondary to lasix use. Takes supplemental potassium outpatient.  - BMP tomorrow AM  - Replete as necessary      Chronic Conditions:  HFpEF-continue Lasix 60 mg p.o. daily Hypothyroidism-continue Synthroid 150 mcg Depression-continue paroxetine 20 mg daily   FEN/GI: Regular VTE Prophylaxis:  Lovenox Dispo:Home pending clinical improvement .   Subjective:  Patient at bedside, no acute complaints.  Denies any pain in her legs.  Denies any fevers or chills.  Objective: Temp:  [97.9 F (36.6 C)-98.8 F (37.1 C)] 98.4 F (36.9 C) (04/12 0807) Pulse Rate:  [68-91] 68 (04/12 0807) Resp:  [15-18] 16 (04/12 0807) BP: (128-170)/(46-94) 149/57 (04/12 0807) SpO2:  [82 %-100 %] 94 % (04/12 0807) Weight:  [82.9 kg-90.7 kg] 82.9 kg (04/11 2259) Physical Exam: General: Elderly woman, NAD Cardiovascular: RRR Respiratory: Normal work of breathing on room air, CTAB Abdomen: Nontender, nondistended. DERM: Bilateral lower extremity edema and erythema more prominent on right than left.  Right first 3 toes appear dry.  There is minimal drainage of RLE, wound dressings appear clean.  There is some erythema near the sacral area, no ulcers appreciated. Breast-there is significant bilateral erythema beneath the breasts  Laboratory: Most recent CBC Lab Results  Component Value Date   WBC 8.8 01/07/2023   HGB 10.1 (L) 01/07/2023   HCT 31.0 (L) 01/07/2023   MCV 87.6 01/07/2023   PLT 257 01/07/2023   Most recent BMP    Latest Ref Rng & Units 01/07/2023   12:29 AM  BMP  Glucose 70 - 99 mg/dL 962   BUN 8 - 23 mg/dL 20   Creatinine 9.52 - 1.00 mg/dL 8.41   Sodium 324 - 401 mmol/L 138   Potassium 3.5 - 5.1 mmol/L 3.3   Chloride 98 - 111 mmol/L 101   CO2 22 - 32 mmol/L 27   Calcium 8.9 - 10.3 mg/dL 8.0      Imaging/Diagnostic Tests: DG  Foot Complete Right:  1. No acute osseous abnormality or radiographic finding of soft tissue ulceration. 2. Ovoid radiodensity projecting over the plantar aspect of the great toe may reflect a foreign body, possibly external to the patient and would be amenable to direct inspection.   Lorri Frederick, MD 01/07/2023, 1:53 PM  PGY-1, [facility] Family Medicine FPTS Intern pager: [phone], text pages welcome Secure chat group [facility] Teaching Service

## 2023-01-08 ENCOUNTER — Encounter (HOSPITAL_COMMUNITY): Payer: Self-pay | Admitting: Family Medicine

## 2023-01-08 DIAGNOSIS — L03115 Cellulitis of right lower limb: Secondary | ICD-10-CM

## 2023-01-08 LAB — BASIC METABOLIC PANEL
Anion gap: 11 (ref 5–15)
BUN: 31 mg/dL — ABNORMAL HIGH (ref 8–23)
CO2: 28 mmol/L (ref 22–32)
Calcium: 8.3 mg/dL — ABNORMAL LOW (ref 8.9–10.3)
Chloride: 100 mmol/L (ref 98–111)
Creatinine, Ser: 1.05 mg/dL — ABNORMAL HIGH (ref 0.44–1.00)
GFR, Estimated: 51 mL/min — ABNORMAL LOW (ref >60)
Glucose, Bld: 83 mg/dL (ref 70–99)
Potassium: 4 mmol/L (ref 3.5–5.1)
Sodium: 139 mmol/L (ref 135–145)

## 2023-01-08 LAB — VANCOMYCIN, RANDOM: Vancomycin Rm: 19 ug/mL

## 2023-01-08 MED ORDER — CEPHALEXIN 500 MG PO CAPS: 500.0000 mg | ORAL_CAPSULE | Freq: Two times a day (BID) | ORAL | 0 refills | Status: AC

## 2023-01-08 MED ORDER — DOXYCYCLINE HYCLATE 100 MG PO CAPS: 100.0000 mg | ORAL_CAPSULE | Freq: Two times a day (BID) | ORAL | 0 refills | Status: DC

## 2023-01-08 MED ORDER — ACETAMINOPHEN 325 MG PO TABS: 650.0000 mg | ORAL_TABLET | Freq: Four times a day (QID) | ORAL | Status: DC | PRN

## 2023-01-08 MED ORDER — DOXYCYCLINE HYCLATE 100 MG PO CAPS: 100.0000 mg | ORAL_CAPSULE | Freq: Two times a day (BID) | ORAL | 0 refills | Status: AC

## 2023-01-08 MED ORDER — CEPHALEXIN 500 MG PO CAPS: 500.0000 mg | ORAL_CAPSULE | Freq: Two times a day (BID) | ORAL | 0 refills | Status: DC

## 2023-01-08 MED ORDER — VANCOMYCIN HCL 750 MG/150ML IV SOLN
750.0000 mg | INTRAVENOUS | Status: DC
  Administered 2023-01-08: 750 mg via INTRAVENOUS
  Filled 2023-01-08: qty 150

## 2023-01-08 NOTE — Discharge Instructions (Addendum)
Dear Daleen Bo,  Thank you for letting us participate in your care. You were hospitalized for and infection and diagnosed with Cellulitis. You were treated with IV antibiotics. You last dose of IV antibiotics was today, 01/08/23 so you don't need to start your oral antibiotics until tomorrow. They have been sent to your pharmacy.   POST-HOSPITAL & CARE INSTRUCTIONS Take Doxycycline 100 mg (1 tablet) twice a day for 4 days starting tomorrow.  Also take Keflex 500 mg (1 tablet) twice a day for 4 days starting tomorrow.  Please make an appointment to follow up with your primary care provider in the next 2-3 days to ensure you are continuing to improve   DOCTOR'S APPOINTMENT   Future Appointments  Date Time Provider Department Center  05/25/2023  2:00 PM [facility] ECHO 4 [facility]  05/25/2023  3:10 PM [facility]  05/27/2023  1:00 PM Jonelle Sidle, MD [facility]    Follow-up Information     Assunta Found, MD Follow up.   Specialty: Family Medicine Contact information: [address] [place] [address] [phone]         [facility] Follow up.   Specialty: Home Health Services Why: home health service will be provided by [facility], start of care within 48 hours post discharge Contact information: [address] [place] [address] [phone]                 Take care and be well!  Family Medicine Teaching Service Inpatient Team [place]  [facility]  [address] [place], [place] [address] [phone]

## 2023-01-08 NOTE — Discharge Summary (Signed)
Family Medicine Teaching [facility] Discharge Summary  Patient name: [name] Medical record number: [id number] Date of birth: [birth year] Age: 89 y.o. Gender: female Date of Admission: 01/06/2023  Date of Discharge: 01/08/2023 Admitting Physician: Lockie Mola, MD  Primary Care Provider: Assunta Found, MD Consultants: None  Indication for Hospitalization: Cellulitis   Discharge Diagnoses/Problem List:  Principal Problem:   Cellulitis Active Problems:   Pressure ulcer of sacral region, stage 2   Intertriginous dermatitis associated with moisture    Disposition: Home  Discharge Condition: Stable  Discharge Exam: taken from day progress note General: Well appearing, in no distress Cardiovascular: RRR, cap refill < 2 seconds Respiratory: Normal work of breathing on room air  Abdomen: Soft, non distended, non tender Extremities: Both lower extremities wrapped in dressing from ankles to about 3 inches below the knee. R leg slightly more edematous, non pitting. Mild erythema proximal to bandage on right lower extremity. Bandage clean, dry. No pain to palpation of lower extremities. Right foot wound covered in bandage. Clean and dry.   Brief Hospital Course:  [name] is a 89 y.o.female with a history of HFpEF, venous stasis, hypothyroidism, MDD  who was admitted to the [facility] Teaching Service at [facility] for cellulitis. Her hospital course is detailed below:  Cellulitis of R foot  Patient presented with fluid leakage from R foot. On admission patient was hemodynamically stable, afebrile, without leukocytosis. However, had purulent discharge from foot with edema and erythema. She had failed outpatient therapy after 3 days of amoxicillin. Wound care was consulted while admitted. She was started on vancomycin. Blood cultures showed no growth at 2 days. She was discharged with oral doxycycline and keflex to complete at 7 day total course of antibiotics. Home health OT,  PT, and aide were ordered at discharge.    Intertrigo  Patient had significant intertrigo beneath breasts and pannus on admission. She had tried nystatin powder outpatient. Wound care was consulted and started interdry.   Other chronic conditions were medically managed with home medications and formulary alternatives as necessary (HFpEF, MDD, Hypothyroidism)  PCP Follow-up Recommendations: F/u pressure ulcer and wounds  F/u intertrigo  Ensure continued improvement of cellulitis  Ensure pt is receiving home health OT, PT, and aide if desired     Significant Procedures: None  Significant Labs and Imaging:  Recent Labs  Lab 01/06/23 1701 01/07/23 0029  WBC 8.3 8.8  HGB 11.1* 10.1*  HCT 34.0* 31.0*  PLT 270 257   Recent Labs  Lab 01/06/23 1701 01/07/23 0029 01/08/23 0119  NA 139 138 139  K 3.1* 3.3* 4.0  CL 101 101 100  CO2 28 27 28   GLUCOSE 82 112* 83  BUN 25* 20 31*  CREATININE 1.04* 0.83 1.05*  CALCIUM 8.5* 8.0* 8.3*  ALKPHOS 76  --   --   AST 28  --   --   ALT 13  --   --   ALBUMIN 3.2*  --   --       Results/Tests Pending at Time of Discharge: none  Discharge Medications:  Allergies as of 01/08/2023   No Active Allergies      Medication List     STOP taking these medications    amoxicillin 875 MG tablet Commonly known as: AMOXIL   ibuprofen 200 MG tablet Commonly known as: ADVIL   mupirocin ointment 2 % Commonly known as: BACTROBAN       TAKE these medications    acetaminophen 325 MG tablet  Commonly known as: TYLENOL Take 2 tablets (650 mg total) by mouth every 6 (six) hours as needed for mild pain (or Fever >/= 101).   cephALEXin 500 MG capsule Commonly known as: KEFLEX Take 1 capsule (500 mg total) by mouth 2 (two) times daily for 4 days.   doxycycline 100 MG capsule Commonly known as: VIBRAMYCIN Take 1 capsule (100 mg total) by mouth 2 (two) times daily for 4 days.   furosemide 40 MG tablet Commonly known as: LASIX Take 1.5  tablets (60 mg total) by mouth daily.   levothyroxine 150 MCG tablet Commonly known as: SYNTHROID Take 150 mcg by mouth daily before breakfast.   nitroGLYCERIN 0.4 MG SL tablet Commonly known as: NITROSTAT Place 1 tablet (0.4 mg total) under the tongue every 5 (five) minutes as needed for chest pain.   oxybutynin 15 MG 24 hr tablet Commonly known as: DITROPAN XL Take 15 mg by mouth daily.   PARoxetine 20 MG tablet Commonly known as: PAXIL Take 20 mg by mouth daily.   potassium chloride SA 20 MEQ tablet Commonly known as: KLOR-CON M Take 1.5 tablets (30 mEq total) by mouth daily.        Discharge Instructions: Please refer to Patient Instructions section of EMR for full details.  Patient was counseled important signs and symptoms that should prompt return to medical care, changes in medications, dietary instructions, activity restrictions, and follow up appointments.   Follow-Up Appointments:  Follow-up Information     Assunta Found, MD. Schedule an appointment as soon as possible for a visit in 3 day(s).   Specialty: Family Medicine Contact information: [address] [place] [address] [phone]         [facility] Follow up.   Specialty: Home Health Services Why: home health service will be provided by [facility], start of care within 48 hours post discharge Contact information: [address] [place] [address] [phone]                 Erick Alley, DO 01/08/2023, 3:30 PM PGY-2, [place] [facility]

## 2023-01-08 NOTE — Plan of Care (Signed)

## 2023-01-08 NOTE — Progress Notes (Signed)
Pharmacy Antibiotic Note  [name] is a 89 y.o. female admitted on 01/06/2023 with cellulitis. Pharmacy has been consulted for vancomycin dosing.  Vancomycin level 19 this AM  Plan: Vancomycin 750 mg iv Q 24 hours Follow for improvement  Height: 5\' 9"  (175.3 cm) Weight: 82.9 kg (182 lb 12.2 oz) IBW/kg (Calculated) : 66.2  Temp (24hrs), Avg:98 F (36.7 C), Min:97.9 F (36.6 C), Max:98.1 F (36.7 C)  Recent Labs  Lab 01/06/23 1701 01/07/23 0029 01/08/23 0119  WBC 8.3 8.8  --   CREATININE 1.04* 0.83 1.05*  VANCORANDOM  --   --  19     Estimated Creatinine Clearance: 42.6 mL/min (A) (by C-G formula based on SCr of 1.05 mg/dL (H)).    No Active Allergies  Thank you Okey Regal, PharmD  4/13/20248:15 AM

## 2023-01-08 NOTE — Progress Notes (Signed)
Patient being discharged home. Discharge instructions reviewed with patient and her daughter. Both verbalized full understanding. Patient daughter is her ride home.

## 2023-01-08 NOTE — Progress Notes (Signed)
Daily Progress Note Intern Pager: [phone]  Patient name: [name] Medical record number: [id number] Date of birth: 06/26/1933 Age: 89 y.o. Gender: female  Primary Care Provider: Assunta Found, MD Consultants: None Code Status: FULL which was confirmed with family if patient unable to confirm   Preferred Emergency Contact: [phone] [name] (Daughter)   Pt Overview and Major Events to Date:  4/12 - Admission, started on Vancomycin    Assessment and Plan: [name] is a 89 y.o. female presenting with fluid leaking from her feet. Differential for this patient's presentation of this includes cellulitis or venous stasis ulcer.  Most likely cellulitis as there is purulent discharge.  PMH/PSH significant for HFpEF, aortic stenosis s/p TAVR, HFpEF, hypothyroidism s/p thyroidectomy.   * Cellulitis Patient presenting with right foot with purulent discharge, erythema, and increased edema on admission. Patient has long standing neuropathy and venous stasis. Previously failed outpatient antibiotic therapy with amoxicillin and ceftriaxone. Blood cultures no growth for over 12 hours.  - Continue vancomycin, dosing per pharmacy   - Wound care instructions:  - Lower legs cover anterior legs with single layer Xeroform gauze [place] [phone]) daily,  cover with ABD pad or Telfa pad.  Wrap bilateral legs with kerlix gauze starting from just above toe and ending right below knee.  Secure with Ace wrap in same fashion.    - R foot 2nd and 3rd digits clean with NS, place Silver Hydrofiber cut to fit wound bed daily [place] [phone]), secure with Kerlix and tape.   - L plantar foot and toes, R great toe paint with Betadine twice daily and leave open to air.   - F/u Bcx  - AM CBC and BMP  - F/u PT/OT recs - Monitor progress of erythema and edema   Intertriginous dermatitis associated with moisture Significant intertrigo beneath breasts and pannus.  - Interdry to bilateral breasts and  pannus as follows: Clean areas with soap and water, dry thoroughly. DO NOT PLACE ANY POWDERS OR LOTIONS IN THESE AREAS. Instead place Interdry AG [place] [phone]) - Additional details in wound care note.   Pressure ulcer of sacral region, stage 2 Most likely due to wheel chair use and difficulty with positioning, poor mobility.  - Cover with foam dressing, lift foam qday to assess. Change foam dressing q3 days and prn soiling.  - Frequent assistance with repositioning.   Hypokalemia-resolved as of 01/08/2023 K 4.0 today.    Chronic Conditions:  HFpEF-continue Lasix 60 mg p.o. daily Hypothyroidism-continue Synthroid 150 mcg Depression-continue paroxetine 20 mg daily  FEN/GI: Regular PPx: lovenox  Dispo:Home with Home health   Subjective:  Patient reports feeling well and having no complaints. Denies any pain.   Objective: Temp:  [97.9 F (36.6 C)-98.8 F (37.1 C)] 97.9 F (36.6 C) (04/12 1939) Pulse Rate:  [68-89] 88 (04/12 1939) Resp:  [16-18] 18 (04/12 1939) BP: (104-149)/(54-70) 104/67 (04/12 1939) SpO2:  [92 %-99 %] 98 % (04/12 1939) Physical Exam: General: Well appearing, in no distress Cardiovascular: RRR, cap refill < 2 seconds Respiratory: Normal work of breathing on room air  Abdomen: Soft, non distended, non tender Extremities: Both lower extremities wrapped in dressing from ankles to about 3 inches below the knee. R leg slightly more edematous, non pitting. Mild erythema proximal to bandage on right lower extremity. Bandage clean, dry. No pain to palpation of lower extremities. Right foot wound covered in bandage. Clean and dry.   Laboratory: Most recent CBC Lab Results  Component  Value Date   WBC 8.8 01/07/2023   HGB 10.1 (L) 01/07/2023   HCT 31.0 (L) 01/07/2023   MCV 87.6 01/07/2023   PLT 257 01/07/2023   Most recent BMP    Latest Ref Rng & Units 01/08/2023    1:19 AM  BMP  Glucose 70 - 99 mg/dL 83   BUN 8 - 23 mg/dL 31   Creatinine 1.61 - 1.00 mg/dL  0.96   Sodium 045 - 409 mmol/L 139   Potassium 3.5 - 5.1 mmol/L 4.0   Chloride 98 - 111 mmol/L 100   CO2 22 - 32 mmol/L 28   Calcium 8.9 - 10.3 mg/dL 8.3     Other pertinent labs Blood cultures no growth > 12 hours    Lockie Mola, MD 01/08/2023, 2:31 AM  PGY-1, [facility] FPTS Intern pager: [phone], text pages welcome Secure chat group [facility] Teaching Service

## 2023-01-10 ENCOUNTER — Ambulatory Visit (HOSPITAL_COMMUNITY): Payer: 59 | Admitting: Physical Therapy

## 2023-01-11 ENCOUNTER — Telehealth: Payer: Self-pay

## 2023-01-11 DIAGNOSIS — E785 Hyperlipidemia, unspecified: Secondary | ICD-10-CM

## 2023-01-11 LAB — CULTURE, BLOOD (ROUTINE X 2)
Culture: NO GROWTH
Culture: NO GROWTH
Special Requests: ADEQUATE
Special Requests: ADEQUATE

## 2023-01-11 NOTE — Consult Note (Signed)
   [facility] CM Inpatient Consult   01/11/2023  KEZIA BENEVIDES 1932/11/25 [id number]  Follow up:  Post hospital referral   Patient transitioned home over the weekend.  Call attempt to patient's phone which rang several times. Called cell phone listed and daughter, [name], answered, HIPAA verified and explained reason for call and will confirm [facility] Care Coordinator for post hospital follow up.  Daughter concerns for mother to have hot meals [will request check for benefits with [employer]]  Referral request made for post hospital follow up.  Charlesetta Shanks, RN BSN CCM [facility]  [phone] business mobile phone Toll free office [phone]  *Concierge Line  [phone] Fax number: [phone] [email or web address]

## 2023-01-12 ENCOUNTER — Ambulatory Visit (HOSPITAL_COMMUNITY): Payer: 59 | Admitting: Physical Therapy

## 2023-01-12 ENCOUNTER — Telehealth: Payer: Self-pay | Admitting: *Deleted

## 2023-01-12 NOTE — Progress Notes (Signed)
  Care Coordination   Note   01/12/2023 Name: [name] MRN: [id number] DOB: 1933/07/15  [name] is a 89 y.o. year old female who sees Assunta Found, MD for primary care. I reached out to Daleen Bo by phone today to offer care coordination services.  [name] was given information about Care Coordination services today including:   The Care Coordination services include support from the care team which includes your Nurse Coordinator, Clinical Social Worker, or Pharmacist.  The Care Coordination team is here to help remove barriers to the health concerns and goals most important to you. Care Coordination services are voluntary, and the patient may decline or stop services at any time by request to their care team member.   Care Coordination Consent Status: Patient agreed to services and verbal consent obtained.   Follow up plan:  Telephone appointment with care coordination team member scheduled for:  01/14/23  Encounter Outcome:  Pt. Scheduled  [facility] Coordination Care Guide  Direct Dial: [phone]

## 2023-01-14 ENCOUNTER — Encounter: Payer: Self-pay | Admitting: *Deleted

## 2023-01-14 ENCOUNTER — Ambulatory Visit (HOSPITAL_COMMUNITY): Payer: 59 | Admitting: Physical Therapy

## 2023-01-14 ENCOUNTER — Telehealth: Payer: Self-pay | Admitting: *Deleted

## 2023-01-14 ENCOUNTER — Ambulatory Visit: Payer: Self-pay | Admitting: *Deleted

## 2023-01-14 DIAGNOSIS — S80812A Abrasion, left lower leg, initial encounter: Secondary | ICD-10-CM | POA: Diagnosis not present

## 2023-01-14 DIAGNOSIS — L03115 Cellulitis of right lower limb: Secondary | ICD-10-CM | POA: Diagnosis not present

## 2023-01-14 NOTE — Patient Outreach (Signed)
  Care Coordination   01/14/2023 Name: [name] MRN: [id number] DOB: 1932/10/04   Care Coordination Outreach Attempts:  An unsuccessful telephone outreach was attempted for a scheduled appointment today.  Follow Up Plan:  Additional outreach attempts will be made to offer the patient care coordination information and services.   Encounter Outcome:  No Answer. Left HIPAA compliant VM   Care Coordination Interventions:  Yes, provided   Interventions Today    Flowsheet Row Most Recent Value  Chronic Disease   Chronic disease during today's visit Other  [Recent hospitalization. Could use easy meals per daughter]  General Interventions   General Interventions Discussed/Reviewed Communication with  Communication with --  [talked with Gwenevere Ghazi, Care Guide regarding Mom's Meals. Daughter is interested. Patient originally declined. Would like to discuss further with patient. Referrral on hold for now.]      Demetrios Loll, BSN, RN-BC RN Care Coordinator [facility]  [facility] Direct Dial: [phone] Main #: [phone]

## 2023-01-14 NOTE — Patient Outreach (Signed)
  Care Coordination   Follow Up Visit Note   01/14/2023 Name: [name] MRN: [id number] DOB: Feb 26, 1933  [name] is a 89 y.o. year old female who sees Assunta Found, MD for primary care. I  spoke with patient' daughter, [name], by telephone today  What matters to the patients health and wellness today?  Discuss Mom's Meals and other resources available    Goals Addressed             This Visit's Progress    Care Coordination Services       Care Coordination Goals: Patient will follow-up with PCP and/or specialist(s) as recommended Patient will take medications as prescribed Patient will reach out to RN Care Coordinator [phone] with any care coordination or resource needs Patient will talk with RN Care Coordinator by telephone on 01/18/23         SDOH assessments and interventions completed:  Yes SDOH Interventions Today    Flowsheet Row Most Recent Value  SDOH Interventions   Food Insecurity Interventions Intervention Not Indicated  Transportation Interventions Intervention Not Indicated       Care Coordination Interventions:  Yes, provided  Interventions Today    Flowsheet Row Most Recent Value  Chronic Disease   Chronic disease during today's visit Hypertension (HTN), Other  [recent hospitalization for wounds]  General Interventions   General Interventions Discussed/Reviewed Doctor Visits  Doctor Visits Discussed/Reviewed Doctor Visits Reviewed  [hospitalization notes and dishcarge instructions reviewed]  Education Interventions   Education Provided Provided Education  Provided Verbal Education On Development worker, community, [employer], When to see the doctor, Mental Health/Coping with Illness  [talked with daughter about Mom's Meals benefit through [facility] Dual Complete. Will talk directly with patient during scheduled telephone call on 01/18/23]  Nutrition Interventions   Nutrition Discussed/Reviewed Nutrition Discussed, Nutrition Reviewed        Follow up plan: Follow up call scheduled for 01/18/23    Encounter Outcome:  Pt. Visit Completed   Demetrios Loll, BSN, RN-BC RN Care Coordinator [facility]  [facility] Direct Dial: [phone] Main #: [phone]

## 2023-01-18 ENCOUNTER — Telehealth: Payer: Self-pay

## 2023-01-18 ENCOUNTER — Ambulatory Visit: Payer: Self-pay | Admitting: *Deleted

## 2023-01-18 ENCOUNTER — Encounter: Payer: Self-pay | Admitting: *Deleted

## 2023-01-18 NOTE — Telephone Encounter (Signed)
   Telephone encounter was:  Unsuccessful.  01/18/2023 Name: [name] MRN: [id number] DOB: Dec 03, 1932  Unsuccessful outbound call made today to assist with:   Mom's Meals referral.  Outreach Attempt:  1st Attempt  A HIPAA compliant voice message was left requesting a return call.  Instructed patient to call back at [phone]. Left message on daughter's voicemail to return my call regarding Mom's Meals for the patient. Emailed referral to [employer].   Caterra Ostroff [facility]  [facility] Population Health Community Resource Care Guide   [email or web address]  ?? [phone]   Website: [email or web address]  [email or web address]

## 2023-01-19 ENCOUNTER — Telehealth: Payer: Self-pay

## 2023-01-19 NOTE — Telephone Encounter (Signed)
   Telephone encounter was:  Successful.  01/19/2023 Name: [name] MRN: [id number] DOB: Jul 29, 1933  [name] is a 89 y.o. year old female who is a primary care patient of Assunta Found, MD . The community resource team was consulted for assistance with  Mom's Meals referral.  Care guide performed the following interventions: Spoke to patient's daughter [name] who stated that she actually needed me to contact the patient and try to get her to accept Mom's Meals.  Attempted to call the patient twice with no answer unable to leave a voicemail.   Follow Up Plan:  Care guide will follow up with patient by phone over the next 7 business days.  Denay Pleitez [facility]  [facility] Population Health Community Resource Care Guide   [email or web address]  ?? [phone]   Website: [email or web address]  [email or web address]

## 2023-01-20 ENCOUNTER — Telehealth: Payer: Self-pay

## 2023-01-20 NOTE — Telephone Encounter (Signed)
   Telephone encounter was:  Successful.  01/20/2023 Name: [name] MRN: [id number] DOB: 1933-05-26  [name] is a 89 y.o. year old female who is a primary care patient of Assunta Found, MD . The community resource team was consulted for assistance with  [employer].  Care guide performed the following interventions: Spoke with patient about Mom's Meals and she is interested in receiving them. A referral has been sent to [facility] Mom's Meals. She is also interested in a referral being sent to Meals on Wheels.  Follow Up Plan:   I will call [employer] Mom's Meals to confirm receipt of referral and call [facility] on Wheels to have patient placed on wait list.  Elvan Ebron [facility]  [facility] Population Health Community Resource Care Guide   [email or web address]  ?? [phone]   Website: [email or web address]  [email or web address]

## 2023-01-20 NOTE — Telephone Encounter (Signed)
   Telephone encounter was:  Successful.  01/20/2023 Name: [name] MRN: [id number] DOB: [birth year]  [name] is a 89 y.o. year old female who is a primary care patient of Assunta Found, MD . The community resource team was consulted for assistance with  [employer] and Meals on Wheels referral.  Care guide performed the following interventions: Spoke with April at [employer] she will reach out to patient to complete referral and start meals.  Spoke with Claris Gower at [employer] she will reach out to patient to complete intake and place patient on the wait list.   Follow Up Plan:  Care guide will follow up with patient by phone over the next 7 business days.  Aldea Avis [facility]  [facility] Population Health Community Resource Care Guide   [email or web address]  ?? [phone]   Website: [email or web address]  [email or web address]

## 2023-01-21 ENCOUNTER — Telehealth: Payer: Self-pay

## 2023-01-21 DIAGNOSIS — R5381 Other malaise: Secondary | ICD-10-CM | POA: Diagnosis not present

## 2023-01-21 DIAGNOSIS — R69 Illness, unspecified: Secondary | ICD-10-CM | POA: Diagnosis not present

## 2023-01-21 NOTE — Telephone Encounter (Signed)
   Telephone encounter was:  Successful.  01/21/2023 Name: [name] MRN: [id number] DOB: [birth year]  MURDIS FLITTON is a 89 y.o. year old female who is a primary care patient of Assunta Found, MD . The community resource team was consulted for assistance with  [employer] and Meals on Wheels.  Care guide performed the following interventions: Received email confirmation from [facility] Manager at [facility] Clinical Services/[employer] [phone] ext. [phone].  Patient was contacted on 01/20/23 and assisted with her post discharge meal benefits.  Spoke with patient's daughter [name] to update her.  Follow Up Plan:  No further follow up planned at this time. The patient has been provided with needed resources.  Daveena Elmore [facility]  [facility] Population Health Community Resource Care Guide   [email or web address]  ?? [phone]   Website: [email or web address]  [email or web address]

## 2023-01-25 NOTE — Patient Outreach (Addendum)
Care Coordination   Follow Up Visit Note   01/18/2023 Name: GELENA KLOSINSKI MRN: [id number] DOB: 1933-03-29  ANNIE SAEPHAN is a 89 y.o. year old female who sees Assunta Found, MD for primary care. I spoke with  Daleen Bo by phone today.  What matters to the patients health and wellness today?  Managing lower leg wounds    Goals Addressed             This Visit's Progress    Care Coordination Services-Meals       Care Coordination Goals: Patient will talk with Care Guides regarding Mom's Meals program Patient will reach out to RN Care Coordinator [phone] with any care coordination or resource needs       Manage Blood Pressure       Care Coordination Goals: Patient will take medications as directed and report any negative side effects to provider  Patient will use a pill box/organizer to help keep up with when to take medications Patient will monitor and record blood pressure daily and as needed and will call PCP or specialist with any readings outside of recommended range Patient will keep all recommended follow-up appointments with PCP and specialists (cardiology, nephrology, etc) Patient will take blood pressure log to PCP and specialty appointments for review Patient will follow a low sodium/DASH diet  Patient will reach out to RN Care Coordinator [phone] with any care coordination or resource needs       Wound Improvement       Care Coordination Goals: Patient will keep all medical appointments Patient will work with [facility] for wound care Patient will notify provider of any new or worsening symptoms Patient will continue to clean and dress wounds as instructed Patient will continue antibiotics and other medications as directed Patient will continue to see podiatrist for nail trimming routinely Patient will keep feet propped up when sitting Patient will exercises legs throughout the day Patient will reach out to RN Care  Coordinator [phone] with any resource or care coordination needs        SDOH assessments and interventions completed:  Yes  SDOH Interventions Today    Flowsheet Row Most Recent Value  SDOH Interventions   Transportation Interventions Intervention Not Indicated  Financial Strain Interventions Intervention Not Indicated        Care Coordination Interventions:  Yes, provided  Interventions Today    Flowsheet Row Most Recent Value  Chronic Disease   Chronic disease during today's visit Other, Hypertension (HTN)  [lower extremity wounds]  General Interventions   General Interventions Discussed/Reviewed General Interventions Discussed, General Interventions Reviewed, Doctor Visits, Labs, Horticulturist, commercial (DME), Annual Foot Exam, Community Resources  Doctor Visits Discussed/Reviewed Doctor Visits Discussed, Specialist, Doctor Visits Reviewed, PCP  Horticulturist, commercial (DME) BP Cuff, Wheelchair  PCP/Specialist Visits Compliance with follow-up visit  Communication with --  Lavinia Sharps guide re: Mom's Meals. Patient is interested in pursuing referral]  Exercise Interventions   Exercise Discussed/Reviewed Physical Activity  Physical Activity Discussed/Reviewed Physical Activity Discussed, Physical Activity Reviewed  Education Interventions   Education Provided Provided Education  Provided Verbal Education On Community Resources, When to see the doctor, Mental Health/Coping with Illness, Nutrition, Foot Care, Medication, Other  [wound care, Mom's Meals]  Nutrition Interventions   Nutrition Discussed/Reviewed Nutrition Discussed, Nutrition Reviewed  Pharmacy Interventions   Pharmacy Dicussed/Reviewed Pharmacy Topics Discussed, Pharmacy Topics Reviewed, Medications and their functions  Safety Interventions   Safety Discussed/Reviewed Safety Discussed, Safety Reviewed, Fall Risk, Home  Safety  Home Safety Assistive Devices       Follow up plan: Follow up call scheduled  for 01/28/23    Encounter Outcome:  Pt. Visit Completed   Demetrios Loll, BSN, RN-BC RN Care Coordinator [facility]  [facility] Direct Dial: [phone] Main #: [phone]

## 2023-01-28 ENCOUNTER — Ambulatory Visit: Payer: Self-pay | Admitting: *Deleted

## 2023-01-28 ENCOUNTER — Encounter: Payer: Self-pay | Admitting: *Deleted

## 2023-02-24 DIAGNOSIS — Z993 Dependence on wheelchair: Secondary | ICD-10-CM | POA: Diagnosis not present

## 2023-02-24 DIAGNOSIS — L89893 Pressure ulcer of other site, stage 3: Secondary | ICD-10-CM | POA: Diagnosis not present

## 2023-02-24 DIAGNOSIS — R600 Localized edema: Secondary | ICD-10-CM | POA: Diagnosis not present

## 2023-02-24 DIAGNOSIS — M2041 Other hammer toe(s) (acquired), right foot: Secondary | ICD-10-CM | POA: Diagnosis not present

## 2023-02-24 DIAGNOSIS — M2042 Other hammer toe(s) (acquired), left foot: Secondary | ICD-10-CM | POA: Diagnosis not present

## 2023-02-24 DIAGNOSIS — E1159 Type 2 diabetes mellitus with other circulatory complications: Secondary | ICD-10-CM | POA: Diagnosis not present

## 2023-02-25 NOTE — Patient Outreach (Signed)
Care Coordination   Follow Up Visit Note   01/28/2023 Name: [name] MRN: [id number] DOB: Apr 09, 1933  [name] is a 89 y.o. year old female who sees Assunta Found, MD for primary care. I spoke with  Daleen Bo by phone today.  What matters to the patients health and wellness today?  Managing blood pressure, lower extremity edema, and wounds    Goals Addressed             This Visit's Progress    COMPLETED: Care Coordination Services-Meals       Care Coordination Goals: Patient will talk with Care Guides regarding Mom's Meals program Patient will reach out to RN Care Coordinator [phone] with any care coordination or resource needs       Manage Blood Pressure       Care Coordination Goals: Patient will take medications as directed and report any negative side effects to provider  Patient will use a pill box/organizer to help keep up with when to take medications Patient will monitor and record blood pressure daily and as needed and will call PCP or specialist with any readings outside of recommended range Patient will keep all recommended follow-up appointments with PCP and specialists (cardiology, nephrology, etc) Patient will take blood pressure log to PCP and specialty appointments for review Patient will follow a low sodium/DASH diet  Patient will reach out to RN Care Coordinator [phone] with any care coordination or resource needs      Wound Improvement       Care Coordination Goals: Patient will keep all medical appointments Patient will take  medications as prescribed Patient will work with [facility] for wound care Patient will notify provider of any new or worsening symptoms Patient will continue to clean and dress wounds as instructed Patient will continue antibiotics and other medications as directed Patient will continue to see podiatrist for nail trimming routinely Patient will keep feet propped up when  sitting Patient will exercises legs throughout the day Patient will reach out to RN Care Coordinator [phone] with any resource or care coordination needs        SDOH assessments and interventions completed:  Yes  SDOH Interventions Today    Flowsheet Row Most Recent Value  SDOH Interventions   Transportation Interventions Intervention Not Indicated  Financial Strain Interventions Intervention Not Indicated        Care Coordination Interventions:  Yes, provided  Interventions Today    Flowsheet Row Most Recent Value  Chronic Disease   Chronic disease during today's visit Hypertension (HTN)  General Interventions   General Interventions Discussed/Reviewed General Interventions Discussed, General Interventions Reviewed, Labs, Doctor Visits, Durable Medical Equipment (DME)  Doctor Visits Discussed/Reviewed Doctor Visits Reviewed, Doctor Visits Discussed, Specialist, PCP  Durable Medical Equipment (DME) BP Cuff  PCP/Specialist Visits Compliance with follow-up visit  Exercise Interventions   Exercise Discussed/Reviewed Physical Activity  Physical Activity Discussed/Reviewed Physical Activity Discussed, Physical Activity Reviewed  Education Interventions   Education Provided Provided Education  Provided Verbal Education On When to see the doctor, Mental Health/Coping with Illness, Nutrition, Medication, Exercise  [blood pressure monitoring]  Nutrition Interventions   Nutrition Discussed/Reviewed Nutrition Discussed, Nutrition Reviewed  Pharmacy Interventions   Pharmacy Dicussed/Reviewed Pharmacy Topics Discussed, Pharmacy Topics Reviewed, Medications and their functions  Safety Interventions   Safety Discussed/Reviewed Safety Discussed, Safety Reviewed, Fall Risk, Home Safety  Home Safety Assistive Devices       Follow up plan: Follow up call scheduled for 03/08/23  Encounter Outcome:  Pt. Visit Completed   Demetrios Loll, BSN, RN-BC RN Care Coordinator [facility]   [facility] Direct Dial: [phone] Main #: [phone]

## 2023-03-01 ENCOUNTER — Inpatient Hospital Stay (HOSPITAL_COMMUNITY): Payer: 59

## 2023-03-01 ENCOUNTER — Inpatient Hospital Stay (HOSPITAL_COMMUNITY)
Admission: EM | Admit: 2023-03-01 | Discharge: 2023-03-09 | DRG: 540 | Disposition: A | Discharge status: Skilled Nursing Facility | Payer: 59 | Attending: Internal Medicine | Admitting: Internal Medicine

## 2023-03-01 ENCOUNTER — Encounter (HOSPITAL_COMMUNITY): Payer: Self-pay

## 2023-03-01 ENCOUNTER — Emergency Department (HOSPITAL_COMMUNITY): Payer: 59

## 2023-03-01 DIAGNOSIS — E876 Hypokalemia: Secondary | ICD-10-CM | POA: Diagnosis not present

## 2023-03-01 DIAGNOSIS — Z87891 Personal history of nicotine dependence: Secondary | ICD-10-CM | POA: Diagnosis not present

## 2023-03-01 DIAGNOSIS — L0390 Cellulitis, unspecified: Secondary | ICD-10-CM | POA: Diagnosis not present

## 2023-03-01 DIAGNOSIS — I739 Peripheral vascular disease, unspecified: Secondary | ICD-10-CM | POA: Diagnosis not present

## 2023-03-01 DIAGNOSIS — E039 Hypothyroidism, unspecified: Secondary | ICD-10-CM | POA: Diagnosis not present

## 2023-03-01 DIAGNOSIS — M869 Osteomyelitis, unspecified: Principal | ICD-10-CM | POA: Diagnosis present

## 2023-03-01 DIAGNOSIS — F329 Major depressive disorder, single episode, unspecified: Secondary | ICD-10-CM | POA: Diagnosis present

## 2023-03-01 DIAGNOSIS — F32A Depression, unspecified: Secondary | ICD-10-CM | POA: Diagnosis present

## 2023-03-01 DIAGNOSIS — R32 Unspecified urinary incontinence: Secondary | ICD-10-CM | POA: Diagnosis present

## 2023-03-01 DIAGNOSIS — R7989 Other specified abnormal findings of blood chemistry: Secondary | ICD-10-CM | POA: Diagnosis not present

## 2023-03-01 DIAGNOSIS — R531 Weakness: Secondary | ICD-10-CM | POA: Diagnosis not present

## 2023-03-01 DIAGNOSIS — Z79890 Hormone replacement therapy (postmenopausal): Secondary | ICD-10-CM | POA: Diagnosis not present

## 2023-03-01 DIAGNOSIS — F419 Anxiety disorder, unspecified: Secondary | ICD-10-CM | POA: Diagnosis present

## 2023-03-01 DIAGNOSIS — E11621 Type 2 diabetes mellitus with foot ulcer: Secondary | ICD-10-CM | POA: Diagnosis not present

## 2023-03-01 DIAGNOSIS — R700 Elevated erythrocyte sedimentation rate: Secondary | ICD-10-CM | POA: Diagnosis present

## 2023-03-01 DIAGNOSIS — D638 Anemia in other chronic diseases classified elsewhere: Secondary | ICD-10-CM | POA: Diagnosis not present

## 2023-03-01 DIAGNOSIS — E890 Postprocedural hypothyroidism: Secondary | ICD-10-CM | POA: Diagnosis not present

## 2023-03-01 DIAGNOSIS — Z952 Presence of prosthetic heart valve: Secondary | ICD-10-CM

## 2023-03-01 DIAGNOSIS — M24562 Contracture, left knee: Secondary | ICD-10-CM | POA: Diagnosis not present

## 2023-03-01 DIAGNOSIS — Z9049 Acquired absence of other specified parts of digestive tract: Secondary | ICD-10-CM

## 2023-03-01 DIAGNOSIS — R7982 Elevated C-reactive protein (CRP): Secondary | ICD-10-CM | POA: Diagnosis present

## 2023-03-01 DIAGNOSIS — E785 Hyperlipidemia, unspecified: Secondary | ICD-10-CM | POA: Diagnosis present

## 2023-03-01 DIAGNOSIS — M24561 Contracture, right knee: Secondary | ICD-10-CM | POA: Diagnosis present

## 2023-03-01 DIAGNOSIS — J449 Chronic obstructive pulmonary disease, unspecified: Secondary | ICD-10-CM | POA: Diagnosis present

## 2023-03-01 DIAGNOSIS — M6281 Muscle weakness (generalized): Secondary | ICD-10-CM | POA: Diagnosis not present

## 2023-03-01 DIAGNOSIS — L03115 Cellulitis of right lower limb: Secondary | ICD-10-CM | POA: Diagnosis present

## 2023-03-01 DIAGNOSIS — L97519 Non-pressure chronic ulcer of other part of right foot with unspecified severity: Secondary | ICD-10-CM | POA: Diagnosis not present

## 2023-03-01 DIAGNOSIS — Z96653 Presence of artificial knee joint, bilateral: Secondary | ICD-10-CM | POA: Diagnosis present

## 2023-03-01 DIAGNOSIS — Z8709 Personal history of other diseases of the respiratory system: Secondary | ICD-10-CM | POA: Diagnosis not present

## 2023-03-01 DIAGNOSIS — R6889 Other general symptoms and signs: Secondary | ICD-10-CM | POA: Diagnosis not present

## 2023-03-01 DIAGNOSIS — N178 Other acute kidney failure: Secondary | ICD-10-CM | POA: Diagnosis not present

## 2023-03-01 DIAGNOSIS — M79671 Pain in right foot: Secondary | ICD-10-CM | POA: Diagnosis present

## 2023-03-01 DIAGNOSIS — R2689 Other abnormalities of gait and mobility: Secondary | ICD-10-CM | POA: Diagnosis not present

## 2023-03-01 DIAGNOSIS — Z96642 Presence of left artificial hip joint: Secondary | ICD-10-CM | POA: Diagnosis not present

## 2023-03-01 DIAGNOSIS — M868X7 Other osteomyelitis, ankle and foot: Secondary | ICD-10-CM | POA: Diagnosis not present

## 2023-03-01 DIAGNOSIS — E860 Dehydration: Secondary | ICD-10-CM | POA: Diagnosis present

## 2023-03-01 DIAGNOSIS — M7989 Other specified soft tissue disorders: Secondary | ICD-10-CM | POA: Diagnosis not present

## 2023-03-01 DIAGNOSIS — N19 Unspecified kidney failure: Secondary | ICD-10-CM | POA: Diagnosis not present

## 2023-03-01 DIAGNOSIS — Z79899 Other long term (current) drug therapy: Secondary | ICD-10-CM

## 2023-03-01 DIAGNOSIS — I10 Essential (primary) hypertension: Secondary | ICD-10-CM | POA: Diagnosis present

## 2023-03-01 DIAGNOSIS — F0393 Unspecified dementia, unspecified severity, with mood disturbance: Secondary | ICD-10-CM | POA: Diagnosis present

## 2023-03-01 DIAGNOSIS — I70261 Atherosclerosis of native arteries of extremities with gangrene, right leg: Secondary | ICD-10-CM | POA: Diagnosis not present

## 2023-03-01 DIAGNOSIS — M86171 Other acute osteomyelitis, right ankle and foot: Secondary | ICD-10-CM | POA: Diagnosis not present

## 2023-03-01 DIAGNOSIS — Z9851 Tubal ligation status: Secondary | ICD-10-CM

## 2023-03-01 DIAGNOSIS — R600 Localized edema: Secondary | ICD-10-CM | POA: Diagnosis not present

## 2023-03-01 DIAGNOSIS — Z7401 Bed confinement status: Secondary | ICD-10-CM | POA: Diagnosis not present

## 2023-03-01 DIAGNOSIS — Z743 Need for continuous supervision: Secondary | ICD-10-CM | POA: Diagnosis not present

## 2023-03-01 DIAGNOSIS — M1990 Unspecified osteoarthritis, unspecified site: Secondary | ICD-10-CM | POA: Diagnosis present

## 2023-03-01 DIAGNOSIS — I2510 Atherosclerotic heart disease of native coronary artery without angina pectoris: Secondary | ICD-10-CM | POA: Diagnosis not present

## 2023-03-01 DIAGNOSIS — R609 Edema, unspecified: Secondary | ICD-10-CM | POA: Diagnosis present

## 2023-03-01 LAB — CBC WITH DIFFERENTIAL/PLATELET
Abs Immature Granulocytes: 0.04 10*3/uL (ref 0.00–0.07)
Basophils Absolute: 0.1 10*3/uL (ref 0.0–0.1)
Basophils Relative: 1 %
Eosinophils Absolute: 0.2 10*3/uL (ref 0.0–0.5)
Eosinophils Relative: 3 %
HCT: 35.8 % — ABNORMAL LOW (ref 36.0–46.0)
Hemoglobin: 11 g/dL — ABNORMAL LOW (ref 12.0–15.0)
Immature Granulocytes: 1 %
Lymphocytes Relative: 20 %
Lymphs Abs: 1.5 10*3/uL (ref 0.7–4.0)
MCH: 27.6 pg (ref 26.0–34.0)
MCHC: 30.7 g/dL (ref 30.0–36.0)
MCV: 89.7 fL (ref 80.0–100.0)
Monocytes Absolute: 0.6 10*3/uL (ref 0.1–1.0)
Monocytes Relative: 8 %
Neutro Abs: 5 10*3/uL (ref 1.7–7.7)
Neutrophils Relative %: 67 %
Platelets: 240 10*3/uL (ref 150–400)
RBC: 3.99 MIL/uL (ref 3.87–5.11)
RDW: 15.2 % (ref 11.5–15.5)
WBC: 7.4 10*3/uL (ref 4.0–10.5)
nRBC: 0 % (ref 0.0–0.2)

## 2023-03-01 LAB — COMPREHENSIVE METABOLIC PANEL
ALT: 19 U/L (ref 0–44)
AST: 30 U/L (ref 15–41)
Albumin: 2.8 g/dL — ABNORMAL LOW (ref 3.5–5.0)
Alkaline Phosphatase: 90 U/L (ref 38–126)
Anion gap: 11 (ref 5–15)
BUN: 24 mg/dL — ABNORMAL HIGH (ref 8–23)
CO2: 29 mmol/L (ref 22–32)
Calcium: 8.4 mg/dL — ABNORMAL LOW (ref 8.9–10.3)
Chloride: 100 mmol/L (ref 98–111)
Creatinine, Ser: 1.05 mg/dL — ABNORMAL HIGH (ref 0.44–1.00)
GFR, Estimated: 51 mL/min — ABNORMAL LOW (ref >60)
Glucose, Bld: 96 mg/dL (ref 70–99)
Potassium: 2.9 mmol/L — ABNORMAL LOW (ref 3.5–5.1)
Sodium: 140 mmol/L (ref 135–145)
Total Bilirubin: 0.6 mg/dL (ref 0.3–1.2)
Total Protein: 5.7 g/dL — ABNORMAL LOW (ref 6.5–8.1)

## 2023-03-01 LAB — URINALYSIS, ROUTINE W REFLEX MICROSCOPIC
Bilirubin Urine: NEGATIVE
Glucose, UA: NEGATIVE mg/dL
Hgb urine dipstick: NEGATIVE
Ketones, ur: NEGATIVE mg/dL
Leukocytes,Ua: NEGATIVE
Nitrite: NEGATIVE
Protein, ur: NEGATIVE mg/dL
Specific Gravity, Urine: 1.008 (ref 1.005–1.030)
pH: 7 (ref 5.0–8.0)

## 2023-03-01 LAB — SEDIMENTATION RATE: Sed Rate: 33 mm/hr — ABNORMAL HIGH (ref 0–22)

## 2023-03-01 LAB — C-REACTIVE PROTEIN: CRP: 2.9 mg/dL — ABNORMAL HIGH (ref <1.0)

## 2023-03-01 LAB — MAGNESIUM: Magnesium: 2 mg/dL (ref 1.7–2.4)

## 2023-03-01 MED ORDER — POTASSIUM CHLORIDE 10 MEQ/100ML IV SOLN
10.0000 meq | INTRAVENOUS | Status: AC
  Administered 2023-03-01 (×3): 10 meq via INTRAVENOUS
  Filled 2023-03-01 (×3): qty 100

## 2023-03-01 MED ORDER — MELATONIN 3 MG PO TABS
3.0000 mg | ORAL_TABLET | Freq: Every evening | ORAL | Status: DC | PRN
  Administered 2023-03-04: 3 mg via ORAL
  Filled 2023-03-01: qty 1

## 2023-03-01 MED ORDER — VANCOMYCIN HCL IN DEXTROSE 1-5 GM/200ML-% IV SOLN
1000.0000 mg | INTRAVENOUS | Status: DC
  Administered 2023-03-02 – 2023-03-05 (×4): 1000 mg via INTRAVENOUS
  Filled 2023-03-01 (×4): qty 200

## 2023-03-01 MED ORDER — OXYCODONE-ACETAMINOPHEN 5-325 MG PO TABS
1.0000 | ORAL_TABLET | Freq: Once | ORAL | Status: AC
  Administered 2023-03-01: 1 via ORAL
  Filled 2023-03-01: qty 1

## 2023-03-01 MED ORDER — LACTATED RINGERS IV SOLN: INTRAVENOUS | Status: AC

## 2023-03-01 MED ORDER — GADOBUTROL 1 MMOL/ML IV SOLN
8.0000 mL | Freq: Once | INTRAVENOUS | Status: AC | PRN
  Administered 2023-03-01: 8 mL via INTRAVENOUS

## 2023-03-01 MED ORDER — ACETAMINOPHEN 325 MG PO TABS
650.0000 mg | ORAL_TABLET | Freq: Four times a day (QID) | ORAL | Status: DC | PRN
  Administered 2023-03-04 – 2023-03-09 (×7): 650 mg via ORAL
  Filled 2023-03-01 (×7): qty 2

## 2023-03-01 MED ORDER — NALOXONE HCL 0.4 MG/ML IJ SOLN: 0.4000 mg | INTRAMUSCULAR | Status: DC | PRN

## 2023-03-01 MED ORDER — ONDANSETRON HCL 4 MG/2ML IJ SOLN: 4.0000 mg | Freq: Four times a day (QID) | INTRAMUSCULAR | Status: DC | PRN

## 2023-03-01 MED ORDER — PIPERACILLIN-TAZOBACTAM 3.375 G IVPB
3.3750 g | Freq: Three times a day (TID) | INTRAVENOUS | Status: DC
  Administered 2023-03-02 – 2023-03-06 (×13): 3.375 g via INTRAVENOUS
  Filled 2023-03-01 (×13): qty 50

## 2023-03-01 MED ORDER — LACTATED RINGERS IV BOLUS
500.0000 mL | Freq: Once | INTRAVENOUS | Status: AC
  Administered 2023-03-01: 500 mL via INTRAVENOUS

## 2023-03-01 MED ORDER — ALBUTEROL SULFATE (2.5 MG/3ML) 0.083% IN NEBU: 2.5000 mg | INHALATION_SOLUTION | RESPIRATORY_TRACT | Status: DC | PRN

## 2023-03-01 MED ORDER — VANCOMYCIN HCL 1750 MG/350ML IV SOLN
1750.0000 mg | Freq: Once | INTRAVENOUS | Status: AC
  Administered 2023-03-01: 1750 mg via INTRAVENOUS
  Filled 2023-03-01: qty 350

## 2023-03-01 MED ORDER — MORPHINE SULFATE (PF) 4 MG/ML IV SOLN
4.0000 mg | INTRAVENOUS | Status: DC | PRN
  Administered 2023-03-01 – 2023-03-09 (×14): 4 mg via INTRAVENOUS
  Filled 2023-03-01 (×15): qty 1

## 2023-03-01 MED ORDER — ACETAMINOPHEN 650 MG RE SUPP
650.0000 mg | Freq: Four times a day (QID) | RECTAL | Status: DC | PRN
  Filled 2023-03-01: qty 1

## 2023-03-01 MED ORDER — PIPERACILLIN-TAZOBACTAM 3.375 G IVPB 30 MIN
3.3750 g | Freq: Once | INTRAVENOUS | Status: AC
  Administered 2023-03-01: 3.375 g via INTRAVENOUS
  Filled 2023-03-01: qty 50

## 2023-03-01 MED ORDER — POTASSIUM CHLORIDE CRYS ER 20 MEQ PO TBCR
40.0000 meq | EXTENDED_RELEASE_TABLET | Freq: Once | ORAL | Status: AC
  Administered 2023-03-01: 40 meq via ORAL
  Filled 2023-03-01: qty 2

## 2023-03-01 NOTE — Progress Notes (Signed)
Pharmacy Antibiotic Note  [name] is a 89 y.o. female admitted on 03/01/2023 with  concern for osteomyelitis .  Pharmacy has been consulted for zosyn and vancomycin dosing. Baseline Scr ~ 1.00.   Plan: Zosyn 3.375mg  Q8H.  Give IV Vancomycin 1750mg  x 1 for loading dose, followed by IV Vancomycin 1000 every 24 hours for eAUC458.  Follow culture data for de-escalation.  Monitor renal function for dose adjustments as indicated.    Weight: 83 kg (182 lb 15.7 oz)  Temp (24hrs), Avg:97.7 F (36.5 C), Min:97.6 F (36.4 C), Max:97.8 F (36.6 C)  Recent Labs  Lab 03/01/23 1357  WBC 7.4  CREATININE 1.05*    Estimated Creatinine Clearance: 42.6 mL/min (A) (by C-G formula based on SCr of 1.05 mg/dL (H)).    No Active Allergies  Thank you for allowing pharmacy to be a part of this patient's care.  Estill Batten, PharmD, BCCCP  03/01/2023 9:03 PM

## 2023-03-01 NOTE — H&P (Signed)
History and Physical      ASHLAND HUTH BJY:[id number] DOB: 08/24/1933 DOA: 03/01/2023; DOS: 03/01/2023  PCP: Assunta Found, MD  Patient coming from: home   I have personally briefly reviewed patient's old medical records in [facility]  Chief Complaint: Right foot pain  HPI: [name] is a 89 y.o. female with medical history significant for COPD, acquired hypothyroidism, severe aortic stenosis status post TAVR in August 2023, anemia of chronic disease associated baseline hemoglobin range 10-12, who is admitted to [facility] on 03/01/2023 with osteomyelitis of the right foot after presenting from home to [facility] ED complaining of right foot pain.   The patient reports a chronic ulcer on the dorsal surface of the right foot.  However, over the course of the last week, she notes worsening pain over the dorsum of the right foot associated with increase in erythema, swelling.  No significant drainage from the site, including no purulent drainage.  No associated any new numbness or paresthesias.  Denies any acute lower extremity weakness.  Aside from the chronic ulcer on the dorsal surface of the right foot, denies any recent trauma to the right foot.  Denies rash to any other site.  Denies any associated subjective fever, chills, rigors, or generalized myalgias.  Denies any known history of underlying diabetes.  No recent chest pain, shortness of breath, orthopnea, PND, palpitations, diaphoresis, dizziness, presyncope, or syncope.  Conveys that she is not on blood thinners as an outpatient, including no aspirin.    ED Course:  Vital signs in the ED were notable for the following: Afebrile; heart rate 83-94; systolic blood pressures in the 150s and 160s; respiratory rate 16-18, oxygen saturation 99 to 100% on room air.  Labs were notable for the following: CMP notable for the following: Sodium 140, potassium 2.9, bicarbonate 29, creatinine 1.05 unchanged from most recent prior  serum creatinine data point on 01/08/2023, BUN/creatinine ratio greater than 20, calcium, just from out of anemia noted to be 9.3, evident 2.8, liver enzymes within normal limits.  Serum magnesium level 2.0.  CRP 2.9, while ESR is currently pending.  CBC notable for episode count 3400, hemoglobin 11.0 compared to most recent prior value of 10.1 on 01/07/2023, associated normocytic/recurrent properties as well as nonelevated RDW and platelet count 240.  Urinalysis showed no white blood cells, was nitrate/leukocyte esterase negative.  Blood cultures x 2 were collected prior to initiation of IV antibiotics.  Imaging and additional notable ED work-up: Plain film of the right foot showed interval development of cortical disruption and resorption about the head of the third toe proximal phalanx along with diffuse soft tissue swelling of the foot and a soft tissue ulcer about the dorsum of the foot.  MRI of the right foot has been ordered, with result currently pending.  EDP is discussed with on-call orthopedic surgery, Dr. [facility], who conveyed the orthopedic surgery will formally consult and see the patient in the morning (6/5), requesting that the patient be made n.p.o. after midnight.  While in the ED, the following were administered: Percocet 5/325 mg p.o. x 1 dose, potassium chloride 40 mill colons p.o. x 1 dose, potassium chloride 30 mill equivalents IV over 3 hours, lactated Ringer's at 500 cc bolus, Zosyn, IV vancomycin.  Subsequently, the patient was admitted for further evaluation and management of presenting extremities the right foot, with clinical evidence to suggest dehydration, and presenting labs notable for hypokalemia.     Review of Systems: As  per HPI otherwise 10 point review of systems negative.   Past Medical History:  Diagnosis Date   Anxiety    Arthritis    CAD (coronary artery disease)    Mild at cardiac catheterization 2018   Chronic bronchitis    Constipation     Depression    Hyperlipidemia    Hypokalemia 02/12/2017   Hypothyroidism    S/P TAVR (transcatheter aortic valve replacement) 05/25/2022   26mm S3UR via TF approach with Dr. Clifton James and Dr. Laneta Simmers   Severe aortic stenosis    Wound of left leg 01/07/2023    Past Surgical History:  Procedure Laterality Date   BUNIONECTOMY Left 1975   CARDIAC CATHETERIZATION  07/04/2020   CHOLECYSTECTOMY N/A 02/15/2017   Procedure: LAPAROSCOPIC CHOLECYSTECTOMY;  Surgeon: Axel Filler, MD;  Location: [facility] OR;  Service: General;  Laterality: N/A;   CORONARY ANGIOGRAPHY N/A 02/14/2017   Procedure: Coronary Angiography;  Surgeon: Yvonne Kendall, MD;  Location: [facility] INVASIVE CV LAB;  Service: Cardiovascular;  Laterality: N/A;   EYE SURGERY  2009   bilateral   HEMATOMA EVACUATION  06/04/2011   Procedure: EVACUATION HEMATOMA;  Surgeon: Fuller Canada, MD;  Location: [facility] ORS;  Service: Orthopedics;  Laterality: Right;  evacuation of seroma   INTRAOPERATIVE TRANSTHORACIC ECHOCARDIOGRAM N/A 05/25/2022   Procedure: INTRAOPERATIVE TRANSTHORACIC ECHOCARDIOGRAM;  Surgeon: Kathleene Hazel, MD;  Location: [facility] INVASIVE CV LAB;  Service: Open Heart Surgery;  Laterality: N/A;   JOINT REPLACEMENT  05/2010   left total knee Dr. Romeo Apple   PATELLAR TENDON REPAIR  06/04/2011   Procedure: PATELLA TENDON REPAIR;  Surgeon: Fuller Canada, MD;  Location: [facility] ORS;  Service: Orthopedics;  Laterality: Right;   PATELLAR TENDON REPAIR  08/30/2011   Procedure: PATELLA TENDON REPAIR;  Surgeon: Fuller Canada, MD;  Location: [facility] ORS;  Service: Orthopedics;  Laterality: Right;  Allograft Reconstrucation Right Patella Tendon   RIGHT/LEFT HEART CATH AND CORONARY ANGIOGRAPHY N/A 07/04/2020   Procedure: RIGHT/LEFT HEART CATH AND CORONARY ANGIOGRAPHY;  Surgeon: Kathleene Hazel, MD;  Location: [facility] INVASIVE CV LAB;  Service: Cardiovascular;  Laterality: N/A;   TOTAL HIP ARTHROPLASTY Left 1998   TOTAL KNEE ARTHROPLASTY   05/03/2011   Procedure: TOTAL KNEE ARTHROPLASTY;  Surgeon: Fuller Canada, MD;  Location: [facility] ORS;  Service: Orthopedics;  Laterality: Right;  With DePuy   TOTAL THYROIDECTOMY  11/2009   Dr. Suszanne Conners @ [facility]   TRANSCATHETER AORTIC VALVE REPLACEMENT, TRANSFEMORAL Right 05/25/2022   Procedure: Transcatheter Aortic Valve Replacement, Transfemoral;  Surgeon: Kathleene Hazel, MD;  Location: [facility] INVASIVE CV LAB;  Service: Open Heart Surgery;  Laterality: Right;   TUBAL LIGATION      Social History:  reports that she quit smoking about 28 years ago. Her smoking use included cigarettes. She has never used smokeless tobacco. She reports that she does not drink alcohol and does not use drugs.   No Active Allergies  Family History  Problem Relation Age of Onset   Colon cancer Mother    Blindness Sister    Anesthesia problems Neg Hx    Hypotension Neg Hx    Malignant hyperthermia Neg Hx    Pseudochol deficiency Neg Hx     Family history reviewed and not pertinent    Prior to Admission medications   Medication Sig Start Date End Date Taking? Authorizing Provider  acetaminophen (TYLENOL) 325 MG tablet Take 2 tablets (650 mg total) by mouth every 6 (six) hours as needed for mild pain (or Fever >/= 101). 01/08/23  Yes  Erick Alley, DO  amoxicillin (AMOXIL) 875 MG tablet Take 875 mg by mouth 2 (two) times daily. 02/24/23  Yes [provider]  furosemide (LASIX) 40 MG tablet Take 1.5 tablets (60 mg total) by mouth daily. Patient taking differently: Take 40 mg by mouth daily. 06/30/22  Yes Janetta Hora, PA-C  ibuprofen (ADVIL) 200 MG tablet Take 600 mg by mouth every 6 (six) hours as needed for fever, mild pain or moderate pain.   Yes [provider]  levothyroxine (SYNTHROID) 150 MCG tablet Take 150 mcg by mouth daily before breakfast.   Yes [provider]  oxybutynin (DITROPAN XL) 15 MG 24 hr tablet Take 15 mg by mouth daily.   Yes [provider]   PARoxetine (PAXIL) 20 MG tablet Take 20 mg by mouth daily.   Yes [provider]  nitroGLYCERIN (NITROSTAT) 0.4 MG SL tablet Place 1 tablet (0.4 mg total) under the tongue every 5 (five) minutes as needed for chest pain. 03/09/22   Strader, Lennart Pall, PA-C  potassium chloride SA (KLOR-CON M) 20 MEQ tablet Take 1.5 tablets (30 mEq total) by mouth daily. 07/01/22   Janetta Hora, PA-C     Objective    Physical Exam: Vitals:   03/01/23 1821 03/01/23 1915 03/01/23 2100 03/01/23 2130  BP: (!) 169/65 (!) 158/72  (!) 153/94  Pulse: 83 94  96  Resp: 18   19  Temp: 97.6 F (36.4 C)     TempSrc: Oral     SpO2: 98% 93%  94%  Weight:   83 kg     General: appears to be stated age; alert, oriented Skin: warm, dry, ulcer associate with the dorsal aspect of the right foot, surrounding erythema, swelling Head:  AT/[place] Mouth:  Oral mucosa membranes appear dry, normal dentition Neck: supple; trachea midline Heart:  RRR; did not appreciate any M/R/G Lungs: CTAB, did not appreciate any wheezes, rales, or rhonchi Abdomen: + BS; soft, ND, NT Vascular: 2+ pedal pulses b/l; 2+ radial pulses b/l Extremities:  no muscle wasting; ulcer associate with the dorsal aspect of the right foot, surrounding erythema, swelling Neuro: strength and sensation intact in upper and lower extremities b/l    Labs on Admission: I have personally reviewed following labs and imaging studies  CBC: Recent Labs  Lab 03/01/23 1357  WBC 7.4  NEUTROABS 5.0  HGB 11.0*  HCT 35.8*  MCV 89.7  PLT 240   Basic Metabolic Panel: Recent Labs  Lab 03/01/23 1357 03/01/23 1943  NA 140  --   K 2.9*  --   CL 100  --   CO2 29  --   GLUCOSE 96  --   BUN 24*  --   CREATININE 1.05*  --   CALCIUM 8.4*  --   MG  --  2.0   GFR: Estimated Creatinine Clearance: 42.6 mL/min (A) (by C-G formula based on SCr of 1.05 mg/dL (H)). Liver Function Tests: Recent Labs  Lab 03/01/23 1357  AST 30  ALT 19  ALKPHOS 90   BILITOT 0.6  PROT 5.7*  ALBUMIN 2.8*   No results for input(s): "LIPASE", "AMYLASE" in the last 168 hours. No results for input(s): "AMMONIA" in the last 168 hours. Coagulation Profile: No results for input(s): "INR", "PROTIME" in the last 168 hours. Cardiac Enzymes: No results for input(s): "CKTOTAL", "CKMB", "CKMBINDEX", "TROPONINI" in the last 168 hours. BNP (last 3 results) No results for input(s): "PROBNP" in the last 8760 hours. HbA1C: No results  for input(s): "HGBA1C" in the last 72 hours. CBG: No results for input(s): "GLUCAP" in the last 168 hours. Lipid Profile: No results for input(s): "CHOL", "HDL", "LDLCALC", "TRIG", "CHOLHDL", "LDLDIRECT" in the last 72 hours. Thyroid Function Tests: No results for input(s): "TSH", "T4TOTAL", "FREET4", "T3FREE", "THYROIDAB" in the last 72 hours. Anemia Panel: No results for input(s): "VITAMINB12", "FOLATE", "FERRITIN", "TIBC", "IRON", "RETICCTPCT" in the last 72 hours. Urine analysis:    Component Value Date/Time   COLORURINE STRAW (A) 03/01/2023 1349   APPEARANCEUR CLEAR 03/01/2023 1349   LABSPEC 1.008 03/01/2023 1349   PHURINE 7.0 03/01/2023 1349   GLUCOSEU NEGATIVE 03/01/2023 1349   HGBUR NEGATIVE 03/01/2023 1349   BILIRUBINUR NEGATIVE 03/01/2023 1349   KETONESUR NEGATIVE 03/01/2023 1349   PROTEINUR NEGATIVE 03/01/2023 1349   UROBILINOGEN 0.2 02/05/2014 1511   NITRITE NEGATIVE 03/01/2023 1349   LEUKOCYTESUR NEGATIVE 03/01/2023 1349    Radiological Exams on Admission: DG Foot 2 Views Right  Result Date: 03/01/2023 CLINICAL DATA:  Foot wound on bottom of right foot EXAM: RIGHT FOOT - 2 VIEW COMPARISON:  Radiographs 01/06/2023 FINDINGS: Demineralization. There is new cortical disruption and resorption about the head of the third toe proximal phalanx. The third toe mid and distal phalanx are not well visualized. Dislocation of the middle phalanx of the fourth toe in relation to the proximal phalanx. Plantar calcaneal spur.  Vascular calcifications. Diffuse soft tissue swelling about the foot. Soft tissue ulcer about the dorsum of the foot at the MTP joint on lateral view. IMPRESSION: 1. Third toe osteomyelitis. 2. Dislocation of the right fourth toe middle phalanx. 3. Diffuse soft tissue swelling about the foot. Electronically Signed   By: Minerva Fester M.D.   On: 03/01/2023 19:58      Assessment/Plan   Principal Problem:   Osteomyelitis of right lower extremity (HCC) Active Problems:   Acquired hypothyroidism   Depression   Hypokalemia   Dehydration   Acute prerenal azotemia   History of COPD   Urinary incontinence   Anemia of chronic disease     #) Osteomyelitis of the right foot: Diagnosis on the basis of 1 week of progressive erythema, pain, , swelling associated with the dorsal aspect of the right foot, with ensuing plain film of the right foot suggestive of osteomyelitis involving the head of the third toe proximal phalanx, will also noting elevated CRP, with ESR currently pending.  Oral entry appears to be the patient's reported chronic ulcer on the dorsal surface of the right foot.   In the absence of any objective fever or leukocytosis, SIRS criteria not met for sepsis at this time.  She appears hemodynamically stable.  Blood cultures x 2 were collected prior to initiation of IV vancomycin and Zosyn, which will be continued for now.  Not on any blood thinners as an outpatient, including no aspirin. Chales Abrahams Score for this patient in the context of anticipated aforementioned surgery conveys a  1.82% perioperative risk for significant cardiac event. No evidence to suggest acutely decompensated heart failure or acute MI. Consequently, no absolute contraindications to proceeding with proposed surgery at this time.  Will pursue preoperative EKG, INR, and pursue type and screen.   EDP is discussed with on-call orthopedic surgery, Dr. [facility], who conveyed the orthopedic surgery will formally  consult and see the patient in the morning (6/5), requesting that the patient be made n.p.o. after midnight.   Plan: Follow-up results of blood cultures x 2.  Continue IV Vanco and Zosyn.  Follow  result ESR.  Follow-up result of MRI of the right foot.  Orthopedic surgery formally consulted.  Per the request, patient has been made n.p.o. at midnight.  As needed IV morphine.  Type and screen.  Preoperative EKG ordered.  Check INR.              #) Hypokalemia: Presenting serum potassium level found to be 2.9.  This appears to be the context of an element of recurrence of hypokalemia per chart review, for which the patient has at times been on potassium supplementation as an outpatient.  She has received 40 mill colons of oral potassium as well as 30 mill equivalents of IV potassium in the emergency department this evening.  Of note, serum magnesium level within normal limits at 2.0.  Plan: Repeat CMP in the morning.  Monitor on symmetry.                  #) Dehydration: Clinical suspicion for such, including the appearance of dry oral mucous membranes as well as laboratory findings notable for acute prerenal azotemia. Appears to be in the setting of   ostensible losses stemming from her presenting infection, namely the osteomyelitis involving the right foot, along with ulcerative lesion involving the dorsal aspect of the right foot.  No e/o associated hypotension.   Plan: Monitor strict I's and O's.  Daily weights.  CMP in the morning. IVF's in form of lactated Ringer's at 50 cc/h x 8 hours.  Hold outpatient Lasix for now.             #) COPD: Documented history thereof, with documentation of a history of chronic bronchitis, will while noted to be a former smoker, without clinical evidence of acute exacerbation at this time.    Plan: prn albuterol nebulizer. Check CMP and serum magnesium level in the AM.                 #) Depression: documented h/o  such. On Paxil as outpatient.    Plan: In the setting of n.p.o. status starting at midnight, will hold home Paxil for now.              #) acquired hypothyroidism: documented h/o such, on Synthroid as outpatient.   Plan: Hold home Synthroid in the setting of impending n.p.o. status, as above.                #) Anemia of chronic disease: Documented history of such, a/w with baseline hgb range 10-12, with presenting hgb consistent with this range, in the absence of any overt evidence of active bleed.     Plan: Repeat CBC in the morning.  Check INR.  Type and screen ordered in the context of impending orthopedic surgery, as above.      DVT prophylaxis: SCD's   Code Status: Full code Family Communication: none Disposition Plan: Per Rounding Team Consults called: EDP d/w case/imaging w/ on-call ortho (Dr. [facility], who conveyed that orthopedic surgery will formally consult and see the patient in the morning (6/5), as further detailed above;  Admission status: inpatient     I SPENT GREATER THAN 75  MINUTES IN CLINICAL CARE TIME/MEDICAL DECISION-MAKING IN COMPLETING THIS ADMISSION.      Chaney Born Khamil Lamica DO [facility]  From 7PM - 7AM   03/01/2023, 9:45 PM

## 2023-03-01 NOTE — ED Notes (Signed)
Pt at MRI

## 2023-03-01 NOTE — ED Provider Notes (Signed)
[place] EMERGENCY DEPARTMENT AT [facility] Provider Note   CSN: [id number] Arrival date & time: 03/01/23  1328     History  Chief Complaint  Patient presents with   Cellulitis    [name] is a 89 y.o. female.  HPI Patient presents for BLE swelling and wounds.  Medical history includes arthritis, anxiety, CAD, depression, HLD, stenosis s/p TAVR.  She was admitted to the hospital in April for BLE cellulitis.  She was discharged home on Keflex and doxycycline.  She states that she went to her primary care doctor's office on Wednesday.  She had a callus removed from the plantar aspect of first MTP on right foot.  She has since developed a wound there.  She has also noticed some new small wounds on her anterior and posterior left tibial region.  Patient denies any significant pain associated with these wounds.  She has had increased redness and swelling to her bilateral lower extremities.  She has been nonambulatory for the past 12 years.  She lives independently.  She is able to transfer herself to and from wheelchair on her own.  She denies any recent systemic symptoms.    Home Medications Prior to Admission medications   Medication Sig Start Date End Date Taking? Authorizing Provider  acetaminophen (TYLENOL) 325 MG tablet Take 2 tablets (650 mg total) by mouth every 6 (six) hours as needed for mild pain (or Fever >/= 101). 01/08/23   Erick Alley, DO  furosemide (LASIX) 40 MG tablet Take 1.5 tablets (60 mg total) by mouth daily. 06/30/22   Janetta Hora, PA-C  levothyroxine (SYNTHROID) 150 MCG tablet Take 150 mcg by mouth daily before breakfast.    [provider]  nitroGLYCERIN (NITROSTAT) 0.4 MG SL tablet Place 1 tablet (0.4 mg total) under the tongue every 5 (five) minutes as needed for chest pain. 03/09/22   Strader, Lennart Pall, PA-C  oxybutynin (DITROPAN XL) 15 MG 24 hr tablet Take 15 mg by mouth daily.    [provider]  PARoxetine  (PAXIL) 20 MG tablet Take 20 mg by mouth daily.    [provider]  potassium chloride SA (KLOR-CON M) 20 MEQ tablet Take 1.5 tablets (30 mEq total) by mouth daily. 07/01/22   Janetta Hora, PA-C      Allergies    Patient has no active allergies.    Review of Systems   Review of Systems  Cardiovascular:  Positive for leg swelling.  Skin:  Positive for wound.  All other systems reviewed and are negative.   Physical Exam Updated Vital Signs BP (!) 158/72   Pulse 94   Temp 97.6 F (36.4 C) (Oral)   Resp 18   Wt 83 kg   SpO2 93%   BMI 27.02 kg/m  Physical Exam Vitals and nursing note reviewed.  Constitutional:      General: She is not in acute distress.    Appearance: Normal appearance. She is well-developed. She is not toxic-appearing or diaphoretic.  HENT:     Head: Normocephalic and atraumatic.     Right Ear: External ear normal.     Left Ear: External ear normal.     Nose: Nose normal.     Mouth/Throat:     Mouth: Mucous membranes are dry.  Eyes:     Extraocular Movements: Extraocular movements intact.     Conjunctiva/sclera: Conjunctivae normal.  Cardiovascular:     Rate and Rhythm: Normal rate and regular rhythm.  Pulmonary:  Effort: Pulmonary effort is normal. No respiratory distress.  Abdominal:     General: There is no distension.     Palpations: Abdomen is soft.     Tenderness: There is no abdominal tenderness.  Musculoskeletal:        General: No tenderness.     Cervical back: Neck supple.     Right lower leg: Edema present.     Left lower leg: Edema present.  Skin:    General: Skin is warm and dry.     Capillary Refill: Capillary refill takes less than 2 seconds.     Coloration: Skin is not jaundiced or pale.  Neurological:     General: No focal deficit present.     Mental Status: She is alert and oriented to person, place, and time.  Psychiatric:        Mood and Affect: Mood normal.        Behavior: Behavior normal.         ED Results / Procedures / Treatments   Labs (all labs ordered are listed, but only abnormal results are displayed) Labs Reviewed  COMPREHENSIVE METABOLIC PANEL - Abnormal; Notable for the following components:      Result Value   Potassium 2.9 (*)    BUN 24 (*)    Creatinine, Ser 1.05 (*)    Calcium 8.4 (*)    Total Protein 5.7 (*)    Albumin 2.8 (*)    GFR, Estimated 51 (*)    All other components within normal limits  CBC WITH DIFFERENTIAL/PLATELET - Abnormal; Notable for the following components:   Hemoglobin 11.0 (*)    HCT 35.8 (*)    All other components within normal limits  URINALYSIS, ROUTINE W REFLEX MICROSCOPIC - Abnormal; Notable for the following components:   Color, Urine STRAW (*)    All other components within normal limits  C-REACTIVE PROTEIN - Abnormal; Notable for the following components:   CRP 2.9 (*)    All other components within normal limits  CULTURE, BLOOD (ROUTINE X 2)  CULTURE, BLOOD (ROUTINE X 2)  MAGNESIUM  SEDIMENTATION RATE  CBC WITH DIFFERENTIAL/PLATELET  COMPREHENSIVE METABOLIC PANEL  MAGNESIUM  PROTIME-INR    EKG None  Radiology DG Foot 2 Views Right  Result Date: 03/01/2023 CLINICAL DATA:  Foot wound on bottom of right foot EXAM: RIGHT FOOT - 2 VIEW COMPARISON:  Radiographs 01/06/2023 FINDINGS: Demineralization. There is new cortical disruption and resorption about the head of the third toe proximal phalanx. The third toe mid and distal phalanx are not well visualized. Dislocation of the middle phalanx of the fourth toe in relation to the proximal phalanx. Plantar calcaneal spur. Vascular calcifications. Diffuse soft tissue swelling about the foot. Soft tissue ulcer about the dorsum of the foot at the MTP joint on lateral view. IMPRESSION: 1. Third toe osteomyelitis. 2. Dislocation of the right fourth toe middle phalanx. 3. Diffuse soft tissue swelling about the foot. Electronically Signed   By: Minerva Fester M.D.   On:  03/01/2023 19:58    Procedures Procedures    Medications Ordered in ED Medications  potassium chloride 10 mEq in 100 mL IVPB (10 mEq Intravenous New Bag/Given 03/01/23 2101)  vancomycin (VANCOREADY) IVPB 1750 mg/350 mL (has no administration in time range)  piperacillin-tazobactam (ZOSYN) IVPB 3.375 g (has no administration in time range)  piperacillin-tazobactam (ZOSYN) IVPB 3.375 g (has no administration in time range)  vancomycin (VANCOCIN) IVPB 1000 mg/200 mL premix (has no administration in time range)  acetaminophen (TYLENOL) tablet 650 mg (has no administration in time range)    Or  acetaminophen (TYLENOL) suppository 650 mg (has no administration in time range)  melatonin tablet 3 mg (has no administration in time range)  ondansetron (ZOFRAN) injection 4 mg (has no administration in time range)  naloxone (NARCAN) injection 0.4 mg (has no administration in time range)  morphine (PF) 4 MG/ML injection 4 mg (has no administration in time range)  lactated ringers bolus 500 mL (has no administration in time range)  potassium chloride SA (KLOR-CON M) CR tablet 40 mEq (40 mEq Oral Given 03/01/23 1950)  oxyCODONE-acetaminophen (PERCOCET/ROXICET) 5-325 MG per tablet 1 tablet (1 tablet Oral Given 03/01/23 2045)    ED Course/ Medical Decision Making/ A&P                             Medical Decision Making Amount and/or Complexity of Data Reviewed Labs: ordered. Radiology: ordered.  Risk Prescription drug management. Decision regarding hospitalization.   This patient presents to the ED for concern of foot and leg wounds, this involves an extensive number of treatment options, and is a complaint that carries with it a high risk of complications and morbidity.  The differential diagnosis includes cellulitis, venous stasis, poor oncotic pressure, infected wound, osteomyelitis   Co morbidities that complicate the patient evaluation  arthritis, anxiety, CAD, depression, HLD, stenosis s/p  TAVR   Additional history obtained:  Additional history obtained from N/A External records from outside source obtained and reviewed including EMR   Lab Tests:  I Ordered, and personally interpreted labs.  The pertinent results include: Hypokalemia, baseline creatinine, baseline anemia, no leukocytosis   Imaging Studies ordered:  I ordered imaging studies including right foot x-ray, MRI right foot I independently visualized and interpreted imaging which showed x-ray shows concern of third toe osteomyelitis.  MRI foot was pending at time of admission. I agree with the radiologist interpretation   Cardiac Monitoring: / EKG:  The patient was maintained on a cardiac monitor.  I personally viewed and interpreted the cardiac monitored which showed an underlying rhythm of: Sinus rhythm   Consultations Obtained:  I requested consultation with the podiatrist, Dr. Allena Katz,  and discussed lab and imaging findings as well as pertinent plan - they recommend: Consultation to orthopedic surgery given the patient is now established with [facility]. I requested consultation with the orthopedic surgeon, Dr. Steward Drone,  and discussed lab and imaging findings as well as pertinent plan - they recommend: N.p.o. at midnight.  Dr. Lajoyce Corners will see in the morning.   Problem List / ED Course / Critical interventions / Medication management  Patient presents for new right foot wounds in addition to increased swelling and redness to lower extremities.  Vital signs on arrival notable for hypertension.  On exam, patient is alert and oriented.  Oral mucosa is dry.  She has pitting edema in lower extremities.  She has a large wound to plantar aspect of the first MTP.  She has less prominent wounds and into webspaces.  She denies any significant pain to these areas.  Workup was initiated.  Lab work is notable for hypokalemia.  Replacement potassium was ordered.  X-ray imaging of right foot shows concern of third  digit osteomyelitis.  MRI was ordered to further evaluate.  Antibiotics were started.  Orthopedic surgery was notified and they will see in consult.  Patient was admitted medicine for further management. I  ordered medication including potassium chloride for hypokalemia; Percocet for analgesia; IV fluids for hydration; vancomycin and Zosyn for empiric treatment of osteomyelitis Reevaluation of the patient after these medicines showed that the patient improved I have reviewed the patients home medicines and have made adjustments as needed   Social Determinants of Health:  Lives independently, nonambulatory at baseline        Final Clinical Impression(s) / ED Diagnoses Final diagnoses:  Ulcer of right foot, unspecified ulcer stage [facility])    Rx / DC Orders ED Discharge Orders     None         Gloris Manchester, MD 03/01/23 2120

## 2023-03-01 NOTE — ED Notes (Signed)
Tried to call floor, no answer. Sending patient up.

## 2023-03-01 NOTE — ED Notes (Signed)
ED TO INPATIENT HANDOFF REPORT  ED Nurse Name and Phone #: Alycia Rossetti [phone]   S Name/Age/Gender [name] 89 y.o. female Room/Bed: 024C/024C  Code Status   Code Status: Full Code  Home/SNF/Other Home Patient oriented to: self, place, time, and situation Is this baseline? Yes   Triage Complete: Triage complete  Chief Complaint Osteomyelitis of right lower extremity [facility]) [M86.9]  Triage Note PT arrives via EMS from home. Pt reports she has been on multiple abx recently for cellulitis of lower extremities. PT states she also has a wound on the bottom of her right foot. Pt denies fevers. She is AxO4. NAD at this time.    Allergies No Active Allergies  Level of Care/Admitting Diagnosis ED Disposition     ED Disposition  Admit   Condition  --   Comment  Hospital Area: [name] [[id number]]  Level of Care: Telemetry Medical [104]  May admit patient to [facility] or [facility] if equivalent level of care is available:: No  Covid Evaluation: Asymptomatic - no recent exposure (last 10 days) testing not required  Diagnosis: Osteomyelitis of right lower extremity [facility]) [[id number]]  Admitting Physician: Angie Fava [[id number]]  Attending Physician: Angie Fava [[id number]]  Certification:: I certify this patient will need inpatient services for at least 2 midnights  Estimated Length of Stay: 2          B Medical/Surgery History Past Medical History:  Diagnosis Date   Anxiety    Arthritis    CAD (coronary artery disease)    Mild at cardiac catheterization 2018   Chronic bronchitis    Constipation    Depression    Hyperlipidemia    Hypokalemia 02/12/2017   Hypothyroidism    S/P TAVR (transcatheter aortic valve replacement) 05/25/2022   26mm S3UR via TF approach with Dr. Clifton James and Dr. Laneta Simmers   Severe aortic stenosis    Wound of left leg 01/07/2023   Past Surgical History:  Procedure Laterality Date   BUNIONECTOMY Left 1975    CARDIAC CATHETERIZATION  07/04/2020   CHOLECYSTECTOMY N/A 02/15/2017   Procedure: LAPAROSCOPIC CHOLECYSTECTOMY;  Surgeon: Axel Filler, MD;  Location: [facility] OR;  Service: General;  Laterality: N/A;   CORONARY ANGIOGRAPHY N/A 02/14/2017   Procedure: Coronary Angiography;  Surgeon: Yvonne Kendall, MD;  Location: [facility] INVASIVE CV LAB;  Service: Cardiovascular;  Laterality: N/A;   EYE SURGERY  2009   bilateral   HEMATOMA EVACUATION  06/04/2011   Procedure: EVACUATION HEMATOMA;  Surgeon: Fuller Canada, MD;  Location: [facility] ORS;  Service: Orthopedics;  Laterality: Right;  evacuation of seroma   INTRAOPERATIVE TRANSTHORACIC ECHOCARDIOGRAM N/A 05/25/2022   Procedure: INTRAOPERATIVE TRANSTHORACIC ECHOCARDIOGRAM;  Surgeon: Kathleene Hazel, MD;  Location: [facility] INVASIVE CV LAB;  Service: Open Heart Surgery;  Laterality: N/A;   JOINT REPLACEMENT  05/2010   left total knee Dr. Romeo Apple   PATELLAR TENDON REPAIR  06/04/2011   Procedure: PATELLA TENDON REPAIR;  Surgeon: Fuller Canada, MD;  Location: [facility] ORS;  Service: Orthopedics;  Laterality: Right;   PATELLAR TENDON REPAIR  08/30/2011   Procedure: PATELLA TENDON REPAIR;  Surgeon: Fuller Canada, MD;  Location: [facility] ORS;  Service: Orthopedics;  Laterality: Right;  Allograft Reconstrucation Right Patella Tendon   RIGHT/LEFT HEART CATH AND CORONARY ANGIOGRAPHY N/A 07/04/2020   Procedure: RIGHT/LEFT HEART CATH AND CORONARY ANGIOGRAPHY;  Surgeon: Kathleene Hazel, MD;  Location: [facility] INVASIVE CV LAB;  Service: Cardiovascular;  Laterality: N/A;   TOTAL HIP ARTHROPLASTY Left 1998  TOTAL KNEE ARTHROPLASTY  05/03/2011   Procedure: TOTAL KNEE ARTHROPLASTY;  Surgeon: Fuller Canada, MD;  Location: [facility] ORS;  Service: Orthopedics;  Laterality: Right;  With DePuy   TOTAL THYROIDECTOMY  11/2009   Dr. Suszanne Conners @ [facility]   TRANSCATHETER AORTIC VALVE REPLACEMENT, TRANSFEMORAL Right 05/25/2022   Procedure: Transcatheter Aortic Valve Replacement, Transfemoral;   Surgeon: Kathleene Hazel, MD;  Location: [facility] INVASIVE CV LAB;  Service: Open Heart Surgery;  Laterality: Right;   TUBAL LIGATION       A IV Location/Drains/Wounds Patient Lines/Drains/Airways Status     Active Line/Drains/Airways     Name Placement date Placement time Site Days   Peripheral IV 03/01/23 20 G Anterior;Proximal;Right Forearm 03/01/23  1954  Forearm  less than 1   Peripheral IV 03/01/23 22 G Anterior;Left Forearm 03/01/23  2115  Forearm  less than 1   External Urinary Catheter 01/07/23  0900  --  53   Incision - 4 Ports Abdomen 1: Umbilicus 2: Medial;Upper 3: Medial;Right Right;Lateral 02/15/17  1432  -- 2205   Pressure Injury 01/06/23 Buttocks Bilateral Stage 2 -  Partial thickness loss of dermis presenting as a shallow open injury with a red, pink wound bed without slough. 01/06/23  1956  -- 54   Wound / Incision (Open or Dehisced) 01/06/23 Irritant Dermatitis (Moisture Associated Skin Damage) Groin Bilateral 01/06/23  1957  Groin  54   Wound / Incision (Open or Dehisced) 01/06/23 Irritant Dermatitis (Moisture Associated Skin Damage) Breast Lower;Right 01/06/23  1957  Breast  54   Wound / Incision (Open or Dehisced) 01/06/23 Irritant Dermatitis (Moisture Associated Skin Damage) Abdomen Lower;Bilateral 01/06/23  1958  Abdomen  54   Wound / Incision (Open or Dehisced) 01/06/23 Toe (Comment  which one) Anterior;Right 01/06/23  1959  Toe (Comment  which one)  54   Wound / Incision (Open or Dehisced) 01/06/23 Toe (Comment  which one) Anterior;Left 01/06/23  1959  Toe (Comment  which one)  54   Wound / Incision (Open or Dehisced) 01/06/23 Pretibial Distal;Right 01/06/23  2000  Pretibial  54   Wound / Incision (Open or Dehisced) 01/06/23 Pretibial Left 01/06/23  2000  Pretibial  54            Intake/Output Last 24 hours No intake or output data in the 24 hours ending 03/01/23 2238  Labs/Imaging Results for orders placed or performed during the hospital encounter of  03/01/23 (from the past 48 hour(s))  Urinalysis, Routine w reflex microscopic -Urine, Clean Catch     Status: Abnormal   Collection Time: 03/01/23  1:49 PM  Result Value Ref Range   Color, Urine STRAW (A) YELLOW   APPearance CLEAR CLEAR   Specific Gravity, Urine 1.008 1.005 - 1.030   pH 7.0 5.0 - 8.0   Glucose, UA NEGATIVE NEGATIVE mg/dL   Hgb urine dipstick NEGATIVE NEGATIVE   Bilirubin Urine NEGATIVE NEGATIVE   Ketones, ur NEGATIVE NEGATIVE mg/dL   Protein, ur NEGATIVE NEGATIVE mg/dL   Nitrite NEGATIVE NEGATIVE   Leukocytes,Ua NEGATIVE NEGATIVE    Comment: Performed at [facility], [address], [place], [place] [address]  Comprehensive metabolic panel     Status: Abnormal   Collection Time: 03/01/23  1:57 PM  Result Value Ref Range   Sodium 140 135 - 145 mmol/L   Potassium 2.9 (L) 3.5 - 5.1 mmol/L   Chloride 100 98 - 111 mmol/L   CO2 29 22 - 32 mmol/L   Glucose, Bld  96 70 - 99 mg/dL    Comment: Glucose reference range applies only to samples taken after fasting for at least 8 hours.   BUN 24 (H) 8 - 23 mg/dL   Creatinine, Ser 0.98 (H) 0.44 - 1.00 mg/dL   Calcium 8.4 (L) 8.9 - 10.3 mg/dL   Total Protein 5.7 (L) 6.5 - 8.1 g/dL   Albumin 2.8 (L) 3.5 - 5.0 g/dL   AST 30 15 - 41 U/L   ALT 19 0 - 44 U/L   Alkaline Phosphatase 90 38 - 126 U/L   Total Bilirubin 0.6 0.3 - 1.2 mg/dL   GFR, Estimated 51 (L) >60 mL/min    Comment: (NOTE) Calculated using the CKD-EPI Creatinine Equation (2021)    Anion gap 11 5 - 15    Comment: Performed at [facility], [address], [place], [place] [address]  CBC with Differential     Status: Abnormal   Collection Time: 03/01/23  1:57 PM  Result Value Ref Range   WBC 7.4 4.0 - 10.5 K/uL   RBC 3.99 3.87 - 5.11 MIL/uL   Hemoglobin 11.0 (L) 12.0 - 15.0 g/dL   HCT 78.2 (L) 95.6 - 21.3 %   MCV 89.7 80.0 - 100.0 fL   MCH 27.6 26.0 - 34.0 pg   MCHC 30.7 30.0 - 36.0 g/dL   RDW 08.6 57.8 - 46.9 %   Platelets 240 150 - 400 K/uL    nRBC 0.0 0.0 - 0.2 %   Neutrophils Relative % 67 %   Neutro Abs 5.0 1.7 - 7.7 K/uL   Lymphocytes Relative 20 %   Lymphs Abs 1.5 0.7 - 4.0 K/uL   Monocytes Relative 8 %   Monocytes Absolute 0.6 0.1 - 1.0 K/uL   Eosinophils Relative 3 %   Eosinophils Absolute 0.2 0.0 - 0.5 K/uL   Basophils Relative 1 %   Basophils Absolute 0.1 0.0 - 0.1 K/uL   Immature Granulocytes 1 %   Abs Immature Granulocytes 0.04 0.00 - 0.07 K/uL    Comment: Performed at [facility], [address] [place], [place] [address]  Magnesium     Status: None   Collection Time: 03/01/23  7:43 PM  Result Value Ref Range   Magnesium 2.0 1.7 - 2.4 mg/dL    Comment: Performed at [facility], [address] [place], [place] [address]  Sedimentation rate     Status: Abnormal   Collection Time: 03/01/23  7:43 PM  Result Value Ref Range   Sed Rate 33 (H) 0 - 22 mm/hr    Comment: Performed at [facility], [address] [place], [place] [address]  C-reactive protein     Status: Abnormal   Collection Time: 03/01/23  7:43 PM  Result Value Ref Range   CRP 2.9 (H) <1.0 mg/dL    Comment: Performed at [facility], [address], [place], [place] [address]   DG Foot 2 Views Right  Result Date: 03/01/2023 CLINICAL DATA:  Foot wound on bottom of right foot EXAM: RIGHT FOOT - 2 VIEW COMPARISON:  Radiographs 01/06/2023 FINDINGS: Demineralization. There is new cortical disruption and resorption about the head of the third toe proximal phalanx. The third toe mid and distal phalanx are not well visualized. Dislocation of the middle phalanx of the fourth toe in relation to the proximal phalanx. Plantar calcaneal spur. Vascular calcifications. Diffuse soft tissue swelling about the foot. Soft tissue ulcer about the dorsum  of the foot at the MTP joint on lateral view. IMPRESSION: 1. Third toe osteomyelitis. 2. Dislocation of the right fourth toe middle phalanx. 3. Diffuse soft tissue swelling about the foot.  Electronically Signed   By: Minerva Fester M.D.   On: 03/01/2023 19:58    Pending Labs Unresulted Labs (From admission, onward)     Start     Ordered   03/02/23 0500  CBC with Differential/Platelet  Tomorrow morning,   R        03/01/23 2110   03/02/23 0500  Comprehensive metabolic panel  Tomorrow morning,   R        03/01/23 2110   03/02/23 0500  Magnesium  Tomorrow morning,   R        03/01/23 2110   03/02/23 0500  Protime-INR  Tomorrow morning,   R        03/01/23 2110   03/01/23 2143  Type and screen [name]  Once,   R       Comments: [facility]    03/01/23 2142   03/01/23 2041  Blood culture (routine x 2)  BLOOD CULTURE X 2,   R (with STAT occurrences)      03/01/23 2041            Vitals/Pain Today's Vitals   03/01/23 1915 03/01/23 2100 03/01/23 2130 03/01/23 2158  BP: (!) 158/72  (!) 153/94   Pulse: 94  96   Resp:   19   Temp:      TempSrc:      SpO2: 93%  94%   Weight:  83 kg    PainSc:    8     Isolation Precautions No active isolations  Medications Medications  potassium chloride 10 mEq in 100 mL IVPB (0 mEq Intravenous Stopped 03/01/23 2136)  vancomycin (VANCOREADY) IVPB 1750 mg/350 mL (has no administration in time range)  piperacillin-tazobactam (ZOSYN) IVPB 3.375 g (has no administration in time range)  vancomycin (VANCOCIN) IVPB 1000 mg/200 mL premix (has no administration in time range)  acetaminophen (TYLENOL) tablet 650 mg (has no administration in time range)    Or  acetaminophen (TYLENOL) suppository 650 mg (has no administration in time range)  melatonin tablet 3 mg (has no administration in time range)  ondansetron (ZOFRAN) injection 4 mg (has no administration in time range)  naloxone (NARCAN) injection 0.4 mg (has no administration in time range)  morphine (PF) 4 MG/ML injection 4 mg (4 mg Intravenous Given 03/01/23 2200)  lactated ringers infusion (has no administration in time range)  albuterol  (PROVENTIL) (2.5 MG/3ML) 0.083% nebulizer solution 2.5 mg (has no administration in time range)  potassium chloride SA (KLOR-CON M) CR tablet 40 mEq (40 mEq Oral Given 03/01/23 1950)  oxyCODONE-acetaminophen (PERCOCET/ROXICET) 5-325 MG per tablet 1 tablet (1 tablet Oral Given 03/01/23 2045)  piperacillin-tazobactam (ZOSYN) IVPB 3.375 g (0 g Intravenous Stopped 03/01/23 2228)  lactated ringers bolus 500 mL (0 mLs Intravenous Stopped 03/01/23 2228)  gadobutrol (GADAVIST) 1 MMOL/ML injection 8 mL (8 mLs Intravenous Contrast Given 03/01/23 2224)    Mobility walks with device     Focused Assessments    R Recommendations: See Admitting Provider Note  Report given to:   Additional Notes: Pt is extremely friendly patient who is here for foot wounds.

## 2023-03-01 NOTE — ED Triage Notes (Signed)
PT arrives via EMS from home. Pt reports she has been on multiple abx recently for cellulitis of lower extremities. PT states she also has a wound on the bottom of her right foot. Pt denies fevers. She is AxO4. NAD at this time.

## 2023-03-02 ENCOUNTER — Other Ambulatory Visit: Payer: Self-pay

## 2023-03-02 ENCOUNTER — Inpatient Hospital Stay (HOSPITAL_COMMUNITY): Payer: 59

## 2023-03-02 DIAGNOSIS — I739 Peripheral vascular disease, unspecified: Secondary | ICD-10-CM | POA: Diagnosis not present

## 2023-03-02 DIAGNOSIS — I70261 Atherosclerosis of native arteries of extremities with gangrene, right leg: Secondary | ICD-10-CM

## 2023-03-02 DIAGNOSIS — M869 Osteomyelitis, unspecified: Secondary | ICD-10-CM | POA: Diagnosis not present

## 2023-03-02 LAB — CBC WITH DIFFERENTIAL/PLATELET
Abs Immature Granulocytes: 0.06 10*3/uL (ref 0.00–0.07)
Basophils Absolute: 0 10*3/uL (ref 0.0–0.1)
Basophils Relative: 0 %
Eosinophils Absolute: 0.1 10*3/uL (ref 0.0–0.5)
Eosinophils Relative: 1 %
HCT: 32.5 % — ABNORMAL LOW (ref 36.0–46.0)
Hemoglobin: 10.2 g/dL — ABNORMAL LOW (ref 12.0–15.0)
Immature Granulocytes: 1 %
Lymphocytes Relative: 17 %
Lymphs Abs: 1.4 10*3/uL (ref 0.7–4.0)
MCH: 27.7 pg (ref 26.0–34.0)
MCHC: 31.4 g/dL (ref 30.0–36.0)
MCV: 88.3 fL (ref 80.0–100.0)
Monocytes Absolute: 0.4 10*3/uL (ref 0.1–1.0)
Monocytes Relative: 4 %
Neutro Abs: 6.1 10*3/uL (ref 1.7–7.7)
Neutrophils Relative %: 77 %
Platelets: 235 10*3/uL (ref 150–400)
RBC: 3.68 MIL/uL — ABNORMAL LOW (ref 3.87–5.11)
RDW: 15.3 % (ref 11.5–15.5)
WBC: 8 10*3/uL (ref 4.0–10.5)
nRBC: 0 % (ref 0.0–0.2)

## 2023-03-02 LAB — PROTIME-INR
INR: 1 (ref 0.8–1.2)
Prothrombin Time: 13.5 seconds (ref 11.4–15.2)

## 2023-03-02 LAB — COMPREHENSIVE METABOLIC PANEL
ALT: 17 U/L (ref 0–44)
AST: 29 U/L (ref 15–41)
Albumin: 2.5 g/dL — ABNORMAL LOW (ref 3.5–5.0)
Alkaline Phosphatase: 70 U/L (ref 38–126)
Anion gap: 12 (ref 5–15)
BUN: 16 mg/dL (ref 8–23)
CO2: 26 mmol/L (ref 22–32)
Calcium: 7.8 mg/dL — ABNORMAL LOW (ref 8.9–10.3)
Chloride: 102 mmol/L (ref 98–111)
Creatinine, Ser: 0.86 mg/dL (ref 0.44–1.00)
GFR, Estimated: >60 mL/min (ref >60)
Glucose, Bld: 87 mg/dL (ref 70–99)
Potassium: 3.5 mmol/L (ref 3.5–5.1)
Sodium: 140 mmol/L (ref 135–145)
Total Bilirubin: 0.7 mg/dL (ref 0.3–1.2)
Total Protein: 5.2 g/dL — ABNORMAL LOW (ref 6.5–8.1)

## 2023-03-02 LAB — CULTURE, BLOOD (ROUTINE X 2)

## 2023-03-02 LAB — VAS US ABI WITH/WO TBI
Left ABI: 0.36
Right ABI: 0.49

## 2023-03-02 LAB — TYPE AND SCREEN
ABO/RH(D): O POS
Antibody Screen: NEGATIVE

## 2023-03-02 LAB — MAGNESIUM: Magnesium: 1.8 mg/dL (ref 1.7–2.4)

## 2023-03-02 NOTE — Consult Note (Signed)
ORTHOPAEDIC CONSULTATION  REQUESTING PHYSICIAN: Willeen Niece, MD  Chief Complaint: Painful ulcerations right forefoot.  HPI: [name] is a 89 y.o. female who presents with necrotic ulcer to the first metatarsal head right foot with cellulitis extending to the ankles bilaterally.  Patient has necrotic changes to the right fourth toe.  Past Medical History:  Diagnosis Date   Anxiety    Arthritis    CAD (coronary artery disease)    Mild at cardiac catheterization 2018   Chronic bronchitis    Constipation    Depression    Hyperlipidemia    Hypokalemia 02/12/2017   Hypothyroidism    S/P TAVR (transcatheter aortic valve replacement) 05/25/2022   26mm S3UR via TF approach with Dr. Clifton James and Dr. Laneta Simmers   Severe aortic stenosis    Wound of left leg 01/07/2023   Past Surgical History:  Procedure Laterality Date   BUNIONECTOMY Left 1975   CARDIAC CATHETERIZATION  07/04/2020   CHOLECYSTECTOMY N/A 02/15/2017   Procedure: LAPAROSCOPIC CHOLECYSTECTOMY;  Surgeon: Axel Filler, MD;  Location: [facility] OR;  Service: General;  Laterality: N/A;   CORONARY ANGIOGRAPHY N/A 02/14/2017   Procedure: Coronary Angiography;  Surgeon: Yvonne Kendall, MD;  Location: [facility];  Service: Cardiovascular;  Laterality: N/A;   EYE SURGERY  2009   bilateral   HEMATOMA EVACUATION  06/04/2011   Procedure: EVACUATION HEMATOMA;  Surgeon: Fuller Canada, MD;  Location: [facility];  Service: Orthopedics;  Laterality: Right;  evacuation of seroma   INTRAOPERATIVE TRANSTHORACIC ECHOCARDIOGRAM N/A 05/25/2022   Procedure: INTRAOPERATIVE TRANSTHORACIC ECHOCARDIOGRAM;  Surgeon: Kathleene Hazel, MD;  Location: [facility];  Service: Open Heart Surgery;  Laterality: N/A;   JOINT REPLACEMENT  05/2010   left total knee Dr. Romeo Apple   PATELLAR TENDON REPAIR  06/04/2011   Procedure: PATELLA TENDON REPAIR;  Surgeon: Fuller Canada, MD;  Location: [facility];  Service: Orthopedics;   Laterality: Right;   PATELLAR TENDON REPAIR  08/30/2011   Procedure: PATELLA TENDON REPAIR;  Surgeon: Fuller Canada, MD;  Location: [facility];  Service: Orthopedics;  Laterality: Right;  Allograft Reconstrucation Right Patella Tendon   RIGHT/LEFT HEART CATH AND CORONARY ANGIOGRAPHY N/A 07/04/2020   Procedure: RIGHT/LEFT HEART CATH AND CORONARY ANGIOGRAPHY;  Surgeon: Kathleene Hazel, MD;  [facility];  Service: Cardiovascular;  Laterality: N/A;   TOTAL HIP ARTHROPLASTY Left 1998   TOTAL KNEE ARTHROPLASTY  05/03/2011   Procedure: TOTAL KNEE ARTHROPLASTY;  Surgeon: Fuller Canada, MD;  Location: [facility];  Service: Orthopedics;  Laterality: Right;  With DePuy   TOTAL THYROIDECTOMY  11/2009   Dr. Suszanne Conners @ [facility]   TRANSCATHETER AORTIC VALVE REPLACEMENT, TRANSFEMORAL Right 05/25/2022   Procedure: Transcatheter Aortic Valve Replacement, Transfemoral;  Surgeon: Kathleene Hazel, MD;  Location: [facility] INVASIVE CV LAB;  Service: Open Heart Surgery;  Laterality: Right;   TUBAL LIGATION     Social History   Socioeconomic History   Marital status: Widowed    Spouse name: Not on file   Number of children: 2   Years of education: Not on file   Highest education level: Not on file  Occupational History   Occupation: Retired-Office work  Tobacco Use   Smoking status: Former    Types: Cigarettes    Quit date: 09/27/1994    Years since quitting: 28.4   Smokeless tobacco: Never  Vaping Use   Vaping Use: Never used  Substance and Sexual Activity   Alcohol use: No   Drug use:  No   Sexual activity: Not Currently  Other Topics Concern   Not on file  Social History Narrative   ** Merged History Encounter **       Social Determinants of Health   Financial Resource Strain: Low Risk  (01/28/2023)   Overall Financial Resource Strain (CARDIA)    Difficulty of Paying Living Expenses: Not very hard  Food Insecurity: No Food Insecurity (01/20/2023)   Hunger Vital Sign    Worried  About Running Out of Food in the Last Year: Never true    Ran Out of Food in the Last Year: Never true  Transportation Needs: No Transportation Needs (01/28/2023)   PRAPARE - Administrator, Civil Service (Medical): No    Lack of Transportation (Non-Medical): No  Physical Activity: Not on file  Stress: Not on file  Social Connections: Not on file   Family History  Problem Relation Age of Onset   Colon cancer Mother    Blindness Sister    Anesthesia problems Neg Hx    Hypotension Neg Hx    Malignant hyperthermia Neg Hx    Pseudochol deficiency Neg Hx    - negative except otherwise stated in the family history section No Active Allergies Prior to Admission medications   Medication Sig Start Date End Date Taking? Authorizing Provider  acetaminophen (TYLENOL) 325 MG tablet Take 2 tablets (650 mg total) by mouth every 6 (six) hours as needed for mild pain (or Fever >/= 101). 01/08/23  Yes Erick Alley, DO  amoxicillin (AMOXIL) 875 MG tablet Take 875 mg by mouth 2 (two) times daily. 02/24/23  Yes [provider]  furosemide (LASIX) 40 MG tablet Take 1.5 tablets (60 mg total) by mouth daily. Patient taking differently: Take 40 mg by mouth daily. 06/30/22  Yes Janetta Hora, PA-C  ibuprofen (ADVIL) 200 MG tablet Take 600 mg by mouth every 6 (six) hours as needed for fever, mild pain or moderate pain.   Yes [provider]  levothyroxine (SYNTHROID) 150 MCG tablet Take 150 mcg by mouth daily before breakfast.   Yes [provider]  oxybutynin (DITROPAN XL) 15 MG 24 hr tablet Take 15 mg by mouth daily.   Yes [provider]  PARoxetine (PAXIL) 20 MG tablet Take 20 mg by mouth daily.   Yes [provider]  nitroGLYCERIN (NITROSTAT) 0.4 MG SL tablet Place 1 tablet (0.4 mg total) under the tongue every 5 (five) minutes as needed for chest pain. 03/09/22   Strader, Lennart Pall, PA-C  potassium chloride SA (KLOR-CON M) 20 MEQ tablet Take 1.5  tablets (30 mEq total) by mouth daily. 07/01/22   Janetta Hora, PA-C   MR FOOT RIGHT W WO CONTRAST  Result Date: 03/01/2023 CLINICAL DATA:  Soft tissue infection suspected. Multiple wounds along the forefoot and big toe. EXAM: MRI OF THE RIGHT FOREFOOT WITHOUT AND WITH CONTRAST TECHNIQUE: Multiplanar, multisequence MR imaging of the right forefoot was performed before and after the administration of intravenous contrast. CONTRAST:  8mL GADAVIST GADOBUTROL 1 MMOL/ML IV SOLN COMPARISON:  Radiograph performed earlier on the same date. FINDINGS: Bones/Joint/Cartilage There is bone marrow edema of the proximal middle and distal phalanges of the third and fourth digits suggesting osteomyelitis. Marrow signal within remaining osseous structures is within normal limits. Marked degenerative changes of the first metatarsophalangeal joints with hallux valgus deformity. Ligaments Lisfranc ligament is intact. Muscles and Tendons Increased intramuscular signal of the plantar muscles suggesting diabetic myopathy/myositis. Soft tissues Marked subcutaneous  soft tissue edema and inflammatory changes most prominent of the third and fourth digits. Generalized subcutaneous soft tissue edema about the foot. No fluid collection or abscess. IMPRESSION: 1. Bone marrow edema of the proximal, middle and distal phalanges of the third and fourth digits consistent with osteomyelitis. 2. Marked subcutaneous soft tissue edema and inflammatory changes most prominent of the third and fourth digits consistent with cellulitis. No fluid collection or abscess. 3. Increased intramuscular signal of the plantar muscles suggesting diabetic myopathy/myositis. 4. Marked degenerative changes of the first metatarsophalangeal joints with hallux valgus deformity. Electronically Signed   By: Larose Hires D.O.   On: 03/01/2023 22:54   DG Foot 2 Views Right  Result Date: 03/01/2023 CLINICAL DATA:  Foot wound on bottom of right foot EXAM: RIGHT FOOT - 2  VIEW COMPARISON:  Radiographs 01/06/2023 FINDINGS: Demineralization. There is new cortical disruption and resorption about the head of the third toe proximal phalanx. The third toe mid and distal phalanx are not well visualized. Dislocation of the middle phalanx of the fourth toe in relation to the proximal phalanx. Plantar calcaneal spur. Vascular calcifications. Diffuse soft tissue swelling about the foot. Soft tissue ulcer about the dorsum of the foot at the MTP joint on lateral view. IMPRESSION: 1. Third toe osteomyelitis. 2. Dislocation of the right fourth toe middle phalanx. 3. Diffuse soft tissue swelling about the foot. Electronically Signed   By: Minerva Fester M.D.   On: 03/01/2023 19:58   - pertinent xrays, CT, MRI studies were reviewed and independently interpreted  Positive ROS: All other systems have been reviewed and were otherwise negative with the exception of those mentioned in the HPI and as above.  Physical Exam: General: Alert, no acute distress Psychiatric: Patient is competent for consent with normal mood and affect Lymphatic: No axillary or cervical lymphadenopathy Cardiovascular: No pedal edema Respiratory: No cyanosis, no use of accessory musculature GI: No organomegaly, abdomen is soft and non-tender    Images:  @ENCIMAGES @  Labs:  Lab Results  Component Value Date   HGBA1C 5.1 01/07/2023   ESRSEDRATE 33 (H) 03/01/2023   ESRSEDRATE 44 (H) 01/06/2023   ESRSEDRATE 44 (H) 05/29/2011   CRP 2.9 (H) 03/01/2023   CRP 3.4 (H) 01/06/2023   CRP 20.27 (H) 05/29/2011   REPTSTATUS 01/11/2023 FINAL 01/06/2023   GRAMSTAIN  06/04/2011    ABUNDANT WBC PRESENT, PREDOMINANTLY PMN NO SQUAMOUS EPITHELIAL CELLS SEEN NO ORGANISMS SEEN   GRAMSTAIN  06/04/2011    ABUNDANT WBC PRESENT, PREDOMINANTLY PMN NO SQUAMOUS EPITHELIAL CELLS SEEN NO ORGANISMS SEEN   CULT  01/06/2023    NO GROWTH 5 DAYS Performed at [facility], [address] [place], [place] [address]      Lab Results  Component Value Date   ALBUMIN 2.5 (L) 03/02/2023   ALBUMIN 2.8 (L) 03/01/2023   ALBUMIN 3.2 (L) 01/06/2023        Latest Ref Rng & Units 03/02/2023    2:42 AM 03/01/2023    1:57 PM 01/07/2023   12:29 AM  CBC EXTENDED  WBC 4.0 - 10.5 K/uL 8.0  7.4  8.8   RBC 3.87 - 5.11 MIL/uL 3.68  3.99  3.54   Hemoglobin 12.0 - 15.0 g/dL 91.4  78.2  95.6   HCT 36.0 - 46.0 % 32.5  35.8  31.0   Platelets 150 - 400 K/uL 235  240  257   NEUT# 1.7 - 7.7 K/uL 6.1  5.0    Lymph# 0.7 - 4.0  K/uL 1.4  1.5      Neurologic: Patient does not have protective sensation bilateral lower extremities.   MUSCULOSKELETAL:   Skin: Examination there is induration and cellulitis of both feet and ankles.  No cellulitis proximal to the ankles.  I cannot palpate a dorsalis pedis pulse bilaterally.  Patient has a large necrotic ulcer on the plantar aspect of the first metatarsal head right foot there is necrotic changes to the fourth toe.  Hemoglobin 10.2 with a white cell count of 8.0.  Albumin 2.5.  Review of the MRI scan shows edema within the third and fourth toes consistent with osteomyelitis.  There is also an ulceration extends down to the fascia of the great toe MTP joint with edema over the medial border of the first metatarsal head consistent with early osteomyelitis of the first metatarsal head.  Hemoglobin A1c 5.1.  Assessment: Assessment: Osteomyelitis first metatarsal head with Wagner grade 3 ulceration with ischemic ulceration to the fourth toe and osteomyelitis of the third and fourth toes.  Plan: I will order an ABI study to further evaluate her circulation to the ankle.  If patient has adequate circulation she may require a transmetatarsal amputation.  May require vascular surgery consult.  Thank you for the consult and the opportunity to see Ms. Roni Bread, MD [facility] [phone] 7:08 AM

## 2023-03-02 NOTE — Consult Note (Signed)
[facility] [place]  ASSESSMENT / PLAN: 89 y.o. female with right foot osteomyelitis.  She is not a revascularization candidate.  Agree with checking an ankle-brachial index to evaluate healing potential given nonpalpable pedal pulses.  If her absolute toe pressures greater than 60, would recommend proceeding with a foot amputation.  If absolute toe pressure is less than 60, would need a major amputation, likely an above-knee amputation.  Palliative care consultation may be helpful.  CHIEF COMPLAINT: Right foot osteomyelitis  HISTORY OF PRESENT ILLNESS: [name] is a 89 y.o. female admitted to the internal medicine service for right foot osteomyelitis.  Over the past week, the patient has had worsening pain over the right foot.  She cannot tell me when the ulceration over her right foot started.  She is nonambulatory.  She mobilizes in a wheelchair.  She has chronic flexion contractures of bilateral knees.  Past Medical History:  Diagnosis Date   Anxiety    Arthritis    CAD (coronary artery disease)    Mild at cardiac catheterization 2018   Chronic bronchitis    Constipation    Depression    Hyperlipidemia    Hypokalemia 02/12/2017   Hypothyroidism    S/P TAVR (transcatheter aortic valve replacement) 05/25/2022   26mm S3UR via TF approach with Dr. Clifton James and Dr. Laneta Simmers   Severe aortic stenosis    Wound of left leg 01/07/2023    Past Surgical History:  Procedure Laterality Date   BUNIONECTOMY Left 1975   CARDIAC CATHETERIZATION  07/04/2020   CHOLECYSTECTOMY N/A 02/15/2017   Procedure: LAPAROSCOPIC CHOLECYSTECTOMY;  Surgeon: Axel Filler, MD;  Location: [facility] OR;  Service: General;  Laterality: N/A;   CORONARY ANGIOGRAPHY N/A 02/14/2017   Procedure: Coronary Angiography;  Surgeon: Yvonne Kendall, MD;  [facility];  Service: Cardiovascular;  Laterality: N/A;   EYE SURGERY  2009   bilateral   HEMATOMA EVACUATION  06/04/2011    Procedure: EVACUATION HEMATOMA;  Surgeon: Fuller Canada, MD;  Location: [facility];  Service: Orthopedics;  Laterality: Right;  evacuation of seroma   INTRAOPERATIVE TRANSTHORACIC ECHOCARDIOGRAM N/A 05/25/2022   Procedure: INTRAOPERATIVE TRANSTHORACIC ECHOCARDIOGRAM;  Surgeon: Kathleene Hazel, MD;  [facility];  Service: Open Heart Surgery;  Laterality: N/A;   JOINT REPLACEMENT  05/2010   left total knee Dr. Romeo Apple   PATELLAR TENDON REPAIR  06/04/2011   Procedure: PATELLA TENDON REPAIR;  Surgeon: Fuller Canada, MD;  Location: [facility];  Service: Orthopedics;  Laterality: Right;   PATELLAR TENDON REPAIR  08/30/2011   Procedure: PATELLA TENDON REPAIR;  Surgeon: Fuller Canada, MD;  Location: [facility];  Service: Orthopedics;  Laterality: Right;  Allograft Reconstrucation Right Patella Tendon   RIGHT/LEFT HEART CATH AND CORONARY ANGIOGRAPHY N/A 07/04/2020   Procedure: RIGHT/LEFT HEART CATH AND CORONARY ANGIOGRAPHY;  Surgeon: Kathleene Hazel, MD;  [facility];  Service: Cardiovascular;  Laterality: N/A;   TOTAL HIP ARTHROPLASTY Left 1998   TOTAL KNEE ARTHROPLASTY  05/03/2011   Procedure: TOTAL KNEE ARTHROPLASTY;  Surgeon: Fuller Canada, MD;  Location: [facility];  Service: Orthopedics;  Laterality: Right;  With DePuy   TOTAL THYROIDECTOMY  11/2009   Dr. Suszanne Conners @ [facility]   TRANSCATHETER AORTIC VALVE REPLACEMENT, TRANSFEMORAL Right 05/25/2022   Procedure: Transcatheter Aortic Valve Replacement, Transfemoral;  Surgeon: Kathleene Hazel, MD;  Location: [facility] INVASIVE CV LAB;  Service: Open Heart Surgery;  Laterality: Right;   TUBAL LIGATION  Family History  Problem Relation Age of Onset   Colon cancer Mother    Blindness Sister    Anesthesia problems Neg Hx    Hypotension Neg Hx    Malignant hyperthermia Neg Hx    Pseudochol deficiency Neg Hx     Social History   Socioeconomic History   Marital status: Widowed    Spouse name: Not on file    Number of children: 2   Years of education: Not on file   Highest education level: Not on file  Occupational History   Occupation: Retired-Office work  Tobacco Use   Smoking status: Former    Types: Cigarettes    Quit date: 09/27/1994    Years since quitting: 28.4   Smokeless tobacco: Never  Vaping Use   Vaping Use: Never used  Substance and Sexual Activity   Alcohol use: No   Drug use: No   Sexual activity: Not Currently  Other Topics Concern   Not on file  Social History Narrative   ** Merged History Encounter **       Social Determinants of Health   Financial Resource Strain: Low Risk  (01/28/2023)   Overall Financial Resource Strain (CARDIA)    Difficulty of Paying Living Expenses: Not very hard  Food Insecurity: No Food Insecurity (03/02/2023)   Hunger Vital Sign    Worried About Running Out of Food in the Last Year: Never true    Ran Out of Food in the Last Year: Never true  Transportation Needs: No Transportation Needs (03/02/2023)   PRAPARE - Administrator, Civil Service (Medical): No    Lack of Transportation (Non-Medical): No  Physical Activity: Not on file  Stress: Not on file  Social Connections: Not on file  Intimate Partner Violence: Not At Risk (03/02/2023)   Humiliation, Afraid, Rape, and Kick questionnaire    Fear of Current or Ex-Partner: No    Emotionally Abused: No    Physically Abused: No    Sexually Abused: No    No Active Allergies  Current Facility-Administered Medications  Medication Dose Route Frequency Provider Last Rate Last Admin   acetaminophen (TYLENOL) tablet 650 mg  650 mg Oral Q6H PRN Howerter, Justin B, DO       Or   acetaminophen (TYLENOL) suppository 650 mg  650 mg Rectal Q6H PRN Howerter, Justin B, DO       albuterol (PROVENTIL) (2.5 MG/3ML) 0.083% nebulizer solution 2.5 mg  2.5 mg Nebulization Q4H PRN Howerter, Justin B, DO       melatonin tablet 3 mg  3 mg Oral QHS PRN Howerter, Justin B, DO       morphine (PF) 4  MG/ML injection 4 mg  4 mg Intravenous Q3H PRN Howerter, Justin B, DO   4 mg at 03/02/23 0230   naloxone (NARCAN) injection 0.4 mg  0.4 mg Intravenous PRN Howerter, Justin B, DO       ondansetron (ZOFRAN) injection 4 mg  4 mg Intravenous Q6H PRN Howerter, Justin B, DO       piperacillin-tazobactam (ZOSYN) IVPB 3.375 g  3.375 g Intravenous Q8H Francena Hanly, RPH 12.5 mL/hr at 03/02/23 0633 3.375 g at 03/02/23 0633   vancomycin (VANCOCIN) IVPB 1000 mg/200 mL premix  1,000 mg Intravenous Q24H Francena Hanly, [facility]        PHYSICAL EXAM Vitals:   03/01/23 2250 03/01/23 2350 03/02/23 0600 03/02/23 0734  BP:  (!) 146/60 102/82 (!) 121/40  Pulse:  100  97  Resp:  18  16  Temp: 98 F (36.7 C) 98.6 F (37 C) 98.6 F (37 C) 98 F (36.7 C)  TempSrc: Oral Oral Oral   SpO2:  97% 99% (!) 89%  Weight:       Elderly frail woman in no acute distress Regular rate Unlabored breathing Flexion contracture of bilateral lower extremities; patient unable to extend her knees.  Both knees fixed at about 90 degrees of flexion. No palpable pedal pulses Erythema bilateral feet and ankles Right third toe unstageable ulceration Right first MTP plantar ulceration       PERTINENT LABORATORY AND RADIOLOGIC DATA  Most recent CBC    Latest Ref Rng & Units 03/02/2023    2:42 AM 03/01/2023    1:57 PM 01/07/2023   12:29 AM  CBC  WBC 4.0 - 10.5 K/uL 8.0  7.4  8.8   Hemoglobin 12.0 - 15.0 g/dL 24.4  01.0  27.2   Hematocrit 36.0 - 46.0 % 32.5  35.8  31.0   Platelets 150 - 400 K/uL 235  240  257      Most recent CMP    Latest Ref Rng & Units 03/02/2023    2:42 AM 03/01/2023    1:57 PM 01/08/2023    1:19 AM  CMP  Glucose 70 - 99 mg/dL 87  96  83   BUN 8 - 23 mg/dL 16  24  31    Creatinine 0.44 - 1.00 mg/dL 5.36  6.44  0.34   Sodium 135 - 145 mmol/L 140  140  139   Potassium 3.5 - 5.1 mmol/L 3.5  2.9  4.0   Chloride 98 - 111 mmol/L 102  100  100   CO2 22 - 32 mmol/L 26  29  28    Calcium 8.9 - 10.3 mg/dL  7.8  8.4  8.3   Total Protein 6.5 - 8.1 g/dL 5.2  5.7    Total Bilirubin 0.3 - 1.2 mg/dL 0.7  0.6    Alkaline Phos 38 - 126 U/L 70  90    AST 15 - 41 U/L 29  30    ALT 0 - 44 U/L 17  19     MR R Foot  1. Bone marrow edema of the proximal, middle and distal phalanges of the third and fourth digits consistent with osteomyelitis. 2. Marked subcutaneous soft tissue edema and inflammatory changes most prominent of the third and fourth digits consistent with cellulitis. No fluid collection or abscess. 3. Increased intramuscular signal of the plantar muscles suggesting diabetic myopathy/myositis. 4. Marked degenerative changes of the first metatarsophalangeal joints with hallux valgus deformity.    Rande Brunt. Lenell Antu, MD FACS [facility] Phone Number: [phone] 03/02/2023 8:30 AM   Total time spent on preparing this encounter including chart review, data review, collecting history, examining the patient, coordinating care for this new patient, 60 minutes.  Portions of this report may have been transcribed using voice recognition software.  Every effort has been made to ensure accuracy; however, inadvertent computerized transcription errors may still be present.

## 2023-03-02 NOTE — Progress Notes (Signed)
PROGRESS NOTE    [name]  WUJ:[id number] DOB: December 14, 1932 DOA: 03/01/2023 PCP: Assunta Found, MD   Brief Narrative: This 89 yrs old female with medical history significant for COPD, acquired hypothyroidism, severe aortic stenosis status post TAVR in August 2023, anemia of chronic disease associated baseline hemoglobin range 10-12, who is admitted  with osteomyelitis of the right foot after presenting from home to [facility] ED complaining of Right foot pain. Imaging and additional notable ED work-up: Plain film of the right foot showed interval development of cortical disruption and resorption about the head of the third toe proximal phalanx along with diffuse soft tissue swelling of the foot and a soft tissue ulcer about the dorsum of the foot.  MRI of the right foot findings consistent with osteomyelitis.  Orthopedics is consulted.  Vascular surgery is consulted.  Patient is not a vascular candidate.  Assessment & Plan:   Principal Problem:   Osteomyelitis of toe of right foot (HCC) Active Problems:   Acquired hypothyroidism   Depression   Hypokalemia   Dehydration   Acute prerenal azotemia   History of COPD   Urinary incontinence   Anemia of chronic disease   PVD (peripheral vascular disease) (HCC)  Osteomyelitis of right foot: Patient presented with 1 week history of progressive erythema , pain and swelling. X-ray right foot suggestive of osteomyelitis involving the head of third toe proximal phalanx. Patient has elevated CRP and ESR. Patient has chronic ulcer on the dorsal surface of right foot. Patient does not appear septic. Follow blood cultures. Initiated on IV vancomycin and Zosyn. Orthopedics Dr. Steward Drone consulted, Advised ABI to check pressures. Vascular surgery consult appreciated.  Patient is not a revascularization candidate. If on ABI if her absolute toe pressure greater than 60 would recommend foot amputation, if absolute toe pressure less than 60 would need a  major amputation llike below knee amputation.  Palliative care consulted.  Hypokalemia: Replaced.  Continue to monitor  Dehydration: Continue IV fluid resuscitation.  COPD: Continue albuterol nebulization. Does not appear in acute exacerbation   Major depression : Continue Paxil.  Hypothyroidism: Continue Synthroid.  Anemia of chronic disease H&H remains stable, no obvious visible bleeding   DVT prophylaxis: SCDS Code Status: Full code Family Communication: No family at bed side. Disposition Plan:  Status is: Inpatient Remains inpatient appropriate because: Admitted for right foot osteomyelitis, Podiatry and Vascular surgery consulted.     Consultants:  Podiatry Vascular surgery  Procedures:   Antimicrobials:  Anti-infectives (From admission, onward)    Start     Dose/Rate Route Frequency Ordered Stop   03/02/23 2115  vancomycin (VANCOCIN) IVPB 1000 mg/200 mL premix        1,000 mg 200 mL/hr over 60 Minutes Intravenous Every 24 hours 03/01/23 2107     03/02/23 0600  piperacillin-tazobactam (ZOSYN) IVPB 3.375 g        3.375 g 12.5 mL/hr over 240 Minutes Intravenous Every 8 hours 03/01/23 2107     03/01/23 2100  vancomycin (VANCOREADY) IVPB 1750 mg/350 mL        1,750 mg 175 mL/hr over 120 Minutes Intravenous  Once 03/01/23 2053 03/02/23 0233   03/01/23 2100  piperacillin-tazobactam (ZOSYN) IVPB 3.375 g        3.375 g 100 mL/hr over 30 Minutes Intravenous  Once 03/01/23 2053 03/01/23 2228      Subjective: Patient was seen and examined at bedside.  Overnight events noted. Patient both legs were swollen erythematous.  She is alert and  oriented.  She is scheduled to have OR intervention today.  Objective: Vitals:   03/01/23 2350 03/02/23 0600 03/02/23 0734 03/02/23 1336  BP: (!) 146/60 102/82 (!) 121/40 122/65  Pulse: 100  97 90  Resp: 18  16 19   Temp: 98.6 F (37 C) 98.6 F (37 C) 98 F (36.7 C) 99.1 F (37.3 C)  TempSrc: Oral Oral  Oral  SpO2: 97%  99% (!) 89% (!) 88%  Weight:        Intake/Output Summary (Last 24 hours) at 03/02/2023 1742 Last data filed at 03/02/2023 1548 Gross per 24 hour  Intake 73.72 ml  Output 1400 ml  Net -1326.28 ml   Filed Weights   03/01/23 2100  Weight: 83 kg    Examination:  General exam: Appears calm and comfortable, deconditioned, not in any distress Respiratory system: Clear to auscultation. Respiratory effort normal.  RR 15 Cardiovascular system: S1 & S2 heard, RRR. No JVD, murmurs, rubs, gallops or clicks. No pedal edema. Gastrointestinal system: Abdomen is nondistended, soft and nontender. No organomegaly or masses felt. Normal bowel sounds heard. Central nervous system: Alert and oriented. No focal neurological deficits. Extremities: Right third toe unstageable ulceration, right first MTP plantar ulceration, erythema bilateral feet and ankles. Skin: No rashes, lesions or ulcers Psychiatry: Judgement and insight appear normal. Mood & affect appropriate.     Data Reviewed: I have personally reviewed following labs and imaging studies  CBC: Recent Labs  Lab 03/01/23 1357 03/02/23 0242  WBC 7.4 8.0  NEUTROABS 5.0 6.1  HGB 11.0* 10.2*  HCT 35.8* 32.5*  MCV 89.7 88.3  PLT 240 235   Basic Metabolic Panel: Recent Labs  Lab 03/01/23 1357 03/01/23 1943 03/02/23 0242  NA 140  --  140  K 2.9*  --  3.5  CL 100  --  102  CO2 29  --  26  GLUCOSE 96  --  87  BUN 24*  --  16  CREATININE 1.05*  --  0.86  CALCIUM 8.4*  --  7.8*  MG  --  2.0 1.8   GFR: Estimated Creatinine Clearance: 52 mL/min (by C-G formula based on SCr of 0.86 mg/dL). Liver Function Tests: Recent Labs  Lab 03/01/23 1357 03/02/23 0242  AST 30 29  ALT 19 17  ALKPHOS 90 70  BILITOT 0.6 0.7  PROT 5.7* 5.2*  ALBUMIN 2.8* 2.5*   No results for input(s): "LIPASE", "AMYLASE" in the last 168 hours. No results for input(s): "AMMONIA" in the last 168 hours. Coagulation Profile: Recent Labs  Lab 03/02/23 0242   INR 1.0   Cardiac Enzymes: No results for input(s): "CKTOTAL", "CKMB", "CKMBINDEX", "TROPONINI" in the last 168 hours. BNP (last 3 results) No results for input(s): "PROBNP" in the last 8760 hours. HbA1C: No results for input(s): "HGBA1C" in the last 72 hours. CBG: No results for input(s): "GLUCAP" in the last 168 hours. Lipid Profile: No results for input(s): "CHOL", "HDL", "LDLCALC", "TRIG", "CHOLHDL", "LDLDIRECT" in the last 72 hours. Thyroid Function Tests: No results for input(s): "TSH", "T4TOTAL", "FREET4", "T3FREE", "THYROIDAB" in the last 72 hours. Anemia Panel: No results for input(s): "VITAMINB12", "FOLATE", "FERRITIN", "TIBC", "IRON", "RETICCTPCT" in the last 72 hours. Sepsis Labs: No results for input(s): "PROCALCITON", "LATICACIDVEN" in the last 168 hours.  Recent Results (from the past 240 hour(s))  Blood culture (routine x 2)     Status: None (Preliminary result)   Collection Time: 03/01/23  8:41 PM   Specimen: BLOOD  Result Value Ref Range  Status   Specimen Description BLOOD SITE NOT SPECIFIED  Final   Special Requests   Final    BOTTLES DRAWN AEROBIC AND ANAEROBIC Blood Culture adequate volume   Culture   Final    NO GROWTH < 12 HOURS Performed at [facility], [address], [place], [place] [address]    Report Status PENDING  Incomplete  Blood culture (routine x 2)     Status: None (Preliminary result)   Collection Time: 03/01/23  9:12 PM   Specimen: BLOOD LEFT HAND  Result Value Ref Range Status   Specimen Description BLOOD LEFT HAND  Final   Special Requests   Final    BOTTLES DRAWN AEROBIC AND ANAEROBIC Blood Culture adequate volume   Culture   Final    NO GROWTH < 24 HOURS Performed at [facility], [address] [place], [place] [address]    Report Status PENDING  Incomplete     Radiology Studies: VAS [place] ABI WITH/WO TBI  Result Date: 03/02/2023  LOWER EXTREMITY DOPPLER STUDY Patient Name:  DEVORIA SCHEIER  Date of Exam:    03/02/2023 Medical Rec #: [id number]            Accession #:    [id number] Date of Birth: [birth year]            Patient Gender: F Patient Age:   24 years Exam Location:  [facility] Procedure:      VAS [place] ABI WITH/WO TBI Referring Phys: MARCUS DUDA --------------------------------------------------------------------------------  Indications: PVD with gangrene right lower extremity. High Risk Factors: Hypertension, coronary artery disease.  Limitations: Today's exam was limited due to patient positioning. Comparison Study: No previous study. Performing Technologist: McKayla Maag RVT, [place]  Examination Guidelines: A complete evaluation includes at minimum, Doppler waveform signals and systolic blood pressure reading at the level of bilateral brachial, anterior tibial, and posterior tibial arteries, when vessel segments are accessible. Bilateral testing is considered an integral part of a complete examination. Photoelectric Plethysmograph (PPG) waveforms and toe systolic pressure readings are included as required and additional duplex testing as needed. Limited examinations for reoccurring indications may be performed as noted.  ABI Findings: +---------+------------------+-----+----------+--------------------------------+ Right    Rt Pressure (mmHg)IndexWaveform  Comment                          +---------+------------------+-----+----------+--------------------------------+ Brachial                        triphasic No pressure obtained due to IV                                             placement.                       +---------+------------------+-----+----------+--------------------------------+ PTA                                       Not obtained due to patient  positioning                      +---------+------------------+-----+----------+--------------------------------+ DP       59                0.49 monophasic                                  +---------+------------------+-----+----------+--------------------------------+ Great Toe29                0.24 Abnormal                                   +---------+------------------+-----+----------+--------------------------------+ +---------+------------------+-----+----------+-------+ Left     Lt Pressure (mmHg)IndexWaveform  Comment +---------+------------------+-----+----------+-------+ Brachial 121                    biphasic          +---------+------------------+-----+----------+-------+ PTA      44                0.36 monophasic        +---------+------------------+-----+----------+-------+ DP       33                0.27 monophasic        +---------+------------------+-----+----------+-------+ Great Toe20                0.17 Abnormal          +---------+------------------+-----+----------+-------+ +-------+-----------+-----------+------------+------------+ ABI/TBIToday's ABIToday's TBIPrevious ABIPrevious TBI +-------+-----------+-----------+------------+------------+ Right  0.49       0.24                                +-------+-----------+-----------+------------+------------+ Left   0.36       0.17                                +-------+-----------+-----------+------------+------------+   Summary: Right: Resting right ankle-brachial index indicates severe right lower extremity arterial disease. The right toe-brachial index is abnormal. Left: Resting left ankle-brachial index indicates severe left lower extremity arterial disease. The left toe-brachial index is abnormal. *See table(s) above for measurements and observations.     Preliminary    MR FOOT RIGHT W WO CONTRAST  Result Date: 03/01/2023 CLINICAL DATA:  Soft tissue infection suspected. Multiple wounds along the forefoot and big toe. EXAM: MRI OF THE RIGHT FOREFOOT WITHOUT AND WITH CONTRAST TECHNIQUE: Multiplanar, multisequence MR imaging of the right forefoot was  performed before and after the administration of intravenous contrast. CONTRAST:  8mL GADAVIST GADOBUTROL 1 MMOL/ML IV SOLN COMPARISON:  Radiograph performed earlier on the same date. FINDINGS: Bones/Joint/Cartilage There is bone marrow edema of the proximal middle and distal phalanges of the third and fourth digits suggesting osteomyelitis. Marrow signal within remaining osseous structures is within normal limits. Marked degenerative changes of the first metatarsophalangeal joints with hallux valgus deformity. Ligaments Lisfranc ligament is intact. Muscles and Tendons Increased intramuscular signal of the plantar muscles suggesting diabetic myopathy/myositis. Soft tissues Marked subcutaneous soft tissue edema and inflammatory changes most prominent of the third and fourth digits. Generalized subcutaneous soft tissue edema about the foot. No fluid collection or abscess. IMPRESSION: 1. Bone marrow edema of the proximal, middle and distal phalanges of the  third and fourth digits consistent with osteomyelitis. 2. Marked subcutaneous soft tissue edema and inflammatory changes most prominent of the third and fourth digits consistent with cellulitis. No fluid collection or abscess. 3. Increased intramuscular signal of the plantar muscles suggesting diabetic myopathy/myositis. 4. Marked degenerative changes of the first metatarsophalangeal joints with hallux valgus deformity. Electronically Signed   By: Larose Hires D.O.   On: 03/01/2023 22:54   DG Foot 2 Views Right  Result Date: 03/01/2023 CLINICAL DATA:  Foot wound on bottom of right foot EXAM: RIGHT FOOT - 2 VIEW COMPARISON:  Radiographs 01/06/2023 FINDINGS: Demineralization. There is new cortical disruption and resorption about the head of the third toe proximal phalanx. The third toe mid and distal phalanx are not well visualized. Dislocation of the middle phalanx of the fourth toe in relation to the proximal phalanx. Plantar calcaneal spur. Vascular  calcifications. Diffuse soft tissue swelling about the foot. Soft tissue ulcer about the dorsum of the foot at the MTP joint on lateral view. IMPRESSION: 1. Third toe osteomyelitis. 2. Dislocation of the right fourth toe middle phalanx. 3. Diffuse soft tissue swelling about the foot. Electronically Signed   By: Minerva Fester M.D.   On: 03/01/2023 19:58    Scheduled Meds: Continuous Infusions:  piperacillin-tazobactam (ZOSYN)  IV 3.375 g (03/02/23 1354)   vancomycin       LOS: 1 day    Time spent: 50 mins    Willeen Niece, MD [facility]   If 7PM-7AM, please contact night-coverage

## 2023-03-02 NOTE — Progress Notes (Signed)
ABI study completed.   Please see CV Procedures for preliminary results.  Varina Hulon, RVT  11:45 AM 03/02/23

## 2023-03-03 ENCOUNTER — Telehealth: Payer: Self-pay | Admitting: Orthopedic Surgery

## 2023-03-03 ENCOUNTER — Telehealth: Payer: Self-pay

## 2023-03-03 DIAGNOSIS — M869 Osteomyelitis, unspecified: Secondary | ICD-10-CM | POA: Diagnosis not present

## 2023-03-03 DIAGNOSIS — L97519 Non-pressure chronic ulcer of other part of right foot with unspecified severity: Secondary | ICD-10-CM

## 2023-03-03 MED ORDER — FUROSEMIDE 40 MG PO TABS
40.0000 mg | ORAL_TABLET | Freq: Every day | ORAL | Status: DC
  Administered 2023-03-03 – 2023-03-09 (×7): 40 mg via ORAL
  Filled 2023-03-03 (×7): qty 1

## 2023-03-03 MED ORDER — PAROXETINE HCL 20 MG PO TABS
20.0000 mg | ORAL_TABLET | Freq: Every day | ORAL | Status: DC
  Administered 2023-03-03 – 2023-03-09 (×7): 20 mg via ORAL
  Filled 2023-03-03 (×7): qty 1

## 2023-03-03 MED ORDER — OXYBUTYNIN CHLORIDE ER 5 MG PO TB24
15.0000 mg | ORAL_TABLET | Freq: Every day | ORAL | Status: DC
  Administered 2023-03-03 – 2023-03-09 (×7): 15 mg via ORAL
  Filled 2023-03-03 (×7): qty 1

## 2023-03-03 MED ORDER — LEVOTHYROXINE SODIUM 75 MCG PO TABS
150.0000 ug | ORAL_TABLET | Freq: Every day | ORAL | Status: DC
  Administered 2023-03-04 – 2023-03-09 (×6): 150 ug via ORAL
  Filled 2023-03-03 (×6): qty 2

## 2023-03-03 NOTE — Telephone Encounter (Signed)
Patient is currently in the hospital and Lajoyce Corners has been helping take care of her, the daughter Aerial Plunk is wanting some information about what is going on it is some surprising information in the last set of notes that needs attention, she would like a call from [place] when he can please advise

## 2023-03-03 NOTE — Progress Notes (Signed)
PROGRESS NOTE    [name]  GGY:[id number] DOB: [birth year] DOA: 03/01/2023 PCP: Assunta Found, MD   Brief Narrative: This 89 yrs old female with medical history significant for COPD, acquired hypothyroidism, severe aortic stenosis status post TAVR in August 2023, anemia of chronic disease associated baseline hemoglobin range 10-12, who is admitted  with osteomyelitis of the right foot after presenting from home to [facility] ED complaining of Right foot pain. Imaging and additional notable ED work-up: Plain film of the right foot showed interval development of cortical disruption and resorption about the head of the third toe proximal phalanx along with diffuse soft tissue swelling of the foot and a soft tissue ulcer about the dorsum of the foot.  MRI of the right foot findings consistent with osteomyelitis.  Orthopedics is consulted.  Vascular surgery is consulted.  Patient is not a vascular candidate.  Orthopedics recommended safest option would be to discharge patient to skilled nursing facility and wound care daily to her feet.  If the wound gets worse we could proceed with amputation.  Assessment & Plan:   Principal Problem:   Osteomyelitis of toe of right foot (HCC) Active Problems:   Acquired hypothyroidism   Depression   Hypokalemia   Dehydration   Acute prerenal azotemia   History of COPD   Urinary incontinence   Anemia of chronic disease   PVD (peripheral vascular disease) (HCC)  Osteomyelitis of right foot: Patient presented with 1 week history of progressive erythema , pain and swelling. X-ray right foot suggestive of osteomyelitis involving the head of third toe proximal phalanx. Patient has elevated CRP and ESR. Patient has chronic ulcer on the dorsal surface of right foot. Patient does not appear septic. Blood cultures > No growth so far Initiated on IV vancomycin and Zosyn. Orthopedics Dr. Steward Drone consulted, Advised ABI to check pressures. Vascular surgery consult  appreciated.  Patient is not a revascularization candidate. If on ABI if her absolute toe pressure greater than 60 would recommend foot amputation, if absolute toe pressure less than 60 would need a major amputation llike below knee amputation.   Orthopedics recommended safest option would be to discharge patient to skilled nursing facility and wound care daily to her feet.  If the wound gets worse we could proceed with amputation.  Hypokalemia: Replaced.  Continue to monitor  Dehydration: Continue IV fluid resuscitation.  COPD: Continue albuterol nebulization. Does not appear in acute exacerbation.  Major depression : Continue Paxil.  Hypothyroidism: Continue Synthroid.  Anemia of chronic disease H&H remains stable, no obvious visible bleeding.   DVT prophylaxis: SCDS Code Status: Full code Family Communication: No family at bed side. Disposition Plan:  Status is: Inpatient Remains inpatient appropriate because: Admitted for right foot osteomyelitis, Podiatry and Vascular surgery consulted. Orthopedics recommended safest option would be to discharge patient to skilled nursing facility and wound care daily to her feet.  If the wound gets worse we could proceed with amputation.   Consultants:  Podiatry Vascular surgery  Procedures:   Antimicrobials:  Anti-infectives (From admission, onward)    Start     Dose/Rate Route Frequency Ordered Stop   03/02/23 2115  vancomycin (VANCOCIN) IVPB 1000 mg/200 mL premix        1,000 mg 200 mL/hr over 60 Minutes Intravenous Every 24 hours 03/01/23 2107     03/02/23 0600  piperacillin-tazobactam (ZOSYN) IVPB 3.375 g        3.375 g 12.5 mL/hr over 240 Minutes Intravenous Every 8 hours 03/01/23 2107  03/01/23 2100  vancomycin (VANCOREADY) IVPB 1750 mg/350 mL        1,750 mg 175 mL/hr over 120 Minutes Intravenous  Once 03/01/23 2053 03/02/23 0233   03/01/23 2100  piperacillin-tazobactam (ZOSYN) IVPB 3.375 g        3.375 g 100  mL/hr over 30 Minutes Intravenous  Once 03/01/23 2053 03/01/23 2228      Subjective: Patient was seen and examined at bedside.  Overnight events noted. Patient both legs were swollen and erythematous.  She is alert and oriented following commands. Orthopedic recommended no need for amputation at this time.  Objective: Vitals:   03/02/23 1336 03/02/23 2010 03/03/23 0444 03/03/23 0758  BP: 122/65 (!) 161/56 (!) 148/72 (!) 140/55  Pulse: 90 91 96 94  Resp: 19 16 18 17   Temp: 99.1 F (37.3 C) 98.8 F (37.1 C) 98.7 F (37.1 C) 98.3 F (36.8 C)  TempSrc: Oral  Oral Oral  SpO2: (!) 88% 92% 93% 95%  Weight:        Intake/Output Summary (Last 24 hours) at 03/03/2023 1331 Last data filed at 03/02/2023 1700 Gross per 24 hour  Intake 73.72 ml  Output --  Net 73.72 ml   Filed Weights   03/01/23 2100  Weight: 83 kg    Examination:  General exam: Appears comfortable, deconditioned, not in any acute distress. Respiratory system: CTA bilaterally . Respiratory effort normal.  RR 14 Cardiovascular system: S1 & S2 heard, RRR. No JVD, murmurs, rubs, gallops or clicks. No pedal edema. Gastrointestinal system: Abdomen is nondistended, soft and nontender. No organomegaly or masses felt. Normal bowel sounds heard. Central nervous system: Alert and oriented. No focal neurological deficits. Extremities: Right third toe unstageable ulceration, right first MTP plantar ulceration, erythema bilateral feet and ankles. Skin: No rashes, lesions or ulcers Psychiatry: Judgement and insight appear normal. Mood & affect appropriate.     Data Reviewed: I have personally reviewed following labs and imaging studies  CBC: Recent Labs  Lab 03/01/23 1357 03/02/23 0242  WBC 7.4 8.0  NEUTROABS 5.0 6.1  HGB 11.0* 10.2*  HCT 35.8* 32.5*  MCV 89.7 88.3  PLT 240 235   Basic Metabolic Panel: Recent Labs  Lab 03/01/23 1357 03/01/23 1943 03/02/23 0242  NA 140  --  140  K 2.9*  --  3.5  CL 100  --  102   CO2 29  --  26  GLUCOSE 96  --  87  BUN 24*  --  16  CREATININE 1.05*  --  0.86  CALCIUM 8.4*  --  7.8*  MG  --  2.0 1.8   GFR: Estimated Creatinine Clearance: 52 mL/min (by C-G formula based on SCr of 0.86 mg/dL). Liver Function Tests: Recent Labs  Lab 03/01/23 1357 03/02/23 0242  AST 30 29  ALT 19 17  ALKPHOS 90 70  BILITOT 0.6 0.7  PROT 5.7* 5.2*  ALBUMIN 2.8* 2.5*   No results for input(s): "LIPASE", "AMYLASE" in the last 168 hours. No results for input(s): "AMMONIA" in the last 168 hours. Coagulation Profile: Recent Labs  Lab 03/02/23 0242  INR 1.0   Cardiac Enzymes: No results for input(s): "CKTOTAL", "CKMB", "CKMBINDEX", "TROPONINI" in the last 168 hours. BNP (last 3 results) No results for input(s): "PROBNP" in the last 8760 hours. HbA1C: No results for input(s): "HGBA1C" in the last 72 hours. CBG: No results for input(s): "GLUCAP" in the last 168 hours. Lipid Profile: No results for input(s): "CHOL", "HDL", "LDLCALC", "TRIG", "CHOLHDL", "LDLDIRECT" in  the last 72 hours. Thyroid Function Tests: No results for input(s): "TSH", "T4TOTAL", "FREET4", "T3FREE", "THYROIDAB" in the last 72 hours. Anemia Panel: No results for input(s): "VITAMINB12", "FOLATE", "FERRITIN", "TIBC", "IRON", "RETICCTPCT" in the last 72 hours. Sepsis Labs: No results for input(s): "PROCALCITON", "LATICACIDVEN" in the last 168 hours.  Recent Results (from the past 240 hour(s))  Blood culture (routine x 2)     Status: None (Preliminary result)   Collection Time: 03/01/23  8:41 PM   Specimen: BLOOD  Result Value Ref Range Status   Specimen Description BLOOD SITE NOT SPECIFIED  Final   Special Requests   Final    BOTTLES DRAWN AEROBIC AND ANAEROBIC Blood Culture adequate volume   Culture   Final    NO GROWTH 2 DAYS Performed at [facility], [address], [place], [place] [address]    Report Status PENDING  Incomplete  Blood culture (routine x 2)     Status: None  (Preliminary result)   Collection Time: 03/01/23  9:12 PM   Specimen: BLOOD LEFT HAND  Result Value Ref Range Status   Specimen Description BLOOD LEFT HAND  Final   Special Requests   Final    BOTTLES DRAWN AEROBIC AND ANAEROBIC Blood Culture adequate volume   Culture   Final    NO GROWTH 2 DAYS Performed at [facility], [address], [place], [place] [address]    Report Status PENDING  Incomplete     Radiology Studies: VAS [place] ABI WITH/WO TBI  Result Date: 03/02/2023  LOWER EXTREMITY DOPPLER STUDY Patient Name:  JAYDYN KAEO  Date of Exam:   03/02/2023 Medical Rec #: [id number]            Accession #:    [id number] Date of Birth: [birth year]            Patient Gender: F Patient Age:   34 years Exam Location:  [facility] Procedure:      VAS [place] ABI WITH/WO TBI Referring Phys: MARCUS DUDA --------------------------------------------------------------------------------  Indications: PVD with gangrene right lower extremity. High Risk Factors: Hypertension, coronary artery disease.  Limitations: Today's exam was limited due to patient positioning. Comparison Study: No previous study. Performing Technologist: McKayla Maag RVT, [place]  Examination Guidelines: A complete evaluation includes at minimum, Doppler waveform signals and systolic blood pressure reading at the level of bilateral brachial, anterior tibial, and posterior tibial arteries, when vessel segments are accessible. Bilateral testing is considered an integral part of a complete examination. Photoelectric Plethysmograph (PPG) waveforms and toe systolic pressure readings are included as required and additional duplex testing as needed. Limited examinations for reoccurring indications may be performed as noted.  ABI Findings: +---------+------------------+-----+----------+--------------------------------+ Right    Rt Pressure (mmHg)IndexWaveform  Comment                           +---------+------------------+-----+----------+--------------------------------+ Brachial                        triphasic No pressure obtained due to IV                                             placement.                       +---------+------------------+-----+----------+--------------------------------+  PTA                                       Not obtained due to patient                                                positioning                      +---------+------------------+-----+----------+--------------------------------+ DP       59                0.49 monophasic                                 +---------+------------------+-----+----------+--------------------------------+ Great Toe29                0.24 Abnormal                                   +---------+------------------+-----+----------+--------------------------------+ +---------+------------------+-----+----------+-------+ Left     Lt Pressure (mmHg)IndexWaveform  Comment +---------+------------------+-----+----------+-------+ Brachial 121                    biphasic          +---------+------------------+-----+----------+-------+ PTA      44                0.36 monophasic        +---------+------------------+-----+----------+-------+ DP       33                0.27 monophasic        +---------+------------------+-----+----------+-------+ Great Toe20                0.17 Abnormal          +---------+------------------+-----+----------+-------+ +-------+-----------+-----------+------------+------------+ ABI/TBIToday's ABIToday's TBIPrevious ABIPrevious TBI +-------+-----------+-----------+------------+------------+ Right  0.49       0.24                                +-------+-----------+-----------+------------+------------+ Left   0.36       0.17                                +-------+-----------+-----------+------------+------------+   Summary: Right:  Resting right ankle-brachial index indicates severe right lower extremity arterial disease. The right toe-brachial index is abnormal. Left: Resting left ankle-brachial index indicates severe left lower extremity arterial disease. The left toe-brachial index is abnormal. *See table(s) above for measurements and observations.  Electronically signed by Heath Lark on 03/02/2023 at 6:09:29 PM.    Final    MR FOOT RIGHT W WO CONTRAST  Result Date: 03/01/2023 CLINICAL DATA:  Soft tissue infection suspected. Multiple wounds along the forefoot and big toe. EXAM: MRI OF THE RIGHT FOREFOOT WITHOUT AND WITH CONTRAST TECHNIQUE: Multiplanar, multisequence MR imaging of the right forefoot was performed before and after the administration of intravenous contrast. CONTRAST:  8mL GADAVIST GADOBUTROL 1 MMOL/ML IV SOLN COMPARISON:  Radiograph performed earlier on the same date. FINDINGS: Bones/Joint/Cartilage There is bone marrow edema  of the proximal middle and distal phalanges of the third and fourth digits suggesting osteomyelitis. Marrow signal within remaining osseous structures is within normal limits. Marked degenerative changes of the first metatarsophalangeal joints with hallux valgus deformity. Ligaments Lisfranc ligament is intact. Muscles and Tendons Increased intramuscular signal of the plantar muscles suggesting diabetic myopathy/myositis. Soft tissues Marked subcutaneous soft tissue edema and inflammatory changes most prominent of the third and fourth digits. Generalized subcutaneous soft tissue edema about the foot. No fluid collection or abscess. IMPRESSION: 1. Bone marrow edema of the proximal, middle and distal phalanges of the third and fourth digits consistent with osteomyelitis. 2. Marked subcutaneous soft tissue edema and inflammatory changes most prominent of the third and fourth digits consistent with cellulitis. No fluid collection or abscess. 3. Increased intramuscular signal of the plantar muscles  suggesting diabetic myopathy/myositis. 4. Marked degenerative changes of the first metatarsophalangeal joints with hallux valgus deformity. Electronically Signed   By: Larose Hires D.O.   On: 03/01/2023 22:54   DG Foot 2 Views Right  Result Date: 03/01/2023 CLINICAL DATA:  Foot wound on bottom of right foot EXAM: RIGHT FOOT - 2 VIEW COMPARISON:  Radiographs 01/06/2023 FINDINGS: Demineralization. There is new cortical disruption and resorption about the head of the third toe proximal phalanx. The third toe mid and distal phalanx are not well visualized. Dislocation of the middle phalanx of the fourth toe in relation to the proximal phalanx. Plantar calcaneal spur. Vascular calcifications. Diffuse soft tissue swelling about the foot. Soft tissue ulcer about the dorsum of the foot at the MTP joint on lateral view. IMPRESSION: 1. Third toe osteomyelitis. 2. Dislocation of the right fourth toe middle phalanx. 3. Diffuse soft tissue swelling about the foot. Electronically Signed   By: Minerva Fester M.D.   On: 03/01/2023 19:58    Scheduled Meds:  furosemide  40 mg Oral Daily   [START ON 03/04/2023] levothyroxine  150 mcg Oral QAC breakfast   oxybutynin  15 mg Oral Daily   PARoxetine  20 mg Oral Daily   Continuous Infusions:  piperacillin-tazobactam (ZOSYN)  IV 3.375 g (03/03/23 1326)   vancomycin 1,000 mg (03/02/23 2102)     LOS: 2 days    Time spent: 35 mins    Willeen Niece, MD [facility]   If 7PM-7AM, please contact night-coverage

## 2023-03-03 NOTE — Telephone Encounter (Signed)
Please see message below. You did a consult on this pt yesterday and her daughter [name] would like to speak with you. (She is listed on DPR)

## 2023-03-03 NOTE — Plan of Care (Signed)
  Problem: Clinical Measurements: Goal: Ability to maintain clinical measurements within normal limits will improve Outcome: Progressing Goal: Will remain free from infection Outcome: Progressing Goal: Respiratory complications will improve Outcome: Progressing   Problem: Pain Managment: Goal: General experience of comfort will improve Outcome: Progressing   Problem: Safety: Goal: Ability to remain free from injury will improve Outcome: Progressing   Problem: Skin Integrity: Goal: Risk for impaired skin integrity will decrease Outcome: Progressing   

## 2023-03-03 NOTE — Telephone Encounter (Signed)
Pt's daughter called with questions after pt was seen by on call MD. Pt is currently admitted. It sounded like she also called Dr. Audrie Lia office and they were able to assist her. I have advised her to speak with the pt's nurse, as we are in the office. She verbalized understanding.

## 2023-03-03 NOTE — Progress Notes (Signed)
Patient ID: [name], female   DOB: Dec 18, 1932, 89 y.o.   MRN: [id number] I spoke with patient's daughter [name] regarding proposed treatment plan.  Patient's daughter states the patient has been independent at home ambulating with a wheelchair for the past 13 years.  They have been unable to obtain home health dressing changes.  I recommended from my standpoint the safest option would be to discharge to skilled nursing so wound care can be performed daily to her feet, and she could receive therapy as well.  Discussed that if the wound gets worse we could proceed with an amputation but discussed that I do not feel a amputation is necessary at this time.  Patient has had a bad experience at a rehab facility after her total knee replacement.  Patient's daughter [name] will need to be involved with placement planning.  I will be available as needed.

## 2023-03-03 NOTE — Consult Note (Signed)
WOC Nurse Consult Note: Reason for Consult: Right foot with nonhealing arterial wounds to great toe head, fourth metatarsal.  Left foot with nonintact lesions, (calloused) to plantar and medial aspect of foot near great toe head.  Orthopedic service and vascular have consulted and recommend topical wound care with skilled nursing at discharge.  WOC team asked to evaluate and recommend topical therapy for maintenance of wounds. Wounds not likely to heal with current perfusion. Wound type:vascular Pressure Injury POA:NA Measurement: Bedside RN to obtain and place in chart.  Photos in chart Wound bed: necrotic wound to right great toe head, medial aspect and right fourth toe.  Left great toe head, medial aspect and left plantar foot. Left third and fourth toes dark and necrotic in appearance.  Drainage (amount, consistency, odor) none noted. Periwound: Severe vascular disease to bilateral lower legs Dressing procedure/placement/frequency: Cleanse wounds to left and right feet and toes with NS and pat dry.  Cut aquael in a thin strip and weave between toes and over nonintact eschar. [facility] # [id number]) Place aquacel into wounds on bilateral great toes.  Cover all with dry gauze and wrap with kerlix and tape.  Change every other day. Please date dressing.  Will not follow at this time.  Please re-consult if needed.  Mike Gip MSN, RN, FNP-BC CWON Wound, Ostomy, Continence Nurse Outpatient [facility] [phone] Pager [phone]

## 2023-03-03 NOTE — Progress Notes (Addendum)
  Progress Note    03/03/2023 7:41 AM Hospital Day 2  Subjective:  no complaints;  she tells me she has a son that had bilateral leg amputations.   afebrile  Vitals:   03/02/23 2010 03/03/23 0444  BP: (!) 161/56 (!) 148/72  Pulse: 91 96  Resp: 16 18  Temp: 98.8 F (37.1 C) 98.7 F (37.1 C)  SpO2: 92% 93%    Physical Exam: General:  no distress Lungs:  non labored   CBC    Component Value Date/Time   WBC 8.0 03/02/2023 0242   RBC 3.68 (L) 03/02/2023 0242   HGB 10.2 (L) 03/02/2023 0242   HGB 13.4 06/23/2020 1500   HCT 32.5 (L) 03/02/2023 0242   HCT 41.7 06/23/2020 1500   PLT 235 03/02/2023 0242   PLT 282 06/23/2020 1500   MCV 88.3 03/02/2023 0242   MCV 86 06/23/2020 1500   MCH 27.7 03/02/2023 0242   MCHC 31.4 03/02/2023 0242   RDW 15.3 03/02/2023 0242   RDW 13.4 06/23/2020 1500   LYMPHSABS 1.4 03/02/2023 0242   MONOABS 0.4 03/02/2023 0242   EOSABS 0.1 03/02/2023 0242   BASOSABS 0.0 03/02/2023 0242    BMET    Component Value Date/Time   NA 140 03/02/2023 0242   NA 144 06/30/2022 1650   K 3.5 03/02/2023 0242   CL 102 03/02/2023 0242   CO2 26 03/02/2023 0242   GLUCOSE 87 03/02/2023 0242   BUN 16 03/02/2023 0242   BUN 17 06/30/2022 1650   CREATININE 0.86 03/02/2023 0242   CALCIUM 7.8 (L) 03/02/2023 0242   GFRNONAA >60 03/02/2023 0242   GFRAA 87 06/23/2020 1500    INR    Component Value Date/Time   INR 1.0 03/02/2023 0242     Intake/Output Summary (Last 24 hours) at 03/03/2023 0741 Last data filed at 03/02/2023 1700 Gross per 24 hour  Intake 73.72 ml  Output 700 ml  Net -626.28 ml   ABI/TBI 03/02/2023 +-------+-----------+-----------+------------+------------+  ABI/TBIToday's ABIToday's TBIPrevious ABIPrevious TBI  +-------+-----------+-----------+------------+------------+  Right 0.49       0.24                                 +-------+-----------+-----------+------------+------------+  Left  0.36       0.17                                  +-------+-----------+-----------+------------+------------+  Right toe pressure: 29 Left toe pressure:  20   Assessment/Plan:  89 y.o. female with right foot osteomyelitis.  Hospital Day 2  -pt is not a candidate for revascularization.  ABI's are 0.49 and 0.36 right and left respectively.  Her TBI and toe pressures are significantly decreased with right toe pressure of 29 and left toe pressure of 20.   -given these findings, she would not heal a TMA or foot amputation and will need a right AKA.   Discussed with pt and as she is not excited about having an amputation, she understands.  Dr. Lajoyce Corners to discuss more with pt.    Doreatha Massed, PA-C [facility] [phone] 03/03/2023 7:41 AM   VASCULAR STAFF ADDENDUM: I agree with the above.  Please call for any questions.  Rande Brunt. Lenell Antu, MD [facility] Phone Number: [phone] 03/03/2023 8:44 AM

## 2023-03-03 NOTE — Progress Notes (Signed)
Dressings applied to both feet per orders from [facility].

## 2023-03-03 NOTE — Progress Notes (Signed)
Patient ID: [name], female   DOB: 1933/02/21, 89 y.o.   MRN: [id number] Patient is an 89 year old woman with advanced dementia and peripheral vascular disease and ischemic ulcer right first metatarsal head.  She has flexion contractures of her hips and knees.  Review of the ABIs shows severe peripheral vascular disease.  Patient is not a revascularization candidate per vascular surgery Dr. Lenell Antu.  Would recommend Prevalon boots bilaterally.  Patient's surgical option would be a above-the-knee amputation on the right.  Would hold on surgery unless the toe ulceration becomes life-threatening.

## 2023-03-04 DIAGNOSIS — M869 Osteomyelitis, unspecified: Secondary | ICD-10-CM | POA: Diagnosis not present

## 2023-03-04 LAB — CULTURE, BLOOD (ROUTINE X 2): Special Requests: ADEQUATE

## 2023-03-04 NOTE — Plan of Care (Signed)

## 2023-03-04 NOTE — Progress Notes (Signed)
PROGRESS NOTE    [name]  ZOX:[id number] DOB: Apr 24, 1933 DOA: 03/01/2023 PCP: Assunta Found, MD   Brief Narrative: This 89 yrs old female with medical history significant for COPD, acquired hypothyroidism, severe aortic stenosis status post TAVR in August 2023, anemia of chronic disease associated baseline hemoglobin range 10-12, who is admitted  with osteomyelitis of the right foot after presenting from home to [facility] ED complaining of Right foot pain. Imaging and additional notable ED work-up: Plain film of the right foot showed interval development of cortical disruption and resorption about the head of the third toe proximal phalanx along with diffuse soft tissue swelling of the foot and a soft tissue ulcer about the dorsum of the foot.  MRI of the right foot findings consistent with osteomyelitis.  Orthopedics is consulted.  Vascular surgery is consulted.  Patient is not a vascular candidate.  Orthopedics recommended safest option would be to discharge patient to skilled nursing facility and wound care daily to her feet.  If the wound gets worse we could proceed with amputation.  Assessment & Plan:   Principal Problem:   Osteomyelitis of toe of right foot (HCC) Active Problems:   Acquired hypothyroidism   Depression   Hypokalemia   Dehydration   Acute prerenal azotemia   History of COPD   Urinary incontinence   Anemia of chronic disease   PVD (peripheral vascular disease) (HCC)  Osteomyelitis of right foot: Patient presented with 1 week history of progressive erythema , pain and swelling. X-ray right foot suggestive of osteomyelitis involving the head of third toe proximal phalanx. Patient has elevated CRP and ESR. Patient has chronic ulcer on the dorsal surface of right foot. Patient does not appear septic. Blood cultures > No growth so far Initiated on IV vancomycin and Zosyn. Orthopedics Dr. Steward Drone consulted, Advised ABI to check pressures. Vascular surgery consult  appreciated.  Patient is not a revascularization candidate. If on ABI if her absolute toe pressure greater than 60 would recommend foot amputation, if absolute toe pressure less than 60 would need a major amputation llike below knee amputation.   Orthopedics recommended safest option would be to discharge patient to skilled nursing facility and wound care daily to her feet.  If the wound gets worse we could proceed with amputation.  Hypokalemia: Replaced.  Continue to monitor  Dehydration: Continue IV fluid resuscitation.  COPD: Continue albuterol nebulization. Does not appear in acute exacerbation.  Major depression : Continue Paxil.  Hypothyroidism: Continue Synthroid.  Anemia of chronic disease H&H remains stable, no obvious visible bleeding.   DVT prophylaxis: SCDS Code Status: Full code Family Communication: No family at bed side. Disposition Plan:  Status is: Inpatient Remains inpatient appropriate because: Admitted for right foot osteomyelitis, Podiatry and Vascular surgery consulted. Orthopedics recommended safest option would be to discharge patient to skilled nursing facility and wound care daily to her feet.  If the wound gets worse we could proceed with amputation.   Consultants:  Podiatry Vascular surgery  Procedures:   Antimicrobials:  Anti-infectives (From admission, onward)    Start     Dose/Rate Route Frequency Ordered Stop   03/02/23 2115  vancomycin (VANCOCIN) IVPB 1000 mg/200 mL premix        1,000 mg 200 mL/hr over 60 Minutes Intravenous Every 24 hours 03/01/23 2107     03/02/23 0600  piperacillin-tazobactam (ZOSYN) IVPB 3.375 g        3.375 g 12.5 mL/hr over 240 Minutes Intravenous Every 8 hours 03/01/23 2107  03/01/23 2100  vancomycin (VANCOREADY) IVPB 1750 mg/350 mL        1,750 mg 175 mL/hr over 120 Minutes Intravenous  Once 03/01/23 2053 03/02/23 0233   03/01/23 2100  piperacillin-tazobactam (ZOSYN) IVPB 3.375 g        3.375 g 100  mL/hr over 30 Minutes Intravenous  Once 03/01/23 2053 03/01/23 2228      Subjective: Patient was seen and examined at bedside.  Overnight events noted. Both legs were swollen and erythematous. She is alert and oriented and following commands. Orthopedic recommended no need for amputation at this time.  Objective: Vitals:   03/03/23 0444 03/03/23 0758 03/03/23 1431 03/04/23 0731  BP: (!) 148/72 (!) 140/55 100/82 (!) 138/54  Pulse: 96 94 80 94  Resp: 18 17 17 18   Temp: 98.7 F (37.1 C) 98.3 F (36.8 C) 98.5 F (36.9 C) 98.8 F (37.1 C)  TempSrc: Oral Oral Oral Oral  SpO2: 93% 95% 95% 93%  Weight:        Intake/Output Summary (Last 24 hours) at 03/04/2023 1426 Last data filed at 03/04/2023 1026 Gross per 24 hour  Intake 358.3 ml  Output 500 ml  Net -141.7 ml   Filed Weights   03/01/23 2100  Weight: 83 kg    Examination:  General exam: Appears comfortable, deconditioned, not in any acute distress. Respiratory system: CTA bilaterally . Respiratory effort normal.  RR 14 Cardiovascular system: S1 & S2 heard, RRR. No JVD, murmurs, rubs, gallops or clicks. No pedal edema. Gastrointestinal system: Abdomen is soft, non tender, non distended, bowel sounds present. Central nervous system: Alert and oriented x 3 . No focal neurological deficits. Extremities: Right third toe unstageable ulceration, right first MTP plantar ulceration, erythema bilateral feet and ankles. Skin: No rashes, lesions or ulcers Psychiatry: Judgement and insight appear normal. Mood & affect appropriate.     Data Reviewed: I have personally reviewed following labs and imaging studies  CBC: Recent Labs  Lab 03/01/23 1357 03/02/23 0242  WBC 7.4 8.0  NEUTROABS 5.0 6.1  HGB 11.0* 10.2*  HCT 35.8* 32.5*  MCV 89.7 88.3  PLT 240 235   Basic Metabolic Panel: Recent Labs  Lab 03/01/23 1357 03/01/23 1943 03/02/23 0242  NA 140  --  140  K 2.9*  --  3.5  CL 100  --  102  CO2 29  --  26  GLUCOSE 96   --  87  BUN 24*  --  16  CREATININE 1.05*  --  0.86  CALCIUM 8.4*  --  7.8*  MG  --  2.0 1.8   GFR: Estimated Creatinine Clearance: 52 mL/min (by C-G formula based on SCr of 0.86 mg/dL). Liver Function Tests: Recent Labs  Lab 03/01/23 1357 03/02/23 0242  AST 30 29  ALT 19 17  ALKPHOS 90 70  BILITOT 0.6 0.7  PROT 5.7* 5.2*  ALBUMIN 2.8* 2.5*   No results for input(s): "LIPASE", "AMYLASE" in the last 168 hours. No results for input(s): "AMMONIA" in the last 168 hours. Coagulation Profile: Recent Labs  Lab 03/02/23 0242  INR 1.0   Cardiac Enzymes: No results for input(s): "CKTOTAL", "CKMB", "CKMBINDEX", "TROPONINI" in the last 168 hours. BNP (last 3 results) No results for input(s): "PROBNP" in the last 8760 hours. HbA1C: No results for input(s): "HGBA1C" in the last 72 hours. CBG: No results for input(s): "GLUCAP" in the last 168 hours. Lipid Profile: No results for input(s): "CHOL", "HDL", "LDLCALC", "TRIG", "CHOLHDL", "LDLDIRECT" in the last 72  hours. Thyroid Function Tests: No results for input(s): "TSH", "T4TOTAL", "FREET4", "T3FREE", "THYROIDAB" in the last 72 hours. Anemia Panel: No results for input(s): "VITAMINB12", "FOLATE", "FERRITIN", "TIBC", "IRON", "RETICCTPCT" in the last 72 hours. Sepsis Labs: No results for input(s): "PROCALCITON", "LATICACIDVEN" in the last 168 hours.  Recent Results (from the past 240 hour(s))  Blood culture (routine x 2)     Status: None (Preliminary result)   Collection Time: 03/01/23  8:41 PM   Specimen: BLOOD  Result Value Ref Range Status   Specimen Description BLOOD SITE NOT SPECIFIED  Final   Special Requests   Final    BOTTLES DRAWN AEROBIC AND ANAEROBIC Blood Culture adequate volume   Culture   Final    NO GROWTH 3 DAYS Performed at [facility], [address], [place], [place] [address]    Report Status PENDING  Incomplete  Blood culture (routine x 2)     Status: None (Preliminary result)   Collection Time:  03/01/23  9:12 PM   Specimen: BLOOD LEFT HAND  Result Value Ref Range Status   Specimen Description BLOOD LEFT HAND  Final   Special Requests   Final    BOTTLES DRAWN AEROBIC AND ANAEROBIC Blood Culture adequate volume   Culture   Final    NO GROWTH 3 DAYS Performed at [facility], [address], [place], [place] [address]    Report Status PENDING  Incomplete     Radiology Studies: No results found.  Scheduled Meds:  furosemide  40 mg Oral Daily   levothyroxine  150 mcg Oral QAC breakfast   oxybutynin  15 mg Oral Daily   PARoxetine  20 mg Oral Daily   Continuous Infusions:  piperacillin-tazobactam (ZOSYN)  IV 3.375 g (03/04/23 0557)   vancomycin 1,000 mg (03/03/23 2213)     LOS: 3 days    Time spent: 35 mins    Willeen Niece, MD [facility]   If 7PM-7AM, please contact night-coverage

## 2023-03-04 NOTE — Plan of Care (Signed)

## 2023-03-04 NOTE — Care Management Important Message (Signed)
Important Message  Patient Details  Name: [name] MRN: [id number] Date of Birth: 06-25-1933   Medicare Important Message Given:  Yes     Sherilyn Banker 03/04/2023, 4:13 PM

## 2023-03-04 NOTE — Evaluation (Signed)
Physical Therapy Evaluation Patient Details Name: [name] MRN: [id number] DOB: Jul 10, 1933 Today's Date: 03/04/2023  History of Present Illness  Pt is an 89 y.o. female admitted 03/01/23 with pain and swelling of R foot, found to have osteomyelitis with chronic ulcer on dorsum of R foot.   PMH significant for HTN, HLD, HFpEF, depression, anxiety, hypothyroidism and multiple joint surgeries.  Clinical Impression  Patient presents with decreased mobility due to decreased balance, decreased strength and decreased safety awareness.  Evidently able to laterally scoot to wheelchair and onto toilet in the bathroom and move around her home in manual wheelchair prior to admission.  She has daughter to assist with groceries and at times with transportation.  She needed max A for bed mobility today and attempted scoot to wheelchair was not safe with +1 assist though we initiated/attempted.  Feel she will benefit from skilled PT in the acute setting and from follow up inpatient rehab (<3 hours/day) at d/c.        Recommendations for follow up therapy are one component of a multi-disciplinary discharge planning process, led by the attending physician.  Recommendations may be updated based on patient status, additional functional criteria and insurance authorization.  Follow Up Recommendations Can patient physically be transported by private vehicle: No     Assistance Recommended at Discharge Frequent or constant Supervision/Assistance  Patient can return home with the following  A lot of help with bathing/dressing/bathroom;Two people to help with walking and/or transfers;Help with stairs or ramp for entrance;Assist for transportation;Direct supervision/assist for medications management;Assistance with cooking/housework    Equipment Recommendations None recommended by PT  Recommendations for Other Services       Functional Status Assessment Patient has had a recent decline in their functional  status and demonstrates the ability to make significant improvements in function in a reasonable and predictable amount of time.     Precautions / Restrictions Precautions Precautions: Fall Precaution Comments: chronic LE contractures      Mobility  Bed Mobility Overal bed mobility: Needs Assistance Bed Mobility: Supine to Sit, Sit to Supine, Rolling Rolling: Mod assist   Supine to sit: HOB elevated, Max assist Sit to supine: Max assist   General bed mobility comments: assist for legs off bed and to lift trunk upright, assist to supine lifting legs and then repositioning trunk, pt rolling with mod A to place bed pan at end of session    Transfers Overall transfer level: Needs assistance   Transfers: Bed to chair/wheelchair/BSC            Lateral/Scoot Transfers: Mod assist, Max assist General transfer comment: initiated lateral scoot to wheelchair with armrest off, but pt needing increased time for sooting and reports chair is smaller then hers and pt stuck on wheel and uanble to transition safely with +1 A so returned to EOB for pt to eat lunch.    Ambulation/Gait                  Stairs            Wheelchair Mobility    Modified Rankin (Stroke Patients Only)       Balance Overall balance assessment: Needs assistance   Sitting balance-Leahy Scale: Fair Sitting balance - Comments: on EOB with slight posterior lean for eating lunch, supervison for safety  Pertinent Vitals/Pain Pain Assessment Pain Assessment: No/denies pain    Home Living Family/patient expects to be discharged to:: Private residence Living Arrangements: Alone Available Help at Discharge: Family;Available PRN/intermittently Type of Home: House Home Access: Ramped entrance       Home Layout: One level Home Equipment: Agricultural consultant (2 wheels);Rollator (4 wheels);Standard Walker;Wheelchair - manual;Grab bars -  toilet Additional Comments: Waste management picks up her trash for her, One daughter nearby, limited help. Uses a transportation service for MD appointments.    Prior Function Prior Level of Function : Needs assist             Mobility Comments: moves via lateral scoot between recliner and wheelchair ADLs Comments: daughter helps some with transportation and groceries     Hand Dominance   Dominant Hand: Right    Extremity/Trunk Assessment   Upper Extremity Assessment Upper Extremity Assessment: RUE deficits/detail;LUE deficits/detail RUE Deficits / Details: audible crepitus in shoulder and limited ROM using hand to help scoot but too weak to make transition to chair LUE Deficits / Details: reports L arm is weaker, noted arthritic changes throughout hand and wrist    Lower Extremity Assessment Lower Extremity Assessment: LLE deficits/detail;RLE deficits/detail RLE Deficits / Details: knee and hip flexion contractures, keeping flexed in bed and unable to move past about 70 degrees flexion in sitting or supine; pushes through feet minimally to attempt transfer to chair LLE Deficits / Details: knee and hip flexion contractures, keeping flexed in bed and unable to move past about 70 degrees flexion in sitting or supine; pushes through feet minimally to attempt transfer to chair    Cervical / Trunk Assessment Cervical / Trunk Assessment: Kyphotic  Communication   Communication: No difficulties  Cognition Arousal/Alertness: Awake/alert Behavior During Therapy: WFL for tasks assessed/performed Overall Cognitive Status: No family/caregiver present to determine baseline cognitive functioning                                 General Comments: likely at baseline, decreased memory repeating same story several times during session, decreased safety awareness        General Comments      Exercises     Assessment/Plan    PT Assessment Patient needs continued PT  services  PT Problem List Decreased balance;Decreased mobility;Decreased strength;Decreased knowledge of use of DME;Decreased safety awareness;Decreased activity tolerance       PT Treatment Interventions DME instruction;Functional mobility training;Balance training;Patient/family education;Therapeutic activities;Therapeutic exercise;Wheelchair mobility training    PT Goals (Current goals can be found in the Care Plan section)  Acute Rehab PT Goals Patient Stated Goal: to heal wounds, not have an amputation PT Goal Formulation: With patient Time For Goal Achievement: 03/18/23 Potential to Achieve Goals: Fair    Frequency Min 2X/week     Co-evaluation               AM-PAC PT "6 Clicks" Mobility  Outcome Measure Help needed turning from your back to your side while in a flat bed without using bedrails?: A Lot Help needed moving from lying on your back to sitting on the side of a flat bed without using bedrails?: Total Help needed moving to and from a bed to a chair (including a wheelchair)?: Total Help needed standing up from a chair using your arms (e.g., wheelchair or bedside chair)?: Total Help needed to walk in hospital room?: Total Help needed climbing 3-5 steps with a railing? :  Total 6 Click Score: 7    End of Session   Activity Tolerance: Patient tolerated treatment well Patient left: in bed;with call bell/phone within reach   PT Visit Diagnosis: Muscle weakness (generalized) (M62.81);Other abnormalities of gait and mobility (R26.89)    Time: 1610-9604 PT Time Calculation (min) (ACUTE ONLY): 47 min   Charges:   PT Evaluation $PT Eval Moderate Complexity: 1 Mod PT Treatments $Therapeutic Activity: 23-37 mins        Sheran Lawless, PT Acute Rehabilitation Services Office:[phone] 03/04/2023   Elray Mcgregor 03/04/2023, 5:51 PM

## 2023-03-05 DIAGNOSIS — M869 Osteomyelitis, unspecified: Secondary | ICD-10-CM | POA: Diagnosis not present

## 2023-03-05 LAB — CULTURE, BLOOD (ROUTINE X 2)
Culture: NO GROWTH
Culture: NO GROWTH
Special Requests: ADEQUATE

## 2023-03-05 NOTE — Progress Notes (Signed)
PROGRESS NOTE    [name]  ZOX:[id number] DOB: 1932-10-26 DOA: 03/01/2023 PCP: Assunta Found, MD   Brief Narrative: This 89 yrs old female with medical history significant for COPD, acquired hypothyroidism, severe aortic stenosis status post TAVR in August 2023, anemia of chronic disease,  associated baseline hemoglobin range 10-12, who is admitted  with osteomyelitis of the right foot after presenting from home to [facility] ED complaining of Right foot pain. Imaging and additional notable ED work-up: Plain film of the right foot showed interval development of cortical disruption and resorption about the head of the third toe proximal phalanx along with diffuse soft tissue swelling of the foot and a soft tissue ulcer about the dorsum of the foot.  MRI of the right foot findings consistent with osteomyelitis.  Orthopedics is consulted.  Vascular surgery is consulted.  Patient is not a vascular candidate.  Orthopedics recommended safest option would be to discharge patient to skilled nursing facility and wound care daily to her feet.  If the wound gets worse we could proceed with amputation.  Assessment & Plan:   Principal Problem:   Osteomyelitis of toe of right foot (HCC) Active Problems:   Acquired hypothyroidism   Depression   Hypokalemia   Dehydration   Acute prerenal azotemia   History of COPD   Urinary incontinence   Anemia of chronic disease   PVD (peripheral vascular disease) (HCC)  Osteomyelitis of right foot: Patient presented with 1 week history of progressive erythema , pain and swelling. X-ray right foot suggestive of osteomyelitis involving the head of third toe proximal phalanx. Patient has elevated CRP and ESR. Patient has chronic ulcer on the dorsal surface of right foot. Patient does not appear septic. Blood cultures > No growth so far Initiated on IV vancomycin and Zosyn. Orthopedics Dr. Steward Drone consulted, Advised ABI to check pressures. Vascular surgery consult  appreciated.  Patient is not a revascularization candidate.   Orthopedics recommended safest option would be to discharge patient to skilled nursing facility and wound care daily to her feet.  If the wound gets worse we could proceed with amputation.  Hypokalemia: Replaced.  Continue to monitor  Dehydration: Continue IV fluid resuscitation.  COPD: Continue albuterol nebulization. Does not appear in acute exacerbation.  Major depression : Continue Paxil.  Hypothyroidism: Continue Synthroid.  Anemia of chronic disease H&H remains stable, no obvious visible bleeding.   DVT prophylaxis: SCDS Code Status: Full code Family Communication: No family at bed side. Disposition Plan:  Status is: Inpatient Remains inpatient appropriate because: Admitted for right foot osteomyelitis, Podiatry and Vascular surgery consulted. Orthopedics recommended safest option would be to discharge patient to skilled nursing facility and wound care daily to her feet.  If the wound gets worse we could proceed with amputation. Awaiting SNF authorization.   Consultants:  Podiatry Vascular surgery  Procedures:   Antimicrobials:  Anti-infectives (From admission, onward)    Start     Dose/Rate Route Frequency Ordered Stop   03/02/23 2115  vancomycin (VANCOCIN) IVPB 1000 mg/200 mL premix        1,000 mg 200 mL/hr over 60 Minutes Intravenous Every 24 hours 03/01/23 2107     03/02/23 0600  piperacillin-tazobactam (ZOSYN) IVPB 3.375 g        3.375 g 12.5 mL/hr over 240 Minutes Intravenous Every 8 hours 03/01/23 2107     03/01/23 2100  vancomycin (VANCOREADY) IVPB 1750 mg/350 mL        1,750 mg 175 mL/hr over 120 Minutes  Intravenous  Once 03/01/23 2053 03/02/23 0233   03/01/23 2100  piperacillin-tazobactam (ZOSYN) IVPB 3.375 g        3.375 g 100 mL/hr over 30 Minutes Intravenous  Once 03/01/23 2053 03/01/23 2228      Subjective: Patient was seen and examined at bedside.  Overnight events  noted. Both legs are swollen and erythematous.  She is alert and oriented and following commands. Orthopedics recommended no need for amputation at this time.  Objective: Vitals:   03/04/23 0731 03/04/23 2006 03/05/23 0422 03/05/23 0816  BP: (!) 138/54 130/60 117/62 (!) 109/58  Pulse: 94 83 66 79  Resp: 18 17 17 15   Temp: 98.8 F (37.1 C) 98.4 F (36.9 C) 98.2 F (36.8 C) 98.3 F (36.8 C)  TempSrc: Oral   Oral  SpO2: 93% 95% 91% 93%  Weight:        Intake/Output Summary (Last 24 hours) at 03/05/2023 1317 Last data filed at 03/05/2023 0800 Gross per 24 hour  Intake 240 ml  Output 1300 ml  Net -1060 ml    Filed Weights   03/01/23 2100  Weight: 83 kg    Examination:  General exam: Appears comfortable, deconditioned, not in any acute distress. Respiratory system: CTA bilaterally . Respiratory effort normal.  RR 15 Cardiovascular system: S1 & S2 heard, regular rate and rhythm, no murmur. Gastrointestinal system: Abdomen is soft, non tender, non distended, bowel sounds present. Central nervous system: Alert and oriented x 3 . No focal neurological deficits. Extremities: Right third toe unstageable ulceration, right first MTP plantar ulceration, erythema bilateral feet and ankles. Skin: No rashes, lesions or ulcers Psychiatry: Judgement and insight appear normal. Mood & affect appropriate.     Data Reviewed: I have personally reviewed following labs and imaging studies  CBC: Recent Labs  Lab 03/01/23 1357 03/02/23 0242  WBC 7.4 8.0  NEUTROABS 5.0 6.1  HGB 11.0* 10.2*  HCT 35.8* 32.5*  MCV 89.7 88.3  PLT 240 235    Basic Metabolic Panel: Recent Labs  Lab 03/01/23 1357 03/01/23 1943 03/02/23 0242  NA 140  --  140  K 2.9*  --  3.5  CL 100  --  102  CO2 29  --  26  GLUCOSE 96  --  87  BUN 24*  --  16  CREATININE 1.05*  --  0.86  CALCIUM 8.4*  --  7.8*  MG  --  2.0 1.8    GFR: Estimated Creatinine Clearance: 52 mL/min (by C-G formula based on SCr of 0.86  mg/dL). Liver Function Tests: Recent Labs  Lab 03/01/23 1357 03/02/23 0242  AST 30 29  ALT 19 17  ALKPHOS 90 70  BILITOT 0.6 0.7  PROT 5.7* 5.2*  ALBUMIN 2.8* 2.5*    No results for input(s): "LIPASE", "AMYLASE" in the last 168 hours. No results for input(s): "AMMONIA" in the last 168 hours. Coagulation Profile: Recent Labs  Lab 03/02/23 0242  INR 1.0    Cardiac Enzymes: No results for input(s): "CKTOTAL", "CKMB", "CKMBINDEX", "TROPONINI" in the last 168 hours. BNP (last 3 results) No results for input(s): "PROBNP" in the last 8760 hours. HbA1C: No results for input(s): "HGBA1C" in the last 72 hours. CBG: No results for input(s): "GLUCAP" in the last 168 hours. Lipid Profile: No results for input(s): "CHOL", "HDL", "LDLCALC", "TRIG", "CHOLHDL", "LDLDIRECT" in the last 72 hours. Thyroid Function Tests: No results for input(s): "TSH", "T4TOTAL", "FREET4", "T3FREE", "THYROIDAB" in the last 72 hours. Anemia Panel: No results for  input(s): "VITAMINB12", "FOLATE", "FERRITIN", "TIBC", "IRON", "RETICCTPCT" in the last 72 hours. Sepsis Labs: No results for input(s): "PROCALCITON", "LATICACIDVEN" in the last 168 hours.  Recent Results (from the past 240 hour(s))  Blood culture (routine x 2)     Status: None (Preliminary result)   Collection Time: 03/01/23  8:41 PM   Specimen: BLOOD  Result Value Ref Range Status   Specimen Description BLOOD SITE NOT SPECIFIED  Final   Special Requests   Final    BOTTLES DRAWN AEROBIC AND ANAEROBIC Blood Culture adequate volume   Culture   Final    NO GROWTH 4 DAYS Performed at [facility], [address] [place], [place] [address]    Report Status PENDING  Incomplete  Blood culture (routine x 2)     Status: None (Preliminary result)   Collection Time: 03/01/23  9:12 PM   Specimen: BLOOD LEFT HAND  Result Value Ref Range Status   Specimen Description BLOOD LEFT HAND  Final   Special Requests   Final    BOTTLES DRAWN AEROBIC AND  ANAEROBIC Blood Culture adequate volume   Culture   Final    NO GROWTH 4 DAYS Performed at [facility], [address] [place], [place] [address]    Report Status PENDING  Incomplete     Radiology Studies: No results found.  Scheduled Meds:  furosemide  40 mg Oral Daily   levothyroxine  150 mcg Oral QAC breakfast   oxybutynin  15 mg Oral Daily   PARoxetine  20 mg Oral Daily   Continuous Infusions:  piperacillin-tazobactam (ZOSYN)  IV 3.375 g (03/05/23 0521)   vancomycin 1,000 mg (03/04/23 2048)     LOS: 4 days    Time spent: 35 mins    Willeen Niece, MD [facility]   If 7PM-7AM, please contact night-coverage

## 2023-03-05 NOTE — NC FL2 (Signed)
[place] MEDICAID FL2 LEVEL OF CARE FORM     IDENTIFICATION  Patient Name: [name] Birthdate: April 15, 1933 Sex: female Admission Date (Current Location): 03/01/2023  [facility] and [place] Number:  Producer, television/film/video and Address:  [facility] [place], [address] [place], [place] [address]      Provider Number: [id number]  Attending Physician Name and Address:  Willeen Niece, MD  Relative Name and Phone Number:  Merridith, Dolloff (Dg)  [phone]    Current Level of Care: Hospital Recommended Level of Care: Skilled Nursing Facility Prior Approval Number:    Date Approved/Denied: 03/05/23 PASRR Number: [id number]  Discharge Plan: SNF    Current Diagnoses: Patient Active Problem List   Diagnosis Date Noted   PVD (peripheral vascular disease) (HCC) 03/02/2023   Osteomyelitis of toe of right foot (HCC) 03/01/2023   Dehydration 03/01/2023   Acute prerenal azotemia 03/01/2023   History of COPD 03/01/2023   Urinary incontinence 03/01/2023   Anemia of chronic disease 03/01/2023   Cellulitis 01/06/2023   Pressure ulcer of sacral region, stage 2 (HCC) 01/06/2023   Intertriginous dermatitis associated with moisture 01/06/2023   S/P TAVR (transcatheter aortic valve replacement) 05/25/2022   Nausea and vomiting 05/18/2022   Severe aortic stenosis    Depression 02/12/2017   Anxiety 02/12/2017   Hypokalemia 02/12/2017   Biliary colic    Acquired hypothyroidism    Hyperlipidemia    KNEE, ARTHRITIS, DEGEN./OSTEO 05/14/2010    Orientation RESPIRATION BLADDER Height & Weight     Self, Time, Situation, Place  Normal Continent Weight: 182 lb 15.7 oz (83 kg) Height:     BEHAVIORAL SYMPTOMS/MOOD NEUROLOGICAL BOWEL NUTRITION STATUS      Incontinent Diet (please see clinical summary)  AMBULATORY STATUS COMMUNICATION OF NEEDS Skin   Limited Assist Verbally Skin abrasions (Please see HPI or clinical summary)                       Personal  Care Assistance Level of Assistance  Dressing     Dressing Assistance: Limited assistance     Functional Limitations Info             SPECIAL CARE FACTORS FREQUENCY  PT (By licensed PT), OT (By licensed OT)     PT Frequency: 5x OT Frequency: 3x            Contractures Contractures Info: Not present    Additional Factors Info  Code Status Code Status Info: Full code             Current Medications (03/05/2023):  This is the current hospital active medication list Current Facility-Administered Medications  Medication Dose Route Frequency Provider Last Rate Last Admin   acetaminophen (TYLENOL) tablet 650 mg  650 mg Oral Q6H PRN Howerter, Justin B, DO   650 mg at 03/05/23 0344   Or   acetaminophen (TYLENOL) suppository 650 mg  650 mg Rectal Q6H PRN Howerter, Justin B, DO       albuterol (PROVENTIL) (2.5 MG/3ML) 0.083% nebulizer solution 2.5 mg  2.5 mg Nebulization Q4H PRN Howerter, Justin B, DO       furosemide (LASIX) tablet 40 mg  40 mg Oral Daily Idelle Leech, Pardeep, MD   40 mg at 03/05/23 0900   levothyroxine (SYNTHROID) tablet 150 mcg  150 mcg Oral QAC breakfast Willeen Niece, MD   150 mcg at 03/05/23 0521   melatonin tablet 3 mg  3 mg Oral QHS PRN Howerter, Chaney Born,  DO   3 mg at 03/04/23 2157   morphine (PF) 4 MG/ML injection 4 mg  4 mg Intravenous Q3H PRN Howerter, Justin B, DO   4 mg at 03/05/23 0819   naloxone (NARCAN) injection 0.4 mg  0.4 mg Intravenous PRN Howerter, Justin B, DO       ondansetron (ZOFRAN) injection 4 mg  4 mg Intravenous Q6H PRN Howerter, Justin B, DO       oxybutynin (DITROPAN-XL) 24 hr tablet 15 mg  15 mg Oral Daily Idelle Leech, Pardeep, MD   15 mg at 03/05/23 0900   PARoxetine (PAXIL) tablet 20 mg  20 mg Oral Daily Idelle Leech, Pardeep, MD   20 mg at 03/05/23 0900   piperacillin-tazobactam (ZOSYN) IVPB 3.375 g  3.375 g Intravenous Q8H Francena Hanly, RPH 12.5 mL/hr at 03/05/23 1402 3.375 g at 03/05/23 1402   vancomycin (VANCOCIN) IVPB 1000 mg/200 mL  premix  1,000 mg Intravenous Q24H Francena Hanly, RPH 200 mL/hr at 03/04/23 2048 1,000 mg at 03/04/23 2048     Discharge Medications: Please see discharge summary for a list of discharge medications.  Relevant Imaging Results:  Relevant Lab Results:   Additional Information Serita Kyle, LCSW (Pt SS#: [id number])  Helene Kelp, LCSW

## 2023-03-05 NOTE — TOC Progression Note (Addendum)
Transition of Care [facility]) - Progression Note    Patient Details  Name: [name] MRN: [id number] Date of Birth: 07/19/1933  [facility]) CM/SW Contact  Helene Kelp, [place] Phone Number: 03/05/2023, 1:46 PM  Clinical Narrative:    CSW met with CN and bedside RN to review patient disposition. PT note recommendations are not in, during this shift.  RN/CN noted they will follow-up with clinical team in the nex clinical progression to idenitfy patient disposition. Fl2 & PASRR ([id number]) completed.  TOC CSW wlll continue to follow and provide clinical support per recommendations the clinical team.    Expected Discharge Plan and Services     Social Determinants of Health (SDOH) Interventions SDOH Screenings   Food Insecurity: No Food Insecurity (03/02/2023)  Housing: Patient Declined (03/02/2023)  Transportation Needs: No Transportation Needs (03/02/2023)  Utilities: Not At Risk (03/02/2023)  Financial Resource Strain: Low Risk  (01/28/2023)  Tobacco Use: Medium Risk (03/01/2023)    Readmission Risk Interventions    05/26/2022   11:22 AM  Readmission Risk Prevention Plan  Post Dischage Appt Complete  Medication Screening Complete  Transportation Screening Complete

## 2023-03-06 DIAGNOSIS — M869 Osteomyelitis, unspecified: Secondary | ICD-10-CM | POA: Diagnosis not present

## 2023-03-06 LAB — CULTURE, BLOOD (ROUTINE X 2)

## 2023-03-06 MED ORDER — NYSTATIN 100000 UNIT/ML MT SUSP
5.0000 mL | Freq: Four times a day (QID) | OROMUCOSAL | Status: DC
  Administered 2023-03-06 – 2023-03-09 (×11): 500000 [IU] via ORAL
  Filled 2023-03-06 (×11): qty 5

## 2023-03-06 MED ORDER — AMOXICILLIN-POT CLAVULANATE 875-125 MG PO TABS
1.0000 | ORAL_TABLET | Freq: Two times a day (BID) | ORAL | Status: DC
  Administered 2023-03-06 – 2023-03-09 (×7): 1 via ORAL
  Filled 2023-03-06 (×7): qty 1

## 2023-03-06 MED ORDER — DOXYCYCLINE HYCLATE 100 MG PO TABS
100.0000 mg | ORAL_TABLET | Freq: Two times a day (BID) | ORAL | Status: DC
  Administered 2023-03-06 – 2023-03-09 (×7): 100 mg via ORAL
  Filled 2023-03-06 (×7): qty 1

## 2023-03-06 MED ORDER — PHENOL 1.4 % MT LIQD: 1.0000 | OROMUCOSAL | Status: DC | PRN

## 2023-03-06 NOTE — Progress Notes (Signed)
  Progress Note   Patient: [name] QVZ:[id number] DOB: 08-19-1933 DOA: 03/01/2023     5 DOS: the patient was seen and examined on 03/06/2023 at 10:07AM      Brief hospital course: Mrs. Othman is an 89 y.o. F with COPD, AS s/p TAVR, and hypothyroidism who presented with osteomyelitis.  Orthopedics and Vascular consulted, Vascular felt she had no revascularization targets and required BKA.  Orthopedics recommended conservative treatment with antibiotics and wound care.  See prior summary from Dr. Idelle Leech     Assessment and Plan: Osteomyelitis of the proximal, middle, and distal phalanges of right third and fourth toes with cellulitis Vascular consulted, ABIs severely diseased.  No revascularization options available, only AKA, which patient has declined at this time.  -Narrow to Augmentin and doxycycline - See Orthopedics note from 6/6 - Plan for d/c to SNF with daily wound care - Will need to discuss duration of antibiotics with Orthopedics prior to discharge   Hypokalemia Resolved  COPD No acute exacerbation  Mood disorder - Continue Paxil  Hypothyroidism - Continue Synthroid  Anemia of chronic disease Hemoglobin stable       Subjective: Patient has no complaints, no fever no respiratory distress, no change in pain     Physical Exam: BP (!) 151/49 (BP Location: Right Arm)   Pulse 70   Temp 99 F (37.2 C) (Oral)   Resp 18   Wt 89.1 kg   SpO2 91%   BMI 29.01 kg/m   Elderly adult female, lying in bed, no acute distress RRR, no murmurs, no peripheral edema Respiratory rate normal, lungs clear without rales or wheezes     Disposition: Status is: Inpatient Anticipate discharge to SNF tomorrow        Author: Alberteen Sam, MD 03/06/2023 3:01 PM  For on call review [email or web address].

## 2023-03-06 NOTE — Hospital Course (Addendum)
[name] is an 89 y.o. F with COPD, AS s/p TAVR, and hypothyroidism who presented with osteomyelitis.  Orthopedics and Vascular consulted, Vascular felt she had no revascularization targets and required BKA.  Orthopedics recommended conservative treatment with antibiotics and wound care.  See prior summary from Dr. Idelle Leech

## 2023-03-06 NOTE — Plan of Care (Signed)
  Problem: Activity: Goal: Risk for activity intolerance will decrease Outcome: Not Progressing   Problem: Pain Managment: Goal: General experience of comfort will improve Outcome: Not Progressing   Problem: Safety: Goal: Ability to remain free from injury will improve Outcome: Not Progressing   

## 2023-03-06 NOTE — TOC Initial Note (Signed)
Transition of Care [facility]) - Initial/Assessment Note    Patient Details  Name: [name] MRN: [id number] Date of Birth: 1932/11/06  [facility]) CM/SW Contact:    Donnalee Curry, LCSWA Phone Number: 03/06/2023, 9:15 AM  Clinical Narrative:                  SW spoke with pt's daughter [name] [phone]) reports pt is wheelchair bound at baseline. Pt lives alone with a dog and 2 cats. Pt is mostly IADL but has difficulty reaching countertops. Pt's daughter assists with transportation and groceries.   SW discussed recs for SNF. [name] agreeable. SW explained need for auth and average LOS at SNF.Marland Kitchen [name] reports pt has MCD. SW explained MCD is not currently listed and unable to confirm over the weekend. Will need to f/u during the week. [name] verbalized understanding. SW encouraged [name] to review [email or web address] [name] agreeable to SW sending out to facilites in the area and updated her once bed offers are received. [name] reports due she is available by phone, if she does not answer it OK to leave VM.       Expected Discharge Plan: Skilled Nursing Facility Barriers to Discharge: Continued Medical Work up, SNF Pending bed offer, Insurance Authorization   Patient Goals and CMS Choice   CMS Medicare.gov Compare Post Acute Care list provided to:: Patient Represenative (must comment) (daughter [name]) Choice offered to / list presented to : Adult Children [place] ownership interest in [facility].provided to:: Adult Children    Expected Discharge Plan and Services       Living arrangements for the past 2 months: Single Family Home                                      Prior Living Arrangements/Services Living arrangements for the past 2 months: Single Family Home Lives with:: Self, Pets Patient language and need for interpreter reviewed:: Yes Do you feel safe going back to the place where you live?: Yes      Need for Family Participation in  Patient Care: Yes (Comment)     Criminal Activity/Legal Involvement Pertinent to Current Situation/Hospitalization: No - Comment as needed  Activities of Daily Living Home Assistive Devices/Equipment: Eyeglasses, Wheelchair ADL Screening (condition at time of admission) Patient's cognitive ability adequate to safely complete daily activities?: Yes Is the patient deaf or have difficulty hearing?: No Does the patient have difficulty seeing, even when wearing glasses/contacts?: No Does the patient have difficulty concentrating, remembering, or making decisions?: No Patient able to express need for assistance with ADLs?: Yes Does the patient have difficulty dressing or bathing?: No Independently performs ADLs?: Yes (appropriate for developmental age) Does the patient have difficulty walking or climbing stairs?: Yes Weakness of Legs: Both Weakness of Arms/Hands: Both  Permission Sought/Granted Permission sought to share information with : Oceanographer granted to share information with : Yes, Verbal Permission Granted  Share Information with NAME: [name]  Permission granted to share info w AGENCY: SNF  Permission granted to share info w Relationship: daughter     Emotional Assessment       Orientation: : Oriented to Self, Oriented to Place, Oriented to  Time, Oriented to Situation   Psych Involvement: No (comment)  Admission diagnosis:  Osteomyelitis of right lower extremity (HCC) [M86.9] Ulcer of right foot, unspecified ulcer stage (HCC) [L97.519] Patient Active Problem List  Diagnosis Date Noted   PVD (peripheral vascular disease) (HCC) 03/02/2023   Osteomyelitis of toe of right foot (HCC) 03/01/2023   Dehydration 03/01/2023   Acute prerenal azotemia 03/01/2023   History of COPD 03/01/2023   Urinary incontinence 03/01/2023   Anemia of chronic disease 03/01/2023   Cellulitis 01/06/2023   Pressure ulcer of sacral region, stage 2 (HCC) 01/06/2023    Intertriginous dermatitis associated with moisture 01/06/2023   S/P TAVR (transcatheter aortic valve replacement) 05/25/2022   Nausea and vomiting 05/18/2022   Severe aortic stenosis    Depression 02/12/2017   Anxiety 02/12/2017   Hypokalemia 02/12/2017   Biliary colic    Acquired hypothyroidism    Hyperlipidemia    KNEE, ARTHRITIS, DEGEN./OSTEO 05/14/2010   PCP:  Assunta Found, MD Pharmacy:   [facility] [place], [place] - [phone] [place] [address] Phone: [phone] Fax: [phone]  [facility] [address] [place] [address] Phone: [phone] Fax: [phone]  [facility] [address], [place] - [address] [place] [address] [place] [address] [place] [address] Phone: [phone] Fax: [phone]     Social Determinants of Health (SDOH) Social History: SDOH Screenings   Food Insecurity: No Food Insecurity (03/02/2023)  Housing: Patient Declined (03/02/2023)  Transportation Needs: No Transportation Needs (03/02/2023)  Utilities: Not At Risk (03/02/2023)  Financial Resource Strain: Low Risk  (01/28/2023)  Tobacco Use: Medium Risk (03/01/2023)   SDOH Interventions:     Readmission Risk Interventions    05/26/2022   11:22 AM  Readmission Risk Prevention Plan  Post Dischage Appt Complete  Medication Screening Complete  Transportation Screening Complete

## 2023-03-06 NOTE — Plan of Care (Signed)

## 2023-03-07 DIAGNOSIS — M869 Osteomyelitis, unspecified: Secondary | ICD-10-CM | POA: Diagnosis not present

## 2023-03-07 NOTE — Plan of Care (Signed)
  Problem: Activity: Goal: Risk for activity intolerance will decrease Outcome: Not Progressing   Problem: Pain Managment: Goal: General experience of comfort will improve Outcome: Not Progressing   Problem: Safety: Goal: Ability to remain free from injury will improve Outcome: Not Progressing   

## 2023-03-07 NOTE — Progress Notes (Signed)
Physical Therapy Treatment Patient Details Name: [name] MRN: [id number] DOB: [birth year] Today's Date: 03/07/2023   History of Present Illness Pt is an 89 y.o. female admitted 03/01/23 with pain and swelling of R foot, found to have osteomyelitis with chronic ulcer on dorsum of R foot.   PMH significant for HTN, HLD, HFpEF, depression, anxiety, hypothyroidism and multiple joint surgeries.    PT Comments    Patient resting in bed and reports discomfort along Rt side of gluteal cleft. Max Assist provided to move supine>EOB and pt required intermittent assist to prevent posterior lean. Pt attempted lateral scoots along EOB but unable to clear hips due to significant UE weakness. And Max+2 assist provided to return to supine and reposition. This PT noted weeping wounds on Lt LE and blister on Rt mid back, RN notified and covered wounds with protective dressing. Will continue to progress pt as able. At baseline pt has been limited to lateral scoot transfers for ~ 14 years from recliner<>WC.   Recommendations for follow up therapy are one component of a multi-disciplinary discharge planning process, led by the attending physician.  Recommendations may be updated based on patient status, additional functional criteria and insurance authorization.  Follow Up Recommendations  Can patient physically be transported by private vehicle: No    Assistance Recommended at Discharge Frequent or constant Supervision/Assistance  Patient can return home with the following Two people to help with walking and/or transfers;A lot of help with bathing/dressing/bathroom;Assistance with cooking/housework;Direct supervision/assist for medications management;Direct supervision/assist for financial management;Assist for transportation;Help with stairs or ramp for entrance   Equipment Recommendations  None recommended by PT    Recommendations for Other Services       Precautions / Restrictions  Precautions Precautions: Fall Precaution Comments: chronic LE contractures Restrictions Weight Bearing Restrictions: No     Mobility  Bed Mobility Overal bed mobility: Needs Assistance Bed Mobility: Supine to Sit, Rolling, Sit to Supine Rolling: Mod assist   Supine to sit: Max assist, HOB elevated Sit to supine: Total assist   General bed mobility comments: max assist with cues for use of bed rail and assist to reach rail with Rt UE. Max assist to support bil LE's off EOB. HOB slighlty elevated to raise trunk and Max assist ultimately needed to fully sit up EOB. Total Assist to control lowering trunk and return to supine.    Transfers                   General transfer comment: pt attempted lateral scoot at EOB with cues for head hip relationship provided and for anterior trunk lean. pt unable to lift up for enough clearance to scoot hips laterally. would require Total/dependent transfer for OOB.    Ambulation/Gait                   Stairs             Wheelchair Mobility    Modified Rankin (Stroke Patients Only)       Balance Overall balance assessment: Needs assistance Sitting-balance support: Bilateral upper extremity supported, Feet supported Sitting balance-Leahy Scale: Fair Sitting balance - Comments: slight posterior lean at EOB, occasional assist at trunk to encourage ant lean Postural control: Posterior lean                                  Cognition Arousal/Alertness: Awake/alert Behavior During Therapy: [facility] for tasks assessed/performed  Overall Cognitive Status: No family/caregiver present to determine baseline cognitive functioning                                 General Comments: suspect pt is at baseline; has impaired memory, poor safety awareness, decreased initiation with all tasks        Exercises      General Comments General comments (skin integrity, edema, etc.): multiple open wounds with  sanguineous drainage on Lt LE noted wtih SCD removal, RN notified and mepilex covers applieds. small serous filled blister noted on Rt mid back and cover applied as well.      Pertinent Vitals/Pain Pain Assessment Pain Assessment: 0-10 Pain Score: 6  Pain Location: Rt leg Pain Descriptors / Indicators: Aching Pain Intervention(s): Limited activity within patient's tolerance, Monitored during session, Repositioned, Patient requesting pain meds-RN notified    Home Living                          Prior Function            PT Goals (current goals can now be found in the care plan section) Acute Rehab PT Goals PT Goal Formulation: With patient Time For Goal Achievement: 03/18/23 Potential to Achieve Goals: Fair Progress towards PT goals: Progressing toward goals (slow)    Frequency    Min 2X/week      PT Plan Current plan remains appropriate    Co-evaluation              AM-PAC PT "6 Clicks" Mobility   Outcome Measure  Help needed turning from your back to your side while in a flat bed without using bedrails?: A Lot Help needed moving from lying on your back to sitting on the side of a flat bed without using bedrails?: Total Help needed moving to and from a bed to a chair (including a wheelchair)?: Total Help needed standing up from a chair using your arms (e.g., wheelchair or bedside chair)?: Total Help needed to walk in hospital room?: Total Help needed climbing 3-5 steps with a railing? : Total 6 Click Score: 7    End of Session   Activity Tolerance: Patient tolerated treatment well Patient left: in bed;with call bell/phone within reach;with bed alarm set;with nursing/sitter in room;with SCD's reapplied;Other (comment) (Prevalons applieds bil) Nurse Communication: Mobility status;Need for lift equipment;Other (comment);Patient requests pain meds (wounds) PT Visit Diagnosis: Muscle weakness (generalized) (M62.81);Other abnormalities of gait and  mobility (R26.89)     Time: 1308-6578 PT Time Calculation (min) (ACUTE ONLY): 37 min  Charges:  $Therapeutic Activity: 23-37 mins                    Wynn Maudlin, DPT Acute Rehabilitation Services Office [phone]  03/07/23 1:42 PM

## 2023-03-07 NOTE — TOC Progression Note (Signed)
Transition of Care [facility]) - Progression Note    Patient Details  Name: [name] MRN: [id number] Date of Birth: Apr 29, 1933  Transition [facility] Care [facility]) CM/SW Contact  Lorri Frederick, LCSW Phone Number: 03/07/2023, 11:05 AM  Clinical Narrative:   CSW spoke with pt regarding SNF bed offers, she asked CSW to speak with her daughter [name].  CSW spoke with [name] by phone, she is requesting responses from [facility] and [facility].  Medicare choice document faxed to [facility] and given to pt in room.  CSW reached out to those facilities.      Expected Discharge Plan: Skilled Nursing Facility Barriers to Discharge: Continued Medical Work up, SNF Pending bed offer, Insurance Authorization  Expected Discharge Plan and Services       Living arrangements for the past 2 months: Single Family Home                                       Social Determinants of Health (SDOH) Interventions SDOH Screenings   Food Insecurity: No Food Insecurity (03/02/2023)  Housing: Patient Declined (03/02/2023)  Transportation Needs: No Transportation Needs (03/02/2023)  Utilities: Not At Risk (03/02/2023)  Financial Resource Strain: Low Risk  (01/28/2023)  Tobacco Use: Medium Risk (03/01/2023)    Readmission Risk Interventions    05/26/2022   11:22 AM  Readmission Risk Prevention Plan  Post Dischage Appt Complete  Medication Screening Complete  Transportation Screening Complete

## 2023-03-07 NOTE — Progress Notes (Signed)
  Progress Note   Patient: [name] OZD:[id number] DOB: Aug 15, 1933 DOA: 03/01/2023     6 DOS: the patient was seen and examined on 03/07/2023 at 10:07AM      Brief hospital course: [name] is an 89 y.o. F with COPD, AS s/p TAVR, and hypothyroidism who presented with osteomyelitis.  Orthopedics and Vascular consulted, Vascular felt she had no revascularization targets and required BKA.  Orthopedics recommended conservative treatment with antibiotics and wound care.  See prior summary from Dr. Idelle Leech  6/10 : Spoke with the patient and she has agreed for a amputation, sent a message to the orthopedic surgeon.     Assessment and Plan: Osteomyelitis of the proximal, middle, and distal phalanges of right third and fourth toes with cellulitis Vascular consulted, ABIs severely diseased.  No revascularization options available, only AKA, which patient has declined, however upon my discussion with her today 6/10 patient understood that the chances of healing is rather poor and anxious at this time ready to proceed with amputation.  Message has been sent to the orthopedic surgeon Dr. Maryfrances Bunnell  -Patient is currently on Augmentin and Doxy - Will wait for orthopedic surgery to discuss the amputation with the patient again   Hypokalemia Resolved  COPD No acute exacerbation  Mood disorder - Continue Paxil  Hypothyroidism - Continue Synthroid  Anemia of chronic disease Hemoglobin stable       Subjective: Patient has no complaints, no fever no respiratory distress, no change in pain however patient is ready to consider amputation at this time.     Physical Exam: BP (!) 136/42 (BP Location: Right Arm)   Pulse 73   Temp 98.9 F (37.2 C) (Oral)   Resp 17   Wt 89.1 kg   SpO2 94%   BMI 29.01 kg/m   Elderly adult female, lying in bed, no acute distress RRR, no murmurs, no peripheral edema Respiratory rate normal, lungs clear without rales or  wheezes     Disposition: Status is: Inpatient Anticipate discharge to SNF tomorrow        Author: Harold Hedge, MD 03/07/2023 2:12 PM  For on call review [email or web address].

## 2023-03-08 ENCOUNTER — Encounter: Payer: 59 | Admitting: *Deleted

## 2023-03-08 DIAGNOSIS — M869 Osteomyelitis, unspecified: Secondary | ICD-10-CM | POA: Diagnosis not present

## 2023-03-08 NOTE — Consult Note (Signed)
   [facility] CM Inpatient Consult   03/08/2023  [name] 11-09-1932 [id number]  [facility] [[facility]]  Accountable Care Organization [ACO] Patient: [name] Medicare  Primary Care Provider:  Assunta Found, MD with [facility] is listed to provide the follow up   Patient is currently active with [facility] [[facility]] Care Management for chronic disease management services.  Patient has been engaged by a [facility] RN CC and [facility] team for [name] and resources noted.  Our community based plan of care has focused on disease management and community resource support.    Patient will receive a post hospital call and will be evaluated for assessments and disease process education.    Met with the patient at the bedside and she is hopeful for rehab transition.  She speaks of appreciation for [facility] resources when patient was in community.  Plan:  Patient is currently for a SNF level of care for transition for rehab.  If patient transitions to a [facility] affiliated SNF then [facility] RN can follow up for active patient return to community with needs/support.   Of note, [facility] Care Management services does not replace or interfere with any services that are needed or arranged by inpatient [facility] care management team.   For additional questions or referrals please contact:  Charlesetta Shanks, RN BSN CCM [facility]  [phone] business mobile phone Toll free office [phone]  *Concierge Line  [phone] Fax number: [phone] [name]

## 2023-03-08 NOTE — Progress Notes (Signed)
  Progress Note   Patient: [name] ZOX:[id number] DOB: February 27, 1933 DOA: 03/01/2023     7 DOS: the patient was seen and examined on 03/08/2023 at 10:07AM      Brief hospital course: Mrs. Leicht is an 89 y.o. F with COPD, AS s/p TAVR, and hypothyroidism who presented with osteomyelitis.  Orthopedics and Vascular consulted, Vascular felt she had no revascularization targets and required BKA.  Orthopedics recommended conservative treatment with antibiotics and wound care.  See prior summary from Dr. Idelle Leech  6/10 : Spoke with the patient and she has agreed for a amputation, sent a message to the orthopedic surgeon. 6/11: Patient was evaluated by orthopedic surgeon Dr. Lajoyce Corners who at this time feels that the wound is healing satisfactorily and patient could be discharged to the rehab once insurance authorization is obtained     Assessment and Plan: Osteomyelitis of the proximal, middle, and distal phalanges of right third and fourth toes with cellulitis Vascular consulted, ABIs severely diseased.  No revascularization options available, only AKA, which patient has declined, however upon my discussion with her today 6/10 patient understood that the chances of healing is rather poor and anxious at this time ready to proceed with amputation.  Message has been sent to the orthopedic surgeon Dr. Maryfrances Bunnell. The orthopedic surgeon Dr. Lajoyce Corners evaluated the patient today 6/11 at this time determined that the ulcers are healing satisfactorily.  I advised that the patient could be discharged to skilled nursing facility with around-the-clock care.  Pending insurance authorization at this time.  -Patient is currently on Augmentin and Doxy   Hypokalemia Resolved  COPD No acute exacerbation  Mood disorder - Continue Paxil  Hypothyroidism - Continue Synthroid  Anemia of chronic disease Hemoglobin stable       Subjective: Patient has no complaints, no fever no respiratory distress, no  change in pain .  Physical Exam: BP 103/73 (BP Location: Left Arm)   Pulse 71   Temp 98.7 F (37.1 C) (Oral)   Resp 18   Wt 89.1 kg   SpO2 94%   BMI 29.01 kg/m   Elderly adult female, lying in bed, no acute distress RRR, no murmurs, no peripheral edema Respiratory rate normal, lungs clear without rales or wheezes     Disposition: Status is: Inpatient Anticipate discharge to SNF tomorrow        Author: Harold Hedge, MD 03/08/2023 1:57 PM  For on call review [email or web address].

## 2023-03-08 NOTE — Progress Notes (Signed)
Patient ID: [name], female   DOB: [birth year], 89 y.o.   MRN: [id number] Patient's ulcers continue to improve with pressure offloading with the boots and routine wound care.  The large necrotic ulcer on the right great toe first metatarsal head has good granulation tissue.  Recommend continuing with the protective Prevalon boots continue with routine wound care.  Patient will continue to need around-the-clock care and anticipate discharge to skilled nursing.

## 2023-03-08 NOTE — Care Management Important Message (Signed)
Important Message  Patient Details  Name: [name] MRN: [id number] Date of Birth: 02-10-1933   Medicare Important Message Given:  Yes     Sherilyn Banker 03/08/2023, 4:24 PM

## 2023-03-08 NOTE — TOC Progression Note (Addendum)
Transition of Care [facility]) - Progression Note    Patient Details  Name: [name] MRN: [id number] Date of Birth: [birth year]  Transition of Care [facility]) CM/SW Contact  Lorri Frederick, LCSW Phone Number: 03/08/2023, 9:50 AM  Clinical Narrative:   Bed offer received from [facility], daughter [name] does want to accept this offer.  CSW confirmed with [facility]--she can receive pt today if auth approved and DC summary complete by 2pm.  Auth request submitted in [place].    1135: Auth still pending.  1355: Auth still pending.  Informed SNF, daughter, and MD.    Expected Discharge Plan: Skilled Nursing Facility Barriers to Discharge: Continued Medical Work up, SNF Pending bed offer, Insurance Authorization  Expected Discharge Plan and Services       Living arrangements for the past 2 months: Single Family Home                                       Social Determinants of Health (SDOH) Interventions SDOH Screenings   Food Insecurity: No Food Insecurity (03/02/2023)  Housing: Patient Declined (03/02/2023)  Transportation Needs: No Transportation Needs (03/02/2023)  Utilities: Not At Risk (03/02/2023)  Financial Resource Strain: Low Risk  (01/28/2023)  Tobacco Use: Medium Risk (03/01/2023)    Readmission Risk Interventions    05/26/2022   11:22 AM  Readmission Risk Prevention Plan  Post Dischage Appt Complete  Medication Screening Complete  Transportation Screening Complete

## 2023-03-09 DIAGNOSIS — Z821 Family history of blindness and visual loss: Secondary | ICD-10-CM | POA: Diagnosis not present

## 2023-03-09 DIAGNOSIS — J449 Chronic obstructive pulmonary disease, unspecified: Secondary | ICD-10-CM | POA: Diagnosis not present

## 2023-03-09 DIAGNOSIS — M6281 Muscle weakness (generalized): Secondary | ICD-10-CM | POA: Diagnosis not present

## 2023-03-09 DIAGNOSIS — M13169 Monoarthritis, not elsewhere classified, unspecified knee: Secondary | ICD-10-CM | POA: Diagnosis not present

## 2023-03-09 DIAGNOSIS — Z87891 Personal history of nicotine dependence: Secondary | ICD-10-CM | POA: Diagnosis not present

## 2023-03-09 DIAGNOSIS — Z743 Need for continuous supervision: Secondary | ICD-10-CM | POA: Diagnosis not present

## 2023-03-09 DIAGNOSIS — Z952 Presence of prosthetic heart valve: Secondary | ICD-10-CM | POA: Diagnosis not present

## 2023-03-09 DIAGNOSIS — E785 Hyperlipidemia, unspecified: Secondary | ICD-10-CM | POA: Diagnosis not present

## 2023-03-09 DIAGNOSIS — E890 Postprocedural hypothyroidism: Secondary | ICD-10-CM | POA: Diagnosis not present

## 2023-03-09 DIAGNOSIS — Z7401 Bed confinement status: Secondary | ICD-10-CM | POA: Diagnosis not present

## 2023-03-09 DIAGNOSIS — M868X7 Other osteomyelitis, ankle and foot: Secondary | ICD-10-CM | POA: Diagnosis not present

## 2023-03-09 DIAGNOSIS — L97919 Non-pressure chronic ulcer of unspecified part of right lower leg with unspecified severity: Secondary | ICD-10-CM | POA: Diagnosis not present

## 2023-03-09 DIAGNOSIS — Z79890 Hormone replacement therapy (postmenopausal): Secondary | ICD-10-CM | POA: Diagnosis not present

## 2023-03-09 DIAGNOSIS — I70201 Unspecified atherosclerosis of native arteries of extremities, right leg: Secondary | ICD-10-CM | POA: Diagnosis not present

## 2023-03-09 DIAGNOSIS — I2510 Atherosclerotic heart disease of native coronary artery without angina pectoris: Secondary | ICD-10-CM | POA: Diagnosis not present

## 2023-03-09 DIAGNOSIS — I10 Essential (primary) hypertension: Secondary | ICD-10-CM | POA: Diagnosis not present

## 2023-03-09 DIAGNOSIS — L97519 Non-pressure chronic ulcer of other part of right foot with unspecified severity: Secondary | ICD-10-CM | POA: Diagnosis not present

## 2023-03-09 DIAGNOSIS — M24551 Contracture, right hip: Secondary | ICD-10-CM | POA: Diagnosis not present

## 2023-03-09 DIAGNOSIS — E860 Dehydration: Secondary | ICD-10-CM | POA: Diagnosis not present

## 2023-03-09 DIAGNOSIS — I739 Peripheral vascular disease, unspecified: Secondary | ICD-10-CM | POA: Diagnosis not present

## 2023-03-09 DIAGNOSIS — N178 Other acute kidney failure: Secondary | ICD-10-CM | POA: Diagnosis not present

## 2023-03-09 DIAGNOSIS — I4891 Unspecified atrial fibrillation: Secondary | ICD-10-CM | POA: Diagnosis not present

## 2023-03-09 DIAGNOSIS — Z79899 Other long term (current) drug therapy: Secondary | ICD-10-CM | POA: Diagnosis not present

## 2023-03-09 DIAGNOSIS — M869 Osteomyelitis, unspecified: Secondary | ICD-10-CM | POA: Diagnosis not present

## 2023-03-09 DIAGNOSIS — M24561 Contracture, right knee: Secondary | ICD-10-CM | POA: Diagnosis not present

## 2023-03-09 DIAGNOSIS — M86171 Other acute osteomyelitis, right ankle and foot: Secondary | ICD-10-CM | POA: Diagnosis not present

## 2023-03-09 DIAGNOSIS — M86671 Other chronic osteomyelitis, right ankle and foot: Secondary | ICD-10-CM | POA: Diagnosis not present

## 2023-03-09 DIAGNOSIS — Z96653 Presence of artificial knee joint, bilateral: Secondary | ICD-10-CM | POA: Diagnosis not present

## 2023-03-09 DIAGNOSIS — R2689 Other abnormalities of gait and mobility: Secondary | ICD-10-CM | POA: Diagnosis not present

## 2023-03-09 DIAGNOSIS — E876 Hypokalemia: Secondary | ICD-10-CM | POA: Diagnosis not present

## 2023-03-09 DIAGNOSIS — R531 Weakness: Secondary | ICD-10-CM | POA: Diagnosis not present

## 2023-03-09 DIAGNOSIS — Z96642 Presence of left artificial hip joint: Secondary | ICD-10-CM | POA: Diagnosis not present

## 2023-03-09 DIAGNOSIS — E039 Hypothyroidism, unspecified: Secondary | ICD-10-CM | POA: Diagnosis not present

## 2023-03-09 DIAGNOSIS — I96 Gangrene, not elsewhere classified: Secondary | ICD-10-CM | POA: Diagnosis not present

## 2023-03-09 DIAGNOSIS — I70261 Atherosclerosis of native arteries of extremities with gangrene, right leg: Secondary | ICD-10-CM | POA: Diagnosis not present

## 2023-03-09 MED ORDER — AMOXICILLIN-POT CLAVULANATE 875-125 MG PO TABS: 1.0000 | ORAL_TABLET | Freq: Two times a day (BID) | ORAL | 0 refills | Status: DC

## 2023-03-09 MED ORDER — DOXYCYCLINE HYCLATE 100 MG PO TABS: 100.0000 mg | ORAL_TABLET | Freq: Two times a day (BID) | ORAL | 0 refills | Status: DC

## 2023-03-09 MED ORDER — FUROSEMIDE 40 MG PO TABS: 40.0000 mg | ORAL_TABLET | Freq: Every day | ORAL | 0 refills | Status: DC

## 2023-03-09 NOTE — TOC Transition Note (Signed)
Transition of Care [facility]) - CM/SW Discharge Note   Patient Details  Name: [name] MRN: [id number] Date of Birth: November 30, 1932  Transition [facility] Care [facility]) CM/SW Contact:  Lorri Frederick, LCSW Phone Number: 03/09/2023, 12:53 PM   Clinical Narrative:  Pt discharging to [name].  RN call report to [phone].     Final next level of care: Skilled Nursing Facility Barriers to Discharge: Barriers Resolved   Patient Goals and CMS Choice CMS Medicare.gov Compare Post Acute Care list provided to:: Patient Represenative (must comment) (daughter [name]) Choice offered to / list presented to : Adult Children  Discharge Placement                Patient chooses bed at:  [facility]) Patient to be transferred to facility by: [facility] Name of family member notified: daughter [name] Patient and family notified of of transfer: 03/09/23  Discharge Plan and Services Additional resources added to the After Visit Summary for                                       Social Determinants of Health (SDOH) Interventions SDOH Screenings   Food Insecurity: No Food Insecurity (03/02/2023)  Housing: Patient Declined (03/02/2023)  Transportation Needs: No Transportation Needs (03/02/2023)  Utilities: Not At Risk (03/02/2023)  Financial Resource Strain: Low Risk  (01/28/2023)  Tobacco Use: Medium Risk (03/01/2023)     Readmission Risk Interventions    05/26/2022   11:22 AM  Readmission Risk Prevention Plan  Post Dischage Appt Complete  Medication Screening Complete  Transportation Screening Complete

## 2023-03-09 NOTE — TOC Progression Note (Addendum)
Transition of Care [facility]) - Progression Note    Patient Details  Name: [name] MRN: [id number] Date of Birth: Oct 23, 1932  Transition [facility] Care [facility]) CM/SW Contact  Lorri Frederick, LCSW Phone Number: 03/09/2023, 9:58 AM  Clinical Narrative:   Message from [facility] requesting MD progress note, which was uploaded to portal.  1020: auth approved: [id number], [id number], 3 days: 6/12-6/14.  MD notified.  CSW confirmed with [facility] that she can receive pt today.   Expected Discharge Plan: Skilled Nursing Facility Barriers to Discharge: Continued Medical Work up, SNF Pending bed offer, Insurance Authorization  Expected Discharge Plan and Services       Living arrangements for the past 2 months: Single Family Home                                       Social Determinants of Health (SDOH) Interventions SDOH Screenings   Food Insecurity: No Food Insecurity (03/02/2023)  Housing: Patient Declined (03/02/2023)  Transportation Needs: No Transportation Needs (03/02/2023)  Utilities: Not At Risk (03/02/2023)  Financial Resource Strain: Low Risk  (01/28/2023)  Tobacco Use: Medium Risk (03/01/2023)    Readmission Risk Interventions    05/26/2022   11:22 AM  Readmission Risk Prevention Plan  Post Dischage Appt Complete  Medication Screening Complete  Transportation Screening Complete

## 2023-03-09 NOTE — Discharge Summary (Signed)
Physician Discharge Summary   Patient: [name] MRN: [id number] DOB: 08/20/1933  Admit date:     03/01/2023  Discharge date: 03/09/23  Discharge Physician: Rickey Barbara   PCP: Assunta Found, MD   Recommendations at discharge:    Follow up with PCP in 1-2 weeks Follow up with Orthopedic Surgery as scheduled  Discharge Diagnoses: Principal Problem:   Osteomyelitis of toe of right foot (HCC) Active Problems:   Acquired hypothyroidism   Depression   Hypokalemia   Dehydration   Acute prerenal azotemia   History of COPD   Urinary incontinence   Anemia of chronic disease   PVD (peripheral vascular disease) (HCC)  Resolved Problems:   * No resolved hospital problems. [facility] Course: [name] is an 89 y.o. F with COPD, AS s/p TAVR, and hypothyroidism who presented with osteomyelitis.  Orthopedics and Vascular consulted, Vascular felt she had no revascularization targets and required BKA.  Orthopedics recommended conservative treatment with antibiotics and wound care.  See prior summary from Dr. Idelle Leech  Assessment and Plan: Osteomyelitis of the proximal, middle, and distal phalanges of right third and fourth toes with cellulitis Vascular consulted, ABIs severely diseased.  No revascularization options available, only AKA, which patient has declined, however upon my discussion with her today 6/10 patient understood that the chances of healing is rather poor and anxious at this time ready to proceed with amputation.  Message has been sent to the orthopedic surgeon Dr. Maryfrances Bunnell. The orthopedic surgeon Dr. Lajoyce Corners evaluated the patient on 6/11 at this time determined that the ulcers are healing satisfactorily.   -Plan to d/c to SNF -Discussed with Dr. Lajoyce Corners. Recs for 3 weeks of abx, prescribed -If no improvement despite antibiotics and Prevalon boots, then pt may ultimately need surgery   -Patient is currently on Augmentin and Doxy     Hypokalemia Resolved   COPD No  acute exacerbation   Mood disorder - Continue Paxil   Hypothyroidism - Continue Synthroid   Anemia of chronic disease Hemoglobin stable       Consultants: Orthopedic Surgery Procedures performed:   Disposition: Skilled nursing facility Diet recommendation:  Regular diet DISCHARGE MEDICATION: Allergies as of 03/09/2023   No Active Allergies      Medication List     STOP taking these medications    amoxicillin 875 MG tablet Commonly known as: AMOXIL   potassium chloride SA 20 MEQ tablet Commonly known as: KLOR-CON M       TAKE these medications    acetaminophen 325 MG tablet Commonly known as: TYLENOL Take 2 tablets (650 mg total) by mouth every 6 (six) hours as needed for mild pain (or Fever >/= 101).   amoxicillin-clavulanate 875-125 MG tablet Commonly known as: AUGMENTIN Take 1 tablet by mouth every 12 (twelve) hours for 21 days.   doxycycline 100 MG tablet Commonly known as: VIBRA-TABS Take 1 tablet (100 mg total) by mouth every 12 (twelve) hours for 21 days.   furosemide 40 MG tablet Commonly known as: LASIX Take 1 tablet (40 mg total) by mouth daily. Start taking on: March 10, 2023   ibuprofen 200 MG tablet Commonly known as: ADVIL Take 600 mg by mouth every 6 (six) hours as needed for fever, mild pain or moderate pain.   levothyroxine 150 MCG tablet Commonly known as: SYNTHROID Take 150 mcg by mouth daily before breakfast.   nitroGLYCERIN 0.4 MG SL tablet Commonly known as: NITROSTAT Place 1 tablet (0.4 mg total) under the tongue every  5 (five) minutes as needed for chest pain.   oxybutynin 15 MG 24 hr tablet Commonly known as: DITROPAN XL Take 15 mg by mouth daily.   PARoxetine 20 MG tablet Commonly known as: PAXIL Take 20 mg by mouth daily.        Follow-up Information     [name], MD Follow up in 1 week(s).   Specialty: Orthopedic Surgery Contact information: [address] [place] [address] [phone]          Assunta Found, MD Follow up in 2 week(s).   Specialty: Family Medicine Why: Hospital follow up Contact information: [address] [place] [address] [phone]                Discharge Exam: Ceasar Mons Weights   03/01/23 2100 03/06/23 0504 03/07/23 0500  Weight: 83 kg 89.1 kg 89.1 kg   General exam: Awake, laying in bed, in nad Respiratory system: Normal respiratory effort, no wheezing Cardiovascular system: regular rate, s1, s2 Gastrointestinal system: Soft, nondistended, positive BS Central nervous system: CN2-12 grossly intact, strength intact Extremities: no clubbing, B feet with Prevalon boots in place Skin: Normal skin turgor, no notable skin lesions seen Psychiatry: Mood normal // no visual hallucinations   Condition at discharge: fair  The results of significant diagnostics from this hospitalization (including imaging, microbiology, ancillary and laboratory) are listed below for reference.   Imaging Studies: VAS [place] ABI WITH/WO TBI  Result Date: 03/02/2023  LOWER EXTREMITY DOPPLER STUDY Patient Name:  [name]  Date of Exam:   03/02/2023 Medical Rec #: [id number]            Accession #:    [id number] Date of Birth: December 06, 1932            Patient Gender: F Patient Age:   89 years Exam Location:  [facility] Procedure:      VAS [place] ABI WITH/WO TBI Referring Phys: MARCUS DUDA --------------------------------------------------------------------------------  Indications: PVD with gangrene right lower extremity. High Risk Factors: Hypertension, coronary artery disease.  Limitations: Today's exam was limited due to patient positioning. Comparison Study: No previous study. Performing Technologist: McKayla Maag RVT, [place]  Examination Guidelines: A complete evaluation includes at minimum, Doppler waveform signals and systolic blood pressure reading at the level of bilateral brachial, anterior tibial, and posterior tibial arteries, when vessel segments are  accessible. Bilateral testing is considered an integral part of a complete examination. Photoelectric Plethysmograph (PPG) waveforms and toe systolic pressure readings are included as required and additional duplex testing as needed. Limited examinations for reoccurring indications may be performed as noted.  ABI Findings: +---------+------------------+-----+----------+--------------------------------+ Right    Rt Pressure (mmHg)IndexWaveform  Comment                          +---------+------------------+-----+----------+--------------------------------+ Brachial                        triphasic No pressure obtained due to IV                                             placement.                       +---------+------------------+-----+----------+--------------------------------+ PTA  Not obtained due to patient                                                positioning                      +---------+------------------+-----+----------+--------------------------------+ DP       59                0.49 monophasic                                 +---------+------------------+-----+----------+--------------------------------+ Great Toe29                0.24 Abnormal                                   +---------+------------------+-----+----------+--------------------------------+ +---------+------------------+-----+----------+-------+ Left     Lt Pressure (mmHg)IndexWaveform  Comment +---------+------------------+-----+----------+-------+ Brachial 121                    biphasic          +---------+------------------+-----+----------+-------+ PTA      44                0.36 monophasic        +---------+------------------+-----+----------+-------+ DP       33                0.27 monophasic        +---------+------------------+-----+----------+-------+ Great Toe20                0.17 Abnormal           +---------+------------------+-----+----------+-------+ +-------+-----------+-----------+------------+------------+ ABI/TBIToday's ABIToday's TBIPrevious ABIPrevious TBI +-------+-----------+-----------+------------+------------+ Right  0.49       0.24                                +-------+-----------+-----------+------------+------------+ Left   0.36       0.17                                +-------+-----------+-----------+------------+------------+   Summary: Right: Resting right ankle-brachial index indicates severe right lower extremity arterial disease. The right toe-brachial index is abnormal. Left: Resting left ankle-brachial index indicates severe left lower extremity arterial disease. The left toe-brachial index is abnormal. *See table(s) above for measurements and observations.  Electronically signed by Heath Lark on 03/02/2023 at 6:09:29 PM.    Final    MR FOOT RIGHT W WO CONTRAST  Result Date: 03/01/2023 CLINICAL DATA:  Soft tissue infection suspected. Multiple wounds along the forefoot and big toe. EXAM: MRI OF THE RIGHT FOREFOOT WITHOUT AND WITH CONTRAST TECHNIQUE: Multiplanar, multisequence MR imaging of the right forefoot was performed before and after the administration of intravenous contrast. CONTRAST:  8mL GADAVIST GADOBUTROL 1 MMOL/ML IV SOLN COMPARISON:  Radiograph performed earlier on the same date. FINDINGS: Bones/Joint/Cartilage There is bone marrow edema of the proximal middle and distal phalanges of the third and fourth digits suggesting osteomyelitis. Marrow signal within remaining osseous structures is within normal limits. Marked degenerative changes of the first metatarsophalangeal joints with hallux valgus deformity. Ligaments Lisfranc  ligament is intact. Muscles and Tendons Increased intramuscular signal of the plantar muscles suggesting diabetic myopathy/myositis. Soft tissues Marked subcutaneous soft tissue edema and inflammatory changes most prominent of  the third and fourth digits. Generalized subcutaneous soft tissue edema about the foot. No fluid collection or abscess. IMPRESSION: 1. Bone marrow edema of the proximal, middle and distal phalanges of the third and fourth digits consistent with osteomyelitis. 2. Marked subcutaneous soft tissue edema and inflammatory changes most prominent of the third and fourth digits consistent with cellulitis. No fluid collection or abscess. 3. Increased intramuscular signal of the plantar muscles suggesting diabetic myopathy/myositis. 4. Marked degenerative changes of the first metatarsophalangeal joints with hallux valgus deformity. Electronically Signed   By: Larose Hires D.O.   On: 03/01/2023 22:54   DG Foot 2 Views Right  Result Date: 03/01/2023 CLINICAL DATA:  Foot wound on bottom of right foot EXAM: RIGHT FOOT - 2 VIEW COMPARISON:  Radiographs 01/06/2023 FINDINGS: Demineralization. There is new cortical disruption and resorption about the head of the third toe proximal phalanx. The third toe mid and distal phalanx are not well visualized. Dislocation of the middle phalanx of the fourth toe in relation to the proximal phalanx. Plantar calcaneal spur. Vascular calcifications. Diffuse soft tissue swelling about the foot. Soft tissue ulcer about the dorsum of the foot at the MTP joint on lateral view. IMPRESSION: 1. Third toe osteomyelitis. 2. Dislocation of the right fourth toe middle phalanx. 3. Diffuse soft tissue swelling about the foot. Electronically Signed   By: Minerva Fester M.D.   On: 03/01/2023 19:58    Microbiology: Results for orders placed or performed during the hospital encounter of 03/01/23  Blood culture (routine x 2)     Status: None   Collection Time: 03/01/23  8:41 PM   Specimen: BLOOD  Result Value Ref Range Status   Specimen Description BLOOD SITE NOT SPECIFIED  Final   Special Requests   Final    BOTTLES DRAWN AEROBIC AND ANAEROBIC Blood Culture adequate volume   Culture   Final    NO  GROWTH 5 DAYS Performed at [facility], [address] [place], [place] [address]    Report Status 03/06/2023 FINAL  Final  Blood culture (routine x 2)     Status: None   Collection Time: 03/01/23  9:12 PM   Specimen: BLOOD LEFT HAND  Result Value Ref Range Status   Specimen Description BLOOD LEFT HAND  Final   Special Requests   Final    BOTTLES DRAWN AEROBIC AND ANAEROBIC Blood Culture adequate volume   Culture   Final    NO GROWTH 5 DAYS Performed at [facility], [address], [place], [place] [address]    Report Status 03/06/2023 FINAL  Final    Labs: CBC: No results for input(s): "WBC", "NEUTROABS", "HGB", "HCT", "MCV", "PLT" in the last 168 hours. Basic Metabolic Panel: No results for input(s): "NA", "K", "CL", "CO2", "GLUCOSE", "BUN", "CREATININE", "CALCIUM", "MG", "PHOS" in the last 168 hours. Liver Function Tests: No results for input(s): "AST", "ALT", "ALKPHOS", "BILITOT", "PROT", "ALBUMIN" in the last 168 hours. CBG: No results for input(s): "GLUCAP" in the last 168 hours.  Discharge time spent: less than 30 minutes.  Signed: Rickey Barbara, MD [facility] 03/09/2023

## 2023-03-09 NOTE — Progress Notes (Signed)
Report given to Lissa Hoard, LPN At [facility]. All questions and concerns were fully answered.

## 2023-03-09 NOTE — Progress Notes (Signed)
Discharge documents provided to [facility], [facility] to transport pt to [facility]. Pt remains alert/oriented in no apparent distress. No complaints.

## 2023-03-11 ENCOUNTER — Telehealth: Payer: Self-pay | Admitting: *Deleted

## 2023-03-16 ENCOUNTER — Ambulatory Visit (INDEPENDENT_AMBULATORY_CARE_PROVIDER_SITE_OTHER): Payer: 59 | Admitting: Family

## 2023-03-16 ENCOUNTER — Encounter: Payer: Self-pay | Admitting: Family

## 2023-03-16 DIAGNOSIS — M869 Osteomyelitis, unspecified: Secondary | ICD-10-CM | POA: Diagnosis not present

## 2023-03-16 NOTE — Progress Notes (Signed)
Office Visit Note   Patient: [name]           Date of Birth: [birth year]           MRN: [id number] Visit Date: 03/16/2023              Requested by: Paige Found, MD [address] [place],  [place] [address] PCP: Paige Found, MD  Chief Complaint  Patient presents with   Right Foot - Follow-up      HPI: The patient is an 89 year old woman seen for a concern of osteomyelitis of the right foot following hospitalization for same from 6/4-6/12. Discussed amputation with patient and family during hospitalization, they opted for conservative management. Today patient is open to limb salvage surgery.   Feels well. Some pain in foot which is moderate. No fever or chills.  Assessment & Plan: Visit Diagnoses:  1. Osteomyelitis of toe of right foot (HCC)     Plan: Osteomyelitis of the proximal, middle, and distal phalanges of right third and fourth toes with cellulitis: given instructions to continue the oral augmentin and doxy. Continue daily wound care.  Have discussed conservative management and associated risks. Due to hip and knee contractures AKA would be best option. Have offered AKA. Patient to discuss with daughter [name] and call us if would like to proceed with surgery.   Follow-Up Instructions: Return in about 2 weeks (around 03/30/2023).   Ortho Exam  Patient is alert, oriented, no adenopathy, well-dressed, normal affect, normal respiratory effort. On examination of the right foot there is trace edema with mild erythema there is significant ulceration beneath the first metatarsal head 3 cm in diameter with 5 mm of depth this does probe to bone.  There is maceration and skin breakdown in the second third and fourth webspaces with ulceration No ascending cellulitis today  Imaging: No results Chen. No images are attached to the encounter.  Labs: Lab Results  Component Value Date   HGBA1C 5.1 01/07/2023   ESRSEDRATE 33 (H) 03/01/2023   ESRSEDRATE 44 (H)  01/06/2023   ESRSEDRATE 44 (H) 05/29/2011   CRP 2.9 (H) 03/01/2023   CRP 3.4 (H) 01/06/2023   CRP 20.27 (H) 05/29/2011   REPTSTATUS 03/06/2023 FINAL 03/01/2023   GRAMSTAIN  06/04/2011    ABUNDANT WBC PRESENT, PREDOMINANTLY PMN NO SQUAMOUS EPITHELIAL CELLS SEEN NO ORGANISMS SEEN   GRAMSTAIN  06/04/2011    ABUNDANT WBC PRESENT, PREDOMINANTLY PMN NO SQUAMOUS EPITHELIAL CELLS SEEN NO ORGANISMS SEEN   CULT  03/01/2023    NO GROWTH 5 DAYS Performed at [facility], [address], [place], [place] [address]      Lab Results  Component Value Date   ALBUMIN 2.5 (L) 03/02/2023   ALBUMIN 2.8 (L) 03/01/2023   ALBUMIN 3.2 (L) 01/06/2023    Lab Results  Component Value Date   MG 1.8 03/02/2023   MG 2.0 03/01/2023   MG 1.9 05/26/2022   No results Chen for: "VD25OH"  No results Chen for: "PREALBUMIN"    Latest Ref Rng & Units 03/02/2023    2:42 AM 03/01/2023    1:57 PM 01/07/2023   12:29 AM  CBC EXTENDED  WBC 4.0 - 10.5 K/uL 8.0  7.4  8.8   RBC 3.87 - 5.11 MIL/uL 3.68  3.99  3.54   Hemoglobin 12.0 - 15.0 g/dL 82.9  56.2  13.0   HCT 36.0 - 46.0 % 32.5  35.8  31.0   Platelets 150 - 400  K/uL 235  240  257   NEUT# 1.7 - 7.7 K/uL 6.1  5.0    Lymph# 0.7 - 4.0 K/uL 1.4  1.5       There is no height or weight on file to calculate BMI.  Orders:  No orders of the defined types were placed in this encounter.  No orders of the defined types were placed in this encounter.    Procedures: No procedures performed  Clinical Data: No additional findings.  ROS:  All other systems negative, except as noted in the HPI. Review of Systems  Objective: Vital Signs: There were no vitals taken for this visit.  Specialty Comments:  No specialty comments available.  PMFS History: Patient Active Problem List   Diagnosis Date Noted   PVD (peripheral vascular disease) (HCC) 03/02/2023   Osteomyelitis of toe of right foot (HCC) 03/01/2023   Dehydration 03/01/2023   Acute  prerenal azotemia 03/01/2023   History of COPD 03/01/2023   Urinary incontinence 03/01/2023   Anemia of chronic disease 03/01/2023   Cellulitis 01/06/2023   Pressure ulcer of sacral region, stage 2 (HCC) 01/06/2023   Intertriginous dermatitis associated with moisture 01/06/2023   S/P TAVR (transcatheter aortic valve replacement) 05/25/2022   Nausea and vomiting 05/18/2022   Severe aortic stenosis    Depression 02/12/2017   Anxiety 02/12/2017   Hypokalemia 02/12/2017   Biliary colic    Acquired hypothyroidism    Hyperlipidemia    KNEE, ARTHRITIS, DEGEN./OSTEO 05/14/2010   Past Medical History:  Diagnosis Date   Anxiety    Arthritis    CAD (coronary artery disease)    Mild at cardiac catheterization 2018   Chronic bronchitis    Constipation    Depression    Hyperlipidemia    Hypokalemia 02/12/2017   Hypothyroidism    S/P TAVR (transcatheter aortic valve replacement) 05/25/2022   26mm S3UR via TF approach with Dr. Clifton James and Dr. Laneta Simmers   Severe aortic stenosis    Wound of left leg 01/07/2023    Family History  Problem Relation Age of Onset   Colon cancer Mother    Blindness Sister    Anesthesia problems Neg Hx    Hypotension Neg Hx    Malignant hyperthermia Neg Hx    Pseudochol deficiency Neg Hx     Past Surgical History:  Procedure Laterality Date   BUNIONECTOMY Left 1975   CARDIAC CATHETERIZATION  07/04/2020   CHOLECYSTECTOMY N/A 02/15/2017   Procedure: LAPAROSCOPIC CHOLECYSTECTOMY;  Surgeon: Axel Filler, MD;  [facility];  Service: General;  Laterality: N/A;   CORONARY ANGIOGRAPHY N/A 02/14/2017   Procedure: Coronary Angiography;  Surgeon: Yvonne Kendall, MD;  Location: [facility] INVASIVE CV LAB;  Service: Cardiovascular;  Laterality: N/A;   EYE SURGERY  2009   bilateral   HEMATOMA EVACUATION  06/04/2011   Procedure: EVACUATION HEMATOMA;  Surgeon: Fuller Canada, MD;  Location: [facility] ORS;  Service: Orthopedics;  Laterality: Right;  evacuation of seroma    INTRAOPERATIVE TRANSTHORACIC ECHOCARDIOGRAM N/A 05/25/2022   Procedure: INTRAOPERATIVE TRANSTHORACIC ECHOCARDIOGRAM;  Surgeon: Kathleene Hazel, MD;  Location: [facility] INVASIVE CV LAB;  Service: Open Heart Surgery;  Laterality: N/A;   JOINT REPLACEMENT  05/2010   left total knee Dr. Romeo Apple   PATELLAR TENDON REPAIR  06/04/2011   Procedure: PATELLA TENDON REPAIR;  Surgeon: Fuller Canada, MD;  Location: [facility] ORS;  Service: Orthopedics;  Laterality: Right;   PATELLAR TENDON REPAIR  08/30/2011   Procedure: PATELLA TENDON REPAIR;  Surgeon: Fuller Canada, MD;  Location: [facility] ORS;  Service: Orthopedics;  Laterality: Right;  Allograft Reconstrucation Right Patella Tendon   RIGHT/LEFT HEART CATH AND CORONARY ANGIOGRAPHY N/A 07/04/2020   Procedure: RIGHT/LEFT HEART CATH AND CORONARY ANGIOGRAPHY;  Surgeon: Kathleene Hazel, MD;  Location: [facility] INVAS[facility] CV LAB;  Service: Cardiovascular;  Laterality: N/A;   TOTAL HIP ARTHROPLASTY Left 1998   TOTAL KNEE ARTHROPLASTY  05/03/2011   Procedure: TOTAL KNEE ARTHROPLASTY;  Surgeon: Fuller Canada, MD;  Location: [facility] OR[facility];  Service: Orthopedics;  Laterality: Right;  With DePuy   TOTAL THYROIDECTOMY  11/2009   Dr. Suszanne Conners @ [facility]   TRANSCATHETER AORTIC VALVE REPLACEMENT, TRANSFEMORAL Right 05/25/2022   Procedure: Transcatheter Aortic Valve Replacement, Transfemoral;  Surgeon: Kathleene Hazel, MD;  Location: [facility] INVASIVE CV LAB;  Service: Open Heart Surgery;  Laterality: Right;   TUBAL LIGATION     Social History   Occupational History   Occupation: Retired-Office work  Tobacco Use   Smoking status: Former    Types: Cigarettes    Quit date: 09/27/1994    Years since quitting: 28.4   Smokeless tobacco: Never  Vaping Use   Vaping Use: Never used  Substance and Sexual Activity   Alcohol use: No   Drug use: No   Sexual activity: Not Currently

## 2023-03-17 ENCOUNTER — Telehealth: Payer: Self-pay | Admitting: Orthopedic Surgery

## 2023-03-17 ENCOUNTER — Encounter (HOSPITAL_COMMUNITY): Payer: Self-pay | Admitting: Orthopedic Surgery

## 2023-03-17 ENCOUNTER — Other Ambulatory Visit: Payer: Self-pay

## 2023-03-17 NOTE — Telephone Encounter (Signed)
Pt's daughter [name] called requesting a call from Dr Lajoyce Corners. She is asking for Dr Lajoyce Corners to call her for final questions for surgery. [name] and pt want to move forward with surgery. Please call [name] at [phone].

## 2023-03-17 NOTE — Anesthesia Preprocedure Evaluation (Addendum)
Anesthesia Evaluation  Patient identified by MRN, date of birth, ID band Patient awake    Reviewed: Allergy & Precautions, NPO status , Patient's Chart, lab work & pertinent test results  History of Anesthesia Complications Negative for: history of anesthetic complications  Airway Mallampati: I  TM Distance: >3 FB Neck ROM: Full    Dental  (+) Edentulous Upper, Edentulous Lower   Pulmonary neg pulmonary ROS, former smoker   breath sounds clear to auscultation       Cardiovascular (-) angina + CAD (mild, non-obstructive by cath '21) and + Peripheral Vascular Disease  + dysrhythmias Atrial Fibrillation + Valvular Problems/Murmurs (s/p TAVR)  Rhythm:Regular Rate:Normal  '23 ECHO: EF 65-70%, normal LVF, mod LVH, Grade 1 DD, normal RVF, trivial MR, Aortic mean gradient 10 mmHg, s/p TAVR   Neuro/Psych   Anxiety Depression    negative neurological ROS     GI/Hepatic negative GI ROS, Neg liver ROS,,,  Endo/Other  Hypothyroidism    Renal/GU negative Renal ROS     Musculoskeletal  (+) Arthritis ,    Abdominal   Peds  Hematology   Anesthesia Other Findings   Reproductive/Obstetrics                              Anesthesia Physical Anesthesia Plan  ASA: 3  Anesthesia Plan: General   Post-op Pain Management: Tylenol PO (pre-op)*   Induction: Intravenous  PONV Risk Score and Plan: 3 and Ondansetron, Dexamethasone and Treatment may vary due to age or medical condition  Airway Management Planned: LMA  Additional Equipment: None  Intra-op Plan:   Post-operative Plan:   Informed Consent: I have reviewed the patients History and Physical, chart, labs and discussed the procedure including the risks, benefits and alternatives for the proposed anesthesia with the patient or authorized representative who has indicated his/her understanding and acceptance.     Dental advisory given  Plan  Discussed with: CRNA and Surgeon  Anesthesia Plan Comments: (See PAT note written 03/17/2023 by Shonna Chock, PA-C. For EKG on arrival.   )        Anesthesia Quick Evaluation

## 2023-03-17 NOTE — Progress Notes (Signed)
Surgical Instructions    Your procedure is scheduled on Friday March 18, 2023.  Report to [facility] Main Entrance "A" at [facility] then check in with the Admitting office.  Call this number if you have problems the morning of surgery:  [phone]   If you have any questions prior to your surgery date call [phone]: Open Monday-Friday 8am-4pm If you experience any cold or flu symptoms such as cough, fever, chills, shortness of breath, etc. between now and your scheduled surgery, please notify us at the above number    Remember:  Do not eat after midnight the night before your surgery  You may drink clear liquids until 11:30 the morning of your surgery.   Clear liquids allowed are: Water, Non-Citrus Juices (without pulp), Carbonated Beverages, Clear Tea, Black Coffee ONLY (NO MILK, CREAM OR POWDERED CREAMER of any kind), and Gatorade   Take these medicines the morning of surgery with A SIP OF WATER:  amoxicillin-clavulanate (AUGMENTIN)  doxycycline (VIBRA-TABS)  levothyroxine (SYNTHROID)  PARoxetine (PAXIL)   As needed:  acetaminophen (TYLENOL)    As of today, STOP taking any Aspirin (unless otherwise instructed by your surgeon) Aleve, Naproxen, Ibuprofen, Motrin, Advil, Goody's, BC's, all herbal medications, fish oil, and all vitamins.   Do not take your furosemide (LASIX) the day of surgery.    Do NOT Smoke (Tobacco/Vaping)  24 hours prior to your procedure  If you use a CPAP at night, you may bring your mask for your overnight stay.   Contacts, glasses, hearing aids, dentures or partials may not be worn into surgery, please bring cases for these belongings   For patients admitted to the hospital, discharge time will be determined by your treatment team.   Patients discharged the day of surgery will not be allowed to drive home, and someone needs to stay with them for 24 hours.   SURGICAL WAITING ROOM VISITATION Patients having surgery or a procedure may have no more  than 2 support people in the waiting area - these visitors may rotate.   Children under the age of 37 must have an adult with them who is not the patient. If the patient needs to stay at the hospital during part of their recovery, the visitor guidelines for inpatient rooms apply. Pre-op nurse will coordinate an appropriate time for 1 support person to accompany patient in pre-op.  This support person may not rotate.   Please refer to [email or web address] for the visitor guidelines for Inpatients (after your surgery is over and you are in a regular room).    Special instructions:    Oral Hygiene is also important to reduce your risk of infection.  Remember - BRUSH YOUR TEETH THE MORNING OF SURGERY WITH YOUR REGULAR TOOTHPASTE   [facility]- Preparing For Surgery   Please follow these instructions carefully.    Shower the [employer] BEFORE SURGERY with [employer].   Pat yourself dry with a CLEAN TOWEL.  Wear CLEAN PAJAMAS to bed the night before surgery  Place CLEAN SHEETS on your bed the night before your surgery  Day of Surgery:  Take a shower with Dial soap. Do not wear jewelry or makeup. Do not wear lotions, powders, perfumes or deodorant. Do not shave 48 hours prior to surgery.  Do not bring valuables to the hospital. Do not wear nail polish, gel polish, artificial nails, or any other type of covering on natural nails (fingers and toes) If you have artificial nails or gel coating that need to be removed  by a nail salon, please have this removed prior to surgery. Artificial nails or gel coating may interfere with anesthesia's ability to adequately monitor your vital signs.  [facility] is not responsible for any belongings or valuables.  Wear Clean/Comfortable clothing the morning of surgery  Remember to brush your teeth WITH YOUR REGULAR TOOTHPASTE.  If you received a COVID test during your pre-op visit, it is requested that you  wear a mask when out in public, stay away from anyone that may not be feeling well, and notify your surgeon if you develop symptoms. If you have been in contact with anyone that has tested positive in the last 10 days, please notify your surgeon.    Please read over the following fact sheets that you were given.

## 2023-03-17 NOTE — Progress Notes (Signed)
Anesthesia Chart Review: [name]  Case: [id number] Date/Time: 03/18/23 1415   Procedure: RIGHT ABOVE KNEE AMPUTATION (Right: Knee)   Anesthesia type: Choice   Pre-op diagnosis: Gangrene Right Foot   [facility] ROOM 06 / [facility] OR   Surgeons: [name], MD       DISCUSSION: Patient is an 89 year old female scheduled for the above procedure.   History includes former smoker, chronic bronchitis, HLD, CAD, AS (s/p TAVR 05/25/22), thyroidectomy (2011, post-surgical hypothyroidism), PVD, osteomyelitis (right foot). She had new afib on 03/04/23 EKG.   [facility] admission 03/01/23 - 03/09/23 for right foot cellulitis/osteomyelitis. Orthopedics and Vascular consulted, Vascular felt she had no revascularization targets and required BKA. Orthopedics discussed consideration of conservative treatment with antibiotics and wound care, but may ultimately require surgery. Family/patient opted for conservative therapy, and she was discharged to [facility] with antibiotics and wound care,. Of note, EKG done during that admission on 03/04/23 at 4:01 AM showed afib at 89 bpm. I do not see mention of findings in provider notes, but RN documented at least two episodes of afib followed by NSR on telemetry hours later. She had brief runs for PSVT/AT on ~05/2022 monitor, but I don't otherwise see a history of afib. She is not on anticoagulation therapy.   Last cardiology visit with Dr. Diona Browner was on 11/16/22.   She had orthopedic surgery follow-up on 03/16/23. Family/patient now agreeable to right AKA given ongoing infection with hip and knee contractures.   Reviewed new PAF episodes noted on EKG from recent admission with anesthesiologist Val Eagle, MD. Will repeat EKG on arrival for current baseline. Anesthesia team and/or surgeon can contact cardiology if  indicated, versus continue with out-patient follow-up. Message sent to Dr. Lajoyce Corners about this.    VS:  BP Readings from Last 3 Encounters:   03/09/23 (!) 123/50  01/08/23 (!) 159/65  11/16/22 (!) 144/86   Pulse Readings from Last 3 Encounters:  03/09/23 60  01/08/23 86  11/16/22 68    PROVIDERS: Assunta Found, MD is PCP  Nona Dell, MD is cardiologist Verne Carrow, MD is structural heart cardiologist   LABS: For day of surgery as indicated.  Last results in [facility] include: Lab Results  Component Value Date   WBC 8.0 03/02/2023   HGB 10.2 (L) 03/02/2023   HCT 32.5 (L) 03/02/2023   PLT 235 03/02/2023   GLUCOSE 87 03/02/2023   ALT 17 03/02/2023   AST 29 03/02/2023   NA 140 03/02/2023   K 3.5 03/02/2023   CL 102 03/02/2023   CREATININE 0.86 03/02/2023   BUN 16 03/02/2023   CO2 26 03/02/2023   INR 1.0 03/02/2023   HGBA1C 5.1 01/07/2023     EKG: EKG 03/04/23: Afib at 89 bpm. LBBB. She developed LBBB post-TAVR.  EKG 01/06/23: Sinus arrhythmia Ventricular premature complex Left bundle branch block No significant change since last tracing Confirmed by Lorre Nick ([id number]) on 01/06/2023 4:44:14 PM   CV: Echocardiogram 06/30/2022:  1. Left ventricular ejection fraction, by estimation, is 65 to 70%. The  left ventricle has normal function. The left ventricle has no regional  wall motion abnormalities. There is moderate asymmetric left ventricular  hypertrophy. Left ventricular  diastolic parameters are consistent with Grade I diastolic dysfunction  (impaired relaxation). Elevated left ventricular end-diastolic pressure.   2. Right ventricular systolic function is normal. The right ventricular  size is normal. There is normal pulmonary artery systolic pressure.   3. Left atrial size  was moderately dilated.   4. The mitral valve is grossly normal. Trivial mitral valve  regurgitation. No evidence of mitral stenosis. Moderate mitral annular  calcification.   5. Mean gradient improved since prior, was 20 mmHg on 05/26/22. The aortic  valve has been repaired/replaced. Aortic valve regurgitation is  not  visualized. There is a 26 mm Edwards Sapien prosthetic (TAVR) valve  present in the aortic position. Procedure  Date: 05/25/2022. Echo findings are consistent with normal structure and  function of the aortic valve prosthesis. Aortic valve mean gradient  measures 10.0 mmHg.   6. The inferior vena cava is normal in size with greater than 50%  respiratory variability, suggesting right atrial pressure of 3 mmHg.     Cardiac ZioAT monitor 05/26/22 - 06/09/22 Predominant rhythm is sinus with IVCD.  Heart rate ranged from 39 bpm up to 124 bpm and average heart rate 74 bpm. There were rare PACs including atrial couplets and triplets representing less than 1% total beats. There were rare PVCs representing less than 1% total beats.  Also limited episode of ventricular trigeminy. Multiple, brief episodes of PSVT were noted, the longest of which lasted 15 beats with average heart rate 114 bpm. There were no pauses. Patient triggered episodes and reported chest pain generally correlated with sinus rhythm.    US Carotid 07/09/20: Summary:  Right Carotid: Velocities in the right ICA are consistent with a 1-39%  stenosis.  Left Carotid: Velocities in the left ICA are consistent with a 1-39%  stenosis.  Vertebrals: Bilateral vertebral arteries demonstrate antegrade flow.  Subclavians: Normal flow hemodynamics were seen in bilateral subclavian               arteries.    Cardiac cath 07/04/20: Mid LAD lesion is 25% stenosed.   1. Mild non-obstructive CAD 2. Severe aortic stenosis (mean gradient 47.6 mmHg, peak to peak gradient 43 mmHg, AVA 0.89 cm2).      Past Medical History:  Diagnosis Date   Anxiety    Arthritis    CAD (coronary artery disease)    Mild at cardiac catheterization 2018   Chronic bronchitis    Constipation    Depression    Hyperlipidemia    Hypokalemia 02/12/2017   Hypothyroidism    S/P TAVR (transcatheter aortic valve replacement) 05/25/2022   26mm S3UR via TF  approach with Dr. Clifton James and Dr. Laneta Simmers   Severe aortic stenosis    Wound of left leg 01/07/2023    Past Surgical History:  Procedure Laterality Date   BUNIONECTOMY Left 1975   CARDIAC CATHETERIZATION  07/04/2020   CHOLECYSTECTOMY N/A 02/15/2017   Procedure: LAPAROSCOPIC CHOLECYSTECTOMY;  Surgeon: Axel Filler, MD;  Location: [facility] OR;  Service: General;  Laterality: N/A;   CORONARY ANGIOGRAPHY N/A 02/14/2017   Procedure: Coronary Angiography;  Surgeon: Yvonne Kendall, MD;  [facility] INVASIVE CV LAB;  Service: Cardiovascular;  Laterality: N/A;   EYE SURGERY  2009   bilateral   HEMATOMA EVACUATION  06/04/2011   Procedure: EVACUATION HEMATOMA;  Surgeon: Fuller Canada, MD;  Location: [facility] ORS;  Service: Orthopedics;  Laterality: Right;  evacuation of seroma   INTRAOPERATIVE TRANSTHORACIC ECHOCARDIOGRAM N/A 05/25/2022   Procedure: INTRAOPERATIVE TRANSTHORACIC ECHOCARDIOGRAM;  Surgeon: Kathleene Hazel, MD;  Location: [facility] INVASIVE CV LAB;  Service: Open Heart Surgery;  Laterality: N/A;   JOINT REPLACEMENT  05/2010   left total knee Dr. Romeo Apple   PATELLAR TENDON REPAIR  06/04/2011   Procedure: PATELLA TENDON REPAIR;  Surgeon: Fuller Canada, MD;  Location: [facility] ORS;  Service: Orthopedics;  Laterality: Right;   PATELLAR TENDON REPAIR  08/30/2011   Procedure: PATELLA TENDON REPAIR;  Surgeon: Fuller Canada, MD;  Location: [facility] ORS;  Service: Orthopedics;  Laterality: Right;  Allograft Reconstrucation Right Patella Tendon   RIGHT/LEFT HEART CATH AND CORONARY ANGIOGRAPHY N/A 07/04/2020   Procedure: RIGHT/LEFT HEART CATH AND CORONARY ANGIOGRAPHY;  Surgeon: Kathleene Hazel, MD;  [facility] INVASIVE CV LAB;  Service: Cardiovascular;  Laterality: N/A;   TOTAL HIP ARTHROPLASTY Left 1998   TOTAL KNEE ARTHROPLASTY  05/03/2011   Procedure: TOTAL KNEE ARTHROPLASTY;  Surgeon: Fuller Canada, MD;  Location: [facility] ORS;  Service: Orthopedics;  Laterality: Right;  With DePuy   TOTAL  THYROIDECTOMY  11/2009   Dr. Suszanne Conners @ [facility]   TRANSCATHETER AORTIC VALVE REPLACEMENT, TRANSFEMORAL Right 05/25/2022   Procedure: Transcatheter Aortic Valve Replacement, Transfemoral;  Surgeon: Kathleene Hazel, MD;  Location: [facility] INVASIVE CV LAB;  Service: Open Heart Surgery;  Laterality: Right;   TUBAL LIGATION      MEDICATIONS: No current facility-administered medications for this encounter.    acetaminophen (TYLENOL) 325 MG tablet   amoxicillin-clavulanate (AUGMENTIN) 875-125 MG tablet   doxycycline (VIBRA-TABS) 100 MG tablet   furosemide (LASIX) 40 MG tablet   ibuprofen (ADVIL) 200 MG tablet   levothyroxine (SYNTHROID) 150 MCG tablet   nitroGLYCERIN (NITROSTAT) 0.4 MG SL tablet   oxybutynin (DITROPAN XL) 15 MG 24 hr tablet   PARoxetine (PAXIL) 20 MG tablet     Shonna Chock, PA-C Surgical Short Stay/Anesthesiology [facility] Phone [phone] [facility] Phone [phone] 03/17/2023 7:09 PM

## 2023-03-17 NOTE — Telephone Encounter (Signed)
This pt was in the office for a visit with [name] yesterday right foot ulcer. Had discussion about amputation previously Pt's daughter would like to speak with you about surgery. See message below.

## 2023-03-17 NOTE — Progress Notes (Signed)
SDW call  Spoke with patient's daughter [name] at [phone].  The patient now resides at [facility].  [name] will be bringing patient to the hospital for surgery, instructed her on location and time of arrival.  Spoke with Ladona Ridgel, RN at [facility] [phone]) and reviewed all the pre-surgical information.  Paperwork was faxed to the facility at  [phone].     PCP - Dr. Assunta Found Cardiologist - Dr. Nona Dell Pulmonary:    PPM/ICD - denies  Chest x-ray - 05/19/2022 EKG -  03/04/2023 Stress Test - ECHO - 07/11/2022 Cardiac Cath - 07/04/2020 TAVR: 05/25/2022  Sleep Study/sleep apnea/CPAP: denies  Non-diabetic   Blood Thinner Instructions: denies Aspirin Instructions:denies   ERAS Protcol - Yes, clear fluids until 1130 PRE-SURGERY Ensure or G2-    COVID TEST- n/a    Anesthesia review: Yes. CAD, aortic stenosis, TAVR, heart cath   Patient denies shortness of breath, fever, cough and chest pain over the phone call  Your procedure is scheduled on Friday March 18, 2023  Report to [facility] Main Entrance "A" at [facility] then check in with the Admitting office.  Call this number if you have problems the morning of surgery:  [phone]   If you have any questions prior to your surgery date call [phone]: Open Monday-Friday 8am-4pm If you experience any cold or flu symptoms such as cough, fever, chills, shortness of breath, etc. between now and your scheduled surgery, please notify us at the above number    Remember:  Do not eat after midnight the night before your surgery  You may drink clear liquids until 1130 the morning of your surgery.   Clear liquids allowed are: Water, Non-Citrus Juices (without pulp), Carbonated Beverages, Clear Tea, Black Coffee ONLY (NO MILK, CREAM OR POWDERED CREAMER of any kind), and Gatorade   Take these medicines the morning of surgery with A SIP OF WATER:  Augmentin, doxycycline, levothyroxine,  paxil  As needed:  As of today, STOP taking any Aspirin (unless otherwise instructed by your surgeon) Aleve, Naproxen, Ibuprofen, Motrin, Advil, Goody's, BC's, all herbal medications, fish oil, and all vitamins.

## 2023-03-18 ENCOUNTER — Encounter (HOSPITAL_COMMUNITY): Payer: Self-pay | Admitting: Orthopedic Surgery

## 2023-03-18 ENCOUNTER — Other Ambulatory Visit: Payer: Self-pay | Admitting: *Deleted

## 2023-03-18 ENCOUNTER — Encounter (HOSPITAL_COMMUNITY)
Admission: RE | Disposition: A | Discharge status: Rehabilitation Facility | Payer: Self-pay | Source: Skilled Nursing Facility | Attending: Orthopedic Surgery

## 2023-03-18 ENCOUNTER — Other Ambulatory Visit: Payer: Self-pay

## 2023-03-18 ENCOUNTER — Inpatient Hospital Stay (HOSPITAL_COMMUNITY)
Admission: RE | Admit: 2023-03-18 | Discharge: 2023-03-21 | DRG: 240 | Disposition: A | Discharge status: Rehabilitation Facility | Payer: 59 | Source: Skilled Nursing Facility | Attending: Orthopedic Surgery | Admitting: Orthopedic Surgery

## 2023-03-18 ENCOUNTER — Inpatient Hospital Stay (HOSPITAL_COMMUNITY): Payer: 59 | Admitting: Vascular Surgery

## 2023-03-18 DIAGNOSIS — I96 Gangrene, not elsewhere classified: Secondary | ICD-10-CM | POA: Diagnosis not present

## 2023-03-18 DIAGNOSIS — Z79890 Hormone replacement therapy (postmenopausal): Secondary | ICD-10-CM | POA: Diagnosis not present

## 2023-03-18 DIAGNOSIS — I70261 Atherosclerosis of native arteries of extremities with gangrene, right leg: Principal | ICD-10-CM | POA: Diagnosis present

## 2023-03-18 DIAGNOSIS — E785 Hyperlipidemia, unspecified: Secondary | ICD-10-CM | POA: Diagnosis present

## 2023-03-18 DIAGNOSIS — I4891 Unspecified atrial fibrillation: Secondary | ICD-10-CM

## 2023-03-18 DIAGNOSIS — M24561 Contracture, right knee: Secondary | ICD-10-CM | POA: Diagnosis not present

## 2023-03-18 DIAGNOSIS — M24551 Contracture, right hip: Secondary | ICD-10-CM | POA: Diagnosis not present

## 2023-03-18 DIAGNOSIS — L97919 Non-pressure chronic ulcer of unspecified part of right lower leg with unspecified severity: Secondary | ICD-10-CM | POA: Diagnosis not present

## 2023-03-18 DIAGNOSIS — Z96653 Presence of artificial knee joint, bilateral: Secondary | ICD-10-CM | POA: Diagnosis present

## 2023-03-18 DIAGNOSIS — I10 Essential (primary) hypertension: Secondary | ICD-10-CM | POA: Diagnosis not present

## 2023-03-18 DIAGNOSIS — Z952 Presence of prosthetic heart valve: Secondary | ICD-10-CM

## 2023-03-18 DIAGNOSIS — J449 Chronic obstructive pulmonary disease, unspecified: Secondary | ICD-10-CM | POA: Diagnosis not present

## 2023-03-18 DIAGNOSIS — E038 Other specified hypothyroidism: Secondary | ICD-10-CM | POA: Diagnosis not present

## 2023-03-18 DIAGNOSIS — Z7401 Bed confinement status: Secondary | ICD-10-CM | POA: Diagnosis not present

## 2023-03-18 DIAGNOSIS — Z96642 Presence of left artificial hip joint: Secondary | ICD-10-CM | POA: Diagnosis not present

## 2023-03-18 DIAGNOSIS — I739 Peripheral vascular disease, unspecified: Secondary | ICD-10-CM | POA: Diagnosis not present

## 2023-03-18 DIAGNOSIS — E039 Hypothyroidism, unspecified: Secondary | ICD-10-CM | POA: Diagnosis not present

## 2023-03-18 DIAGNOSIS — R531 Weakness: Secondary | ICD-10-CM | POA: Diagnosis not present

## 2023-03-18 DIAGNOSIS — I2510 Atherosclerotic heart disease of native coronary artery without angina pectoris: Secondary | ICD-10-CM | POA: Diagnosis not present

## 2023-03-18 DIAGNOSIS — Z4781 Encounter for orthopedic aftercare following surgical amputation: Secondary | ICD-10-CM | POA: Diagnosis not present

## 2023-03-18 DIAGNOSIS — Z821 Family history of blindness and visual loss: Secondary | ICD-10-CM | POA: Diagnosis not present

## 2023-03-18 DIAGNOSIS — Z743 Need for continuous supervision: Secondary | ICD-10-CM | POA: Diagnosis not present

## 2023-03-18 DIAGNOSIS — M86671 Other chronic osteomyelitis, right ankle and foot: Secondary | ICD-10-CM | POA: Diagnosis not present

## 2023-03-18 DIAGNOSIS — M13169 Monoarthritis, not elsewhere classified, unspecified knee: Secondary | ICD-10-CM | POA: Diagnosis not present

## 2023-03-18 DIAGNOSIS — S88011A Complete traumatic amputation at knee level, right lower leg, initial encounter: Secondary | ICD-10-CM | POA: Diagnosis not present

## 2023-03-18 DIAGNOSIS — Z79899 Other long term (current) drug therapy: Secondary | ICD-10-CM | POA: Diagnosis not present

## 2023-03-18 DIAGNOSIS — E890 Postprocedural hypothyroidism: Secondary | ICD-10-CM | POA: Diagnosis not present

## 2023-03-18 DIAGNOSIS — L97519 Non-pressure chronic ulcer of other part of right foot with unspecified severity: Secondary | ICD-10-CM | POA: Diagnosis not present

## 2023-03-18 DIAGNOSIS — N178 Other acute kidney failure: Secondary | ICD-10-CM | POA: Diagnosis not present

## 2023-03-18 DIAGNOSIS — I70201 Unspecified atherosclerosis of native arteries of extremities, right leg: Secondary | ICD-10-CM | POA: Diagnosis not present

## 2023-03-18 DIAGNOSIS — Z89611 Acquired absence of right leg above knee: Secondary | ICD-10-CM | POA: Diagnosis not present

## 2023-03-18 DIAGNOSIS — R2689 Other abnormalities of gait and mobility: Secondary | ICD-10-CM | POA: Diagnosis not present

## 2023-03-18 DIAGNOSIS — Z87891 Personal history of nicotine dependence: Secondary | ICD-10-CM | POA: Diagnosis not present

## 2023-03-18 DIAGNOSIS — M6281 Muscle weakness (generalized): Secondary | ICD-10-CM | POA: Diagnosis not present

## 2023-03-18 HISTORY — PX: AMPUTATION: SHX166

## 2023-03-18 HISTORY — DX: Cardiac arrhythmia, unspecified: I49.9

## 2023-03-18 HISTORY — PX: APPLICATION OF WOUND VAC: SHX5189

## 2023-03-18 SURGERY — AMPUTATION, ABOVE KNEE
Anesthesia: General | Site: Knee | Laterality: Right

## 2023-03-18 MED ORDER — ONDANSETRON HCL 4 MG/2ML IJ SOLN
4.0000 mg | Freq: Four times a day (QID) | INTRAMUSCULAR | Status: DC | PRN
  Administered 2023-03-19: 4 mg via INTRAVENOUS
  Filled 2023-03-18: qty 2

## 2023-03-18 MED ORDER — POTASSIUM CHLORIDE CRYS ER 20 MEQ PO TBCR: 20.0000 meq | EXTENDED_RELEASE_TABLET | Freq: Every day | ORAL | Status: DC | PRN

## 2023-03-18 MED ORDER — MIDAZOLAM HCL 2 MG/2ML IJ SOLN: 0.5000 mg | Freq: Once | INTRAMUSCULAR | Status: DC | PRN

## 2023-03-18 MED ORDER — LEVOTHYROXINE SODIUM 75 MCG PO TABS
150.0000 ug | ORAL_TABLET | Freq: Every day | ORAL | Status: DC
  Administered 2023-03-19 – 2023-03-21 (×3): 150 ug via ORAL
  Filled 2023-03-18 (×3): qty 2

## 2023-03-18 MED ORDER — BISACODYL 5 MG PO TBEC: 5.0000 mg | DELAYED_RELEASE_TABLET | Freq: Every day | ORAL | Status: DC | PRN

## 2023-03-18 MED ORDER — ALUM & MAG HYDROXIDE-SIMETH 200-200-20 MG/5ML PO SUSP: 15.0000 mL | ORAL | Status: DC | PRN

## 2023-03-18 MED ORDER — FENTANYL CITRATE (PF) 100 MCG/2ML IJ SOLN
INTRAMUSCULAR | Status: AC
  Filled 2023-03-18: qty 2

## 2023-03-18 MED ORDER — CEFAZOLIN SODIUM-DEXTROSE 2-4 GM/100ML-% IV SOLN
2.0000 g | Freq: Three times a day (TID) | INTRAVENOUS | Status: AC
  Administered 2023-03-18 – 2023-03-19 (×2): 2 g via INTRAVENOUS
  Filled 2023-03-18 (×2): qty 100

## 2023-03-18 MED ORDER — MEPERIDINE HCL 25 MG/ML IJ SOLN: 6.2500 mg | INTRAMUSCULAR | Status: DC | PRN

## 2023-03-18 MED ORDER — LABETALOL HCL 5 MG/ML IV SOLN: 10.0000 mg | INTRAVENOUS | Status: DC | PRN

## 2023-03-18 MED ORDER — PHENYLEPHRINE HCL-NACL 20-0.9 MG/250ML-% IV SOLN
INTRAVENOUS | Status: DC | PRN
  Administered 2023-03-18: 40 ug/min via INTRAVENOUS

## 2023-03-18 MED ORDER — ZINC SULFATE 220 (50 ZN) MG PO CAPS
220.0000 mg | ORAL_CAPSULE | Freq: Every day | ORAL | Status: DC
  Administered 2023-03-18 – 2023-03-21 (×4): 220 mg via ORAL
  Filled 2023-03-18 (×4): qty 1

## 2023-03-18 MED ORDER — GLYCOPYRROLATE PF 0.2 MG/ML IJ SOSY
PREFILLED_SYRINGE | INTRAMUSCULAR | Status: DC | PRN
  Administered 2023-03-18: .1 mg via INTRAVENOUS

## 2023-03-18 MED ORDER — ACETAMINOPHEN 325 MG PO TABS: 325.0000 mg | ORAL_TABLET | Freq: Four times a day (QID) | ORAL | Status: DC | PRN

## 2023-03-18 MED ORDER — ACETAMINOPHEN 10 MG/ML IV SOLN
INTRAVENOUS | Status: AC
  Filled 2023-03-18: qty 100

## 2023-03-18 MED ORDER — DEXAMETHASONE SODIUM PHOSPHATE 10 MG/ML IJ SOLN
INTRAMUSCULAR | Status: DC | PRN
  Administered 2023-03-18: 4 mg via INTRAVENOUS

## 2023-03-18 MED ORDER — DOCUSATE SODIUM 100 MG PO CAPS
100.0000 mg | ORAL_CAPSULE | Freq: Every day | ORAL | Status: DC
  Administered 2023-03-19 – 2023-03-21 (×3): 100 mg via ORAL
  Filled 2023-03-18 (×3): qty 1

## 2023-03-18 MED ORDER — TRANEXAMIC ACID-NACL 1000-0.7 MG/100ML-% IV SOLN
1000.0000 mg | INTRAVENOUS | Status: AC
  Administered 2023-03-18: 1000 mg via INTRAVENOUS
  Filled 2023-03-18: qty 100

## 2023-03-18 MED ORDER — PHENOL 1.4 % MT LIQD: 1.0000 | OROMUCOSAL | Status: DC | PRN

## 2023-03-18 MED ORDER — ONDANSETRON HCL 4 MG/2ML IJ SOLN
INTRAMUSCULAR | Status: DC | PRN
  Administered 2023-03-18: 4 mg via INTRAVENOUS

## 2023-03-18 MED ORDER — ONDANSETRON HCL 4 MG/2ML IJ SOLN
INTRAMUSCULAR | Status: AC
  Filled 2023-03-18: qty 2

## 2023-03-18 MED ORDER — POLYETHYLENE GLYCOL 3350 17 G PO PACK: 17.0000 g | PACK | Freq: Every day | ORAL | Status: DC | PRN

## 2023-03-18 MED ORDER — FENTANYL CITRATE (PF) 250 MCG/5ML IJ SOLN
INTRAMUSCULAR | Status: AC
  Filled 2023-03-18: qty 5

## 2023-03-18 MED ORDER — FENTANYL CITRATE (PF) 250 MCG/5ML IJ SOLN
INTRAMUSCULAR | Status: DC | PRN
  Administered 2023-03-18 (×4): 25 ug via INTRAVENOUS

## 2023-03-18 MED ORDER — OXYCODONE HCL 5 MG/5ML PO SOLN: 5.0000 mg | Freq: Once | ORAL | Status: DC | PRN

## 2023-03-18 MED ORDER — DEXAMETHASONE SODIUM PHOSPHATE 10 MG/ML IJ SOLN
INTRAMUSCULAR | Status: AC
  Filled 2023-03-18: qty 1

## 2023-03-18 MED ORDER — GLYCOPYRROLATE PF 0.2 MG/ML IJ SOSY
PREFILLED_SYRINGE | INTRAMUSCULAR | Status: AC
  Filled 2023-03-18: qty 1

## 2023-03-18 MED ORDER — SODIUM CHLORIDE 0.9 % IV SOLN: INTRAVENOUS | Status: DC

## 2023-03-18 MED ORDER — PROMETHAZINE HCL 25 MG/ML IJ SOLN: 6.2500 mg | INTRAMUSCULAR | Status: DC | PRN

## 2023-03-18 MED ORDER — JUVEN PO PACK
1.0000 | PACK | Freq: Two times a day (BID) | ORAL | Status: DC
  Administered 2023-03-19 – 2023-03-21 (×5): 1 via ORAL
  Filled 2023-03-18 (×5): qty 1

## 2023-03-18 MED ORDER — OXYCODONE HCL 5 MG PO TABS: 5.0000 mg | ORAL_TABLET | Freq: Once | ORAL | Status: DC | PRN

## 2023-03-18 MED ORDER — PROPOFOL 10 MG/ML IV BOLUS
INTRAVENOUS | Status: DC | PRN
  Administered 2023-03-18 (×2): 30 mg via INTRAVENOUS
  Administered 2023-03-18: 100 mg via INTRAVENOUS

## 2023-03-18 MED ORDER — VITAMIN C 500 MG PO TABS
1000.0000 mg | ORAL_TABLET | Freq: Every day | ORAL | Status: DC
  Administered 2023-03-18 – 2023-03-21 (×4): 1000 mg via ORAL
  Filled 2023-03-18 (×4): qty 2

## 2023-03-18 MED ORDER — PAROXETINE HCL 20 MG PO TABS
20.0000 mg | ORAL_TABLET | Freq: Every day | ORAL | Status: DC
  Administered 2023-03-19 – 2023-03-21 (×3): 20 mg via ORAL
  Filled 2023-03-18 (×3): qty 1

## 2023-03-18 MED ORDER — HYDROMORPHONE HCL 1 MG/ML IJ SOLN
INTRAMUSCULAR | Status: AC
  Filled 2023-03-18: qty 1

## 2023-03-18 MED ORDER — HYDRALAZINE HCL 20 MG/ML IJ SOLN: 5.0000 mg | INTRAMUSCULAR | Status: DC | PRN

## 2023-03-18 MED ORDER — EPHEDRINE 5 MG/ML INJ
INTRAVENOUS | Status: AC
  Filled 2023-03-18: qty 5

## 2023-03-18 MED ORDER — GUAIFENESIN-DM 100-10 MG/5ML PO SYRP: 15.0000 mL | ORAL_SOLUTION | ORAL | Status: DC | PRN

## 2023-03-18 MED ORDER — HYDROMORPHONE HCL 1 MG/ML IJ SOLN
0.2500 mg | INTRAMUSCULAR | Status: DC | PRN
  Administered 2023-03-18 (×3): 0.5 mg via INTRAVENOUS

## 2023-03-18 MED ORDER — CEFAZOLIN SODIUM-DEXTROSE 2-4 GM/100ML-% IV SOLN
2.0000 g | INTRAVENOUS | Status: AC
  Administered 2023-03-18: 2 g via INTRAVENOUS
  Filled 2023-03-18: qty 100

## 2023-03-18 MED ORDER — PANTOPRAZOLE SODIUM 40 MG PO TBEC
40.0000 mg | DELAYED_RELEASE_TABLET | Freq: Every day | ORAL | Status: DC
  Administered 2023-03-18 – 2023-03-21 (×4): 40 mg via ORAL
  Filled 2023-03-18 (×4): qty 1

## 2023-03-18 MED ORDER — MAGNESIUM SULFATE 2 GM/50ML IV SOLN: 2.0000 g | Freq: Every day | INTRAVENOUS | Status: DC | PRN

## 2023-03-18 MED ORDER — ORAL CARE MOUTH RINSE: 15.0000 mL | Freq: Once | OROMUCOSAL | Status: AC

## 2023-03-18 MED ORDER — MAGNESIUM CITRATE PO SOLN: 1.0000 | Freq: Once | ORAL | Status: DC | PRN

## 2023-03-18 MED ORDER — CHLORHEXIDINE GLUCONATE 0.12 % MT SOLN
OROMUCOSAL | Status: AC
  Administered 2023-03-18: 15 mL via OROMUCOSAL
  Filled 2023-03-18: qty 15

## 2023-03-18 MED ORDER — MORPHINE SULFATE (PF) 2 MG/ML IV SOLN
0.5000 mg | INTRAVENOUS | Status: DC | PRN
  Administered 2023-03-19 – 2023-03-21 (×6): 1 mg via INTRAVENOUS
  Filled 2023-03-18 (×6): qty 1

## 2023-03-18 MED ORDER — PHENYLEPHRINE 80 MCG/ML (10ML) SYRINGE FOR IV PUSH (FOR BLOOD PRESSURE SUPPORT)
PREFILLED_SYRINGE | INTRAVENOUS | Status: DC | PRN
  Administered 2023-03-18: 80 ug via INTRAVENOUS
  Administered 2023-03-18: 120 ug via INTRAVENOUS
  Administered 2023-03-18: 160 ug via INTRAVENOUS

## 2023-03-18 MED ORDER — LACTATED RINGERS IV SOLN: INTRAVENOUS | Status: DC

## 2023-03-18 MED ORDER — OXYBUTYNIN CHLORIDE ER 5 MG PO TB24
15.0000 mg | ORAL_TABLET | Freq: Every day | ORAL | Status: DC
  Administered 2023-03-19 – 2023-03-21 (×3): 15 mg via ORAL
  Filled 2023-03-18 (×3): qty 1

## 2023-03-18 MED ORDER — EPHEDRINE SULFATE-NACL 50-0.9 MG/10ML-% IV SOSY
PREFILLED_SYRINGE | INTRAVENOUS | Status: DC | PRN
  Administered 2023-03-18 (×2): 2.5 mg via INTRAVENOUS

## 2023-03-18 MED ORDER — HYDROCODONE-ACETAMINOPHEN 7.5-325 MG PO TABS
1.0000 | ORAL_TABLET | ORAL | Status: DC | PRN
  Administered 2023-03-18: 1 via ORAL
  Administered 2023-03-20: 2 via ORAL
  Administered 2023-03-20 – 2023-03-21 (×4): 1 via ORAL
  Filled 2023-03-18 (×5): qty 1
  Filled 2023-03-18: qty 2

## 2023-03-18 MED ORDER — TRANEXAMIC ACID 1000 MG/10ML IV SOLN
2000.0000 mg | INTRAVENOUS | Status: DC
  Filled 2023-03-18: qty 20

## 2023-03-18 MED ORDER — ACETAMINOPHEN 10 MG/ML IV SOLN
INTRAVENOUS | Status: DC | PRN
  Administered 2023-03-18: 1000 mg via INTRAVENOUS

## 2023-03-18 MED ORDER — HYDROCODONE-ACETAMINOPHEN 5-325 MG PO TABS
1.0000 | ORAL_TABLET | ORAL | Status: DC | PRN
  Administered 2023-03-19: 2 via ORAL
  Administered 2023-03-19 – 2023-03-20 (×2): 1 via ORAL
  Filled 2023-03-18: qty 1
  Filled 2023-03-18: qty 2
  Filled 2023-03-18: qty 1

## 2023-03-18 MED ORDER — LIDOCAINE 2% (20 MG/ML) 5 ML SYRINGE
INTRAMUSCULAR | Status: DC | PRN
  Administered 2023-03-18: 40 mg via INTRAVENOUS

## 2023-03-18 MED ORDER — LIDOCAINE 2% (20 MG/ML) 5 ML SYRINGE
INTRAMUSCULAR | Status: AC
  Filled 2023-03-18: qty 5

## 2023-03-18 MED ORDER — METOPROLOL TARTRATE 5 MG/5ML IV SOLN: 2.0000 mg | INTRAVENOUS | Status: DC | PRN

## 2023-03-18 MED ORDER — 0.9 % SODIUM CHLORIDE (POUR BTL) OPTIME
TOPICAL | Status: DC | PRN
  Administered 2023-03-18: 1000 mL

## 2023-03-18 MED ORDER — CHLORHEXIDINE GLUCONATE 0.12 % MT SOLN: 15.0000 mL | Freq: Once | OROMUCOSAL | Status: AC

## 2023-03-18 SURGICAL SUPPLY — 44 items
BAG COUNTER SPONGE SURGICOUNT (BAG) IMPLANT
BAG SPNG CNTER NS LX DISP (BAG)
BLADE SAW RECIP 87.9 MT (BLADE) ×3 IMPLANT
BLADE SURG 21 STRL SS (BLADE) ×3 IMPLANT
BNDG CMPR 5X6 CHSV STRCH STRL (GAUZE/BANDAGES/DRESSINGS) ×2
BNDG COHESIVE 6X5 TAN ST LF (GAUZE/BANDAGES/DRESSINGS) ×3 IMPLANT
CANISTER WOUND CARE 500ML ATS (WOUND CARE) IMPLANT
COVER SURGICAL LIGHT HANDLE (MISCELLANEOUS) ×3 IMPLANT
CUFF TOURN SGL QUICK 34 (TOURNIQUET CUFF)
CUFF TRNQT CYL 34X4.125X (TOURNIQUET CUFF) IMPLANT
DRAPE DERMATAC (DRAPES) IMPLANT
DRAPE INCISE IOBAN 66X45 STRL (DRAPES) ×6 IMPLANT
DRAPE U-SHAPE 47X51 STRL (DRAPES) ×3 IMPLANT
DRESSING PREVENA PLUS CUSTOM (GAUZE/BANDAGES/DRESSINGS) ×3 IMPLANT
DRSG PREVENA PLUS CUSTOM (GAUZE/BANDAGES/DRESSINGS) ×2
DURAPREP 26ML APPLICATOR (WOUND CARE) ×3 IMPLANT
ELECT REM PT RETURN 9FT ADLT (ELECTROSURGICAL) ×2
ELECTRODE REM PT RTRN 9FT ADLT (ELECTROSURGICAL) ×3 IMPLANT
GLOVE BIOGEL PI IND STRL 7.5 (GLOVE) ×3 IMPLANT
GLOVE BIOGEL PI IND STRL 9 (GLOVE) ×3 IMPLANT
GLOVE SURG ORTHO 9.0 STRL STRW (GLOVE) ×3 IMPLANT
GLOVE SURG SS PI 6.5 STRL IVOR (GLOVE) ×3 IMPLANT
GOWN STRL REUS W/ TWL LRG LVL3 (GOWN DISPOSABLE) ×3 IMPLANT
GOWN STRL REUS W/ TWL XL LVL3 (GOWN DISPOSABLE) ×6 IMPLANT
GOWN STRL REUS W/TWL LRG LVL3 (GOWN DISPOSABLE) ×2
GOWN STRL REUS W/TWL XL LVL3 (GOWN DISPOSABLE) ×4
GRAFT SKIN WND MICRO 38 (Tissue) IMPLANT
KIT BASIN OR (CUSTOM PROCEDURE TRAY) ×3 IMPLANT
KIT TURNOVER KIT B (KITS) ×3 IMPLANT
MANIFOLD NEPTUNE II (INSTRUMENTS) ×3 IMPLANT
NS IRRIG 1000ML POUR BTL (IV SOLUTION) ×3 IMPLANT
PACK ORTHO EXTREMITY (CUSTOM PROCEDURE TRAY) ×3 IMPLANT
PAD ARMBOARD 7.5X6 YLW CONV (MISCELLANEOUS) ×3 IMPLANT
PREVENA RESTOR ARTHOFORM 46X30 (CANNISTER) ×3 IMPLANT
STAPLER VISISTAT 35W (STAPLE) IMPLANT
STOCKINETTE IMPERVIOUS LG (DRAPES) IMPLANT
SUT ETHILON 2 0 PSLX (SUTURE) ×6 IMPLANT
SUT SILK 2 0 (SUTURE) ×2
SUT SILK 2-0 18XBRD TIE 12 (SUTURE) ×3 IMPLANT
SUT VIC AB 1 CTX 36 (SUTURE) ×2
SUT VIC AB 1 CTX36XBRD ANBCTRL (SUTURE) IMPLANT
TOWEL GREEN STERILE FF (TOWEL DISPOSABLE) ×3 IMPLANT
TUBE CONNECTING 20X1/4 (TUBING) ×3 IMPLANT
YANKAUER SUCT BULB TIP NO VENT (SUCTIONS) ×3 IMPLANT

## 2023-03-18 NOTE — H&P (Signed)
[name] is an 89 y.o. female.   Chief Complaint: Gangrenous ulceration right foot HPI: The patient is an 89 year old woman seen for a concern of osteomyelitis of the right foot following hospitalization for same from 6/4-6/12. Discussed amputation with patient and family during hospitalization, they opted for conservative management. Today patient is open to limb salvage surgery.   Past Medical History:  Diagnosis Date   Anxiety    Arthritis    CAD (coronary artery disease)    Mild at cardiac catheterization 2018   Chronic bronchitis    Constipation    Depression    Dysrhythmia    afib 03/04/23   Hyperlipidemia    Hypokalemia 02/12/2017   Hypothyroidism    S/P TAVR (transcatheter aortic valve replacement) 05/25/2022   26mm S3UR via TF approach with Dr. Clifton James and Dr. Laneta Simmers   Severe aortic stenosis    Wound of left leg 01/07/2023    Past Surgical History:  Procedure Laterality Date   BUNIONECTOMY Left 1975   CARDIAC CATHETERIZATION  07/04/2020   CHOLECYSTECTOMY N/A 02/15/2017   Procedure: LAPAROSCOPIC CHOLECYSTECTOMY;  Surgeon: Axel Filler, MD;  Location: [facility] OR;  Service: General;  Laterality: N/A;   CORONARY ANGIOGRAPHY N/A 02/14/2017   Procedure: Coronary Angiography;  Surgeon: Yvonne Kendall, MD;  [facility] INVASIVE CV LAB;  Service: Cardiovascular;  Laterality: N/A;   EYE SURGERY  2009   bilateral   HEMATOMA EVACUATION  06/04/2011   Procedure: EVACUATION HEMATOMA;  Surgeon: Fuller Canada, MD;  Location: [facility] ORS;  Service: Orthopedics;  Laterality: Right;  evacuation of seroma   INTRAOPERATIVE TRANSTHORACIC ECHOCARDIOGRAM N/A 05/25/2022   Procedure: INTRAOPERATIVE TRANSTHORACIC ECHOCARDIOGRAM;  Surgeon: Kathleene Hazel, MD;  [facility] INVAS[facility] CV LAB;  Service: Open Heart Surgery;  Laterality: N/A;   JOINT REPLACEMENT  05/2010   left total knee Dr. Romeo Apple   PATELLAR TENDON REPAIR  06/04/2011   Procedure: PATELLA TENDON REPAIR;  Surgeon:  Fuller Canada, MD;  Location: [facility] ORS;  Service: Orthopedics;  Laterality: Right;   PATELLAR TENDON REPAIR  08/30/2011   Procedure: PATELLA TENDON REPAIR;  Surgeon: Fuller Canada, MD;  Location: [facility] ORS;  Service: Orthopedics;  Laterality: Right;  Allograft Reconstrucation Right Patella Tendon   RIGHT/LEFT HEART CATH AND CORONARY ANGIOGRAPHY N/A 07/04/2020   Procedure: RIGHT/LEFT HEART CATH AND CORONARY ANGIOGRAPHY;  Surgeon: Kathleene Hazel, MD;  [facility] INVAS[facility] CV LAB;  Service: Cardiovascular;  Laterality: N/A;   TOTAL HIP ARTHROPLASTY Left 1998   TOTAL KNEE ARTHROPLASTY  05/03/2011   Procedure: TOTAL KNEE ARTHROPLASTY;  Surgeon: Fuller Canada, MD;  Location: [facility] ORS;  Service: Orthopedics;  Laterality: Right;  With DePuy   TOTAL THYROIDECTOMY  11/2009   Dr. Suszanne Conners @ [facility]   TRANSCATHETER AORTIC VALVE REPLACEMENT, TRANSFEMORAL Right 05/25/2022   Procedure: Transcatheter Aortic Valve Replacement, Transfemoral;  Surgeon: Kathleene Hazel, MD;  Location: [facility] INVASIVE CV LAB;  Service: Open Heart Surgery;  Laterality: Right;   TUBAL LIGATION      Family History  Problem Relation Age of Onset   Colon cancer Mother    Blindness Sister    Anesthesia problems Neg Hx    Hypotension Neg Hx    Malignant hyperthermia Neg Hx    Pseudochol deficiency Neg Hx    Social History:  reports that she quit smoking about 28 years ago. Her smoking use included cigarettes. She has never used smokeless tobacco. She reports that she does not drink alcohol and does not use drugs.  Allergies: No Known Allergies  No medications prior to admission.    No results found for this or any previous visit (from the past 48 hour(s)). No results found.  Review of Systems  All other systems reviewed and are negative.   There were no vitals taken for this visit. Physical Exam  Examination patient has flexion contractures of the right hip and right knee.  She has ulcerative gangrenous changes  to the right foot. Assessment/Plan 1. Osteomyelitis of toe of right foot (HCC)       Plan: Osteomyelitis of the proximal, middle, and distal phalanges of right third and fourth toes with cellulitis: given instructions to continue the oral augmentin and doxy. Continue daily wound care.  Have discussed conservative management and associated risks. Due to hip and knee contractures AKA would be best option. Have offered AKA.  [name], MD 03/18/2023, 6:38 AM

## 2023-03-18 NOTE — Op Note (Signed)
03/18/2023  4:48 PM  PATIENT:  [name]    PRE-OPERATIVE DIAGNOSIS:  Gangrene Right Foot  POST-OPERATIVE DIAGNOSIS:  Same  PROCEDURE:  RIGHT ABOVE KNEE AMPUTATION, APPLICATION OF WOUND VAC Application Kerecis micro graft 38 cm.  SURGEON:  [name], MD  PHYSICIAN ASSISTANT:None ANESTHESIA:   General  PREOPERATIVE INDICATIONS:  [name] is a  89 y.o. female with a diagnosis of Gangrene Right Foot who failed conservative measures and elected for surgical management.    The risks benefits and alternatives were discussed with the patient preoperatively including but not limited to the risks of infection, bleeding, nerve injury, cardiopulmonary complications, the need for revision surgery, among others, and the patient was willing to proceed.  OPERATIVE IMPLANTS:   Implant Name Type Inv. Item Serial No. Manufacturer Lot No. LRB No. Used Action  GRAFT SKIN WND MICRO 38 - [id number] Tissue GRAFT SKIN WND MICRO 38  [employer] [id number] Right 1 Implanted    @ENCIMAGES @  OPERATIVE FINDINGS: Muscle did not have good color or consistency.  There was contractility.  OPERATIVE PROCEDURE: Patient brought the operating room and underwent a general anesthetic.  After adequate levels anesthesia were obtained patient's right lower extremity was prepped using DuraPrep draped into a sterile field a timeout was called.  The foot was draped out of the sterile field with impervious stockinette.  A fishmouth incision was made just proximal to the total knee arthroplasty.  This was carried down to the neurovascular bundle medially this was clamped and suture-ligated with 2-0 silk.  A reciprocating saw was used to complete the amputation through the femur.  The amputation was completed posteriorly.  Electrocautery was used hemostasis.  The wound was irrigated with normal saline.  The deep and superficial fascial layers were closed using #1 Vicryl.  The skin with was closed with 2-0  nylon and staples.  The wound was covered with a TEFL teacher and Merton Border form wound VAC dressing.  This had a good suction fit patient was extubated taken the PACU in stable condition.   DISCHARGE PLANNING:  Antibiotic duration: 24 hours antibiotics  Weightbearing: Transfer training patient has fixed flexion deformities of the bilateral lower extremities.  Pain medication: Low-dose opioid pathway  Dressing care/ Wound VAC: Continue wound VAC  Ambulatory devices: Wheelchair for ambulation  Discharge to: Discharge back to skilled nursing.  Follow-up: In the office 1 week post operative.

## 2023-03-18 NOTE — Patient Outreach (Signed)
Per [facility] Mrs. Meenan resides in [facility] skilled nursing facility. Mrs. Killman was active with care coordination team prior.   Secure communication sent to [facility] social worker to inquire about transition plans/needs.   Will continue to follow.   Raiford Noble, MSN, RN,BSN [facility] Post Acute Care Coordinator [phone] (Direct dial)

## 2023-03-18 NOTE — Anesthesia Postprocedure Evaluation (Signed)
Anesthesia Post Note  Patient: [name]  Procedure(s) Performed: RIGHT ABOVE KNEE AMPUTATION (Right: Knee) APPLICATION OF WOUND VAC (Right)     Patient location during evaluation: PACU Anesthesia Type: General Level of consciousness: awake and alert Pain management: pain level controlled Vital Signs Assessment: post-procedure vital signs reviewed and stable Respiratory status: spontaneous breathing, nonlabored ventilation, respiratory function stable and patient connected to nasal cannula oxygen Cardiovascular status: blood pressure returned to baseline and stable Postop Assessment: no apparent nausea or vomiting Anesthetic complications: no   No notable events documented.  Last Vitals:  Vitals:   03/18/23 1715 03/18/23 1730  BP: (!) 165/64 95/82  Pulse: 85 86  Resp: 14 16  Temp:    SpO2: 100% 99%    Last Pain:  Vitals:   03/18/23 1730  TempSrc:   PainSc: Asleep                 [name] S

## 2023-03-18 NOTE — Transfer of Care (Signed)
Immediate Anesthesia Transfer of Care Note  Patient: [name]  Procedure(s) Performed: RIGHT ABOVE KNEE AMPUTATION (Right: Knee) APPLICATION OF WOUND VAC (Right)  Patient Location: PACU  Anesthesia Type:General  Level of Consciousness: drowsy and patient cooperative  Airway & Oxygen Therapy: Patient Spontanous Breathing and Patient connected to face mask oxygen  Post-op Assessment: Report given to RN and Post -op Vital signs reviewed and stable  Post vital signs: Reviewed and stable  Last Vitals:  Vitals Value Taken Time  BP 183/70 03/18/23 1645  Temp    Pulse 86 03/18/23 1648  Resp 20 03/18/23 1648  SpO2 100 % 03/18/23 1648  Vitals shown include unvalidated device data.  Last Pain:  Vitals:   03/18/23 1312  TempSrc:   PainSc: 4          Complications: No notable events documented.

## 2023-03-18 NOTE — Plan of Care (Signed)
Patient alert/oriented X4 on admission. Patient presents with R AKA on negative pressure wound therapy cont. Set at -125. No drainage noted from wound vacuum. VSS upon admission. Will continue to monitor. Patient placed on purewick and turned Q2 hours.   Problem: Education: Goal: Knowledge of the prescribed therapeutic regimen will improve Outcome: Progressing   Problem: Education: Goal: Ability to verbalize activity precautions or restrictions will improve Outcome: Progressing   Problem: Education: Goal: Understanding of discharge needs will improve Outcome: Progressing   Problem: Activity: Goal: Ability to perform//tolerate increased activity and mobilize with assistive devices will improve Outcome: Progressing   Problem: Clinical Measurements: Goal: Postoperative complications will be avoided or minimized Outcome: Progressing   Problem: Self-Care: Goal: Ability to meet self-care needs will improve Outcome: Progressing   Problem: Clinical Measurements: Goal: Postoperative complications will be avoided or minimized Outcome: Progressing   Problem: Self-Care: Goal: Ability to meet self-care needs will improve Outcome: Progressing   Problem: Self-Concept: Goal: Ability to maintain and perform role responsibilities to the fullest extent possible will improve Outcome: Progressing   Problem: Pain Management: Goal: Pain level will decrease with appropriate interventions Outcome: Progressing   Problem: Skin Integrity: Goal: Demonstration of wound healing without infection will improve Outcome: Progressing   Problem: Education: Goal: Knowledge of General Education information will improve Description: Including pain rating scale, medication(s)/side effects and non-pharmacologic comfort measures Outcome: Progressing   Problem: Health Behavior/Discharge Planning: Goal: Ability to manage health-related needs will improve Outcome: Progressing   Problem: Clinical  Measurements: Goal: Ability to maintain clinical measurements within normal limits will improve Outcome: Progressing   Problem: Clinical Measurements: Goal: Will remain free from infection Outcome: Progressing   Problem: Clinical Measurements: Goal: Diagnostic test results will improve Outcome: Progressing   Problem: Clinical Measurements: Goal: Respiratory complications will improve Outcome: Progressing   Problem: Clinical Measurements: Goal: Cardiovascular complication will be avoided Outcome: Progressing   Problem: Activity: Goal: Risk for activity intolerance will decrease Outcome: Progressing   Problem: Nutrition: Goal: Adequate nutrition will be maintained Outcome: Progressing   Problem: Coping: Goal: Level of anxiety will decrease Outcome: Progressing   Problem: Elimination: Goal: Will not experience complications related to bowel motility Outcome: Progressing   Problem: Elimination: Goal: Will not experience complications related to urinary retention Outcome: Progressing   Problem: Pain Managment: Goal: General experience of comfort will improve Outcome: Progressing   Problem: Safety: Goal: Ability to remain free from injury will improve Outcome: Progressing   Problem: Skin Integrity: Goal: Risk for impaired skin integrity will decrease Outcome: Progressing

## 2023-03-18 NOTE — Anesthesia Procedure Notes (Signed)
Procedure Name: LMA Insertion Date/Time: 03/18/2023 3:42 PM  Performed by: Audie Pinto, CRNAPre-anesthesia Checklist: Patient identified, Emergency Drugs available, Suction available and Patient being monitored Patient Re-evaluated:Patient Re-evaluated prior to induction Oxygen Delivery Method: Circle system utilized Preoxygenation: Pre-oxygenation with 100% oxygen Induction Type: IV induction LMA: LMA inserted LMA Size: 4.0 Placement Confirmation: positive ETCO2 Dental Injury: Teeth and Oropharynx as per pre-operative assessment

## 2023-03-19 LAB — CBC
HCT: 28.9 % — ABNORMAL LOW (ref 36.0–46.0)
Hemoglobin: 9.2 g/dL — ABNORMAL LOW (ref 12.0–15.0)
MCH: 28.2 pg (ref 26.0–34.0)
MCHC: 31.8 g/dL (ref 30.0–36.0)
MCV: 88.7 fL (ref 80.0–100.0)
Platelets: 422 10*3/uL — ABNORMAL HIGH (ref 150–400)
RBC: 3.26 MIL/uL — ABNORMAL LOW (ref 3.87–5.11)
RDW: 15.7 % — ABNORMAL HIGH (ref 11.5–15.5)
WBC: 9.7 10*3/uL (ref 4.0–10.5)
nRBC: 0 % (ref 0.0–0.2)

## 2023-03-19 LAB — BASIC METABOLIC PANEL
Anion gap: 9 (ref 5–15)
BUN: 26 mg/dL — ABNORMAL HIGH (ref 8–23)
CO2: 24 mmol/L (ref 22–32)
Calcium: 7.7 mg/dL — ABNORMAL LOW (ref 8.9–10.3)
Chloride: 106 mmol/L (ref 98–111)
Creatinine, Ser: 1.19 mg/dL — ABNORMAL HIGH (ref 0.44–1.00)
GFR, Estimated: 44 mL/min — ABNORMAL LOW (ref >60)
Glucose, Bld: 102 mg/dL — ABNORMAL HIGH (ref 70–99)
Potassium: 4.3 mmol/L (ref 3.5–5.1)
Sodium: 139 mmol/L (ref 135–145)

## 2023-03-19 NOTE — Plan of Care (Signed)
Patient alert/oriented X4. Patient was able to dangle off the side of the bed this morning. Patient turned Q2 hours and given morphine and zofran as needed for pain and nausea. Patient tolerating cont. IV fluids. Patient VSS, will continue to monitor.   Problem: Education: Goal: Knowledge of the prescribed therapeutic regimen will improve Outcome: Progressing   Problem: Education: Goal: Ability to verbalize activity precautions or restrictions will improve Outcome: Progressing   Problem: Education: Goal: Understanding of discharge needs will improve Outcome: Progressing   Problem: Activity: Goal: Ability to perform//tolerate increased activity and mobilize with assistive devices will improve Outcome: Progressing   Problem: Clinical Measurements: Goal: Postoperative complications will be avoided or minimized Outcome: Progressing   Problem: Self-Care: Goal: Ability to meet self-care needs will improve Outcome: Progressing   Problem: Self-Concept: Goal: Ability to maintain and perform role responsibilities to the fullest extent possible will improve Outcome: Progressing   Problem: Pain Management: Goal: Pain level will decrease with appropriate interventions Outcome: Progressing   Problem: Skin Integrity: Goal: Demonstration of wound healing without infection will improve Outcome: Progressing   Problem: Education: Goal: Knowledge of General Education information will improve Description: Including pain rating scale, medication(s)/side effects and non-pharmacologic comfort measures Outcome: Progressing   Problem: Health Behavior/Discharge Planning: Goal: Ability to manage health-related needs will improve Outcome: Progressing   Problem: Clinical Measurements: Goal: Ability to maintain clinical measurements within normal limits will improve Outcome: Progressing   Problem: Clinical Measurements: Goal: Will remain free from infection Outcome: Progressing   Problem:  Clinical Measurements: Goal: Diagnostic test results will improve Outcome: Progressing   Problem: Clinical Measurements: Goal: Respiratory complications will improve Outcome: Progressing   Problem: Clinical Measurements: Goal: Cardiovascular complication will be avoided Outcome: Progressing   Problem: Activity: Goal: Risk for activity intolerance will decrease Outcome: Progressing   Problem: Nutrition: Goal: Adequate nutrition will be maintained Outcome: Progressing   Problem: Coping: Goal: Level of anxiety will decrease Outcome: Progressing   Problem: Elimination: Goal: Will not experience complications related to bowel motility Outcome: Progressing   Problem: Elimination: Goal: Will not experience complications related to urinary retention Outcome: Progressing   Problem: Pain Managment: Goal: General experience of comfort will improve Outcome: Progressing   Problem: Safety: Goal: Ability to remain free from injury will improve Outcome: Progressing   Problem: Skin Integrity: Goal: Risk for impaired skin integrity will decrease Outcome: Progressing

## 2023-03-19 NOTE — Evaluation (Signed)
Physical Therapy Evaluation Patient Details Name: [name] MRN: [id number] DOB: Apr 28, 1933 Today's Date: 03/19/2023  History of Present Illness  The pt is an 89 yo female presenting 6/21 with gangrenous ulcer of R foot and is now s/p R AKA. PMH includes: anxiety, arthritis, CAD, depression, HLD, aortic stenosis s/p TAVR 2023, bilateral TKA, L THA.   Clinical Impression  Pt in bed upon arrival of PT, agreeable to evaluation at this time. Prior to admission the pt was independent with lateral scoot transfers to and from [facility], living alone and independent at [facility] level with most ADLs and IADLs. The The pt now presents with limitations in functional mobility, strength, ROM, balance, and activity tolerance due to above dx, and will continue to benefit from skilled PT to address these deficits. The pt required maxA for all bed mobility and transition to sitting EOB where she required minA to maintain sitting balance as well as UE support on rail. The pt was unable to complete any passive hip extension or abduction of RLE, limited by pain at this time. Will benefit from return to rehab setting for continued strengthening and ROM exercises.       Recommendations for follow up therapy are one component of a multi-disciplinary discharge planning process, led by the attending physician.  Recommendations may be updated based on patient status, additional functional criteria and insurance authorization.  Follow Up Recommendations Can patient physically be transported by private vehicle: No     Assistance Recommended at Discharge Frequent or constant Supervision/Assistance  Patient can return home with the following  Two people to help with walking and/or transfers;A lot of help with bathing/dressing/bathroom;Assistance with cooking/housework;Direct supervision/assist for medications management;Direct supervision/assist for financial management;Assist for transportation;Help with stairs or ramp for  entrance    Equipment Recommendations None recommended by PT  Recommendations for Other Services       Functional Status Assessment Patient has had a recent decline in their functional status and demonstrates the ability to make significant improvements in function in a reasonable and predictable amount of time.     Precautions / Restrictions Precautions Precautions: Fall Precaution Comments: wound vac to RLE Restrictions Weight Bearing Restrictions: Yes RLE Weight Bearing: Non weight bearing      Mobility  Bed Mobility Overal bed mobility: Needs Assistance Bed Mobility: Rolling, Supine to Sit, Sit to Supine Rolling: Max assist   Supine to sit: Max assist, HOB elevated Sit to supine: Max assist, HOB elevated   General bed mobility comments: maxA with chuck pad to initiate rolling then pt attempting to assist wtih UE on bed rail. maxA to manage movement of either LE and to elevate trunk from elevated HOB. dependent on assist for scooting    Transfers                   General transfer comment: pt unable to tolerate sitting at this time       Balance Overall balance assessment: Needs assistance Sitting-balance support: Bilateral upper extremity supported, Feet supported Sitting balance-Leahy Scale: Poor Sitting balance - Comments: posterior lean and unable to adjust sitting. poor balance without weight of RLE Postural control: Posterior lean, Left lateral lean                                   Pertinent Vitals/Pain Pain Assessment Pain Assessment: Faces Faces Pain Scale: Hurts little more Pain Location: Rt leg Pain Descriptors /  Indicators: Aching, Discomfort, Guarding, Grimacing Pain Intervention(s): Monitored during session, Limited activity within patient's tolerance, Repositioned, RN gave pain meds during session    Home Living Family/patient expects to be discharged to:: Private residence Living Arrangements: Alone Available Help at  Discharge: Family;Available PRN/intermittently Type of Home: House Home Access: Ramped entrance       Home Layout: One level Home Equipment: Agricultural consultant (2 wheels);Rollator (4 wheels);Standard Walker;Wheelchair - manual;Grab bars - toilet Additional Comments: Waste management picks up her trash for her, One daughter nearby, limited help. Uses a transportation service for MD appointments.    Prior Function Prior Level of Function : Needs assist             Mobility Comments: uses WC, transfers with lateral scoot ADLs Comments: bird baths for 12 years, has been in [facility] for 12 years, reports people bring food, reports independence with cooking and preparing food, gets dressed independently. most difficulty with commode     Hand Dominance   Dominant Hand: Right    Extremity/Trunk Assessment   Upper Extremity Assessment Upper Extremity Assessment: Generalized weakness (limited shoulder flexion to ~90 deg due to arthritis bilaterally)    Lower Extremity Assessment Lower Extremity Assessment: RLE deficits/detail;LLE deficits/detail RLE Deficits / Details: hip flexion contracture, unable to extend actively or tolerate passive attempts due to pain. reports sensation in thigh is equal to LLE. RLE: Unable to fully assess due to pain RLE Sensation: WNL LLE Deficits / Details: maintains slight knee bend in supein which pt reports is new but is documented from last admission a few weeks ago. able to activate muscles to move at knee, ankle, and hip but unable to use functionally to assist with movement. pt reports pinky and 4th toe infected LLE Sensation: WNL LLE Coordination: WNL    Cervical / Trunk Assessment Cervical / Trunk Assessment: Kyphotic  Communication   Communication: No difficulties  Cognition Arousal/Alertness: Awake/alert Behavior During Therapy: WFL for tasks assessed/performed Overall Cognitive Status: No family/caregiver present to determine baseline cognitive  functioning                                 General Comments: suspect pt is at baseline; has impaired memory, poor safety awareness, decreased initiation with all tasks        General Comments General comments (skin integrity, edema, etc.): VSS on RA, pt soiled of urine upon arrival`    Exercises Amputee Exercises Quad Sets: Left, 5 reps Hip Extension: PROM, Right, Sidelying (pt unable to tolerate, attempted x4 in sidelying without noticeable change in position) Hip Flexion/Marching: AROM, Both, 5 reps   Assessment/Plan    PT Assessment Patient needs continued PT services  PT Problem List Decreased balance;Decreased mobility;Decreased strength;Decreased knowledge of use of DME;Decreased safety awareness;Decreased activity tolerance       PT Treatment Interventions DME instruction;Functional mobility training;Balance training;Patient/family education;Therapeutic activities;Therapeutic exercise;Wheelchair mobility training    PT Goals (Current goals can be found in the Care Plan section)  Acute Rehab PT Goals Patient Stated Goal: to heal wounds and return home to her dogs PT Goal Formulation: With patient Time For Goal Achievement: 04/02/23 Potential to Achieve Goals: Fair    Frequency Min 3X/week        AM-PAC PT "6 Clicks" Mobility  Outcome Measure Help needed turning from your back to your side while in a flat bed without using bedrails?: A Lot Help needed moving from lying on your  back to sitting on the side of a flat bed without using bedrails?: Total Help needed moving to and from a bed to a chair (including a wheelchair)?: Total Help needed standing up from a chair using your arms (e.g., wheelchair or bedside chair)?: Total Help needed to walk in hospital room?: Total Help needed climbing 3-5 steps with a railing? : Total 6 Click Score: 7    End of Session   Activity Tolerance: Patient limited by pain Patient left: in bed;with call bell/phone  within reach;with bed alarm set;with nursing/sitter in room Nurse Communication: Mobility status PT Visit Diagnosis: Muscle weakness (generalized) (M62.81);Other abnormalities of gait and mobility (R26.89)    Time: 1610-9604 PT Time Calculation (min) (ACUTE ONLY): 43 min   Charges:   PT Evaluation $PT Eval Low Complexity: 1 Low PT Treatments $Therapeutic Exercise: 8-22 mins $Therapeutic Activity: 8-22 mins        Vickki Muff, PT, DPT   Acute Rehabilitation Department Office [phone] Secure Chat Communication Preferred  Ronnie Derby 03/19/2023, 10:46 AM

## 2023-03-19 NOTE — Progress Notes (Signed)
Patient ID: [name], female   DOB: Mar 31, 1933, 89 y.o.   MRN: [id number] Patient is postoperative day 1 right above-the-knee amputation.  She is comfortable sitting at bedside.  Plan for return to skilled nursing.  Anticipate discharge Monday.

## 2023-03-19 NOTE — Plan of Care (Signed)

## 2023-03-20 ENCOUNTER — Encounter (HOSPITAL_COMMUNITY): Payer: Self-pay | Admitting: Orthopedic Surgery

## 2023-03-20 LAB — BASIC METABOLIC PANEL
Anion gap: 7 (ref 5–15)
BUN: 31 mg/dL — ABNORMAL HIGH (ref 8–23)
CO2: 25 mmol/L (ref 22–32)
Calcium: 7.8 mg/dL — ABNORMAL LOW (ref 8.9–10.3)
Chloride: 103 mmol/L (ref 98–111)
Creatinine, Ser: 1.04 mg/dL — ABNORMAL HIGH (ref 0.44–1.00)
GFR, Estimated: 52 mL/min — ABNORMAL LOW (ref >60)
Glucose, Bld: 79 mg/dL (ref 70–99)
Potassium: 3.7 mmol/L (ref 3.5–5.1)
Sodium: 135 mmol/L (ref 135–145)

## 2023-03-20 LAB — CBC
HCT: 26.8 % — ABNORMAL LOW (ref 36.0–46.0)
Hemoglobin: 8.7 g/dL — ABNORMAL LOW (ref 12.0–15.0)
MCH: 28.5 pg (ref 26.0–34.0)
MCHC: 32.5 g/dL (ref 30.0–36.0)
MCV: 87.9 fL (ref 80.0–100.0)
Platelets: 357 10*3/uL (ref 150–400)
RBC: 3.05 MIL/uL — ABNORMAL LOW (ref 3.87–5.11)
RDW: 15.7 % — ABNORMAL HIGH (ref 11.5–15.5)
WBC: 7.8 10*3/uL (ref 4.0–10.5)
nRBC: 0 % (ref 0.0–0.2)

## 2023-03-20 NOTE — TOC Progression Note (Signed)
Transition of Care [facility]) - Progression Note    Patient Details  Name: [name] MRN: [id number] Date of Birth: 1933/04/07  Transition of Care [facility]) CM/SW Contact  Leander Rams, LCSW Phone Number: 03/20/2023, 10:53 AM  Clinical Narrative:       Expected Discharge Plan: Skilled Nursing Facility Barriers to Discharge: Continued Medical Work up  Expected Discharge Plan and Services  CSW met with pt at bedside to discuss STR. Pt expressed that she has previously gone to rehab about 12 years ago and understands the need for it now. Pt is agreeable to dc to STR to gain her strength back. Pt does not have a preference.   CSW will complete fl2 and fax out. TOC will continue to follow.   Post Acute Care Choice: Skilled Nursing Facility Living arrangements for the past 2 months: Single Family Home                                       Social Determinants of Health (SDOH) Interventions SDOH Screenings   Food Insecurity: No Food Insecurity (03/02/2023)  Housing: Patient Declined (03/02/2023)  Transportation Needs: No Transportation Needs (03/02/2023)  Utilities: Not At Risk (03/02/2023)  Financial Resource Strain: Low Risk  (01/28/2023)  Tobacco Use: Medium Risk (03/20/2023)    Readmission Risk Interventions    05/26/2022   11:22 AM  Readmission Risk Prevention Plan  Post Dischage Appt Complete  Medication Screening Complete  Transportation Screening Complete  Oletta Lamas, MSW, LCSWA, LCASA Transitions of Care  Clinical Social Worker I

## 2023-03-20 NOTE — Evaluation (Addendum)
Occupational Therapy Evaluation Patient Details Name: [name] MRN: [id number] DOB: [birth year] Today's Date: 03/20/2023   History of Present Illness The pt is an 89 yo female presenting 6/21 with gangrenous ulcer of R foot and is now s/p R AKA. PMH includes: anxiety, arthritis, CAD, depression, HLD, aortic stenosis s/p TAVR 2023, bilateral TKA, L THA.   Clinical Impression   PTA, pt unable to report whether she was at rehab or home, was able to scoot transfer mod I and had assist with IADL. Upon eval, pt with decreased balance, strength, problem solving, safety, and awareness. Pt additionally highly internally distractible. Pt coming to EOB with max A. Able to static sit min A progressing to min guard A. Attempting lateral scoot along EOB, but ultimately total A. Will follow acutely to optimize participation in ADL. Patient will benefit from continued inpatient follow up therapy, <3 hours/day      Recommendations for follow up therapy are one component of a multi-disciplinary discharge planning process, led by the attending physician.  Recommendations may be updated based on patient status, additional functional criteria and insurance authorization.   Assistance Recommended at Discharge Intermittent Supervision/Assistance  Patient can return home with the following Two people to help with walking and/or transfers;A lot of help with bathing/dressing/bathroom    Functional Status Assessment  Patient has had a recent decline in their functional status and demonstrates the ability to make significant improvements in function in a reasonable and predictable amount of time.  Equipment Recommendations  None recommended by OT    Recommendations for Other Services       Precautions / Restrictions Precautions Precautions: Fall Precaution Comments: wound vac to RLE Restrictions Weight Bearing Restrictions: Yes RLE Weight Bearing: Non weight bearing      Mobility Bed  Mobility Overal bed mobility: Needs Assistance Bed Mobility: Rolling, Supine to Sit, Sit to Supine Rolling: Max assist   Supine to sit: Max assist, HOB elevated Sit to supine: Max assist, HOB elevated   General bed mobility comments: maxA with chuck pad to initiate rolling then pt attempting to assist wtih UE on bed rail. maxA to manage movement of either LE and to elevate trunk from elevated HOB. dependent on assist for scooting    Transfers                   General transfer comment: total A for scoot very short distance. Pt seeming to have diffiuclty processing/sequencing scoot      Balance Overall balance assessment: Needs assistance Sitting-balance support: Bilateral upper extremity supported, Feet supported Sitting balance-Leahy Scale: Fair                                     ADL either performed or assessed with clinical judgement   ADL Overall ADL's : Needs assistance/impaired Eating/Feeding: Modified independent;Bed level Eating/Feeding Details (indicate cue type and reason): needs positioning of tray table ergonomically due to shoulder ROM difficulties Grooming: Set up;Sitting   Upper Body Bathing: Minimal assistance;Sitting   Lower Body Bathing: Total assistance   Upper Body Dressing : Minimal assistance;Sitting   Lower Body Dressing: Total assistance   Toilet Transfer: Total assistance (lateral scoot)                   Vision Baseline Vision/History: 1 Wears glasses Ability to See in Adequate Light: 0 Adequate Patient Visual Report: No change from baseline Vision Assessment?:  No apparent visual deficits     Perception     Praxis      Pertinent Vitals/Pain Pain Assessment Pain Assessment: Faces Faces Pain Scale: Hurts even more Pain Location: Rt leg Pain Descriptors / Indicators: Aching, Discomfort, Guarding, Grimacing Pain Intervention(s): Limited activity within patient's tolerance, Monitored during session      Hand Dominance Right   Extremity/Trunk Assessment Upper Extremity Assessment Upper Extremity Assessment: Generalized weakness;RUE deficits/detail;LUE deficits/detail RUE Deficits / Details: audible crepitus in shoulder and limited ROM using hand to help scoot but too weak to make transition to chair LUE Deficits / Details: reports L arm is weaker, noted arthritic changes throughout hand and wrist   Lower Extremity Assessment Lower Extremity Assessment: Defer to PT evaluation   Cervical / Trunk Assessment Cervical / Trunk Assessment: Kyphotic   Communication Communication Communication: No difficulties   Cognition Arousal/Alertness: Awake/alert Behavior During Therapy: WFL for tasks assessed/performed Overall Cognitive Status: No family/caregiver present to determine baseline cognitive functioning                                 General Comments: suspect pt is at baseline; has impaired memory, poor safety awareness, decreased initiation with all tasks     General Comments  VSS on RA    Exercises Exercises: Other exercises Other Exercises Other Exercises: educating regarding desensitization for residual limb   Shoulder Instructions      Home Living Family/patient expects to be discharged to:: Private residence Living Arrangements: Alone Available Help at Discharge: Family;Available PRN/intermittently Type of Home: House Home Access: Ramped entrance     Home Layout: One level     Bathroom Shower/Tub: Tub/shower unit;Sponge bathes at baseline   [employer]: Standard     Home Equipment: Agricultural consultant (2 wheels);Rollator (4 wheels);Standard Walker;Wheelchair - manual;Grab bars - toilet   Additional Comments: Waste management picks up her trash for her, One daughter nearby, limited help. Uses a transportation service for MD appointments.      Prior Functioning/Environment Prior Level of Function : Needs assist             Mobility  Comments: uses WC, transfers with lateral scoot ADLs Comments: bird baths for 12 years, has been in [facility] for 12 years, reports people bring food, reports independence with cooking and preparing food, gets dressed independently. most difficulty with commode        OT Problem List: Decreased strength;Impaired balance (sitting and/or standing);Decreased activity tolerance;Decreased range of motion;Decreased cognition;Decreased safety awareness;Decreased knowledge of use of DME or AE;Impaired UE functional use      OT Treatment/Interventions: Self-care/ADL training;Therapeutic exercise;DME and/or AE instruction;Balance training;Patient/family education;Therapeutic activities    OT Goals(Current goals can be found in the care plan section) Acute Rehab OT Goals Patient Stated Goal: transfer OT Goal Formulation: With patient Time For Goal Achievement: 04/03/23 Potential to Achieve Goals: Good ADL Goals Pt Will Perform Grooming: with modified independence;sitting Pt Will Perform Lower Body Dressing: with mod assist;sitting/lateral leans Pt Will Transfer to Toilet: with mod assist;with transfer board Pt/caregiver will Perform Home Exercise Program: Both right and left upper extremity;With written HEP provided;Increased strength  OT Frequency: Min 2X/week    Co-evaluation              AM-PAC OT "6 Clicks" Daily Activity     Outcome Measure Help from another person eating meals?: None Help from another person taking care of personal grooming?: A Little Help  from another person toileting, which includes using toliet, bedpan, or urinal?: Total Help from another person bathing (including washing, rinsing, drying)?: A Lot Help from another person to put on and taking off regular upper body clothing?: A Little Help from another person to put on and taking off regular lower body clothing?: Total 6 Click Score: 14   End of Session Nurse Communication: Mobility status  Activity Tolerance:  Patient tolerated treatment well Patient left: in bed;with call bell/phone within reach;with bed alarm set  OT Visit Diagnosis: Unsteadiness on feet (R26.81);Muscle weakness (generalized) (M62.81);Other symptoms and signs involving cognitive function                Time: 9562-1308 OT Time Calculation (min): 32 min Charges:  OT General Charges $OT Visit: 1 Visit OT Evaluation $OT Eval Moderate Complexity: 1 Mod OT Treatments $Self Care/Home Management : 8-22 mins  Tyler Deis, OTR/L [facility] Acute Rehabilitation Office: [phone]   Myrla Halsted 03/20/2023, 11:59 AM

## 2023-03-20 NOTE — NC FL2 (Signed)
[place] MEDICAID FL2 LEVEL OF CARE FORM     IDENTIFICATION  Patient Name: [name] Birthdate: 09-25-1933 Sex: female Admission Date (Current Location): 03/18/2023  [facility] and [place] Number:  Producer, television/film/video and Address:  [facility] [place], [address] [place], [place] [address]      Provider Number: [id number]  Attending Physician Name and Address:  [name], MD  Relative Name and Phone Number:       Current Level of Care: Hospital Recommended Level of Care: Skilled Nursing Facility Prior Approval Number:    Date Approved/Denied:   PASRR Number: [id number]  Discharge Plan: SNF    Current Diagnoses: Patient Active Problem List   Diagnosis Date Noted   Gangrene of right foot (HCC) 03/18/2023   S/P AKA (above knee amputation) unilateral, right (HCC) 03/18/2023   PVD (peripheral vascular disease) (HCC) 03/02/2023   Osteomyelitis of toe of right foot (HCC) 03/01/2023   Dehydration 03/01/2023   Acute prerenal azotemia 03/01/2023   History of COPD 03/01/2023   Urinary incontinence 03/01/2023   Anemia of chronic disease 03/01/2023   Cellulitis 01/06/2023   Pressure ulcer of sacral region, stage 2 (HCC) 01/06/2023   Intertriginous dermatitis associated with moisture 01/06/2023   S/P TAVR (transcatheter aortic valve replacement) 05/25/2022   Nausea and vomiting 05/18/2022   Severe aortic stenosis    Depression 02/12/2017   Anxiety 02/12/2017   Hypokalemia 02/12/2017   Biliary colic    Acquired hypothyroidism    Hyperlipidemia    KNEE, ARTHRITIS, DEGEN./OSTEO 05/14/2010    Orientation RESPIRATION BLADDER Height & Weight     Self, Time, Situation, Place  Normal Incontinent, External catheter Weight: 181 lb (82.1 kg) Height:  5\' 9"  (175.3 cm)  BEHAVIORAL SYMPTOMS/MOOD NEUROLOGICAL BOWEL NUTRITION STATUS      Incontinent Diet (See dc summary)  AMBULATORY STATUS COMMUNICATION OF NEEDS Skin   Extensive Assist Verbally  Normal                       Personal Care Assistance Level of Assistance  Bathing, Feeding, Dressing Bathing Assistance: Maximum assistance Feeding assistance: Independent Dressing Assistance: Maximum assistance     Functional Limitations Info  Sight, Hearing, Speech Sight Info: Impaired Hearing Info: Adequate Speech Info: Adequate    SPECIAL CARE FACTORS FREQUENCY        PT Frequency: 5xweek OT Frequency: 5xweek            Contractures Contractures Info: Not present    Additional Factors Info  Code Status Code Status Info: Full             Current Medications (03/20/2023):  This is the current hospital active medication list Current Facility-Administered Medications  Medication Dose Route Frequency Provider Last Rate Last Admin   0.9 %  sodium chloride infusion   Intravenous Continuous [name], MD 75 mL/hr at 03/19/23 0849 New Bag at 03/19/23 0849   acetaminophen (TYLENOL) tablet 325-650 mg  325-650 mg Oral Q6H PRN [name], MD       alum & mag hydroxide-simeth (MAALOX/MYLANTA) 200-200-20 MG/5ML suspension 15-30 mL  15-30 mL Oral Q2H PRN [name], MD       ascorbic acid (VITAMIN C) tablet 1,000 mg  1,000 mg Oral Daily [name], MD   1,000 mg at 03/20/23 1013   bisacodyl (DULCOLAX) EC tablet 5 mg  5 mg Oral Daily PRN [name], MD  docusate sodium (COLACE) capsule 100 mg  100 mg Oral Daily [name], MD   100 mg at 03/20/23 1014   guaiFENesin-dextromethorphan (ROBITUSSIN DM) 100-10 MG/5ML syrup 15 mL  15 mL Oral Q4H PRN [name], MD       hydrALAZINE (APRESOLINE) injection 5 mg  5 mg Intravenous Q20 Min PRN [name], MD       HYDROcodone-acetaminophen (NORCO) 7.5-325 MG per tablet 1-2 tablet  1-2 tablet Oral Q4H PRN [name], MD   2 tablet at 03/20/23 0535   HYDROcodone-acetaminophen (NORCO/VICODIN) 5-325 MG per tablet 1-2 tablet  1-2 tablet Oral Q4H PRN [name], MD   2 tablet at 03/19/23 2158    labetalol (NORMODYNE) injection 10 mg  10 mg Intravenous Q10 min PRN [name], MD       levothyroxine (SYNTHROID) tablet 150 mcg  150 mcg Oral QAC breakfast [name], MD   150 mcg at 03/20/23 0535   magnesium citrate solution 1 Bottle  1 Bottle Oral Once PRN [name], MD       magnesium sulfate IVPB 2 g 50 mL  2 g Intravenous Daily PRN [name], MD       metoprolol tartrate (LOPRESSOR) injection 2-5 mg  2-5 mg Intravenous Q2H PRN [name], MD       morphine (PF) 2 MG/ML injection 0.5-1 mg  0.5-1 mg Intravenous Q2H PRN [name], MD   1 mg at 03/19/23 1707   nutrition supplement (JUVEN) (JUVEN) powder packet 1 packet  1 packet Oral BID BM [name], MD   1 packet at 03/20/23 1013   ondansetron (ZOFRAN) injection 4 mg  4 mg Intravenous Q6H PRN [name], MD   4 mg at 03/19/23 1714   oxybutynin (DITROPAN-XL) 24 hr tablet 15 mg  15 mg Oral Daily [name], MD   15 mg at 03/20/23 1014   pantoprazole (PROTONIX) EC tablet 40 mg  40 mg Oral Daily [name], MD   40 mg at 03/20/23 1013   PARoxetine (PAXIL) tablet 20 mg  20 mg Oral Daily [name], MD   20 mg at 03/20/23 1015   phenol (CHLORASEPTIC) mouth spray 1 spray  1 spray Mouth/Throat PRN [name], MD       polyethylene glycol (MIRALAX / GLYCOLAX) packet 17 g  17 g Oral Daily PRN [name], MD       potassium chloride SA (KLOR-CON M) CR tablet 20-40 mEq  20-40 mEq Oral Daily PRN [name], MD       zinc sulfate capsule 220 mg  220 mg Oral Daily [name], MD   220 mg at 03/20/23 1014     Discharge Medications: Please see discharge summary for a list of discharge medications.  Relevant Imaging Results:  Relevant Lab Results:   Additional Information SSN: [id number]  Oletta Lamas, MSW, [facility] Transitions of Care  Clinical Social Worker I

## 2023-03-20 NOTE — Progress Notes (Signed)
  Subjective: Patient stable.  Pain controlled.  Wound VAC is functional on right AKA   Objective: Vital signs in last 24 hours: Temp:  [97.9 F (36.6 C)-99.6 F (37.6 C)] 98.6 F (37 C) (06/23 0758) Pulse Rate:  [60-82] 60 (06/23 0758) Resp:  [17-18] 17 (06/23 0758) BP: (107-152)/(38-70) 131/38 (06/23 0758) SpO2:  [95 %-97 %] 96 % (06/23 0758)  Intake/Output from previous day: 06/22 0701 - 06/23 0700 In: 240 [P.O.:240] Out: -  Intake/Output this shift: No intake/output data recorded.  Exam:  No cellulitis present Compartment soft  Labs: Recent Labs    03/19/23 0615 03/20/23 0533  HGB 9.2* 8.7*   Recent Labs    03/19/23 0615 03/20/23 0533  WBC 9.7 7.8  RBC 3.26* 3.05*  HCT 28.9* 26.8*  PLT 422* 357   Recent Labs    03/19/23 0615 03/20/23 0533  NA 139 135  K 4.3 3.7  CL 106 103  CO2 24 25  BUN 26* 31*  CREATININE 1.19* 1.04*  GLUCOSE 102* 79  CALCIUM 7.7* 7.8*   No results for input(s): "LABPT", "INR" in the last 72 hours.  Assessment/Plan: Plan at this time is discharge sometime next week.  Currently medically stable and managing the situation reasonly well.   [name] [name] 03/20/2023, 8:05 AM

## 2023-03-21 DIAGNOSIS — R2689 Other abnormalities of gait and mobility: Secondary | ICD-10-CM | POA: Diagnosis not present

## 2023-03-21 DIAGNOSIS — E038 Other specified hypothyroidism: Secondary | ICD-10-CM | POA: Diagnosis not present

## 2023-03-21 DIAGNOSIS — J449 Chronic obstructive pulmonary disease, unspecified: Secondary | ICD-10-CM | POA: Diagnosis not present

## 2023-03-21 DIAGNOSIS — Z89611 Acquired absence of right leg above knee: Secondary | ICD-10-CM | POA: Diagnosis not present

## 2023-03-21 DIAGNOSIS — N178 Other acute kidney failure: Secondary | ICD-10-CM | POA: Diagnosis not present

## 2023-03-21 DIAGNOSIS — Z4781 Encounter for orthopedic aftercare following surgical amputation: Secondary | ICD-10-CM | POA: Diagnosis not present

## 2023-03-21 DIAGNOSIS — Z743 Need for continuous supervision: Secondary | ICD-10-CM | POA: Diagnosis not present

## 2023-03-21 DIAGNOSIS — I739 Peripheral vascular disease, unspecified: Secondary | ICD-10-CM | POA: Diagnosis not present

## 2023-03-21 DIAGNOSIS — Z7401 Bed confinement status: Secondary | ICD-10-CM | POA: Diagnosis not present

## 2023-03-21 DIAGNOSIS — M6281 Muscle weakness (generalized): Secondary | ICD-10-CM | POA: Diagnosis not present

## 2023-03-21 DIAGNOSIS — S88011A Complete traumatic amputation at knee level, right lower leg, initial encounter: Secondary | ICD-10-CM | POA: Diagnosis not present

## 2023-03-21 DIAGNOSIS — R531 Weakness: Secondary | ICD-10-CM | POA: Diagnosis not present

## 2023-03-21 MED ORDER — OXYCODONE HCL 5 MG PO TABS
5.0000 mg | ORAL_TABLET | Freq: Four times a day (QID) | ORAL | Status: DC | PRN
  Administered 2023-03-21 (×2): 5 mg via ORAL
  Filled 2023-03-21 (×2): qty 1

## 2023-03-21 NOTE — Care Management Important Message (Signed)
Important Message  Patient Details  Name: [name] MRN: [id number] Date of Birth: 01-Feb-1933   Medicare Important Message Given:  Yes     Dorena Bodo 03/21/2023, 2:27 PM

## 2023-03-21 NOTE — Consult Note (Signed)
   [facility] Inpatient Consult   03/21/2023  [name] November 22, 1932 [id number]  [facility] [[facility]]  Accountable Care Organization [ACO] Patient: [name] [Dual]  Primary Care Provider:  Assunta Found, MD with [facility]  *Less than 30 days readmission  Patient is currently active with [facility] [[facility]] Care Management for chronic disease management services.  Patient has been engaged on behalf of [facility] with a RN CC.  Our community based plan of care has focused on disease management and community resource support.  Patient was recently at [facility] for rehab and medical record notes for patient to return to a skilled nursing facility for ongoing rehab for post hospital transition needs.  Plan: To alert [facility] RN to follow up as patient was active with RN CC, if patient returns to a [facility] affiliated facility.  Of note, [facility] Care Management services does not replace or interfere with any services that are needed or arranged by inpatient [facility] care management team.   For additional questions or referrals please contact:  Charlesetta Shanks, RN BSN CCM [facility]  [phone] business mobile phone Toll free office [phone]  *Concierge Line  [phone] Fax number: [phone] [email or web address]

## 2023-03-21 NOTE — Progress Notes (Signed)
Physical Therapy Treatment Patient Details Name: [name] MRN: [id number] DOB: Feb 20, 1933 Today's Date: 03/21/2023   History of Present Illness The pt is an 89 yo female presenting 6/21 with gangrenous ulcer of R foot and is now s/p R AKA. PMH includes: anxiety, arthritis, CAD, depression, HLD, aortic stenosis s/p TAVR 2023, bilateral TKA, L THA.    PT Comments    Pt admitted with above diagnosis. Pt able to sit EOB x 10 min with min guard assist. Pt having a lot of phantom pain in right AKA however did tolerate sitting.  Needs mod to max assist currently for mobilizing.  Will follow acutely.  Pt currently with functional limitations due to the deficits listed below (see PT Problem List). Pt will benefit from acute skilled PT to increase their independence and safety with mobility to allow discharge.      Recommendations for follow up therapy are one component of a multi-disciplinary discharge planning process, led by the attending physician.  Recommendations may be updated based on patient status, additional functional criteria and insurance authorization.  Follow Up Recommendations  Can patient physically be transported by private vehicle: No    Assistance Recommended at Discharge Frequent or constant Supervision/Assistance  Patient can return home with the following Two people to help with walking and/or transfers;A lot of help with bathing/dressing/bathroom;Assistance with cooking/housework;Direct supervision/assist for medications management;Direct supervision/assist for financial management;Assist for transportation;Help with stairs or ramp for entrance   Equipment Recommendations  None recommended by PT    Recommendations for Other Services       Precautions / Restrictions Precautions Precautions: Fall Precaution Comments: wound vac to RLE Restrictions Weight Bearing Restrictions: Yes RLE Weight Bearing: Non weight bearing     Mobility  Bed Mobility Overal bed  mobility: Needs Assistance Bed Mobility: Rolling, Supine to Sit, Sit to Supine Rolling: Max assist   Supine to sit: Max assist, HOB elevated Sit to supine: Max assist, HOB elevated   General bed mobility comments: maxA with chuck pad to initiate rolling then pt attempting to assist wtih UE on bed rail. maxA to manage movement of either LE and mod assist to elevate trunk from elevated HOB. dependent on assist for scooting    Transfers                   General transfer comment: Pt declined OOB as she was having incr phantom pain today. Also notified nursing as her VAC had leak and nurse was going to check into the leak and wanted her in bed as well.    Ambulation/Gait                   Stairs             Wheelchair Mobility    Modified Rankin (Stroke Patients Only)       Balance Overall balance assessment: Needs assistance Sitting-balance support: Bilateral upper extremity supported, Feet supported, Single extremity supported, No upper extremity supported Sitting balance-Leahy Scale: Fair Sitting balance - Comments: Pt sat EOB x 10 min with min guard assist. Postural control: Left lateral lean                                  Cognition Arousal/Alertness: Awake/alert Behavior During Therapy: WFL for tasks assessed/performed Overall Cognitive Status: No family/caregiver present to determine baseline cognitive functioning  General Comments: suspect pt is at baseline; has impaired memory, poor safety awareness, decreased initiation with all tasks        Exercises Amputee Exercises Quad Sets: Left, 5 reps Hip Extension: PROM, Right, Sidelying (pt unable to tolerate, attempted x4 in sidelying without noticeable change in position) Hip Flexion/Marching: AROM, Both, 5 reps Other Exercises Other Exercises: educating regarding desensitization for residual limb Other Exercises: Tried to get pt to  incr knee extension left LE and stretch.  Also tried to incr hip extension right AKA    General Comments General comments (skin integrity, edema, etc.): VSS      Pertinent Vitals/Pain Pain Assessment Pain Assessment: Faces Faces Pain Scale: Hurts whole lot Breathing: normal Negative Vocalization: none Facial Expression: smiling or inexpressive Body Language: relaxed Consolability: no need to console PAINAD Score: 0 Pain Location: Rt leg Pain Descriptors / Indicators: Aching, Discomfort, Guarding, Grimacing Pain Intervention(s): Limited activity within patient's tolerance, Monitored during session, Repositioned, Premedicated before session    Home Living                          Prior Function            PT Goals (current goals can now be found in the care plan section) Acute Rehab PT Goals Patient Stated Goal: to heal wounds and return home to her dogs Progress towards PT goals: Progressing toward goals    Frequency    Min 3X/week      PT Plan Current plan remains appropriate    Co-evaluation              AM-PAC PT "6 Clicks" Mobility   Outcome Measure  Help needed turning from your back to your side while in a flat bed without using bedrails?: A Lot Help needed moving from lying on your back to sitting on the side of a flat bed without using bedrails?: A Lot Help needed moving to and from a bed to a chair (including a wheelchair)?: Total Help needed standing up from a chair using your arms (e.g., wheelchair or bedside chair)?: Total Help needed to walk in hospital room?: Total Help needed climbing 3-5 steps with a railing? : Total 6 Click Score: 8    End of Session   Activity Tolerance: Patient limited by pain;Patient limited by fatigue Patient left: in bed;with call bell/phone within reach;with bed alarm set;with nursing/sitter in room Nurse Communication: Mobility status;Need for lift equipment PT Visit Diagnosis: Muscle weakness  (generalized) (M62.81);Other abnormalities of gait and mobility (R26.89)     Time: 1205-1224 PT Time Calculation (min) (ACUTE ONLY): 19 min  Charges:  $Therapeutic Activity: 8-22 mins                     [facility] M,PT Acute Rehab Services [phone]    [name] 03/21/2023, 2:14 PM

## 2023-03-21 NOTE — Progress Notes (Signed)
Patient ID: [name], female   DOB: 06-29-1933, 89 y.o.   MRN: [id number] Patient is status post right above-the-knee amputation.  There is no drainage in the wound VAC canister.  There is not a good suction fit.  Orders written for the wound VAC dressing to be removed prior to discharge.  Orders placed for discharge back to skilled nursing facility today.

## 2023-03-21 NOTE — Progress Notes (Signed)
Report called to [facility] at [facility] [employer] [facility] and nursing center at [phone] .

## 2023-03-21 NOTE — Plan of Care (Signed)
Patient alert/oriented X4. Patient compliant with medication administration and given norco and morphine as needed for pain. Patient negative pressure wound vacuum removed and dry dressing applied to R knee stump. Report called to rehab facility, PIV removed prior to discharge and patient has personal belongings set up at bedside. AVS discharge instructions explained in detail to patient. VSS. Will continue to monitor.  Problem: Education: Goal: Knowledge of the prescribed therapeutic regimen will improve Outcome: Progressing   Problem: Education: Goal: Ability to verbalize activity precautions or restrictions will improve Outcome: Progressing   Problem: Education: Goal: Understanding of discharge needs will improve Outcome: Progressing   Problem: Activity: Goal: Ability to perform//tolerate increased activity and mobilize with assistive devices will improve Outcome: Progressing   Problem: Clinical Measurements: Goal: Postoperative complications will be avoided or minimized Outcome: Progressing   Problem: Self-Care: Goal: Ability to meet self-care needs will improve Outcome: Progressing   Problem: Self-Concept: Goal: Ability to maintain and perform role responsibilities to the fullest extent possible will improve Outcome: Progressing   Problem: Pain Management: Goal: Pain level will decrease with appropriate interventions Outcome: Progressing   Problem: Skin Integrity: Goal: Demonstration of wound healing without infection will improve Outcome: Progressing   Problem: Education: Goal: Knowledge of General Education information will improve Description: Including pain rating scale, medication(s)/side effects and non-pharmacologic comfort measures Outcome: Progressing   Problem: Health Behavior/Discharge Planning: Goal: Ability to manage health-related needs will improve Outcome: Progressing   Problem: Clinical Measurements: Goal: Ability to maintain clinical measurements  within normal limits will improve Outcome: Progressing   Problem: Clinical Measurements: Goal: Will remain free from infection Outcome: Progressing   Problem: Clinical Measurements: Goal: Diagnostic test results will improve Outcome: Progressing   Problem: Clinical Measurements: Goal: Respiratory complications will improve Outcome: Progressing   Problem: Clinical Measurements: Goal: Cardiovascular complication will be avoided Outcome: Progressing   Problem: Activity: Goal: Risk for activity intolerance will decrease Outcome: Progressing   Problem: Nutrition: Goal: Adequate nutrition will be maintained Outcome: Progressing   Problem: Coping: Goal: Level of anxiety will decrease Outcome: Progressing   Problem: Elimination: Goal: Will not experience complications related to bowel motility Outcome: Progressing   Problem: Elimination: Goal: Will not experience complications related to urinary retention Outcome: Progressing   Problem: Pain Managment: Goal: General experience of comfort will improve Outcome: Progressing   Problem: Safety: Goal: Ability to remain free from injury will improve Outcome: Progressing   Problem: Skin Integrity: Goal: Risk for impaired skin integrity will decrease Outcome: Progressing

## 2023-03-21 NOTE — Discharge Summary (Signed)
Discharge Diagnoses:  Principal Problem:   S/P AKA (above knee amputation) unilateral, right (HCC) Active Problems:   Gangrene of right foot (HCC)   Surgeries: Procedure(s): RIGHT ABOVE KNEE AMPUTATION APPLICATION OF WOUND VAC on 03/18/2023    Consultants:   Discharged Condition: Improved  Hospital Course: [name] is an 89 y.o. female who was admitted 03/18/2023 with a chief complaint of gangrene right foot, with a final diagnosis of Gangrene Right Foot.  Patient was brought to the operating room on 03/18/2023 and underwent Procedure(s): RIGHT ABOVE KNEE AMPUTATION APPLICATION OF WOUND VAC.    Patient was given perioperative antibiotics:  Anti-infectives (From admission, onward)    Start     Dose/Rate Route Frequency Ordered Stop   03/19/23 0600  ceFAZolin (ANCEF) IVPB 2g/100 mL premix        2 g 200 mL/hr over 30 Minutes Intravenous On call to O.R. 03/18/23 1215 03/18/23 1556   03/18/23 2300  ceFAZolin (ANCEF) IVPB 2g/100 mL premix        2 g 200 mL/hr over 30 Minutes Intravenous Every 8 hours 03/18/23 1752 03/20/23 1100     .  Patient was given sequential compression devices, early ambulation, and aspirin for DVT prophylaxis.  Recent vital signs: Patient Vitals for the past 24 hrs:  BP Temp Temp src Pulse Resp SpO2  03/21/23 0742 (!) 155/45 97.9 F (36.6 C) -- 63 17 97 %  03/21/23 0523 (!) 138/47 98.9 F (37.2 C) -- (!) 56 17 96 %  03/21/23 0110 (!) 144/41 98.9 F (37.2 C) -- 67 17 96 %  03/20/23 1531 (!) 126/44 98.9 F (37.2 C) Oral 65 17 96 %  03/20/23 0758 (!) 131/38 98.6 F (37 C) Oral 60 17 96 %  .  Recent laboratory studies: No results found.  Discharge Medications:   Allergies as of 03/21/2023   No Known Allergies      Medication List     STOP taking these medications    amoxicillin-clavulanate 875-125 MG tablet Commonly known as: AUGMENTIN   doxycycline 100 MG tablet Commonly known as: VIBRA-TABS       TAKE these medications     acetaminophen 325 MG tablet Commonly known as: TYLENOL Take 2 tablets (650 mg total) by mouth every 6 (six) hours as needed for mild pain (or Fever >/= 101).   furosemide 40 MG tablet Commonly known as: LASIX Take 1 tablet (40 mg total) by mouth daily.   ibuprofen 200 MG tablet Commonly known as: ADVIL Take 600 mg by mouth every 6 (six) hours as needed for fever, mild pain or moderate pain.   levothyroxine 150 MCG tablet Commonly known as: SYNTHROID Take 150 mcg by mouth daily before breakfast.   nitroGLYCERIN 0.4 MG SL tablet Commonly known as: NITROSTAT Place 1 tablet (0.4 mg total) under the tongue every 5 (five) minutes as needed for chest pain.   oxybutynin 15 MG 24 hr tablet Commonly known as: DITROPAN XL Take 15 mg by mouth daily.   PARoxetine 20 MG tablet Commonly known as: PAXIL Take 20 mg by mouth daily.        Diagnostic Studies: VAS [place] ABI WITH/WO TBI  Result Date: 03/02/2023  LOWER EXTREMITY DOPPLER STUDY Patient Name:  [name]  Date of Exam:   03/02/2023 Medical Rec #: [id number]            Accession #:    [id number] Date of Birth: [birth year]  Patient Gender: F Patient Age:   70 years Exam Location:  [facility] Procedure:      VAS [place] ABI WITH/WO TBI Referring Phys: Donn Wilmot --------------------------------------------------------------------------------  Indications: PVD with gangrene right lower extremity. High Risk Factors: Hypertension, coronary artery disease.  Limitations: Today's exam was limited due to patient positioning. Comparison Study: No previous study. Performing Technologist: McKayla Maag RVT, VT  Examination Guidelines: A complete evaluation includes at minimum, Doppler waveform signals and systolic blood pressure reading at the level of bilateral brachial, anterior tibial, and posterior tibial arteries, when vessel segments are accessible. Bilateral testing is considered an integral part of a complete examination.  Photoelectric Plethysmograph (PPG) waveforms and toe systolic pressure readings are included as required and additional duplex testing as needed. Limited examinations for reoccurring indications may be performed as noted.  ABI Findings: +---------+------------------+-----+----------+--------------------------------+ Right    Rt Pressure (mmHg)IndexWaveform  Comment                          +---------+------------------+-----+----------+--------------------------------+ Brachial                        triphasic No pressure obtained due to IV                                             placement.                       +---------+------------------+-----+----------+--------------------------------+ PTA                                       Not obtained due to patient                                                positioning                      +---------+------------------+-----+----------+--------------------------------+ DP       59                0.49 monophasic                                 +---------+------------------+-----+----------+--------------------------------+ Great Toe29                0.24 Abnormal                                   +---------+------------------+-----+----------+--------------------------------+ +---------+------------------+-----+----------+-------+ Left     Lt Pressure (mmHg)IndexWaveform  Comment +---------+------------------+-----+----------+-------+ Brachial 121                    biphasic          +---------+------------------+-----+----------+-------+ PTA      44                0.36 monophasic        +---------+------------------+-----+----------+-------+ DP       33  0.27 monophasic        +---------+------------------+-----+----------+-------+ Great Toe20                0.17 Abnormal          +---------+------------------+-----+----------+-------+  +-------+-----------+-----------+------------+------------+ ABI/TBIToday's ABIToday's TBIPrevious ABIPrevious TBI +-------+-----------+-----------+------------+------------+ Right  0.49       0.24                                +-------+-----------+-----------+------------+------------+ Left   0.36       0.17                                +-------+-----------+-----------+------------+------------+   Summary: Right: Resting right ankle-brachial index indicates severe right lower extremity arterial disease. The right toe-brachial index is abnormal. Left: Resting left ankle-brachial index indicates severe left lower extremity arterial disease. The left toe-brachial index is abnormal. *See table(s) above for measurements and observations.  Electronically signed by Heath Lark on 03/02/2023 at 6:09:29 PM.    Final    MR FOOT RIGHT W WO CONTRAST  Result Date: 03/01/2023 CLINICAL DATA:  Soft tissue infection suspected. Multiple wounds along the forefoot and big toe. EXAM: MRI OF THE RIGHT FOREFOOT WITHOUT AND WITH CONTRAST TECHNIQUE: Multiplanar, multisequence MR imaging of the right forefoot was performed before and after the administration of intravenous contrast. CONTRAST:  8mL GADAVIST GADOBUTROL 1 MMOL/ML IV SOLN COMPARISON:  Radiograph performed earlier on the same date. FINDINGS: Bones/Joint/Cartilage There is bone marrow edema of the proximal middle and distal phalanges of the third and fourth digits suggesting osteomyelitis. Marrow signal within remaining osseous structures is within normal limits. Marked degenerative changes of the first metatarsophalangeal joints with hallux valgus deformity. Ligaments Lisfranc ligament is intact. Muscles and Tendons Increased intramuscular signal of the plantar muscles suggesting diabetic myopathy/myositis. Soft tissues Marked subcutaneous soft tissue edema and inflammatory changes most prominent of the third and fourth digits. Generalized subcutaneous  soft tissue edema about the foot. No fluid collection or abscess. IMPRESSION: 1. Bone marrow edema of the proximal, middle and distal phalanges of the third and fourth digits consistent with osteomyelitis. 2. Marked subcutaneous soft tissue edema and inflammatory changes most prominent of the third and fourth digits consistent with cellulitis. No fluid collection or abscess. 3. Increased intramuscular signal of the plantar muscles suggesting diabetic myopathy/myositis. 4. Marked degenerative changes of the first metatarsophalangeal joints with hallux valgus deformity. Electronically Signed   By: Larose Hires D.O.   On: 03/01/2023 22:54   DG Foot 2 Views Right  Result Date: 03/01/2023 CLINICAL DATA:  Foot wound on bottom of right foot EXAM: RIGHT FOOT - 2 VIEW COMPARISON:  Radiographs 01/06/2023 FINDINGS: Demineralization. There is new cortical disruption and resorption about the head of the third toe proximal phalanx. The third toe mid and distal phalanx are not well visualized. Dislocation of the middle phalanx of the fourth toe in relation to the proximal phalanx. Plantar calcaneal spur. Vascular calcifications. Diffuse soft tissue swelling about the foot. Soft tissue ulcer about the dorsum of the foot at the MTP joint on lateral view. IMPRESSION: 1. Third toe osteomyelitis. 2. Dislocation of the right fourth toe middle phalanx. 3. Diffuse soft tissue swelling about the foot. Electronically Signed   By: Minerva Fester M.D.   On: 03/01/2023 19:58    Patient benefited maximally from their hospital stay and there were no complications.  Disposition: Discharge disposition: 62-Rehab Facility      Discharge Instructions     Call MD / Call 911   Complete by: As directed    If you experience chest pain or shortness of breath, CALL 911 and be transported to the hospital emergency room.  If you develope a fever above 101 F, pus (white drainage) or increased drainage or redness at the wound, or calf  pain, call your surgeon's office.   Constipation Prevention   Complete by: As directed    Drink plenty of fluids.  Prune juice may be helpful.  You may use a stool softener, such as Colace (over the counter) 100 mg twice a day.  Use MiraLax (over the counter) for constipation as needed.   Diet - low sodium heart healthy   Complete by: As directed    Increase activity slowly as tolerated   Complete by: As directed    Post-operative opioid taper instructions:   Complete by: As directed    POST-OPERATIVE OPIOID TAPER INSTRUCTIONS: It is important to wean off of your opioid medication as soon as possible. If you do not need pain medication after your surgery it is ok to stop day one. Opioids include: Codeine, Hydrocodone(Norco, Vicodin), Oxycodone(Percocet, oxycontin) and hydromorphone amongst others.  Long term and even short term use of opiods can cause: Increased pain response Dependence Constipation Depression Respiratory depression And more.  Withdrawal symptoms can include Flu like symptoms Nausea, vomiting And more Techniques to manage these symptoms Hydrate well Eat regular healthy meals Stay active Use relaxation techniques(deep breathing, meditating, yoga) Do Not substitute Alcohol to help with tapering If you have been on opioids for less than two weeks and do not have pain than it is ok to stop all together.  Plan to wean off of opioids This plan should start within one week post op of your joint replacement. Maintain the same interval or time between taking each dose and first decrease the dose.  Cut the total daily intake of opioids by one tablet each day Next start to increase the time between doses. The last dose that should be eliminated is the evening dose.          Follow-up Information     [name], MD Follow up in 1 week(s).   Specialty: Orthopedic Surgery Contact information: [address] [place] [address] [phone]                   Signed: [name] 03/21/2023, 7:49 AM

## 2023-03-21 NOTE — TOC Transition Note (Signed)
Transition of Care [facility]) - CM/SW Discharge Note   Patient Details  Name: [name] MRN: [id number] Date of Birth: 1933/02/17  [facility] CM/SW Contact:  Cornie Mccomber A [place], Theresia Majors Phone Number: 03/21/2023, 2:30 PM   Clinical Narrative:     Patient will DC to: [facility])  Anticipated DC date: 03/21/23   Family notified: Harrison Mons   Transport by: Theodoro Grist ID:  [id number]   Per MD patient ready for DC to [facility]. RN, patient, patient's family, and facility notified of DC. Discharge Summary and FL2 sent to facility. RN to call report prior to discharge ([phone], [phone]). DC packet on chart. Ambulance transport requested for patient.     CSW will sign off for now as social work intervention is no longer needed. Please consult [place] again if new needs arise.   Final next level of care: Skilled Nursing Facility Barriers to Discharge: Barriers Resolved   Patient Goals and CMS Choice      Discharge Placement                Patient chooses bed at: [facility] Patient to be transferred to facility by: [facility] Name of family member notified: Idell Hissong Patient and family notified of of transfer: 03/21/23  Discharge Plan and Services Additional resources added to the After Visit Summary for       Post Acute Care Choice: Skilled Nursing Facility                               Social Determinants of Health (SDOH) Interventions SDOH Screenings   Food Insecurity: No Food Insecurity (03/02/2023)  Housing: Patient Declined (03/02/2023)  Transportation Needs: No Transportation Needs (03/02/2023)  Utilities: Not At Risk (03/02/2023)  Financial Resource Strain: Low Risk  (01/28/2023)  Tobacco Use: Medium Risk (03/20/2023)     Readmission Risk Interventions    05/26/2022   11:22 AM  Readmission Risk Prevention Plan  Post Dischage Appt Complete  Medication Screening Complete   Transportation Screening Complete

## 2023-03-23 LAB — SURGICAL PATHOLOGY

## 2023-03-24 ENCOUNTER — Ambulatory Visit: Payer: Self-pay | Admitting: *Deleted

## 2023-03-24 NOTE — Patient Outreach (Signed)
  Care Coordination   03/24/2023 Name: [name] MRN: [id number] DOB: 04-24-1933   Care Coordination Outreach Attempts:  An unsuccessful telephone outreach was attempted for a scheduled appointment today.  Follow Up Plan:  Additional outreach attempts will be made to offer the patient care coordination information and services.   Encounter Outcome:  No Answer. Left HIPAA compliant VM for daughter, [name].   Care Coordination Interventions:  No, not indicated    Demetrios Loll, BSN, RN-BC RN Care Coordinator [facility]  [facility] Direct Dial: [phone] Main #: [phone]

## 2023-03-25 ENCOUNTER — Encounter: Payer: Self-pay | Admitting: Family

## 2023-03-25 ENCOUNTER — Ambulatory Visit (INDEPENDENT_AMBULATORY_CARE_PROVIDER_SITE_OTHER): Payer: 59 | Admitting: Family

## 2023-03-25 ENCOUNTER — Encounter: Payer: 59 | Admitting: Family

## 2023-03-25 DIAGNOSIS — Z89611 Acquired absence of right leg above knee: Secondary | ICD-10-CM

## 2023-03-25 NOTE — Progress Notes (Signed)
Post-Op Visit Note   Patient: [name]           Date of Birth: 1933-09-13           MRN: [id number] Visit Date: 03/25/2023 PCP: Assunta Found, MD  Chief Complaint:  Chief Complaint  Patient presents with   Right Leg - Routine Post Op    03/18/23 right AKA    HPI:  HPI The patient is an 89 year old woman who is seen status post right above-knee amputation currently residing at skilled nursing Ortho Exam On examination right residual limb this is well consolidated incision well approximately sutures there is no gaping or drainage  Visit Diagnoses: No diagnosis found.  Plan: Foam dressing applied begin daily dose of cleansing dry dressings continue to work with physical therapy.  Follow-up in 2 weeks for suture and staple removal  Follow-Up Instructions: No follow-ups on file.   Imaging: No results found.  Orders:  No orders of the defined types were placed in this encounter.  No orders of the defined types were placed in this encounter.    PMFS History: Patient Active Problem List   Diagnosis Date Noted   Gangrene of right foot (HCC) 03/18/2023   S/P AKA (above knee amputation) unilateral, right (HCC) 03/18/2023   PVD (peripheral vascular disease) (HCC) 03/02/2023   Osteomyelitis of toe of right foot (HCC) 03/01/2023   Dehydration 03/01/2023   Acute prerenal azotemia 03/01/2023   History of COPD 03/01/2023   Urinary incontinence 03/01/2023   Anemia of chronic disease 03/01/2023   Cellulitis 01/06/2023   Pressure ulcer of sacral region, stage 2 (HCC) 01/06/2023   Intertriginous dermatitis associated with moisture 01/06/2023   S/P TAVR (transcatheter aortic valve replacement) 05/25/2022   Nausea and vomiting 05/18/2022   Severe aortic stenosis    Depression 02/12/2017   Anxiety 02/12/2017   Hypokalemia 02/12/2017   Biliary colic    Acquired hypothyroidism    Hyperlipidemia    KNEE, ARTHRITIS, DEGEN./OSTEO 05/14/2010   Past Medical History:   Diagnosis Date   Anxiety    Arthritis    CAD (coronary artery disease)    Mild at cardiac catheterization 2018   Chronic bronchitis    Constipation    Depression    Dysrhythmia    afib 03/04/23   Hyperlipidemia    Hypokalemia 02/12/2017   Hypothyroidism    S/P TAVR (transcatheter aortic valve replacement) 05/25/2022   26mm S3UR via TF approach with Dr. Clifton James and Dr. Laneta Simmers   Severe aortic stenosis    Wound of left leg 01/07/2023    Family History  Problem Relation Age of Onset   Colon cancer Mother    Blindness Sister    Anesthesia problems Neg Hx    Hypotension Neg Hx    Malignant hyperthermia Neg Hx    Pseudochol deficiency Neg Hx     Past Surgical History:  Procedure Laterality Date   AMPUTATION Right 03/18/2023   Procedure: RIGHT ABOVE KNEE AMPUTATION;  Surgeon: [name], MD;  Location: [facility] OR;  Service: Orthopedics;  Laterality: Right;   APPLICATION OF WOUND VAC Right 03/18/2023   Procedure: APPLICATION OF WOUND VAC;  Surgeon: [name], MD;  Location: [facility] OR;  Service: Orthopedics;  Laterality: Right;   BUNIONECTOMY Left 1975   CARDIAC CATHETERIZATION  07/04/2020   CHOLECYSTECTOMY N/A 02/15/2017   Procedure: LAPAROSCOPIC CHOLECYSTECTOMY;  Surgeon: Axel Filler, MD;  Location: [facility] OR;  Service: General;  Laterality: N/A;   CORONARY ANGIOGRAPHY N/A 02/14/2017  Procedure: Coronary Angiography;  Surgeon: Yvonne Kendall, MD;  Location: [facility] INVASIVE CV LAB;  Service: Cardiovascular;  Laterality: N/A;   EYE SURGERY  2009   bilateral   HEMATOMA EVACUATION  06/04/2011   Procedure: EVACUATION HEMATOMA;  Surgeon: Fuller Canada, MD;  Location: [facility] ORS;  Service: Orthopedics;  Laterality: Right;  evacuation of seroma   INTRAOPERATIVE TRANSTHORACIC ECHOCARDIOGRAM N/A 05/25/2022   Procedure: INTRAOPERATIVE TRANSTHORACIC ECHOCARDIOGRAM;  Surgeon: Kathleene Hazel, MD;  Location: [facility] INVASIVE CV LAB;  Service: Open Heart Surgery;  Laterality: N/A;   JOINT  REPLACEMENT  05/2010   left total knee Dr. Romeo Apple   PATELLAR TENDON REPAIR  06/04/2011   Procedure: PATELLA TENDON REPAIR;  Surgeon: Fuller Canada, MD;  Location: [facility] ORS;  Service: Orthopedics;  Laterality: Right;   PATELLAR TENDON REPAIR  08/30/2011   Procedure: PATELLA TENDON REPAIR;  Surgeon: Fuller Canada, MD;  Location: [facility] ORS;  Service: Orthopedics;  Laterality: Right;  Allograft Reconstrucation Right Patella Tendon   RIGHT/LEFT HEART CATH AND CORONARY ANGIOGRAPHY N/A 07/04/2020   Procedure: RIGHT/LEFT HEART CATH AND CORONARY ANGIOGRAPHY;  Surgeon: Kathleene Hazel, MD;  Location: [facility] INVASIVE CV LAB;  Service: Cardiovascular;  Laterality: N/A;   TOTAL HIP ARTHROPLASTY Left 1998   TOTAL KNEE ARTHROPLASTY  05/03/2011   Procedure: TOTAL KNEE ARTHROPLASTY;  Surgeon: Fuller Canada, MD;  Location: [facility] ORS;  Service: Orthopedics;  Laterality: Right;  With DePuy   TOTAL THYROIDECTOMY  11/2009   Dr. Suszanne Conners @ [facility]   TRANSCATHETER AORTIC VALVE REPLACEMENT, TRANSFEMORAL Right 05/25/2022   Procedure: Transcatheter Aortic Valve Replacement, Transfemoral;  Surgeon: Kathleene Hazel, MD;  Location: [facility] INVASIVE CV LAB;  Service: Open Heart Surgery;  Laterality: Right;   TUBAL LIGATION     Social History   Occupational History   Occupation: Retired-Office work  Tobacco Use   Smoking status: Former    Types: Cigarettes    Quit date: 09/27/1994    Years since quitting: 28.5   Smokeless tobacco: Never  Vaping Use   Vaping Use: Never used  Substance and Sexual Activity   Alcohol use: No   Drug use: No   Sexual activity: Not Currently

## 2023-03-30 ENCOUNTER — Other Ambulatory Visit: Payer: Self-pay | Admitting: *Deleted

## 2023-03-30 NOTE — Patient Outreach (Signed)
Per [facility] Mrs. Brakeman resides in [facility] skilled nursing facility. She has been active with [facility] care coordination team prior to admission.   Mrs. Shover recently returned from hospital to [facility] after right AKA.  Secure communication sent to Richardean Sale social worker for collaboration about transition plans.  Will continue to follow and plan outreach to family accordingly. Will keep [facility] RNCM updated.   Raiford Noble, MSN, RN,BSN [facility] Acute Care Coordinator [phone] (Direct dial)

## 2023-04-04 ENCOUNTER — Telehealth: Payer: Self-pay | Admitting: *Deleted

## 2023-04-04 NOTE — Progress Notes (Signed)
  Care Coordination Note  04/04/2023 Name: BENJI CABALLEROS MRN: [id number] DOB: [birth year]  [name] is a 89 y.o. year old female who is a primary care patient of Assunta Found, MD and is actively engaged with the care management team. I reached out to Daleen Bo by phone today to assist with re-scheduling a follow up visit with the RN Case Manager  Follow up plan: The patient has been provided with contact information for the care management team and has been advised to call with any health related questions or concerns.    [facility]  Care Coordination Care Guide  Direct Dial: [phone]

## 2023-04-05 ENCOUNTER — Other Ambulatory Visit: Payer: Self-pay | Admitting: *Deleted

## 2023-04-05 NOTE — Patient Outreach (Signed)
Post- Acute Care Coordinator follow up. Mrs. Silvestro resides in [facility]. Mrs. Alwine was active with [facility] care coordination team prior to admission.   Previous update received from Lucas, [facility] Child psychotherapist. Shawna Orleans reports family is onboard with long term care. Shawna Orleans reports Mrs. Sessler keeps talking about returning home however.    Telephone call made to daughter Aidah Forquer [phone]. Patient identifiers confirmed. [name] reports she has applied for long term Medicaid. States she plans to follow up with Medicaid case worker, Kristin Bruins tomorrow when she is off of work. [name] states the plan is for long term care for Mrs. Tweten as is it the best and safest option than to return home alone.   Writer will continue to follow. Will update [facility] care coordination team.   Raiford Noble, MSN, RN,BSN [facility] Post Acute Care Coordinator [phone] (Direct dial)

## 2023-04-08 ENCOUNTER — Ambulatory Visit (INDEPENDENT_AMBULATORY_CARE_PROVIDER_SITE_OTHER): Payer: 59 | Admitting: Family

## 2023-04-08 DIAGNOSIS — Z89611 Acquired absence of right leg above knee: Secondary | ICD-10-CM

## 2023-04-14 ENCOUNTER — Encounter: Payer: Self-pay | Admitting: Orthopedic Surgery

## 2023-04-14 ENCOUNTER — Ambulatory Visit (INDEPENDENT_AMBULATORY_CARE_PROVIDER_SITE_OTHER): Payer: 59 | Admitting: Orthopedic Surgery

## 2023-04-14 DIAGNOSIS — Z89611 Acquired absence of right leg above knee: Secondary | ICD-10-CM

## 2023-04-14 NOTE — Progress Notes (Signed)
Office Visit Note   Patient: [name]           Date of Birth: June 24, 1933           MRN: [id number] Visit Date: 04/14/2023              Requested by: Assunta Found, MD [address] [place],  [place] [address] PCP: Assunta Found, MD  No chief complaint on file.     HPI: Patient is an 89 year old woman who is 4 weeks status post right above-the-knee amputation.  Patient has had drainage from the limb.  Assessment & Plan: Visit Diagnoses:  1. S/P AKA (above knee amputation) unilateral, right (HCC)     Plan: The medial wound to be washed with soap and water daily packed open with 4 x 4 gauze.  A prescription for doxycycline.  Follow-Up Instructions: Return in about 2 weeks (around 04/28/2023).   Ortho Exam  Patient is alert, oriented, no adenopathy, well-dressed, normal affect, normal respiratory effort. Examination there is wound breakdown medially left above-knee amputation.  The wound is approximately 2 cm in diameter and 2 cm deep.  There is clear serosanguineous drainage that was expressed from the wound.  There is no purulence.  No discolored drainage.  Will start Dial soap cleansing packing with 4 x 4 gauze and a prescription for doxycycline.  There is no cellulitis.  No pain to palpation.  Imaging: No results found.   Labs: Lab Results  Component Value Date   HGBA1C 5.1 01/07/2023   ESRSEDRATE 33 (H) 03/01/2023   ESRSEDRATE 44 (H) 01/06/2023   ESRSEDRATE 44 (H) 05/29/2011   CRP 2.9 (H) 03/01/2023   CRP 3.4 (H) 01/06/2023   CRP 20.27 (H) 05/29/2011   REPTSTATUS 03/06/2023 FINAL 03/01/2023   GRAMSTAIN  06/04/2011    ABUNDANT WBC PRESENT, PREDOMINANTLY PMN NO SQUAMOUS EPITHELIAL CELLS SEEN NO ORGANISMS SEEN   GRAMSTAIN  06/04/2011    ABUNDANT WBC PRESENT, PREDOMINANTLY PMN NO SQUAMOUS EPITHELIAL CELLS SEEN NO ORGANISMS SEEN   CULT  03/01/2023    NO GROWTH 5 DAYS Performed at [facility], [address], [place], [place] [address]       Lab Results  Component Value Date   ALBUMIN 2.5 (L) 03/02/2023   ALBUMIN 2.8 (L) 03/01/2023   ALBUMIN 3.2 (L) 01/06/2023    Lab Results  Component Value Date   MG 1.8 03/02/2023   MG 2.0 03/01/2023   MG 1.9 05/26/2022   No results found for: "VD25OH"  No results found for: "PREALBUMIN"    Latest Ref Rng & Units 03/20/2023    5:33 AM 03/19/2023    6:15 AM 03/02/2023    2:42 AM  CBC EXTENDED  WBC 4.0 - 10.5 K/uL 7.8  9.7  8.0   RBC 3.87 - 5.11 MIL/uL 3.05  3.26  3.68   Hemoglobin 12.0 - 15.0 g/dL 8.7  9.2  82.9   HCT 56.2 - 46.0 % 26.8  28.9  32.5   Platelets 150 - 400 K/uL 357  422  235   NEUT# 1.7 - 7.7 K/uL   6.1   Lymph# 0.7 - 4.0 K/uL   1.4      There is no height or weight on file to calculate BMI.  Orders:  No orders of the defined types were placed in this encounter.  No orders of the defined types were placed in this encounter.    Procedures: No procedures performed  Clinical Data: No additional findings.  ROS:  All other systems negative, except as noted in the HPI. Review of Systems  Objective: Vital Signs: There were no vitals taken for this visit.  Specialty Comments:  No specialty comments available.  PMFS History: Patient Active Problem List   Diagnosis Date Noted   Gangrene of right foot (HCC) 03/18/2023   S/P AKA (above knee amputation) unilateral, right (HCC) 03/18/2023   PVD (peripheral vascular disease) (HCC) 03/02/2023   Osteomyelitis of toe of right foot (HCC) 03/01/2023   Dehydration 03/01/2023   Acute prerenal azotemia 03/01/2023   History of COPD 03/01/2023   Urinary incontinence 03/01/2023   Anemia of chronic disease 03/01/2023   Cellulitis 01/06/2023   Pressure ulcer of sacral region, stage 2 (HCC) 01/06/2023   Intertriginous dermatitis associated with moisture 01/06/2023   S/P TAVR (transcatheter aortic valve replacement) 05/25/2022   Nausea and vomiting 05/18/2022   Severe aortic stenosis    Depression  02/12/2017   Anxiety 02/12/2017   Hypokalemia 02/12/2017   Biliary colic    Acquired hypothyroidism    Hyperlipidemia    KNEE, ARTHRITIS, DEGEN./OSTEO 05/14/2010   Past Medical History:  Diagnosis Date   Anxiety    Arthritis    CAD (coronary artery disease)    Mild at cardiac catheterization 2018   Chronic bronchitis    Constipation    Depression    Dysrhythmia    afib 03/04/23   Hyperlipidemia    Hypokalemia 02/12/2017   Hypothyroidism    S/P TAVR (transcatheter aortic valve replacement) 05/25/2022   26mm S3UR via TF approach with Dr. Clifton James and Dr. Laneta Simmers   Severe aortic stenosis    Wound of left leg 01/07/2023    Family History  Problem Relation Age of Onset   Colon cancer Mother    Blindness Sister    Anesthesia problems Neg Hx    Hypotension Neg Hx    Malignant hyperthermia Neg Hx    Pseudochol deficiency Neg Hx     Past Surgical History:  Procedure Laterality Date   AMPUTATION Right 03/18/2023   Procedure: RIGHT ABOVE KNEE AMPUTATION;  Surgeon: [name], MD;  Location: [facility] OR;  Service: Orthopedics;  Laterality: Right;   APPLICATION OF WOUND VAC Right 03/18/2023   Procedure: APPLICATION OF WOUND VAC;  Surgeon: [name], MD;  Location: [facility] OR;  Service: Orthopedics;  Laterality: Right;   BUNIONECTOMY Left 1975   CARDIAC CATHETERIZATION  07/04/2020   CHOLECYSTECTOMY N/A 02/15/2017   Procedure: LAPAROSCOPIC CHOLECYSTECTOMY;  Surgeon: Axel Filler, MD;  Location: [facility] OR;  Service: General;  Laterality: N/A;   CORONARY ANGIOGRAPHY N/A 02/14/2017   Procedure: Coronary Angiography;  Surgeon: Yvonne Kendall, MD;  Location: [facility] INVASIVE CV LAB;  Service: Cardiovascular;  Laterality: N/A;   EYE SURGERY  2009   bilateral   HEMATOMA EVACUATION  06/04/2011   Procedure: EVACUATION HEMATOMA;  Surgeon: Fuller Canada, MD;  Location: [facility] ORS;  Service: Orthopedics;  Laterality: Right;  evacuation of seroma   INTRAOPERATIVE TRANSTHORACIC ECHOCARDIOGRAM N/A  05/25/2022   Procedure: INTRAOPERATIVE TRANSTHORACIC ECHOCARDIOGRAM;  Surgeon: Kathleene Hazel, MD;  Location: [facility] INVASIVE CV LAB;  Service: Open Heart Surgery;  Laterality: N/A;   JOINT REPLACEMENT  05/2010   left total knee Dr. Romeo Apple   PATELLAR TENDON REPAIR  06/04/2011   Procedure: PATELLA TENDON REPAIR;  Surgeon: Fuller Canada, MD;  Location: [facility] ORS;  Service: Orthopedics;  Laterality: Right;   PATELLAR TENDON REPAIR  08/30/2011   Procedure: PATELLA TENDON REPAIR;  Surgeon: Fuller Canada,  MD;  Location: [facility] ORS;  Service: Orthopedics;  Laterality: Right;  Allograft Reconstrucation Right Patella Tendon   RIGHT/LEFT HEART CATH AND CORONARY ANGIOGRAPHY N/A 07/04/2020   Procedure: RIGHT/LEFT HEART CATH AND CORONARY ANGIOGRAPHY;  Surgeon: Kathleene Hazel, MD;  Location: [facility] LAB;  Service: Cardiovascular;  Laterality: N/A;   TOTAL HIP ARTHROPLASTY Left 1998   TOTAL KNEE ARTHROPLASTY  05/03/2011   Procedure: TOTAL KNEE ARTHROPLASTY;  Surgeon: Fuller Canada, MD;  Location: [facility];  Service: Orthopedics;  Laterality: Right;  With DePuy   TOTAL THYROIDECTOMY  11/2009   Dr. Suszanne Conners @ [facility]   TRANSCATHETER AORTIC VALVE REPLACEMENT, TRANSFEMORAL Right 05/25/2022   Procedure: Transcatheter Aortic Valve Replacement, Transfemoral;  Surgeon: Kathleene Hazel, MD;  Location: [facility] INVASIVE CV LAB;  Service: Open Heart Surgery;  Laterality: Right;   TUBAL LIGATION     Social History   Occupational History   Occupation: Retired-Office work  Tobacco Use   Smoking status: Former    Current packs/day: 0.00    Types: Cigarettes    Quit date: 09/27/1994    Years since quitting: 28.5   Smokeless tobacco: Never  Vaping Use   Vaping status: Never Used  Substance and Sexual Activity   Alcohol use: No   Drug use: No   Sexual activity: Not Currently

## 2023-04-18 ENCOUNTER — Encounter: Payer: Self-pay | Admitting: Family

## 2023-04-18 NOTE — Progress Notes (Signed)
Post-Op Visit Note   Patient: [name]           Date of Birth: July 19, 1933           MRN: [id number] Visit Date: 04/08/2023 PCP: Assunta Found, MD  Chief Complaint:  Chief Complaint  Patient presents with   Right Leg - Routine Post Op    03/18/23 right AKA    HPI:  HPI The patient is an 89 year old woman who presents status post above-knee amputation on the right.  She is currently residing at skilled nursing. Ortho Exam On examination of the right above-knee amputation this is well healed.  Staples harvested.  Patient tolerated well.  No gaping no drainage no erythema  Visit Diagnoses: No diagnosis found.  Plan: Continue daily Dial soap cleansing.  Follow-up in the office as needed.  Follow-Up Instructions: No follow-ups on file.   Imaging: No results found.  Orders:  No orders of the defined types were placed in this encounter.  No orders of the defined types were placed in this encounter.    PMFS History: Patient Active Problem List   Diagnosis Date Noted   Gangrene of right foot (HCC) 03/18/2023   S/P AKA (above knee amputation) unilateral, right (HCC) 03/18/2023   PVD (peripheral vascular disease) (HCC) 03/02/2023   Osteomyelitis of toe of right foot (HCC) 03/01/2023   Dehydration 03/01/2023   Acute prerenal azotemia 03/01/2023   History of COPD 03/01/2023   Urinary incontinence 03/01/2023   Anemia of chronic disease 03/01/2023   Cellulitis 01/06/2023   Pressure ulcer of sacral region, stage 2 (HCC) 01/06/2023   Intertriginous dermatitis associated with moisture 01/06/2023   S/P TAVR (transcatheter aortic valve replacement) 05/25/2022   Nausea and vomiting 05/18/2022   Severe aortic stenosis    Depression 02/12/2017   Anxiety 02/12/2017   Hypokalemia 02/12/2017   Biliary colic    Acquired hypothyroidism    Hyperlipidemia    KNEE, ARTHRITIS, DEGEN./OSTEO 05/14/2010   Past Medical History:  Diagnosis Date   Anxiety    Arthritis    CAD  (coronary artery disease)    Mild at cardiac catheterization 2018   Chronic bronchitis    Constipation    Depression    Dysrhythmia    afib 03/04/23   Hyperlipidemia    Hypokalemia 02/12/2017   Hypothyroidism    S/P TAVR (transcatheter aortic valve replacement) 05/25/2022   26mm S3UR via TF approach with Dr. Clifton James and Dr. Laneta Simmers   Severe aortic stenosis    Wound of left leg 01/07/2023    Family History  Problem Relation Age of Onset   Colon cancer Mother    Blindness Sister    Anesthesia problems Neg Hx    Hypotension Neg Hx    Malignant hyperthermia Neg Hx    Pseudochol deficiency Neg Hx     Past Surgical History:  Procedure Laterality Date   AMPUTATION Right 03/18/2023   Procedure: RIGHT ABOVE KNEE AMPUTATION;  Surgeon: [name], MD;  Location: [facility] OR;  Service: Orthopedics;  Laterality: Right;   APPLICATION OF WOUND VAC Right 03/18/2023   Procedure: APPLICATION OF WOUND VAC;  Surgeon: [name], MD;  Location: [facility] OR;  Service: Orthopedics;  Laterality: Right;   BUNIONECTOMY Left 1975   CARDIAC CATHETERIZATION  07/04/2020   CHOLECYSTECTOMY N/A 02/15/2017   Procedure: LAPAROSCOPIC CHOLECYSTECTOMY;  Surgeon: Axel Filler, MD;  Location: [facility] OR;  Service: General;  Laterality: N/A;   CORONARY ANGIOGRAPHY N/A 02/14/2017   Procedure: Coronary  Angiography;  Surgeon: Yvonne Kendall, MD;  Location: [facility] LAB;  Service: Cardiovascular;  Laterality: N/A;   EYE SURGERY  2009   bilateral   HEMATOMA EVACUATION  06/04/2011   Procedure: EVACUATION HEMATOMA;  Surgeon: Fuller Canada, MD;  Location: [facility]S;  Service: Orthopedics;  Laterality: Right;  evacuation of seroma   INTRAOPERATIVE TRANSTHORACIC ECHOCARDIOGRAM N/A 05/25/2022   Procedure: INTRAOPERATIVE TRANSTHORACIC ECHOCARDIOGRAM;  Surgeon: Kathleene Hazel, MD;  [facility]: [facility];  Service: Open Heart Surgery;  Laterality: N/A;   JOINT REPLACEMENT  05/2010   left total knee Dr. Romeo Apple    PATELLAR TENDON REPAIR  06/04/2011   Procedure: PATELLA TENDON REPAIR;  Surgeon: Fuller Canada, MD;  Location: [facility];  Service: Orthopedics;  Laterality: Right;   PATELLAR TENDON REPAIR  08/30/2011   Procedure: PATELLA TENDON REPAIR;  Surgeon: Fuller Canada, MD;  Location: [facility];  Service: Orthopedics;  Laterality: Right;  Allograft Reconstrucation Right Patella Tendon   RIGHT/LEFT HEART CATH AND CORONARY ANGIOGRAPHY N/A 07/04/2020   Procedure: RIGHT/LEFT HEART CATH AND CORONARY ANGIOGRAPHY;  Surgeon: Kathleene Hazel, MD;  [facility];  Service: Cardiovascular;  Laterality: N/A;   TOTAL HIP ARTHROPLASTY Left 1998   TOTAL KNEE ARTHROPLASTY  05/03/2011   Procedure: TOTAL KNEE ARTHROPLASTY;  Surgeon: Fuller Canada, MD;  Location: [facility];  Service: Orthopedics;  Laterality: Right;  With DePuy   TOTAL THYROIDECTOMY  11/2009   Dr. Suszanne Conners @ [facility]   TRANSCATHETER AORTIC VALVE REPLACEMENT, TRANSFEMORAL Right 05/25/2022   Procedure: Transcatheter Aortic Valve Replacement, Transfemoral;  Surgeon: Kathleene Hazel, MD;  Location: [facility] INVASIVE CV LAB;  Service: Open Heart Surgery;  Laterality: Right;   TUBAL LIGATION     Social History   Occupational History   Occupation: Retired-Office work  Tobacco Use   Smoking status: Former    Current packs/day: 0.00    Types: Cigarettes    Quit date: 09/27/1994    Years since quitting: 28.5   Smokeless tobacco: Never  Vaping Use   Vaping status: Never Used  Substance and Sexual Activity   Alcohol use: No   Drug use: No   Sexual activity: Not Currently

## 2023-04-20 ENCOUNTER — Other Ambulatory Visit: Payer: Self-pay | Admitting: *Deleted

## 2023-04-20 NOTE — Patient Outreach (Signed)
Post- Acute Care Coordinator follow up. [name] resides in [facility] skilled nursing facility.  Update received from Richardean Sale social worker indicating [name] will need long term care placement.   Will continue to follow.   Raiford Noble, MSN, RN,BSN [facility] Post Acute Care Coordinator [phone] (Direct dial)

## 2023-04-21 DIAGNOSIS — I7389 Other specified peripheral vascular diseases: Secondary | ICD-10-CM | POA: Diagnosis not present

## 2023-04-21 DIAGNOSIS — J449 Chronic obstructive pulmonary disease, unspecified: Secondary | ICD-10-CM | POA: Diagnosis not present

## 2023-04-21 DIAGNOSIS — S88011A Complete traumatic amputation at knee level, right lower leg, initial encounter: Secondary | ICD-10-CM | POA: Diagnosis not present

## 2023-04-21 DIAGNOSIS — E038 Other specified hypothyroidism: Secondary | ICD-10-CM | POA: Diagnosis not present

## 2023-04-28 ENCOUNTER — Ambulatory Visit (INDEPENDENT_AMBULATORY_CARE_PROVIDER_SITE_OTHER): Payer: 59 | Admitting: Orthopedic Surgery

## 2023-04-28 DIAGNOSIS — Z89611 Acquired absence of right leg above knee: Secondary | ICD-10-CM

## 2023-04-28 DIAGNOSIS — M79672 Pain in left foot: Secondary | ICD-10-CM | POA: Diagnosis not present

## 2023-04-28 DIAGNOSIS — M62562 Muscle wasting and atrophy, not elsewhere classified, left lower leg: Secondary | ICD-10-CM | POA: Diagnosis not present

## 2023-04-28 DIAGNOSIS — L97521 Non-pressure chronic ulcer of other part of left foot limited to breakdown of skin: Secondary | ICD-10-CM | POA: Diagnosis not present

## 2023-04-28 DIAGNOSIS — M62522 Muscle wasting and atrophy, not elsewhere classified, left upper arm: Secondary | ICD-10-CM | POA: Diagnosis not present

## 2023-04-28 DIAGNOSIS — R2689 Other abnormalities of gait and mobility: Secondary | ICD-10-CM | POA: Diagnosis not present

## 2023-04-29 DIAGNOSIS — R2689 Other abnormalities of gait and mobility: Secondary | ICD-10-CM | POA: Diagnosis not present

## 2023-04-29 DIAGNOSIS — M62522 Muscle wasting and atrophy, not elsewhere classified, left upper arm: Secondary | ICD-10-CM | POA: Diagnosis not present

## 2023-04-29 DIAGNOSIS — M62562 Muscle wasting and atrophy, not elsewhere classified, left lower leg: Secondary | ICD-10-CM | POA: Diagnosis not present

## 2023-05-02 ENCOUNTER — Telehealth: Payer: Self-pay | Admitting: *Deleted

## 2023-05-02 ENCOUNTER — Encounter: Payer: Self-pay | Admitting: *Deleted

## 2023-05-02 DIAGNOSIS — M62522 Muscle wasting and atrophy, not elsewhere classified, left upper arm: Secondary | ICD-10-CM | POA: Diagnosis not present

## 2023-05-02 DIAGNOSIS — R2689 Other abnormalities of gait and mobility: Secondary | ICD-10-CM | POA: Diagnosis not present

## 2023-05-02 DIAGNOSIS — M62562 Muscle wasting and atrophy, not elsewhere classified, left lower leg: Secondary | ICD-10-CM | POA: Diagnosis not present

## 2023-05-02 NOTE — Patient Outreach (Signed)
  Care Coordination   Follow Up Visit Note   05/02/2023 Name: [name] MRN: [id number] DOB: Feb 20, 1933  [name] is a 89 y.o. year old female who sees Assunta Found, MD for primary care. I  spoke with patient's daughter, [name], by telephone today.  What matters to the patients health and wellness today?  Wound healing S/P right AKA   Goals Addressed             This Visit's Progress    COMPLETED: Manage Blood Pressure       Care Coordination Goals: Patient will take medications as directed and report any negative side effects to provider  Patient will take medications as prescribed Patient will work with physicians and staff at [facility] to manage blood pressure Patient will follow a low sodium/DASH diet      COMPLETED: Wound Improvement       Care Coordination Goals: Patient will keep all medical appointments Patient will take medications as prescribed Patient will increase protein intake to aid in wound healing Patient will work with wound care specialists at [facility]       SDOH assessments and interventions completed:  Yes  SDOH Interventions Today    Flowsheet Row Most Recent Value  SDOH Interventions   Transportation Interventions Intervention Not Indicated  Financial Strain Interventions Intervention Not Indicated        Care Coordination Interventions:  Yes, provided  Interventions Today    Flowsheet Row Most Recent Value  Chronic Disease   Chronic disease during today's visit Other  [PVD]  General Interventions   General Interventions Discussed/Reviewed General Interventions Discussed, General Interventions Reviewed, Durable Medical Equipment (DME), Level of Care, Doctor Visits  Doctor Visits Discussed/Reviewed Doctor Visits Discussed, Doctor Visits Reviewed, Specialist  [reviewed and discussed recent hospital visits and orthopedic surgeon visits]  Durable Medical Equipment (DME) Wheelchair  Wheelchair Standard   PCP/Specialist Visits Compliance with follow-up visit  [orthopedic surgeon, Dr Lajoyce Corners, next week]  Level of Care Skilled Nursing Facility  [pt was discharged to [facility] for rehab after right leg amputation. Daughters have decided that permanent placement is best. They are still waiting on Medicaid approval, but they don't feel it will be a problem.]  Exercise Interventions   Exercise Discussed/Reviewed Physical Activity  Physical Activity Discussed/Reviewed Physical Activity Discussed, Physical Activity Reviewed  [limited by age and right leg amputation]  Education Interventions   Education Provided Provided Education  Provided Verbal Education On Nutrition, When to see the doctor, Medication  Nutrition Interventions   Nutrition Discussed/Reviewed Nutrition Discussed, Nutrition Reviewed, Increasing proteins  [Increase protein to aid in wound healing]  Pharmacy Interventions   Pharmacy Dicussed/Reviewed Pharmacy Topics Discussed, Pharmacy Topics Reviewed, Medications and their functions  [difficult to manage phantom pain. Will talk with provider.]  Safety Interventions   Safety Discussed/Reviewed Safety Discussed, Safety Reviewed, Fall Risk, Home Safety  Home Safety Assistive Devices  Advanced Directive Interventions   Advanced Directives Discussed/Reviewed Advanced Directives Discussed, Advanced Directives Reviewed       Follow up plan: No further intervention required.   Encounter Outcome:  Pt. Visit Completed   Demetrios Loll, BSN, RN-BC RN Care Coordinator [facility]  [facility] Direct Dial: [phone] Main #: [phone]

## 2023-05-03 DIAGNOSIS — M62562 Muscle wasting and atrophy, not elsewhere classified, left lower leg: Secondary | ICD-10-CM | POA: Diagnosis not present

## 2023-05-03 DIAGNOSIS — M62522 Muscle wasting and atrophy, not elsewhere classified, left upper arm: Secondary | ICD-10-CM | POA: Diagnosis not present

## 2023-05-03 DIAGNOSIS — R2689 Other abnormalities of gait and mobility: Secondary | ICD-10-CM | POA: Diagnosis not present

## 2023-05-04 DIAGNOSIS — M62522 Muscle wasting and atrophy, not elsewhere classified, left upper arm: Secondary | ICD-10-CM | POA: Diagnosis not present

## 2023-05-04 DIAGNOSIS — R2689 Other abnormalities of gait and mobility: Secondary | ICD-10-CM | POA: Diagnosis not present

## 2023-05-04 DIAGNOSIS — M62562 Muscle wasting and atrophy, not elsewhere classified, left lower leg: Secondary | ICD-10-CM | POA: Diagnosis not present

## 2023-05-05 DIAGNOSIS — M62522 Muscle wasting and atrophy, not elsewhere classified, left upper arm: Secondary | ICD-10-CM | POA: Diagnosis not present

## 2023-05-05 DIAGNOSIS — R2689 Other abnormalities of gait and mobility: Secondary | ICD-10-CM | POA: Diagnosis not present

## 2023-05-05 DIAGNOSIS — M62562 Muscle wasting and atrophy, not elsewhere classified, left lower leg: Secondary | ICD-10-CM | POA: Diagnosis not present

## 2023-05-06 DIAGNOSIS — R2689 Other abnormalities of gait and mobility: Secondary | ICD-10-CM | POA: Diagnosis not present

## 2023-05-06 DIAGNOSIS — M62522 Muscle wasting and atrophy, not elsewhere classified, left upper arm: Secondary | ICD-10-CM | POA: Diagnosis not present

## 2023-05-06 DIAGNOSIS — M62562 Muscle wasting and atrophy, not elsewhere classified, left lower leg: Secondary | ICD-10-CM | POA: Diagnosis not present

## 2023-05-09 DIAGNOSIS — M62522 Muscle wasting and atrophy, not elsewhere classified, left upper arm: Secondary | ICD-10-CM | POA: Diagnosis not present

## 2023-05-09 DIAGNOSIS — M62562 Muscle wasting and atrophy, not elsewhere classified, left lower leg: Secondary | ICD-10-CM | POA: Diagnosis not present

## 2023-05-09 DIAGNOSIS — R2689 Other abnormalities of gait and mobility: Secondary | ICD-10-CM | POA: Diagnosis not present

## 2023-05-10 DIAGNOSIS — M62522 Muscle wasting and atrophy, not elsewhere classified, left upper arm: Secondary | ICD-10-CM | POA: Diagnosis not present

## 2023-05-10 DIAGNOSIS — M62562 Muscle wasting and atrophy, not elsewhere classified, left lower leg: Secondary | ICD-10-CM | POA: Diagnosis not present

## 2023-05-10 DIAGNOSIS — R2689 Other abnormalities of gait and mobility: Secondary | ICD-10-CM | POA: Diagnosis not present

## 2023-05-11 DIAGNOSIS — R2689 Other abnormalities of gait and mobility: Secondary | ICD-10-CM | POA: Diagnosis not present

## 2023-05-11 DIAGNOSIS — M62522 Muscle wasting and atrophy, not elsewhere classified, left upper arm: Secondary | ICD-10-CM | POA: Diagnosis not present

## 2023-05-11 DIAGNOSIS — M62562 Muscle wasting and atrophy, not elsewhere classified, left lower leg: Secondary | ICD-10-CM | POA: Diagnosis not present

## 2023-05-12 ENCOUNTER — Encounter: Payer: Self-pay | Admitting: Orthopedic Surgery

## 2023-05-12 DIAGNOSIS — R2689 Other abnormalities of gait and mobility: Secondary | ICD-10-CM | POA: Diagnosis not present

## 2023-05-12 DIAGNOSIS — M62562 Muscle wasting and atrophy, not elsewhere classified, left lower leg: Secondary | ICD-10-CM | POA: Diagnosis not present

## 2023-05-12 DIAGNOSIS — M62522 Muscle wasting and atrophy, not elsewhere classified, left upper arm: Secondary | ICD-10-CM | POA: Diagnosis not present

## 2023-05-12 NOTE — Progress Notes (Signed)
Office Visit Note   Patient: [name]           Date of Birth: 14-May-1933           MRN: [id number] Visit Date: 04/28/2023              Requested by: Assunta Found, MD [address] [place],  [place] [address] PCP: Assunta Found, MD  Chief Complaint  Patient presents with   Right Leg - Routine Post Op    03/18/23 right AKA      HPI: Patient is a 89 year old gentleman who is status post right above-knee amputation June 21.  She has a wound that is packed open with gauze she is on doxycycline.  Patient has seen Dr. Lenell Antu June 6 and she is not a revascularization candidate.  Assessment & Plan: Visit Diagnoses:  1. S/P AKA (above knee amputation) unilateral, right (HCC)   2. Non-pressure chronic ulcer of other part of left foot limited to breakdown of skin (HCC)     Plan: Continue to pack the wound open.  Left foot with ischemic ulceration continue with the [facility] boot to unload pressure.  Follow-Up Instructions: No follow-ups on file.   Ortho Exam  Patient is alert, oriented, no adenopathy, well-dressed, normal affect, normal respiratory effort. Examination patient has critical limb ischemia the Doppler on the left shows dampened monophasic flow her most recent ABI was 0.36 there is an ulcer over the right above-knee amputation is 2 cm in diameter 2 cm deep.  Patient has an ischemic ulcer over the lateral left foot and lateral heel with swelling of the toes.  No palpable pulse.  Imaging: No results found.      Labs: Lab Results  Component Value Date   HGBA1C 5.1 01/07/2023   ESRSEDRATE 33 (H) 03/01/2023   ESRSEDRATE 44 (H) 01/06/2023   ESRSEDRATE 44 (H) 05/29/2011   CRP 2.9 (H) 03/01/2023   CRP 3.4 (H) 01/06/2023   CRP 20.27 (H) 05/29/2011   REPTSTATUS 03/06/2023 FINAL 03/01/2023   GRAMSTAIN  06/04/2011    ABUNDANT WBC PRESENT, PREDOMINANTLY PMN NO SQUAMOUS EPITHELIAL CELLS SEEN NO ORGANISMS SEEN   GRAMSTAIN  06/04/2011    ABUNDANT WBC PRESENT,  PREDOMINANTLY PMN NO SQUAMOUS EPITHELIAL CELLS SEEN NO ORGANISMS SEEN   CULT  03/01/2023    NO GROWTH 5 DAYS Performed at [facility], [address], [place], [place] [address]      Lab Results  Component Value Date   ALBUMIN 2.5 (L) 03/02/2023   ALBUMIN 2.8 (L) 03/01/2023   ALBUMIN 3.2 (L) 01/06/2023    Lab Results  Component Value Date   MG 1.8 03/02/2023   MG 2.0 03/01/2023   MG 1.9 05/26/2022   No results found for: "VD25OH"  No results found for: "PREALBUMIN"    Latest Ref Rng & Units 03/20/2023    5:33 AM 03/19/2023    6:15 AM 03/02/2023    2:42 AM  CBC EXTENDED  WBC 4.0 - 10.5 K/uL 7.8  9.7  8.0   RBC 3.87 - 5.11 MIL/uL 3.05  3.26  3.68   Hemoglobin 12.0 - 15.0 g/dL 8.7  9.2  08.6   HCT 57.8 - 46.0 % 26.8  28.9  32.5   Platelets 150 - 400 K/uL 357  422  235   NEUT# 1.7 - 7.7 K/uL   6.1   Lymph# 0.7 - 4.0 K/uL   1.4      There is no height or weight  on file to calculate BMI.  Orders:  No orders of the defined types were placed in this encounter.  No orders of the defined types were placed in this encounter.    Procedures: No procedures performed  Clinical Data: No additional findings.  ROS:  All other systems negative, except as noted in the HPI. Review of Systems  Objective: Vital Signs: There were no vitals taken for this visit.  Specialty Comments:  No specialty comments available.  PMFS History: Patient Active Problem List   Diagnosis Date Noted   Gangrene of right foot (HCC) 03/18/2023   S/P AKA (above knee amputation) unilateral, right (HCC) 03/18/2023   PVD (peripheral vascular disease) (HCC) 03/02/2023   Osteomyelitis of toe of right foot (HCC) 03/01/2023   Dehydration 03/01/2023   Acute prerenal azotemia 03/01/2023   History of COPD 03/01/2023   Urinary incontinence 03/01/2023   Anemia of chronic disease 03/01/2023   Cellulitis 01/06/2023   Pressure ulcer of sacral region, stage 2 (HCC) 01/06/2023   Intertriginous  dermatitis associated with moisture 01/06/2023   S/P TAVR (transcatheter aortic valve replacement) 05/25/2022   Nausea and vomiting 05/18/2022   Severe aortic stenosis    Depression 02/12/2017   Anxiety 02/12/2017   Hypokalemia 02/12/2017   Biliary colic    Acquired hypothyroidism    Hyperlipidemia    KNEE, ARTHRITIS, DEGEN./OSTEO 05/14/2010   Past Medical History:  Diagnosis Date   Anxiety    Arthritis    CAD (coronary artery disease)    Mild at cardiac catheterization 2018   Chronic bronchitis    Constipation    Depression    Dysrhythmia    afib 03/04/23   Hyperlipidemia    Hypokalemia 02/12/2017   Hypothyroidism    S/P TAVR (transcatheter aortic valve replacement) 05/25/2022   26mm S3UR via TF approach with Dr. Clifton James and Dr. Laneta Simmers   Severe aortic stenosis    Wound of left leg 01/07/2023    Family History  Problem Relation Age of Onset   Colon cancer Mother    Blindness Sister    Anesthesia problems Neg Hx    Hypotension Neg Hx    Malignant hyperthermia Neg Hx    Pseudochol deficiency Neg Hx     Past Surgical History:  Procedure Laterality Date   AMPUTATION Right 03/18/2023   Procedure: RIGHT ABOVE KNEE AMPUTATION;  Surgeon: [name], MD;  Location: [facility] OR;  Service: Orthopedics;  Laterality: Right;   APPLICATION OF WOUND VAC Right 03/18/2023   Procedure: APPLICATION OF WOUND VAC;  Surgeon: [name], MD;  Location: [facility] OR;  Service: Orthopedics;  Laterality: Right;   BUNIONECTOMY Left 1975   CARDIAC CATHETERIZATION  07/04/2020   CHOLECYSTECTOMY N/A 02/15/2017   Procedure: LAPAROSCOPIC CHOLECYSTECTOMY;  Surgeon: Axel Filler, MD;  Location: [facility] OR;  Service: General;  Laterality: N/A;   CORONARY ANGIOGRAPHY N/A 02/14/2017   Procedure: Coronary Angiography;  Surgeon: Yvonne Kendall, MD;  Location: [facility] INVASIVE CV LAB;  Service: Cardiovascular;  Laterality: N/A;   EYE SURGERY  2009   bilateral   HEMATOMA EVACUATION  06/04/2011   Procedure:  EVACUATION HEMATOMA;  Surgeon: Fuller Canada, MD;  Location: [facility] ORS;  Service: Orthopedics;  Laterality: Right;  evacuation of seroma   INTRAOPERATIVE TRANSTHORACIC ECHOCARDIOGRAM N/A 05/25/2022   Procedure: INTRAOPERATIVE TRANSTHORACIC ECHOCARDIOGRAM;  Surgeon: Kathleene Hazel, MD;  Location: [facility] INV[facility] LAB;  Service: Open Heart Surgery;  Laterality: N/A;   JOINT REPLACEMENT  05/2010   left total knee Dr. Romeo Apple  PATELLAR TENDON REPAIR  06/04/2011   Procedure: PATELLA TENDON REPAIR;  Surgeon: Fuller Canada, MD;  Location: [facility] ORS;  Service: Orthopedics;  Laterality: Right;   PATELLAR TENDON REPAIR  08/30/2011   Procedure: PATELLA TENDON REPAIR;  Surgeon: Fuller Canada, MD;  Location: [facility];  Service: Orthopedics;  Laterality: Right;  Allograft Reconstrucation Right Patella Tendon   RIGHT/LEFT HEART CATH AND CORONARY ANGIOGRAPHY N/A 07/04/2020   Procedure: RIGHT/LEFT HEART CATH AND CORONARY ANGIOGRAPHY;  Surgeon: Kathleene Hazel, MD;  Location: [facility];  Service: Cardiovascular;  Laterality: N/A;   TOTAL HIP ARTHROPLASTY Left 1998   TOTAL KNEE ARTHROPLASTY  05/03/2011   Procedure: TOTAL KNEE ARTHROPLASTY;  Surgeon: Fuller Canada, MD;  Location: [facility];  Service: Orthopedics;  Laterality: Right;  With DePuy   TOTAL THYROIDECTOMY  11/2009   Dr. Suszanne Conners @ [facility]   TRANSCATHETER AORTIC VALVE REPLACEMENT, TRANSFEMORAL Right 05/25/2022   Procedure: Transcatheter Aortic Valve Replacement, Transfemoral;  Surgeon: Kathleene Hazel, MD;  Location: [facility] INVASIVE CV LAB;  Service: Open Heart Surgery;  Laterality: Right;   TUBAL LIGATION     Social History   Occupational History   Occupation: Retired-Office work  Tobacco Use   Smoking status: Former    Current packs/day: 0.00    Types: Cigarettes    Quit date: 09/27/1994    Years since quitting: 28.6   Smokeless tobacco: Never  Vaping Use   Vaping status: Never Used  Substance and Sexual Activity    Alcohol use: No   Drug use: No   Sexual activity: Not Currently

## 2023-05-13 DIAGNOSIS — M62522 Muscle wasting and atrophy, not elsewhere classified, left upper arm: Secondary | ICD-10-CM | POA: Diagnosis not present

## 2023-05-13 DIAGNOSIS — R2689 Other abnormalities of gait and mobility: Secondary | ICD-10-CM | POA: Diagnosis not present

## 2023-05-13 DIAGNOSIS — M62562 Muscle wasting and atrophy, not elsewhere classified, left lower leg: Secondary | ICD-10-CM | POA: Diagnosis not present

## 2023-05-16 DIAGNOSIS — M62562 Muscle wasting and atrophy, not elsewhere classified, left lower leg: Secondary | ICD-10-CM | POA: Diagnosis not present

## 2023-05-16 DIAGNOSIS — M62522 Muscle wasting and atrophy, not elsewhere classified, left upper arm: Secondary | ICD-10-CM | POA: Diagnosis not present

## 2023-05-16 DIAGNOSIS — R2689 Other abnormalities of gait and mobility: Secondary | ICD-10-CM | POA: Diagnosis not present

## 2023-05-18 ENCOUNTER — Other Ambulatory Visit (HOSPITAL_COMMUNITY): Payer: Medicare Other

## 2023-05-18 DIAGNOSIS — S88011A Complete traumatic amputation at knee level, right lower leg, initial encounter: Secondary | ICD-10-CM | POA: Diagnosis not present

## 2023-05-18 DIAGNOSIS — I7389 Other specified peripheral vascular diseases: Secondary | ICD-10-CM | POA: Diagnosis not present

## 2023-05-18 DIAGNOSIS — E038 Other specified hypothyroidism: Secondary | ICD-10-CM | POA: Diagnosis not present

## 2023-05-18 DIAGNOSIS — J449 Chronic obstructive pulmonary disease, unspecified: Secondary | ICD-10-CM | POA: Diagnosis not present

## 2023-05-19 ENCOUNTER — Encounter: Payer: Self-pay | Admitting: Orthopedic Surgery

## 2023-05-19 ENCOUNTER — Other Ambulatory Visit (HOSPITAL_COMMUNITY): Payer: Medicare Other

## 2023-05-19 ENCOUNTER — Ambulatory Visit (INDEPENDENT_AMBULATORY_CARE_PROVIDER_SITE_OTHER): Payer: 59 | Admitting: Orthopedic Surgery

## 2023-05-19 DIAGNOSIS — L97521 Non-pressure chronic ulcer of other part of left foot limited to breakdown of skin: Secondary | ICD-10-CM

## 2023-05-19 DIAGNOSIS — Z89611 Acquired absence of right leg above knee: Secondary | ICD-10-CM

## 2023-05-19 NOTE — Progress Notes (Signed)
Office Visit Note   Patient: [name]           Date of Birth: [birth year]           MRN: [id number] Visit Date: 05/19/2023              Requested by: Assunta Found, MD [address] [place],  [place] [address] PCP: Assunta Found, MD  Chief Complaint  Patient presents with   Right Leg - Routine Post Op    03/18/2023 right AKA      HPI: Patient is an 89 year old woman who is 6 weeks status post right above-knee amputation and was in a PRAFO on the left for decubitus left heel ulcer.  She also has venous insufficiency as well as arterial insufficiency on the left.  Patient has pain with elevation of the left leg.  Assessment & Plan: Visit Diagnoses:  1. S/P AKA (above knee amputation) unilateral, right (HCC)   2. Non-pressure chronic ulcer of other part of left foot limited to breakdown of skin (HCC)     Plan: Continue dry dressing to the left heel continue to [facility] on the left try to elevate the foot a little and continue with dry dressing for the right AKA.  Patient has an upcoming arterial study at [facility].  Follow-Up Instructions: Return in about 4 weeks (around 06/16/2023).   Ortho Exam  Patient is alert, oriented, no adenopathy, well-dressed, normal affect, normal respiratory effort. Examination the wound on the right AKA is healing well 100% granulation tissue 2 cm in diameter 1 cm deep no exposed bone or tendon no cellulitis.  Examination of the left foot there is venous stasis swelling there are ischemic changes to the toes but no open ulcers in the forefoot.  The decubitus heel ulcer is healing well.  She has a callus on the great toe that was pared without complication.  Imaging: No results found. No images are attached to the encounter.  Labs: Lab Results  Component Value Date   HGBA1C 5.1 01/07/2023   ESRSEDRATE 33 (H) 03/01/2023   ESRSEDRATE 44 (H) 01/06/2023   ESRSEDRATE 44 (H) 05/29/2011   CRP 2.9 (H) 03/01/2023   CRP 3.4 (H) 01/06/2023   CRP  20.27 (H) 05/29/2011   REPTSTATUS 03/06/2023 FINAL 03/01/2023   GRAMSTAIN  06/04/2011    ABUNDANT WBC PRESENT, PREDOMINANTLY PMN NO SQUAMOUS EPITHELIAL CELLS SEEN NO ORGANISMS SEEN   GRAMSTAIN  06/04/2011    ABUNDANT WBC PRESENT, PREDOMINANTLY PMN NO SQUAMOUS EPITHELIAL CELLS SEEN NO ORGANISMS SEEN   CULT  03/01/2023    NO GROWTH 5 DAYS Performed at [facility], [address] [place], [place] [address]      Lab Results  Component Value Date   ALBUMIN 2.5 (L) 03/02/2023   ALBUMIN 2.8 (L) 03/01/2023   ALBUMIN 3.2 (L) 01/06/2023    Lab Results  Component Value Date   MG 1.8 03/02/2023   MG 2.0 03/01/2023   MG 1.9 05/26/2022   No results found for: "VD25OH"  No results found for: "PREALBUMIN"    Latest Ref Rng & Units 03/20/2023    5:33 AM 03/19/2023    6:15 AM 03/02/2023    2:42 AM  CBC EXTENDED  WBC 4.0 - 10.5 K/uL 7.8  9.7  8.0   RBC 3.87 - 5.11 MIL/uL 3.05  3.26  3.68   Hemoglobin 12.0 - 15.0 g/dL 8.7  9.2  82.9   HCT 56.2 - 46.0 % 26.8  28.9  32.5   Platelets 150 - 400 K/uL 357  422  235   NEUT# 1.7 - 7.7 K/uL   6.1   Lymph# 0.7 - 4.0 K/uL   1.4      There is no height or weight on file to calculate BMI.  Orders:  No orders of the defined types were placed in this encounter.  No orders of the defined types were placed in this encounter.    Procedures: No procedures performed  Clinical Data: No additional findings.  ROS:  All other systems negative, except as noted in the HPI. Review of Systems  Objective: Vital Signs: There were no vitals taken for this visit.  Specialty Comments:  No specialty comments available.  PMFS History: Patient Active Problem List   Diagnosis Date Noted   Gangrene of right foot (HCC) 03/18/2023   S/P AKA (above knee amputation) unilateral, right (HCC) 03/18/2023   PVD (peripheral vascular disease) (HCC) 03/02/2023   Osteomyelitis of toe of right foot (HCC) 03/01/2023   Dehydration 03/01/2023   Acute  prerenal azotemia 03/01/2023   History of COPD 03/01/2023   Urinary incontinence 03/01/2023   Anemia of chronic disease 03/01/2023   Cellulitis 01/06/2023   Pressure ulcer of sacral region, stage 2 (HCC) 01/06/2023   Intertriginous dermatitis associated with moisture 01/06/2023   S/P TAVR (transcatheter aortic valve replacement) 05/25/2022   Nausea and vomiting 05/18/2022   Severe aortic stenosis    Depression 02/12/2017   Anxiety 02/12/2017   Hypokalemia 02/12/2017   Biliary colic    Acquired hypothyroidism    Hyperlipidemia    KNEE, ARTHRITIS, DEGEN./OSTEO 05/14/2010   Past Medical History:  Diagnosis Date   Anxiety    Arthritis    CAD (coronary artery disease)    Mild at cardiac catheterization 2018   Chronic bronchitis    Constipation    Depression    Dysrhythmia    afib 03/04/23   Hyperlipidemia    Hypokalemia 02/12/2017   Hypothyroidism    S/P TAVR (transcatheter aortic valve replacement) 05/25/2022   26mm S3UR via TF approach with Dr. Clifton James and Dr. Laneta Simmers   Severe aortic stenosis    Wound of left leg 01/07/2023    Family History  Problem Relation Age of Onset   Colon cancer Mother    Blindness Sister    Anesthesia problems Neg Hx    Hypotension Neg Hx    Malignant hyperthermia Neg Hx    Pseudochol deficiency Neg Hx     Past Surgical History:  Procedure Laterality Date   AMPUTATION Right 03/18/2023   Procedure: RIGHT ABOVE KNEE AMPUTATION;  Surgeon: [name], MD;  Location: [facility] OR;  Service: Orthopedics;  Laterality: Right;   APPLICATION OF WOUND VAC Right 03/18/2023   Procedure: APPLICATION OF WOUND VAC;  Surgeon: [name], MD;  Location: [facility] OR;  Service: Orthopedics;  Laterality: Right;   BUNIONECTOMY Left 1975   CARDIAC CATHETERIZATION  07/04/2020   CHOLECYSTECTOMY N/A 02/15/2017   Procedure: LAPAROSCOPIC CHOLECYSTECTOMY;  Surgeon: Axel Filler, MD;  Location: [facility] OR;  Service: General;  Laterality: N/A;   CORONARY ANGIOGRAPHY N/A  02/14/2017   Procedure: Coronary Angiography;  Surgeon: Yvonne Kendall, MD;  Location: [facility] INVAS[facility] CV LAB;  Service: Cardiovascular;  Laterality: N/A;   EYE SURGERY  2009   bilateral   HEMATOMA EVACUATION  06/04/2011   Procedure: EVACUATION HEMATOMA;  Surgeon: Fuller Canada, MD;  Location: [facility];  Service: Orthopedics;  Laterality: Right;  evacuation of seroma  INTRAOPERATIVE TRANSTHORACIC ECHOCARDIOGRAM N/A 05/25/2022   Procedure: INTRAOPERATIVE TRANSTHORACIC ECHOCARDIOGRAM;  Surgeon: Kathleene Hazel, MD;  Location: [facility] LAB;  Service: Open Heart Surgery;  Laterality: N/A;   JOINT REPLACEMENT  05/2010   left total knee Dr. Romeo Apple   PATELLAR TENDON REPAIR  06/04/2011   Procedure: PATELLA TENDON REPAIR;  Surgeon: Fuller Canada, MD;  Location: [facility];  Service: Orthopedics;  Laterality: Right;   PATELLAR TENDON REPAIR  08/30/2011   Procedure: PATELLA TENDON REPAIR;  Surgeon: Fuller Canada, MD;  Location: [facility];  Service: Orthopedics;  Laterality: Right;  Allograft Reconstrucation Right Patella Tendon   RIGHT/LEFT HEART CATH AND CORONARY ANGIOGRAPHY N/A 07/04/2020   Procedure: RIGHT/LEFT HEART CATH AND CORONARY ANGIOGRAPHY;  Surgeon: Kathleene Hazel, MD;  Location: [facility] LAB;  Service: Cardiovascular;  Laterality: N/A;   TOTAL HIP ARTHROPLASTY Left 1998   TOTAL KNEE ARTHROPLASTY  05/03/2011   Procedure: TOTAL KNEE ARTHROPLASTY;  Surgeon: Fuller Canada, MD;  Location: [facility];  Service: Orthopedics;  Laterality: Right;  With DePuy   TOTAL THYROIDECTOMY  11/2009   Dr. Suszanne Conners @ [facility]   TRANSCATHETER AORTIC VALVE REPLACEMENT, TRANSFEMORAL Right 05/25/2022   Procedure: Transcatheter Aortic Valve Replacement, Transfemoral;  Surgeon: Kathleene Hazel, MD;  Location: [facility] INVASIVE CV LAB;  Service: Open Heart Surgery;  Laterality: Right;   TUBAL LIGATION     Social History   Occupational History   Occupation: Retired-Office work  Tobacco Use    Smoking status: Former    Current packs/day: 0.00    Types: Cigarettes    Quit date: 09/27/1994    Years since quitting: 28.6   Smokeless tobacco: Never  Vaping Use   Vaping status: Never Used  Substance and Sexual Activity   Alcohol use: No   Drug use: No   Sexual activity: Not Currently

## 2023-05-24 NOTE — Progress Notes (Unsigned)
[facility]   MULTIDISCIPLINARY HEART VALVE CLINIC                                     Cardiology Office Note:    Date:  05/26/2023   ID:  [name], DOB Feb 08, 1933, MRN [id number]  PCP:  Assunta Found, MD  [facility] Cardiologist:  Nona Dell, MD  [facility] Electrophysiologist:  None   Referring MD: Assunta Found, MD   1 year s/p TAVR  History of Present Illness:    [name] is a 89 y.o. female with a hx of arthritis, CAD, chronic bronchitis, anxiety/depression, HLD, hypothyroidism, wheelchair bound, recent AKA (02/2023), PAF and severe aortic stenosis s/p TAVR (05/25/22) who presents to clinic for follow up.   L/RHC in 06/2020 showed mild non-obstructive CAD. She developed critical AS associated with chest pain and underwent TAVR with a 26 mm Edwards Sapien 3 Ultra Resilia THV via the TF approach on 05/25/22. She was discharged on home aspirin. Given new LBBB, she was discharged with a Zio AT which was unremarkable. 1 month echo showed EF 65%, normally functioning TAVR with a mean gradient of 10 mm hg and no PVL as well as moderate MAC.    Hospitalized twice with right LE cellulitis and osteomyelitis, ultimately underwent right AKA in 02/2023.     Today the patient presents to clinic for follow up. Here with her daughter. Still living in a rehab facility but wants to go home. Her dog died when she was admitted and she wants closure. Daughter doesn't think she can take care of herself. No CP or SOB. Has chronic left LE edema, but no orthopnea or PND. No dizziness or syncope. No blood in stool or urine. No palpitations.    Past Medical History:  Diagnosis Date   Anxiety    Arthritis    CAD (coronary artery disease)    Mild at cardiac catheterization 2018   Chronic bronchitis    Constipation    Depression    Dysrhythmia    afib 03/04/23   Hyperlipidemia    Hypokalemia 02/12/2017   Hypothyroidism    S/P TAVR (transcatheter aortic valve  replacement) 05/25/2022   26mm S3UR via TF approach with Dr. Clifton James and Dr. Laneta Simmers   Severe aortic stenosis    Wound of left leg 01/07/2023    Past Surgical History:  Procedure Laterality Date   AMPUTATION Right 03/18/2023   Procedure: RIGHT ABOVE KNEE AMPUTATION;  Surgeon: [name], MD;  Location: [facility] OR;  Service: Orthopedics;  Laterality: Right;   APPLICATION OF WOUND VAC Right 03/18/2023   Procedure: APPLICATION OF WOUND VAC;  Surgeon: [name], MD;  Location: [facility] OR;  Service: Orthopedics;  Laterality: Right;   BUNIONECTOMY Left 1975   CARDIAC CATHETERIZATION  07/04/2020   CHOLECYSTECTOMY N/A 02/15/2017   Procedure: LAPAROSCOPIC CHOLECYSTECTOMY;  Surgeon: Axel Filler, MD;  Location: [facility] OR;  Service: General;  Laterality: N/A;   CORONARY ANGIOGRAPHY N/A 02/14/2017   Procedure: Coronary Angiography;  Surgeon: Yvonne Kendall, MD;  Location: [facility] INVASIVE CV LAB;  Service: Cardiovascular;  Laterality: N/A;   EYE SURGERY  2009   bilateral   HEMATOMA EVACUATION  06/04/2011   Procedure: EVACUATION HEMATOMA;  Surgeon: Fuller Canada, MD;  Location: [facility] ORS;  Service: Orthopedics;  Laterality: Right;  evacuation of seroma   INTRAOPERATIVE TRANSTHORACIC ECHOCARDIOGRAM N/A 05/25/2022   Procedure: INTRAOPERATIVE  TRANSTHORACIC ECHOCARDIOGRAM;  Surgeon: Kathleene Hazel, MD;  Location: [facility] INVASIVE CV LAB;  Service: Open Heart Surgery;  Laterality: N/A;   JOINT REPLACEMENT  05/2010   left total knee Dr. Romeo Apple   PATELLAR TENDON REPAIR  06/04/2011   Procedure: PATELLA TENDON REPAIR;  Surgeon: Fuller Canada, MD;  Location: [facility] ORS;  Service: Orthopedics;  Laterality: Right;   PATELLAR TENDON REPAIR  08/30/2011   Procedure: PATELLA TENDON REPAIR;  Surgeon: Fuller Canada, MD;  Location: [facility] ORS;  Service: Orthopedics;  Laterality: Right;  Allograft Reconstrucation Right Patella Tendon   RIGHT/LEFT HEART CATH AND CORONARY ANGIOGRAPHY N/A 07/04/2020   Procedure: RIGHT/LEFT  HEART CATH AND CORONARY ANGIOGRAPHY;  Surgeon: Kathleene Hazel, MD;  Location: [facility] INVASIVE CV LAB;  Service: Cardiovascular;  Laterality: N/A;   TOTAL HIP ARTHROPLASTY Left 1998   TOTAL KNEE ARTHROPLASTY  05/03/2011   Procedure: TOTAL KNEE ARTHROPLASTY;  Surgeon: Fuller Canada, MD;  Location: [facility] ORS;  Service: Orthopedics;  Laterality: Right;  With DePuy   TOTAL THYROIDECTOMY  11/2009   Dr. Suszanne Conners @ [facility]   TRANSCATHETER AORTIC VALVE REPLACEMENT, TRANSFEMORAL Right 05/25/2022   Procedure: Transcatheter Aortic Valve Replacement, Transfemoral;  Surgeon: Kathleene Hazel, MD;  Location: [facility] INVASIVE CV LAB;  Service: Open Heart Surgery;  Laterality: Right;   TUBAL LIGATION      Current Medications: Current Meds  Medication Sig   acetaminophen (TYLENOL) 325 MG tablet Take 2 tablets (650 mg total) by mouth every 6 (six) hours as needed for mild pain (or Fever >/= 101).   aspirin EC 81 MG tablet Take 81 mg by mouth daily. Swallow whole.   levothyroxine (SYNTHROID) 150 MCG tablet Take 150 mcg by mouth daily before breakfast.   nitroGLYCERIN (NITROSTAT) 0.4 MG SL tablet Place 1 tablet (0.4 mg total) under the tongue every 5 (five) minutes as needed for chest pain.   oxybutynin (DITROPAN XL) 15 MG 24 hr tablet Take 15 mg by mouth daily.   PARoxetine (PAXIL) 20 MG tablet Take 20 mg by mouth daily.   [DISCONTINUED] ibuprofen (ADVIL) 200 MG tablet Take 600 mg by mouth every 6 (six) hours as needed for fever, mild pain or moderate pain.     Allergies:   Patient has no known allergies.   Social History   Socioeconomic History   Marital status: Widowed    Spouse name: Not on file   Number of children: 2   Years of education: Not on file   Highest education level: Not on file  Occupational History   Occupation: Retired-Office work  Tobacco Use   Smoking status: Former    Current packs/day: 0.00    Types: Cigarettes    Quit date: 09/27/1994    Years since quitting: 28.6   Smokeless  tobacco: Never  Vaping Use   Vaping status: Never Used  Substance and Sexual Activity   Alcohol use: No   Drug use: No   Sexual activity: Not Currently  Other Topics Concern   Not on file  Social History Narrative   ** Merged History Encounter **       Social Determinants of Health   Financial Resource Strain: Low Risk  (05/02/2023)   Overall Financial Resource Strain (CARDIA)    Difficulty of Paying Living Expenses: Not very hard  Food Insecurity: No Food Insecurity (03/02/2023)   Hunger Vital Sign    Worried About Running Out of Food in the Last Year: Never true    Ran Out of Food in  the Last Year: Never true  Transportation Needs: No Transportation Needs (05/02/2023)   PRAPARE - Administrator, Civil Service (Medical): No    Lack of Transportation (Non-Medical): No  Physical Activity: Not on file  Stress: Not on file  Social Connections: Not on file     Family History: [name] family history includes Blindness in her sister; Colon cancer in her mother. There is no history of Anesthesia problems, Hypotension, Malignant hyperthermia, or Pseudochol deficiency.  ROS:   Please see the history of present illness.    All other systems reviewed and are negative.  EKGs/Labs/Other Studies Reviewed:    Cardiac Studies & Procedures   CARDIAC CATHETERIZATION  CARDIAC CATHETERIZATION 07/04/2020  Narrative  Mid LAD lesion is 25% stenosed.  1. Mild non-obstructive CAD 2. Severe aortic stenosis (mean gradient 47.6 mmHg, peak to peak gradient 43 mmHg, AVA 0.89 cm2).  Recommendations: Continue planning for TAVR  Findings Coronary Findings Diagnostic  Dominance: Left  Left Main Vessel is large. Vessel is angiographically normal.  Left Anterior Descending Mid LAD lesion is 25% stenosed.  First Diagonal Branch Vessel is small in size.  Second Diagonal Branch Vessel is moderate in size.  Left Circumflex Vessel is large. Vessel is angiographically  normal.  First Obtuse Marginal Branch Vessel is large in size. The vessel is tortuous.  Second Obtuse Marginal Branch Vessel is moderate in size. The vessel is tortuous.  Third Obtuse Marginal Branch Vessel is small in size.  Fourth Obtuse Marginal Branch Vessel is moderate in size.  Left Posterior Descending Artery Vessel is moderate in size.  Right Coronary Artery Vessel is moderate in size. Vessel is angiographically normal.  Intervention  No interventions have been documented.   CARDIAC CATHETERIZATION  CARDIAC CATHETERIZATION 02/14/2017  Narrative Conclusion: 1. Mild, nonobstructive coronary artery disease with very large, tortuous coronary arteries. No culprit lesion identified for the patient's elevated troponin. 2. Calcified aortic valve. Aortic valve was not crossed due to significant tortuosity of the right brachiocephalic and subclavian arteries as well as known aortic stenosis.  Recommendations: 1. Medical therapy for troponin elevation, which likely represents supply-demand mismatch in the setting of acute illness. 2. If further hemodynamic assessment of the aortic valve is necessary, consider left and right heart catheterization from femoral artery approach in the future.  Yvonne Kendall, MD [facility] Pager: [phone]  Findings Coronary Findings Diagnostic  Dominance: Left  Left Main Vessel is large. Vessel is angiographically normal.  Left Anterior Descending  First Diagonal Branch Vessel is small in size.  Second Diagonal Branch Vessel is moderate in size.  Left Circumflex Vessel is large. Vessel is angiographically normal.  First Obtuse Marginal Branch Vessel is large in size. The vessel is tortuous.  Second Obtuse Marginal Branch Vessel is moderate in size. The vessel is tortuous.  Third Obtuse Marginal Branch Vessel is small in size.  Fourth Obtuse Marginal Branch Vessel is moderate in size.  Left Posterior  Descending Artery Vessel is moderate in size.  Right Coronary Artery Vessel is moderate in size. Vessel is angiographically normal.  Intervention  No interventions have been documented.   STRESS TESTS  NM MYOCAR MULTI W/SPECT W 08/31/2016  Narrative  No diagnostic ST segment changes to indicate ischemia. No arrhythmias.  Small, mild intensity, fixed mid to basal inferolateral defect suggestive of soft tissue attenuation rather than scar in light of normal wall motion in this area. No large ischemic territories are noted.  This is a low risk study.  Nuclear stress EF: 76%.   ECHOCARDIOGRAM  ECHOCARDIOGRAM COMPLETE 05/25/2023  Narrative ECHOCARDIOGRAM REPORT    Patient Name:   KIZIAH PIN Date of Exam: 05/25/2023 Medical Rec #:  [id number]           Height:       69.0 in Accession #:    [id number]          Weight:       181.0 lb Date of Birth:  02/25/1933           BSA:          1.980 m Patient Age:    89 years            BP:           139/87 mmHg Patient Gender: F                   HR:           82 bpm. Exam Location:  [address]  Procedure: 2D Echo, Cardiac Doppler and Color Doppler  Indications:    Z95.2 s/p TAVR  History:        Patient has prior history of Echocardiogram examinations, most recent 06/30/2022. CAD, Arrythmias:LBBB, Signs/Symptoms:Chest Pain; Risk Factors:Hypertension and HLD.  Sonographer:    Clearence Ped RCS Referring Phys: [id number] Raeley Gilmore R Amaris Delafuente  IMPRESSIONS   1. Left ventricular ejection fraction, by estimation, is 60 to 65%. The left ventricle has normal function. The left ventricle has no regional wall motion abnormalities. There is mild concentric left ventricular hypertrophy. Left ventricular diastolic parameters are consistent with Grade I diastolic dysfunction (impaired relaxation). 2. Right ventricular systolic function is normal. The right ventricular size is normal. There is normal pulmonary artery systolic  pressure. 3. Left atrial size was severely dilated. 4. Right atrial size was mildly dilated. 5. The mitral valve is degenerative. No evidence of mitral valve regurgitation. No evidence of mitral stenosis. Moderate mitral annular calcification. 6. The aortic valve has been repaired/replaced. Aortic valve regurgitation is not visualized. No aortic stenosis is present. Echo findings are consistent with normal structure and function of the aortic valve prosthesis. Aortic valve area, by VTI measures 1.54 cm. Aortic valve mean gradient measures 12.0 mmHg. Aortic valve Vmax measures 2.20 m/s. 7. The inferior vena cava is normal in size with greater than 50% respiratory variability, suggesting right atrial pressure of 3 mmHg.  FINDINGS Left Ventricle: Left ventricular ejection fraction, by estimation, is 60 to 65%. The left ventricle has normal function. The left ventricle has no regional wall motion abnormalities. The left ventricular internal cavity size was normal in size. There is mild concentric left ventricular hypertrophy. Left ventricular diastolic parameters are consistent with Grade I diastolic dysfunction (impaired relaxation).  Right Ventricle: The right ventricular size is normal. No increase in right ventricular wall thickness. Right ventricular systolic function is normal. There is normal pulmonary artery systolic pressure. The tricuspid regurgitant velocity is 1.73 m/s, and with an assumed right atrial pressure of 3 mmHg, the estimated right ventricular systolic pressure is 15.0 mmHg.  Left Atrium: Left atrial size was severely dilated.  Right Atrium: Right atrial size was mildly dilated.  Pericardium: There is no evidence of pericardial effusion.  Mitral Valve: The mitral valve is degenerative in appearance. Moderate mitral annular calcification. No evidence of mitral valve regurgitation. No evidence of mitral valve stenosis. MV peak gradient, 10.8 mmHg. The mean mitral valve gradient  is 4.0 mmHg.  Tricuspid Valve: The tricuspid valve  is normal in structure. Tricuspid valve regurgitation is trivial. No evidence of tricuspid stenosis.  Aortic Valve: The aortic valve has been repaired/replaced. Aortic valve regurgitation is not visualized. No aortic stenosis is present. Aortic valve mean gradient measures 12.0 mmHg. Aortic valve peak gradient measures 19.4 mmHg. Aortic valve area, by VTI measures 1.54 cm. Echo findings are consistent with normal structure and function of the aortic valve prosthesis.  Pulmonic Valve: The pulmonic valve was normal in structure. Pulmonic valve regurgitation is not visualized. No evidence of pulmonic stenosis.  Aorta: The aortic root is normal in size and structure.  Venous: The inferior vena cava is normal in size with greater than 50% respiratory variability, suggesting right atrial pressure of 3 mmHg.  IAS/Shunts: No atrial level shunt detected by color flow Doppler.   LEFT VENTRICLE PLAX 2D LVIDd:         3.00 cm   Diastology LVIDs:         2.30 cm   LV e' medial:   4.79 cm/s LV PW:         1.20 cm   LV E/e' medial: 16.5 LV IVS:        1.40 cm LVOT diam:     1.70 cm LV SV:         72 LV SV Index:   36 LVOT Area:     2.27 cm   RIGHT VENTRICLE RV Basal diam:  3.40 cm RV S prime:     15.20 cm/s TAPSE (M-mode): 1.9 cm RVSP:           15.0 mmHg  LEFT ATRIUM             Index        RIGHT ATRIUM           Index LA diam:        3.70 cm 1.87 cm/m   RA Pressure: 3.00 mmHg LA Vol (A2C):   81.9 ml 41.36 ml/m  RA Area:     13.80 cm LA Vol (A4C):   93.5 ml 47.22 ml/m  RA Volume:   35.60 ml  17.98 ml/m LA Biplane Vol: 96.2 ml 48.58 ml/m AORTIC VALVE AV Area (Vmax):    1.67 cm AV Area (Vmean):   1.61 cm AV Area (VTI):     1.54 cm AV Vmax:           220.00 cm/s AV Vmean:          166.000 cm/s AV VTI:            0.466 m AV Peak Grad:      19.4 mmHg AV Mean Grad:      12.0 mmHg LVOT Vmax:         162.00 cm/s LVOT Vmean:         118.000 cm/s LVOT VTI:          0.317 m LVOT/AV VTI ratio: 0.68  AORTA Ao Root diam: 2.10 cm Ao Asc diam:  2.70 cm  MITRAL VALVE                TRICUSPID VALVE MV Area (PHT): 4.36 cm     TR Peak grad:   12.0 mmHg MV Area VTI:   2.21 cm     TR Vmax:        173.00 cm/s MV Peak grad:  10.8 mmHg    Estimated RAP:  3.00 mmHg MV Mean grad:  4.0 mmHg     RVSP:  15.0 mmHg MV Vmax:       1.64 m/s MV Vmean:      98.7 cm/s    SHUNTS MV Decel Time: 174 msec     Systemic VTI:  0.32 m MV E velocity: 79.10 cm/s   Systemic Diam: 1.70 cm MV A velocity: 130.00 cm/s MV E/A ratio:  0.61  Arvilla Meres MD Electronically signed by Arvilla Meres MD Signature Date/Time: 05/25/2023/3:08:17 PM    Final    MONITORS  LONG TERM MONITOR-LIVE TELEMETRY (3-14 DAYS) 07/01/2022  Narrative ZIO AT.  13 days, 22 hours analyzed.  Predominant rhythm is sinus with IVCD.  Heart rate ranged from 39 bpm up to 124 bpm and average heart rate 74 bpm. There were rare PACs including atrial couplets and triplets representing less than 1% total beats. There were rare PVCs representing less than 1% total beats.  Also limited episode of ventricular trigeminy. Multiple, brief episodes of PSVT were noted, the longest of which lasted 15 beats with average heart rate 114 bpm. There were no pauses. Patient triggered episodes and reported chest pain generally correlated with sinus rhythm.   CT SCANS  CT CORONARY MORPH W/CTA COR W/SCORE 04/15/2022  Addendum 04/15/2022  1:20 PM ADDENDUM REPORT: 04/15/2022 13:18  CLINICAL DATA:  Aortic Valve pathology with assessment for TAVR  EXAM: Cardiac TAVR CT  TECHNIQUE: The patient was scanned on a Siemens Force 192 slice scanner. A 120 kV retrospective scan was triggered in the descending thoracic aorta at 111 HU's. Gantry rotation speed was 270 msecs and collimation was .9 mm. No beta blockade or nitro were given. The 3D data set was reconstructed in 5%  intervals of the R-R cycle. Systolic and diastolic phases were analyzed on a dedicated work station using MPR, MIP and VRT modes. The patient received 100 cc of contrast.  FINDINGS: Aortic Valve: Severely thickened tri-leaflet aortic valve with heavy calcification and reduced excursion the planimeter valve area is 0.798 Sq cm consistent with severe aortic stenosis  Annular calcification: Severe based on presence of LVOT calcification  Aortic Valve Calcium Score: 5748  Presence of basal septal hypertrophy: Yes, severe concentric hypertrophy septal thickness 21 mm in short axis assessment.  There is no systolic anterior motion of the mitral valve on cine assessment.  Systolic measurements greater than diastolic measurements, systolic measurements used (see below)  Perimembranous septal diameter: 12 mm  Mitral Valve: Moderate mitral annular calcification with calcified anterior and posterior mitral valve leaflets.  Aortic Annulus Measurements- 10 %  Major annulus diameter: 28 mm  Minor annulus diameter: 25 mm  Annular perimeter: 82 mm  Annular area: 5.19 cm2  Aortic Root Measurements  Sinotubular Junction: 30 mm  Ascending Thoracic Aorta: 33 mm  Aortic Arch: 26 mm  Descending Thoracic Aorta: 28 mm  Aortic atherosclerosis.  Sinus of Valsalva Measurements:  Right coronary cusp width: 30 mm  Left coronary cusp width: 30 mm  Non coronary cusp width: 32 mm  Mean diameter: 31 mm  Coronary Artery Height above Annulus:  Left Main: 16 mm  Left SoV height: 25 mm  Right Coronary: 20 mm  Right SoV height: 25 mm  Optimum Fluoroscopic Angle for Delivery: LAO 3, CAU 1  Valves for structural team consideration:  26 mm Edward Sapien Valve  Sufficient coronary height, SoV height and diameter for 29 mm CoreValve  Sapien valve may be favored in the setting of left coronary calcium.  Non TAVR Valve Findings:  Coronary Arteries: Normal coronary origin. Small  non  dominant RCA with no coronary calcium. Study not completed with nitroglycerin. Unable to fully assess mid LAD and mid LCX.  Coronary Calcium Score:  Left main: 0  Left anterior descending artery: 437  Left circumflex artery: 234  Right coronary artery: 0  Total: 671  Interatrial septum: No interatrial communication  Pulmonary veins: Normal variant anatomy, presence of a right middle pulmonary vein  Main Pulmonary artery: Dilation of the main pulmonary artery: severe at 38 mm  Trunk to aorta ratio of 1.1.  Left atrial appendage: Patent  Extra Cardiac Findings as per separate reporting.  IMPRESSION: 1. Severe Aortic stenosis. Findings pertinent to TAVR procedure are detailed above.  2. Patient's total coronary artery calcium score is 671.  3. Compared to 2021 Cardiac CT, further pulmonary artery dilation. No change in valve sizing recommendations.  3. Dilation of the main pulmonary artery. This can be associated with the presence of pulmonary hypertension; clinical correlation advised.  RECOMMENDATIONS:  The proposed cut-off value of 1,651 AU yielded a 93 % sensitivity and 75 % specificity in grading AS severity in patients with classical low-flow, low-gradient AS. Proposed different cut-off values to define severe AS for men and women as 2,065 AU and 1,274 AU, respectively. The joint European and American recommendations for the assessment of AS consider the aortic valve calcium score as a continuum - a very high calcium score suggests severe AS and a low calcium score suggests severe AS is unlikely.  Sunday Shams, et al. 2017 ESC/EACTS Guidelines for the management of valvular heart disease. Eur Heart J [phone]  Coronary artery calcium (CAC) score is a strong predictor of incident coronary heart disease (CHD) and provides predictive information beyond traditional risk factors. CAC scoring is reasonable to use in the decision to  withhold, postpone, or initiate statin therapy in intermediate-risk or selected borderline-risk asymptomatic adults (age 47-75 years and LDL-C >=70 to <190 mg/dL) who do not have diabetes or established atherosclerotic cardiovascular disease (ASCVD).* In intermediate-risk (10-year ASCVD risk >=7.5% to <20%) adults or selected borderline-risk (10-year ASCVD risk >=5% to <7.5%) adults in whom a CAC score is measured for the purpose of making a treatment decision the following recommendations have been made:  If CAC = 0, it is reasonable to withhold statin therapy and reassess in 5 to 10 years, as long as higher risk conditions are absent (diabetes mellitus, family history of premature CHD in first degree relatives (males <55 years; females <65 years), cigarette smoking, LDL >=190 mg/dL or other independent risk factors).  If CAC is 1 to 99, it is reasonable to initiate statin therapy for patients >=28 years of age.  If CAC is >=100 or >=75th percentile, it is reasonable to initiate statin therapy at any age.  Cardiology referral should be considered for patients with CAC scores >=400 or >=75th percentile.  *2018 AHA/ACC/AACVPR/AAPA/ABC/ACPM/ADA/AGS/APhA/ASPC/NLA/PCNA Guideline on the Management of Blood Cholesterol: A Report of the American College of Cardiology/American Heart Association Task Force on Clinical Practice Guidelines. J Am Coll Cardiol. 2019;73(24):3168-3209.  Mahesh  Chandrasekhar   Electronically Signed By: Riley Lam M.D. On: 04/15/2022 13:18  Narrative EXAM: OVER-READ INTERPRETATION  CT CHEST  The following report is a limited chest CT over-read performed by radiologist Dr. Allegra Lai of [facility] on 04/15/2022. This over-read does not include interpretation of cardiac or coronary anatomy or pathology. The cardiac CTA interpretation by the cardiologist is attached.  COMPARISON:  None Available.  FINDINGS: Extracardiac  findings will be  described separately under dictation for contemporaneously obtained CTA chest, abdomen and pelvis.  IMPRESSION: Please see separate dictation for contemporaneously obtained CTA chest, abdomen and pelvis dated 04/15/2022 for full description of relevant extracardiac findings.  Electronically Signed: By: Allegra Lai M.D. On: 04/15/2022 11:13   CT SCANS  CT CORONARY MORPH W/CTA COR W/SCORE 07/09/2020  Addendum 07/09/2020  3:38 PM ADDENDUM REPORT: 07/09/2020 15:36  EXAM: OVER-READ INTERPRETATION  CT CHEST  The following report is an over-read performed by radiologist Dr. Royal Piedra [facility] on 07/09/2020. This over-read does not include interpretation of cardiac or coronary anatomy or pathology. The coronary calcium score and cardiac CTA interpretation by the cardiologist is attached.  COMPARISON:  Chest CTA 02/12/2017.  FINDINGS: Extracardiac findings will be described separately under dictation for contemporaneously obtained CTA chest, abdomen and pelvis.  IMPRESSION: Please see separate dictation for contemporaneously obtained CTA chest, abdomen and pelvis dated 07/09/2020 for full description of relevant extracardiac findings.   Electronically Signed By: Trudie Reed M.D. On: 07/09/2020 15:36  Narrative CLINICAL DATA:  Aortic Stenosis  EXAM: Cardiac TAVR CT  TECHNIQUE: The patient was scanned on a Siemens Force 192 slice scanner. A 120 kV retrospective scan was triggered in the ascending thoracic aorta at 140 HU's. Gantry rotation speed was 250 msecs and collimation was .6 mm. No beta blockade or nitro were given. The 3D data set was reconstructed in 5% intervals of the R-R cycle. Systolic and diastolic phases were analyzed on a dedicated work station using MPR, MIP and VRT modes. The patient received 80 cc of contrast.  FINDINGS: Aortic Valve: Tri leaflet heavily calcified with restricted leaflet motion  Heaviest calcification is on the non coronary cusp AV calcium score 5000  Aorta: No aneurysm with normal arch vessels and moderate calcific atherosclerosis  Sino-tubular Junction: 28 mm  Ascending Thoracic Aorta: 31 mm  Aortic Arch: 24 mm  Descending Thoracic Aorta: 21 mm  Sinus of Valsalva Measurements:  Non-coronary: 29.9 mm  Right - coronary: 30.8 mm  Left -   coronary: 29.7 mm  Coronary Artery Height above Annulus:  Left Main: 14 mm above annulus  Right Coronary: 16.7 mm above annulus  Virtual Basal Annulus Measurements:  Maximum / Minimum Diameter: 26.6 mm x 24.1 mm  Perimeter: 81.5 mm  Area: 506 mm2  Coronary Arteries: Sufficient height above annulus for deployment  Optimum Fluoroscopic Angle for Delivery: LAO 12 Cranial 5 degrees  IMPRESSION: 1. Trileaflet AV with annular area of 506 mm 2 suitable for a 26 mm Sapien 3 valve  2.  Coronary arteries sufficient height above annulus for deployment  3. Optimum angiographic angle for deployment LAO 12 Cranial 5 degrees  4.  Normal aortic root 3.1 cm  5.  Dilated MPA 3.0 cm suggesting elevated PA pressures  6.  Moderate posterior mitral annular calcification  7.  Heavily calcified AV with score of 5000  Charlton Haws  Electronically Signed: By: Charlton Haws M.D. On: 07/09/2020 12:33          EKG:  EKG is NOT ordered today.   Recent Labs: 01/06/2023: B Natriuretic Peptide 170.6 03/02/2023: ALT 17; Magnesium 1.8 03/20/2023: BUN 31; Creatinine, Ser 1.04; Hemoglobin 8.7; Platelets 357; Potassium 3.7; Sodium 135  Recent Lipid Panel No results found for: "CHOL", "TRIG", "HDL", "CHOLHDL", "VLDL", "LDLCALC", "LDLDIRECT"   Risk Assessment/Calculations:            Physical Exam:    VS:  BP (!) 126/50   Pulse 64  Ht 5\' 9"  (1.753 m)   Wt 154 lb 9.6 oz (70.1 kg)   SpO2 96%   BMI 22.83 kg/m     Wt Readings from Last 3 Encounters:  05/25/23 154 lb 9.6 oz (70.1 kg)  03/18/23 181 lb (82.1 kg)   03/07/23 196 lb 6.9 oz (89.1 kg)     GEN: chronically ill appearing in wheelchair HEENT: Normal NECK: No JVD LYMPHATICS: No lymphadenopathy CARDIAC: RRR, no murmurs, rubs, gallops RESPIRATORY:  Clear to auscultation without rales, wheezing or rhonchi  ABDOMEN: Soft, non-tender, non-distended MUSCULOSKELETAL: s/p R BKA, left leg in boot with 1+ edema.  SKIN: Warm and dry NEUROLOGIC:  Alert and oriented x 3 PSYCHIATRIC:  Normal affect   ASSESSMENT:    1. S/P TAVR (transcatheter aortic valve replacement)   2. LBBB (left bundle branch block)   3. Lower extremity edema   4. Essential hypertension   5. S/P AKA (above knee amputation) unilateral, right (HCC)   6. PAF (paroxysmal atrial fibrillation) (HCC)    PLAN:    In order of problems listed above:  Severe AS s/p TAVR: echo today shows EF 60%, normally functioning TAVR with a mean gradient of 12 mm hg and no PVL. She has NYHA class II symptoms. Started back on a baby aspirin 81 mg daily (was not taking). SBE prophylaxis discussed; the patient is edentulous and does not go to the dentist. Continue follow up with Dr. Diona Browner    LBBB: new post TAVR. Zio with no HAVB.   HTN: BP well controlled today. Continue on Lasix 40mg  daily.   R AKA: healing well.   PAF: pt wondered if she had an abnormal heart beat. She sounds regular on exam and tracing in echo today appears to be sinus. I reviewed some old ECGs and from June when she was admitted for osteomyelitis and she did appear to have afib at that time. She previously wore a 14 ambulatory monitor in 06/2022 with no afib. Given anemia and high fall risk, I think it's best to not to consider oral anticoagulation at this time.    Medication Adjustments/Labs and Tests Ordered: Current medicines are reviewed at length with the patient today.  Concerns regarding medicines are outlined above.  No orders of the defined types were placed in this encounter.  No orders of the defined types  were placed in this encounter.   Patient Instructions  Medication Instructions:  Start Aspirin 81 mg daily   *If you need a refill on your cardiac medications before your next appointment, please call your pharmacy*   Lab Work: None ordered   If you have labs (blood work) drawn today and your tests are completely normal, you will receive your results only by: MyChart Message (if you have MyChart) OR A paper copy in the mail If you have any lab test that is abnormal or we need to change your treatment, we will call you to review the results.   Testing/Procedures: None ordered    Follow-Up: Follow up as scheduled   Other Instructions     Signed, Cline Crock, PA-C  05/26/2023 9:26 AM    [facility]

## 2023-05-25 ENCOUNTER — Ambulatory Visit (HOSPITAL_BASED_OUTPATIENT_CLINIC_OR_DEPARTMENT_OTHER): Payer: 59

## 2023-05-25 ENCOUNTER — Ambulatory Visit: Payer: 59 | Admitting: Physician Assistant

## 2023-05-25 VITALS — BP 126/50 | HR 64 | Ht 69.0 in | Wt 154.6 lb

## 2023-05-25 DIAGNOSIS — I10 Essential (primary) hypertension: Secondary | ICD-10-CM | POA: Diagnosis not present

## 2023-05-25 DIAGNOSIS — Z89611 Acquired absence of right leg above knee: Secondary | ICD-10-CM | POA: Diagnosis not present

## 2023-05-25 DIAGNOSIS — Z952 Presence of prosthetic heart valve: Secondary | ICD-10-CM

## 2023-05-25 DIAGNOSIS — I480 Paroxysmal atrial fibrillation: Secondary | ICD-10-CM | POA: Diagnosis not present

## 2023-05-25 DIAGNOSIS — R600 Localized edema: Secondary | ICD-10-CM | POA: Insufficient documentation

## 2023-05-25 DIAGNOSIS — I447 Left bundle-branch block, unspecified: Secondary | ICD-10-CM | POA: Insufficient documentation

## 2023-05-25 LAB — ECHOCARDIOGRAM COMPLETE
AR max vel: 1.67 cm2
AV Area VTI: 1.54 cm2
AV Area mean vel: 1.61 cm2
AV Mean grad: 12 mmHg
AV Peak grad: 19.4 mmHg
Ao pk vel: 2.2 m/s
Area-P 1/2: 4.36 cm2
MV VTI: 2.21 cm2
S' Lateral: 2.3 cm

## 2023-05-25 NOTE — Patient Instructions (Signed)
Medication Instructions:  Start Aspirin 81 mg daily   *If you need a refill on your cardiac medications before your next appointment, please call your pharmacy*   Lab Work: None ordered   If you have labs (blood work) drawn today and your tests are completely normal, you will receive your results only by: MyChart Message (if you have MyChart) OR A paper copy in the mail If you have any lab test that is abnormal or we need to change your treatment, we will call you to review the results.   Testing/Procedures: None ordered    Follow-Up: Follow up as scheduled   Other Instructions

## 2023-05-26 DIAGNOSIS — N189 Chronic kidney disease, unspecified: Secondary | ICD-10-CM | POA: Diagnosis not present

## 2023-05-26 DIAGNOSIS — R6889 Other general symptoms and signs: Secondary | ICD-10-CM | POA: Diagnosis not present

## 2023-05-26 DIAGNOSIS — E785 Hyperlipidemia, unspecified: Secondary | ICD-10-CM | POA: Diagnosis not present

## 2023-05-26 DIAGNOSIS — R0789 Other chest pain: Secondary | ICD-10-CM | POA: Diagnosis not present

## 2023-05-26 DIAGNOSIS — I447 Left bundle-branch block, unspecified: Secondary | ICD-10-CM | POA: Diagnosis not present

## 2023-05-26 DIAGNOSIS — I350 Nonrheumatic aortic (valve) stenosis: Secondary | ICD-10-CM | POA: Diagnosis not present

## 2023-05-26 DIAGNOSIS — K449 Diaphragmatic hernia without obstruction or gangrene: Secondary | ICD-10-CM | POA: Diagnosis not present

## 2023-05-26 DIAGNOSIS — R0989 Other specified symptoms and signs involving the circulatory and respiratory systems: Secondary | ICD-10-CM | POA: Diagnosis not present

## 2023-05-26 DIAGNOSIS — I5032 Chronic diastolic (congestive) heart failure: Secondary | ICD-10-CM | POA: Diagnosis not present

## 2023-05-26 DIAGNOSIS — R54 Age-related physical debility: Secondary | ICD-10-CM | POA: Diagnosis not present

## 2023-05-26 DIAGNOSIS — K219 Gastro-esophageal reflux disease without esophagitis: Secondary | ICD-10-CM | POA: Diagnosis not present

## 2023-05-26 DIAGNOSIS — Z743 Need for continuous supervision: Secondary | ICD-10-CM | POA: Diagnosis not present

## 2023-05-26 DIAGNOSIS — I700 Atherosclerosis of aorta: Secondary | ICD-10-CM | POA: Diagnosis not present

## 2023-05-26 DIAGNOSIS — I2510 Atherosclerotic heart disease of native coronary artery without angina pectoris: Secondary | ICD-10-CM | POA: Diagnosis not present

## 2023-05-26 DIAGNOSIS — R112 Nausea with vomiting, unspecified: Secondary | ICD-10-CM | POA: Diagnosis not present

## 2023-05-26 DIAGNOSIS — Z66 Do not resuscitate: Secondary | ICD-10-CM | POA: Diagnosis not present

## 2023-05-26 DIAGNOSIS — Z87891 Personal history of nicotine dependence: Secondary | ICD-10-CM | POA: Diagnosis not present

## 2023-05-26 DIAGNOSIS — Z20822 Contact with and (suspected) exposure to covid-19: Secondary | ICD-10-CM | POA: Diagnosis not present

## 2023-05-26 DIAGNOSIS — J449 Chronic obstructive pulmonary disease, unspecified: Secondary | ICD-10-CM | POA: Diagnosis not present

## 2023-05-26 DIAGNOSIS — E039 Hypothyroidism, unspecified: Secondary | ICD-10-CM | POA: Diagnosis not present

## 2023-05-26 DIAGNOSIS — R0902 Hypoxemia: Secondary | ICD-10-CM | POA: Diagnosis not present

## 2023-05-26 DIAGNOSIS — R110 Nausea: Secondary | ICD-10-CM | POA: Diagnosis not present

## 2023-05-26 DIAGNOSIS — M1990 Unspecified osteoarthritis, unspecified site: Secondary | ICD-10-CM | POA: Diagnosis not present

## 2023-05-26 DIAGNOSIS — R079 Chest pain, unspecified: Secondary | ICD-10-CM | POA: Diagnosis not present

## 2023-05-27 ENCOUNTER — Ambulatory Visit: Payer: 59 | Admitting: Cardiology

## 2023-05-27 DIAGNOSIS — I5032 Chronic diastolic (congestive) heart failure: Secondary | ICD-10-CM | POA: Diagnosis not present

## 2023-05-27 DIAGNOSIS — R112 Nausea with vomiting, unspecified: Secondary | ICD-10-CM | POA: Diagnosis not present

## 2023-05-27 DIAGNOSIS — Z952 Presence of prosthetic heart valve: Secondary | ICD-10-CM | POA: Diagnosis not present

## 2023-05-27 DIAGNOSIS — I350 Nonrheumatic aortic (valve) stenosis: Secondary | ICD-10-CM | POA: Diagnosis not present

## 2023-05-27 DIAGNOSIS — E079 Disorder of thyroid, unspecified: Secondary | ICD-10-CM | POA: Diagnosis not present

## 2023-05-27 DIAGNOSIS — K449 Diaphragmatic hernia without obstruction or gangrene: Secondary | ICD-10-CM | POA: Diagnosis not present

## 2023-05-27 DIAGNOSIS — K219 Gastro-esophageal reflux disease without esophagitis: Secondary | ICD-10-CM | POA: Diagnosis not present

## 2023-05-27 DIAGNOSIS — K5900 Constipation, unspecified: Secondary | ICD-10-CM | POA: Diagnosis not present

## 2023-05-27 DIAGNOSIS — N189 Chronic kidney disease, unspecified: Secondary | ICD-10-CM | POA: Diagnosis not present

## 2023-05-27 DIAGNOSIS — Z89611 Acquired absence of right leg above knee: Secondary | ICD-10-CM | POA: Diagnosis not present

## 2023-05-27 DIAGNOSIS — E785 Hyperlipidemia, unspecified: Secondary | ICD-10-CM | POA: Diagnosis not present

## 2023-05-27 DIAGNOSIS — J449 Chronic obstructive pulmonary disease, unspecified: Secondary | ICD-10-CM | POA: Diagnosis not present

## 2023-05-28 DIAGNOSIS — R0789 Other chest pain: Secondary | ICD-10-CM | POA: Diagnosis not present

## 2023-05-28 DIAGNOSIS — R531 Weakness: Secondary | ICD-10-CM | POA: Diagnosis not present

## 2023-05-28 DIAGNOSIS — R627 Adult failure to thrive: Secondary | ICD-10-CM | POA: Diagnosis not present

## 2023-06-15 ENCOUNTER — Encounter: Payer: Self-pay | Admitting: Orthopedic Surgery

## 2023-06-16 ENCOUNTER — Encounter: Payer: Self-pay | Admitting: Orthopedic Surgery

## 2023-06-16 ENCOUNTER — Ambulatory Visit (INDEPENDENT_AMBULATORY_CARE_PROVIDER_SITE_OTHER): Payer: 59 | Admitting: Orthopedic Surgery

## 2023-06-16 DIAGNOSIS — L97521 Non-pressure chronic ulcer of other part of left foot limited to breakdown of skin: Secondary | ICD-10-CM

## 2023-06-16 DIAGNOSIS — Z89611 Acquired absence of right leg above knee: Secondary | ICD-10-CM

## 2023-06-16 NOTE — Progress Notes (Signed)
Office Visit Note   Patient: [name]           Date of Birth: 21-Aug-1933           MRN: [id number] Visit Date: 06/16/2023              Requested by: Assunta Found, MD [address] [place],  [place] [address] PCP: Assunta Found, MD  Chief Complaint  Patient presents with   Right Leg - Routine Post Op    03/18/2023 right AKA      HPI: Patient is an 89 year old woman who was almost 3 months status post right above-knee amputation.  Patient feels like the leg is healing well.  She also has a chronic ulcer on the left heel and is wearing a PRAFO.  Assessment & Plan: Visit Diagnoses:  1. S/P AKA (above knee amputation) unilateral, right (HCC)   2. Non-pressure chronic ulcer of other part of left foot limited to breakdown of skin (HCC)     Plan: Continue with the PRAFO on the left with dry dressing changes.  Continue dry dressing changes to the right above-knee amputation.  Follow-Up Instructions: Return in about 4 weeks (around 07/14/2023).   Ortho Exam  Patient is alert, oriented, no adenopathy, well-dressed, normal affect, normal respiratory effort. Examination patient has a residual area of hypergranulation tissue over the right above-knee amputation.  This is 1 cm in diameter.  This was touched with silver nitrate.  There is no redness no cellulitis no depth to the wound no exposed bone.  Examination the left heel there is improved granulation tissue over the decubitus ulcer.  There is venous stasis changes with swelling and dermatitis of the left leg but no open ulcers.  Imaging: No results found. No images are attached to the encounter.  Labs: Lab Results  Component Value Date   HGBA1C 5.1 01/07/2023   ESRSEDRATE 33 (H) 03/01/2023   ESRSEDRATE 44 (H) 01/06/2023   ESRSEDRATE 44 (H) 05/29/2011   CRP 2.9 (H) 03/01/2023   CRP 3.4 (H) 01/06/2023   CRP 20.27 (H) 05/29/2011   REPTSTATUS 03/06/2023 FINAL 03/01/2023   GRAMSTAIN  06/04/2011    ABUNDANT WBC  PRESENT, PREDOMINANTLY PMN NO SQUAMOUS EPITHELIAL CELLS SEEN NO ORGANISMS SEEN   GRAMSTAIN  06/04/2011    ABUNDANT WBC PRESENT, PREDOMINANTLY PMN NO SQUAMOUS EPITHELIAL CELLS SEEN NO ORGANISMS SEEN   CULT  03/01/2023    NO GROWTH 5 DAYS Performed at [facility], [address], [place], [place] [address]      Lab Results  Component Value Date   ALBUMIN 2.5 (L) 03/02/2023   ALBUMIN 2.8 (L) 03/01/2023   ALBUMIN 3.2 (L) 01/06/2023    Lab Results  Component Value Date   MG 1.8 03/02/2023   MG 2.0 03/01/2023   MG 1.9 05/26/2022   No results found for: "VD25OH"  No results found for: "PREALBUMIN"    Latest Ref Rng & Units 03/20/2023    5:33 AM 03/19/2023    6:15 AM 03/02/2023    2:42 AM  CBC EXTENDED  WBC 4.0 - 10.5 K/uL 7.8  9.7  8.0   RBC 3.87 - 5.11 MIL/uL 3.05  3.26  3.68   Hemoglobin 12.0 - 15.0 g/dL 8.7  9.2  24.4   HCT 01.0 - 46.0 % 26.8  28.9  32.5   Platelets 150 - 400 K/uL 357  422  235   NEUT# 1.7 - 7.7 K/uL   6.1   Lymph# 0.7 -  4.0 K/uL   1.4      There is no height or weight on file to calculate BMI.  Orders:  No orders of the defined types were placed in this encounter.  No orders of the defined types were placed in this encounter.    Procedures: No procedures performed  Clinical Data: No additional findings.  ROS:  All other systems negative, except as noted in the HPI. Review of Systems  Objective: Vital Signs: There were no vitals taken for this visit.  Specialty Comments:  No specialty comments available.  PMFS History: Patient Active Problem List   Diagnosis Date Noted   Gangrene of right foot (HCC) 03/18/2023   S/P AKA (above knee amputation) unilateral, right (HCC) 03/18/2023   PVD (peripheral vascular disease) (HCC) 03/02/2023   Osteomyelitis of toe of right foot (HCC) 03/01/2023   Dehydration 03/01/2023   Acute prerenal azotemia 03/01/2023   History of COPD 03/01/2023   Urinary incontinence 03/01/2023   Anemia of  chronic disease 03/01/2023   Cellulitis 01/06/2023   Pressure ulcer of sacral region, stage 2 (HCC) 01/06/2023   Intertriginous dermatitis associated with moisture 01/06/2023   S/P TAVR (transcatheter aortic valve replacement) 05/25/2022   Nausea and vomiting 05/18/2022   Severe aortic stenosis    Depression 02/12/2017   Anxiety 02/12/2017   Hypokalemia 02/12/2017   Biliary colic    Acquired hypothyroidism    Hyperlipidemia    KNEE, ARTHRITIS, DEGEN./OSTEO 05/14/2010   Past Medical History:  Diagnosis Date   Anxiety    Arthritis    CAD (coronary artery disease)    Mild at cardiac catheterization 2018   Chronic bronchitis    Constipation    Depression    Dysrhythmia    afib 03/04/23   Hyperlipidemia    Hypokalemia 02/12/2017   Hypothyroidism    S/P TAVR (transcatheter aortic valve replacement) 05/25/2022   26mm S3UR via TF approach with Dr. Clifton James and Dr. Laneta Simmers   Severe aortic stenosis    Wound of left leg 01/07/2023    Family History  Problem Relation Age of Onset   Colon cancer Mother    Blindness Sister    Anesthesia problems Neg Hx    Hypotension Neg Hx    Malignant hyperthermia Neg Hx    Pseudochol deficiency Neg Hx     Past Surgical History:  Procedure Laterality Date   AMPUTATION Right 03/18/2023   Procedure: RIGHT ABOVE KNEE AMPUTATION;  Surgeon: [name], MD;  Location: [facility] OR;  Service: Orthopedics;  Laterality: Right;   APPLICATION OF WOUND VAC Right 03/18/2023   Procedure: APPLICATION OF WOUND VAC;  Surgeon: [name], MD;  Location: [facility] OR;  Service: Orthopedics;  Laterality: Right;   BUNIONECTOMY Left 1975   CARDIAC CATHETERIZATION  07/04/2020   CHOLECYSTECTOMY N/A 02/15/2017   Procedure: LAPAROSCOPIC CHOLECYSTECTOMY;  Surgeon: Axel Filler, MD;  Location[facility] OR;  Service: General;  Laterality: N/A;   CORONARY ANGIOGRAPHY N/A 02/14/2017   Procedure: Coronary Angiography;  Surgeon: Yvonne Kendall, MD;  Location: [facility] INVASIVE CV LAB;   Service: Cardiovascular;  Laterality: N/A;   EYE SURGERY  2009   bilateral   HEMATOMA EVACUATION  06/04/2011   Procedure: EVACUATION HEMATOMA;  Surgeon: Fuller Canada, MD;  Location: [facility] ORS;  Service: Orthopedics;  Laterality: Right;  evacuation of seroma   INTRAOPERATIVE TRANSTHORACIC ECHOCARDIOGRAM N/A 05/25/2022   Procedure: INTRAOPERATIVE TRANSTHORACIC ECHOCARDIOGRAM;  Surgeon: Kathleene Hazel, MD;  Location: [facility] INVASIVE CV LAB;  Service: Open Heart Surgery;  Laterality: N/A;   JOINT REPLACEMENT  05/2010   left total knee Dr. Romeo Apple   PATELLAR TENDON REPAIR  06/04/2011   Procedure: PATELLA TENDON REPAIR;  Surgeon: Fuller Canada, MD;  Location: [facility] ORS;  Service: Orthopedics;  Laterality: Right;   PATELLAR TENDON REPAIR  08/30/2011   Procedure: PATELLA TENDON REPAIR;  Surgeon: Fuller Canada, MD;  Location: [facility] ORS;  Service: Orthopedics;  Laterality: Right;  Allograft Reconstrucation Right Patella Tendon   RIGHT/LEFT HEART CATH AND CORONARY ANGIOGRAPHY N/A 07/04/2020   Procedure: RIGHT/LEFT HEART CATH AND CORONARY ANGIOGRAPHY;  Surgeon: Kathleene Hazel, MD;  Location: [facility] IN[facility] CV LAB;  Service: Cardiovascular;  Laterality: N/A;   TOTAL HIP ARTHROPLASTY Left 1998   TOTAL KNEE ARTHROPLASTY  05/03/2011   Procedure: TOTAL KNEE ARTHROPLASTY;  Surgeon: Fuller Canada, MD;  Location: [facility] ORS;  Service: Orthopedics;  Laterality: Right;  With DePuy   TOTAL THYROIDECTOMY  11/2009   Dr. Suszanne Conners @ [facility]   TRANSCATHETER AORTIC VALVE REPLACEMENT, TRANSFEMORAL Right 05/25/2022   Procedure: Transcatheter Aortic Valve Replacement, Transfemoral;  Surgeon: Kathleene Hazel, MD;  Location: [facility] INVASIVE CV LAB;  Service: Open Heart Surgery;  Laterality: Right;   TUBAL LIGATION     Social History   Occupational History   Occupation: Retired-Office work  Tobacco Use   Smoking status: Former    Current packs/day: 0.00    Types: Cigarettes    Quit date: 09/27/1994    Years since  quitting: 28.7   Smokeless tobacco: Never  Vaping Use   Vaping status: Never Used  Substance and Sexual Activity   Alcohol use: No   Drug use: No   Sexual activity: Not Currently

## 2023-06-17 ENCOUNTER — Telehealth: Payer: Self-pay | Admitting: Orthopedic Surgery

## 2023-06-17 NOTE — Telephone Encounter (Signed)
SW dtr, [name], she will come by Monday morning to pick up shoe and sign the tablet.

## 2023-06-17 NOTE — Telephone Encounter (Signed)
Patient called and needed another lambswool for her left foot. [phone]

## 2023-06-20 ENCOUNTER — Ambulatory Visit (INDEPENDENT_AMBULATORY_CARE_PROVIDER_SITE_OTHER): Payer: 59

## 2023-06-20 DIAGNOSIS — M25572 Pain in left ankle and joints of left foot: Secondary | ICD-10-CM

## 2023-06-20 NOTE — Progress Notes (Signed)
Patients daughter came in today to pick up a new PRAFO boot for pt's left foot. A regular size was given to her and she signed the tablet to sign out the DME.

## 2023-06-21 DIAGNOSIS — I739 Peripheral vascular disease, unspecified: Secondary | ICD-10-CM | POA: Diagnosis not present

## 2023-06-21 DIAGNOSIS — R627 Adult failure to thrive: Secondary | ICD-10-CM | POA: Diagnosis not present

## 2023-06-21 DIAGNOSIS — S88011D Complete traumatic amputation at knee level, right lower leg, subsequent encounter: Secondary | ICD-10-CM | POA: Diagnosis not present

## 2023-06-22 DIAGNOSIS — M79641 Pain in right hand: Secondary | ICD-10-CM | POA: Diagnosis not present

## 2023-06-22 DIAGNOSIS — M25521 Pain in right elbow: Secondary | ICD-10-CM | POA: Diagnosis not present

## 2023-06-22 DIAGNOSIS — M25531 Pain in right wrist: Secondary | ICD-10-CM | POA: Diagnosis not present

## 2023-06-27 DIAGNOSIS — K30 Functional dyspepsia: Secondary | ICD-10-CM | POA: Diagnosis not present

## 2023-06-27 DIAGNOSIS — R110 Nausea: Secondary | ICD-10-CM | POA: Diagnosis not present

## 2023-06-27 DIAGNOSIS — R112 Nausea with vomiting, unspecified: Secondary | ICD-10-CM | POA: Diagnosis not present

## 2023-07-05 DIAGNOSIS — E876 Hypokalemia: Secondary | ICD-10-CM | POA: Diagnosis not present

## 2023-07-05 DIAGNOSIS — I5032 Chronic diastolic (congestive) heart failure: Secondary | ICD-10-CM | POA: Diagnosis not present

## 2023-07-13 ENCOUNTER — Ambulatory Visit (INDEPENDENT_AMBULATORY_CARE_PROVIDER_SITE_OTHER): Payer: 59 | Admitting: Internal Medicine

## 2023-07-13 ENCOUNTER — Encounter: Payer: Self-pay | Admitting: Internal Medicine

## 2023-07-13 VITALS — BP 119/56 | HR 64 | Temp 98.0°F | Ht 69.5 in | Wt 143.0 lb

## 2023-07-13 DIAGNOSIS — R1013 Epigastric pain: Secondary | ICD-10-CM

## 2023-07-13 DIAGNOSIS — R112 Nausea with vomiting, unspecified: Secondary | ICD-10-CM | POA: Diagnosis not present

## 2023-07-13 DIAGNOSIS — G8929 Other chronic pain: Secondary | ICD-10-CM

## 2023-07-13 DIAGNOSIS — K449 Diaphragmatic hernia without obstruction or gangrene: Secondary | ICD-10-CM

## 2023-07-13 DIAGNOSIS — K219 Gastro-esophageal reflux disease without esophagitis: Secondary | ICD-10-CM

## 2023-07-13 DIAGNOSIS — K5904 Chronic idiopathic constipation: Secondary | ICD-10-CM

## 2023-07-13 NOTE — Progress Notes (Signed)
Primary Care Physician:  Assunta Found, MD Primary Gastroenterologist:  Dr. Marletta Lor  Chief complaint: Nausea, epigastric pain   HPI:   [name] is a 89 y.o. female who presents to the clinic today by referral [facility] rehab, Dr. Olena Leatherwood for evaluation.  Patient presented to [facility] ER 05/26/2023 with new onset of nausea and vomiting, epigastric pain.  Treated conservatively discharged home.  CT abdomen pelvis without contrast noted large sliding-type hiatal hernia, large amount of stool burden throughout her colon and rectum.  Since then she has been taking pantoprazole 20 mg daily and her symptoms are vastly improved.  No longer having nausea and vomiting.  States her epigastric pain is improved.  For chronic constipation she was also started on MiraLAX.  She states this helps to well at times.  She is having bowel movements daily.  No straining.  No melena hematochezia.  Overall her GI symptoms are improved.  Past Medical History:  Diagnosis Date   Anxiety    Arthritis    CAD (coronary artery disease)    Mild at cardiac catheterization 2018   Chronic bronchitis    Constipation    Depression    Dysrhythmia    afib 03/04/23   Hyperlipidemia    Hypokalemia 02/12/2017   Hypothyroidism    S/P TAVR (transcatheter aortic valve replacement) 05/25/2022   26mm S3UR via TF approach with Dr. Clifton James and Dr. Laneta Simmers   Severe aortic stenosis    Wound of left leg 01/07/2023    Past Surgical History:  Procedure Laterality Date   AMPUTATION Right 03/18/2023   Procedure: RIGHT ABOVE KNEE AMPUTATION;  Surgeon: [name], MD;  Location: [facility] OR;  Service: Orthopedics;  Laterality: Right;   APPLICATION OF WOUND VAC Right 03/18/2023   Procedure: APPLICATION OF WOUND VAC;  Surgeon: [name], MD;  [facility]: [facility] OR;  Service: Orthopedics;  Laterality: Right;   BUNIONECTOMY Left 1975   CARDIAC CATHETERIZATION  07/04/2020   CHOLECYSTECTOMY N/A 02/15/2017   Procedure:  LAPAROSCOPIC CHOLECYSTECTOMY;  Surgeon: Axel Filler, MD;  Location: [facility] OR;  Service: General;  Laterality: N/A;   CORONARY ANGIOGRAPHY N/A 02/14/2017   Procedure: Coronary Angiography;  Surgeon: Yvonne Kendall, MD;  [facility]: [facility] INVASIVE CV LAB;  Service: Cardiovascular;  Laterality: N/A;   EYE SURGERY  2009   bilateral   HEMATOMA EVACUATION  06/04/2011   Procedure: EVACUATION HEMATOMA;  Surgeon: Fuller Canada, MD;  Location: [facility] ORS;  Service: Orthopedics;  Laterality: Right;  evacuation of seroma   INTRAOPERATIVE TRANSTHORACIC ECHOCARDIOGRAM N/A 05/25/2022   Procedure: INTRAOPERATIVE TRANSTHORACIC ECHOCARDIOGRAM;  Surgeon: Kathleene Hazel, MD;  Location: [facility] INVASIVE CV LAB;  Service: Open Heart Surgery;  Laterality: N/A;   JOINT REPLACEMENT  05/2010   left total knee Dr. Romeo Apple   PATELLAR TENDON REPAIR  06/04/2011   Procedure: PATELLA TENDON REPAIR;  Surgeon: Fuller Canada, MD;  Location: [facility] ORS;  Service: Orthopedics;  Laterality: Right;   PATELLAR TENDON REPAIR  08/30/2011   Procedure: PATELLA TENDON REPAIR;  Surgeon: Fuller Canada, MD;  Location: [facility] ORS;  Service: Orthopedics;  Laterality: Right;  Allograft Reconstrucation Right Patella Tendon   RIGHT/LEFT HEART CATH AND CORONARY ANGIOGRAPHY N/A 07/04/2020   Procedure: RIGHT/LEFT HEART CATH AND CORONARY ANGIOGRAPHY;  Surgeon: Kathleene Hazel, MD;  [facility] INVAS[facility] CV LAB;  Service: Cardiovascular;  Laterality: N/A;   TOTAL HIP ARTHROPLASTY Left 1998   TOTAL KNEE ARTHROPLASTY  05/03/2011   Procedure: TOTAL KNEE ARTHROPLASTY;  Surgeon: Fuller Canada,  MD;  Location: [facility] ORS;  Service: Orthopedics;  Laterality: Right;  With DePuy   TOTAL THYROIDECTOMY  11/2009   Dr. Suszanne Conners @ [facility]   TRANSCATHETER AORTIC VALVE REPLACEMENT, TRANSFEMORAL Right 05/25/2022   Procedure: Transcatheter Aortic Valve Replacement, Transfemoral;  Surgeon: Kathleene Hazel, MD;  Location: [facility] INVASIVE CV LAB;  Service: Open Heart  Surgery;  Laterality: Right;   TUBAL LIGATION      Current Outpatient Medications  Medication Sig Dispense Refill   acetaminophen (TYLENOL) 325 MG tablet Take 2 tablets (650 mg total) by mouth every 6 (six) hours as needed for mild pain (or Fever >/= 101).     aluminum-magnesium hydroxide-simethicone (MAALOX) 200-200-20 MG/5ML SUSP Take 30 mLs by mouth 4 (four) times daily as needed (for heartburn).     aspirin EC 81 MG tablet Take 81 mg by mouth daily. Swallow whole.     furosemide (LASIX) 40 MG tablet Take 1 tablet (40 mg total) by mouth daily. 30 tablet 0   ibuprofen (ADVIL) 600 MG tablet Take 600 mg by mouth every 6 (six) hours as needed.     levothyroxine (SYNTHROID) 150 MCG tablet Take 150 mcg by mouth daily before breakfast.     nitroGLYCERIN (NITROSTAT) 0.4 MG SL tablet Place 1 tablet (0.4 mg total) under the tongue every 5 (five) minutes as needed for chest pain. 25 tablet 3   ondansetron (ZOFRAN-ODT) 4 MG disintegrating tablet Take 4 mg by mouth every 8 (eight) hours as needed for nausea or vomiting.     oxybutynin (DITROPAN XL) 15 MG 24 hr tablet Take 15 mg by mouth daily.     pantoprazole (PROTONIX) 20 MG tablet Take 20 mg by mouth daily.     PARoxetine (PAXIL) 20 MG tablet Take 20 mg by mouth daily.     polyethylene glycol (MIRALAX / GLYCOLAX) 17 g packet Take 17 g by mouth daily.     No current facility-administered medications for this visit.    Allergies as of 07/13/2023   (No Known Allergies)    Family History  Problem Relation Age of Onset   Colon cancer Mother    Blindness Sister    Anesthesia problems Neg Hx    Hypotension Neg Hx    Malignant hyperthermia Neg Hx    Pseudochol deficiency Neg Hx     Social History   Socioeconomic History   Marital status: Widowed    Spouse name: Not on file   Number of children: 2   Years of education: Not on file   Highest education level: Not on file  Occupational History   Occupation: Retired-Office work  Tobacco Use    Smoking status: Former    Current packs/day: 0.00    Types: Cigarettes    Quit date: 09/27/1994    Years since quitting: 28.8   Smokeless tobacco: Never  Vaping Use   Vaping status: Never Used  Substance and Sexual Activity   Alcohol use: No   Drug use: No   Sexual activity: Not Currently  Other Topics Concern   Not on file  Social History Narrative   ** Merged History Encounter **       Social Determinants of Health   Financial Resource Strain: Low Risk  (05/02/2023)   Overall Financial Resource Strain (CARDIA)    Difficulty of Paying Living Expenses: Not very hard  Food Insecurity: No Food Insecurity (05/27/2023)   Received from [facility]   Hunger Vital Sign    Worried About Running Out  of Food in the Last Year: Never true    Ran Out of Food in the Last Year: Never true  Transportation Needs: No Transportation Needs (06/22/2023)   Received from [facility] - Transportation    Lack of Transportation (Medical): No    Lack of Transportation (Non-Medical): No  Physical Activity: Insufficiently Active (05/26/2023)   Received from [facility]   Exercise Vital Sign    Days of Exercise per Week: 4 days    Minutes of Exercise per Session: 30 min  Stress: No Stress Concern Present (05/26/2023)   Received from [facility] - Occupational Stress Questionnaire    Feeling of Stress : Only a little  Social Connections: Moderately Integrated (05/26/2023)   Received from [facility]   Social Connection and Isolation Panel [NHANES]    Frequency of Communication with Friends and Family: Twice a week    Frequency of Social Gatherings with Friends and Family: Twice a week    Attends Religious Services: 1 to 4 times per year    Active Member of [employer] or Organizations: No    Attends Banker Meetings: 1 to 4 times per year    Marital Status: Widowed  Intimate Partner Violence: Not At Risk (03/02/2023)    Humiliation, Afraid, Rape, and Kick questionnaire    Fear of Current or Ex-Partner: No    Emotionally Abused: No    Physically Abused: No    Sexually Abused: No    Subjective: Review of Systems  Constitutional:  Negative for chills and fever.  HENT:  Negative for congestion and hearing loss.   Eyes:  Negative for blurred vision and double vision.  Respiratory:  Negative for cough and shortness of breath.   Cardiovascular:  Negative for chest pain and palpitations.  Gastrointestinal:  Positive for abdominal pain, constipation and nausea. Negative for blood in stool, diarrhea, heartburn, melena and vomiting.  Genitourinary:  Negative for dysuria and urgency.  Musculoskeletal:  Negative for joint pain and myalgias.  Skin:  Negative for itching and rash.  Neurological:  Negative for dizziness and headaches.  Psychiatric/Behavioral:  Negative for depression. The patient is not nervous/anxious.        Objective: BP (!) 119/56 (BP Location: Left Arm, Patient Position: Sitting, Cuff Size: Normal)   Pulse 64   Temp 98 F (36.7 C) (Oral)   Ht 5' 9.5" (1.765 m)   Wt 143 lb (64.9 kg) Comment: facility reported on 06/28/2023  SpO2 97%   BMI 20.81 kg/m  Physical Exam Constitutional:      Appearance: Normal appearance.     Comments: Wheelchair bound  HENT:     Head: Normocephalic and atraumatic.  Eyes:     Extraocular Movements: Extraocular movements intact.     Conjunctiva/sclera: Conjunctivae normal.  Cardiovascular:     Rate and Rhythm: Normal rate and regular rhythm.  Pulmonary:     Effort: Pulmonary effort is normal.     Breath sounds: Normal breath sounds.  Abdominal:     General: Bowel sounds are normal.     Palpations: Abdomen is soft.  Musculoskeletal:        General: Normal range of motion.     Cervical back: Normal range of motion and neck supple.     Comments: S/P R AKA, LLE wrapped  Skin:    General: Skin is warm and dry.     Coloration: Skin is not jaundiced.  Neurological:     General: No focal deficit present.     Mental Status: She is alert and oriented to person, place, and time.  Psychiatric:        Mood and Affect: Mood normal.        Behavior: Behavior normal.      Assessment/Plan:  1.  Epigastric pain, nausea vomiting, large hiatal hernia-reviewed CT results with patient today.  Also showed images of her hernia from prior CT in 2023.  Her symptoms are improved on pantoprazole 20 mg daily.  Will continue for now.  Okay to increase to 40 mg daily if need be.  Continue ondansetron as needed for breakthrough symptoms.  2.  Chronic idiopathic constipation-improved on MiraLAX.  This may have been contributing to her above ER episode as well.  Continue MiraLAX daily.    Follow-up in 3 to 4 months.   07/13/2023 2:30 PM   Disclaimer: This note was dictated with voice recognition software. Similar sounding words can inadvertently be transcribed and may not be corrected upon review.

## 2023-07-14 ENCOUNTER — Ambulatory Visit: Payer: 59 | Admitting: Orthopedic Surgery

## 2023-07-14 DIAGNOSIS — Z89611 Acquired absence of right leg above knee: Secondary | ICD-10-CM

## 2023-07-21 ENCOUNTER — Encounter: Payer: Self-pay | Admitting: Orthopedic Surgery

## 2023-07-21 DIAGNOSIS — S88011D Complete traumatic amputation at knee level, right lower leg, subsequent encounter: Secondary | ICD-10-CM | POA: Diagnosis not present

## 2023-07-21 DIAGNOSIS — I739 Peripheral vascular disease, unspecified: Secondary | ICD-10-CM | POA: Diagnosis not present

## 2023-07-21 DIAGNOSIS — R627 Adult failure to thrive: Secondary | ICD-10-CM | POA: Diagnosis not present

## 2023-07-21 NOTE — Progress Notes (Signed)
Office Visit Note   Patient: [name]           Date of Birth: [birth year]           MRN: [id number] Visit Date: 07/14/2023              Requested by: Assunta Found, MD [address] [place],  [place] [address] PCP: Assunta Found, MD  Chief Complaint  Patient presents with   Right Leg - Follow-up    03/18/23 right AKA      HPI: Patient is an 89 year old woman who is 4 months status post right above-knee amputation.  Patient has also been wearing a PRAFO on the left for a left heel decubitus ulcer.  Assessment & Plan: Visit Diagnoses:  1. S/P AKA (above knee amputation) unilateral, right (HCC)     Plan: Nails were trimmed x 5 on the left.  She will continue with the [facility] on the left.  Follow-Up Instructions: No follow-ups on file.   Ortho Exam  Patient is alert, oriented, no adenopathy, well-dressed, normal affect, normal respiratory effort. Examination the above-knee amputation is well-healed there is no redness no cellulitis no drainage.  The left heel ulcer is improving currently measures 2 x 10 mm with healthy granulation tissue.  Patient has thickened discolored onychomycotic nails x 5 in the left foot and the nails were trimmed x 5 without complication.  Imaging: No results found. No images are attached to the encounter.  Labs: Lab Results  Component Value Date   HGBA1C 5.1 01/07/2023   ESRSEDRATE 33 (H) 03/01/2023   ESRSEDRATE 44 (H) 01/06/2023   ESRSEDRATE 44 (H) 05/29/2011   CRP 2.9 (H) 03/01/2023   CRP 3.4 (H) 01/06/2023   CRP 20.27 (H) 05/29/2011   REPTSTATUS 03/06/2023 FINAL 03/01/2023   GRAMSTAIN  06/04/2011    ABUNDANT WBC PRESENT, PREDOMINANTLY PMN NO SQUAMOUS EPITHELIAL CELLS SEEN NO ORGANISMS SEEN   GRAMSTAIN  06/04/2011    ABUNDANT WBC PRESENT, PREDOMINANTLY PMN NO SQUAMOUS EPITHELIAL CELLS SEEN NO ORGANISMS SEEN   CULT  03/01/2023    NO GROWTH 5 DAYS Performed at [facility], [address], [place], [place]  [address]      Lab Results  Component Value Date   ALBUMIN 2.5 (L) 03/02/2023   ALBUMIN 2.8 (L) 03/01/2023   ALBUMIN 3.2 (L) 01/06/2023    Lab Results  Component Value Date   MG 1.8 03/02/2023   MG 2.0 03/01/2023   MG 1.9 05/26/2022   No results found for: "VD25OH"  No results found for: "PREALBUMIN"    Latest Ref Rng & Units 03/20/2023    5:33 AM 03/19/2023    6:15 AM 03/02/2023    2:42 AM  CBC EXTENDED  WBC 4.0 - 10.5 K/uL 7.8  9.7  8.0   RBC 3.87 - 5.11 MIL/uL 3.05  3.26  3.68   Hemoglobin 12.0 - 15.0 g/dL 8.7  9.2  82.9   HCT 56.2 - 46.0 % 26.8  28.9  32.5   Platelets 150 - 400 K/uL 357  422  235   NEUT# 1.7 - 7.7 K/uL   6.1   Lymph# 0.7 - 4.0 K/uL   1.4      There is no height or weight on file to calculate BMI.  Orders:  No orders of the defined types were placed in this encounter.  No orders of the defined types were placed in this encounter.    Procedures: No procedures performed  Clinical  Data: No additional findings.  ROS:  All other systems negative, except as noted in the HPI. Review of Systems  Objective: Vital Signs: There were no vitals taken for this visit.  Specialty Comments:  No specialty comments available.  PMFS History: Patient Active Problem List   Diagnosis Date Noted   Gangrene of right foot (HCC) 03/18/2023   S/P AKA (above knee amputation) unilateral, right (HCC) 03/18/2023   PVD (peripheral vascular disease) (HCC) 03/02/2023   Osteomyelitis of toe of right foot (HCC) 03/01/2023   Dehydration 03/01/2023   Acute prerenal azotemia 03/01/2023   History of COPD 03/01/2023   Urinary incontinence 03/01/2023   Anemia of chronic disease 03/01/2023   Cellulitis 01/06/2023   Pressure ulcer of sacral region, stage 2 (HCC) 01/06/2023   Intertriginous dermatitis associated with moisture 01/06/2023   S/P TAVR (transcatheter aortic valve replacement) 05/25/2022   Nausea and vomiting 05/18/2022   Severe aortic stenosis    Depression  02/12/2017   Anxiety 02/12/2017   Hypokalemia 02/12/2017   Biliary colic    Acquired hypothyroidism    Hyperlipidemia    KNEE, ARTHRITIS, DEGEN./OSTEO 05/14/2010   Past Medical History:  Diagnosis Date   Anxiety    Arthritis    CAD (coronary artery disease)    Mild at cardiac catheterization 2018   Chronic bronchitis    Constipation    Depression    Dysrhythmia    afib 03/04/23   Hyperlipidemia    Hypokalemia 02/12/2017   Hypothyroidism    S/P TAVR (transcatheter aortic valve replacement) 05/25/2022   26mm S3UR via TF approach with Dr. Clifton James and Dr. Laneta Simmers   Severe aortic stenosis    Wound of left leg 01/07/2023    Family History  Problem Relation Age of Onset   Colon cancer Mother    Blindness Sister    Anesthesia problems Neg Hx    Hypotension Neg Hx    Malignant hyperthermia Neg Hx    Pseudochol deficiency Neg Hx     Past Surgical History:  Procedure Laterality Date   AMPUTATION Right 03/18/2023   Procedure: RIGHT ABOVE KNEE AMPUTATION;  Surgeon: [name], MD;  Location: [facility] OR;  Service: Orthopedics;  Laterality: Right;   APPLICATION OF WOUND VAC Right 03/18/2023   Procedure: APPLICATION OF WOUND VAC;  Surgeon: [name], MD;  Location: [facility] OR;  Service: Orthopedics;  Laterality: Right;   BUNIONECTOMY Left 1975   CARDIAC CATHETERIZATION  07/04/2020   CHOLECYSTECTOMY N/A 02/15/2017   Procedure: LAPAROSCOPIC CHOLECYSTECTOMY;  Surgeon: Axel Filler, MD;  Location: [facility] OR;  Service: General;  Laterality: N/A;   CORONARY ANGIOGRAPHY N/A 02/14/2017   Procedure: Coronary Angiography;  Surgeon: Yvonne Kendall, MD;  Location: [facility] INVASIVE CV LAB;  Service: Cardiovascular;  Laterality: N/A;   EYE SURGERY  2009   bilateral   HEMATOMA EVACUATION  06/04/2011   Procedure: EVACUATION HEMATOMA;  Surgeon: Fuller Canada, MD;  Location: [facility] ORS;  Service: Orthopedics;  Laterality: Right;  evacuation of seroma   INTRAOPERATIVE TRANSTHORACIC ECHOCARDIOGRAM N/A  05/25/2022   Procedure: INTRAOPERATIVE TRANSTHORACIC ECHOCARDIOGRAM;  Surgeon: Kathleene Hazel, MD;  [facility] INVASIVE CV LAB;  Service: Open Heart Surgery;  Laterality: N/A;   JOINT REPLACEMENT  05/2010   left total knee Dr. Romeo Apple   PATELLAR TENDON REPAIR  06/04/2011   Procedure: PATELLA TENDON REPAIR;  Surgeon: Fuller Canada, MD;  Location: [facility] ORS;  Service: Orthopedics;  Laterality: Right;   PATELLAR TENDON REPAIR  08/30/2011   Procedure: PATELLA TENDON  REPAIR;  Surgeon: Fuller Canada, MD;  Location: [facility] ORS;  Service: Orthopedics;  Laterality: Right;  Allograft Reconstrucation Right Patella Tendon   RIGHT/LEFT HEART CATH AND CORONARY ANGIOGRAPHY N/A 07/04/2020   Procedure: RIGHT/LEFT HEART CATH AND CORONARY ANGIOGRAPHY;  Surgeon: Kathleene Hazel, MD;  [facility] INVASIVE CV LAB;  Service: Cardiovascular;  Laterality: N/A;   TOTAL HIP ARTHROPLASTY Left 1998   TOTAL KNEE ARTHROPLASTY  05/03/2011   Procedure: TOTAL KNEE ARTHROPLASTY;  Surgeon: Fuller Canada, MD;  Location: [facility] ORS;  Service: Orthopedics;  Laterality: Right;  With DePuy   TOTAL THYROIDECTOMY  11/2009   Dr. Suszanne Conners @ [facility]   TRANSCATHETER AORTIC VALVE REPLACEMENT, TRANSFEMORAL Right 05/25/2022   Procedure: Transcatheter Aortic Valve Replacement, Transfemoral;  Surgeon: Kathleene Hazel, MD;  Location: [facility] INVASIVE CV LAB;  Service: Open Heart Surgery;  Laterality: Right;   TUBAL LIGATION     Social History   Occupational History   Occupation: Retired-Office work  Tobacco Use   Smoking status: Former    Current packs/day: 0.00    Types: Cigarettes    Quit date: 09/27/1994    Years since quitting: 28.8   Smokeless tobacco: Never  Vaping Use   Vaping status: Never Used  Substance and Sexual Activity   Alcohol use: No   Drug use: No   Sexual activity: Not Currently

## 2023-08-10 DIAGNOSIS — M62521 Muscle wasting and atrophy, not elsewhere classified, right upper arm: Secondary | ICD-10-CM | POA: Diagnosis not present

## 2023-08-10 DIAGNOSIS — M62562 Muscle wasting and atrophy, not elsewhere classified, left lower leg: Secondary | ICD-10-CM | POA: Diagnosis not present

## 2023-08-10 DIAGNOSIS — M62522 Muscle wasting and atrophy, not elsewhere classified, left upper arm: Secondary | ICD-10-CM | POA: Diagnosis not present

## 2023-08-11 DIAGNOSIS — M62562 Muscle wasting and atrophy, not elsewhere classified, left lower leg: Secondary | ICD-10-CM | POA: Diagnosis not present

## 2023-08-11 DIAGNOSIS — M62522 Muscle wasting and atrophy, not elsewhere classified, left upper arm: Secondary | ICD-10-CM | POA: Diagnosis not present

## 2023-08-11 DIAGNOSIS — M62521 Muscle wasting and atrophy, not elsewhere classified, right upper arm: Secondary | ICD-10-CM | POA: Diagnosis not present

## 2023-08-12 DIAGNOSIS — M62522 Muscle wasting and atrophy, not elsewhere classified, left upper arm: Secondary | ICD-10-CM | POA: Diagnosis not present

## 2023-08-12 DIAGNOSIS — M62562 Muscle wasting and atrophy, not elsewhere classified, left lower leg: Secondary | ICD-10-CM | POA: Diagnosis not present

## 2023-08-12 DIAGNOSIS — M62521 Muscle wasting and atrophy, not elsewhere classified, right upper arm: Secondary | ICD-10-CM | POA: Diagnosis not present

## 2023-08-13 DIAGNOSIS — M62562 Muscle wasting and atrophy, not elsewhere classified, left lower leg: Secondary | ICD-10-CM | POA: Diagnosis not present

## 2023-08-13 DIAGNOSIS — M62522 Muscle wasting and atrophy, not elsewhere classified, left upper arm: Secondary | ICD-10-CM | POA: Diagnosis not present

## 2023-08-13 DIAGNOSIS — M62521 Muscle wasting and atrophy, not elsewhere classified, right upper arm: Secondary | ICD-10-CM | POA: Diagnosis not present

## 2023-08-15 DIAGNOSIS — M62521 Muscle wasting and atrophy, not elsewhere classified, right upper arm: Secondary | ICD-10-CM | POA: Diagnosis not present

## 2023-08-15 DIAGNOSIS — M62522 Muscle wasting and atrophy, not elsewhere classified, left upper arm: Secondary | ICD-10-CM | POA: Diagnosis not present

## 2023-08-15 DIAGNOSIS — M62562 Muscle wasting and atrophy, not elsewhere classified, left lower leg: Secondary | ICD-10-CM | POA: Diagnosis not present

## 2023-08-16 DIAGNOSIS — M62522 Muscle wasting and atrophy, not elsewhere classified, left upper arm: Secondary | ICD-10-CM | POA: Diagnosis not present

## 2023-08-16 DIAGNOSIS — M62562 Muscle wasting and atrophy, not elsewhere classified, left lower leg: Secondary | ICD-10-CM | POA: Diagnosis not present

## 2023-08-16 DIAGNOSIS — M62521 Muscle wasting and atrophy, not elsewhere classified, right upper arm: Secondary | ICD-10-CM | POA: Diagnosis not present

## 2023-08-17 DIAGNOSIS — M62562 Muscle wasting and atrophy, not elsewhere classified, left lower leg: Secondary | ICD-10-CM | POA: Diagnosis not present

## 2023-08-17 DIAGNOSIS — M62522 Muscle wasting and atrophy, not elsewhere classified, left upper arm: Secondary | ICD-10-CM | POA: Diagnosis not present

## 2023-08-17 DIAGNOSIS — M62521 Muscle wasting and atrophy, not elsewhere classified, right upper arm: Secondary | ICD-10-CM | POA: Diagnosis not present

## 2023-08-18 DIAGNOSIS — M62522 Muscle wasting and atrophy, not elsewhere classified, left upper arm: Secondary | ICD-10-CM | POA: Diagnosis not present

## 2023-08-18 DIAGNOSIS — M62562 Muscle wasting and atrophy, not elsewhere classified, left lower leg: Secondary | ICD-10-CM | POA: Diagnosis not present

## 2023-08-18 DIAGNOSIS — M62521 Muscle wasting and atrophy, not elsewhere classified, right upper arm: Secondary | ICD-10-CM | POA: Diagnosis not present

## 2023-08-19 ENCOUNTER — Ambulatory Visit: Payer: 59 | Attending: Cardiology | Admitting: Cardiology

## 2023-08-19 ENCOUNTER — Encounter: Payer: Self-pay | Admitting: Cardiology

## 2023-08-19 VITALS — BP 98/60 | HR 64 | Ht 69.5 in | Wt 147.6 lb

## 2023-08-19 DIAGNOSIS — Z952 Presence of prosthetic heart valve: Secondary | ICD-10-CM | POA: Diagnosis not present

## 2023-08-19 DIAGNOSIS — I447 Left bundle-branch block, unspecified: Secondary | ICD-10-CM | POA: Diagnosis not present

## 2023-08-19 DIAGNOSIS — M62522 Muscle wasting and atrophy, not elsewhere classified, left upper arm: Secondary | ICD-10-CM | POA: Diagnosis not present

## 2023-08-19 DIAGNOSIS — M62562 Muscle wasting and atrophy, not elsewhere classified, left lower leg: Secondary | ICD-10-CM | POA: Diagnosis not present

## 2023-08-19 DIAGNOSIS — I480 Paroxysmal atrial fibrillation: Secondary | ICD-10-CM | POA: Diagnosis not present

## 2023-08-19 DIAGNOSIS — M62521 Muscle wasting and atrophy, not elsewhere classified, right upper arm: Secondary | ICD-10-CM | POA: Diagnosis not present

## 2023-08-19 DIAGNOSIS — E782 Mixed hyperlipidemia: Secondary | ICD-10-CM

## 2023-08-19 NOTE — Patient Instructions (Signed)
Medication Instructions:   Your physician recommends that you continue on your current medications as directed. Please refer to the Current Medication list given to you today.   Labwork: Fasting lipids  Testing/Procedures: None   Follow-Up: 6 months  Any Other Special Instructions Will Be Listed Below (If Applicable).  If you need a refill on your cardiac medications before your next appointment, please call your pharmacy.

## 2023-08-19 NOTE — Progress Notes (Signed)
Cardiology Office Note  Date: 08/19/2023   ID: [name], DOB [birth year], MRN [id number]  History of Present Illness: [name] is an 89 y.o. female last seen in February with follow-up noted in the structural heart clinic in August, I reviewed the note.  She is here today with her daughter for a follow-up visit.  Currently in the [facility] nursing center undergoing PT.  She underwent right AKA back in June following bout with cellulitis/osteomyelitis.  She does not report any chest pain, NYHA class II dyspnea with typical activities.  She is in a wheelchair today, no recent falls.  I reviewed her medications.  Current cardiac regimen includes aspirin and Lasix only.  She has not had a recent FLP, LDL was 152 in December 2023.  I reviewed interval ECGs, she has had paroxysmal atrial fibrillation, but not anticoagulated given concern for fall and bleeding risk.  She does not report any palpitations.  Blood pressure mildly reduced today, she is asymptomatic and denies any orthostatic lightheadedness at baseline.  Physical Exam: VS:  BP 98/60   Pulse 64   Ht 5' 9.5" (1.765 m)   Wt 147 lb 9.6 oz (67 kg)   SpO2 97%   BMI 21.48 kg/m , BMI Body mass index is 21.48 kg/m.  Wt Readings from Last 3 Encounters:  08/19/23 147 lb 9.6 oz (67 kg)  07/13/23 143 lb (64.9 kg)  05/25/23 154 lb 9.6 oz (70.1 kg)    General: Patient appears comfortable at rest.  In wheelchair. HEENT: Conjunctiva and lids normal. Neck: Supple, no elevated JVP or carotid bruits. Lungs: Clear to auscultation, nonlabored breathing at rest. Cardiac: Regular rate and rhythm, no S3, 2/6 systolic murmur. Extremities: Status post right AKA.  ECG:  An ECG dated 03/18/2023 was personally reviewed today and demonstrated:  Sinus rhythm with PACs and left bundle branch block.  Labwork: December 2023: Cholesterol 225, glycerides 105, HDL 54, LDL 152 01/06/2023: B Natriuretic Peptide 170.6 03/02/2023:  ALT 17; AST 29; Magnesium 1.8 03/20/2023: BUN 31; Creatinine, Ser 1.04; Hemoglobin 8.7; Platelets 357; Potassium 3.7; Sodium 135   Other Studies Reviewed Today:  Echocardiogram 05/25/2023:  1. Left ventricular ejection fraction, by estimation, is 60 to 65%. The  left ventricle has normal function. The left ventricle has no regional  wall motion abnormalities. There is mild concentric left ventricular  hypertrophy. Left ventricular diastolic  parameters are consistent with Grade I diastolic dysfunction (impaired  relaxation).   2. Right ventricular systolic function is normal. The right ventricular  size is normal. There is normal pulmonary artery systolic pressure.   3. Left atrial size was severely dilated.   4. Right atrial size was mildly dilated.   5. The mitral valve is degenerative. No evidence of mitral valve  regurgitation. No evidence of mitral stenosis. Moderate mitral annular  calcification.   6. The aortic valve has been repaired/replaced. Aortic valve  regurgitation is not visualized. No aortic stenosis is present. Echo  findings are consistent with normal structure and function of the aortic  valve prosthesis. Aortic valve area, by VTI  measures 1.54 cm. Aortic valve mean gradient measures 12.0 mmHg. Aortic  valve Vmax measures 2.20 m/s.   7. The inferior vena cava is normal in size with greater than 50%  respiratory variability, suggesting right atrial pressure of 3 mmHg.   Assessment and Plan:  1.  History of severe aortic stenosis status post TAVR in August 2023, 26 mm Edwards SAPIEN 3 THV.  Follow-up echocardiogram in August of this year revealed mean AV gradient 12 mmHg and no aortic regurgitation.  NYHA class II dyspnea with low-level activity.  Continue aspirin.   2.  Paroxysmal atrial fibrillation, asymptomatic.  CHA2DS2-VASc score is 4.  She is not anticoagulated given concern for increased bleeding risk and falls.   3.  Left bundle branch block following TAVR,  asymptomatic and with cardiac monitor from October 2023 showing no significant pauses or heart block.  She is currently not on any AV nodal blockers.  4.  Status post right AKA in June of this year following cellulitis/osteomyelitis.  5.  Mixed hyperlipidemia, follow-up FLP.  We did discuss statin therapy today in light of known vascular disease.  Disposition:  Follow up  6 months.  Signed, Jonelle Sidle, M.D., F.A.C.C. [facility] at [facility]

## 2023-08-21 DIAGNOSIS — I739 Peripheral vascular disease, unspecified: Secondary | ICD-10-CM | POA: Diagnosis not present

## 2023-08-21 DIAGNOSIS — S88011D Complete traumatic amputation at knee level, right lower leg, subsequent encounter: Secondary | ICD-10-CM | POA: Diagnosis not present

## 2023-08-21 DIAGNOSIS — R627 Adult failure to thrive: Secondary | ICD-10-CM | POA: Diagnosis not present

## 2023-08-22 DIAGNOSIS — M62521 Muscle wasting and atrophy, not elsewhere classified, right upper arm: Secondary | ICD-10-CM | POA: Diagnosis not present

## 2023-08-22 DIAGNOSIS — M62522 Muscle wasting and atrophy, not elsewhere classified, left upper arm: Secondary | ICD-10-CM | POA: Diagnosis not present

## 2023-08-22 DIAGNOSIS — M62562 Muscle wasting and atrophy, not elsewhere classified, left lower leg: Secondary | ICD-10-CM | POA: Diagnosis not present

## 2023-08-23 DIAGNOSIS — M62562 Muscle wasting and atrophy, not elsewhere classified, left lower leg: Secondary | ICD-10-CM | POA: Diagnosis not present

## 2023-08-23 DIAGNOSIS — M62522 Muscle wasting and atrophy, not elsewhere classified, left upper arm: Secondary | ICD-10-CM | POA: Diagnosis not present

## 2023-08-23 DIAGNOSIS — M62521 Muscle wasting and atrophy, not elsewhere classified, right upper arm: Secondary | ICD-10-CM | POA: Diagnosis not present

## 2023-08-24 ENCOUNTER — Telehealth: Payer: Self-pay

## 2023-08-24 DIAGNOSIS — M62522 Muscle wasting and atrophy, not elsewhere classified, left upper arm: Secondary | ICD-10-CM | POA: Diagnosis not present

## 2023-08-24 DIAGNOSIS — M62521 Muscle wasting and atrophy, not elsewhere classified, right upper arm: Secondary | ICD-10-CM | POA: Diagnosis not present

## 2023-08-24 DIAGNOSIS — M62562 Muscle wasting and atrophy, not elsewhere classified, left lower leg: Secondary | ICD-10-CM | POA: Diagnosis not present

## 2023-08-24 MED ORDER — ROSUVASTATIN CALCIUM 20 MG PO TABS: 20.0000 mg | ORAL_TABLET | Freq: Every day | ORAL | 3 refills | Status: DC

## 2023-08-24 NOTE — Telephone Encounter (Signed)
Lab results discussed with Meriam Sprague at [facility], I will fax results notes to [phone] Patient will start Crstor 20 mg every evening.

## 2023-08-24 NOTE — Telephone Encounter (Signed)
-----   Message from Nona Dell sent at 08/23/2023 12:57 PM EST ----- Results reviewed.  Lipid panel shows total cholesterol 204 and LDL 134.  Suggest trying Crestor 20 mg daily.

## 2023-08-25 DIAGNOSIS — M62562 Muscle wasting and atrophy, not elsewhere classified, left lower leg: Secondary | ICD-10-CM | POA: Diagnosis not present

## 2023-08-25 DIAGNOSIS — M62521 Muscle wasting and atrophy, not elsewhere classified, right upper arm: Secondary | ICD-10-CM | POA: Diagnosis not present

## 2023-08-25 DIAGNOSIS — M62522 Muscle wasting and atrophy, not elsewhere classified, left upper arm: Secondary | ICD-10-CM | POA: Diagnosis not present

## 2023-08-30 DIAGNOSIS — M62521 Muscle wasting and atrophy, not elsewhere classified, right upper arm: Secondary | ICD-10-CM | POA: Diagnosis not present

## 2023-08-30 DIAGNOSIS — M62522 Muscle wasting and atrophy, not elsewhere classified, left upper arm: Secondary | ICD-10-CM | POA: Diagnosis not present

## 2023-08-30 DIAGNOSIS — R293 Abnormal posture: Secondary | ICD-10-CM | POA: Diagnosis not present

## 2023-08-30 DIAGNOSIS — M62562 Muscle wasting and atrophy, not elsewhere classified, left lower leg: Secondary | ICD-10-CM | POA: Diagnosis not present

## 2023-08-31 DIAGNOSIS — R293 Abnormal posture: Secondary | ICD-10-CM | POA: Diagnosis not present

## 2023-08-31 DIAGNOSIS — M62522 Muscle wasting and atrophy, not elsewhere classified, left upper arm: Secondary | ICD-10-CM | POA: Diagnosis not present

## 2023-08-31 DIAGNOSIS — M62521 Muscle wasting and atrophy, not elsewhere classified, right upper arm: Secondary | ICD-10-CM | POA: Diagnosis not present

## 2023-08-31 DIAGNOSIS — M62562 Muscle wasting and atrophy, not elsewhere classified, left lower leg: Secondary | ICD-10-CM | POA: Diagnosis not present

## 2023-09-01 DIAGNOSIS — M62522 Muscle wasting and atrophy, not elsewhere classified, left upper arm: Secondary | ICD-10-CM | POA: Diagnosis not present

## 2023-09-01 DIAGNOSIS — M62562 Muscle wasting and atrophy, not elsewhere classified, left lower leg: Secondary | ICD-10-CM | POA: Diagnosis not present

## 2023-09-01 DIAGNOSIS — M62521 Muscle wasting and atrophy, not elsewhere classified, right upper arm: Secondary | ICD-10-CM | POA: Diagnosis not present

## 2023-09-01 DIAGNOSIS — R293 Abnormal posture: Secondary | ICD-10-CM | POA: Diagnosis not present

## 2023-09-02 DIAGNOSIS — M62562 Muscle wasting and atrophy, not elsewhere classified, left lower leg: Secondary | ICD-10-CM | POA: Diagnosis not present

## 2023-09-02 DIAGNOSIS — R293 Abnormal posture: Secondary | ICD-10-CM | POA: Diagnosis not present

## 2023-09-02 DIAGNOSIS — M62522 Muscle wasting and atrophy, not elsewhere classified, left upper arm: Secondary | ICD-10-CM | POA: Diagnosis not present

## 2023-09-02 DIAGNOSIS — M62521 Muscle wasting and atrophy, not elsewhere classified, right upper arm: Secondary | ICD-10-CM | POA: Diagnosis not present

## 2023-09-05 DIAGNOSIS — R293 Abnormal posture: Secondary | ICD-10-CM | POA: Diagnosis not present

## 2023-09-05 DIAGNOSIS — M62522 Muscle wasting and atrophy, not elsewhere classified, left upper arm: Secondary | ICD-10-CM | POA: Diagnosis not present

## 2023-09-05 DIAGNOSIS — M62562 Muscle wasting and atrophy, not elsewhere classified, left lower leg: Secondary | ICD-10-CM | POA: Diagnosis not present

## 2023-09-05 DIAGNOSIS — M62521 Muscle wasting and atrophy, not elsewhere classified, right upper arm: Secondary | ICD-10-CM | POA: Diagnosis not present

## 2023-09-06 DIAGNOSIS — L602 Onychogryphosis: Secondary | ICD-10-CM | POA: Diagnosis not present

## 2023-09-06 DIAGNOSIS — L603 Nail dystrophy: Secondary | ICD-10-CM | POA: Diagnosis not present

## 2023-09-06 DIAGNOSIS — R293 Abnormal posture: Secondary | ICD-10-CM | POA: Diagnosis not present

## 2023-09-06 DIAGNOSIS — M62522 Muscle wasting and atrophy, not elsewhere classified, left upper arm: Secondary | ICD-10-CM | POA: Diagnosis not present

## 2023-09-06 DIAGNOSIS — M62521 Muscle wasting and atrophy, not elsewhere classified, right upper arm: Secondary | ICD-10-CM | POA: Diagnosis not present

## 2023-09-06 DIAGNOSIS — L84 Corns and callosities: Secondary | ICD-10-CM | POA: Diagnosis not present

## 2023-09-06 DIAGNOSIS — M62562 Muscle wasting and atrophy, not elsewhere classified, left lower leg: Secondary | ICD-10-CM | POA: Diagnosis not present

## 2023-09-06 DIAGNOSIS — I739 Peripheral vascular disease, unspecified: Secondary | ICD-10-CM | POA: Diagnosis not present

## 2023-09-07 DIAGNOSIS — M62522 Muscle wasting and atrophy, not elsewhere classified, left upper arm: Secondary | ICD-10-CM | POA: Diagnosis not present

## 2023-09-07 DIAGNOSIS — M62562 Muscle wasting and atrophy, not elsewhere classified, left lower leg: Secondary | ICD-10-CM | POA: Diagnosis not present

## 2023-09-07 DIAGNOSIS — M62521 Muscle wasting and atrophy, not elsewhere classified, right upper arm: Secondary | ICD-10-CM | POA: Diagnosis not present

## 2023-09-07 DIAGNOSIS — R293 Abnormal posture: Secondary | ICD-10-CM | POA: Diagnosis not present

## 2023-09-08 DIAGNOSIS — R293 Abnormal posture: Secondary | ICD-10-CM | POA: Diagnosis not present

## 2023-09-08 DIAGNOSIS — M62562 Muscle wasting and atrophy, not elsewhere classified, left lower leg: Secondary | ICD-10-CM | POA: Diagnosis not present

## 2023-09-08 DIAGNOSIS — M62521 Muscle wasting and atrophy, not elsewhere classified, right upper arm: Secondary | ICD-10-CM | POA: Diagnosis not present

## 2023-09-08 DIAGNOSIS — M62522 Muscle wasting and atrophy, not elsewhere classified, left upper arm: Secondary | ICD-10-CM | POA: Diagnosis not present

## 2023-09-09 DIAGNOSIS — M62522 Muscle wasting and atrophy, not elsewhere classified, left upper arm: Secondary | ICD-10-CM | POA: Diagnosis not present

## 2023-09-09 DIAGNOSIS — M62521 Muscle wasting and atrophy, not elsewhere classified, right upper arm: Secondary | ICD-10-CM | POA: Diagnosis not present

## 2023-09-09 DIAGNOSIS — M62562 Muscle wasting and atrophy, not elsewhere classified, left lower leg: Secondary | ICD-10-CM | POA: Diagnosis not present

## 2023-09-09 DIAGNOSIS — R293 Abnormal posture: Secondary | ICD-10-CM | POA: Diagnosis not present

## 2023-09-11 DIAGNOSIS — M62562 Muscle wasting and atrophy, not elsewhere classified, left lower leg: Secondary | ICD-10-CM | POA: Diagnosis not present

## 2023-09-11 DIAGNOSIS — M62522 Muscle wasting and atrophy, not elsewhere classified, left upper arm: Secondary | ICD-10-CM | POA: Diagnosis not present

## 2023-09-11 DIAGNOSIS — M62521 Muscle wasting and atrophy, not elsewhere classified, right upper arm: Secondary | ICD-10-CM | POA: Diagnosis not present

## 2023-09-11 DIAGNOSIS — R293 Abnormal posture: Secondary | ICD-10-CM | POA: Diagnosis not present

## 2023-09-12 DIAGNOSIS — M62522 Muscle wasting and atrophy, not elsewhere classified, left upper arm: Secondary | ICD-10-CM | POA: Diagnosis not present

## 2023-09-12 DIAGNOSIS — R293 Abnormal posture: Secondary | ICD-10-CM | POA: Diagnosis not present

## 2023-09-12 DIAGNOSIS — M62521 Muscle wasting and atrophy, not elsewhere classified, right upper arm: Secondary | ICD-10-CM | POA: Diagnosis not present

## 2023-09-12 DIAGNOSIS — M62562 Muscle wasting and atrophy, not elsewhere classified, left lower leg: Secondary | ICD-10-CM | POA: Diagnosis not present

## 2023-09-13 DIAGNOSIS — M62562 Muscle wasting and atrophy, not elsewhere classified, left lower leg: Secondary | ICD-10-CM | POA: Diagnosis not present

## 2023-09-13 DIAGNOSIS — M62522 Muscle wasting and atrophy, not elsewhere classified, left upper arm: Secondary | ICD-10-CM | POA: Diagnosis not present

## 2023-09-13 DIAGNOSIS — M62521 Muscle wasting and atrophy, not elsewhere classified, right upper arm: Secondary | ICD-10-CM | POA: Diagnosis not present

## 2023-09-13 DIAGNOSIS — R293 Abnormal posture: Secondary | ICD-10-CM | POA: Diagnosis not present

## 2023-09-14 DIAGNOSIS — M62521 Muscle wasting and atrophy, not elsewhere classified, right upper arm: Secondary | ICD-10-CM | POA: Diagnosis not present

## 2023-09-14 DIAGNOSIS — M62562 Muscle wasting and atrophy, not elsewhere classified, left lower leg: Secondary | ICD-10-CM | POA: Diagnosis not present

## 2023-09-14 DIAGNOSIS — R293 Abnormal posture: Secondary | ICD-10-CM | POA: Diagnosis not present

## 2023-09-14 DIAGNOSIS — M62522 Muscle wasting and atrophy, not elsewhere classified, left upper arm: Secondary | ICD-10-CM | POA: Diagnosis not present

## 2023-09-15 DIAGNOSIS — R293 Abnormal posture: Secondary | ICD-10-CM | POA: Diagnosis not present

## 2023-09-15 DIAGNOSIS — M62521 Muscle wasting and atrophy, not elsewhere classified, right upper arm: Secondary | ICD-10-CM | POA: Diagnosis not present

## 2023-09-15 DIAGNOSIS — M62562 Muscle wasting and atrophy, not elsewhere classified, left lower leg: Secondary | ICD-10-CM | POA: Diagnosis not present

## 2023-09-15 DIAGNOSIS — M62522 Muscle wasting and atrophy, not elsewhere classified, left upper arm: Secondary | ICD-10-CM | POA: Diagnosis not present

## 2023-09-16 DIAGNOSIS — M62522 Muscle wasting and atrophy, not elsewhere classified, left upper arm: Secondary | ICD-10-CM | POA: Diagnosis not present

## 2023-09-16 DIAGNOSIS — M62521 Muscle wasting and atrophy, not elsewhere classified, right upper arm: Secondary | ICD-10-CM | POA: Diagnosis not present

## 2023-09-16 DIAGNOSIS — M62562 Muscle wasting and atrophy, not elsewhere classified, left lower leg: Secondary | ICD-10-CM | POA: Diagnosis not present

## 2023-09-16 DIAGNOSIS — R293 Abnormal posture: Secondary | ICD-10-CM | POA: Diagnosis not present

## 2023-09-17 DIAGNOSIS — R627 Adult failure to thrive: Secondary | ICD-10-CM | POA: Diagnosis not present

## 2023-09-17 DIAGNOSIS — I739 Peripheral vascular disease, unspecified: Secondary | ICD-10-CM | POA: Diagnosis not present

## 2023-09-17 DIAGNOSIS — S88011D Complete traumatic amputation at knee level, right lower leg, subsequent encounter: Secondary | ICD-10-CM | POA: Diagnosis not present

## 2023-09-19 DIAGNOSIS — M62522 Muscle wasting and atrophy, not elsewhere classified, left upper arm: Secondary | ICD-10-CM | POA: Diagnosis not present

## 2023-09-19 DIAGNOSIS — R293 Abnormal posture: Secondary | ICD-10-CM | POA: Diagnosis not present

## 2023-09-19 DIAGNOSIS — M62562 Muscle wasting and atrophy, not elsewhere classified, left lower leg: Secondary | ICD-10-CM | POA: Diagnosis not present

## 2023-09-19 DIAGNOSIS — M62521 Muscle wasting and atrophy, not elsewhere classified, right upper arm: Secondary | ICD-10-CM | POA: Diagnosis not present

## 2023-09-20 DIAGNOSIS — M62562 Muscle wasting and atrophy, not elsewhere classified, left lower leg: Secondary | ICD-10-CM | POA: Diagnosis not present

## 2023-09-20 DIAGNOSIS — M62521 Muscle wasting and atrophy, not elsewhere classified, right upper arm: Secondary | ICD-10-CM | POA: Diagnosis not present

## 2023-09-20 DIAGNOSIS — M62522 Muscle wasting and atrophy, not elsewhere classified, left upper arm: Secondary | ICD-10-CM | POA: Diagnosis not present

## 2023-09-20 DIAGNOSIS — R293 Abnormal posture: Secondary | ICD-10-CM | POA: Diagnosis not present

## 2023-09-22 DIAGNOSIS — M62521 Muscle wasting and atrophy, not elsewhere classified, right upper arm: Secondary | ICD-10-CM | POA: Diagnosis not present

## 2023-09-22 DIAGNOSIS — M62522 Muscle wasting and atrophy, not elsewhere classified, left upper arm: Secondary | ICD-10-CM | POA: Diagnosis not present

## 2023-09-22 DIAGNOSIS — M62562 Muscle wasting and atrophy, not elsewhere classified, left lower leg: Secondary | ICD-10-CM | POA: Diagnosis not present

## 2023-09-22 DIAGNOSIS — R293 Abnormal posture: Secondary | ICD-10-CM | POA: Diagnosis not present

## 2023-09-23 DIAGNOSIS — R293 Abnormal posture: Secondary | ICD-10-CM | POA: Diagnosis not present

## 2023-09-23 DIAGNOSIS — M62522 Muscle wasting and atrophy, not elsewhere classified, left upper arm: Secondary | ICD-10-CM | POA: Diagnosis not present

## 2023-09-23 DIAGNOSIS — M62521 Muscle wasting and atrophy, not elsewhere classified, right upper arm: Secondary | ICD-10-CM | POA: Diagnosis not present

## 2023-09-23 DIAGNOSIS — M62562 Muscle wasting and atrophy, not elsewhere classified, left lower leg: Secondary | ICD-10-CM | POA: Diagnosis not present

## 2023-09-24 DIAGNOSIS — R293 Abnormal posture: Secondary | ICD-10-CM | POA: Diagnosis not present

## 2023-09-24 DIAGNOSIS — M62521 Muscle wasting and atrophy, not elsewhere classified, right upper arm: Secondary | ICD-10-CM | POA: Diagnosis not present

## 2023-09-24 DIAGNOSIS — M62562 Muscle wasting and atrophy, not elsewhere classified, left lower leg: Secondary | ICD-10-CM | POA: Diagnosis not present

## 2023-09-24 DIAGNOSIS — M62522 Muscle wasting and atrophy, not elsewhere classified, left upper arm: Secondary | ICD-10-CM | POA: Diagnosis not present

## 2023-09-26 DIAGNOSIS — M62521 Muscle wasting and atrophy, not elsewhere classified, right upper arm: Secondary | ICD-10-CM | POA: Diagnosis not present

## 2023-09-26 DIAGNOSIS — M62562 Muscle wasting and atrophy, not elsewhere classified, left lower leg: Secondary | ICD-10-CM | POA: Diagnosis not present

## 2023-09-26 DIAGNOSIS — R293 Abnormal posture: Secondary | ICD-10-CM | POA: Diagnosis not present

## 2023-09-26 DIAGNOSIS — M62522 Muscle wasting and atrophy, not elsewhere classified, left upper arm: Secondary | ICD-10-CM | POA: Diagnosis not present

## 2023-09-27 DIAGNOSIS — M62521 Muscle wasting and atrophy, not elsewhere classified, right upper arm: Secondary | ICD-10-CM | POA: Diagnosis not present

## 2023-09-27 DIAGNOSIS — R293 Abnormal posture: Secondary | ICD-10-CM | POA: Diagnosis not present

## 2023-09-27 DIAGNOSIS — M62562 Muscle wasting and atrophy, not elsewhere classified, left lower leg: Secondary | ICD-10-CM | POA: Diagnosis not present

## 2023-09-27 DIAGNOSIS — M62522 Muscle wasting and atrophy, not elsewhere classified, left upper arm: Secondary | ICD-10-CM | POA: Diagnosis not present

## 2023-09-28 DIAGNOSIS — R293 Abnormal posture: Secondary | ICD-10-CM | POA: Diagnosis not present

## 2023-09-28 DIAGNOSIS — M62562 Muscle wasting and atrophy, not elsewhere classified, left lower leg: Secondary | ICD-10-CM | POA: Diagnosis not present

## 2023-09-28 DIAGNOSIS — M62561 Muscle wasting and atrophy, not elsewhere classified, right lower leg: Secondary | ICD-10-CM | POA: Diagnosis not present

## 2023-09-29 DIAGNOSIS — M62562 Muscle wasting and atrophy, not elsewhere classified, left lower leg: Secondary | ICD-10-CM | POA: Diagnosis not present

## 2023-09-29 DIAGNOSIS — M62561 Muscle wasting and atrophy, not elsewhere classified, right lower leg: Secondary | ICD-10-CM | POA: Diagnosis not present

## 2023-09-29 DIAGNOSIS — R293 Abnormal posture: Secondary | ICD-10-CM | POA: Diagnosis not present

## 2023-09-30 DIAGNOSIS — M62562 Muscle wasting and atrophy, not elsewhere classified, left lower leg: Secondary | ICD-10-CM | POA: Diagnosis not present

## 2023-09-30 DIAGNOSIS — R293 Abnormal posture: Secondary | ICD-10-CM | POA: Diagnosis not present

## 2023-09-30 DIAGNOSIS — M62561 Muscle wasting and atrophy, not elsewhere classified, right lower leg: Secondary | ICD-10-CM | POA: Diagnosis not present

## 2023-10-01 DIAGNOSIS — M62562 Muscle wasting and atrophy, not elsewhere classified, left lower leg: Secondary | ICD-10-CM | POA: Diagnosis not present

## 2023-10-01 DIAGNOSIS — R293 Abnormal posture: Secondary | ICD-10-CM | POA: Diagnosis not present

## 2023-10-01 DIAGNOSIS — M62561 Muscle wasting and atrophy, not elsewhere classified, right lower leg: Secondary | ICD-10-CM | POA: Diagnosis not present

## 2023-10-04 DIAGNOSIS — M62561 Muscle wasting and atrophy, not elsewhere classified, right lower leg: Secondary | ICD-10-CM | POA: Diagnosis not present

## 2023-10-04 DIAGNOSIS — M62562 Muscle wasting and atrophy, not elsewhere classified, left lower leg: Secondary | ICD-10-CM | POA: Diagnosis not present

## 2023-10-04 DIAGNOSIS — R293 Abnormal posture: Secondary | ICD-10-CM | POA: Diagnosis not present

## 2023-10-05 DIAGNOSIS — M62562 Muscle wasting and atrophy, not elsewhere classified, left lower leg: Secondary | ICD-10-CM | POA: Diagnosis not present

## 2023-10-05 DIAGNOSIS — M62561 Muscle wasting and atrophy, not elsewhere classified, right lower leg: Secondary | ICD-10-CM | POA: Diagnosis not present

## 2023-10-05 DIAGNOSIS — R293 Abnormal posture: Secondary | ICD-10-CM | POA: Diagnosis not present

## 2023-10-06 DIAGNOSIS — R293 Abnormal posture: Secondary | ICD-10-CM | POA: Diagnosis not present

## 2023-10-06 DIAGNOSIS — M62562 Muscle wasting and atrophy, not elsewhere classified, left lower leg: Secondary | ICD-10-CM | POA: Diagnosis not present

## 2023-10-06 DIAGNOSIS — M62561 Muscle wasting and atrophy, not elsewhere classified, right lower leg: Secondary | ICD-10-CM | POA: Diagnosis not present

## 2023-10-07 DIAGNOSIS — M62562 Muscle wasting and atrophy, not elsewhere classified, left lower leg: Secondary | ICD-10-CM | POA: Diagnosis not present

## 2023-10-07 DIAGNOSIS — R293 Abnormal posture: Secondary | ICD-10-CM | POA: Diagnosis not present

## 2023-10-07 DIAGNOSIS — M62561 Muscle wasting and atrophy, not elsewhere classified, right lower leg: Secondary | ICD-10-CM | POA: Diagnosis not present

## 2023-10-10 DIAGNOSIS — M62562 Muscle wasting and atrophy, not elsewhere classified, left lower leg: Secondary | ICD-10-CM | POA: Diagnosis not present

## 2023-10-10 DIAGNOSIS — M62561 Muscle wasting and atrophy, not elsewhere classified, right lower leg: Secondary | ICD-10-CM | POA: Diagnosis not present

## 2023-10-10 DIAGNOSIS — R293 Abnormal posture: Secondary | ICD-10-CM | POA: Diagnosis not present

## 2023-10-12 DIAGNOSIS — M62561 Muscle wasting and atrophy, not elsewhere classified, right lower leg: Secondary | ICD-10-CM | POA: Diagnosis not present

## 2023-10-12 DIAGNOSIS — M62562 Muscle wasting and atrophy, not elsewhere classified, left lower leg: Secondary | ICD-10-CM | POA: Diagnosis not present

## 2023-10-12 DIAGNOSIS — R293 Abnormal posture: Secondary | ICD-10-CM | POA: Diagnosis not present

## 2023-10-13 ENCOUNTER — Encounter: Payer: Self-pay | Admitting: Gastroenterology

## 2023-10-13 ENCOUNTER — Ambulatory Visit (INDEPENDENT_AMBULATORY_CARE_PROVIDER_SITE_OTHER): Payer: 59 | Admitting: Gastroenterology

## 2023-10-13 VITALS — BP 121/53 | HR 64 | Temp 99.1°F | Ht 69.5 in | Wt 135.9 lb

## 2023-10-13 DIAGNOSIS — M62561 Muscle wasting and atrophy, not elsewhere classified, right lower leg: Secondary | ICD-10-CM | POA: Diagnosis not present

## 2023-10-13 DIAGNOSIS — R293 Abnormal posture: Secondary | ICD-10-CM | POA: Diagnosis not present

## 2023-10-13 DIAGNOSIS — K219 Gastro-esophageal reflux disease without esophagitis: Secondary | ICD-10-CM | POA: Insufficient documentation

## 2023-10-13 DIAGNOSIS — M62562 Muscle wasting and atrophy, not elsewhere classified, left lower leg: Secondary | ICD-10-CM | POA: Diagnosis not present

## 2023-10-13 NOTE — Progress Notes (Unsigned)
Gastroenterology Office Note     Primary Care Physician:  Toma Deiters, MD  Primary Gastroenterologist:   Chief Complaint   Chief Complaint  Patient presents with   Follow-up    Pt arrives for follow up. Pt states she is doing OK today. Pt states protonix has been helping. Rehab nurses states pt is taking Zofran daily.     History of Present Illness   [name] is a 89 y.o. female presenting today with a history of   CT abdomen pelvis without contrast noted large sliding-type hiatal hernia, large amount of stool burden throughout her colon and rectum.   GERDL  Constipation  If laying down in bed, has GERD symptoms. If sitting up, will be fine. Facility makes sure bed is raised.  Miralax daily.   Had Covid 4 weeks ago.   Right BKA> working with PT for strength and mobility.      Past Medical History:  Diagnosis Date   Anxiety    Arthritis    CAD (coronary artery disease)    Mild at cardiac catheterization 2018   Chronic bronchitis    Constipation    Depression    Dysrhythmia    afib 03/04/23   Hyperlipidemia    Hypokalemia 02/12/2017   Hypothyroidism    S/P TAVR (transcatheter aortic valve replacement) 05/25/2022   26mm S3UR via TF approach with Dr. Clifton James and Dr. Laneta Simmers   Severe aortic stenosis    Wound of left leg 01/07/2023    Past Surgical History:  Procedure Laterality Date   AMPUTATION Right 03/18/2023   Procedure: RIGHT ABOVE KNEE AMPUTATION;  Surgeon: [name], MD;  Location: [facility] OR;  Service: Orthopedics;  Laterality: Right;   APPLICATION OF WOUND VAC Right 03/18/2023   Procedure: APPLICATION OF WOUND VAC;  Surgeon: [name], MD;  Location: [facility] OR;  Service: Orthopedics;  Laterality: Right;   BUNIONECTOMY Left 1975   CARDIAC CATHETERIZATION  07/04/2020   CHOLECYSTECTOMY N/A 02/15/2017   Procedure: LAPAROSCOPIC CHOLECYSTECTOMY;  Surgeon: Axel Filler, MD;  Location: [facility] OR;  Service: General;  Laterality: N/A;    CORONARY ANGIOGRAPHY N/A 02/14/2017   Procedure: Coronary Angiography;  Surgeon: Yvonne Kendall, MD;  Location: [facility] INVASIVE CV LAB;  Service: Cardiovascular;  Laterality: N/A;   EYE SURGERY  2009   bilateral   HEMATOMA EVACUATION  06/04/2011   Procedure: EVACUATION HEMATOMA;  Surgeon: Fuller Canada, MD;  Location: [facility] ORS;  Service: Orthopedics;  Laterality: Right;  evacuation of seroma   INTRAOPERATIVE TRANSTHORACIC ECHOCARDIOGRAM N/A 05/25/2022   Procedure: INTRAOPERATIVE TRANSTHORACIC ECHOCARDIOGRAM;  Surgeon: Kathleene Hazel, MD;  Location: [facility] INVASIVE CV LAB;  Service: Open Heart Surgery;  Laterality: N/A;   JOINT REPLACEMENT  05/2010   left total knee Dr. Romeo Apple   PATELLAR TENDON REPAIR  06/04/2011   Procedure: PATELLA TENDON REPAIR;  Surgeon: Fuller Canada, MD;  Location: [facility] ORS;  Service: Orthopedics;  Laterality: Right;   PATELLAR TENDON REPAIR  08/30/2011   Procedure: PATELLA TENDON REPAIR;  Surgeon: Fuller Canada, MD;  Location: [facility] ORS;  Service: Orthopedics;  Laterality: Right;  Allograft Reconstrucation Right Patella Tendon   RIGHT/LEFT HEART CATH AND CORONARY ANGIOGRAPHY N/A 07/04/2020   Procedure: RIGHT/LEFT HEART CATH AND CORONARY ANGIOGRAPHY;  Surgeon: Kathleene Hazel, MD;  Location: [facility] INVASIVE CV LAB;  Service: Cardiovascular;  Laterality: N/A;   TOTAL HIP ARTHROPLASTY Left 1998   TOTAL KNEE ARTHROPLASTY  05/03/2011   Procedure: TOTAL KNEE ARTHROPLASTY;  Surgeon:  Fuller Canada, MD;  Location: [facility] ORS;  Service: Orthopedics;  Laterality: Right;  With DePuy   TOTAL THYROIDECTOMY  11/2009   Dr. Suszanne Conners @ [facility]   TRANSCATHETER AORTIC VALVE REPLACEMENT, TRANSFEMORAL Right 05/25/2022   Procedure: Transcatheter Aortic Valve Replacement, Transfemoral;  Surgeon: Kathleene Hazel, MD;  Location: [facility] INVASIVE CV LAB;  Service: Open Heart Surgery;  Laterality: Right;   TUBAL LIGATION      Current Outpatient Medications  Medication Sig Dispense Refill    acetaminophen (TYLENOL) 325 MG tablet Take 2 tablets (650 mg total) by mouth every 6 (six) hours as needed for mild pain (or Fever >/= 101).     aluminum-magnesium hydroxide-simethicone (MAALOX) 200-200-20 MG/5ML SUSP Take 30 mLs by mouth 4 (four) times daily as needed (for heartburn).     aspirin EC 81 MG tablet Take 81 mg by mouth daily. Swallow whole.     ibuprofen (ADVIL) 600 MG tablet Take 600 mg by mouth every 6 (six) hours as needed.     levothyroxine (SYNTHROID) 150 MCG tablet Take 150 mcg by mouth daily before breakfast.     nitroGLYCERIN (NITROSTAT) 0.4 MG SL tablet Place 1 tablet (0.4 mg total) under the tongue every 5 (five) minutes as needed for chest pain. 25 tablet 3   ondansetron (ZOFRAN-ODT) 4 MG disintegrating tablet Take 4 mg by mouth every 8 (eight) hours as needed for nausea or vomiting.     oxybutynin (DITROPAN XL) 15 MG 24 hr tablet Take 15 mg by mouth daily.     pantoprazole (PROTONIX) 20 MG tablet Take 20 mg by mouth daily.     PARoxetine (PAXIL) 20 MG tablet Take 20 mg by mouth daily.     polyethylene glycol (MIRALAX / GLYCOLAX) 17 g packet Take 17 g by mouth daily.     rosuvastatin (CRESTOR) 20 MG tablet Take 1 tablet (20 mg total) by mouth daily. 90 tablet 3   furosemide (LASIX) 40 MG tablet Take 1 tablet (40 mg total) by mouth daily. 30 tablet 0   No current facility-administered medications for this visit.    Allergies as of 10/13/2023   (No Known Allergies)    Family History  Problem Relation Age of Onset   Colon cancer Mother    Blindness Sister    Anesthesia problems Neg Hx    Hypotension Neg Hx    Malignant hyperthermia Neg Hx    Pseudochol deficiency Neg Hx     Social History   Socioeconomic History   Marital status: Widowed    Spouse name: Not on file   Number of children: 2   Years of education: Not on file   Highest education level: Not on file  Occupational History   Occupation: Retired-Office work  Tobacco Use   Smoking status:  Former    Current packs/day: 0.00    Types: Cigarettes    Quit date: 09/27/1994    Years since quitting: 29.0   Smokeless tobacco: Never  Vaping Use   Vaping status: Never Used  Substance and Sexual Activity   Alcohol use: No   Drug use: No   Sexual activity: Not Currently  Other Topics Concern   Not on file  Social History Narrative   ** Merged History Encounter **       Social Drivers of Health   Financial Resource Strain: Low Risk  (05/02/2023)   Overall Financial Resource Strain (CARDIA)    Difficulty of Paying Living Expenses: Not very hard  Food Insecurity: No Food  Insecurity (05/27/2023)   Received from [facility]   Hunger Vital Sign    Worried About Running Out of Food in the Last Year: Never true    Ran Out of Food in the Last Year: Never true  Transportation Needs: No Transportation Needs (06/22/2023)   Received from [facility] - Transportation    Lack of Transportation (Medical): No    Lack of Transportation (Non-Medical): No  Physical Activity: Insufficiently Active (05/26/2023)   Received from [facility]   Exercise Vital Sign    Days of Exercise per Week: 4 days    Minutes of Exercise per Session: 30 min  Stress: No Stress Concern Present (05/26/2023)   Received from [facility] - Occupational Stress Questionnaire    Feeling of Stress : Only a little  Social Connections: Moderately Integrated (05/26/2023)   Received from [facility]   Social Connection and Isolation Panel [NHANES]    Frequency of Communication with Friends and Family: Twice a week    Frequency of Social Gatherings with Friends and Family: Twice a week    Attends Religious Services: 1 to 4 times per year    Active Member of [employer] or Organizations: No    Attends Banker Meetings: 1 to 4 times per year    Marital Status: Widowed  Intimate Partner Violence: Not At Risk (03/02/2023)   Humiliation, Afraid, Rape,  and Kick questionnaire    Fear of Current or Ex-Partner: No    Emotionally Abused: No    Physically Abused: No    Sexually Abused: No     Review of Systems   Gen: Denies any fever, chills, fatigue, weight loss, lack of appetite.  CV: Denies chest pain, heart palpitations, peripheral edema, syncope.  Resp: Denies shortness of breath at rest or with exertion. Denies wheezing or cough.  GI: Denies dysphagia or odynophagia. Denies jaundice, hematemesis, fecal incontinence. GU : Denies urinary burning, urinary frequency, urinary hesitancy MS: Denies joint pain, muscle weakness, cramps, or limitation of movement.  Derm: Denies rash, itching, dry skin Psych: Denies depression, anxiety, memory loss, and confusion Heme: Denies bruising, bleeding, and enlarged lymph nodes.   Physical Exam   BP (!) 121/53   Pulse 64   Temp 99.1 F (37.3 C)   Ht 5' 9.5" (1.765 m)   Wt 135 lb 14.4 oz (61.6 kg) Comment: pt reported  BMI 19.78 kg/m  General:   Alert and oriented. Pleasant and cooperative. Well-nourished and well-developed.  Head:  Normocephalic and atraumatic. Eyes:  Without icterus Abdomen:  +BS, soft, non-tender and non-distended. No HSM noted. No guarding or rebound. No masses appreciated.  Rectal:  Deferred  Extremities:  right BKA, in wheelchair Neurologic:  Alert and  oriented x4;  grossly normal neurologically. Skin:  Intact without significant lesions or rashes. Psych:  Alert and cooperative. Normal mood and affect.   Assessment   [name] is a 89 y.o. female presenting today with a history of    PLAN   *****    Gelene Mink, PhD, ANP-BC [facility]

## 2023-10-13 NOTE — Patient Instructions (Signed)
I am glad you are doing well!  Continue pantoprazole once a day and Miralax as needed.  You can take the Zofran as needed every 8 hours!  We will see you back in 6 months!  Have a WONDERFUL birthday!   It was a pleasure to see you today. I want to create trusting relationships with patients and provide genuine, compassionate, and quality care. I truly value your feedback, so please be on the lookout for a survey regarding your visit with me today. I appreciate your time in completing this!         Gelene Mink, PhD, ANP-BC [facility]

## 2023-10-14 DIAGNOSIS — M62562 Muscle wasting and atrophy, not elsewhere classified, left lower leg: Secondary | ICD-10-CM | POA: Diagnosis not present

## 2023-10-14 DIAGNOSIS — M62561 Muscle wasting and atrophy, not elsewhere classified, right lower leg: Secondary | ICD-10-CM | POA: Diagnosis not present

## 2023-10-14 DIAGNOSIS — R293 Abnormal posture: Secondary | ICD-10-CM | POA: Diagnosis not present

## 2023-10-17 DIAGNOSIS — M62562 Muscle wasting and atrophy, not elsewhere classified, left lower leg: Secondary | ICD-10-CM | POA: Diagnosis not present

## 2023-10-17 DIAGNOSIS — M62561 Muscle wasting and atrophy, not elsewhere classified, right lower leg: Secondary | ICD-10-CM | POA: Diagnosis not present

## 2023-10-17 DIAGNOSIS — R293 Abnormal posture: Secondary | ICD-10-CM | POA: Diagnosis not present

## 2023-10-19 DIAGNOSIS — M62561 Muscle wasting and atrophy, not elsewhere classified, right lower leg: Secondary | ICD-10-CM | POA: Diagnosis not present

## 2023-10-19 DIAGNOSIS — I739 Peripheral vascular disease, unspecified: Secondary | ICD-10-CM | POA: Diagnosis not present

## 2023-10-19 DIAGNOSIS — R627 Adult failure to thrive: Secondary | ICD-10-CM | POA: Diagnosis not present

## 2023-10-19 DIAGNOSIS — S88011D Complete traumatic amputation at knee level, right lower leg, subsequent encounter: Secondary | ICD-10-CM | POA: Diagnosis not present

## 2023-10-19 DIAGNOSIS — R293 Abnormal posture: Secondary | ICD-10-CM | POA: Diagnosis not present

## 2023-10-19 DIAGNOSIS — M62562 Muscle wasting and atrophy, not elsewhere classified, left lower leg: Secondary | ICD-10-CM | POA: Diagnosis not present

## 2023-10-20 DIAGNOSIS — M62562 Muscle wasting and atrophy, not elsewhere classified, left lower leg: Secondary | ICD-10-CM | POA: Diagnosis not present

## 2023-10-20 DIAGNOSIS — M62561 Muscle wasting and atrophy, not elsewhere classified, right lower leg: Secondary | ICD-10-CM | POA: Diagnosis not present

## 2023-10-20 DIAGNOSIS — R293 Abnormal posture: Secondary | ICD-10-CM | POA: Diagnosis not present

## 2023-10-21 DIAGNOSIS — M62561 Muscle wasting and atrophy, not elsewhere classified, right lower leg: Secondary | ICD-10-CM | POA: Diagnosis not present

## 2023-10-21 DIAGNOSIS — R293 Abnormal posture: Secondary | ICD-10-CM | POA: Diagnosis not present

## 2023-10-21 DIAGNOSIS — M62562 Muscle wasting and atrophy, not elsewhere classified, left lower leg: Secondary | ICD-10-CM | POA: Diagnosis not present

## 2023-10-24 DIAGNOSIS — R293 Abnormal posture: Secondary | ICD-10-CM | POA: Diagnosis not present

## 2023-10-24 DIAGNOSIS — M62562 Muscle wasting and atrophy, not elsewhere classified, left lower leg: Secondary | ICD-10-CM | POA: Diagnosis not present

## 2023-10-24 DIAGNOSIS — M62561 Muscle wasting and atrophy, not elsewhere classified, right lower leg: Secondary | ICD-10-CM | POA: Diagnosis not present

## 2023-10-26 DIAGNOSIS — R293 Abnormal posture: Secondary | ICD-10-CM | POA: Diagnosis not present

## 2023-10-26 DIAGNOSIS — M62562 Muscle wasting and atrophy, not elsewhere classified, left lower leg: Secondary | ICD-10-CM | POA: Diagnosis not present

## 2023-10-26 DIAGNOSIS — M62561 Muscle wasting and atrophy, not elsewhere classified, right lower leg: Secondary | ICD-10-CM | POA: Diagnosis not present

## 2023-10-27 DIAGNOSIS — M62561 Muscle wasting and atrophy, not elsewhere classified, right lower leg: Secondary | ICD-10-CM | POA: Diagnosis not present

## 2023-10-27 DIAGNOSIS — M62562 Muscle wasting and atrophy, not elsewhere classified, left lower leg: Secondary | ICD-10-CM | POA: Diagnosis not present

## 2023-10-27 DIAGNOSIS — R293 Abnormal posture: Secondary | ICD-10-CM | POA: Diagnosis not present

## 2023-10-31 DIAGNOSIS — M62562 Muscle wasting and atrophy, not elsewhere classified, left lower leg: Secondary | ICD-10-CM | POA: Diagnosis not present

## 2023-10-31 DIAGNOSIS — M62522 Muscle wasting and atrophy, not elsewhere classified, left upper arm: Secondary | ICD-10-CM | POA: Diagnosis not present

## 2023-10-31 DIAGNOSIS — R293 Abnormal posture: Secondary | ICD-10-CM | POA: Diagnosis not present

## 2023-10-31 DIAGNOSIS — M62521 Muscle wasting and atrophy, not elsewhere classified, right upper arm: Secondary | ICD-10-CM | POA: Diagnosis not present

## 2023-11-01 DIAGNOSIS — M62562 Muscle wasting and atrophy, not elsewhere classified, left lower leg: Secondary | ICD-10-CM | POA: Diagnosis not present

## 2023-11-01 DIAGNOSIS — M62521 Muscle wasting and atrophy, not elsewhere classified, right upper arm: Secondary | ICD-10-CM | POA: Diagnosis not present

## 2023-11-01 DIAGNOSIS — M62522 Muscle wasting and atrophy, not elsewhere classified, left upper arm: Secondary | ICD-10-CM | POA: Diagnosis not present

## 2023-11-01 DIAGNOSIS — R293 Abnormal posture: Secondary | ICD-10-CM | POA: Diagnosis not present

## 2023-11-04 DIAGNOSIS — M62562 Muscle wasting and atrophy, not elsewhere classified, left lower leg: Secondary | ICD-10-CM | POA: Diagnosis not present

## 2023-11-04 DIAGNOSIS — R293 Abnormal posture: Secondary | ICD-10-CM | POA: Diagnosis not present

## 2023-11-04 DIAGNOSIS — M62521 Muscle wasting and atrophy, not elsewhere classified, right upper arm: Secondary | ICD-10-CM | POA: Diagnosis not present

## 2023-11-04 DIAGNOSIS — M62522 Muscle wasting and atrophy, not elsewhere classified, left upper arm: Secondary | ICD-10-CM | POA: Diagnosis not present

## 2023-11-07 DIAGNOSIS — M62522 Muscle wasting and atrophy, not elsewhere classified, left upper arm: Secondary | ICD-10-CM | POA: Diagnosis not present

## 2023-11-07 DIAGNOSIS — M62562 Muscle wasting and atrophy, not elsewhere classified, left lower leg: Secondary | ICD-10-CM | POA: Diagnosis not present

## 2023-11-07 DIAGNOSIS — M62521 Muscle wasting and atrophy, not elsewhere classified, right upper arm: Secondary | ICD-10-CM | POA: Diagnosis not present

## 2023-11-07 DIAGNOSIS — R293 Abnormal posture: Secondary | ICD-10-CM | POA: Diagnosis not present

## 2023-11-08 DIAGNOSIS — M62562 Muscle wasting and atrophy, not elsewhere classified, left lower leg: Secondary | ICD-10-CM | POA: Diagnosis not present

## 2023-11-08 DIAGNOSIS — M62521 Muscle wasting and atrophy, not elsewhere classified, right upper arm: Secondary | ICD-10-CM | POA: Diagnosis not present

## 2023-11-08 DIAGNOSIS — M62522 Muscle wasting and atrophy, not elsewhere classified, left upper arm: Secondary | ICD-10-CM | POA: Diagnosis not present

## 2023-11-08 DIAGNOSIS — R293 Abnormal posture: Secondary | ICD-10-CM | POA: Diagnosis not present

## 2023-11-09 DIAGNOSIS — M62522 Muscle wasting and atrophy, not elsewhere classified, left upper arm: Secondary | ICD-10-CM | POA: Diagnosis not present

## 2023-11-09 DIAGNOSIS — R293 Abnormal posture: Secondary | ICD-10-CM | POA: Diagnosis not present

## 2023-11-09 DIAGNOSIS — M62521 Muscle wasting and atrophy, not elsewhere classified, right upper arm: Secondary | ICD-10-CM | POA: Diagnosis not present

## 2023-11-09 DIAGNOSIS — M62562 Muscle wasting and atrophy, not elsewhere classified, left lower leg: Secondary | ICD-10-CM | POA: Diagnosis not present

## 2023-11-15 DIAGNOSIS — H524 Presbyopia: Secondary | ICD-10-CM | POA: Diagnosis not present

## 2023-11-15 DIAGNOSIS — H43813 Vitreous degeneration, bilateral: Secondary | ICD-10-CM | POA: Diagnosis not present

## 2023-11-15 DIAGNOSIS — Z961 Presence of intraocular lens: Secondary | ICD-10-CM | POA: Diagnosis not present

## 2023-11-20 DIAGNOSIS — I739 Peripheral vascular disease, unspecified: Secondary | ICD-10-CM | POA: Diagnosis not present

## 2023-11-20 DIAGNOSIS — R627 Adult failure to thrive: Secondary | ICD-10-CM | POA: Diagnosis not present

## 2023-11-20 DIAGNOSIS — S88011D Complete traumatic amputation at knee level, right lower leg, subsequent encounter: Secondary | ICD-10-CM | POA: Diagnosis not present

## 2023-11-30 DIAGNOSIS — M6281 Muscle weakness (generalized): Secondary | ICD-10-CM | POA: Diagnosis not present

## 2023-11-30 DIAGNOSIS — I5032 Chronic diastolic (congestive) heart failure: Secondary | ICD-10-CM | POA: Diagnosis not present

## 2023-11-30 DIAGNOSIS — K922 Gastrointestinal hemorrhage, unspecified: Secondary | ICD-10-CM | POA: Diagnosis not present

## 2023-11-30 DIAGNOSIS — R112 Nausea with vomiting, unspecified: Secondary | ICD-10-CM | POA: Diagnosis not present

## 2023-11-30 DIAGNOSIS — J449 Chronic obstructive pulmonary disease, unspecified: Secondary | ICD-10-CM | POA: Diagnosis not present

## 2023-11-30 DIAGNOSIS — M62522 Muscle wasting and atrophy, not elsewhere classified, left upper arm: Secondary | ICD-10-CM | POA: Diagnosis not present

## 2023-11-30 DIAGNOSIS — R911 Solitary pulmonary nodule: Secondary | ICD-10-CM | POA: Diagnosis not present

## 2023-11-30 DIAGNOSIS — J90 Pleural effusion, not elsewhere classified: Secondary | ICD-10-CM | POA: Diagnosis not present

## 2023-11-30 DIAGNOSIS — Z89611 Acquired absence of right leg above knee: Secondary | ICD-10-CM | POA: Diagnosis not present

## 2023-11-30 DIAGNOSIS — I739 Peripheral vascular disease, unspecified: Secondary | ICD-10-CM | POA: Diagnosis not present

## 2023-11-30 DIAGNOSIS — I509 Heart failure, unspecified: Secondary | ICD-10-CM | POA: Diagnosis not present

## 2023-11-30 DIAGNOSIS — N3281 Overactive bladder: Secondary | ICD-10-CM | POA: Diagnosis not present

## 2023-11-30 DIAGNOSIS — Z743 Need for continuous supervision: Secondary | ICD-10-CM | POA: Diagnosis not present

## 2023-11-30 DIAGNOSIS — K449 Diaphragmatic hernia without obstruction or gangrene: Secondary | ICD-10-CM | POA: Diagnosis not present

## 2023-11-30 DIAGNOSIS — R0902 Hypoxemia: Secondary | ICD-10-CM | POA: Diagnosis not present

## 2023-11-30 DIAGNOSIS — K920 Hematemesis: Secondary | ICD-10-CM | POA: Diagnosis not present

## 2023-11-30 DIAGNOSIS — I2510 Atherosclerotic heart disease of native coronary artery without angina pectoris: Secondary | ICD-10-CM | POA: Diagnosis not present

## 2023-11-30 DIAGNOSIS — E876 Hypokalemia: Secondary | ICD-10-CM | POA: Diagnosis not present

## 2023-11-30 DIAGNOSIS — D649 Anemia, unspecified: Secondary | ICD-10-CM | POA: Diagnosis not present

## 2023-11-30 DIAGNOSIS — J9811 Atelectasis: Secondary | ICD-10-CM | POA: Diagnosis not present

## 2023-11-30 DIAGNOSIS — R079 Chest pain, unspecified: Secondary | ICD-10-CM | POA: Diagnosis not present

## 2023-11-30 DIAGNOSIS — E039 Hypothyroidism, unspecified: Secondary | ICD-10-CM | POA: Diagnosis not present

## 2023-11-30 DIAGNOSIS — Z7901 Long term (current) use of anticoagulants: Secondary | ICD-10-CM | POA: Diagnosis not present

## 2023-11-30 DIAGNOSIS — Z66 Do not resuscitate: Secondary | ICD-10-CM | POA: Diagnosis not present

## 2023-11-30 DIAGNOSIS — J690 Pneumonitis due to inhalation of food and vomit: Secondary | ICD-10-CM | POA: Diagnosis not present

## 2023-11-30 DIAGNOSIS — K219 Gastro-esophageal reflux disease without esophagitis: Secondary | ICD-10-CM | POA: Diagnosis not present

## 2023-11-30 DIAGNOSIS — R1111 Vomiting without nausea: Secondary | ICD-10-CM | POA: Diagnosis not present

## 2023-11-30 DIAGNOSIS — M62521 Muscle wasting and atrophy, not elsewhere classified, right upper arm: Secondary | ICD-10-CM | POA: Diagnosis not present

## 2023-11-30 DIAGNOSIS — Z952 Presence of prosthetic heart valve: Secondary | ICD-10-CM | POA: Diagnosis not present

## 2023-11-30 DIAGNOSIS — I350 Nonrheumatic aortic (valve) stenosis: Secondary | ICD-10-CM | POA: Diagnosis not present

## 2023-11-30 DIAGNOSIS — E785 Hyperlipidemia, unspecified: Secondary | ICD-10-CM | POA: Diagnosis not present

## 2023-11-30 DIAGNOSIS — M1990 Unspecified osteoarthritis, unspecified site: Secondary | ICD-10-CM | POA: Diagnosis not present

## 2023-11-30 DIAGNOSIS — R54 Age-related physical debility: Secondary | ICD-10-CM | POA: Diagnosis not present

## 2023-11-30 DIAGNOSIS — A419 Sepsis, unspecified organism: Secondary | ICD-10-CM | POA: Diagnosis not present

## 2023-11-30 DIAGNOSIS — R7989 Other specified abnormal findings of blood chemistry: Secondary | ICD-10-CM | POA: Diagnosis not present

## 2023-11-30 DIAGNOSIS — N189 Chronic kidney disease, unspecified: Secondary | ICD-10-CM | POA: Diagnosis not present

## 2023-11-30 DIAGNOSIS — R531 Weakness: Secondary | ICD-10-CM | POA: Diagnosis not present

## 2023-11-30 DIAGNOSIS — Z4781 Encounter for orthopedic aftercare following surgical amputation: Secondary | ICD-10-CM | POA: Diagnosis not present

## 2023-11-30 DIAGNOSIS — Z1152 Encounter for screening for COVID-19: Secondary | ICD-10-CM | POA: Diagnosis not present

## 2023-11-30 DIAGNOSIS — N390 Urinary tract infection, site not specified: Secondary | ICD-10-CM | POA: Diagnosis not present

## 2023-11-30 DIAGNOSIS — I499 Cardiac arrhythmia, unspecified: Secondary | ICD-10-CM | POA: Diagnosis not present

## 2023-11-30 DIAGNOSIS — R2689 Other abnormalities of gait and mobility: Secondary | ICD-10-CM | POA: Diagnosis not present

## 2023-11-30 DIAGNOSIS — E860 Dehydration: Secondary | ICD-10-CM | POA: Diagnosis not present

## 2023-11-30 DIAGNOSIS — R6889 Other general symptoms and signs: Secondary | ICD-10-CM | POA: Diagnosis not present

## 2023-11-30 DIAGNOSIS — D638 Anemia in other chronic diseases classified elsewhere: Secondary | ICD-10-CM | POA: Diagnosis not present

## 2023-12-05 DIAGNOSIS — Z4781 Encounter for orthopedic aftercare following surgical amputation: Secondary | ICD-10-CM | POA: Diagnosis not present

## 2023-12-05 DIAGNOSIS — N3281 Overactive bladder: Secondary | ICD-10-CM | POA: Diagnosis not present

## 2023-12-05 DIAGNOSIS — R627 Adult failure to thrive: Secondary | ICD-10-CM | POA: Diagnosis not present

## 2023-12-05 DIAGNOSIS — J690 Pneumonitis due to inhalation of food and vomit: Secondary | ICD-10-CM | POA: Diagnosis not present

## 2023-12-05 DIAGNOSIS — M6281 Muscle weakness (generalized): Secondary | ICD-10-CM | POA: Diagnosis not present

## 2023-12-05 DIAGNOSIS — I2510 Atherosclerotic heart disease of native coronary artery without angina pectoris: Secondary | ICD-10-CM | POA: Diagnosis not present

## 2023-12-05 DIAGNOSIS — D649 Anemia, unspecified: Secondary | ICD-10-CM | POA: Diagnosis not present

## 2023-12-05 DIAGNOSIS — K920 Hematemesis: Secondary | ICD-10-CM | POA: Diagnosis not present

## 2023-12-05 DIAGNOSIS — L89621 Pressure ulcer of left heel, stage 1: Secondary | ICD-10-CM | POA: Diagnosis not present

## 2023-12-05 DIAGNOSIS — M1990 Unspecified osteoarthritis, unspecified site: Secondary | ICD-10-CM | POA: Diagnosis not present

## 2023-12-05 DIAGNOSIS — M79632 Pain in left forearm: Secondary | ICD-10-CM | POA: Diagnosis not present

## 2023-12-05 DIAGNOSIS — L84 Corns and callosities: Secondary | ICD-10-CM | POA: Diagnosis not present

## 2023-12-05 DIAGNOSIS — Z952 Presence of prosthetic heart valve: Secondary | ICD-10-CM | POA: Diagnosis not present

## 2023-12-05 DIAGNOSIS — E039 Hypothyroidism, unspecified: Secondary | ICD-10-CM | POA: Diagnosis not present

## 2023-12-05 DIAGNOSIS — M62522 Muscle wasting and atrophy, not elsewhere classified, left upper arm: Secondary | ICD-10-CM | POA: Diagnosis not present

## 2023-12-05 DIAGNOSIS — I739 Peripheral vascular disease, unspecified: Secondary | ICD-10-CM | POA: Diagnosis not present

## 2023-12-05 DIAGNOSIS — M79642 Pain in left hand: Secondary | ICD-10-CM | POA: Diagnosis not present

## 2023-12-05 DIAGNOSIS — E876 Hypokalemia: Secondary | ICD-10-CM | POA: Diagnosis not present

## 2023-12-05 DIAGNOSIS — L602 Onychogryphosis: Secondary | ICD-10-CM | POA: Diagnosis not present

## 2023-12-05 DIAGNOSIS — S88011D Complete traumatic amputation at knee level, right lower leg, subsequent encounter: Secondary | ICD-10-CM | POA: Diagnosis not present

## 2023-12-05 DIAGNOSIS — K449 Diaphragmatic hernia without obstruction or gangrene: Secondary | ICD-10-CM | POA: Diagnosis not present

## 2023-12-05 DIAGNOSIS — R293 Abnormal posture: Secondary | ICD-10-CM | POA: Diagnosis not present

## 2023-12-05 DIAGNOSIS — R7989 Other specified abnormal findings of blood chemistry: Secondary | ICD-10-CM | POA: Diagnosis not present

## 2023-12-05 DIAGNOSIS — E785 Hyperlipidemia, unspecified: Secondary | ICD-10-CM | POA: Diagnosis not present

## 2023-12-05 DIAGNOSIS — J449 Chronic obstructive pulmonary disease, unspecified: Secondary | ICD-10-CM | POA: Diagnosis not present

## 2023-12-05 DIAGNOSIS — I509 Heart failure, unspecified: Secondary | ICD-10-CM | POA: Diagnosis not present

## 2023-12-05 DIAGNOSIS — I5032 Chronic diastolic (congestive) heart failure: Secondary | ICD-10-CM | POA: Diagnosis not present

## 2023-12-05 DIAGNOSIS — M62562 Muscle wasting and atrophy, not elsewhere classified, left lower leg: Secondary | ICD-10-CM | POA: Diagnosis not present

## 2023-12-05 DIAGNOSIS — L603 Nail dystrophy: Secondary | ICD-10-CM | POA: Diagnosis not present

## 2023-12-05 DIAGNOSIS — R2689 Other abnormalities of gait and mobility: Secondary | ICD-10-CM | POA: Diagnosis not present

## 2023-12-05 DIAGNOSIS — M25532 Pain in left wrist: Secondary | ICD-10-CM | POA: Diagnosis not present

## 2023-12-05 DIAGNOSIS — D638 Anemia in other chronic diseases classified elsewhere: Secondary | ICD-10-CM | POA: Diagnosis not present

## 2023-12-05 DIAGNOSIS — I350 Nonrheumatic aortic (valve) stenosis: Secondary | ICD-10-CM | POA: Diagnosis not present

## 2023-12-05 DIAGNOSIS — Z89611 Acquired absence of right leg above knee: Secondary | ICD-10-CM | POA: Diagnosis not present

## 2023-12-05 DIAGNOSIS — M62521 Muscle wasting and atrophy, not elsewhere classified, right upper arm: Secondary | ICD-10-CM | POA: Diagnosis not present

## 2023-12-05 DIAGNOSIS — R0902 Hypoxemia: Secondary | ICD-10-CM | POA: Diagnosis not present

## 2023-12-07 DIAGNOSIS — M79642 Pain in left hand: Secondary | ICD-10-CM | POA: Diagnosis not present

## 2023-12-07 DIAGNOSIS — M79632 Pain in left forearm: Secondary | ICD-10-CM | POA: Diagnosis not present

## 2023-12-07 DIAGNOSIS — M25532 Pain in left wrist: Secondary | ICD-10-CM | POA: Diagnosis not present

## 2023-12-19 DIAGNOSIS — I739 Peripheral vascular disease, unspecified: Secondary | ICD-10-CM | POA: Diagnosis not present

## 2023-12-19 DIAGNOSIS — S88011D Complete traumatic amputation at knee level, right lower leg, subsequent encounter: Secondary | ICD-10-CM | POA: Diagnosis not present

## 2023-12-19 DIAGNOSIS — R627 Adult failure to thrive: Secondary | ICD-10-CM | POA: Diagnosis not present

## 2024-01-06 DIAGNOSIS — L89621 Pressure ulcer of left heel, stage 1: Secondary | ICD-10-CM | POA: Diagnosis not present

## 2024-01-06 DIAGNOSIS — I739 Peripheral vascular disease, unspecified: Secondary | ICD-10-CM | POA: Diagnosis not present

## 2024-01-06 DIAGNOSIS — L603 Nail dystrophy: Secondary | ICD-10-CM | POA: Diagnosis not present

## 2024-01-06 DIAGNOSIS — L602 Onychogryphosis: Secondary | ICD-10-CM | POA: Diagnosis not present

## 2024-01-06 DIAGNOSIS — L84 Corns and callosities: Secondary | ICD-10-CM | POA: Diagnosis not present

## 2024-01-19 DIAGNOSIS — R627 Adult failure to thrive: Secondary | ICD-10-CM | POA: Diagnosis not present

## 2024-01-19 DIAGNOSIS — I739 Peripheral vascular disease, unspecified: Secondary | ICD-10-CM | POA: Diagnosis not present

## 2024-01-19 DIAGNOSIS — S88011D Complete traumatic amputation at knee level, right lower leg, subsequent encounter: Secondary | ICD-10-CM | POA: Diagnosis not present

## 2024-02-16 DIAGNOSIS — R627 Adult failure to thrive: Secondary | ICD-10-CM | POA: Diagnosis not present

## 2024-02-16 DIAGNOSIS — I739 Peripheral vascular disease, unspecified: Secondary | ICD-10-CM | POA: Diagnosis not present

## 2024-02-16 DIAGNOSIS — S88011D Complete traumatic amputation at knee level, right lower leg, subsequent encounter: Secondary | ICD-10-CM | POA: Diagnosis not present

## 2024-02-23 ENCOUNTER — Encounter: Payer: Self-pay | Admitting: *Deleted

## 2024-02-29 ENCOUNTER — Ambulatory Visit: Payer: 59 | Admitting: Cardiology

## 2024-02-29 ENCOUNTER — Ambulatory Visit: Admitting: Cardiology

## 2024-02-29 ENCOUNTER — Encounter: Payer: Self-pay | Admitting: Cardiology

## 2024-02-29 ENCOUNTER — Ambulatory Visit: Attending: Cardiology | Admitting: Cardiology

## 2024-02-29 VITALS — BP 122/62 | HR 52 | Ht 69.5 in | Wt 139.0 lb

## 2024-02-29 DIAGNOSIS — I447 Left bundle-branch block, unspecified: Secondary | ICD-10-CM | POA: Diagnosis not present

## 2024-02-29 DIAGNOSIS — I480 Paroxysmal atrial fibrillation: Secondary | ICD-10-CM | POA: Diagnosis not present

## 2024-02-29 DIAGNOSIS — Z79899 Other long term (current) drug therapy: Secondary | ICD-10-CM

## 2024-02-29 DIAGNOSIS — Z952 Presence of prosthetic heart valve: Secondary | ICD-10-CM

## 2024-02-29 DIAGNOSIS — E782 Mixed hyperlipidemia: Secondary | ICD-10-CM

## 2024-02-29 NOTE — Patient Instructions (Addendum)
 Medication Instructions:  Your physician recommends that you continue on your current medications as directed. Please refer to the Current Medication list given to you today.  Labwork: Your physician recommends that you return for a FASTING lipid profile as soon as possible. Please do not eat or drink for at least 8 hours when you have this done. You may take your medications that morning with a sip of water.  Testing/Procedures: Your physician has requested that you have an echocardiogram in 6 months just before your next visit. Echocardiography is a painless test that uses sound waves to create images of your heart. It provides your doctor with information about the size and shape of your heart and how well your heart's chambers and valves are working. This procedure takes approximately one hour. There are no restrictions for this procedure. Please do NOT wear cologne, perfume, aftershave, or lotions (deodorant is allowed). Please arrive 15 minutes prior to your appointment time.  Please note: We ask at that you not bring children with you during ultrasound (echo/ vascular) testing. Due to room size and safety concerns, children are not allowed in the ultrasound rooms during exams. Our front office staff cannot provide observation of children in our lobby area while testing is being conducted. An adult accompanying a patient to their appointment will only be allowed in the ultrasound room at the discretion of the ultrasound technician under special circumstances. We apologize for any inconvenience.  Follow-Up: Your physician recommends that you schedule a follow-up appointment in: 6 months  Any Other Special Instructions Will Be Listed Below (If Applicable).  If you need a refill on your cardiac medications before your next appointment, please call your pharmacy.

## 2024-02-29 NOTE — Progress Notes (Signed)
    Cardiology Office Note  Date: 02/29/2024   ID: [name], DOB 1932-11-03, MRN [id number]  History of Present Illness: [name] is an 89 y.o. female last seen in November 2024.  She is here for a follow-up visit.  Still resides at the [facility] nursing center.  She does not report any chest pain or breathlessness with low-level activity.  Has undergone PT following her right AKA in June of last year, is in a wheelchair, no prosthesis as yet.  We went over her medications.  She reports compliance with current regimen.  No interval nitroglycerin  use.  Tolerating Crestor  20 mg daily, we will plan to recheck FLP.  She is due for a follow-up echocardiogram in about 6 months.  Physical Exam: VS:  BP 122/62 (BP Location: Right Arm)   Pulse (!) 52   Ht 5' 9.5" (1.765 m)   Wt 139 lb (63 kg)   SpO2 96%   BMI 20.23 kg/m , BMI Body mass index is 20.23 kg/m.  Wt Readings from Last 3 Encounters:  02/29/24 139 lb (63 kg)  10/13/23 135 lb 14.4 oz (61.6 kg)  08/19/23 147 lb 9.6 oz (67 kg)    General: Patient appears comfortable at rest. HEENT: Conjunctiva and lids normal. Neck: Supple, no elevated JVP or carotid bruits. Lungs: Clear to auscultation, nonlabored breathing at rest. Cardiac: Regular rate and rhythm, no S3, 2/6 systolic murmur. Extremities: Status post right AKA.  ECG:  An ECG dated March 2025 was personally reviewed today and demonstrated:  Sinus rhythm with left bundle branch block and frequent PACs.  Labwork: 03/02/2023: ALT 17; AST 29; Magnesium  1.8 03/20/2023: BUN 31; Creatinine, Ser 1.04; Hemoglobin 8.7; Platelets 357; Potassium 3.7; Sodium 135  November 2024: Cholesterol 204, triglycerides 130, HDL 44, LDL 134 March 2025: Hemoglobin 11.2, platelets 206, potassium 3.9, BUN 15, creatinine 0.59, GFR 86  Other Studies Reviewed Today:  No interval cardiac testing for review today.  Assessment and Plan:  1.  History of severe aortic stenosis  status post TAVR in August 2023, 26 mm Edwards SAPIEN 3 THV.  Follow-up echocardiogram in August 2024 revealed mean AV gradient 12 mmHg and no aortic regurgitation.  She reports NYHA class II dyspnea at low level activity.  Continue aspirin  81 mg daily, follow-up echocardiogram in 6 months.   2.  Paroxysmal atrial fibrillation, asymptomatic.  CHA2DS2-VASc score is 4.  She is not anticoagulated given concern for increased bleeding risk and falls.   3.  Left bundle branch block following TAVR, asymptomatic and with cardiac monitor from October 2023 showing no significant pauses or heart block.  She is currently not on any AV nodal blockers.   4.  Status post right AKA in June 2024 following cellulitis/osteomyelitis.   5.  Mixed hyperlipidemia, LDL 134 in November 2024.  Tolerating Crestor  20 mg daily at this point.  Recheck FLP.  Disposition:  Follow up 6 months.  Signed, Gerard Knight, M.D., F.A.C.C. [facility]

## 2024-03-01 ENCOUNTER — Telehealth: Payer: Self-pay

## 2024-03-01 ENCOUNTER — Ambulatory Visit: Payer: Self-pay | Admitting: Cardiology

## 2024-03-01 DIAGNOSIS — E785 Hyperlipidemia, unspecified: Secondary | ICD-10-CM | POA: Diagnosis not present

## 2024-03-01 NOTE — Transitions of Care (Post Inpatient/ED Visit) (Signed)
   03/01/2024  Name: [name] MRN: [id number] DOB: [birth year]  Today's TOC FU Call Status: Today's TOC FU Call Status:: Successful TOC FU Call Completed TOC FU Call Complete Date: 03/01/24 (confirmed that patient had no hospitalization - TOC not indicated) Patient's Name and Date of Birth confirmed.   [name] RN, CCM [place]  VBCI-Population Health RN Care Manager [phone]

## 2024-03-17 DIAGNOSIS — I739 Peripheral vascular disease, unspecified: Secondary | ICD-10-CM | POA: Diagnosis not present

## 2024-03-17 DIAGNOSIS — S88011D Complete traumatic amputation at knee level, right lower leg, subsequent encounter: Secondary | ICD-10-CM | POA: Diagnosis not present

## 2024-03-17 DIAGNOSIS — R627 Adult failure to thrive: Secondary | ICD-10-CM | POA: Diagnosis not present

## 2024-03-23 DIAGNOSIS — M62562 Muscle wasting and atrophy, not elsewhere classified, left lower leg: Secondary | ICD-10-CM | POA: Diagnosis not present

## 2024-03-23 DIAGNOSIS — M62561 Muscle wasting and atrophy, not elsewhere classified, right lower leg: Secondary | ICD-10-CM | POA: Diagnosis not present

## 2024-03-23 DIAGNOSIS — R2689 Other abnormalities of gait and mobility: Secondary | ICD-10-CM | POA: Diagnosis not present

## 2024-03-27 DIAGNOSIS — R2689 Other abnormalities of gait and mobility: Secondary | ICD-10-CM | POA: Diagnosis not present

## 2024-03-27 DIAGNOSIS — M62562 Muscle wasting and atrophy, not elsewhere classified, left lower leg: Secondary | ICD-10-CM | POA: Diagnosis not present

## 2024-03-27 DIAGNOSIS — M62561 Muscle wasting and atrophy, not elsewhere classified, right lower leg: Secondary | ICD-10-CM | POA: Diagnosis not present

## 2024-03-28 DIAGNOSIS — R2689 Other abnormalities of gait and mobility: Secondary | ICD-10-CM | POA: Diagnosis not present

## 2024-03-28 DIAGNOSIS — M62562 Muscle wasting and atrophy, not elsewhere classified, left lower leg: Secondary | ICD-10-CM | POA: Diagnosis not present

## 2024-03-28 DIAGNOSIS — M62561 Muscle wasting and atrophy, not elsewhere classified, right lower leg: Secondary | ICD-10-CM | POA: Diagnosis not present

## 2024-03-29 DIAGNOSIS — M62561 Muscle wasting and atrophy, not elsewhere classified, right lower leg: Secondary | ICD-10-CM | POA: Diagnosis not present

## 2024-03-29 DIAGNOSIS — M62562 Muscle wasting and atrophy, not elsewhere classified, left lower leg: Secondary | ICD-10-CM | POA: Diagnosis not present

## 2024-03-29 DIAGNOSIS — R2689 Other abnormalities of gait and mobility: Secondary | ICD-10-CM | POA: Diagnosis not present

## 2024-03-30 DIAGNOSIS — M62562 Muscle wasting and atrophy, not elsewhere classified, left lower leg: Secondary | ICD-10-CM | POA: Diagnosis not present

## 2024-03-30 DIAGNOSIS — M62561 Muscle wasting and atrophy, not elsewhere classified, right lower leg: Secondary | ICD-10-CM | POA: Diagnosis not present

## 2024-03-30 DIAGNOSIS — R2689 Other abnormalities of gait and mobility: Secondary | ICD-10-CM | POA: Diagnosis not present

## 2024-03-31 DIAGNOSIS — R2689 Other abnormalities of gait and mobility: Secondary | ICD-10-CM | POA: Diagnosis not present

## 2024-03-31 DIAGNOSIS — M62561 Muscle wasting and atrophy, not elsewhere classified, right lower leg: Secondary | ICD-10-CM | POA: Diagnosis not present

## 2024-03-31 DIAGNOSIS — M62562 Muscle wasting and atrophy, not elsewhere classified, left lower leg: Secondary | ICD-10-CM | POA: Diagnosis not present

## 2024-04-02 DIAGNOSIS — M62562 Muscle wasting and atrophy, not elsewhere classified, left lower leg: Secondary | ICD-10-CM | POA: Diagnosis not present

## 2024-04-02 DIAGNOSIS — R2689 Other abnormalities of gait and mobility: Secondary | ICD-10-CM | POA: Diagnosis not present

## 2024-04-02 DIAGNOSIS — M62561 Muscle wasting and atrophy, not elsewhere classified, right lower leg: Secondary | ICD-10-CM | POA: Diagnosis not present

## 2024-04-03 DIAGNOSIS — R2689 Other abnormalities of gait and mobility: Secondary | ICD-10-CM | POA: Diagnosis not present

## 2024-04-03 DIAGNOSIS — M62561 Muscle wasting and atrophy, not elsewhere classified, right lower leg: Secondary | ICD-10-CM | POA: Diagnosis not present

## 2024-04-03 DIAGNOSIS — M62562 Muscle wasting and atrophy, not elsewhere classified, left lower leg: Secondary | ICD-10-CM | POA: Diagnosis not present

## 2024-04-04 DIAGNOSIS — M62562 Muscle wasting and atrophy, not elsewhere classified, left lower leg: Secondary | ICD-10-CM | POA: Diagnosis not present

## 2024-04-04 DIAGNOSIS — R2689 Other abnormalities of gait and mobility: Secondary | ICD-10-CM | POA: Diagnosis not present

## 2024-04-04 DIAGNOSIS — M62561 Muscle wasting and atrophy, not elsewhere classified, right lower leg: Secondary | ICD-10-CM | POA: Diagnosis not present

## 2024-04-05 ENCOUNTER — Ambulatory Visit (INDEPENDENT_AMBULATORY_CARE_PROVIDER_SITE_OTHER): Payer: 59 | Admitting: Gastroenterology

## 2024-04-05 ENCOUNTER — Encounter: Payer: Self-pay | Admitting: Gastroenterology

## 2024-04-05 VITALS — BP 119/68 | HR 58 | Temp 98.3°F | Ht 69.5 in | Wt 140.0 lb

## 2024-04-05 DIAGNOSIS — K5900 Constipation, unspecified: Secondary | ICD-10-CM | POA: Diagnosis not present

## 2024-04-05 DIAGNOSIS — K219 Gastro-esophageal reflux disease without esophagitis: Secondary | ICD-10-CM | POA: Diagnosis not present

## 2024-04-05 DIAGNOSIS — K449 Diaphragmatic hernia without obstruction or gangrene: Secondary | ICD-10-CM

## 2024-04-05 DIAGNOSIS — R2689 Other abnormalities of gait and mobility: Secondary | ICD-10-CM | POA: Diagnosis not present

## 2024-04-05 DIAGNOSIS — M62561 Muscle wasting and atrophy, not elsewhere classified, right lower leg: Secondary | ICD-10-CM | POA: Diagnosis not present

## 2024-04-05 DIAGNOSIS — M62562 Muscle wasting and atrophy, not elsewhere classified, left lower leg: Secondary | ICD-10-CM | POA: Diagnosis not present

## 2024-04-05 MED ORDER — PANTOPRAZOLE SODIUM 40 MG PO TBEC: 40.0000 mg | DELAYED_RELEASE_TABLET | Freq: Every day | ORAL | 3 refills | Status: DC

## 2024-04-05 NOTE — Patient Instructions (Signed)
 I am increasing pantoprazole  to 40 milligrams once each morning, 30 minutes before eating.   We can stop the carafate tablets. Please let us  know if you have any recurrent symptoms, any blood in your stool, abdominal pain, or vomiting blood.   You can continue Zofran  as needed for nausea.   We can see you in 4-6 months!  I enjoyed seeing you again today! I value our relationship and want to provide genuine, compassionate, and quality care. You may receive a survey regarding your visit with me, and I welcome your feedback! Thanks so much for taking the time to complete this. I look forward to seeing you again.      Therisa MICAEL Stager, PhD, ANP-BC [facility]

## 2024-04-05 NOTE — Progress Notes (Signed)
 Gastroenterology Office Note     Primary Care Physician:  Orpha Yancey LABOR, MD  Primary Gastroenterologist: Dr. Cindie    Chief Complaint   Chief Complaint  Patient presents with   Follow-up    Pt arrives for follow up. Pt states a few months ago she began vomiting after eating. Pt has been on Zofran  and that has been helping.      History of Present Illness   [name] is an 89 y.o. female presenting today with a history of GERD, epigastric pain, large hiatal hernia, constipation, last seen in Jan 2025 by myself and doing well. She returns today in follow-up.    A few months ago was vomiting after eating. Presented to [facility] in March 2025 with reported coffee ground emesis, but was heme negative. Conservative care at that time. Placed on Zofran  and had improvement. Has been doing well since on Zofran . Denies overt GI bleeding. CT chest on March 2025 large hiatal hernia. Taking Carafate QID. Pantoprazole  20 mg currently and carafate. Zofran  prn. Has good appetite. No further nausea. No abdominal pain. If laying down, will feel like throwing up. No dysphagia. She desires to hold off on any EGD at this time.   Constipation: was taking Miralax  daily but now just as needed.    Past Medical History:  Diagnosis Date   Anxiety    Arthritis    CAD (coronary artery disease)    Mild at cardiac catheterization 2018   Chronic bronchitis    Constipation    Depression    Dysrhythmia    afib 03/04/23   Hyperlipidemia    Hypokalemia 02/12/2017   Hypothyroidism    S/P TAVR (transcatheter aortic valve replacement) 05/25/2022   26mm S3UR via TF approach with Dr. Verlin and Dr. Lucas   Severe aortic stenosis    Wound of left leg 01/07/2023    Past Surgical History:  Procedure Laterality Date   AMPUTATION Right 03/18/2023   Procedure: RIGHT ABOVE KNEE AMPUTATION;  Surgeon: Harden Jerona GAILS, MD;  Location: [facility] OR;  Service: Orthopedics;  Laterality: Right;   APPLICATION OF  WOUND VAC Right 03/18/2023   Procedure: APPLICATION OF WOUND VAC;  Surgeon: Harden Jerona GAILS, MD;  Location: [facility] OR;  Service: Orthopedics;  Laterality: Right;   BUNIONECTOMY Left 1975   CARDIAC CATHETERIZATION  07/04/2020   CHOLECYSTECTOMY N/A 02/15/2017   Procedure: LAPAROSCOPIC CHOLECYSTECTOMY;  Surgeon: Rubin Calamity, MD;  Location: [facility] OR;  Service: General;  Laterality: N/A;   CORONARY ANGIOGRAPHY N/A 02/14/2017   Procedure: Coronary Angiography;  Surgeon: Mady Bruckner, MD;  Location: [facility] INVASIVE CV LAB;  Service: Cardiovascular;  Laterality: N/A;   EYE SURGERY  2009   bilateral   HEMATOMA EVACUATION  06/04/2011   Procedure: EVACUATION HEMATOMA;  Surgeon: Taft Minerva, MD;  Location: [facility] ORS;  Service: Orthopedics;  Laterality: Right;  evacuation of seroma   INTRAOPERATIVE TRANSTHORACIC ECHOCARDIOGRAM N/A 05/25/2022   Procedure: INTRAOPERATIVE TRANSTHORACIC ECHOCARDIOGRAM;  Surgeon: Verlin Bruckner BIRCH, MD;  Location: [facility] INVASIVE CV LAB;  Service: Open Heart Surgery;  Laterality: N/A;   JOINT REPLACEMENT  05/2010   left total knee Dr. Minerva   PATELLAR TENDON REPAIR  06/04/2011   Procedure: PATELLA TENDON REPAIR;  Surgeon: Taft Minerva, MD;  Location: [facility] ORS;  Service: Orthopedics;  Laterality: Right;   PATELLAR TENDON REPAIR  08/30/2011   Procedure: PATELLA TENDON REPAIR;  Surgeon: Taft Minerva, MD;  Location: [facility] ORS;  Service: Orthopedics;  Laterality: Right;  Allograft Reconstrucation  Right Patella Tendon   RIGHT/LEFT HEART CATH AND CORONARY ANGIOGRAPHY N/A 07/04/2020   Procedure: RIGHT/LEFT HEART CATH AND CORONARY ANGIOGRAPHY;  Surgeon: Verlin Lonni BIRCH, MD;  Location: [facility] INVASIVE CV LAB;  Service: Cardiovascular;  Laterality: N/A;   TOTAL HIP ARTHROPLASTY Left 1998   TOTAL KNEE ARTHROPLASTY  05/03/2011   Procedure: TOTAL KNEE ARTHROPLASTY;  Surgeon: Taft Minerva, MD;  Location: [facility] ORS;  Service: Orthopedics;  Laterality: Right;  With DePuy   TOTAL  THYROIDECTOMY  11/2009   Dr. Karis @ [facility]   TRANSCATHETER AORTIC VALVE REPLACEMENT, TRANSFEMORAL Right 05/25/2022   Procedure: Transcatheter Aortic Valve Replacement, Transfemoral;  Surgeon: Verlin Lonni BIRCH, MD;  Location: [facility] INVASIVE CV LAB;  Service: Open Heart Surgery;  Laterality: Right;   TUBAL LIGATION      Current Outpatient Medications  Medication Sig Dispense Refill   acetaminophen  (TYLENOL ) 325 MG tablet Take 2 tablets (650 mg total) by mouth every 6 (six) hours as needed for mild pain (or Fever >/= 101).     aluminum -magnesium  hydroxide-simethicone (MAALOX) 200-200-20 MG/5ML SUSP Take 30 mLs by mouth 4 (four) times daily as needed (for heartburn).     aspirin  EC 81 MG tablet Take 81 mg by mouth daily. Swallow whole.     furosemide  (LASIX ) 40 MG tablet Take 1 tablet (40 mg total) by mouth daily. 30 tablet 0   levothyroxine  (SYNTHROID ) 150 MCG tablet Take 150 mcg by mouth daily before breakfast.     nitroGLYCERIN  (NITROSTAT ) 0.4 MG SL tablet Place 1 tablet (0.4 mg total) under the tongue every 5 (five) minutes as needed for chest pain. 25 tablet 3   ondansetron  (ZOFRAN -ODT) 4 MG disintegrating tablet Take 4 mg by mouth every 8 (eight) hours as needed for nausea or vomiting.     oxybutynin  (DITROPAN  XL) 15 MG 24 hr tablet Take 15 mg by mouth daily.     pantoprazole  (PROTONIX ) 20 MG tablet Take 20 mg by mouth daily.     PARoxetine  (PAXIL ) 20 MG tablet Take 20 mg by mouth daily.     polyethylene glycol (MIRALAX  / GLYCOLAX ) 17 g packet Take 17 g by mouth daily.     rosuvastatin  (CRESTOR ) 20 MG tablet Take 1 tablet (20 mg total) by mouth daily. 90 tablet 3   senna-docusate (SENOKOT-S) 8.6-50 MG tablet Take 1 tablet by mouth daily.     sucralfate (CARAFATE) 1 g tablet Take 1 g by mouth 4 (four) times daily.     No current facility-administered medications for this visit.    Allergies as of 04/05/2024   (No Known Allergies)    Family History  Problem Relation Age of Onset    Colon cancer Mother    Blindness Sister    Anesthesia problems Neg Hx    Hypotension Neg Hx    Malignant hyperthermia Neg Hx    Pseudochol deficiency Neg Hx     Social History   Socioeconomic History   Marital status: Widowed    Spouse name: Not on file   Number of children: 2   Years of education: Not on file   Highest education level: Not on file  Occupational History   Occupation: Retired-Office work  Tobacco Use   Smoking status: Former    Current packs/day: 0.00    Types: Cigarettes    Quit date: 09/27/1994    Years since quitting: 29.5   Smokeless tobacco: Never  Vaping Use   Vaping status: Never Used  Substance and Sexual Activity   Alcohol  use: No   Drug use: No   Sexual activity: Not Currently  Other Topics Concern   Not on file  Social History Narrative   ** Merged History Encounter **       Social Drivers of Health   Financial Resource Strain: Low Risk  (12/01/2023)   Received from [facility]   Overall Financial Resource Strain (CARDIA)    Difficulty of Paying Living Expenses: Not hard at all  Food Insecurity: No Food Insecurity (12/01/2023)   Received from [facility]   Hunger Vital Sign    Within the past 12 months, you worried that your food would run out before you got the money to buy more.: Never true    Within the past 12 months, the food you bought just didn't last and you didn't have money to get more.: Never true  Transportation Needs: No Transportation Needs (12/12/2023)   Received from [facility]   PRAPARE - Transportation    Lack of Transportation (Medical): No    Lack of Transportation (Non-Medical): No  Physical Activity: Insufficiently Active (05/26/2023)   Received from [facility]   Exercise Vital Sign    On average, how many days per week do you engage in moderate to strenuous exercise (like a brisk walk)?: 4 days    On average, how many minutes do you engage in exercise at this level?: 30 min  Stress: No Stress Concern  Present (05/26/2023)   Received from [facility] - Occupational Stress Questionnaire    Feeling of Stress : Only a little  Social Connections: Moderately Integrated (05/26/2023)   Received from [facility]   Social Connection and Isolation Panel    In a typical week, how many times do you talk on the phone with family, friends, or neighbors?: Twice a week    How often do you get together with friends or relatives?: Twice a week    How often do you attend church or religious services?: 1 to 4 times per year    Do you belong to any clubs or organizations such as church groups, unions, fraternal or athletic groups, or school groups?: No    How often do you attend meetings of the clubs or organizations you belong to?: 1 to 4 times per year    Are you married, widowed, divorced, separated, never married, or living with a partner?: Widowed  Intimate Partner Violence: Unknown (03/02/2023)   Humiliation, Afraid, Rape, and Kick questionnaire    Fear of Current or Ex-Partner: No    Emotionally Abused: No    Physically Abused: Not on file    Sexually Abused: No     Review of Systems   Gen: Denies any fever, chills, fatigue, weight loss, lack of appetite.  CV: Denies chest pain, heart palpitations, peripheral edema, syncope.  Resp: Denies shortness of breath at rest or with exertion. Denies wheezing or cough.  GI: Denies dysphagia or odynophagia. Denies jaundice, hematemesis, fecal incontinence. GU : Denies urinary burning, urinary frequency, urinary hesitancy MS: Denies joint pain, muscle weakness, cramps, or limitation of movement.  Derm: Denies rash, itching, dry skin Psych: Denies depression, anxiety, memory loss, and confusion Heme: Denies bruising, bleeding, and enlarged lymph nodes.   Physical Exam   BP 119/68   Pulse (!) 58   Temp 98.3 F (36.8 C)   Ht 5' 9.5 (1.765 m)   Wt 140 lb (63.5 kg)   BMI  20.38 kg/m  General:   Alert and  oriented. Pleasant and cooperative. Well-nourished and well-developed.  Head:  Normocephalic and atraumatic. Eyes:  Without icterus Abdomen:  +BS, soft, non-tender and non-distended. Limited exam as patient in wheelchair.  Rectal:  Deferred  Msk:  right aka Neurologic:  Alert and  oriented x4;  grossly normal neurologically. Skin:  Intact without significant lesions or rashes. Psych:  Alert and cooperative. Normal mood and affect.   Assessment   KYNZLEE HUCKER is an 89 y.o. female presenting today with a history of GERD, epigastric pain, large hiatal hernia, constipation, last seen in Jan 2025 by myself and doing well. She returns today in follow-up.   Recent bout of reported coffee ground emesis and presented to [facility] March [facility]25. CT chest with large hiatal hernia at that time. Hgb on admission was 14 but over course of hospitalization dropped to 9/10 range. Discharge Hgb 11.2. During admission, no obvious hematemesis or overt GI bleeding was noted, and she was heme negative. SHe also was treated for possible aspiration pneumonia. Iron was low at 11, no ferritin. She has had resolution of symptoms since that time and doing well on pantoprazole  20 mg daily and Zofran  prn. She would prefer to avoid EGD unless needed. Suspect with large hiatal hernia, she could certainly have Ole lesions that could have caused reported coffee grounds and drop in Hgb. GERD worsened by known large hiatal hernia. With iron low, need to update labs if possible. May need to consider EGD if persistent IDA. Would hold on colonoscopy due to age.   Constipation: was taking Miralax  daily but now just as needed.    PLAN   Increase pantoprazole  from 20 mg to 40 mg daily Can stop Carafate Zofran  prn Update CBC and iron studies May need to consider EGD if persistent IDA; would hold on colonoscopy due to age and no overt lower GI bleeding Return in 4-6 months regardless  Paige MICAEL Stager, PhD, ANP-BC [facility]  Gastroenterology

## 2024-04-06 ENCOUNTER — Telehealth: Payer: Self-pay

## 2024-04-06 DIAGNOSIS — R2689 Other abnormalities of gait and mobility: Secondary | ICD-10-CM | POA: Diagnosis not present

## 2024-04-06 DIAGNOSIS — M62561 Muscle wasting and atrophy, not elsewhere classified, right lower leg: Secondary | ICD-10-CM | POA: Diagnosis not present

## 2024-04-06 DIAGNOSIS — M62562 Muscle wasting and atrophy, not elsewhere classified, left lower leg: Secondary | ICD-10-CM | POA: Diagnosis not present

## 2024-04-06 NOTE — Transitions of Care (Post Inpatient/ED Visit) (Signed)
 04/06/2024  Patient ID: [name], female   DOB: Jul 10, 1933, 89 y.o.   MRN: [id number]  Patient on transition of care list.  Telephone call to [facility], patient remains a resident there.  RN CM closing case.    Kaithlyn Teagle J. Keiden Deskin RN, MSN [facility], [facility] RN Care Manager Direct Dial: [phone]  Fax: [phone] Website: [email or web address]

## 2024-04-08 ENCOUNTER — Telehealth: Payer: Self-pay | Admitting: Gastroenterology

## 2024-04-08 NOTE — Telephone Encounter (Signed)
 Dena: could we please have patient complete updated CBC and iron studies at her rehab facility? Thanks!

## 2024-04-09 ENCOUNTER — Other Ambulatory Visit: Payer: Self-pay

## 2024-04-09 DIAGNOSIS — M62561 Muscle wasting and atrophy, not elsewhere classified, right lower leg: Secondary | ICD-10-CM | POA: Diagnosis not present

## 2024-04-09 DIAGNOSIS — K219 Gastro-esophageal reflux disease without esophagitis: Secondary | ICD-10-CM

## 2024-04-09 DIAGNOSIS — R2689 Other abnormalities of gait and mobility: Secondary | ICD-10-CM | POA: Diagnosis not present

## 2024-04-09 DIAGNOSIS — M62562 Muscle wasting and atrophy, not elsewhere classified, left lower leg: Secondary | ICD-10-CM | POA: Diagnosis not present

## 2024-04-09 DIAGNOSIS — R112 Nausea with vomiting, unspecified: Secondary | ICD-10-CM

## 2024-04-09 NOTE — Telephone Encounter (Signed)
 Noted  Labs put in and faxed to [facility]

## 2024-04-10 DIAGNOSIS — R2689 Other abnormalities of gait and mobility: Secondary | ICD-10-CM | POA: Diagnosis not present

## 2024-04-10 DIAGNOSIS — M62561 Muscle wasting and atrophy, not elsewhere classified, right lower leg: Secondary | ICD-10-CM | POA: Diagnosis not present

## 2024-04-10 DIAGNOSIS — M62562 Muscle wasting and atrophy, not elsewhere classified, left lower leg: Secondary | ICD-10-CM | POA: Diagnosis not present

## 2024-04-11 ENCOUNTER — Telehealth: Payer: Self-pay

## 2024-04-11 DIAGNOSIS — J449 Chronic obstructive pulmonary disease, unspecified: Secondary | ICD-10-CM | POA: Diagnosis not present

## 2024-04-11 DIAGNOSIS — D638 Anemia in other chronic diseases classified elsewhere: Secondary | ICD-10-CM | POA: Diagnosis not present

## 2024-04-11 DIAGNOSIS — M62562 Muscle wasting and atrophy, not elsewhere classified, left lower leg: Secondary | ICD-10-CM | POA: Diagnosis not present

## 2024-04-11 DIAGNOSIS — I2510 Atherosclerotic heart disease of native coronary artery without angina pectoris: Secondary | ICD-10-CM | POA: Diagnosis not present

## 2024-04-11 DIAGNOSIS — I5032 Chronic diastolic (congestive) heart failure: Secondary | ICD-10-CM | POA: Diagnosis not present

## 2024-04-11 DIAGNOSIS — R2689 Other abnormalities of gait and mobility: Secondary | ICD-10-CM | POA: Diagnosis not present

## 2024-04-11 DIAGNOSIS — M62561 Muscle wasting and atrophy, not elsewhere classified, right lower leg: Secondary | ICD-10-CM | POA: Diagnosis not present

## 2024-04-11 NOTE — Telephone Encounter (Signed)
 Pt's lab results from [facility] are being scanned into the chart for your review

## 2024-04-12 DIAGNOSIS — R2689 Other abnormalities of gait and mobility: Secondary | ICD-10-CM | POA: Diagnosis not present

## 2024-04-12 DIAGNOSIS — M62562 Muscle wasting and atrophy, not elsewhere classified, left lower leg: Secondary | ICD-10-CM | POA: Diagnosis not present

## 2024-04-12 DIAGNOSIS — M62561 Muscle wasting and atrophy, not elsewhere classified, right lower leg: Secondary | ICD-10-CM | POA: Diagnosis not present

## 2024-04-13 DIAGNOSIS — R2689 Other abnormalities of gait and mobility: Secondary | ICD-10-CM | POA: Diagnosis not present

## 2024-04-13 DIAGNOSIS — M62562 Muscle wasting and atrophy, not elsewhere classified, left lower leg: Secondary | ICD-10-CM | POA: Diagnosis not present

## 2024-04-13 DIAGNOSIS — M62561 Muscle wasting and atrophy, not elsewhere classified, right lower leg: Secondary | ICD-10-CM | POA: Diagnosis not present

## 2024-04-14 DIAGNOSIS — M62561 Muscle wasting and atrophy, not elsewhere classified, right lower leg: Secondary | ICD-10-CM | POA: Diagnosis not present

## 2024-04-14 DIAGNOSIS — M62562 Muscle wasting and atrophy, not elsewhere classified, left lower leg: Secondary | ICD-10-CM | POA: Diagnosis not present

## 2024-04-14 DIAGNOSIS — R2689 Other abnormalities of gait and mobility: Secondary | ICD-10-CM | POA: Diagnosis not present

## 2024-04-16 DIAGNOSIS — M62562 Muscle wasting and atrophy, not elsewhere classified, left lower leg: Secondary | ICD-10-CM | POA: Diagnosis not present

## 2024-04-16 DIAGNOSIS — R2689 Other abnormalities of gait and mobility: Secondary | ICD-10-CM | POA: Diagnosis not present

## 2024-04-16 DIAGNOSIS — M62561 Muscle wasting and atrophy, not elsewhere classified, right lower leg: Secondary | ICD-10-CM | POA: Diagnosis not present

## 2024-04-17 DIAGNOSIS — M62561 Muscle wasting and atrophy, not elsewhere classified, right lower leg: Secondary | ICD-10-CM | POA: Diagnosis not present

## 2024-04-17 DIAGNOSIS — M62562 Muscle wasting and atrophy, not elsewhere classified, left lower leg: Secondary | ICD-10-CM | POA: Diagnosis not present

## 2024-04-17 DIAGNOSIS — R2689 Other abnormalities of gait and mobility: Secondary | ICD-10-CM | POA: Diagnosis not present

## 2024-04-18 DIAGNOSIS — R2689 Other abnormalities of gait and mobility: Secondary | ICD-10-CM | POA: Diagnosis not present

## 2024-04-18 DIAGNOSIS — S88011D Complete traumatic amputation at knee level, right lower leg, subsequent encounter: Secondary | ICD-10-CM | POA: Diagnosis not present

## 2024-04-18 DIAGNOSIS — R627 Adult failure to thrive: Secondary | ICD-10-CM | POA: Diagnosis not present

## 2024-04-18 DIAGNOSIS — I739 Peripheral vascular disease, unspecified: Secondary | ICD-10-CM | POA: Diagnosis not present

## 2024-04-18 DIAGNOSIS — M62562 Muscle wasting and atrophy, not elsewhere classified, left lower leg: Secondary | ICD-10-CM | POA: Diagnosis not present

## 2024-04-18 DIAGNOSIS — M62561 Muscle wasting and atrophy, not elsewhere classified, right lower leg: Secondary | ICD-10-CM | POA: Diagnosis not present

## 2024-04-19 DIAGNOSIS — M62562 Muscle wasting and atrophy, not elsewhere classified, left lower leg: Secondary | ICD-10-CM | POA: Diagnosis not present

## 2024-04-19 DIAGNOSIS — R2689 Other abnormalities of gait and mobility: Secondary | ICD-10-CM | POA: Diagnosis not present

## 2024-04-19 DIAGNOSIS — M62561 Muscle wasting and atrophy, not elsewhere classified, right lower leg: Secondary | ICD-10-CM | POA: Diagnosis not present

## 2024-04-20 DIAGNOSIS — M62562 Muscle wasting and atrophy, not elsewhere classified, left lower leg: Secondary | ICD-10-CM | POA: Diagnosis not present

## 2024-04-20 DIAGNOSIS — R2689 Other abnormalities of gait and mobility: Secondary | ICD-10-CM | POA: Diagnosis not present

## 2024-04-20 DIAGNOSIS — M62561 Muscle wasting and atrophy, not elsewhere classified, right lower leg: Secondary | ICD-10-CM | POA: Diagnosis not present

## 2024-04-21 DIAGNOSIS — M62562 Muscle wasting and atrophy, not elsewhere classified, left lower leg: Secondary | ICD-10-CM | POA: Diagnosis not present

## 2024-04-21 DIAGNOSIS — R2689 Other abnormalities of gait and mobility: Secondary | ICD-10-CM | POA: Diagnosis not present

## 2024-04-21 DIAGNOSIS — M62561 Muscle wasting and atrophy, not elsewhere classified, right lower leg: Secondary | ICD-10-CM | POA: Diagnosis not present

## 2024-04-23 DIAGNOSIS — M62562 Muscle wasting and atrophy, not elsewhere classified, left lower leg: Secondary | ICD-10-CM | POA: Diagnosis not present

## 2024-04-23 DIAGNOSIS — M62561 Muscle wasting and atrophy, not elsewhere classified, right lower leg: Secondary | ICD-10-CM | POA: Diagnosis not present

## 2024-04-23 DIAGNOSIS — R2689 Other abnormalities of gait and mobility: Secondary | ICD-10-CM | POA: Diagnosis not present

## 2024-04-24 DIAGNOSIS — M62562 Muscle wasting and atrophy, not elsewhere classified, left lower leg: Secondary | ICD-10-CM | POA: Diagnosis not present

## 2024-04-24 DIAGNOSIS — M62561 Muscle wasting and atrophy, not elsewhere classified, right lower leg: Secondary | ICD-10-CM | POA: Diagnosis not present

## 2024-04-24 DIAGNOSIS — R2689 Other abnormalities of gait and mobility: Secondary | ICD-10-CM | POA: Diagnosis not present

## 2024-04-25 DIAGNOSIS — R2689 Other abnormalities of gait and mobility: Secondary | ICD-10-CM | POA: Diagnosis not present

## 2024-04-25 DIAGNOSIS — M62562 Muscle wasting and atrophy, not elsewhere classified, left lower leg: Secondary | ICD-10-CM | POA: Diagnosis not present

## 2024-04-25 DIAGNOSIS — M62561 Muscle wasting and atrophy, not elsewhere classified, right lower leg: Secondary | ICD-10-CM | POA: Diagnosis not present

## 2024-04-26 DIAGNOSIS — R2689 Other abnormalities of gait and mobility: Secondary | ICD-10-CM | POA: Diagnosis not present

## 2024-04-26 DIAGNOSIS — M62562 Muscle wasting and atrophy, not elsewhere classified, left lower leg: Secondary | ICD-10-CM | POA: Diagnosis not present

## 2024-04-26 DIAGNOSIS — M62561 Muscle wasting and atrophy, not elsewhere classified, right lower leg: Secondary | ICD-10-CM | POA: Diagnosis not present

## 2024-04-27 DIAGNOSIS — R2681 Unsteadiness on feet: Secondary | ICD-10-CM | POA: Diagnosis not present

## 2024-04-27 DIAGNOSIS — M6281 Muscle weakness (generalized): Secondary | ICD-10-CM | POA: Diagnosis not present

## 2024-05-01 DIAGNOSIS — R2681 Unsteadiness on feet: Secondary | ICD-10-CM | POA: Diagnosis not present

## 2024-05-01 DIAGNOSIS — M6281 Muscle weakness (generalized): Secondary | ICD-10-CM | POA: Diagnosis not present

## 2024-05-02 DIAGNOSIS — R2681 Unsteadiness on feet: Secondary | ICD-10-CM | POA: Diagnosis not present

## 2024-05-03 DIAGNOSIS — R2681 Unsteadiness on feet: Secondary | ICD-10-CM | POA: Diagnosis not present

## 2024-05-04 DIAGNOSIS — R2681 Unsteadiness on feet: Secondary | ICD-10-CM | POA: Diagnosis not present

## 2024-05-15 NOTE — Telephone Encounter (Signed)
 Outside Hgb in July 2025 was 9.9, iron low at 31, sats low at 14.   She has had a decline in Hgb without overt GI bleeding. As discussed at visit in July 2025, we could pursue EGD if she is willing.   Just let me know!

## 2024-05-16 NOTE — Telephone Encounter (Signed)
 Phoned the pt's daughter Zahlia Deshazer @ [phone] regarding the pt's result note / recommendations.

## 2024-05-18 NOTE — Telephone Encounter (Signed)
 Phoned and LMOVM for the pt's daughter to return call regarding her mother's labs

## 2024-05-20 DIAGNOSIS — R627 Adult failure to thrive: Secondary | ICD-10-CM | POA: Diagnosis not present

## 2024-05-20 DIAGNOSIS — I739 Peripheral vascular disease, unspecified: Secondary | ICD-10-CM | POA: Diagnosis not present

## 2024-05-20 DIAGNOSIS — S88011D Complete traumatic amputation at knee level, right lower leg, subsequent encounter: Secondary | ICD-10-CM | POA: Diagnosis not present

## 2024-05-23 NOTE — Telephone Encounter (Signed)
 Letter mailed to the pt.

## 2024-06-29 NOTE — Progress Notes (Signed)
 Labs look ok for now. Continue current medication for now

## 2024-07-01 NOTE — Nursing Note (Signed)
 RSD REMAINS ON THERAPY CASELOAD

## 2024-08-08 ENCOUNTER — Ambulatory Visit: Admitting: Gastroenterology

## 2024-09-03 ENCOUNTER — Other Ambulatory Visit

## 2024-09-03 ENCOUNTER — Ambulatory Visit: Admitting: Cardiology

## 2024-09-10 NOTE — Care Plan (Signed)
 Functional Abilities & Goals Team Meeting 09/10/2024 12:25 PM    Patient Name: [name] MRN:  [id number] Admit Date/Time: 04/27/2023  1:23 PM Date of Birth:  1933/07/25 Sex:  Female Room/Bed: S102/S102-01   Self Care Decision for Usual Performance:  Eating: Setup or clean-up assistance Toileting: Dependent   Mobility Decision for Usual Performance: Sit to Lying: Substantial / maximal assistance Lying to Sitting on the Side of the Bed: Substantial / maximal assistance Sit to Stand: Not attempted due to medical condition or safety concerns Chair/bed to Chair Transfer: Substantial / maximal assistance Toilet Transfer: Not applicable

## 2024-09-17 NOTE — Progress Notes (Signed)
 [facility]  MEDICAL PROGRESS NOTE Date: 09/17/2024 Patient Name / DOB: [name]  1933/03/27   Room: S102/S102-01    ASSESSMENT and PLAN: Patient is a 89 y.o. year-old female    Principal Problem:   NSTEMI (non-ST elevated myocardial infarction) (CMS-HCC) Active Problems:   Encounter for orthopedic aftercare following surgical amputation   Acquired absence of right leg above knee    (CMS-HCC)   Peripheral vascular disease, unspecified   Muscle weakness (generalized)   Other abnormalities of gait and mobility   Muscle wasting and atrophy, not elsewhere classified, left upper arm   Muscle wasting and atrophy, not elsewhere classified, right upper arm   Cognitive communication deficit   Chronic obstructive pulmonary disease, unspecified (CMS-HCC)   Atherosclerotic heart disease of native coronary artery without angina pectoris   Unspecified osteoarthritis, unspecified site   Thyroid  disease   Hyperlipidemia, unspecified   Depression, unspecified   Hypokalemia   Anxiety disorder, unspecified   Unspecified mood (affective) disorder   Unspecified urinary incontinence   Anemia in other chronic diseases classified elsewhere   S/P TAVR (transcatheter aortic valve replacement)   Nonrheumatic aortic (valve) stenosis   Overactive bladder   Repeated falls   Need for COVID-19 vaccine   High degree atrioventricular block   Paroxysmal A-fib (CMS-HCC)   Heart failure with mildly reduced ejection fraction (CMS-HCC)   Cardiac pacemaker in situ   Asymptomatic bacteriuria   Chest pain, unspecified   Abnormal posture   Muscle wasting and atrophy, not elsewhere classified, left lower leg   Cellulitis of left lower extremity   Infection   INTERVAL HISTORY / SUBJECTIVE:   Chief Complaint / Reason for Visit:  Patient seen today for a follow up visit.  During my visit this pleasant patient was resting comfortably. Overall she stated  that she been doing well Denies having any cough fever chills Denies any chest pain or shortness of breath    PMH Reviewed:  Yes    Review of Systems: Denies having any major falls or suffering skin tears   OBJECTIVE:   Wt Readings from Last 1 Encounters:  08/29/24 61.4 kg (135 lb 6.4 oz)   Temp Readings from Last 1 Encounters:  09/16/24 37.2 C (98.9 F) (Temporal)   BP Readings from Last 1 Encounters:  09/16/24 125/58   Pulse Readings from Last 1 Encounters:  09/16/24 71        Physical Exam: General appearance: alert, cooperative, and no distress Neck: no adenopathy, no carotid bruit, and no JVD Lungs: clear to auscultation bilaterally. Heart: regular rate and rhythm, S1, S2 normal, no murmur, click, rub or gallop Abdomen: soft, non-tender; bowel sounds normal; no masses,  no organomegaly Extremities.  The right above-knee amputation    Diagnostic Studies:   Labs Reviewed:  Yes Lab Results  Component Value Date   WBC 7.0 06/08/2024   HGB 10.6 (L) 06/08/2024   HCT 31.9 (L) 06/08/2024   PLT 167 06/08/2024    Lab Results  Component Value Date   NA 139 06/29/2024   K 4.8 06/29/2024   CL 101 06/29/2024   CO2 34.1 (H) 06/29/2024   BUN 31 (H) 06/29/2024   CREATININE 0.83 06/29/2024   GLU 81 06/29/2024   CALCIUM  8.7 06/29/2024   MG 2.4 06/20/2024    Lab Results  Component Value Date   BILITOT 0.4 06/13/2024   PROT 6.1 06/13/2024   ALBUMIN 3.5 06/13/2024   ALT 13  06/13/2024   AST 30 06/13/2024   ALKPHOS 80 06/13/2024    Lab Results  Component Value Date   PT 11.6 06/05/2024   INR 1.02 06/05/2024   APTT 30.8 06/05/2024     Drug Regimen Review Completed. Medications were reviewed and reconciled.  Current Meds; Scheduled Scheduled meds with Route[1] PRN nitroglycerin , 0.4 mg, Q5 Min PRN ondansetron , 4 mg, Q6H PRN   Discussions were held with: []  Therapy  []  Nursing  []  SW  []  Outside MD  []  Family   Patient is a chronic care case No  new meds at present time We will monitor her closely Further plans are to follow   Yancey DELENA Reno, MD          [1]  acetaminophen  (TYLENOL ) tablet 650 mg TID   aspirin  (ECOTRIN) tablet 81 mg Daily   empagliflozin (JARDIANCE) tablet 10 mg Daily   furosemide  (LASIX ) tablet 80 mg Daily   levothyroxine  (SYNTHROID ) tablet 150 mcg daily   metoPROLOL  succinate (Toprol -XL) 24 hr tablet 25 mg Daily   omeprazole (PriLOSEC) capsule 20 mg Daily before breakfast   oxybutynin  (DITROPAN  XL) 24 hr tablet 15 mg Daily   PARoxetine  (PAXIL ) tablet 20 mg Daily   polyethylene glycol (MIRALAX ) packet 17 g Daily   rosuvastatin  (CRESTOR ) tablet 20 mg Daily   senna-docusate (PERICOLACE) 8.6-50 mg 1 tablet Nightly   spironolactone (ALDACTONE) tablet 25 mg Daily   valsartan (DIOVAN) tablet 40 mg Daily

## 2024-09-25 NOTE — Group Note (Signed)
 LTC Activity Therapy Note <redacted file path>  Number of Participants: 5 Group Date: 09/25/2024 Start Time: 1100 End Time: 1200 Facilitators: Licia Barnie CROME, LRT/CTRS    Bingo Patient Name: BRITTA LOUTH MRN: [id number] Today's Date: 09/25/2024            I attest that I have reviewed the above information. Signed: Barnie CROME Licia, LRT/CTRS  Filed 09/25/2024

## 2024-09-26 NOTE — Group Note (Signed)
 LTC Activity Therapy Note <redacted file path>  Number of Participants: 13 Group Date: 09/26/2024 Start Time: 1345 End Time: 1500 Facilitators: Licia Barnie CROME, LRT/CTRS    New years eve party Patient Name: [name] MRN: [id number] Today's Date: 09/26/2024  Group Activity: Games/Puzzles  Group Activity Participation: Active  Comments: Corn hole   I attest that I have reviewed the above information. Signed: Barnie CROME Licia, LRT/CTRS  Filed 09/26/2024

## 2024-09-26 NOTE — Group Note (Signed)
 LTC Activity Therapy Note <redacted file path>  Number of Participants: 4 Group Date: 09/26/2024 Start Time: 1100 End Time: 1230 Facilitators: Robbert Rosaline LABOR, CNA   Manicure Wednesday Patient Name: [name] MRN: [id number] Today's Date: 09/26/2024            I attest that I have reviewed the above information. Signed: Rosaline LABOR Robbert, CNA Filed 09/26/2024

## 2024-10-02 NOTE — Group Note (Signed)
 LTC Activity Therapy Note <redacted file path>  Number of Participants: 13 Group Date: 10/02/2024 Start Time: 1400 End Time: 1500 Facilitators: Licia Barnie CROME, LRT/CTRS    Bingo Patient Name: DONNIKA KUCHER MRN: [id number] Today's Date: 10/02/2024            I attest that I have reviewed the above information. Signed: Barnie CROME Licia, LRT/CTRS  Filed 10/02/2024

## 2024-10-03 NOTE — Nursing Note (Signed)
 Pharmacy Consultant Review   [name] is a 89 y.o. female resident of the long term care facility.   Current Medications[1]  Discontinued/Completed Medications (720h ago, onward)    None       Patient Vitals for the past 720 hrs:  BP Temp Temp src Pulse Resp SpO2  10/03/24 0717 109/40 36.6 C (97.8 F) -- 62 17 92 %  10/02/24 2123 132/54 36.6 C (97.9 F) -- 68 16 94 %  10/02/24 0900 95/53 36.4 C (97.5 F) Oral 75 19 93 %  10/02/24 0744 95/53 36.4 C (97.5 F) Temporal 75 19 93 %  10/01/24 2125 100/51 36.3 C (97.4 F) -- 56 16 99 %  10/01/24 0737 118/96 -- -- -- -- --  10/01/24 0727 118/96 36.3 C (97.3 F) Temporal 63 18 97 %  09/30/24 2000 113/72 36.7 C (98.1 F) Temporal 66 18 95 %  09/29/24 2000 115/59 36.3 C (97.3 F) Temporal 77 18 96 %  09/29/24 0726 100/47 36.3 C (97.4 F) -- 68 18 96 %  09/28/24 1910 121/45 36.4 C (97.5 F) Tympanic 68 18 97 %  09/28/24 0729 113/41 36.7 C (98 F) -- 82 18 95 %  09/27/24 1957 (!) 78/46 -- -- 71 18 92 %  09/27/24 0753 114/55 36.4 C (97.5 F) Temporal 79 16 94 %  09/26/24 1951 (!) 82/53 36.6 C (97.9 F) -- 69 18 92 %  09/26/24 0722 106/42 36.3 C (97.3 F) Temporal 80 16 97 %  09/25/24 2000 110/70 36.3 C (97.4 F) Temporal 66 18 92 %  09/25/24 0900 122/44 36.5 C (97.7 F) -- 81 20 96 %  09/25/24 0700 122/44 36.5 C (97.7 F) -- 81 20 96 %  09/24/24 1954 110/36 -- -- 69 18 95 %  09/24/24 1953 128/73 36.4 C (97.5 F) Temporal 73 17 95 %  09/24/24 0855 138/57 -- -- 74 -- --  09/23/24 2101 118/46 -- -- -- -- --  09/23/24 2007 -- 36.6 C (97.8 F) -- 72 18 93 %  09/23/24 0723 111/59 36.3 C (97.3 F) Temporal 82 16 94 %  09/22/24 1950 112/61 36.6 C (97.8 F) -- 66 18 92 %  09/22/24 0719 98/58 36.2 C (97.1 F) Temporal 80 16 95 %  09/21/24 1937 92/34 36.4 C (97.5 F) Temporal 71 20 96 %  09/20/24 1923 112/47 36.3 C (97.4 F) Temporal 73 17 98 %  09/20/24 0716 107/60 36.3 C (97.3 F) Temporal 69 16 96 %   09/19/24 2000 107/64 36.2 C (97.1 F) Temporal 65 18 98 %  09/18/24 0900 -- 36.4 C (97.6 F) Temporal 78 18 94 %  09/18/24 0700 -- 36.4 C (97.6 F) Temporal 78 18 94 %  09/16/24 2000 125/58 37.2 C (98.9 F) Temporal 71 18 93 %  09/15/24 1900 104/56 36.1 C (96.9 F) Tympanic 82 16 94 %  09/15/24 0700 110/42 36.5 C (97.7 F) -- 76 18 94 %  09/14/24 1900 120/56 36.6 C (97.9 F) Tympanic 70 18 97 %  09/14/24 0726 122/50 36.5 C (97.7 F) -- 71 17 98 %  09/13/24 1900 115/65 36.5 C (97.7 F) Oral 73 18 95 %  09/13/24 0818 105/69 -- -- -- -- --  09/12/24 2024 133/90 36.3 C (97.4 F) -- 75 18 95 %  09/12/24 0730 101/63 36.3 C (97.3 F) Temporal 78 16 95 %  09/11/24 2300 106/41 36.3 C (97.3 F) -- 60 16 --  09/11/24 1900 105/42 36.7 C (  98.1 F) Oral 78 14 92 %  09/11/24 0900 105/42 36.7 C (98.1 F) Oral 78 14 92 %  09/11/24 0700 105/42 36.7 C (98.1 F) Oral 78 14 92 %  09/10/24 1930 129/58 36.2 C (97.1 F) Tympanic 69 16 98 %  09/10/24 0859 -- -- -- 65 -- --  09/09/24 1900 135/51 36.5 C (97.7 F) -- 79 16 96 %  09/09/24 0715 99/56 36.3 C (97.3 F) Temporal 78 17 94 %  09/09/24 0222 112/69 36.8 C (98.2 F) -- 70 18 98 %  09/08/24 0748 119/46 -- -- -- -- --  09/08/24 0727 -- 36.7 C (98 F) -- 67 17 93 %  09/07/24 0718 109/59 36.2 C (97.1 F) Temporal 82 16 97 %  09/06/24 1900 110/72 36.8 C (98.2 F) Tympanic 64 17 94 %  09/05/24 1947 110/59 36.7 C (98.1 F) -- 79 18 97 %  09/05/24 0731 101/40 36.1 C (97 F) Axillary 89 17 92 %  09/04/24 1900 138/47 36.2 C (97.2 F) -- 72 16 97 %  09/04/24 0900 98/69 36.6 C (97.8 F) Temporal 74 20 96 %  09/04/24 0809 98/69 36.6 C (97.8 F) Temporal 74 20 96 %  09/03/24 1900 114/53 36.3 C (97.4 F) -- 68 16 92 %   Weight: 135 lbs Labs Reviewed: no new results  Current Medications Reviewed.   Thank you,  Rollene Slack, PharmD           [1]  Current Facility-Administered Medications:    acetaminophen  (TYLENOL )  tablet 650 mg, Oral, TID   aspirin  (ECOTRIN) tablet 81 mg, Oral, Daily   empagliflozin (JARDIANCE) tablet 10 mg, Oral, Daily   furosemide  (LASIX ) tablet 80 mg, Oral, Daily   levothyroxine  (SYNTHROID ) tablet 150 mcg, Oral, daily   metoPROLOL  succinate (Toprol -XL) 24 hr tablet 25 mg, Oral, Daily   nitroglycerin  (NITROSTAT ) SL tablet 0.4 mg, Sublingual, Q5 Min PRN   omeprazole (PriLOSEC) capsule 20 mg, Oral, Daily before breakfast   ondansetron  (ZOFRAN ) tablet 4 mg, Oral, Q6H PRN   oxybutynin  (DITROPAN  XL) 24 hr tablet 15 mg, Oral, Daily   PARoxetine  (PAXIL ) tablet 20 mg, Oral, Daily   polyethylene glycol (MIRALAX ) packet 17 g, Oral, Daily   rosuvastatin  (CRESTOR ) tablet 20 mg, Oral, Daily   senna-docusate (PERICOLACE) 8.6-50 mg 1 tablet, Oral, Nightly   spironolactone (ALDACTONE) tablet 25 mg, Oral, Daily   valsartan (DIOVAN) tablet 40 mg, Oral, Daily

## 2024-10-04 ENCOUNTER — Emergency Department (HOSPITAL_COMMUNITY)

## 2024-10-04 ENCOUNTER — Inpatient Hospital Stay (HOSPITAL_COMMUNITY)
Admission: EM | Admit: 2024-10-04 | Discharge: 2024-10-12 | DRG: 240 | Disposition: A | Discharge status: Skilled Nursing Facility | Source: Skilled Nursing Facility | Attending: Internal Medicine | Admitting: Internal Medicine

## 2024-10-04 ENCOUNTER — Other Ambulatory Visit: Payer: Self-pay

## 2024-10-04 ENCOUNTER — Encounter (HOSPITAL_COMMUNITY): Payer: Self-pay | Admitting: Emergency Medicine

## 2024-10-04 DIAGNOSIS — Z79890 Hormone replacement therapy (postmenopausal): Secondary | ICD-10-CM | POA: Diagnosis not present

## 2024-10-04 DIAGNOSIS — M86172 Other acute osteomyelitis, left ankle and foot: Secondary | ICD-10-CM | POA: Diagnosis not present

## 2024-10-04 DIAGNOSIS — Z8709 Personal history of other diseases of the respiratory system: Secondary | ICD-10-CM

## 2024-10-04 DIAGNOSIS — M79606 Pain in leg, unspecified: Secondary | ICD-10-CM | POA: Diagnosis not present

## 2024-10-04 DIAGNOSIS — E785 Hyperlipidemia, unspecified: Secondary | ICD-10-CM | POA: Diagnosis present

## 2024-10-04 DIAGNOSIS — E890 Postprocedural hypothyroidism: Secondary | ICD-10-CM | POA: Diagnosis present

## 2024-10-04 DIAGNOSIS — L03116 Cellulitis of left lower limb: Secondary | ICD-10-CM | POA: Diagnosis present

## 2024-10-04 DIAGNOSIS — Z96652 Presence of left artificial knee joint: Secondary | ICD-10-CM | POA: Diagnosis present

## 2024-10-04 DIAGNOSIS — I998 Other disorder of circulatory system: Secondary | ICD-10-CM | POA: Diagnosis present

## 2024-10-04 DIAGNOSIS — Z9049 Acquired absence of other specified parts of digestive tract: Secondary | ICD-10-CM | POA: Diagnosis not present

## 2024-10-04 DIAGNOSIS — F419 Anxiety disorder, unspecified: Secondary | ICD-10-CM | POA: Diagnosis present

## 2024-10-04 DIAGNOSIS — K5900 Constipation, unspecified: Secondary | ICD-10-CM | POA: Diagnosis present

## 2024-10-04 DIAGNOSIS — I70222 Atherosclerosis of native arteries of extremities with rest pain, left leg: Principal | ICD-10-CM | POA: Diagnosis present

## 2024-10-04 DIAGNOSIS — Z96642 Presence of left artificial hip joint: Secondary | ICD-10-CM | POA: Diagnosis present

## 2024-10-04 DIAGNOSIS — R6889 Other general symptoms and signs: Secondary | ICD-10-CM | POA: Diagnosis not present

## 2024-10-04 DIAGNOSIS — I70245 Atherosclerosis of native arteries of left leg with ulceration of other part of foot: Secondary | ICD-10-CM | POA: Diagnosis not present

## 2024-10-04 DIAGNOSIS — Z7409 Other reduced mobility: Secondary | ICD-10-CM | POA: Diagnosis not present

## 2024-10-04 DIAGNOSIS — I10 Essential (primary) hypertension: Secondary | ICD-10-CM | POA: Diagnosis present

## 2024-10-04 DIAGNOSIS — E860 Dehydration: Secondary | ICD-10-CM | POA: Diagnosis not present

## 2024-10-04 DIAGNOSIS — L97529 Non-pressure chronic ulcer of other part of left foot with unspecified severity: Secondary | ICD-10-CM | POA: Diagnosis present

## 2024-10-04 DIAGNOSIS — J4489 Other specified chronic obstructive pulmonary disease: Secondary | ICD-10-CM | POA: Diagnosis present

## 2024-10-04 DIAGNOSIS — Z89611 Acquired absence of right leg above knee: Secondary | ICD-10-CM

## 2024-10-04 DIAGNOSIS — Z950 Presence of cardiac pacemaker: Secondary | ICD-10-CM | POA: Diagnosis not present

## 2024-10-04 DIAGNOSIS — I739 Peripheral vascular disease, unspecified: Secondary | ICD-10-CM | POA: Diagnosis not present

## 2024-10-04 DIAGNOSIS — F32A Depression, unspecified: Secondary | ICD-10-CM | POA: Diagnosis present

## 2024-10-04 DIAGNOSIS — Z743 Need for continuous supervision: Secondary | ICD-10-CM | POA: Diagnosis not present

## 2024-10-04 DIAGNOSIS — Z79899 Other long term (current) drug therapy: Secondary | ICD-10-CM

## 2024-10-04 DIAGNOSIS — E039 Hypothyroidism, unspecified: Secondary | ICD-10-CM | POA: Diagnosis present

## 2024-10-04 DIAGNOSIS — F418 Other specified anxiety disorders: Secondary | ICD-10-CM | POA: Diagnosis not present

## 2024-10-04 DIAGNOSIS — I2510 Atherosclerotic heart disease of native coronary artery without angina pectoris: Secondary | ICD-10-CM | POA: Diagnosis present

## 2024-10-04 DIAGNOSIS — Z7982 Long term (current) use of aspirin: Secondary | ICD-10-CM | POA: Diagnosis not present

## 2024-10-04 DIAGNOSIS — I350 Nonrheumatic aortic (valve) stenosis: Secondary | ICD-10-CM | POA: Diagnosis present

## 2024-10-04 DIAGNOSIS — I959 Hypotension, unspecified: Secondary | ICD-10-CM | POA: Diagnosis not present

## 2024-10-04 DIAGNOSIS — S91109A Unspecified open wound of unspecified toe(s) without damage to nail, initial encounter: Secondary | ICD-10-CM | POA: Diagnosis present

## 2024-10-04 DIAGNOSIS — Z89511 Acquired absence of right leg below knee: Secondary | ICD-10-CM | POA: Diagnosis not present

## 2024-10-04 DIAGNOSIS — Z952 Presence of prosthetic heart valve: Secondary | ICD-10-CM | POA: Diagnosis not present

## 2024-10-04 DIAGNOSIS — Z87891 Personal history of nicotine dependence: Secondary | ICD-10-CM | POA: Diagnosis not present

## 2024-10-04 DIAGNOSIS — L97509 Non-pressure chronic ulcer of other part of unspecified foot with unspecified severity: Secondary | ICD-10-CM | POA: Diagnosis not present

## 2024-10-04 DIAGNOSIS — M24562 Contracture, left knee: Secondary | ICD-10-CM | POA: Diagnosis not present

## 2024-10-04 LAB — CBC WITH DIFFERENTIAL/PLATELET
Abs Immature Granulocytes: 0.01 K/uL (ref 0.00–0.07)
Basophils Absolute: 0.1 K/uL (ref 0.0–0.1)
Basophils Relative: 1 %
Eosinophils Absolute: 0.1 K/uL (ref 0.0–0.5)
Eosinophils Relative: 2 %
HCT: 36.3 % (ref 36.0–46.0)
Hemoglobin: 11.4 g/dL — ABNORMAL LOW (ref 12.0–15.0)
Immature Granulocytes: 0 %
Lymphocytes Relative: 27 %
Lymphs Abs: 1.9 K/uL (ref 0.7–4.0)
MCH: 28.2 pg (ref 26.0–34.0)
MCHC: 31.4 g/dL (ref 30.0–36.0)
MCV: 89.9 fL (ref 80.0–100.0)
Monocytes Absolute: 0.6 K/uL (ref 0.1–1.0)
Monocytes Relative: 9 %
Neutro Abs: 4.4 K/uL (ref 1.7–7.7)
Neutrophils Relative %: 61 %
Platelets: 247 K/uL (ref 150–400)
RBC: 4.04 MIL/uL (ref 3.87–5.11)
RDW: 14.9 % (ref 11.5–15.5)
WBC: 7.1 K/uL (ref 4.0–10.5)
nRBC: 0 % (ref 0.0–0.2)

## 2024-10-04 LAB — I-STAT CHEM 8, ED
BUN: 40 mg/dL — ABNORMAL HIGH (ref 8–23)
Calcium, Ion: 1.2 mmol/L (ref 1.15–1.40)
Chloride: 98 mmol/L (ref 98–111)
Creatinine, Ser: 1.3 mg/dL — ABNORMAL HIGH (ref 0.44–1.00)
Glucose, Bld: 85 mg/dL (ref 70–99)
HCT: 33 % — ABNORMAL LOW (ref 36.0–46.0)
Hemoglobin: 11.2 g/dL — ABNORMAL LOW (ref 12.0–15.0)
Potassium: 4.6 mmol/L (ref 3.5–5.1)
Sodium: 137 mmol/L (ref 135–145)
TCO2: 28 mmol/L (ref 22–32)

## 2024-10-04 LAB — PROTIME-INR
INR: 0.9 (ref 0.8–1.2)
Prothrombin Time: 12.9 s (ref 11.4–15.2)

## 2024-10-04 LAB — BASIC METABOLIC PANEL WITH GFR
Anion gap: 12 (ref 5–15)
BUN: 42 mg/dL — ABNORMAL HIGH (ref 8–23)
CO2: 26 mmol/L (ref 22–32)
Calcium: 9.1 mg/dL (ref 8.9–10.3)
Chloride: 100 mmol/L (ref 98–111)
Creatinine, Ser: 1.01 mg/dL — ABNORMAL HIGH (ref 0.44–1.00)
GFR, Estimated: 53 mL/min — ABNORMAL LOW
Glucose, Bld: 96 mg/dL (ref 70–99)
Potassium: 4.5 mmol/L (ref 3.5–5.1)
Sodium: 137 mmol/L (ref 135–145)

## 2024-10-04 LAB — APTT: aPTT: 35 s (ref 24–36)

## 2024-10-04 LAB — HEPARIN LEVEL (UNFRACTIONATED): Heparin Unfractionated: 0.8 [IU]/mL — ABNORMAL HIGH (ref 0.30–0.70)

## 2024-10-04 MED ORDER — ONDANSETRON HCL 4 MG/2ML IJ SOLN: 4.0000 mg | Freq: Four times a day (QID) | INTRAMUSCULAR | Status: DC | PRN

## 2024-10-04 MED ORDER — ROSUVASTATIN CALCIUM 20 MG PO TABS
20.0000 mg | ORAL_TABLET | Freq: Every day | ORAL | Status: DC
  Administered 2024-10-05 – 2024-10-12 (×8): 20 mg via ORAL
  Filled 2024-10-04 (×8): qty 1

## 2024-10-04 MED ORDER — HEPARIN BOLUS VIA INFUSION
3900.0000 [IU] | Freq: Once | INTRAVENOUS | Status: AC
  Administered 2024-10-04: 3900 [IU] via INTRAVENOUS

## 2024-10-04 MED ORDER — SODIUM CHLORIDE 0.9 % IV SOLN
2.0000 g | INTRAVENOUS | Status: DC
  Administered 2024-10-04 – 2024-10-09 (×5): 2 g via INTRAVENOUS
  Filled 2024-10-04 (×5): qty 20

## 2024-10-04 MED ORDER — FLEET ENEMA RE ENEM
1.0000 | ENEMA | Freq: Once | RECTAL | Status: DC
  Filled 2024-10-04: qty 1

## 2024-10-04 MED ORDER — SENNOSIDES-DOCUSATE SODIUM 8.6-50 MG PO TABS
2.0000 | ORAL_TABLET | Freq: Once | ORAL | Status: AC
  Administered 2024-10-04: 2 via ORAL
  Filled 2024-10-04: qty 2

## 2024-10-04 MED ORDER — ACETAMINOPHEN 650 MG RE SUPP: 650.0000 mg | Freq: Four times a day (QID) | RECTAL | Status: DC | PRN

## 2024-10-04 MED ORDER — ACETAMINOPHEN 325 MG PO TABS
650.0000 mg | ORAL_TABLET | Freq: Four times a day (QID) | ORAL | Status: DC | PRN
  Administered 2024-10-05 – 2024-10-10 (×3): 650 mg via ORAL
  Filled 2024-10-04 (×3): qty 2

## 2024-10-04 MED ORDER — ONDANSETRON HCL 4 MG PO TABS
4.0000 mg | ORAL_TABLET | Freq: Four times a day (QID) | ORAL | Status: DC | PRN
  Administered 2024-10-09: 4 mg via ORAL
  Filled 2024-10-04: qty 1

## 2024-10-04 MED ORDER — PANTOPRAZOLE SODIUM 40 MG PO TBEC
40.0000 mg | DELAYED_RELEASE_TABLET | Freq: Every day | ORAL | Status: DC
  Administered 2024-10-05 – 2024-10-12 (×8): 40 mg via ORAL
  Filled 2024-10-04 (×8): qty 1

## 2024-10-04 MED ORDER — IOHEXOL 350 MG/ML SOLN
100.0000 mL | Freq: Once | INTRAVENOUS | Status: AC | PRN
  Administered 2024-10-04: 100 mL via INTRAVENOUS

## 2024-10-04 MED ORDER — POLYETHYLENE GLYCOL 3350 17 G PO PACK
17.0000 g | PACK | Freq: Two times a day (BID) | ORAL | Status: AC
  Administered 2024-10-04 – 2024-10-06 (×3): 17 g via ORAL
  Filled 2024-10-04 (×3): qty 1

## 2024-10-04 MED ORDER — HEPARIN (PORCINE) 25000 UT/250ML-% IV SOLN
950.0000 [IU]/h | INTRAVENOUS | Status: DC
  Administered 2024-10-04: 1050 [IU]/h via INTRAVENOUS
  Filled 2024-10-04: qty 250

## 2024-10-04 MED ORDER — LEVOTHYROXINE SODIUM 75 MCG PO TABS
150.0000 ug | ORAL_TABLET | Freq: Every day | ORAL | Status: DC
  Administered 2024-10-06 – 2024-10-12 (×7): 150 ug via ORAL
  Filled 2024-10-04 (×7): qty 2

## 2024-10-04 MED ORDER — ACETAMINOPHEN 325 MG PO TABS
650.0000 mg | ORAL_TABLET | Freq: Once | ORAL | Status: AC
  Administered 2024-10-04: 650 mg via ORAL
  Filled 2024-10-04: qty 2

## 2024-10-04 NOTE — Progress Notes (Signed)
 PHARMACY - ANTICOAGULATION CONSULT NOTE  Pharmacy Consult for Heparin  Indication: ischemic leg  Allergies[1]  Patient Measurements: Height: 5' 9 (175.3 cm) Weight: 65 kg (143 lb 4.8 oz) IBW/kg (Calculated) : 66.2 HEPARIN  DW (KG): 65  Vital Signs: Temp: 98.3 F (36.8 C) (01/08 1512) Temp Source: Oral (01/08 1512) BP: 110/83 (01/08 1512) Pulse Rate: 70 (01/08 1512)  Labs: No results for input(s): HGB, HCT, PLT, APTT, LABPROT, INR, HEPARINUNFRC, HEPRLOWMOCWT, CREATININE, CKTOTAL, CKMB, TROPONINIHS in the last 72 hours.  CrCl cannot be calculated (Patient's most recent lab result is older than the maximum 21 days allowed.).   Medical History: Past Medical History:  Diagnosis Date   Anxiety    Arthritis    CAD (coronary artery disease)    Mild at cardiac catheterization 2018   Chronic bronchitis    Constipation    Depression    Dysrhythmia    afib 03/04/23   Hyperlipidemia    Hypokalemia 02/12/2017   Hypothyroidism    S/P TAVR (transcatheter aortic valve replacement) 05/25/2022   26mm S3UR via TF approach with Dr. Verlin and Dr. Lucas   Severe aortic stenosis    Wound of left leg 01/07/2023    Medications:  See med rec  Assessment: Patient sent to ED from rehab for left foot pain and swelling . Concern for ischemic leg with no pulse in foot.  Pharmacy asked to start heparin . Patient no on oral anticoagulants  Goal of Therapy:  Heparin  level 0.3-0.7 units/ml Monitor platelets by anticoagulation protocol: Yes   Plan:  Give 3900 units bolus x 1 Start heparin  infusion at 1050 units/hr Check anti-Xa level in ~8 hours and daily while on heparin  Continue to monitor H&H and platelets  Lasharn Bufkin, BS Pharm D, BCPS Clinical Pharmacist 10/04/2024,3:14 PM      [1] No Known Allergies

## 2024-10-04 NOTE — Progress Notes (Signed)
 PHARMACY - ANTICOAGULATION  Pharmacy Consult for Heparin  Indication: ischemic leg Brief A/P: Heparin  level supratherapeutic Decrease Heparin  rate  Allergies[1]  Patient Measurements: Height: 5' 9 (175.3 cm) Weight: 65 kg (143 lb 4.8 oz) IBW/kg (Calculated) : 66.2 HEPARIN  DW (KG): 65  Vital Signs: Temp: 98.3 F (36.8 C) (01/08 2121) Temp Source: Oral (01/08 2121) BP: 114/53 (01/08 2121) Pulse Rate: 86 (01/08 2121)  Labs: Recent Labs    10/04/24 1513 10/04/24 1537 10/04/24 2236  HGB 11.2* 11.4*  --   HCT 33.0* 36.3  --   PLT  --  247  --   APTT  --  35  --   LABPROT  --  12.9  --   INR  --  0.9  --   HEPARINUNFRC  --   --  0.80*  CREATININE 1.30* 1.01*  --     Estimated Creatinine Clearance: 38.7 mL/min (A) (by C-G formula based on SCr of 1.01 mg/dL (H)).  Assessment: 89 y.o. female with LLE ischemia for heparin   Goal of Therapy:  Heparin  level 0.3-0.7 units/ml Monitor platelets by anticoagulation protocol: Yes   Plan:  Decrease Heparin  950 units/hr Follow-up am labs.  Cathlyn Arrant, PharmD, BCPS  10/04/2024,11:27 PM       [1] No Known Allergies

## 2024-10-04 NOTE — H&P (Signed)
 " History and Physical    [name] FMW:[id number] DOB: [birth year] DOA: 10/04/2024  PCP: Orpha Yancey LABOR, MD   Patient coming from: [facility] rehab.  I have personally briefly reviewed patient's old medical records in [facility]  Chief Complaint: Left foot swelling  HPI: [name] is a 89 y.o. female with medical history significant for COPD, aortic stenosis, peripheral artery disease, right above-knee amputation. Patient presented to the ED from nursing home with reports of swelling and redness to her left lower extremity.  Patient reports this has been ongoing for several months.  She reports swelling and redness is worse when she is sitting in a wheelchair with her legs down, but then pain is better than, but when she is lying in bed the pain to her left lower extremity is worse.  She has a chronic wound to her pinky toe of her foot.  Today staff checked her feet, they could not feel a pulse so they sent her to the ED. She reports she has been having almost daily bowel movements, of mostly normal consistency.  She is supposed to be on a MiraLAX  but this was discontinued due to frequent bowel movements.  ED Course: Tmax 98.6.  Heart rate 70s to 80s.  Respiratory rate 14-20.  Blood pressure systolic 107-137.  O2 sats greater than 94% on room air. WBC 7.1. CT angio-no evidence of significant aortoiliac stenosis or occlusion, severe diffuse narrowing of the left superficial femoral artery noted secondary to extensive atheromatous disease, left popliteal artery is incompletely visualized due to scatter artifact arising from left knee arthroplasty.  Large amount of stool in the rectum concerning for impaction. EDP talked to Dr. Magda with vascular surgery, recommended admission to [facility], start heparin  drip.  Review of Systems: As per HPI all other systems reviewed and negative.  Past Medical History:  Diagnosis Date   Anxiety    Arthritis    CAD (coronary  artery disease)    Mild at cardiac catheterization 2018   Chronic bronchitis    Constipation    Depression    Dysrhythmia    afib 03/04/23   Hyperlipidemia    Hypokalemia 02/12/2017   Hypothyroidism    S/P TAVR (transcatheter aortic valve replacement) 05/25/2022   26mm S3UR via TF approach with Dr. Verlin and Dr. Lucas   Severe aortic stenosis    Wound of left leg 01/07/2023    Past Surgical History:  Procedure Laterality Date   AMPUTATION Right 03/18/2023   Procedure: RIGHT ABOVE KNEE AMPUTATION;  Surgeon: Harden Jerona GAILS, MD;  Location: [facility] OR;  Service: Orthopedics;  Laterality: Right;   APPLICATION OF WOUND VAC Right 03/18/2023   Procedure: APPLICATION OF WOUND VAC;  Surgeon: Harden Jerona GAILS, MD;  Location: [facility] OR;  Service: Orthopedics;  Laterality: Right;   BUNIONECTOMY Left 1975   CARDIAC CATHETERIZATION  07/04/2020   CHOLECYSTECTOMY N/A 02/15/2017   Procedure: LAPAROSCOPIC CHOLECYSTECTOMY;  Surgeon: Rubin Calamity, MD;  Location: [facility] OR;  Service: General;  Laterality: N/A;   CORONARY ANGIOGRAPHY N/A 02/14/2017   Procedure: Coronary Angiography;  Surgeon: Mady Bruckner, MD;  Location: [facility] INVASIVE CV LAB;  Service: Cardiovascular;  Laterality: N/A;   EYE SURGERY  2009   bilateral   HEMATOMA EVACUATION  06/04/2011   Procedure: EVACUATION HEMATOMA;  Surgeon: Taft Minerva, MD;  Location: [facility] ORS;  Service: Orthopedics;  Laterality: Right;  evacuation of seroma   INTRAOPERATIVE TRANSTHORACIC ECHOCARDIOGRAM N/A 05/25/2022   Procedure: INTRAOPERATIVE TRANSTHORACIC ECHOCARDIOGRAM;  Surgeon: Verlin Lonni BIRCH, MD;  Location: [facility] INVASIVE CV LAB;  Service: Open Heart Surgery;  Laterality: N/A;   JOINT REPLACEMENT  05/2010   left total knee Dr. Margrette   PATELLAR TENDON REPAIR  06/04/2011   Procedure: PATELLA TENDON REPAIR;  Surgeon: Taft Margrette, MD;  Location: [facility] ORS;  Service: Orthopedics;  Laterality: Right;   PATELLAR TENDON REPAIR  08/30/2011   Procedure: PATELLA  TENDON REPAIR;  Surgeon: Taft Margrette, MD;  Location: [facility] ORS;  Service: Orthopedics;  Laterality: Right;  Allograft Reconstrucation Right Patella Tendon   RIGHT/LEFT HEART CATH AND CORONARY ANGIOGRAPHY N/A 07/04/2020   Procedure: RIGHT/LEFT HEART CATH AND CORONARY ANGIOGRAPHY;  Surgeon: Verlin Lonni BIRCH, MD;  Location: [facility] INVASIVE CV LAB;  Service: Cardiovascular;  Laterality: N/A;   TOTAL HIP ARTHROPLASTY Left 1998   TOTAL KNEE ARTHROPLASTY  05/03/2011   Procedure: TOTAL KNEE ARTHROPLASTY;  Surgeon: Taft Margrette, MD;  Location: [facility] ORS;  Service: Orthopedics;  Laterality: Right;  With DePuy   TOTAL THYROIDECTOMY  11/2009   Dr. Karis @ [facility]   TRANSCATHETER AORTIC VALVE REPLACEMENT, TRANSFEMORAL Right 05/25/2022   Procedure: Transcatheter Aortic Valve Replacement, Transfemoral;  Surgeon: Verlin Lonni BIRCH, MD;  Location: [facility] INVASIVE CV LAB;  Service: Open Heart Surgery;  Laterality: Right;   TUBAL LIGATION       reports that she quit smoking about 30 years ago. Her smoking use included cigarettes. She has never used smokeless tobacco. She reports that she does not drink alcohol and does not use drugs.  Allergies[1]  Family History  Problem Relation Age of Onset   Colon cancer Mother    Blindness Sister    Anesthesia problems Neg Hx    Hypotension Neg Hx    Malignant hyperthermia Neg Hx    Pseudochol deficiency Neg Hx     Prior to Admission medications  Medication Sig Start Date End Date Taking? Authorizing Provider  acetaminophen  (TYLENOL ) 325 MG tablet Take 2 tablets (650 mg total) by mouth every 6 (six) hours as needed for mild pain (or Fever >/= 101). 01/08/23   Joshua Domino, DO  aluminum -magnesium  hydroxide-simethicone (MAALOX) 200-200-20 MG/5ML SUSP Take 30 mLs by mouth 4 (four) times daily as needed (for heartburn).    [provider]  aspirin  EC 81 MG tablet Take 81 mg by mouth daily. Swallow whole.    [provider]  furosemide  (LASIX ) 40 MG  tablet Take 1 tablet (40 mg total) by mouth daily. 03/10/23 04/05/24  Cindy Garnette POUR, MD  levothyroxine  (SYNTHROID ) 150 MCG tablet Take 150 mcg by mouth daily before breakfast.    [provider]  nitroGLYCERIN  (NITROSTAT ) 0.4 MG SL tablet Place 1 tablet (0.4 mg total) under the tongue every 5 (five) minutes as needed for chest pain. 03/09/22   Strader, Laymon HERO, PA-C  ondansetron  (ZOFRAN -ODT) 4 MG disintegrating tablet Take 4 mg by mouth every 8 (eight) hours as needed for nausea or vomiting.    [provider]  oxybutynin  (DITROPAN  XL) 15 MG 24 hr tablet Take 15 mg by mouth daily.    [provider]  pantoprazole  (PROTONIX ) 40 MG tablet Take 1 tablet (40 mg total) by mouth daily. 04/05/24   Shirlean Therisa ORN, NP  PARoxetine  (PAXIL ) 20 MG tablet Take 20 mg by mouth daily.    [provider]  polyethylene glycol (MIRALAX  / GLYCOLAX ) 17 g packet Take 17 g by mouth daily.    [provider]  rosuvastatin  (CRESTOR ) 20 MG tablet Take 1  tablet (20 mg total) by mouth daily. 08/24/23   Debera Jayson MATSU, MD  senna-docusate (SENOKOT-S) 8.6-50 MG tablet Take 1 tablet by mouth daily.    [provider]  sucralfate (CARAFATE) 1 g tablet Take 1 g by mouth 4 (four) times daily. 12/14/23   [provider]    Physical Exam: Vitals:   10/04/24 1918 10/04/24 1930 10/04/24 2015 10/04/24 2121  BP: (!) 122/48 (!) 137/116 (!) 128/115 (!) 114/53  Pulse: 72 79 76 86  Resp: 20 15 15 18   Temp:    98.3 F (36.8 C)  TempSrc:    Oral  SpO2: 94% 94% (!) 80% 98%  Weight:      Height:        Constitutional: NAD, calm, comfortable Vitals:   10/04/24 1918 10/04/24 1930 10/04/24 2015 10/04/24 2121  BP: (!) 122/48 (!) 137/116 (!) 128/115 (!) 114/53  Pulse: 72 79 76 86  Resp: 20 15 15 18   Temp:    98.3 F (36.8 C)  TempSrc:    Oral  SpO2: 94% 94% (!) 80% 98%  Weight:      Height:       Eyes: PERRL, lids and conjunctivae normal ENMT: Mucous membranes are  moist.   Neck: normal, supple, no masses, no thyromegaly Respiratory: clear to auscultation bilaterally, no wheezing, no crackles. Normal respiratory effort. No accessory muscle use.  Pace maker present 2 left upper chest. Cardiovascular: Regular rate and rhythm, no murmurs / rubs / gallops. Extremities warm Abdomen: no tenderness, no masses palpated. No hepatosplenomegaly. Bowel sounds positive.  Musculoskeletal: no clubbing / cyanosis.  Right above-knee amputation.  Skin: Trace swelling, mild erythema, differential warmth, minimal tenderness to left lower extremity, warmth extends down ankle area, mid and distal foot are cool to touch.  Small wound- 1cm, to lateral aspect of left pinky, unstageable, with black eschar.  No drainage.  Callus lateral aspect of to heel.   Neurologic: No facial asymmetry, moves extremity spontaneously, speech fluent. Psychiatric: Normal judgment and insight. Alert and oriented x 3. Normal mood.   Labs on Admission: I have personally reviewed following labs and imaging studies  CBC: Recent Labs  Lab 10/04/24 1513 10/04/24 1537  WBC  --  7.1  NEUTROABS  --  4.4  HGB 11.2* 11.4*  HCT 33.0* 36.3  MCV  --  89.9  PLT  --  247   Basic Metabolic Panel: Recent Labs  Lab 10/04/24 1513 10/04/24 1537  NA 137 137  K 4.6 4.5  CL 98 100  CO2  --  26  GLUCOSE 85 96  BUN 40* 42*  CREATININE 1.30* 1.01*  CALCIUM   --  9.1   Coagulation Profile: Recent Labs  Lab 10/04/24 1537  INR 0.9    Radiological Exams on Admission: CT Angio Aortobifemoral W and/or Wo Contrast Result Date: 10/04/2024 CLINICAL DATA:  Left lower extremity pain and swelling. No definite poles. EXAM: CT ANGIOGRAPHY OF ABDOMINAL AORTA WITH ILIOFEMORAL RUNOFF TECHNIQUE: Multidetector CT imaging of the abdomen, pelvis and lower extremities was performed using the standard protocol during bolus administration of intravenous contrast. Multiplanar CT image reconstructions and MIPs were obtained to  evaluate the vascular anatomy. RADIATION DOSE REDUCTION: This exam was performed according to the departmental dose-optimization program which includes automated exposure control, adjustment of the mA and/or kV according to patient size and/or use of iterative reconstruction technique. CONTRAST:  OMNIPAQUE  IOHEXOL  350 MG/ML SOLN COMPARISON:  None available currently. FINDINGS: VASCULAR Aorta: Aortic atherosclerosis without  aneurysm or dissection. Celiac: Patent without evidence of aneurysm, dissection, vasculitis or significant stenosis. SMA: Patent without evidence of aneurysm, dissection, vasculitis or significant stenosis. Renals: Both renal arteries are patent without evidence of aneurysm, dissection, vasculitis, fibromuscular dysplasia or significant stenosis. IMA: Patent without evidence of aneurysm, dissection, vasculitis or significant stenosis. RIGHT Lower Extremity Inflow: Common, internal and external iliac arteries are patent without evidence of aneurysm, dissection, vasculitis or significant stenosis. Outflow: The right common and profunda femoral arteries are widely patent. The right superficial femoral artery is occluded proximally. The patient is status post right above knee amputation. LEFT Lower Extremity Inflow: Common, internal and external iliac arteries are patent without evidence of aneurysm, dissection, vasculitis or significant stenosis. Outflow: The left common femoral and profundus femoral arteries are patent. Severe diffuse narrowing of the left superficial femoral artery is noted secondary to extensive atheromatous disease. The left popliteal artery is incompletely visualized due to scatter artifact arising from left knee arthroplasty. The visualized portions do appear to be patent. Runoff: Patent three vessel runoff to the left ankle. Veins: No obvious venous abnormality within the limitations of this arterial phase study. Review of the MIP images confirms the above findings.  NON-VASCULAR Lower chest: Large hiatal hernia is noted. Visualized lung bases are unremarkable. Hepatobiliary: No focal liver abnormality is seen. Status post cholecystectomy. No biliary dilatation. Pancreas: Unremarkable. No pancreatic ductal dilatation or surrounding inflammatory changes. Spleen: Normal in size without focal abnormality. Adrenals/Urinary Tract: Adrenal glands are unremarkable. Kidneys are normal, without renal calculi, focal lesion, or hydronephrosis. Bladder is unremarkable. Stomach/Bowel: There is no evidence of bowel obstruction or inflammation. Large amount of stool is noted in the rectum concerning for impaction. Lymphatic: No definite adenopathy is noted. Reproductive: Uterus and bilateral adnexa are unremarkable. Other: No ascites or hernia is noted. Musculoskeletal: Status post left total hip arthroplasty. Status post right above knee amputation as noted previously. IMPRESSION: 1. No evidence of significant aortoiliac stenosis or occlusion. 2. Severe diffuse narrowing of left superficial femoral artery is noted secondary to extensive atheromatous disease. 3. Left popliteal artery is incompletely visualized due to scatter artifact arising from left knee arthroplasty. The visualized portions do appear to be patent. 4. Status post right above knee amputation. 5. Large hiatal hernia. 6. Large amount of stool is noted in the rectum concerning for impaction. 7. Aortic atherosclerosis. Aortic Atherosclerosis (ICD10-I70.0). Electronically Signed   By: Lynwood Landy Raddle M.D.   On: 10/04/2024 17:21   EKG: Independently reviewed.  Paced rhythm.  Rate 71, QTc 475.  Assessment/Plan Principal Problem:   Peripheral arterial disease Active Problems:   Acquired hypothyroidism   Severe aortic stenosis   History of COPD   S/P AKA (above knee amputation) unilateral, right [facility])   Pacemaker  Assessment and Plan:  Peripheral artery disease-redness, differential warmth, and pain to left lower  extremity, mid and distal foot cool to touch.  Rules out for sepsis.  Afebrile.  WBC 7.1.  CTA aorta plus bifemoral- No evidence of significant aortoiliac stenosis or occlusion. Severe diffuse narrowing of left superficial femoral artery is noted secondary to extensive atheromatous disease. Left popliteal artery is incompletely visualized due to scatter artifact arising from left knee arthroplasty. The visualized portions do appear to be patent. - EDP talked with Dr. Magda, vascular surgery, heparin  drip started, will see in consult- admit to [facility] - Start IV ceftriaxone  2 g daily for possible cellulitis - N.p.o. midnight pending vascular surgery eval  Constipation- per CT- Large amount of  stool is noted in the rectum concerning for impaction.  Reports she has been having regular bowel movement.  Was on MiraLAX , discontinued due to frequent bowel movements.  She declined by doing a rectal exam and possibly stool evacuation.  - Enema -Start bowel regimen  COPD - Stable.  Hypothyroidism - Resume Synthroid   DVT prophylaxis: Heparin  Code Status: FULL -confirmed with patient and her daughter [name] at bedside. Family Communication: Confirmed with daughter [name] at bedside Disposition Plan: ~ 2 days Consults called: None  Admission status: Inpt med surg I certify that at the point of admission it is my clinical judgment that the patient will require inpatient hospital care spanning beyond 2 midnights from the point of admission due to high intensity of service, high risk for further deterioration and high frequency of surveillance required.   Author: Tully FORBES Carwin, MD 10/04/2024 9:53 PM  For on call review [email or web address].     [1] No Known Allergies  "

## 2024-10-04 NOTE — ED Provider Notes (Signed)
 " [place] EMERGENCY DEPARTMENT AT [facility] Provider Note   CSN: [id number] Arrival date & time: 10/04/24  1428     Patient presents with: Foot Pain   [name] is a 89 y.o. female.   89 year old female history of PAD and osteomyelitis of the right lower extremity status post AKA, CAD, hypertension, hyperlipidemia, and atrial fibrillation not on anticoagulation who presents to the emergency department with left foot pain.  Initially was told that she had symptoms going on for a day but upon further history taking sounds like she has had pain and a wound on her left small toe for 1 month.  They examined the wound today at her facility and were unable to palpate a pulse and told her to go to the emergency department.  States that she has been insensate in that leg for years.  Attempted to contact her facility several times without response       Prior to Admission medications  Medication Sig Start Date End Date Taking? Authorizing Provider  acetaminophen  (TYLENOL ) 325 MG tablet Take 2 tablets (650 mg total) by mouth every 6 (six) hours as needed for mild pain (or Fever >/= 101). 01/08/23   Joshua Domino, DO  aluminum -magnesium  hydroxide-simethicone (MAALOX) 200-200-20 MG/5ML SUSP Take 30 mLs by mouth 4 (four) times daily as needed (for heartburn).    [provider]  aspirin  EC 81 MG tablet Take 81 mg by mouth daily. Swallow whole.    [provider]  furosemide  (LASIX ) 40 MG tablet Take 1 tablet (40 mg total) by mouth daily. 03/10/23 04/05/24  Cindy Garnette POUR, MD  levothyroxine  (SYNTHROID ) 150 MCG tablet Take 150 mcg by mouth daily before breakfast.    [provider]  nitroGLYCERIN  (NITROSTAT ) 0.4 MG SL tablet Place 1 tablet (0.4 mg total) under the tongue every 5 (five) minutes as needed for chest pain. 03/09/22   Strader, Laymon HERO, PA-C  ondansetron  (ZOFRAN -ODT) 4 MG disintegrating tablet Take 4 mg by mouth every 8 (eight) hours as needed  for nausea or vomiting.    [provider]  oxybutynin  (DITROPAN  XL) 15 MG 24 hr tablet Take 15 mg by mouth daily.    [provider]  pantoprazole  (PROTONIX ) 40 MG tablet Take 1 tablet (40 mg total) by mouth daily. 04/05/24   Shirlean Therisa ORN, NP  PARoxetine  (PAXIL ) 20 MG tablet Take 20 mg by mouth daily.    [provider]  polyethylene glycol (MIRALAX  / GLYCOLAX ) 17 g packet Take 17 g by mouth daily.    [provider]  rosuvastatin  (CRESTOR ) 20 MG tablet Take 1 tablet (20 mg total) by mouth daily. 08/24/23   Debera Jayson MATSU, MD  senna-docusate (SENOKOT-S) 8.6-50 MG tablet Take 1 tablet by mouth daily.    [provider]  sucralfate (CARAFATE) 1 g tablet Take 1 g by mouth 4 (four) times daily. 12/14/23   [provider]    Allergies: Patient has no known allergies.    Review of Systems  Updated Vital Signs BP (!) 122/48 (BP Location: Left Arm)   Pulse 72   Temp 98.6 F (37 C) (Oral)   Resp 20   Ht 5' 9 (1.753 m)   Wt 65 kg   SpO2 94%   BMI 21.16 kg/m   Physical Exam Constitutional:      Appearance: Normal appearance.  Musculoskeletal:     Comments: Unable to palpate PT or DP pulse and left lower extremity.  It  appears pale.  Capillary refill approximately 4 seconds in all toes of the left foot.  Left pinky toe does have an eschar over it.  Unable to Doppler pulse in that foot either.  Left femoral pulse present.  No sensation in the left foot.  Able to wiggle all toes of the left foot.  Neurological:     Mental Status: She is alert.     (all labs ordered are listed, but only abnormal results are displayed) Labs Reviewed  CBC WITH DIFFERENTIAL/PLATELET - Abnormal; Notable for the following components:      Result Value   Hemoglobin 11.4 (*)    All other components within normal limits  BASIC METABOLIC PANEL WITH GFR - Abnormal; Notable for the following components:   BUN 42 (*)    Creatinine, Ser 1.01 (*)    GFR,  Estimated 53 (*)    All other components within normal limits  I-STAT CHEM 8, ED - Abnormal; Notable for the following components:   BUN 40 (*)    Creatinine, Ser 1.30 (*)    Hemoglobin 11.2 (*)    HCT 33.0 (*)    All other components within normal limits  PROTIME-INR  APTT  HEPARIN  LEVEL (UNFRACTIONATED)  HEPARIN  LEVEL (UNFRACTIONATED)  CBC    EKG: EKG Interpretation Date/Time:  Thursday October 04 2024 15:16:00 EST Ventricular Rate:  71 PR Interval:  191 QRS Duration:  132 QT Interval:  437 QTC Calculation: 475 R Axis:   -26  Text Interpretation: AV sequential or dual chamber electronic pacemaker Inferior infarct, old Confirmed by Yolande Charleston [phone]) on 10/04/2024 3:41:37 PM  Radiology: CT Angio Aortobifemoral W and/or Wo Contrast Result Date: 10/04/2024 CLINICAL DATA:  Left lower extremity pain and swelling. No definite poles. EXAM: CT ANGIOGRAPHY OF ABDOMINAL AORTA WITH ILIOFEMORAL RUNOFF TECHNIQUE: Multidetector CT imaging of the abdomen, pelvis and lower extremities was performed using the standard protocol during bolus administration of intravenous contrast. Multiplanar CT image reconstructions and MIPs were obtained to evaluate the vascular anatomy. RADIATION DOSE REDUCTION: This exam was performed according to the departmental dose-optimization program which includes automated exposure control, adjustment of the mA and/or kV according to patient size and/or use of iterative reconstruction technique. CONTRAST:  OMNIPAQUE  IOHEXOL  350 MG/ML SOLN COMPARISON:  None available currently. FINDINGS: VASCULAR Aorta: Aortic atherosclerosis without aneurysm or dissection. Celiac: Patent without evidence of aneurysm, dissection, vasculitis or significant stenosis. SMA: Patent without evidence of aneurysm, dissection, vasculitis or significant stenosis. Renals: Both renal arteries are patent without evidence of aneurysm, dissection, vasculitis, fibromuscular dysplasia or significant  stenosis. IMA: Patent without evidence of aneurysm, dissection, vasculitis or significant stenosis. RIGHT Lower Extremity Inflow: Common, internal and external iliac arteries are patent without evidence of aneurysm, dissection, vasculitis or significant stenosis. Outflow: The right common and profunda femoral arteries are widely patent. The right superficial femoral artery is occluded proximally. The patient is status post right above knee amputation. LEFT Lower Extremity Inflow: Common, internal and external iliac arteries are patent without evidence of aneurysm, dissection, vasculitis or significant stenosis. Outflow: The left common femoral and profundus femoral arteries are patent. Severe diffuse narrowing of the left superficial femoral artery is noted secondary to extensive atheromatous disease. The left popliteal artery is incompletely visualized due to scatter artifact arising from left knee arthroplasty. The visualized portions do appear to be patent. Runoff: Patent three vessel runoff to the left ankle. Veins: No obvious venous abnormality within the limitations of this arterial phase study. Review of the MIP  images confirms the above findings. NON-VASCULAR Lower chest: Large hiatal hernia is noted. Visualized lung bases are unremarkable. Hepatobiliary: No focal liver abnormality is seen. Status post cholecystectomy. No biliary dilatation. Pancreas: Unremarkable. No pancreatic ductal dilatation or surrounding inflammatory changes. Spleen: Normal in size without focal abnormality. Adrenals/Urinary Tract: Adrenal glands are unremarkable. Kidneys are normal, without renal calculi, focal lesion, or hydronephrosis. Bladder is unremarkable. Stomach/Bowel: There is no evidence of bowel obstruction or inflammation. Large amount of stool is noted in the rectum concerning for impaction. Lymphatic: No definite adenopathy is noted. Reproductive: Uterus and bilateral adnexa are unremarkable. Other: No ascites or hernia  is noted. Musculoskeletal: Status post left total hip arthroplasty. Status post right above knee amputation as noted previously. IMPRESSION: 1. No evidence of significant aortoiliac stenosis or occlusion. 2. Severe diffuse narrowing of left superficial femoral artery is noted secondary to extensive atheromatous disease. 3. Left popliteal artery is incompletely visualized due to scatter artifact arising from left knee arthroplasty. The visualized portions do appear to be patent. 4. Status post right above knee amputation. 5. Large hiatal hernia. 6. Large amount of stool is noted in the rectum concerning for impaction. 7. Aortic atherosclerosis. Aortic Atherosclerosis (ICD10-I70.0). Electronically Signed   By: Lynwood Landy Raddle M.D.   On: 10/04/2024 17:21     Procedures   Medications Ordered in the ED  heparin  bolus via infusion 3,900 Units (3,900 Units Intravenous Bolus from Bag 10/04/24 1605)    Followed by  heparin  ADULT infusion 100 units/mL (25000 units/250mL) (1,050 Units/hr Intravenous New Bag/Given 10/04/24 1605)  iohexol  (OMNIPAQUE ) 350 MG/ML injection 100 mL (100 mLs Intravenous Contrast Given 10/04/24 1530)    Clinical Course as of 10/04/24 2026  Thu Oct 04, 2024  1535 Discussed with Dr Magda from vascular surgery.  Recommends continue the patient on heparin  and transferring down to [facility] after the CTA.  Recommends admission to hospitalist [RP]    Clinical Course User Index [RP] Yolande Lamar BROCKS, MD                                 Medical Decision Making Amount and/or Complexity of Data Reviewed Labs: ordered. Radiology: ordered.  Risk Prescription drug management. Decision regarding hospitalization.   89 year old female history of PAD and osteomyelitis of the right lower extremity status post AKA, CAD, hypertension, hyperlipidemia, and atrial fibrillation not on anticoagulation who presents to the emergency department with left foot pain.   Initial Ddx:  Limb ischemia,  gangrene, osteomyelitis, cellulitis, infection  MDM/Course:  Patient presents to the emergency department with what appears to be subacute limb ischemia of her left lower extremity.  I suspect the area on her small toe is dry gangrene that is starting to form.  No pulses on exam.  Does have delayed cap refill in the foot as well.  Talked to vascular surgery who recommended starting her on heparin  and transferring over to [facility] after the CTA which they suspect will show significant PAD.  Upon re-evaluation patient remains comfortable.  Heparin  started.  Signed out to the oncoming physician awaiting the results of her CT scan  This patient presents to the ED for concern of complaints listed in HPI, this involves an extensive number of treatment options, and is a complaint that carries with it a high risk of complications and morbidity. Disposition including potential need for admission considered.   Dispo: Pending remainder of workup  I have reviewed  the patients home medications and made adjustments as needed Records reviewed Outpatient Clinic Notes The following labs were independently interpreted: Chemistry and show no acute abnormality Social Determinants of health:  Geriatric  CRITICAL CARE Performed by: Lamar JAYSON Shan   Total critical care time: 30 minutes  Critical care time was exclusive of separately billable procedures and treating other patients.  Critical care was necessary to treat or prevent imminent or life-threatening deterioration.  Critical care was time spent personally by me on the following activities: development of treatment plan with patient and/or surrogate as well as nursing, discussions with consultants, evaluation of patient's response to treatment, examination of patient, obtaining history from patient or surrogate, ordering and performing treatments and interventions, ordering and review of laboratory studies, ordering and review of radiographic studies,  pulse oximetry and re-evaluation of patient's condition.   Portions of this note were generated with Scientist, clinical (histocompatibility and immunogenetics). Dictation errors may occur despite best attempts at proofreading.    Final diagnoses:  Limb ischemia  Open wound of toe of left foot    ED Discharge Orders     None          Shan Lamar JAYSON, MD 10/04/24 2026  "

## 2024-10-04 NOTE — ED Triage Notes (Signed)
 Pt in by [facility] from [facility]. Pt was sent for left foot swelling and staff being unable to find a pulse in the foot.

## 2024-10-04 NOTE — Nursing Note (Signed)
 Resident was assessed for left foot concerns after notification from the floor nurse. The left foot was red, cool to touch, with no palpable pedal pulse. Findings included a fluid-filled blister on the left great toe and a blackened area lateral to the pinky toe. She reported pain extending from the blackened area to midway up the foot. The MD was notified, and orders were given to send her to [facility] ED for evaluation of possible gangrene. Responsible party [name] was informed. EMS was notified and arrived at 1400; report, paperwork, and pacemaker information were provided to EMS. Report was also called to [place] at [facility] ED.

## 2024-10-05 DIAGNOSIS — L97529 Non-pressure chronic ulcer of other part of left foot with unspecified severity: Secondary | ICD-10-CM | POA: Diagnosis not present

## 2024-10-05 DIAGNOSIS — I739 Peripheral vascular disease, unspecified: Secondary | ICD-10-CM | POA: Diagnosis not present

## 2024-10-05 DIAGNOSIS — I70245 Atherosclerosis of native arteries of left leg with ulceration of other part of foot: Secondary | ICD-10-CM | POA: Diagnosis not present

## 2024-10-05 DIAGNOSIS — Z89511 Acquired absence of right leg below knee: Secondary | ICD-10-CM | POA: Diagnosis not present

## 2024-10-05 LAB — BASIC METABOLIC PANEL WITH GFR
Anion gap: 9 (ref 5–15)
BUN: 38 mg/dL — ABNORMAL HIGH (ref 8–23)
CO2: 27 mmol/L (ref 22–32)
Calcium: 8.8 mg/dL — ABNORMAL LOW (ref 8.9–10.3)
Chloride: 104 mmol/L (ref 98–111)
Creatinine, Ser: 0.97 mg/dL (ref 0.44–1.00)
GFR, Estimated: 56 mL/min — ABNORMAL LOW
Glucose, Bld: 115 mg/dL — ABNORMAL HIGH (ref 70–99)
Potassium: 3.9 mmol/L (ref 3.5–5.1)
Sodium: 140 mmol/L (ref 135–145)

## 2024-10-05 LAB — CBC
HCT: 33 % — ABNORMAL LOW (ref 36.0–46.0)
Hemoglobin: 10.8 g/dL — ABNORMAL LOW (ref 12.0–15.0)
MCH: 28.3 pg (ref 26.0–34.0)
MCHC: 32.7 g/dL (ref 30.0–36.0)
MCV: 86.6 fL (ref 80.0–100.0)
Platelets: 220 K/uL (ref 150–400)
RBC: 3.81 MIL/uL — ABNORMAL LOW (ref 3.87–5.11)
RDW: 14.9 % (ref 11.5–15.5)
WBC: 7 K/uL (ref 4.0–10.5)
nRBC: 0 % (ref 0.0–0.2)

## 2024-10-05 LAB — HEPARIN LEVEL (UNFRACTIONATED)
Heparin Unfractionated: 0.11 [IU]/mL — ABNORMAL LOW (ref 0.30–0.70)
Heparin Unfractionated: 0.7 [IU]/mL (ref 0.30–0.70)

## 2024-10-05 MED ORDER — HEPARIN (PORCINE) 25000 UT/250ML-% IV SOLN
950.0000 [IU]/h | INTRAVENOUS | Status: AC
  Administered 2024-10-05 (×2): 1000 [IU]/h via INTRAVENOUS
  Administered 2024-10-06 – 2024-10-07 (×2): 950 [IU]/h via INTRAVENOUS
  Filled 2024-10-05 (×3): qty 250

## 2024-10-05 MED ORDER — HEPARIN BOLUS VIA INFUSION
2000.0000 [IU] | Freq: Once | INTRAVENOUS | Status: AC
  Administered 2024-10-05: 2000 [IU] via INTRAVENOUS
  Filled 2024-10-05: qty 2000

## 2024-10-05 MED ORDER — IPRATROPIUM-ALBUTEROL 0.5-2.5 (3) MG/3ML IN SOLN: 3.0000 mL | Freq: Four times a day (QID) | RESPIRATORY_TRACT | Status: DC | PRN

## 2024-10-05 NOTE — Progress Notes (Signed)
 " Progress Note   Patient: [name] FMW:[id number] DOB: 1932-11-05 DOA: 10/04/2024     1 DOS: the patient was seen and examined on 10/05/2024    Brief hospital course: [name] is a 89 y.o. female with medical history significant for COPD, aortic stenosis, peripheral artery disease, right above-knee amputation. Patient presented to the ED from nursing home with reports of swelling and redness to her left lower extremity.  Patient reports this has been ongoing for several months.   CT angio-no evidence of significant aortoiliac stenosis or occlusion, severe diffuse narrowing of the left superficial femoral artery noted secondary to extensive atheromatous disease, left popliteal artery is incompletely visualized due to scatter artifact arising from left knee arthroplasty.  Large amount of stool in the rectum concerning for impaction. EDP talked to Dr. Magda with vascular surgery, recommended admission to [facility], start heparin  drip. Vascular surgeon following    Assessment and Plan:   Left foot cellulitis Patient was started on ceftriaxone  on admission, we will continue Continue with a limp elevation  Peripheral artery disease- CTA aorta plus bifemoral- No evidence of significant aortoiliac stenosis or occlusion. Severe diffuse narrowing of left superficial femoral artery is noted secondary to extensive atheromatous disease. Left popliteal artery is incompletely visualized due to scatter artifact arising from left knee arthroplasty.  On heparin  drip To check ABI as well as L LE arterial duplex According to vascular surgeon patient is not a candidate for revascularization   Constipation Continue as needed bowel regiment   COPD - Stable. Continue as needed nebulization   Hypothyroidism - Resume Synthroid    DVT prophylaxis: Heparin   Code Status: FULL   Family Communication: Confirmed with daughter [name] at bedside  Disposition Plan: PT OT  requested  Consults called: Vascular  Subjective:  Patient seen and examined at bedside this morning Admits to improvement in left lower extremity redness Denies pain cough nausea vomiting  Physical Exam: General: Elderly female laying in bed in no acute distress Respiratory: clear to auscultation bilaterally, no wheezing, no crackles. Normal respiratory effort. No accessory muscle use.  Pace maker present 2 left upper chest. Cardiovascular: Regular rate and rhythm, no murmurs / rubs / gallops. Extremities warm Abdomen: no tenderness, no masses palpated. No hepatosplenomegaly. Bowel sounds positive.  Musculoskeletal: no clubbing / cyanosis.  Right above-knee amputation.  Skin: Trace swelling, mild erythema, differential warmth, minimal tenderness to left lower extremity, warmth extends down ankle area, mid and distal foot are cool to touch.  Small wound- 1cm, to lateral aspect of left big toe Neurologic: No facial asymmetry, moves extremity spontaneously, speech fluent. Psychiatric: Normal judgment and insight. Alert and oriented x 3. Normal mood.   Vitals:   10/04/24 2015 10/04/24 2121 10/05/24 0426 10/05/24 0837  BP: (!) 128/115 (!) 114/53 (!) 104/58 (!) 110/54  Pulse: 76 86 62 65  Resp: 15 18 17 16   Temp:  98.3 F (36.8 C) 98 F (36.7 C)   TempSrc:  Oral Oral   SpO2: (!) 80% 98% 95% 94%  Weight:      Height:        Data Reviewed: CT angio of the left lower extremity showing findings of severe diffuse narrowing of the left superficial femoral artery and other findings as noted above     Latest Ref Rng & Units 10/05/2024    5:51 AM 10/04/2024    3:37 PM 10/04/2024    3:13 PM  CBC  WBC 4.0 - 10.5 K/uL 7.0  7.1  Hemoglobin 12.0 - 15.0 g/dL 89.1  88.5  88.7   Hematocrit 36.0 - 46.0 % 33.0  36.3  33.0   Platelets 150 - 400 K/uL 220  247         Latest Ref Rng & Units 10/05/2024    5:51 AM 10/04/2024    3:37 PM 10/04/2024    3:13 PM  BMP  Glucose 70 - 99 mg/dL 884  96  85   BUN  8 - 23 mg/dL 38  42  40   Creatinine 0.44 - 1.00 mg/dL 9.02  8.98  8.69   Sodium 135 - 145 mmol/L 140  137  137   Potassium 3.5 - 5.1 mmol/L 3.9  4.5  4.6   Chloride 98 - 111 mmol/L 104  100  98   CO2 22 - 32 mmol/L 27  26    Calcium  8.9 - 10.3 mg/dL 8.8  9.1       Author: Drue ONEIDA Potter, MD 10/05/2024 1:59 PM  For on call review [email or web address].  "

## 2024-10-05 NOTE — Plan of Care (Signed)
   Problem: Education: Goal: Knowledge of General Education information will improve Description Including pain rating scale, medication(s)/side effects and non-pharmacologic comfort measures Outcome: Progressing

## 2024-10-05 NOTE — Progress Notes (Signed)
 PHARMACY - ANTICOAGULATION  Pharmacy Consult for Heparin  Indication: ischemic leg   Allergies[1]  Patient Measurements: Height: 5' 9 (175.3 cm) Weight: 65 kg (143 lb 4.8 oz) IBW/kg (Calculated) : 66.2 HEPARIN  DW (KG): 65  Vital Signs: Temp: 98 F (36.7 C) (01/09 0426) Temp Source: Oral (01/09 0426) BP: 110/54 (01/09 0837) Pulse Rate: 65 (01/09 0837)  Labs: Recent Labs    10/04/24 1513 10/04/24 1537 10/04/24 2236 10/05/24 0551 10/05/24 1005  HGB 11.2* 11.4*  --  10.8*  --   HCT 33.0* 36.3  --  33.0*  --   PLT  --  247  --  220  --   APTT  --  35  --   --   --   LABPROT  --  12.9  --   --   --   INR  --  0.9  --   --   --   HEPARINUNFRC  --   --  0.80*  --  0.11*  CREATININE 1.30* 1.01*  --  0.97  --     Estimated Creatinine Clearance: 40.3 mL/min (by C-G formula based on SCr of 0.97 mg/dL).  Assessment: 89 y.o. female with PAD and R AKA presenting with LLE ischemia. No anticoagulation prior to admission. Pharmacy consulted for heparin .    Heparin  level 0.11 is subtherapeutic on 950 units/hr.  Per RN, no issue with infusion when in room at 8:30 but pump was beeping for unknown time around 10:45.   Goal of Therapy:  Heparin  level 0.3-0.7 units/ml Monitor platelets by anticoagulation protocol: Yes   Plan:  Bolus heparin  2000 units x1 then increase heparin  to 1000 units/hr F/u 8hr heparin  level Monitor daily heparin  level, CBC, signs/symptoms of bleeding  F/u VVS plans   Jinnie Door, PharmD, BCPS, [facility] Clinical Pharmacist  Please check AMION for all [facility] Pharmacy phone numbers After 10:00 PM, call Main Pharmacy [phone]      [1] No Known Allergies

## 2024-10-05 NOTE — Progress Notes (Signed)
 PHARMACY - ANTICOAGULATION  Pharmacy Consult for Heparin  Indication: ischemic leg   Allergies[1]  Patient Measurements: Height: 5' 9 (175.3 cm) Weight: 65 kg (143 lb 4.8 oz) IBW/kg (Calculated) : 66.2 HEPARIN  DW (KG): 65  Vital Signs: Temp: 97.5 F (36.4 C) (01/09 1959) Temp Source: Oral (01/09 1959) BP: 116/38 (01/09 1959) Pulse Rate: 62 (01/09 1959)  Labs: Recent Labs    10/04/24 1513 10/04/24 1537 10/04/24 2236 10/05/24 0551 10/05/24 1005 10/05/24 1924  HGB 11.2* 11.4*  --  10.8*  --   --   HCT 33.0* 36.3  --  33.0*  --   --   PLT  --  247  --  220  --   --   APTT  --  35  --   --   --   --   LABPROT  --  12.9  --   --   --   --   INR  --  0.9  --   --   --   --   HEPARINUNFRC  --   --  0.80*  --  0.11* 0.70  CREATININE 1.30* 1.01*  --  0.97  --   --     Estimated Creatinine Clearance: 40.3 mL/min (by C-G formula based on SCr of 0.97 mg/dL).  Assessment: 89 y.o. female with PAD and R AKA presenting with LLE ischemia. No anticoagulation prior to admission. Pharmacy consulted for heparin .    1/9 PM Heparin  level 0.70 is at top of therapeutic range, probably still seeing effects of the bolus. Will check confirmatory level in the AM.  Goal of Therapy:  Heparin  level 0.3-0.7 units/ml Monitor platelets by anticoagulation protocol: Yes   Plan:  Continue heparin  at 1000 units/hr Monitor daily heparin  level, CBC, signs/symptoms of bleeding  F/u VVS plans   Larraine Brazier, PharmD Clinical Pharmacist 10/05/2024  8:06 PM **Pharmacist phone directory can now be found on [email or web address] ([email or web address]).  Listed under [facility].         [1] No Known Allergies

## 2024-10-05 NOTE — Consult Note (Signed)
 [facility] OF [place]  ASSESSMENT / PLAN: 89 y.o. female with ischemic ulceration of left fifth toe.  Check ABI and LLE arterial duplex.   Hopeful that she can heal without intervention. I do not think she is a great revascularization candidate.  CHIEF COMPLAINT: left foot ulcer  HISTORY OF PRESENT ILLNESS: [name] is a 89 y.o. female with left foot mild ulceration of fifth toe. The patient reports she uses her leg to transfer. When I saw her last in 2024, she had chronic flexion contractures of the bilateral lower extremities and was non-ambulatory. She underwent a right above knee amputation with Dr. Harden in June 2024. The foot is not painful.   Past Medical History:  Diagnosis Date   Anxiety    Arthritis    CAD (coronary artery disease)    Mild at cardiac catheterization 2018   Chronic bronchitis    Constipation    Depression    Dysrhythmia    afib 03/04/23   Hyperlipidemia    Hypokalemia 02/12/2017   Hypothyroidism    S/P TAVR (transcatheter aortic valve replacement) 05/25/2022   26mm S3UR via TF approach with Dr. Verlin and Dr. Lucas   Severe aortic stenosis    Wound of left leg 01/07/2023    Past Surgical History:  Procedure Laterality Date   AMPUTATION Right 03/18/2023   Procedure: RIGHT ABOVE KNEE AMPUTATION;  Surgeon: Harden Jerona GAILS, MD;  Location: [facility] OR;  Service: Orthopedics;  Laterality: Right;   APPLICATION OF WOUND VAC Right 03/18/2023   Procedure: APPLICATION OF WOUND VAC;  Surgeon: Harden Jerona GAILS, MD;  Location: [facility] OR;  Service: Orthopedics;  Laterality: Right;   BUNIONECTOMY Left 1975   CARDIAC CATHETERIZATION  07/04/2020   CHOLECYSTECTOMY N/A 02/15/2017   Procedure: LAPAROSCOPIC CHOLECYSTECTOMY;  Surgeon: Rubin Calamity, MD;  Location: [facility] OR;  Service: General;  Laterality: N/A;   CORONARY ANGIOGRAPHY N/A 02/14/2017   Procedure: Coronary Angiography;  Surgeon: Mady Bruckner, MD;  Location: [facility] INVASIVE CV LAB;   Service: Cardiovascular;  Laterality: N/A;   EYE SURGERY  2009   bilateral   HEMATOMA EVACUATION  06/04/2011   Procedure: EVACUATION HEMATOMA;  Surgeon: Taft Minerva, MD;  Location: [facility] ORS;  Service: Orthopedics;  Laterality: Right;  evacuation of seroma   INTRAOPERATIVE TRANSTHORACIC ECHOCARDIOGRAM N/A 05/25/2022   Procedure: INTRAOPERATIVE TRANSTHORACIC ECHOCARDIOGRAM;  Surgeon: Verlin Bruckner BIRCH, MD;  Location: [facility];  Service: Open Heart Surgery;  Laterality: N/A;   JOINT REPLACEMENT  05/2010   left total knee Dr. Minerva   PATELLAR TENDON REPAIR  06/04/2011   Procedure: PATELLA TENDON REPAIR;  Surgeon: Taft Minerva, MD;  Location: [facility] ORS;  Service: Orthopedics;  Laterality: Right;   PATELLAR TENDON REPAIR  08/30/2011   Procedure: PATELLA TENDON REPAIR;  Surgeon: Taft Minerva, MD;  Location: [facility]S;  Service: Orthopedics;  Laterality: Right;  Allograft Reconstrucation Right Patella Tendon   RIGHT/LEFT HEART CATH AND CORONARY ANGIOGRAPHY N/A 07/04/2020   Procedure: RIGHT/LEFT HEART CATH AND CORONARY ANGIOGRAPHY;  Surgeon: Verlin Bruckner BIRCH, MD;  Location: [facility];  Service: Cardiovascular;  Laterality: N/A;   TOTAL HIP ARTHROPLASTY Left 1998   TOTAL KNEE ARTHROPLASTY  05/03/2011   Procedure: TOTAL KNEE ARTHROPLASTY;  Surgeon: Taft Minerva, MD;  Location: [facility];  Service: Orthopedics;  Laterality: Right;  With DePuy   TOTAL THYROIDECTOMY  11/2009   Dr. Karis @ [facility]   TRANSCATHETER AORTIC VALVE REPLACEMENT, TRANSFEMORAL Right 05/25/2022   Procedure: Transcatheter Aortic  Valve Replacement, Transfemoral;  Surgeon: Verlin Lonni BIRCH, MD;  Location: [facility];  Service: Open Heart Surgery;  Laterality: Right;   TUBAL LIGATION      Family History  Problem Relation Age of Onset   Colon cancer Mother    Blindness Sister    Anesthesia problems Neg Hx    Hypotension Neg Hx    Malignant hyperthermia Neg Hx    Pseudochol  deficiency Neg Hx     Social History   Socioeconomic History   Marital status: Widowed    Spouse name: Not on file   Number of children: 2   Years of education: Not on file   Highest education level: Not on file  Occupational History   Occupation: Retired-Office work  Tobacco Use   Smoking status: Former    Current packs/day: 0.00    Types: Cigarettes    Quit date: 09/27/1994    Years since quitting: 30.0   Smokeless tobacco: Never  Vaping Use   Vaping status: Never Used  Substance and Sexual Activity   Alcohol use: No   Drug use: No   Sexual activity: Not Currently  Other Topics Concern   Not on file  Social History Narrative   ** Merged History Encounter **       Social Drivers of Health   Tobacco Use: Medium Risk (10/04/2024)   Patient History    Smoking Tobacco Use: Former    Smokeless Tobacco Use: Never    Passive Exposure: Not on Actuary Strain: Low Risk (06/05/2024)   Received from [facility]   Overall Financial Resource Strain (CARDIA)    How hard is it for you to pay for the very basics like food, housing, medical care, and heating?: Not very hard  Food Insecurity: No Food Insecurity (10/04/2024)   Epic    Worried About Radiation Protection Practitioner of Food in the Last Year: Never true    Ran Out of Food in the Last Year: Never true  Transportation Needs: No Transportation Needs (10/04/2024)   Epic    Lack of Transportation (Medical): No    Lack of Transportation (Non-Medical): No  Physical Activity: Insufficiently Active (05/26/2023)   Received from [facility]   Exercise Vital Sign    On average, how many days per week do you engage in moderate to strenuous exercise (like a brisk walk)?: 4 days    On average, how many minutes do you engage in exercise at this level?: 30 min  Stress: No Stress Concern Present (05/26/2023)   Received from [facility] - Occupational Stress Questionnaire    Feeling of Stress  : Only a little  Social Connections: Moderately Isolated (10/05/2024)   Social Connection and Isolation Panel    Frequency of Communication with Friends and Family: Twice a week    Frequency of Social Gatherings with Friends and Family: Once a week    Attends Religious Services: 1 to 4 times per year    Active Member of [employer] or Organizations: No    Attends Banker Meetings: Never    Marital Status: Widowed  Intimate Partner Violence: Not At Risk (10/04/2024)   Epic    Fear of Current or Ex-Partner: No    Emotionally Abused: No    Physically Abused: No    Sexually Abused: No  Depression (PHQ2-9): Not on file  Alcohol Screen: Not on file  Housing: Low Risk (10/04/2024)  Epic    Unable to Pay for Housing in the Last Year: No    Number of Times Moved in the Last Year: 0    Homeless in the Last Year: No  Utilities: Not At Risk (10/04/2024)   Epic    Threatened with loss of utilities: No  Health Literacy: Low Risk (12/12/2023)   Received [facility]    How often do you need to have someone help you when you read instructions, pamphlets, or other written material from your doctor or pharmacy?: Never    Allergies[1]  Current Facility-Administered Medications  Medication Dose Route Frequency Provider Last Rate Last Admin   acetaminophen  (TYLENOL ) tablet 650 mg  650 mg Oral Q6H PRN Emokpae, Ejiroghene E, MD   650 mg at 10/05/24 1031   Or   acetaminophen  (TYLENOL ) suppository 650 mg  650 mg Rectal Q6H PRN Emokpae, Ejiroghene E, MD       cefTRIAXone  (ROCEPHIN ) 2 g in sodium chloride  0.9 % 100 mL IVPB  2 g Intravenous Q24H Emokpae, Ejiroghene E, MD 200 mL/hr at 10/04/24 2327 2 g at 10/04/24 2327   heparin  ADULT infusion 100 units/mL (25000 units/215mL)  1,000 Units/hr Intravenous Continuous Chen, Lydia D, RPH 10 mL/hr at 10/05/24 1628 1,000 Units/hr at 10/05/24 1628   levothyroxine  (SYNTHROID ) tablet 150 mcg  150 mcg Oral QAC breakfast Emokpae, Ejiroghene  E, MD       ondansetron  (ZOFRAN ) tablet 4 mg  4 mg Oral Q6H PRN Emokpae, Ejiroghene E, MD       Or   ondansetron  (ZOFRAN ) injection 4 mg  4 mg Intravenous Q6H PRN Emokpae, Ejiroghene E, MD       pantoprazole  (PROTONIX ) EC tablet 40 mg  40 mg Oral Daily Emokpae, Ejiroghene E, MD   40 mg at 10/05/24 1030   polyethylene glycol (MIRALAX  / GLYCOLAX ) packet 17 g  17 g Oral BID Emokpae, Ejiroghene E, MD   17 g at 10/04/24 2236   rosuvastatin  (CRESTOR ) tablet 20 mg  20 mg Oral Daily Emokpae, Ejiroghene E, MD   20 mg at 10/05/24 1030   sodium phosphate  (FLEET) enema 1 enema  1 enema Rectal Once Emokpae, Ejiroghene E, MD        PHYSICAL EXAM Vitals:   10/04/24 2121 10/05/24 0426 10/05/24 0837 10/05/24 1509  BP: (!) 114/53 (!) 104/58 (!) 110/54 (!) 106/47  Pulse: 86 62 65 66  Resp: 18 17 16 16   Temp: 98.3 F (36.8 C) 98 F (36.7 C)  98 F (36.7 C)  TempSrc: Oral Oral    SpO2: 98% 95% 94% 98%  Weight:      Height:       No distress R AKA L fifth toe with mild ischemic dorsal ulcer No pedal pulse palpable  PERTINENT LABORATORY AND RADIOLOGIC DATA  Most recent CBC    Latest Ref Rng & Units 10/05/2024    5:51 AM 10/04/2024    3:37 PM 10/04/2024    3:13 PM  CBC  WBC 4.0 - 10.5 K/uL 7.0  7.1    Hemoglobin 12.0 - 15.0 g/dL 89.1  88.5  88.7   Hematocrit 36.0 - 46.0 % 33.0  36.3  33.0   Platelets 150 - 400 K/uL 220  247       Most recent CMP    Latest Ref Rng & Units 10/05/2024    5:51 AM 10/04/2024    3:37 PM 10/04/2024    3:13 PM  CMP  Glucose 70 - 99 mg/dL 884  96  85   BUN 8 - 23 mg/dL 38  42  40   Creatinine 0.44 - 1.00 mg/dL 9.02  8.98  8.69   Sodium 135 - 145 mmol/L 140  137  137   Potassium 3.5 - 5.1 mmol/L 3.9  4.5  4.6   Chloride 98 - 111 mmol/L 104  100  98   CO2 22 - 32 mmol/L 27  26    Calcium  8.9 - 10.3 mg/dL 8.8  9.1      Renal function Estimated Creatinine Clearance: 40.3 mL/min (by C-G formula based on SCr of 0.97 mg/dL).  Hgb A1c MFr Bld (%)  Date Value   01/07/2023 5.1    Debby SAILOR. Magda, MD [facility] of [facility] Phone Number: [phone] 10/05/2024 4:28 PM   Total time spent on preparing this encounter including chart review, data review, collecting history, examining the patient, and coordinating care: 60 min  Portions of this report may have been transcribed using voice recognition software.  Every effort has been made to ensure accuracy; however, inadvertent computerized transcription errors may still be present.       [1] No Known Allergies

## 2024-10-06 ENCOUNTER — Inpatient Hospital Stay (HOSPITAL_COMMUNITY)

## 2024-10-06 DIAGNOSIS — I70245 Atherosclerosis of native arteries of left leg with ulceration of other part of foot: Secondary | ICD-10-CM | POA: Diagnosis not present

## 2024-10-06 DIAGNOSIS — L97529 Non-pressure chronic ulcer of other part of left foot with unspecified severity: Secondary | ICD-10-CM

## 2024-10-06 DIAGNOSIS — Z89511 Acquired absence of right leg below knee: Secondary | ICD-10-CM

## 2024-10-06 DIAGNOSIS — L97509 Non-pressure chronic ulcer of other part of unspecified foot with unspecified severity: Secondary | ICD-10-CM

## 2024-10-06 DIAGNOSIS — M86172 Other acute osteomyelitis, left ankle and foot: Secondary | ICD-10-CM | POA: Diagnosis not present

## 2024-10-06 DIAGNOSIS — I739 Peripheral vascular disease, unspecified: Secondary | ICD-10-CM | POA: Diagnosis not present

## 2024-10-06 DIAGNOSIS — Z7409 Other reduced mobility: Secondary | ICD-10-CM | POA: Diagnosis not present

## 2024-10-06 LAB — BASIC METABOLIC PANEL WITH GFR
Anion gap: 9 (ref 5–15)
BUN: 31 mg/dL — ABNORMAL HIGH (ref 8–23)
CO2: 27 mmol/L (ref 22–32)
Calcium: 9.2 mg/dL (ref 8.9–10.3)
Chloride: 105 mmol/L (ref 98–111)
Creatinine, Ser: 0.92 mg/dL (ref 0.44–1.00)
GFR, Estimated: 59 mL/min — ABNORMAL LOW
Glucose, Bld: 86 mg/dL (ref 70–99)
Potassium: 4.1 mmol/L (ref 3.5–5.1)
Sodium: 141 mmol/L (ref 135–145)

## 2024-10-06 LAB — CBC
HCT: 34.6 % — ABNORMAL LOW (ref 36.0–46.0)
Hemoglobin: 11.1 g/dL — ABNORMAL LOW (ref 12.0–15.0)
MCH: 28.5 pg (ref 26.0–34.0)
MCHC: 32.1 g/dL (ref 30.0–36.0)
MCV: 88.7 fL (ref 80.0–100.0)
Platelets: 220 K/uL (ref 150–400)
RBC: 3.9 MIL/uL (ref 3.87–5.11)
RDW: 14.9 % (ref 11.5–15.5)
WBC: 6.1 K/uL (ref 4.0–10.5)
nRBC: 0 % (ref 0.0–0.2)

## 2024-10-06 LAB — HEPARIN LEVEL (UNFRACTIONATED): Heparin Unfractionated: 0.69 [IU]/mL (ref 0.30–0.70)

## 2024-10-06 NOTE — Progress Notes (Signed)
 " Progress Note   Patient: [name] FMW:[id number] DOB: 1933/03/28 DOA: 10/04/2024     2 DOS: the patient was seen and examined on 10/06/2024      Brief hospital course: [name] is a 89 y.o. female with medical history significant for COPD, aortic stenosis, peripheral artery disease, right above-knee amputation. Patient presented to the ED from nursing home with reports of swelling and redness to her left lower extremity.  Patient reports this has been ongoing for several months.   CT angio-no evidence of significant aortoiliac stenosis or occlusion, severe diffuse narrowing of the left superficial femoral artery noted secondary to extensive atheromatous disease, left popliteal artery is incompletely visualized due to scatter artifact arising from left knee arthroplasty.  Large amount of stool in the rectum concerning for impaction. EDP talked to Dr. Magda with vascular surgery, recommended admission to [facility], start heparin  drip. Vascular surgeon following     Assessment and Plan:     Left foot cellulitis Patient was started on ceftriaxone  on admission, we will continue Continue with lower extremity elevation  Peripheral artery disease- CTA aorta plus bifemoral- No evidence of significant aortoiliac stenosis or occlusion. Severe diffuse narrowing of left superficial femoral artery is noted secondary to extensive atheromatous disease. Left popliteal artery is incompletely visualized due to scatter artifact arising from left knee arthroplasty.  On heparin  drip Follow-up on ABI ordered According to vascular surgeon patient is not a candidate for revascularization and so being planned for left above-knee amputation next week   Constipation Continue as needed bowel regiment   COPD - Stable. Continue as needed nebulization   Hypothyroidism - Resume Synthroid    DVT prophylaxis: Heparin    Code Status: FULL    Family Communication: Confirmed with daughter  [name] at bedside   Disposition Plan: PT OT requested   Consults called: Vascular   Subjective:  Patient seen and examined at bedside this morning Admits to improvement in left lower extremity redness Denies pain cough nausea vomiting   Physical Exam: General: Elderly female laying in bed in no acute distress Respiratory: clear to auscultation bilaterally, no wheezing, no crackles. Normal respiratory effort. No accessory muscle use.  Pace maker present 2 left upper chest. Cardiovascular: Regular rate and rhythm, no murmurs / rubs / gallops. Extremities warm Abdomen: no tenderness, no masses palpated. No hepatosplenomegaly. Bowel sounds positive.  Musculoskeletal: no clubbing / cyanosis.  Right above-knee amputation.  Skin: Trace swelling, mild erythema, differential warmth, minimal tenderness to left lower extremity, warmth extends down ankle area, mid and distal foot are cool to touch.  Small wound- 1cm, to lateral aspect of left big toe Neurologic: No facial asymmetry, moves extremity spontaneously, speech fluent. Psychiatric: Normal judgment and insight. Alert and oriented x 3. Normal mood.     Data Reviewed:   Vitals:   10/05/24 1509 10/05/24 1959 10/06/24 0444 10/06/24 0734  BP: (!) 106/47 (!) 116/38 (!) 135/44 (!) 150/132  Pulse: 66 62  69  Resp: 16 15 17 18   Temp: 98 F (36.7 C) (!) 97.5 F (36.4 C) 97.8 F (36.6 C) 98 F (36.7 C)  TempSrc:  Oral Oral   SpO2: 98% 91% 97% 96%  Weight:      Height:          Latest Ref Rng & Units 10/06/2024    5:31 AM 10/05/2024    5:51 AM 10/04/2024    3:37 PM  CBC  WBC 4.0 - 10.5 K/uL 6.1  7.0  7.1  Hemoglobin 12.0 - 15.0 g/dL 88.8  89.1  88.5   Hematocrit 36.0 - 46.0 % 34.6  33.0  36.3   Platelets 150 - 400 K/uL 220  220  247        Latest Ref Rng & Units 10/06/2024    5:31 AM 10/05/2024    5:51 AM 10/04/2024    3:37 PM  BMP  Glucose 70 - 99 mg/dL 86  884  96   BUN 8 - 23 mg/dL 31  38  42   Creatinine 0.44 - 1.00 mg/dL 9.07   9.02  8.98   Sodium 135 - 145 mmol/L 141  140  137   Potassium 3.5 - 5.1 mmol/L 4.1  3.9  4.5   Chloride 98 - 111 mmol/L 105  104  100   CO2 22 - 32 mmol/L 27  27  26    Calcium  8.9 - 10.3 mg/dL 9.2  8.8  9.1      Author: Drue ONEIDA Potter, MD 10/06/2024 10:17 AM  For on call review [email or web address].  "

## 2024-10-06 NOTE — Progress Notes (Signed)
" °  Daily Progress Note   Admission: Critical limb ischemia with tissue loss in the left lower extremity  Subjective: Resting comfortably, no complaints  Objective: Vitals:   10/06/24 0444 10/06/24 0734  BP: (!) 135/44 (!) 150/132  Pulse:  69  Resp: 17 18  Temp: 97.8 F (36.6 C) 98 F (36.7 C)  SpO2: 97% 96%    Physical Examination Left leg contracted, wounds at the toes Nonlabored breathing Regular rate   ASSESSMENT/PLAN:  Patient is an 89 year old female with history of right right AKA due to critical limb ischemia with associated contracture, nonambulatory.  She remains nonambulatory, has contracture in the left lower extremity with now wounds on that foot.  I had a long conversation with Gwenn regarding the above.  She is not a candidate for revascularization.  She was offered left above-knee amputation.  After discussing risks and benefits, Mckynna elected to proceed.  Will work on scheduling for early next week -possibly Monday.   Fonda FORBES Rim MD MS [facility] [phone] 10/06/2024  8:16 AM  "

## 2024-10-06 NOTE — Evaluation (Signed)
 Physical Therapy Evaluation Patient Details Name: [name] MRN: [id number] DOB: [birth year] Today's Date: 10/06/2024  History of Present Illness  89 y.o. female admitted from SNF rehab on 10/04/24 with LLE swelling for several months, chronic L 5th toe ischemic ulcer. Workup for L foot cellulitis, PAD; potential OR 1/12 for L AKA. PMH includes R AKA (2024), multiple BLE joint sxs, HTN, CAD, HFpEF, COPD, depression, anxiety.   Clinical Impression  Pt presents with an overall decrease in functional mobility secondary to above. PTA, pt resident at [facility], requires assist +2 from staff for OOB transfers to w/c, assist for ADL/iADLs. Today, pt requiring min-modA for bed mobility, tolerates prolonged sitting activity at supervision-level; unable to stand on LLE. Recommend maximove lift for OOB transfers pending potential L AKA on 1/12. Pt would benefit from continued acute PT services to maximize functional mobility and independence prior to return to SNF.       If plan is discharge home, recommend the following: Two people to help with walking and/or transfers;A lot of help with bathing/dressing/bathroom;Assistance with cooking/housework;Assist for transportation   Can travel by private vehicle   No    Equipment Recommendations None recommended by PT  Recommendations for Other Services       Functional Status Assessment Patient has had a recent decline in their functional status and demonstrates the ability to make significant improvements in function in a reasonable and predictable amount of time.     Precautions / Restrictions Precautions Precautions: Fall;Other (comment) Recall of Precautions/Restrictions: Intact Precaution/Restrictions Comments: h/o R AKA      Mobility  Bed Mobility Overal bed mobility: Needs Assistance Bed Mobility: Supine to Sit, Sit to Supine, Rolling Rolling: Supervision   Supine to sit: Mod assist, HOB elevated, Used rails Sit to supine:  Min assist, Used rails   General bed mobility comments: modA for HHA to elevate trunk; minA for LLE management return to supine; pt rolling self R/L with bed rail, assist to scoot up    Transfers Overall transfer level: Needs assistance Equipment used: None              Lateral/Scoot Transfers: Mod assist General transfer comment: trialled sit>stand from EOB, pt unable to offload buttocks with maxA+1. lateral scoots towards HOB with increased time and difficulty, modA to assist scooting hips with bed pad    Ambulation/Gait                  Stairs            Wheelchair Mobility     Tilt Bed    Modified Rankin (Stroke Patients Only)       Balance Overall balance assessment: Needs assistance Sitting-balance support: No upper extremity supported Sitting balance-Leahy Scale: Good Sitting balance - Comments: tolerated prolonged sitting EOB without UE support; requires assist to don L sock       Standing balance comment: unable to stand                             Pertinent Vitals/Pain Pain Assessment Pain Assessment: Faces Faces Pain Scale: Hurts little more Pain Location: BUEs Pain Descriptors / Indicators: Sore Pain Intervention(s): Monitored during session, Limited activity within patient's tolerance    Home Living Family/patient expects to be discharged to:: Skilled nursing facility                   Additional Comments: resident at [facility].  Prior Function               Mobility Comments: CNA assist bed mobility; assist +2 with slide board for OOB transfers to wheelchair. reports staff uses hoyer lift sometimes, including to weigh her ADLs Comments: staff assist for bed level and wheelchair-level ADLs. enjoys crocheting     Extremity/Trunk Assessment   Upper Extremity Assessment Upper Extremity Assessment: Generalized weakness    Lower Extremity Assessment Lower Extremity Assessment: RLE  deficits/detail;LLE deficits/detail RLE Deficits / Details: h/o R AKA in 2024 LLE Deficits / Details: chronic L toe wounds, notable L lower leg/foot swelling/redness/skin changes; hip and knee flexion contracture    Cervical / Trunk Assessment Cervical / Trunk Assessment: Kyphotic  Communication   Communication Communication: No apparent difficulties    Cognition Arousal: Alert Behavior During Therapy: WFL for tasks assessed/performed   PT - Cognitive impairments: No family/caregiver present to determine baseline                       PT - Cognition Comments: WFL for simple tasks. pt aware she is verbose and tangential in conversation, decreased attention requiring redirection to current task/topic Following commands: Intact       Cueing Cueing Techniques: Verbal cues     General Comments General comments (skin integrity, edema, etc.): educ re: role of acute PT, POC, activity recommendations, use of maximove for OOB transfers with nursing, potential transfer options s/p R AKA, potential d/c needs    Exercises     Assessment/Plan    PT Assessment Patient needs continued PT services  PT Problem List Decreased strength;Decreased range of motion;Decreased activity tolerance;Decreased balance;Decreased mobility;Decreased knowledge of use of DME;Decreased skin integrity       PT Treatment Interventions DME instruction;Functional mobility training;Therapeutic activities;Therapeutic exercise;Balance training;Patient/family education;Wheelchair mobility training    PT Goals (Current goals can be found in the Care Plan section)  Acute Rehab PT Goals Patient Stated Goal: return to [facility] SNF PT Goal Formulation: With patient Time For Goal Achievement: 10/20/24 Potential to Achieve Goals: Good    Frequency Min 1X/week     Co-evaluation               AM-PAC PT 6 Clicks Mobility  Outcome Measure Help needed turning from your back to your side while in a flat  bed without using bedrails?: A Lot Help needed moving from lying on your back to sitting on the side of a flat bed without using bedrails?: A Lot Help needed moving to and from a bed to a chair (including a wheelchair)?: A Lot Help needed standing up from a chair using your arms (e.g., wheelchair or bedside chair)?: Total Help needed to walk in hospital room?: Total Help needed climbing 3-5 steps with a railing? : Total 6 Click Score: 9    End of Session Equipment Utilized During Treatment: Gait belt Activity Tolerance: Patient tolerated treatment well Patient left: in bed;with call bell/phone within reach;with bed alarm set Nurse Communication: Mobility status;Need for lift equipment PT Visit Diagnosis: Other abnormalities of gait and mobility (R26.89);Muscle weakness (generalized) (M62.81)    Time: 9240-9177 PT Time Calculation (min) (ACUTE ONLY): 23 min   Charges:   PT Evaluation $PT Eval Moderate Complexity: 1 Mod PT Treatments $Therapeutic Activity: 8-22 mins PT General Charges $$ ACUTE PT VISIT: 1 Visit       Darice Almas, PT, DPT Acute Rehabilitation Services  Personal: Secure Chat Rehab Office: [phone]  Darice LITTIE Almas 10/06/2024, 10:47  AM

## 2024-10-06 NOTE — Progress Notes (Signed)
 VASCULAR LAB    ABI has been performed.  See CV proc for preliminary results.   Charnae Lill, RVT 10/06/2024, 2:40 PM

## 2024-10-06 NOTE — Progress Notes (Signed)
 OT Cancellation Note  Patient Details Name: [name] MRN: [id number] DOB: 18-Dec-1932   Cancelled Treatment:    Reason Eval/Treat Not Completed: Patient at procedure or test/ unavailable Patient with vascular at time of OT evaluation attempt. OT will check back as time permits.   Ronal Gift E. Taylah Dubiel, OTR/L Acute Rehabilitation Services [phone]   Ronal Gift Salt 10/06/2024, 2:05 PM

## 2024-10-06 NOTE — Progress Notes (Signed)
 VASCULAR LAB    Left lower extremity arterial duplex has been performed.  See CV proc for preliminary results.   Jalisa Sacco, RVT 10/06/2024, 2:39 PM

## 2024-10-06 NOTE — Progress Notes (Signed)
 PHARMACY - ANTICOAGULATION  Pharmacy Consult for Heparin  Indication: ischemic leg   Allergies[1]  Patient Measurements: Height: 5' 9 (175.3 cm) Weight: 65 kg (143 lb 4.8 oz) IBW/kg (Calculated) : 66.2 HEPARIN  DW (KG): 65  Vital Signs: Temp: 98 F (36.7 C) (01/10 0734) Temp Source: Oral (01/10 0444) BP: 150/132 (01/10 0734) Pulse Rate: 69 (01/10 0734)  Labs: Recent Labs    10/04/24 1537 10/04/24 2236 10/05/24 0551 10/05/24 1005 10/05/24 1924 10/06/24 0531  HGB 11.4*  --  10.8*  --   --  11.1*  HCT 36.3  --  33.0*  --   --  34.6*  PLT 247  --  220  --   --  220  APTT 35  --   --   --   --   --   LABPROT 12.9  --   --   --   --   --   INR 0.9  --   --   --   --   --   HEPARINUNFRC  --    < >  --  0.11* 0.70 0.69  CREATININE 1.01*  --  0.97  --   --  0.92   < > = values in this interval not displayed.    Estimated Creatinine Clearance: 42.5 mL/min (by C-G formula based on SCr of 0.92 mg/dL).  Assessment: 89 y.o. female with PAD and R AKA presenting with LLE ischemia. No anticoagulation prior to admission. Pharmacy consulted for heparin .    Heparin  level therapeutic at 0.69 on 1000 units/hr. She has had 2 heparin  levels at the upper end of therapeutic range so will adjust to target mid-range. No bleeding noted, CBC stable. Plan is for left AKA possibly Mon.  Goal of Therapy:  Heparin  level 0.3-0.7 units/ml Monitor platelets by anticoagulation protocol: Yes   Plan:  Decrease heparin  drip to 950 units/hr Monitor daily heparin  level, CBC, signs/symptoms of bleeding   Thank you for involving pharmacy in this patient's care.  Delon Sax, PharmD, BCPS Clinical Pharmacist Clinical phone for 10/06/2024 is [phone] 10/06/2024 8:31 AM           [1] No Known Allergies

## 2024-10-06 NOTE — Plan of Care (Signed)

## 2024-10-07 ENCOUNTER — Encounter (HOSPITAL_COMMUNITY): Payer: Self-pay | Admitting: Internal Medicine

## 2024-10-07 DIAGNOSIS — I70245 Atherosclerosis of native arteries of left leg with ulceration of other part of foot: Secondary | ICD-10-CM | POA: Diagnosis not present

## 2024-10-07 DIAGNOSIS — Z89511 Acquired absence of right leg below knee: Secondary | ICD-10-CM | POA: Diagnosis not present

## 2024-10-07 DIAGNOSIS — L97529 Non-pressure chronic ulcer of other part of left foot with unspecified severity: Secondary | ICD-10-CM | POA: Diagnosis not present

## 2024-10-07 DIAGNOSIS — I739 Peripheral vascular disease, unspecified: Secondary | ICD-10-CM | POA: Diagnosis not present

## 2024-10-07 LAB — CBC
HCT: 34.1 % — ABNORMAL LOW (ref 36.0–46.0)
Hemoglobin: 11 g/dL — ABNORMAL LOW (ref 12.0–15.0)
MCH: 28.5 pg (ref 26.0–34.0)
MCHC: 32.3 g/dL (ref 30.0–36.0)
MCV: 88.3 fL (ref 80.0–100.0)
Platelets: 198 K/uL (ref 150–400)
RBC: 3.86 MIL/uL — ABNORMAL LOW (ref 3.87–5.11)
RDW: 14.7 % (ref 11.5–15.5)
WBC: 5.4 K/uL (ref 4.0–10.5)
nRBC: 0 % (ref 0.0–0.2)

## 2024-10-07 LAB — BASIC METABOLIC PANEL WITH GFR
Anion gap: 10 (ref 5–15)
BUN: 24 mg/dL — ABNORMAL HIGH (ref 8–23)
CO2: 23 mmol/L (ref 22–32)
Calcium: 9.2 mg/dL (ref 8.9–10.3)
Chloride: 106 mmol/L (ref 98–111)
Creatinine, Ser: 0.82 mg/dL (ref 0.44–1.00)
GFR, Estimated: >60 mL/min
Glucose, Bld: 87 mg/dL (ref 70–99)
Potassium: 4.1 mmol/L (ref 3.5–5.1)
Sodium: 139 mmol/L (ref 135–145)

## 2024-10-07 LAB — SURGICAL PCR SCREEN
MRSA, PCR: NEGATIVE
Staphylococcus aureus: NEGATIVE

## 2024-10-07 LAB — VAS US ABI WITH/WO TBI: Left ABI: 0.28

## 2024-10-07 LAB — HEPARIN LEVEL (UNFRACTIONATED): Heparin Unfractionated: 0.5 [IU]/mL (ref 0.30–0.70)

## 2024-10-07 MED ORDER — PAROXETINE HCL 20 MG PO TABS
20.0000 mg | ORAL_TABLET | Freq: Every day | ORAL | Status: DC
  Administered 2024-10-07 – 2024-10-12 (×6): 20 mg via ORAL
  Filled 2024-10-07 (×6): qty 1

## 2024-10-07 NOTE — Plan of Care (Signed)

## 2024-10-07 NOTE — Progress Notes (Signed)
 PHARMACY - ANTICOAGULATION  Pharmacy Consult for Heparin  Indication: ischemic leg   Allergies[1]  Patient Measurements: Height: 5' 9 (175.3 cm) Weight: 65 kg (143 lb 4.8 oz) IBW/kg (Calculated) : 66.2 HEPARIN  DW (KG): 65  Vital Signs: Temp: 98.2 F (36.8 C) (01/11 0734) Temp Source: Oral (01/11 0734) BP: 137/51 (01/11 0734) Pulse Rate: 60 (01/11 0734)  Labs: Recent Labs    10/04/24 1537 10/04/24 2236 10/05/24 0551 10/05/24 1005 10/05/24 1924 10/06/24 0531 10/07/24 0702  HGB 11.4*  --  10.8*  --   --  11.1* 11.0*  HCT 36.3  --  33.0*  --   --  34.6* 34.1*  PLT 247  --  220  --   --  220 198  APTT 35  --   --   --   --   --   --   LABPROT 12.9  --   --   --   --   --   --   INR 0.9  --   --   --   --   --   --   HEPARINUNFRC  --    < >  --    < > 0.70 0.69 0.50  CREATININE 1.01*  --  0.97  --   --  0.92 0.82   < > = values in this interval not displayed.    Estimated Creatinine Clearance: 47.7 mL/min (by C-G formula based on SCr of 0.82 mg/dL).  Assessment: 89 y.o. female with PAD and R AKA presenting with LLE ischemia. No anticoagulation prior to admission. Pharmacy consulted for heparin .    Heparin  level therapeutic at 0.5 on 950 units/hr. No bleeding noted, CBC stable. Plan is for left AKA Tues, 1/13.  Goal of Therapy:  Heparin  level 0.3-0.7 units/ml Monitor platelets by anticoagulation protocol: Yes   Plan:  Continue heparin  drip at 950 units/hr Hold heparin  drip at midnight on 1/13 Monitor daily heparin  level, CBC, signs/symptoms of bleeding   Thank you for involving pharmacy in this patient's care.  Delon Sax, PharmD, BCPS Clinical Pharmacist Clinical phone for 10/07/2024 is [phone] 10/07/2024 8:41 AM            [1] No Known Allergies

## 2024-10-07 NOTE — Progress Notes (Signed)
 " PROGRESS NOTE  [name]:[id number] DOB: 11/06/1932 DOA: 10/04/2024 PCP: Orpha Yancey LABOR, MD   LOS: 3 days   Brief narrative:   [name] is a 89 y.o. female with medical history significant for COPD, aortic stenosis, peripheral artery disease, right above-knee amputation presented to hospital from skilled nursing facility with swelling and redness of her left lower extremity for several months.  CT angiogram showed no occlusion but severe diffuse narrowing of the left superficial femoral artery noted secondary to extensive atheromatous disease, left popliteal artery is incompletely visualized due to scatter artifact arising from left knee arthroplasty.  Vascular surgery has seen the patient and at this time patient has been started on heparin  drip.  Awaiting for surgical intervention.      Assessment/Plan: Principal Problem:   Peripheral arterial disease Active Problems:   Acquired hypothyroidism   Severe aortic stenosis   History of COPD   S/P AKA (above knee amputation) unilateral, right (HCC)   Pacemaker   Left foot cellulitis Continue Rocephin  leg elevation and supportive care  Peripheral artery disease- CTA aorta plus bifemoral- No evidence of significant aortoiliac stenosis or occlusion. Severe diffuse narrowing of left superficial femoral artery is noted secondary to extensive atheromatous disease. Left popliteal artery is incompletely visualized due to scatter artifact arising from left knee arthroplasty.  Currently on heparin  drip.  Vascular surgery has seen the patient and patient is not a revascularization candidate so plan for left above-knee amputation.  ABI of the left lower extremity showed critical left leg ischemia.  Denies overt pain at this time.   Constipation Continue bowel regimen   COPD Continue bronchodilators and supportive care   Hypothyroidism Continue Synthroid   DVT prophylaxis: Heparin  drip   Disposition: Skilled nursing  facility likely.  Status is: Inpatient Remains inpatient appropriate because: Plan for left above-knee amputation     Code Status:     Code Status: Full Code  Family Communication: Spoke with the patient's friend at bedside  Consultants:  Vascular surgery  Procedures: None   Anti-infectives:  Rocephin  IV  Anti-infectives (From admission, onward)    Start     Dose/Rate Route Frequency Ordered Stop   10/04/24 2300  cefTRIAXone  (ROCEPHIN ) 2 g in sodium chloride  0.9 % 100 mL IVPB        2 g 200 mL/hr over 30 Minutes Intravenous Every 24 hours 10/04/24 2152         Subjective: Today, patient was seen and examined at bedside.  Patient complains of mild foot discomfort but otherwise okay.  Denies any nausea vomiting fever chills or rigor.  Denies any shortness of breath dyspnea chest pain or fever  Objective: Vitals:   10/07/24 0515 10/07/24 0734  BP: (!) 150/55 (!) 137/51  Pulse: 69 60  Resp: 16   Temp: 98.6 F (37 C) 98.2 F (36.8 C)  SpO2: 96% 100%   No intake or output data in the 24 hours ending 10/07/24 1027 Filed Weights   10/04/24 1439  Weight: 65 kg   Body mass index is 21.16 kg/m.   Physical Exam:  GENERAL: Patient is alert awake and oriented. Not in obvious distress.  Elderly female, HENT: No scleral pallor or icterus. Pupils equally reactive to light. Oral mucosa is moist NECK: is supple, no gross swelling noted. CHEST: Clear to auscultation. No crackles or wheezes.  Diminished breath sounds bilaterally. CVS: S1 and S2 heard, no murmur. Regular rate and rhythm.  ABDOMEN: Soft, non-tender, bowel sounds are present.  EXTREMITIES: Right below-knee amputation, left lower extremity with ulceration edema erythema of the left foot.  Left toes with ulceration. CNS: Cranial nerves are intact. No focal motor deficits.  Alert awake oriented. SKIN: warm and dry, left lower extremity erythema edema  Data Review: I have personally reviewed the following  laboratory data and studies,  CBC: Recent Labs  Lab 10/04/24 1513 10/04/24 1537 10/05/24 0551 10/06/24 0531 10/07/24 0702  WBC  --  7.1 7.0 6.1 5.4  NEUTROABS  --  4.4  --   --   --   HGB 11.2* 11.4* 10.8* 11.1* 11.0*  HCT 33.0* 36.3 33.0* 34.6* 34.1*  MCV  --  89.9 86.6 88.7 88.3  PLT  --  247 220 220 198   Basic Metabolic Panel: Recent Labs  Lab 10/04/24 1513 10/04/24 1537 10/05/24 0551 10/06/24 0531 10/07/24 0702  NA 137 137 140 141 139  K 4.6 4.5 3.9 4.1 4.1  CL 98 100 104 105 106  CO2  --  26 27 27 23   GLUCOSE 85 96 115* 86 87  BUN 40* 42* 38* 31* 24*  CREATININE 1.30* 1.01* 0.97 0.92 0.82  CALCIUM   --  9.1 8.8* 9.2 9.2   Liver Function Tests: No results for input(s): AST, ALT, ALKPHOS, BILITOT, PROT, ALBUMIN in the last 168 hours. No results for input(s): LIPASE, AMYLASE in the last 168 hours. No results for input(s): AMMONIA in the last 168 hours. Cardiac Enzymes: No results for input(s): CKTOTAL, CKMB, CKMBINDEX, TROPONINI in the last 168 hours. BNP (last 3 results) No results for input(s): BNP in the last 8760 hours.  ProBNP (last 3 results) No results for input(s): PROBNP in the last 8760 hours.  CBG: No results for input(s): GLUCAP in the last 168 hours. No results found for this or any previous visit (from the past 240 hours).   Studies: VAS US  LOWER EXTREMITY ARTERIAL DUPLEX Result Date: 10/07/2024 LOWER EXTREMITY ARTERIAL DUPLEX STUDY Patient Name:  [name]  Date of Exam:   10/06/2024 Medical Rec #: [id number]            Accession #:    [id number] Date of Birth: 1932-12-25            Patient Gender: F Patient Age:   54 years Exam Location:  [facility] Procedure:      VAS US  LOWER EXTREMITY ARTERIAL DUPLEX Referring Phys: DEBBY ROBERTSON --------------------------------------------------------------------------------  Indications: Rest pain, ulceration, and peripheral artery disease. High Risk          Hyperlipidemia, past history of smoking, coronary artery Factors:          disease. Other Factors: TAVR.  Vascular Interventions: Right AKA 6/24. Current ABI:            ABI: L-0.28                         TBI: R:absent Limitations: Contracture/positioning Comparison Study: No prior LEA on file Performing Technologist: Alberta Lis RVS  Examination Guidelines: A complete evaluation includes B-mode imaging, spectral Doppler, color Doppler, and power Doppler as needed of all accessible portions of each vessel. Bilateral testing is considered an integral part of a complete examination. Limited examinations for reoccurring indications may be performed as noted.   +----------+--------+-----+---------------+-------------------+-----------+ LEFT      PSV cm/sRatioStenosis       Waveform           Comments    +----------+--------+-----+---------------+-------------------+-----------+ CFA Prox  97                          monophasic                     +----------+--------+-----+---------------+-------------------+-----------+ CFA Distal182                         monophasic                     +----------+--------+-----+---------------+-------------------+-----------+ DFA       72                                                         +----------+--------+-----+---------------+-------------------+-----------+ SFA Prox  142          30-49% stenosismonophasic                     +----------+--------+-----+---------------+-------------------+-----------+ SFA Mid   277          50-74% stenosismonophasic                     +----------+--------+-----+---------------+-------------------+-----------+ SFA Distal77                          monophasic                     +----------+--------+-----+---------------+-------------------+-----------+ POP Prox  92                          monophasic                      +----------+--------+-----+---------------+-------------------+-----------+ POP Mid   51                          monophasic                     +----------+--------+-----+---------------+-------------------+-----------+ POP Distal40                          monophasic                     +----------+--------+-----+---------------+-------------------+-----------+ ATA Prox  62                          dampened monophasic            +----------+--------+-----+---------------+-------------------+-----------+ ATA Mid   40                          dampened monophasic            +----------+--------+-----+---------------+-------------------+-----------+ ATA Distal32                          dampened monophasic            +----------+--------+-----+---------------+-------------------+-----------+ PTA Prox  62                          dampened monophasic            +----------+--------+-----+---------------+-------------------+-----------+  PTA Mid   43                          dampened monophasiccollaterals +----------+--------+-----+---------------+-------------------+-----------+ PTA Distal40                          dampened monophasic            +----------+--------+-----+---------------+-------------------+-----------+  Summary: Left: Significant narrowing of the SFA with 30-49% proximal stenosis and with at least 50-74% mid to distal stenosis. Monophasic flow noted in the popliteal artery. Dampened monophasic flow noted throughout the ATA and PTA. Peroneal artery not visualized.  See table(s) above for measurements and observations. Electronically signed by Fonda Rim on 10/07/2024 at 8:22:02 AM.    Final    VAS US  ABI WITH/WO TBI Result Date: 10/07/2024  LOWER EXTREMITY DOPPLER STUDY Patient Name:  ELLYANA CRIGLER  Date of Exam:   10/06/2024 Medical Rec #: [id number]            Accession #:    [id number] Date of Birth: Dec 10, 1932            Patient Gender:  F Patient Age:   38 years Exam Location:  [facility] Procedure:      VAS US  ABI WITH/WO TBI Referring Phys: DEBBY ROBERTSON --------------------------------------------------------------------------------  Indications: Rest pain, ulceration, and peripheral artery disease. High Risk         Hyperlipidemia, past history of smoking, coronary artery Factors:          disease. Other Factors: Atrial fibrillation, status post TAVR 2023.  Vascular Interventions: Right AKA 02/2023. Limitations: Today's exam was limited due to Contraction, rest pain and patient              positioning. Comparison Study: Prior ABI done 03/02/23 Performing Technologist: Rachel Pellet RVS  Examination Guidelines: A complete evaluation includes at minimum, Doppler waveform signals and systolic blood pressure reading at the level of bilateral brachial, anterior tibial, and posterior tibial arteries, when vessel segments are accessible. Bilateral testing is considered an integral part of a complete examination. Photoelectric Plethysmograph (PPG) waveforms and toe systolic pressure readings are included as required and additional duplex testing as needed. Limited examinations for reoccurring indications may be performed as noted.  ABI Findings: +---------+------------------+-----+---------+--------+ Right    Rt Pressure (mmHg)IndexWaveform Comment  +---------+------------------+-----+---------+--------+ Brachial 128                    triphasic         +---------+------------------+-----+---------+--------+ PTA                                      AKA      +---------+------------------+-----+---------+--------+ DP                                       AKA      +---------+------------------+-----+---------+--------+ Great Toe                                AKA      +---------+------------------+-----+---------+--------+ +---------+------------------+-----+----------+-------+ Left     Lt Pressure  (mmHg)IndexWaveform  Comment +---------+------------------+-----+----------+-------+ Brachial 132  triphasic         +---------+------------------+-----+----------+-------+ PTA      37                0.28 monophasic        +---------+------------------+-----+----------+-------+ DP       27                0.20 monophasic        +---------+------------------+-----+----------+-------+ Great Toe0                 0.00 Absent            +---------+------------------+-----+----------+-------+ +-------+-----------+-----------+------------+------------+ ABI/TBIToday's ABIToday's TBIPrevious ABIPrevious TBI +-------+-----------+-----------+------------+------------+ Right  AKA        AKA        0.49        0.24         +-------+-----------+-----------+------------+------------+ Left   0.28       absent     0.36        0.17         +-------+-----------+-----------+------------+------------+ Left ABIs appear decreased compared to prior study on 03/02/23.  Summary: Right:  AKA. Left: Resting left ankle-brachial index indicates critical left limb ischemia. The left toe-brachial index is abnormal.  *See table(s) above for measurements and observations.  Electronically signed by Fonda Rim on 10/07/2024 at 8:21:55 AM.    Final       Vernal Alstrom, MD  [facility] 10/07/2024  If 7PM-7AM, please contact night-coverage             "

## 2024-10-07 NOTE — Progress Notes (Addendum)
" °  Daily Progress Note   Admission: Critical limb ischemia with tissue loss in the left lower extremity  Subjective: Resting comfortably, no complaints, pleasant  Objective: Vitals:   10/07/24 0515 10/07/24 0734  BP: (!) 150/55 (!) 137/51  Pulse: 69 60  Resp: 16   Temp: 98.6 F (37 C) 98.2 F (36.8 C)  SpO2: 96% 100%    Physical Examination Left leg contracted, wounds at the toes, left leg hanging off the bed Nonlabored breathing Regular rate   ASSESSMENT/PLAN:  Patient is an 89 year old female with history of right right AKA due to critical limb ischemia with associated contracture, nonambulatory.  She remains nonambulatory, has contracture in the left lower extremity with now wounds on that foot.  I had a long conversation with [name] regarding the above.  She is not a candidate for revascularization.  She was offered left above-knee amputation.  After discussing risks and benefits, [name] elected to proceed.    Plan for left lower extremity above-knee amputation Tuesday Please make n.p.o. midnight Monday Hold heparin  at midnight Monday   Fonda FORBES Rim MD MS [facility] [phone] 10/07/2024  8:09 AM  "

## 2024-10-07 NOTE — Plan of Care (Signed)
   Problem: Activity: Goal: Risk for activity intolerance will decrease Outcome: Progressing   Problem: Coping: Goal: Level of anxiety will decrease Outcome: Progressing   Problem: Safety: Goal: Ability to remain free from injury will improve Outcome: Progressing

## 2024-10-07 NOTE — Progress Notes (Signed)
" ° ° °  PROCEDURAL EXPEDITER PROGRESS NOTE  Patient Name: [name]  DOB:26-Sep-1933 Date of Admission: 10/04/2024  Date of Assessment:10/07/2024   -------------------------------------------------------------------------------------------------------------------   Brief clinical summary: Pt to OR for L above the knee amputation. Heparin  to be held at midnight Monday. Pt has pacemaker  Orders in place:  Yes   Labs, test, and orders reviewed: Y  Requires surgical clearance:  No  Barriers noted: No PCR done   Intervention provided by [facility] team: Ordered surgical PCR  Barrier resolved:  yes   -------------------------------------------------------------------------------------------------------------------  [employer], [place], [place] Please contact us  directly via secure chat (search for [facility]) or by calling us  at [phone] [facility]).  "

## 2024-10-07 NOTE — Hospital Course (Signed)
" °  [name] is a 89 y.o. female with medical history significant for COPD, aortic stenosis, peripheral artery disease, right above-knee amputation presented to hospital from skilled nursing facility with swelling and redness of her left lower extremity for several months.  CT angiogram showed no occlusion but severe diffuse narrowing of the left superficial femoral artery noted secondary to extensive atheromatous disease, left popliteal artery is incompletely visualized due to scatter artifact arising from left knee arthroplasty.  Vascular surgery has seen the patient and at this time patient has been started on heparin  drip.  Awaiting for surgical intervention.      Assessment and Plan:    Left foot cellulitis Continue Rocephin  leg elevation and supportive care  Peripheral artery disease- CTA aorta plus bifemoral- No evidence of significant aortoiliac stenosis or occlusion. Severe diffuse narrowing of left superficial femoral artery is noted secondary to extensive atheromatous disease. Left popliteal artery is incompletely visualized due to scatter artifact arising from left knee arthroplasty.  Currently on heparin  drip.  Vascular surgery has seen the patient and patient is not a revascularization candidate so plan for above-knee amputation.  ABI of the left lower extremity showed critical left leg ischemia.    Constipation Continue bowel regimen   COPD Continue bronchodilators and supportive care   Hypothyroidism Continue Synthroid     "

## 2024-10-08 LAB — BASIC METABOLIC PANEL WITH GFR
Anion gap: 9 (ref 5–15)
BUN: 22 mg/dL (ref 8–23)
CO2: 23 mmol/L (ref 22–32)
Calcium: 9 mg/dL (ref 8.9–10.3)
Chloride: 106 mmol/L (ref 98–111)
Creatinine, Ser: 0.9 mg/dL (ref 0.44–1.00)
GFR, Estimated: >60 mL/min
Glucose, Bld: 81 mg/dL (ref 70–99)
Potassium: 4.1 mmol/L (ref 3.5–5.1)
Sodium: 139 mmol/L (ref 135–145)

## 2024-10-08 LAB — CBC
HCT: 35.2 % — ABNORMAL LOW (ref 36.0–46.0)
Hemoglobin: 11.4 g/dL — ABNORMAL LOW (ref 12.0–15.0)
MCH: 28.6 pg (ref 26.0–34.0)
MCHC: 32.4 g/dL (ref 30.0–36.0)
MCV: 88.4 fL (ref 80.0–100.0)
Platelets: 199 K/uL (ref 150–400)
RBC: 3.98 MIL/uL (ref 3.87–5.11)
RDW: 14.6 % (ref 11.5–15.5)
WBC: 5.8 K/uL (ref 4.0–10.5)
nRBC: 0 % (ref 0.0–0.2)

## 2024-10-08 LAB — MAGNESIUM: Magnesium: 2.3 mg/dL (ref 1.7–2.4)

## 2024-10-08 LAB — HEPARIN LEVEL (UNFRACTIONATED): Heparin Unfractionated: 0.38 [IU]/mL (ref 0.30–0.70)

## 2024-10-08 NOTE — Progress Notes (Signed)
 " PROGRESS NOTE  [name] FMW:[id number] DOB: 1933/06/21 DOA: 10/04/2024 PCP: Orpha Yancey LABOR, MD   LOS: 4 days   Brief narrative:   [name] is a 89 y.o. female with medical history significant for COPD, aortic stenosis, peripheral artery disease, right above-knee amputation presented to hospital from skilled nursing facility with swelling and redness of her left lower extremity for several months.  CT angiogram showed no occlusion but severe diffuse narrowing of the left superficial femoral artery noted secondary to extensive atheromatous disease, left popliteal artery is incompletely visualized due to scatter artifact arising from left knee arthroplasty.  Vascular surgery has seen the patient and at this time patient has been started on heparin  drip.  Awaiting for surgical intervention.      Assessment/Plan: Principal Problem:   Peripheral arterial disease Active Problems:   Acquired hypothyroidism   Severe aortic stenosis   History of COPD   S/P AKA (above knee amputation) unilateral, right (HCC)   Pacemaker   Left foot cellulitis Continue Rocephin  leg elevation and supportive care.  Improving.  Peripheral artery disease- CTA aorta plus bifemoral- No evidence of significant aortoiliac stenosis or occlusion. Severe diffuse narrowing of left superficial femoral artery is noted secondary to extensive atheromatous disease. Left popliteal artery is incompletely visualized due to scatter artifact arising from left knee arthroplasty.  Currently on heparin  drip.  Vascular surgery has seen the patient and patient is not a revascularization candidate so plan for left above-knee amputation likely 10/09/2024.  ABI of the left lower extremity showed critical left leg ischemia.  Denies overt pain at this time.   Constipation Continue bowel regimen   COPD Continue bronchodilators and supportive care   Hypothyroidism Continue Synthroid   DVT prophylaxis: Heparin   drip   Disposition: Skilled nursing facility after surgical intervention  Status is: Inpatient Remains inpatient appropriate because: Plan for left above-knee amputation     Code Status:     Code Status: Full Code  Family Communication: Spoke with the patient's friend at bedside on 10/07/2024  Consultants:  Vascular surgery  Procedures: None   Anti-infectives:  Rocephin  IV  Anti-infectives (From admission, onward)    Start     Dose/Rate Route Frequency Ordered Stop   10/04/24 2300  cefTRIAXone  (ROCEPHIN ) 2 g in sodium chloride  0.9 % 100 mL IVPB        2 g 200 mL/hr over 30 Minutes Intravenous Every 24 hours 10/04/24 2152         Subjective: Today, patient was seen and examined at bedside.  Patient denies pain, nausea, vomiting, shortness of breath and chest pain.  She however complains of mild left eye discomfort but no obvious pain redness.  Objective: Vitals:   10/07/24 2022 10/08/24 0356  BP: (!) 134/55 (!) 157/52  Pulse: 76 68  Resp: 16 16  Temp: 98.1 F (36.7 C) 97.8 F (36.6 C)  SpO2: 96% 98%    Intake/Output Summary (Last 24 hours) at 10/08/2024 0921 Last data filed at 10/08/2024 0646 Gross per 24 hour  Intake 460 ml  Output 1550 ml  Net -1090 ml   Filed Weights   10/04/24 1439  Weight: 65 kg   Body mass index is 21.16 kg/m.   Physical Exam:  GENERAL: Patient is alert awake and oriented. Not in obvious distress.  Elderly female, HENT: No scleral pallor or icterus. Pupils equally reactive to light. Oral mucosa is moist NECK: is supple, no gross swelling noted. CHEST: Clear to auscultation. No crackles or  wheezes.  Diminished breath sounds bilaterally. CVS: S1 and S2 heard, no murmur. Regular rate and rhythm.  ABDOMEN: Soft, non-tender, bowel sounds are present. EXTREMITIES: Right below-knee amputation, left lower extremity with ulceration edema erythema of the left foot.  Erythema has decreased some.  Left toes with ulceration. CNS: Cranial  nerves are intact. No focal motor deficits.  Alert awake oriented. SKIN: warm and dry, left lower extremity erythema, edema  Data Review: I have personally reviewed the following laboratory data and studies,  CBC: Recent Labs  Lab 10/04/24 1537 10/05/24 0551 10/06/24 0531 10/07/24 0702 10/08/24 0702  WBC 7.1 7.0 6.1 5.4 5.8  NEUTROABS 4.4  --   --   --   --   HGB 11.4* 10.8* 11.1* 11.0* 11.4*  HCT 36.3 33.0* 34.6* 34.1* 35.2*  MCV 89.9 86.6 88.7 88.3 88.4  PLT 247 220 220 198 199   Basic Metabolic Panel: Recent Labs  Lab 10/04/24 1537 10/05/24 0551 10/06/24 0531 10/07/24 0702 10/08/24 0702  NA 137 140 141 139 139  K 4.5 3.9 4.1 4.1 4.1  CL 100 104 105 106 106  CO2 26 27 27 23 23   GLUCOSE 96 115* 86 87 81  BUN 42* 38* 31* 24* 22  CREATININE 1.01* 0.97 0.92 0.82 0.90  CALCIUM  9.1 8.8* 9.2 9.2 9.0  MG  --   --   --   --  2.3   Liver Function Tests: No results for input(s): AST, ALT, ALKPHOS, BILITOT, PROT, ALBUMIN in the last 168 hours. No results for input(s): LIPASE, AMYLASE in the last 168 hours. No results for input(s): AMMONIA in the last 168 hours. Cardiac Enzymes: No results for input(s): CKTOTAL, CKMB, CKMBINDEX, TROPONINI in the last 168 hours. BNP (last 3 results) No results for input(s): BNP in the last 8760 hours.  ProBNP (last 3 results) No results for input(s): PROBNP in the last 8760 hours.  CBG: No results for input(s): GLUCAP in the last 168 hours. Recent Results (from the past 240 hours)  Surgical pcr screen     Status: None   Collection Time: 10/07/24  6:15 PM   Specimen: Nasal Mucosa; Nasal Swab  Result Value Ref Range Status   MRSA, PCR NEGATIVE NEGATIVE Final   Staphylococcus aureus NEGATIVE NEGATIVE Final    Comment: (NOTE) The Xpert SA Assay (FDA approved for NASAL specimens in patients 40 years of age and older), is one component of a comprehensive surveillance program. It is not intended to diagnose  infection nor to guide or monitor treatment. Performed at [facility], [address] [place], [place] [address]      Studies: VAS US  LOWER EXTREMITY ARTERIAL DUPLEX Result Date: 10/07/2024 LOWER EXTREMITY ARTERIAL DUPLEX STUDY Patient Name:  [name]  Date of Exam:   10/06/2024 Medical Rec #: [id number]            Accession #:    [id number] Date of Birth: [birth year]            Patient Gender: F Patient Age:   58 years Exam Location:  [facility] Procedure:      VAS US  LOWER EXTREMITY ARTERIAL DUPLEX Referring Phys: DEBBY ROBERTSON --------------------------------------------------------------------------------  Indications: Rest pain, ulceration, and peripheral artery disease. High Risk         Hyperlipidemia, past history of smoking, coronary artery Factors:          disease. Other Factors: TAVR.  Vascular Interventions: Right AKA 6/24. Current ABI:  ABI: L-0.28                         TBI: R:absent Limitations: Contracture/positioning Comparison Study: No prior LEA on file Performing Technologist: Alberta Lis RVS  Examination Guidelines: A complete evaluation includes B-mode imaging, spectral Doppler, color Doppler, and power Doppler as needed of all accessible portions of each vessel. Bilateral testing is considered an integral part of a complete examination. Limited examinations for reoccurring indications may be performed as noted.   +----------+--------+-----+---------------+-------------------+-----------+ LEFT      PSV cm/sRatioStenosis       Waveform           Comments    +----------+--------+-----+---------------+-------------------+-----------+ CFA Prox  97                          monophasic                     +----------+--------+-----+---------------+-------------------+-----------+ CFA Distal182                         monophasic                     +----------+--------+-----+---------------+-------------------+-----------+ DFA        72                                                         +----------+--------+-----+---------------+-------------------+-----------+ SFA Prox  142          30-49% stenosismonophasic                     +----------+--------+-----+---------------+-------------------+-----------+ SFA Mid   277          50-74% stenosismonophasic                     +----------+--------+-----+---------------+-------------------+-----------+ SFA Distal77                          monophasic                     +----------+--------+-----+---------------+-------------------+-----------+ POP Prox  92                          monophasic                     +----------+--------+-----+---------------+-------------------+-----------+ POP Mid   51                          monophasic                     +----------+--------+-----+---------------+-------------------+-----------+ POP Distal40                          monophasic                     +----------+--------+-----+---------------+-------------------+-----------+ ATA Prox  62                          dampened monophasic            +----------+--------+-----+---------------+-------------------+-----------+  ATA Mid   40                          dampened monophasic            +----------+--------+-----+---------------+-------------------+-----------+ ATA Distal32                          dampened monophasic            +----------+--------+-----+---------------+-------------------+-----------+ PTA Prox  62                          dampened monophasic            +----------+--------+-----+---------------+-------------------+-----------+ PTA Mid   43                          dampened monophasiccollaterals +----------+--------+-----+---------------+-------------------+-----------+ PTA Distal40                          dampened monophasic             +----------+--------+-----+---------------+-------------------+-----------+  Summary: Left: Significant narrowing of the SFA with 30-49% proximal stenosis and with at least 50-74% mid to distal stenosis. Monophasic flow noted in the popliteal artery. Dampened monophasic flow noted throughout the ATA and PTA. Peroneal artery not visualized.  See table(s) above for measurements and observations. Electronically signed by Fonda Rim on 10/07/2024 at 8:22:02 AM.    Final    VAS US  ABI WITH/WO TBI Result Date: 10/07/2024  LOWER EXTREMITY DOPPLER STUDY Patient Name:  [name]  Date of Exam:   10/06/2024 Medical Rec #: [id number]            Accession #:    [id number] Date of Birth: 1933-01-10            Patient Gender: F Patient Age:   62 years Exam Location:  [facility] Procedure:      VAS US  ABI WITH/WO TBI Referring Phys: DEBBY ROBERTSON --------------------------------------------------------------------------------  Indications: Rest pain, ulceration, and peripheral artery disease. High Risk         Hyperlipidemia, past history of smoking, coronary artery Factors:          disease. Other Factors: Atrial fibrillation, status post TAVR 2023.  Vascular Interventions: Right AKA 02/2023. Limitations: Today's exam was limited due to Contraction, rest pain and patient              positioning. Comparison Study: Prior ABI done 03/02/23 Performing Technologist: Rachel Pellet RVS  Examination Guidelines: A complete evaluation includes at minimum, Doppler waveform signals and systolic blood pressure reading at the level of bilateral brachial, anterior tibial, and posterior tibial arteries, when vessel segments are accessible. Bilateral testing is considered an integral part of a complete examination. Photoelectric Plethysmograph (PPG) waveforms and toe systolic pressure readings are included as required and additional duplex testing as needed. Limited examinations for reoccurring indications may be performed as  noted.  ABI Findings: +---------+------------------+-----+---------+--------+ Right    Rt Pressure (mmHg)IndexWaveform Comment  +---------+------------------+-----+---------+--------+ Brachial 128                    triphasic         +---------+------------------+-----+---------+--------+ PTA  AKA      +---------+------------------+-----+---------+--------+ DP                                       AKA      +---------+------------------+-----+---------+--------+ Great Toe                                AKA      +---------+------------------+-----+---------+--------+ +---------+------------------+-----+----------+-------+ Left     Lt Pressure (mmHg)IndexWaveform  Comment +---------+------------------+-----+----------+-------+ Brachial 132                    triphasic         +---------+------------------+-----+----------+-------+ PTA      37                0.28 monophasic        +---------+------------------+-----+----------+-------+ DP       27                0.20 monophasic        +---------+------------------+-----+----------+-------+ Great Toe0                 0.00 Absent            +---------+------------------+-----+----------+-------+ +-------+-----------+-----------+------------+------------+ ABI/TBIToday's ABIToday's TBIPrevious ABIPrevious TBI +-------+-----------+-----------+------------+------------+ Right  AKA        AKA        0.49        0.24         +-------+-----------+-----------+------------+------------+ Left   0.28       absent     0.36        0.17         +-------+-----------+-----------+------------+------------+ Left ABIs appear decreased compared to prior study on 03/02/23.  Summary: Right:  AKA. Left: Resting left ankle-brachial index indicates critical left limb ischemia. The left toe-brachial index is abnormal.  *See table(s) above for measurements and observations.   Electronically signed by Fonda Rim on 10/07/2024 at 8:21:55 AM.    Final       Vernal Alstrom, MD  [facility] 10/08/2024  If 7PM-7AM, please contact night-coverage             "

## 2024-10-08 NOTE — Progress Notes (Signed)
" °  Progress Note    10/08/2024 8:04 AM * No surgery found *  Subjective:  no complaints   Vitals:   10/07/24 2022 10/08/24 0356  BP: (!) 134/55 (!) 157/52  Pulse: 76 68  Resp: 16 16  Temp: 98.1 F (36.7 C) 97.8 F (36.6 C)  SpO2: 96% 98%   Physical Exam: Lungs:  non labored Extremities:  left leg contracted, hanging off the bed Neurologic: A&O  CBC    Component Value Date/Time   WBC 5.8 10/08/2024 0702   RBC 3.98 10/08/2024 0702   HGB 11.4 (L) 10/08/2024 0702   HGB 13.4 06/23/2020 1500   HCT 35.2 (L) 10/08/2024 0702   HCT 41.7 06/23/2020 1500   PLT 199 10/08/2024 0702   PLT 282 06/23/2020 1500   MCV 88.4 10/08/2024 0702   MCV 86 06/23/2020 1500   MCH 28.6 10/08/2024 0702   MCHC 32.4 10/08/2024 0702   RDW 14.6 10/08/2024 0702   RDW 13.4 06/23/2020 1500   LYMPHSABS 1.9 10/04/2024 1537   MONOABS 0.6 10/04/2024 1537   EOSABS 0.1 10/04/2024 1537   BASOSABS 0.1 10/04/2024 1537    BMET    Component Value Date/Time   NA 139 10/07/2024 0702   NA 144 06/30/2022 1650   K 4.1 10/07/2024 0702   CL 106 10/07/2024 0702   CO2 23 10/07/2024 0702   GLUCOSE 87 10/07/2024 0702   BUN 24 (H) 10/07/2024 0702   BUN 17 06/30/2022 1650   CREATININE 0.82 10/07/2024 0702   CALCIUM  9.2 10/07/2024 0702   GFRNONAA >60 10/07/2024 0702   GFRAA 87 06/23/2020 1500    INR    Component Value Date/Time   INR 0.9 10/04/2024 1537     Intake/Output Summary (Last 24 hours) at 10/08/2024 0804 Last data filed at 10/08/2024 0646 Gross per 24 hour  Intake 580 ml  Output 1550 ml  Net -970 ml     Assessment/Plan:  89 y.o. female with LLE CLI and contracted L leg  Plan is for L AKA tomorrow in the OR with Dr. Pearline.  Case was discussed in detail with the patient and she is agreeable to proceed.  NPO past midnight.  Hold heparin  at midnight.  Consent ordered.    Donnice Sender, PA-C [facility] [phone] 10/08/2024 8:04 AM    "

## 2024-10-08 NOTE — Plan of Care (Signed)

## 2024-10-08 NOTE — Care Management Important Message (Signed)
 Important Message  Patient Details  Name: [name] MRN: [id number] Date of Birth: 12-18-1932   Important Message Given:  Yes - Medicare IM     Jennie Laneta Dragon 10/08/2024, 1:04 PM

## 2024-10-08 NOTE — Progress Notes (Signed)
 PHARMACY - ANTICOAGULATION  Pharmacy Consult for Heparin  Indication: ischemic leg   Allergies[1]  Patient Measurements: Height: 5' 9 (175.3 cm) Weight: 65 kg (143 lb 4.8 oz) IBW/kg (Calculated) : 66.2 HEPARIN  DW (KG): 65  Vital Signs: Temp: 98 F (36.7 C) (01/12 0900) Temp Source: Oral (01/12 0900) BP: 153/59 (01/12 0900) Pulse Rate: 65 (01/12 0900)  Labs: Recent Labs    10/06/24 0531 10/07/24 0702 10/08/24 0702  HGB 11.1* 11.0* 11.4*  HCT 34.6* 34.1* 35.2*  PLT 220 198 199  HEPARINUNFRC 0.69 0.50 0.38  CREATININE 0.92 0.82 0.90    Estimated Creatinine Clearance: 43.5 mL/min (by C-G formula based on SCr of 0.9 mg/dL).  Assessment: 89 y.o. female with PAD and R AKA presenting with LLE ischemia. No anticoagulation prior to admission. Pharmacy consulted for heparin .    Heparin  level therapeutic at x2 on 950 units/hr. No bleeding noted, CBC stable. Plan is for left AKA Tues, 1/13.  Goal of Therapy:  Heparin  level 0.3-0.7 units/ml Monitor platelets by anticoagulation protocol: Yes   Plan:  Continue heparin  infusion at 950 units/hr - off at 1/13 00:00 for 1/13 OR (L AKA) Check heparin  level daily while on heparin  Continue to monitor H&H and platelets  Thank you for allowing pharmacy to be a part of this patients care.  Shelba Collier, PharmD, BCPS Clinical Pharmacist    [1] No Known Allergies

## 2024-10-09 ENCOUNTER — Ambulatory Visit: Admitting: Gastroenterology

## 2024-10-09 ENCOUNTER — Inpatient Hospital Stay (HOSPITAL_COMMUNITY): Admitting: Anesthesiology

## 2024-10-09 ENCOUNTER — Encounter (HOSPITAL_COMMUNITY)
Admission: EM | Disposition: A | Discharge status: Skilled Nursing Facility | Payer: Self-pay | Source: Skilled Nursing Facility | Attending: Internal Medicine

## 2024-10-09 ENCOUNTER — Encounter (HOSPITAL_COMMUNITY): Payer: Self-pay | Admitting: Internal Medicine

## 2024-10-09 DIAGNOSIS — Z7409 Other reduced mobility: Secondary | ICD-10-CM

## 2024-10-09 DIAGNOSIS — F418 Other specified anxiety disorders: Secondary | ICD-10-CM

## 2024-10-09 DIAGNOSIS — M86172 Other acute osteomyelitis, left ankle and foot: Secondary | ICD-10-CM

## 2024-10-09 DIAGNOSIS — I2510 Atherosclerotic heart disease of native coronary artery without angina pectoris: Secondary | ICD-10-CM

## 2024-10-09 DIAGNOSIS — M24562 Contracture, left knee: Secondary | ICD-10-CM

## 2024-10-09 DIAGNOSIS — I10 Essential (primary) hypertension: Secondary | ICD-10-CM

## 2024-10-09 DIAGNOSIS — L97529 Non-pressure chronic ulcer of other part of left foot with unspecified severity: Secondary | ICD-10-CM | POA: Diagnosis not present

## 2024-10-09 DIAGNOSIS — I70245 Atherosclerosis of native arteries of left leg with ulceration of other part of foot: Secondary | ICD-10-CM | POA: Diagnosis not present

## 2024-10-09 HISTORY — PX: AMPUTATION: SHX166

## 2024-10-09 LAB — CBC
HCT: 32.6 % — ABNORMAL LOW (ref 36.0–46.0)
HCT: 33.7 % — ABNORMAL LOW (ref 36.0–46.0)
Hemoglobin: 10.6 g/dL — ABNORMAL LOW (ref 12.0–15.0)
Hemoglobin: 10.9 g/dL — ABNORMAL LOW (ref 12.0–15.0)
MCH: 28.3 pg (ref 26.0–34.0)
MCH: 28.6 pg (ref 26.0–34.0)
MCHC: 32.5 g/dL (ref 30.0–36.0)
MCHC: 32.3 g/dL (ref 30.0–36.0)
MCV: 87.2 fL (ref 80.0–100.0)
MCV: 88.5 fL (ref 80.0–100.0)
Platelets: 193 K/uL (ref 150–400)
Platelets: 244 K/uL (ref 150–400)
RBC: 3.74 MIL/uL — ABNORMAL LOW (ref 3.87–5.11)
RBC: 3.81 MIL/uL — ABNORMAL LOW (ref 3.87–5.11)
RDW: 14.8 % (ref 11.5–15.5)
RDW: 14.8 % (ref 11.5–15.5)
WBC: 6.4 K/uL (ref 4.0–10.5)
WBC: 15 K/uL — ABNORMAL HIGH (ref 4.0–10.5)
nRBC: 0 % (ref 0.0–0.2)
nRBC: 0 % (ref 0.0–0.2)

## 2024-10-09 LAB — CREATININE, SERUM
Creatinine, Ser: 0.76 mg/dL (ref 0.44–1.00)
GFR, Estimated: >60 mL/min

## 2024-10-09 MED ORDER — PHENYLEPHRINE 80 MCG/ML (10ML) SYRINGE FOR IV PUSH (FOR BLOOD PRESSURE SUPPORT)
PREFILLED_SYRINGE | INTRAVENOUS | Status: DC | PRN
  Administered 2024-10-09 (×3): 160 ug via INTRAVENOUS
  Administered 2024-10-09: 80 ug via INTRAVENOUS

## 2024-10-09 MED ORDER — FENTANYL CITRATE (PF) 100 MCG/2ML IJ SOLN
INTRAMUSCULAR | Status: AC
  Filled 2024-10-09: qty 2

## 2024-10-09 MED ORDER — PROPOFOL 10 MG/ML IV BOLUS
INTRAVENOUS | Status: DC | PRN
  Administered 2024-10-09: 30 mg via INTRAVENOUS
  Administered 2024-10-09: 100 mg via INTRAVENOUS
  Administered 2024-10-09: 30 mg via INTRAVENOUS

## 2024-10-09 MED ORDER — PROPOFOL 10 MG/ML IV BOLUS
INTRAVENOUS | Status: AC
  Filled 2024-10-09: qty 20

## 2024-10-09 MED ORDER — FENTANYL CITRATE (PF) 250 MCG/5ML IJ SOLN
INTRAMUSCULAR | Status: DC | PRN
  Administered 2024-10-09 (×2): 25 ug via INTRAVENOUS
  Administered 2024-10-09: 50 ug via INTRAVENOUS

## 2024-10-09 MED ORDER — ONDANSETRON HCL 4 MG/2ML IJ SOLN
INTRAMUSCULAR | Status: AC
  Filled 2024-10-09: qty 2

## 2024-10-09 MED ORDER — FENTANYL CITRATE (PF) 100 MCG/2ML IJ SOLN
25.0000 ug | INTRAMUSCULAR | Status: DC | PRN
  Administered 2024-10-09 (×3): 25 ug via INTRAVENOUS

## 2024-10-09 MED ORDER — LIDOCAINE 2% (20 MG/ML) 5 ML SYRINGE
INTRAMUSCULAR | Status: DC | PRN
  Administered 2024-10-09: 60 mg via INTRAVENOUS

## 2024-10-09 MED ORDER — LIDOCAINE 2% (20 MG/ML) 5 ML SYRINGE
INTRAMUSCULAR | Status: AC
  Filled 2024-10-09: qty 5

## 2024-10-09 MED ORDER — MORPHINE SULFATE (PF) 2 MG/ML IV SOLN
0.5000 mg | INTRAVENOUS | Status: DC | PRN
  Administered 2024-10-09 (×3): 1 mg via INTRAVENOUS
  Filled 2024-10-09 (×4): qty 1

## 2024-10-09 MED ORDER — ORAL CARE MOUTH RINSE: 15.0000 mL | Freq: Once | OROMUCOSAL | Status: DC

## 2024-10-09 MED ORDER — ONDANSETRON HCL 4 MG/2ML IJ SOLN
4.0000 mg | Freq: Once | INTRAMUSCULAR | Status: AC | PRN
  Administered 2024-10-09: 4 mg via INTRAVENOUS

## 2024-10-09 MED ORDER — OXYCODONE HCL 5 MG/5ML PO SOLN: 5.0000 mg | Freq: Once | ORAL | Status: DC | PRN

## 2024-10-09 MED ORDER — ACETAMINOPHEN 500 MG PO TABS
500.0000 mg | ORAL_TABLET | Freq: Four times a day (QID) | ORAL | Status: DC
  Administered 2024-10-09 – 2024-10-10 (×3): 500 mg via ORAL
  Filled 2024-10-09 (×3): qty 1

## 2024-10-09 MED ORDER — CHLORHEXIDINE GLUCONATE 0.12 % MT SOLN
OROMUCOSAL | Status: AC
  Filled 2024-10-09: qty 15

## 2024-10-09 MED ORDER — ONDANSETRON HCL 4 MG/2ML IJ SOLN
INTRAMUSCULAR | Status: DC | PRN
  Administered 2024-10-09: 4 mg via INTRAVENOUS

## 2024-10-09 MED ORDER — OXYCODONE HCL 5 MG PO TABS: 5.0000 mg | ORAL_TABLET | Freq: Once | ORAL | Status: DC | PRN

## 2024-10-09 MED ORDER — 0.9 % SODIUM CHLORIDE (POUR BTL) OPTIME
TOPICAL | Status: DC | PRN
  Administered 2024-10-09: 2000 mL

## 2024-10-09 MED ORDER — LACTATED RINGERS IV SOLN: INTRAVENOUS | Status: DC

## 2024-10-09 MED ORDER — CHLORHEXIDINE GLUCONATE 0.12 % MT SOLN: 15.0000 mL | Freq: Once | OROMUCOSAL | Status: DC

## 2024-10-09 MED ORDER — HYDROCODONE-ACETAMINOPHEN 5-325 MG PO TABS
1.0000 | ORAL_TABLET | ORAL | Status: DC | PRN
  Administered 2024-10-09: 1 via ORAL
  Administered 2024-10-10 – 2024-10-12 (×8): 2 via ORAL
  Filled 2024-10-09: qty 2
  Filled 2024-10-09: qty 1
  Filled 2024-10-09 (×3): qty 2
  Filled 2024-10-09: qty 1
  Filled 2024-10-09 (×4): qty 2

## 2024-10-09 MED ORDER — HYDROCODONE-ACETAMINOPHEN 7.5-325 MG PO TABS: 1.0000 | ORAL_TABLET | ORAL | Status: DC | PRN

## 2024-10-09 MED ORDER — PHENYLEPHRINE 80 MCG/ML (10ML) SYRINGE FOR IV PUSH (FOR BLOOD PRESSURE SUPPORT)
PREFILLED_SYRINGE | INTRAVENOUS | Status: AC
  Filled 2024-10-09: qty 10

## 2024-10-09 MED ORDER — ACETAMINOPHEN 500 MG PO TABS: 1000.0000 mg | ORAL_TABLET | Freq: Once | ORAL | Status: DC

## 2024-10-09 MED ORDER — POTASSIUM CHLORIDE CRYS ER 20 MEQ PO TBCR: 40.0000 meq | EXTENDED_RELEASE_TABLET | Freq: Every day | ORAL | Status: DC | PRN

## 2024-10-09 MED ORDER — HEPARIN SODIUM (PORCINE) 5000 UNIT/ML IJ SOLN
5000.0000 [IU] | Freq: Three times a day (TID) | INTRAMUSCULAR | Status: DC
  Administered 2024-10-10 – 2024-10-12 (×7): 5000 [IU] via SUBCUTANEOUS
  Filled 2024-10-09 (×7): qty 1

## 2024-10-09 NOTE — Plan of Care (Signed)
" °  Problem: Education: Goal: Knowledge of General Education information will improve Description: Including pain rating scale, medication(s)/side effects and non-pharmacologic comfort measures Outcome: Progressing   Problem: Clinical Measurements: Goal: Will remain free from infection Outcome: Progressing   Problem: Nutrition: Goal: Adequate nutrition will be maintained Outcome: Progressing   Problem: Elimination: Goal: Will not experience complications related to urinary retention Outcome: Progressing   Problem: Safety: Goal: Ability to remain free from injury will improve Outcome: Progressing   Problem: Health Behavior/Discharge Planning: Goal: Ability to manage health-related needs will improve Outcome: Not Progressing   Problem: Activity: Goal: Risk for activity intolerance will decrease Outcome: Not Progressing   Problem: Pain Managment: Goal: General experience of comfort will improve and/or be controlled Outcome: Not Progressing   "

## 2024-10-09 NOTE — Anesthesia Preprocedure Evaluation (Addendum)
"                                    Anesthesia Evaluation  Patient identified by MRN, date of birth, ID band Patient awake    Reviewed: Allergy & Precautions, NPO status , Patient's Chart, lab work & pertinent test results, reviewed documented beta blocker date and time   History of Anesthesia Complications Negative for: history of anesthetic complications  Airway Mallampati: III  TM Distance: >3 FB Neck ROM: Full    Dental  (+) Edentulous Upper, Edentulous Lower   Pulmonary former smoker   Pulmonary exam normal        Cardiovascular hypertension, Pt. on medications and Pt. on home beta blockers + CAD and + Peripheral Vascular Disease  Normal cardiovascular exam+ pacemaker + Valvular Problems/Murmurs    S/p TAVR  '24 TTE - EF 60 to 65%. There is mild concentric left ventricular hypertrophy. Grade I diastolic dysfunction (impaired relaxation). Left atrial size was severely dilated. Right atrial size was mildly dilated. Echo findings are consistent with normal structure and function of the aortic valve prosthesis. Aortic valve area, by VTI measures 1.54 cm. Aortic valve mean gradient measures 12.0 mmHg. Aortic valve Vmax measures 2.20 m/s.     Neuro/Psych  PSYCHIATRIC DISORDERS Anxiety Depression    negative neurological ROS     GI/Hepatic Neg liver ROS,GERD  Medicated and Controlled,,  Endo/Other  Hypothyroidism    Renal/GU negative Renal ROS     Musculoskeletal  (+) Arthritis ,    Abdominal   Peds  Hematology  (+) Blood dyscrasia, anemia   Anesthesia Other Findings   Reproductive/Obstetrics                              Anesthesia Physical Anesthesia Plan  ASA: 4  Anesthesia Plan: General   Post-op Pain Management: Tylenol  PO (pre-op)* and Dilaudid  IV   Induction: Intravenous  PONV Risk Score and Plan: 3 and Treatment may vary due to age or medical condition, Ondansetron  and TIVA  Airway Management Planned:  LMA  Additional Equipment: None  Intra-op Plan:   Post-operative Plan: Extubation in OR  Informed Consent: I have reviewed the patients History and Physical, chart, labs and discussed the procedure including the risks, benefits and alternatives for the proposed anesthesia with the patient or authorized representative who has indicated his/her understanding and acceptance.     Dental advisory given  Plan Discussed with: CRNA and Anesthesiologist  Anesthesia Plan Comments:          Anesthesia Quick Evaluation  "

## 2024-10-09 NOTE — Progress Notes (Signed)
 PT Cancellation Note  Patient Details Name: [name] MRN: [id number] DOB: 1933-08-03   Cancelled Treatment:    Reason Eval/Treat Not Completed: (P) Patient at procedure or test/unavailable (pt at OR for L AKA). Will notify supervising PT pt due for re-evaluation post-op as schedule permits.   [name] M [name] 10/09/2024, 10:11 AM

## 2024-10-09 NOTE — Progress Notes (Signed)
 " PROGRESS NOTE  [name] FMW:[id number] DOB: Feb 10, 1933 DOA: 10/04/2024 PCP: Orpha Yancey LABOR, MD   LOS: 5 days   Brief narrative:   [name] is a 89 y.o. female with medical history significant for COPD, aortic stenosis, peripheral artery disease, right above-knee amputation presented to hospital from skilled nursing facility with swelling and redness of her left lower extremity for several months.  CT angiogram showed no occlusion but severe diffuse narrowing of the left superficial femoral artery noted secondary to extensive atheromatous disease, left popliteal artery is incompletely visualized due to scatter artifact arising from left knee arthroplasty.  Vascular surgery was consulted, was initially on heparin  drip and was admitted hospital for surgical intervention with above-knee amputation.  Patient underwent left above-knee amputation on 10/09/2024 by vascular surgery.       Assessment/Plan: Principal Problem:   Peripheral arterial disease Active Problems:   Acquired hypothyroidism   Severe aortic stenosis   History of COPD   S/P AKA (above knee amputation) unilateral, right (HCC)   Pacemaker   Left foot cellulitis Initially received IV Rocephin , leg elevation and supportive care.  Patient then underwent left above-knee amputation on 10/09/2024 by vascular surgery.  Will discontinue IV Rocephin .  Peripheral artery disease- History of right above-knee amputation in the past.  CTA aorta plus bifemoral- No evidence of significant aortoiliac stenosis or occlusion. Severe diffuse narrowing of left superficial femoral artery is noted secondary to extensive atheromatous disease. Left popliteal artery is incompletely visualized due to scatter artifact arising from left knee arthroplasty.    ABI of the left lower extremity showed critical left leg ischemia.  Vascular surgery determined not a revascularization candidate so underwent left above-knee amputation on 10/09/2024.   Further plans as per vascular surgery.   Constipation Continue bowel regimen   COPD Continue bronchodilators and supportive care   Hypothyroidism Continue Synthroid   DVT prophylaxis: heparin  injection 5,000 Units Start: 10/10/24 0600Heparin drip   Disposition: Skilled nursing facility after surgical intervention, will need PT evaluation  Status is: Inpatient Remains inpatient appropriate because: Status post left knee above amputation    Code Status:     Code Status: Full Code  Family Communication: Spoke with the patient's friend at bedside on 10/07/2024  Consultants:  Vascular surgery  Procedures: Left above-knee amputation on 10/09/2024  Anti-infectives:  Rocephin  IV-will discontinue  Anti-infectives (From admission, onward)    Start     Dose/Rate Route Frequency Ordered Stop   10/04/24 2300  cefTRIAXone  (ROCEPHIN ) 2 g in sodium chloride  0.9 % 100 mL IVPB        2 g 200 mL/hr over 30 Minutes Intravenous Every 24 hours 10/04/24 2152         Subjective: Today, patient was seen and examined at bedside.  Patient was seen after surgical intervention.  Feels sleepy.  Denies overt pain shortness of breath dyspnea.  Objective: Vitals:   10/09/24 1115 10/09/24 1135  BP: (!) 122/52 (!) 123/53  Pulse: 77 78  Resp: 11 18  Temp: 98.5 F (36.9 C) 98 F (36.7 C)  SpO2: 95% 96%    Intake/Output Summary (Last 24 hours) at 10/09/2024 1158 Last data filed at 10/09/2024 1004 Gross per 24 hour  Intake 500 ml  Output --  Net 500 ml   Filed Weights   10/04/24 1439  Weight: 65 kg   Body mass index is 21.16 kg/m.   Physical Exam:  General:  Average built, not in obvious distress, elderly female, HENT:  No scleral pallor or icterus noted. Oral mucosa is moist.  Chest: Diminished breath sounds bilaterally. CVS: S1 &S2 heard. No murmur.  Regular rate and rhythm. Abdomen: Soft, nontender, nondistended.  Bowel sounds are heard.   Extremities: No cyanosis, clubbing  or edema.  Peripheral pulses are palpable. Psych: Alert, awake and oriented, normal mood CNS:  No cranial nerve deficits.  Right above-knee amputation, status post left above-knee amputation with dressing. Skin: Warm and dry.  Left above-knee amputation with dressing.   Data Review: I have personally reviewed the following laboratory data and studies,  CBC: Recent Labs  Lab 10/04/24 1537 10/05/24 0551 10/06/24 0531 10/07/24 0702 10/08/24 0702 10/09/24 0633  WBC 7.1 7.0 6.1 5.4 5.8 6.4  NEUTROABS 4.4  --   --   --   --   --   HGB 11.4* 10.8* 11.1* 11.0* 11.4* 10.6*  HCT 36.3 33.0* 34.6* 34.1* 35.2* 32.6*  MCV 89.9 86.6 88.7 88.3 88.4 87.2  PLT 247 220 220 198 199 193   Basic Metabolic Panel: Recent Labs  Lab 10/04/24 1537 10/05/24 0551 10/06/24 0531 10/07/24 0702 10/08/24 0702  NA 137 140 141 139 139  K 4.5 3.9 4.1 4.1 4.1  CL 100 104 105 106 106  CO2 26 27 27 23 23   GLUCOSE 96 115* 86 87 81  BUN 42* 38* 31* 24* 22  CREATININE 1.01* 0.97 0.92 0.82 0.90  CALCIUM  9.1 8.8* 9.2 9.2 9.0  MG  --   --   --   --  2.3   Liver Function Tests: No results for input(s): AST, ALT, ALKPHOS, BILITOT, PROT, ALBUMIN in the last 168 hours. No results for input(s): LIPASE, AMYLASE in the last 168 hours. No results for input(s): AMMONIA in the last 168 hours. Cardiac Enzymes: No results for input(s): CKTOTAL, CKMB, CKMBINDEX, TROPONINI in the last 168 hours. BNP (last 3 results) No results for input(s): BNP in the last 8760 hours.  ProBNP (last 3 results) No results for input(s): PROBNP in the last 8760 hours.  CBG: No results for input(s): GLUCAP in the last 168 hours. Recent Results (from the past 240 hours)  Surgical pcr screen     Status: None   Collection Time: 10/07/24  6:15 PM   Specimen: Nasal Mucosa; Nasal Swab  Result Value Ref Range Status   MRSA, PCR NEGATIVE NEGATIVE Final   Staphylococcus aureus NEGATIVE NEGATIVE Final    Comment:  (NOTE) The Xpert SA Assay (FDA approved for NASAL specimens in patients 66 years of age and older), is one component of a comprehensive surveillance program. It is not intended to diagnose infection nor to guide or monitor treatment. Performed at [facility], [address] [place], [place] [address]      Studies: No results found.     Koty Anctil, MD  [facility] 10/09/2024  If 7PM-7AM, please contact night-coverage             "

## 2024-10-09 NOTE — Anesthesia Procedure Notes (Signed)
 Procedure Name: LMA Insertion Date/Time: 10/09/2024 9:05 AM  Performed by: Alen Motto D, CRNAPre-anesthesia Checklist: Patient identified, Emergency Drugs available, Suction available and Patient being monitored Patient Re-evaluated:Patient Re-evaluated prior to induction Oxygen Delivery Method: Circle System Utilized Preoxygenation: Pre-oxygenation with 100% oxygen Induction Type: IV induction Ventilation: Mask ventilation without difficulty LMA: LMA inserted LMA Size: 4.0 Number of attempts: 1 Airway Equipment and Method: Bite block Placement Confirmation: positive ETCO2 Tube secured with: Tape Dental Injury: Teeth and Oropharynx as per pre-operative assessment

## 2024-10-09 NOTE — Transfer of Care (Signed)
 Immediate Anesthesia Transfer of Care Note  Patient: [name]  Procedure(s) Performed: AMPUTATION, ABOVE KNEE (Left: Knee)  Patient Location: PACU  Anesthesia Type:General  Level of Consciousness: awake, alert , and oriented  Airway & Oxygen Therapy: Patient Spontanous Breathing  Post-op Assessment: Report given to RN and Post -op Vital signs reviewed and stable  Post vital signs: Reviewed and stable  Last Vitals:  Vitals Value Taken Time  BP 142/73 10/09/24 10:19  Temp    Pulse 72 10/09/24 10:21  Resp 10 10/09/24 10:21  SpO2 100 % 10/09/24 10:21  Vitals shown include unfiled device data.  Last Pain:  Vitals:   10/09/24 0745  TempSrc: Oral  PainSc: 0-No pain      Patients Stated Pain Goal: 3 (10/09/24 0745)  Complications: There were no known notable events for this encounter.

## 2024-10-09 NOTE — Plan of Care (Signed)
   Problem: Activity: Goal: Risk for activity intolerance will decrease Outcome: Progressing   Problem: Safety: Goal: Ability to remain free from injury will improve Outcome: Progressing   Problem: Skin Integrity: Goal: Risk for impaired skin integrity will decrease Outcome: Progressing

## 2024-10-09 NOTE — Discharge Instructions (Signed)
[facility] [place]  Discharge instructions  Lower Extremity Amputation  Please refer to the following instruction for your post-procedure care. Your surgeon or physician assistant will discuss any changes with you.  Activity  You are encouraged to walk as much as you can. You can slowly return to normal activities during the month after your surgery. Avoid strenuous activity and heavy lifting until your doctor tells you it's OK. Avoid activities such as vacuuming or swinging a golf club. Do not drive until your doctor give the OK and you are no longer taking prescription pain medications. It is also normal to have difficulty with sleep habits, eating and bowel movement after surgery. These will go away with time.  Bathing/Showering  Shower daily after you go home. Do not soak in a bathtub, hot tub, or swim until the incision heals completely.  Incision Care  Clean your incision with mild soap and water. Shower every day. Pat the area dry with a clean towel. You do not need a bandage unless otherwise instructed. Do not apply any ointments or creams to your incision. If you have open wounds you will be instructed how to care for them or a visiting nurse may be arranged for you. If you have staples or sutures along your incision they will be removed at your post-op appointment. You may have skin glue on your incision. Do not peel it off. It will come off on its own in about one week.  Diet  Resume your normal diet. There are no special food restrictions following this procedure. A low fat/ low cholesterol diet is recommended for all patients with vascular disease. In order to heal from your surgery, it is CRITICAL to get adequate nutrition. Your body requires vitamins, minerals, and protein. Vegetables are the best source of vitamins and minerals. Vegetables also provide the perfect balance of protein. Processed food has little nutritional value, so try to avoid  this.  Medications  Resume taking all your medications unless your doctor or physician assistant tells you not to. If your incision is causing pain, you may take over-the-counter pain relievers such as acetaminophen (Tylenol). If you were prescribed a stronger pain medication, please aware these medication can cause nausea and constipation. Prevent nausea by taking the medication with a snack or meal. Avoid constipation by drinking plenty of fluids and eating foods with high amount of fiber, such as fruits, vegetables, and grains. Take Colace 100 mg (an over-the-counter stool softener) twice a day as needed for constipation.  Do not take Tylenol if you are taking prescription pain medications.  Follow Up  Our office will schedule a follow up appointment 4 weeks following discharge.  Please call us immediately for any of the following conditions  Increase pain, redness, warmth, or drainage (pus) from your incision site(s) Fever of 101 degree or higher The swelling in your leg with the amputation suddenly worsens and becomes more painful than when you were in the hospital  Leg swelling is common after amputation surgery.  The swelling should improve over a few months following surgery. To improve the swelling, you may elevate your legs above the level of your heart while you are sitting or resting. Your surgeon or physician assistant may ask you to apply an ACE wrap or wear compression (TED) stockings to help to reduce swelling.  Reduce your risk of vascular disease  Stop smoking. If you would like help call [facility] at 1-800-QUIT-NOW (1-800-784-8669) or [place] at 336-586-4000.  Manage   your cholesterol Maintain a desired weight Control your diabetes weight Control your diabetes Keep your blood pressure down  If you have any questions, please call the office at [phone] 

## 2024-10-09 NOTE — Progress Notes (Signed)
" °  Progress Note    10/09/2024 8:42 AM * Day of Surgery *  Subjective:  no complaints   Vitals:   10/09/24 0513 10/09/24 0745  BP: (!) 149/44 (!) 154/50  Pulse: 64 73  Resp: 18 16  Temp: 98.5 F (36.9 C) 98.2 F (36.8 C)  SpO2: 97% 95%   Physical Exam: Lungs:  non labored Extremities:  left leg contracted, hanging off the bed Neurologic: A&O  CBC    Component Value Date/Time   WBC 6.4 10/09/2024 0633   RBC 3.74 (L) 10/09/2024 0633   HGB 10.6 (L) 10/09/2024 0633   HGB 13.4 06/23/2020 1500   HCT 32.6 (L) 10/09/2024 0633   HCT 41.7 06/23/2020 1500   PLT 193 10/09/2024 0633   PLT 282 06/23/2020 1500   MCV 87.2 10/09/2024 0633   MCV 86 06/23/2020 1500   MCH 28.3 10/09/2024 0633   MCHC 32.5 10/09/2024 0633   RDW 14.8 10/09/2024 0633   RDW 13.4 06/23/2020 1500   LYMPHSABS 1.9 10/04/2024 1537   MONOABS 0.6 10/04/2024 1537   EOSABS 0.1 10/04/2024 1537   BASOSABS 0.1 10/04/2024 1537    BMET    Component Value Date/Time   NA 139 10/08/2024 0702   NA 144 06/30/2022 1650   K 4.1 10/08/2024 0702   CL 106 10/08/2024 0702   CO2 23 10/08/2024 0702   GLUCOSE 81 10/08/2024 0702   BUN 22 10/08/2024 0702   BUN 17 06/30/2022 1650   CREATININE 0.90 10/08/2024 0702   CALCIUM  9.0 10/08/2024 0702   GFRNONAA >60 10/08/2024 0702   GFRAA 87 06/23/2020 1500    INR    Component Value Date/Time   INR 0.9 10/04/2024 1537    No intake or output data in the 24 hours ending 10/09/24 0842    Assessment/Plan:  89 y.o. female with LLE CLI and contracted L leg  Plan is for L AKA today. After discussing the risks and benefits, patient and daughter elected to proceed.     "

## 2024-10-09 NOTE — Progress Notes (Signed)
 Inpatient Rehab Admissions Coordinator:   Consult received and chart reviewed.  She has been non ambulatory with BLE flexion contractures since at least 2024, previous R AKA in 02/2023.  She requires +2 for transfers with a slideboard at baseline.  Sometimes uses a hoyer.  She is a resident of [facility] and presented to [facility] for painful left toe and no palpable pulse.  She underwent a L AKA on 1/13.  Post op therapy evaluations pending.  Based on current payor trends and prior level of function, I do not believe she is a candidate for CIR. Agree with PT pre-op recommendations of return to SNF.  Will sign off.   Reche Lowers, PT, DPT Admissions Coordinator [phone] 10/09/2024 1:29 PM

## 2024-10-09 NOTE — Op Note (Signed)
" ° ° °  NAME: [name]    MRN: [id number] DOB: Aug 05, 1933    DATE OF OPERATION: 10/09/2024  PREOP DIAGNOSIS:    Left leg above knee amputation  POSTOP DIAGNOSIS:    Same  PROCEDURE:    Left leg above knee amputation  SURGEON: Fonda FORBES Rim  ASSIST: Curry Damme, PA  ANESTHESIA: General   EBL:  INDICATIONS:    [name] is a 89 y.o. female with prior history of right sided above-knee amputation who presents with contracture, tissue loss in the left lower extremity.  She is not a revascularization candidate as she no longer walks.  I have discussed the risks and benefits of left lower extremity above-knee amputation, [name] elected to proceed  FINDINGS:   Healthy muscles in the left thigh.  TECHNIQUE:   Patient was brought to the OR laid in the supine position.  General anesthesia was induced and the patient was prepped and draped in standard fashion.  A timeout was performed.  A fish-mouth incision line was drawn 7cm from the patella for the left above-knee amputation. Sharp dissection was used to the femur. Thigh muscles were divided and femoral artery and vein identified.  These were oversewn using two, 2-0 silk pop stick ties. Once the circumferential exposure of the femur was complete, a reciprocating saw was used to divide the femur. This was followed by amputation knife through the back of the thigh.  Next, a periosteal elevator and cautery were used to expose 5cm more of the proximal femur.  Rakes were used to hold the thigh muscles and the reciprocating saw was once again used to divide the femur.   The tourniquet was released and bleeding vessels oversewn using 2-0 silk suture. The sciatic nerve was identified, pulled to length and ligated with a zero silk tie. Once hemostasis was achieved, warm saline was brought to the field and the wound bed was irrigated.  I used a 0 Vicryl to close the periosteal tissue around the femur.  There was no  bleeding from the marrow.  Next, my attention turned to closure of the fascial layer.  This was done using 2-0 Vicryl pop suture.  The fascial layer was closed along the entirety of the incision.  A stapler was brought to the field to oppose the dermis.  An occlusive dressing was placed and the patient was taken the PACU in stable condition.  Given the complexity of the case a first assistant was necessary in order to expedient the procedure and safely perform the technical aspects of the operation.   Fonda FORBES Rim, MD [facility] DATE OF DICTATION:   10/09/2024  "

## 2024-10-10 ENCOUNTER — Encounter (HOSPITAL_COMMUNITY): Payer: Self-pay | Admitting: Vascular Surgery

## 2024-10-10 DIAGNOSIS — I739 Peripheral vascular disease, unspecified: Secondary | ICD-10-CM | POA: Diagnosis not present

## 2024-10-10 LAB — CBC
HCT: 30.7 % — ABNORMAL LOW (ref 36.0–46.0)
Hemoglobin: 10 g/dL — ABNORMAL LOW (ref 12.0–15.0)
MCH: 29 pg (ref 26.0–34.0)
MCHC: 32.6 g/dL (ref 30.0–36.0)
MCV: 89 fL (ref 80.0–100.0)
Platelets: 204 K/uL (ref 150–400)
RBC: 3.45 MIL/uL — ABNORMAL LOW (ref 3.87–5.11)
RDW: 15.1 % (ref 11.5–15.5)
WBC: 7.4 K/uL (ref 4.0–10.5)
nRBC: 0 % (ref 0.0–0.2)

## 2024-10-10 LAB — BASIC METABOLIC PANEL WITH GFR
Anion gap: 9 (ref 5–15)
BUN: 26 mg/dL — ABNORMAL HIGH (ref 8–23)
CO2: 24 mmol/L (ref 22–32)
Calcium: 8.8 mg/dL — ABNORMAL LOW (ref 8.9–10.3)
Chloride: 105 mmol/L (ref 98–111)
Creatinine, Ser: 0.99 mg/dL (ref 0.44–1.00)
GFR, Estimated: 54 mL/min — ABNORMAL LOW
Glucose, Bld: 105 mg/dL — ABNORMAL HIGH (ref 70–99)
Potassium: 4.7 mmol/L (ref 3.5–5.1)
Sodium: 138 mmol/L (ref 135–145)

## 2024-10-10 LAB — SURGICAL PATHOLOGY

## 2024-10-10 LAB — MAGNESIUM: Magnesium: 2.1 mg/dL (ref 1.7–2.4)

## 2024-10-10 MED ORDER — SODIUM CHLORIDE 0.9 % IV SOLN: INTRAVENOUS | Status: AC

## 2024-10-10 MED ORDER — OXYBUTYNIN CHLORIDE ER 5 MG PO TB24
15.0000 mg | ORAL_TABLET | Freq: Every day | ORAL | Status: DC
  Administered 2024-10-10 – 2024-10-12 (×3): 15 mg via ORAL
  Filled 2024-10-10 (×3): qty 1

## 2024-10-10 MED ORDER — SENNOSIDES-DOCUSATE SODIUM 8.6-50 MG PO TABS: 1.0000 | ORAL_TABLET | Freq: Every day | ORAL | Status: DC

## 2024-10-10 MED ORDER — POLYETHYLENE GLYCOL 3350 17 G PO PACK
17.0000 g | PACK | Freq: Every day | ORAL | Status: DC
  Administered 2024-10-10 – 2024-10-12 (×3): 17 g via ORAL
  Filled 2024-10-10 (×3): qty 1

## 2024-10-10 MED ORDER — SENNOSIDES-DOCUSATE SODIUM 8.6-50 MG PO TABS
1.0000 | ORAL_TABLET | Freq: Every day | ORAL | Status: DC
  Administered 2024-10-10 – 2024-10-12 (×3): 1 via ORAL
  Filled 2024-10-10 (×3): qty 1

## 2024-10-10 MED ORDER — SODIUM CHLORIDE 0.9 % IV BOLUS
500.0000 mL | Freq: Once | INTRAVENOUS | Status: AC
  Administered 2024-10-10: 500 mL via INTRAVENOUS

## 2024-10-10 MED ORDER — POLYETHYLENE GLYCOL 3350 17 G PO PACK: 17.0000 g | PACK | Freq: Every day | ORAL | Status: DC

## 2024-10-10 NOTE — Progress Notes (Signed)
" °  Progress Note    10/10/2024 8:38 AM 1 Day Post-Op  Subjective: Says that her amputation site has not hurt since earlier this morning    Vitals:   10/10/24 0518 10/10/24 0736  BP: (!) 111/51 (!) 97/45  Pulse: 70 76  Resp: 16 16  Temp: 98.4 F (36.9 C) 98.4 F (36.9 C)  SpO2: 95% 95%    Physical Exam: General: Sitting up in bed, NAD Cardiac: Regular Lungs: Nonlabored Incisions: Left AKA bandaged and dry  CBC    Component Value Date/Time   WBC 7.4 10/10/2024 0536   RBC 3.45 (L) 10/10/2024 0536   HGB 10.0 (L) 10/10/2024 0536   HGB 13.4 06/23/2020 1500   HCT 30.7 (L) 10/10/2024 0536   HCT 41.7 06/23/2020 1500   PLT 204 10/10/2024 0536   PLT 282 06/23/2020 1500   MCV 89.0 10/10/2024 0536   MCV 86 06/23/2020 1500   MCH 29.0 10/10/2024 0536   MCHC 32.6 10/10/2024 0536   RDW 15.1 10/10/2024 0536   RDW 13.4 06/23/2020 1500   LYMPHSABS 1.9 10/04/2024 1537   MONOABS 0.6 10/04/2024 1537   EOSABS 0.1 10/04/2024 1537   BASOSABS 0.1 10/04/2024 1537    BMET    Component Value Date/Time   NA 138 10/10/2024 0536   NA 144 06/30/2022 1650   K 4.7 10/10/2024 0536   CL 105 10/10/2024 0536   CO2 24 10/10/2024 0536   GLUCOSE 105 (H) 10/10/2024 0536   BUN 26 (H) 10/10/2024 0536   BUN 17 06/30/2022 1650   CREATININE 0.99 10/10/2024 0536   CALCIUM  8.8 (L) 10/10/2024 0536   GFRNONAA 54 (L) 10/10/2024 0536   GFRAA 87 06/23/2020 1500    INR    Component Value Date/Time   INR 0.9 10/04/2024 1537     Intake/Output Summary (Last 24 hours) at 10/10/2024 0838 Last data filed at 10/10/2024 0715 Gross per 24 hour  Intake 980 ml  Output 300 ml  Net 680 ml      Assessment/Plan:  89 y.o. female is 1 day postop, s/p: Left AKA   - She is doing well this morning without any complaints.  She says that her amputation site was hurting earlier this morning, however now it is painless -Left AKA is bandaged and dry.  Will plan dressing takedown in the next couple of  days -Hemoglobin stable at 10.0 without evidence of further blood loss -Okay to mobilize as tolerated with PT/OT   Ahmed Holster, PA-C [facility] [phone] 10/10/2024 8:38 AM    "

## 2024-10-10 NOTE — Evaluation (Signed)
 Physical Therapy Re-Evaluation Patient Details Name: [name] MRN: [id number] DOB: 02/07/1933 Today's Date: 10/10/2024  History of Present Illness  89 y.o. female admitted from SNF rehab on 10/04/24 with LLE swelling for several months, chronic L 5th toe ischemic ulcer. Workup for L foot cellulitis, PAD. Pt s/p Lt AKA 1/13. PMH includes R AKA (2024), multiple BLE joint sxs, HTN, CAD, HFpEF, COPD, depression, anxiety.   Clinical Impression  Pt seen for re-evaluation after left transfemoral amputation. PTA, pt required assistance with functional mobility and ADLs. She resides at [facility]. Pt is a questionable historian as she reported conflicting information on different dates. She requires 1-2+ assist for slide board transfer to manual w/c, which she can self-propel. Staff may occasionally use a hoyer lift, but pt denied this today. Pt currently with functional limitations due to the deficits listed below (see PT Problem List). She required maxA x2 for bed mobility and totalA x2 for anterior-posterior transfer with use of bed pad. Pt is currently limited by decreased cognition, BLE weakness, impaired balance, and decreased activity tolerance. Pt will benefit from acute skilled PT to increase her independence and safety with mobility to allow discharge. Recommend continued inpatient follow up therapy, <3 hours/day.    If plan is discharge home, recommend the following: Two people to help with walking and/or transfers;A lot of help with bathing/dressing/bathroom;Assistance with cooking/housework;Assist for transportation   Can travel by private vehicle   No    Equipment Recommendations None recommended by PT  Recommendations for Other Services       Functional Status Assessment Patient has had a recent decline in their functional status and/or demonstrates limited ability to make significant improvements in function in a reasonable and predictable amount of time      Precautions / Restrictions Precautions Precautions: Fall Recall of Precautions/Restrictions: Impaired Restrictions Weight Bearing Restrictions Per Provider Order: Yes LLE Weight Bearing Per Provider Order: Non weight bearing      Mobility  Bed Mobility Overal bed mobility: Needs Assistance Bed Mobility: Supine to Sit, Sit to Supine, Rolling     Supine to sit: Max assist, +2 for physical assistance, HOB elevated, Used rails     General bed mobility comments: Pt sat up into long sitting by pulling on bilateral bed rails and assist of PT/OT via bed pad to elevate trunk. She frequently requested HOB to elevated more reporting it is much higher than this at home. Encouraged pt to attempt to maintain trunk/core strength. She demonstrated a posterior lean requiring maxA x2 to maintain upright. Instructed pt to pivot/turn in bed to be perpendicular in prepartion of anterior-posterior transfer. PT/OT utilized bed pad to help rotate pt with her holding onto bed rail. Dense cues to alter hand position to attempt to assist and emphasis on forward lean to offload buttocks. TotalA x2 to reposition pt ahead of transfer.    Transfers Overall transfer level: Needs assistance Equipment used: None Transfers: Bed to chair/wheelchair/BSC         Anterior-Posterior transfers: Total assist, +2 physical assistance   General transfer comment: Educated pt on head-hips relationship. Dense cues and physical assist to reposition BUE in order to aid in scooting. TotalA x2 with use of bed pad to scoot pt backwards into recliner chair positioned touching EOB.    Ambulation/Gait               General Gait Details: NT - pt nonambulatory since 2024  Stairs  Wheelchair Mobility     Tilt Bed    Modified Rankin (Stroke Patients Only)       Balance Overall balance assessment: Needs assistance Sitting-balance support: Bilateral upper extremity supported Sitting balance-Leahy  Scale: Poor Sitting balance - Comments: Pt required maxA x2 to maintain long sitting. She attempted to keep weight off LLE by leaning to the right. Postural control: Posterior lean, Right lateral lean                                   Pertinent Vitals/Pain Pain Assessment Pain Assessment: No/denies pain    Home Living Family/patient expects to be discharged to:: Skilled nursing facility                   Additional Comments: Resident at [facility] for ~2 years. Pt reports recieving therapy services off/on depending on insurance coverage.    Prior Function Prior Level of Function : Patient poor historian/Family not available;Needs assist (Pt may be a questionable/unreliable historian as she reported conflicting information compared to prior evaluation.)       Physical Assist : Mobility (physical);ADLs (physical) Mobility (physical): Bed mobility;Transfers;Gait ADLs (physical): Bathing;Dressing;Toileting;IADLs Mobility Comments: Pt reports 1+ assist with all functional mobility. She states the [facility] is significantly elevated and she can use bed rails to get EOB. Pt will use a slide board to transfer to w/c with removable arm rest. Able to self-proper w/c. Per PT eval (1/10) pt required +2 assist and occasionally used a hoyer lift, she denied that this date. ADLs Comments: uses pamper for toileting. sponge bathes bed level, staff completes bathing. Per PT eval (1/10) staff assist for bed level and wheelchair-level ADLs, enjoys crocheting.     Extremity/Trunk Assessment   Upper Extremity Assessment Upper Extremity Assessment: Defer to OT evaluation    Lower Extremity Assessment Lower Extremity Assessment: RLE deficits/detail;LLE deficits/detail RLE Deficits / Details: h/o R AKA in 2024. Decreased hip AROM and strength, grossly 2+/5. LLE Deficits / Details: Pt POD 1 s/p AKA. Decreased hip AROM. Pt with ace bandage wrapped around residual limb. Pt resisted  PROM attempts. Decreased strength, grossly 2/5. LLE: Unable to fully assess due to pain    Cervical / Trunk Assessment Cervical / Trunk Assessment: Kyphotic  Communication   Communication Communication: No apparent difficulties    Cognition Arousal: Alert Behavior During Therapy: Anxious   PT - Cognitive impairments: No family/caregiver present to determine baseline, Awareness, Attention, Initiation, Sequencing, Problem solving, Safety/Judgement                       PT - Cognition Comments: Pt nervous to mobilize d/t fear of pain. Pt verbose and tangential in conversation, decreased attention requiring redirection to current task/topic. She followed simple one step commands <10% time. Following commands: Impaired Following commands impaired: Follows one step commands inconsistently, Follows one step commands with increased time     Cueing Cueing Techniques: Verbal cues, Visual cues, Tactile cues     General Comments General comments (skin integrity, edema, etc.): Educated pt on ROM, stretching, and strengthening exercises as well as desensitization techniques. Provided pt with HEP (Medbridge Access Code: [email or web address]). She declined to attempt any additional LLE movement after the transfer. Discussed d/c planning and use of maximove to transfer currently with nursing.    Exercises     Assessment/Plan    PT Assessment Patient needs continued PT services  PT  Problem List Decreased strength;Decreased range of motion;Decreased activity tolerance;Decreased balance;Decreased mobility;Decreased knowledge of use of DME;Decreased skin integrity;Decreased safety awareness;Decreased knowledge of precautions       PT Treatment Interventions DME instruction;Functional mobility training;Therapeutic activities;Therapeutic exercise;Balance training;Patient/family education;Wheelchair mobility training    PT Goals (Current goals can be found in the Care Plan section)  Acute Rehab PT  Goals Patient Stated Goal: return to [facility] SNF PT Goal Formulation: With patient Time For Goal Achievement: 10/24/24 Potential to Achieve Goals: Fair    Frequency Min 1X/week     Co-evaluation PT/OT/SLP Co-Evaluation/Treatment: Yes Reason for Co-Treatment: Complexity of the patient's impairments (multi-system involvement);For patient/therapist safety;To address functional/ADL transfers PT goals addressed during session: Mobility/safety with mobility;Balance         AM-PAC PT 6 Clicks Mobility  Outcome Measure Help needed turning from your back to your side while in a flat bed without using bedrails?: A Lot Help needed moving from lying on your back to sitting on the side of a flat bed without using bedrails?: Total Help needed moving to and from a bed to a chair (including a wheelchair)?: Total Help needed standing up from a chair using your arms (e.g., wheelchair or bedside chair)?: Total Help needed to walk in hospital room?: Total Help needed climbing 3-5 steps with a railing? : Total 6 Click Score: 7    End of Session   Activity Tolerance: Patient tolerated treatment well;Patient limited by pain Patient left: in chair;with call bell/phone within reach;with chair alarm set Nurse Communication: Mobility status;Need for lift equipment (amputee lift pad underneath pt; maximove) PT Visit Diagnosis: Other abnormalities of gait and mobility (R26.89);Muscle weakness (generalized) (M62.81)    Time: 9081-9058 PT Time Calculation (min) (ACUTE ONLY): 23 min   Charges:   PT Evaluation $PT Re-evaluation: 1 Re-eval   PT General Charges $$ ACUTE PT VISIT: 1 Visit         Randall SAUNDERS, PT, DPT Acute Rehabilitation Services Office: [phone] Secure Chat Preferred  Delon CHRISTELLA Callander 10/10/2024, 11:11 AM

## 2024-10-10 NOTE — Evaluation (Addendum)
 Occupational Therapy Evaluation Patient Details Name: [name] MRN: [id number] DOB: [birth year] Today's Date: 10/10/2024   History of Present Illness   89 y.o. female admitted from SNF rehab on 10/04/24 with LLE swelling for several months, chronic L 5th toe ischemic ulcer. Workup for L foot cellulitis, PAD. Pt s/p Lt AKA 1/13. PMH includes R AKA (2024), multiple BLE joint sxs, HTN, CAD, HFpEF, COPD, depression, anxiety.     Clinical Impressions Pt presents from SNF where she required assistance with functional mobility and ADLs. Pt currently requires up to total A +2 for anterior-posterior transfer to recliner. Pt requires up to total A for ADL tasks as well. Pt is primarily limited by generalized weakness, decreased activity tolerance, impaired balance, and decreased cognition.OT to continue to follow Pt acutely to facilitate progress towards goals. Patient will benefit from continued inpatient follow up therapy, <3 hours/day.      If plan is discharge home, recommend the following:   Two people to help with walking and/or transfers;A lot of help with bathing/dressing/bathroom;Assistance with cooking/housework;Direct supervision/assist for medications management;Direct supervision/assist for financial management;Assist for transportation;Help with stairs or ramp for entrance     Functional Status Assessment   Patient has had a recent decline in their functional status and demonstrates the ability to make significant improvements in function in a reasonable and predictable amount of time.     Equipment Recommendations   Other (comment) (defer to next venue)     Recommendations for Other Services         Precautions/Restrictions   Precautions Precautions: Fall Recall of Precautions/Restrictions: Impaired Precaution/Restrictions Comments: h/o R AKA Restrictions Weight Bearing Restrictions Per Provider Order: Yes LLE Weight Bearing Per Provider Order: Non weight  bearing     Mobility Bed Mobility Overal bed mobility: Needs Assistance Bed Mobility: Supine to Sit, Sit to Supine, Rolling     Supine to sit: Max assist, +2 for physical assistance, HOB elevated, Used rails     General bed mobility comments: Pt sat up into long sitting by pulling on bilateral bed rails and assist of PT/OT via bed pad to elevate trunk. She frequently requested HOB to elevated more reporting it is much higher than this at home. Encouraged pt to attempt to maintain trunk/core strength. She demonstrated a posterior lean requiring maxA x2 to maintain upright. Instructed pt to pivot/turn in bed to be perpendicular in prepartion of anterior-posterior transfer. PT/OT utilized bed pad to help rotate pt with her holding onto bed rail. Dense cues to alter hand position to attempt to assist and emphasis on forward lean to offload buttocks. TotalA x2 to reposition pt ahead of transfer.    Transfers Overall transfer level: Needs assistance Equipment used: None Transfers: Bed to chair/wheelchair/BSC         Anterior-Posterior transfers: Total assist, +2 physical assistance   General transfer comment: Educated pt on head-hips relationship. Dense cues and physical assist to reposition BUE in order to aid in scooting. TotalA x2 with use of bed pad to scoot pt backwards into recliner chair positioned touching EOB. Pt with non-functional use of BUE during transfer. Pt provided with dense mulitmodal cues for hand placement but unable to maintain proper position.      Balance Overall balance assessment: Needs assistance Sitting-balance support: Bilateral upper extremity supported Sitting balance-Leahy Scale: Poor Sitting balance - Comments: Pt required maxA x2 to maintain long sitting. She attempted to keep weight off LLE by leaning to the right. Postural control: Posterior lean, Right lateral  lean     Standing balance comment: unable to stand                            ADL either performed or assessed with clinical judgement   ADL Overall ADL's : Needs assistance/impaired Eating/Feeding: Set up;Sitting   Grooming: Set up;Sitting   Upper Body Bathing: Moderate assistance;Sitting   Lower Body Bathing: Moderate assistance;Bed level   Upper Body Dressing : Moderate assistance   Lower Body Dressing: Moderate assistance   Toilet Transfer: Total assistance;+2 for physical assistance;+2 for safety/equipment;Anterior/posterior;BSC/3in1 Statistician Details (indicate cue type and reason): simulated Toileting- Clothing Manipulation and Hygiene: Total assistance               Vision   Vision Assessment?: Vision impaired- to be further tested in functional context Additional Comments: ? L visual field impairments. Phone on L side of tray table and Pt unable to locate item. Vision to be further tested in functional context     Perception Perception: Not tested       Praxis Praxis: Impaired Praxis Impairment Details: Motor planning, Organization, Initiation Praxis-Other Comments: Motor planning and coordination deficits noted during posterior transfer   Pertinent Vitals/Pain Pain Assessment Pain Assessment: No/denies pain     Extremity/Trunk Assessment Upper Extremity Assessment Upper Extremity Assessment: Generalized weakness (Pt endorses arthritis in BUE limiting functional use)   Lower Extremity Assessment Lower Extremity Assessment: Defer to PT evaluation RLE Deficits / Details: h/o R AKA in 2024. Decreased hip AROM and strength, grossly 2+/5. LLE Deficits / Details: Pt POD 1 s/p AKA. Decreased hip AROM. Pt with ace bandage wrapped around residual limb. Pt resisted PROM attempts. Decreased strength, grossly 2/5. LLE: Unable to fully assess due to pain   Cervical / Trunk Assessment Cervical / Trunk Assessment: Kyphotic   Communication Communication Communication: No apparent difficulties   Cognition Arousal: Alert Behavior  During Therapy: Anxious Cognition: Cognition impaired     Awareness: Online awareness impaired Memory impairment (select all impairments): Working memory Attention impairment (select first level of impairment): Sustained attention Executive functioning impairment (select all impairments): Initiation, Organization, Sequencing, Reasoning, Problem solving OT - Cognition Comments: Pt with difficulty initiating and oragnizing information to complete tasks. Occassional distraction from TV during conversation. Pt endorses information that differs from chart review.                 Following commands: Impaired Following commands impaired: Follows one step commands inconsistently, Follows one step commands with increased time     Cueing  General Comments   Cueing Techniques: Verbal cues;Visual cues;Tactile cues  Educated Pt on use of maximove to transfer with nursing.   Exercises     Shoulder Instructions      Home Living Family/patient expects to be discharged to:: Skilled nursing facility                                 Additional Comments: Resident at [facility] for ~2 years. Pt reports recieving therapy services off/on depending on insurance coverage.      Prior Functioning/Environment Prior Level of Function : Patient poor historian/Family not available;Needs assist (Pt may be a questionable/unreliable historian as she reported conflicting information compared to prior evaluation.)       Physical Assist : Mobility (physical);ADLs (physical) Mobility (physical): Bed mobility;Transfers;Gait ADLs (physical): Bathing;Dressing;Toileting;IADLs Mobility Comments: Pt reports 1+ assist with all  functional mobility. She states the [facility] is significantly elevated and she can use bed rails to get EOB. Pt will use a slide board to transfer to w/c with removable arm rest. Able to self-proper w/c. Per PT eval (1/10) pt required +2 assist and occasionally used a hoyer  lift, she denied that this date. ADLs Comments: uses pamper for toileting. sponge bathes bed level, staff completes bathing. Per PT eval (1/10) staff assist for bed level and wheelchair-level ADLs, enjoys crocheting.    OT Problem List: Decreased strength;Decreased activity tolerance;Impaired balance (sitting and/or standing);Decreased safety awareness;Decreased knowledge of use of DME or AE;Decreased knowledge of precautions;Impaired UE functional use;Decreased coordination;Decreased cognition   OT Treatment/Interventions: Self-care/ADL training;Therapeutic exercise;Energy conservation;DME and/or AE instruction;Therapeutic activities;Patient/family education;Balance training      OT Goals(Current goals can be found in the care plan section)   Acute Rehab OT Goals Patient Stated Goal: to get better OT Goal Formulation: With patient Time For Goal Achievement: 10/24/24 Potential to Achieve Goals: Good ADL Goals Pt Will Perform Upper Body Dressing: with set-up;sitting Pt Will Perform Lower Body Dressing: with min assist;bed level Pt Will Transfer to Toilet: with mod assist;anterior/posterior transfer;bedside commode Pt/caregiver will Perform Home Exercise Program: Increased strength;Both right and left upper extremity;With written HEP provided Additional ADL Goal #1: Pt will engage in bed mobility with CGA as a precursor to ADL tasks OOB   OT Frequency:  Min 1X/week    Co-evaluation PT/OT/SLP Co-Evaluation/Treatment: Yes Reason for Co-Treatment: Complexity of the patient's impairments (multi-system involvement);For patient/therapist safety;To address functional/ADL transfers PT goals addressed during session: Mobility/safety with mobility;Balance OT goals addressed during session: ADL's and self-care;Proper use of Adaptive equipment and DME;Strengthening/ROM      AM-PAC OT 6 Clicks Daily Activity     Outcome Measure Help from another person eating meals?: A Little Help from  another person taking care of personal grooming?: A Little Help from another person toileting, which includes using toliet, bedpan, or urinal?: Total Help from another person bathing (including washing, rinsing, drying)?: A Lot Help from another person to put on and taking off regular upper body clothing?: A Lot Help from another person to put on and taking off regular lower body clothing?: A Lot 6 Click Score: 13   End of Session Nurse Communication: Mobility status;Need for lift equipment  Activity Tolerance: Patient tolerated treatment well Patient left: in chair;with call bell/phone within reach;with chair alarm set  OT Visit Diagnosis: Other abnormalities of gait and mobility (R26.89);Muscle weakness (generalized) (M62.81)                Time: 9081-9058 OT Time Calculation (min): 23 min Charges:  OT General Charges $OT Visit: 1 Visit OT Evaluation $OT Eval Low Complexity: 1 Low  Maurilio CROME, OTR/L.  [facility] Acute Rehabilitation  Office: [phone]   Maurilio PARAS Joaovictor Krone 10/10/2024, 12:10 PM

## 2024-10-10 NOTE — Progress Notes (Signed)
 "  [name]  FMW:[id number] DOB: 08-27-1933 DOA: 10/04/2024 PCP: Orpha Yancey LABOR, MD    Brief Narrative:  89 year old with a history of COPD, aortic stenosis, PAD, and R AKA who presented to the hospital from her SNF 10/04/2024 with contractures, swelling, and redness of the left lower extremity which had progressively worsened over many weeks to months.  CTa of the lower extremity noted severe diffuse narrowing of the left superficial femoral artery with extensive atheromatous disease.  The patient was placed on a heparin  drip and vascular surgery was consulted.  Ultimately she required left AKA 10/09/2024.  She  Goals of Care:   Code Status: Full Code   DVT prophylaxis: heparin  injection 5,000 Units Start: 10/10/24 0600   Interim Hx: No acute events reported overnight.  Blood pressure modestly low with systolics 97-119.  Afebrile.  Vital signs otherwise stable.  Having some significant pain at her AKA stump at the time of my visit.  Otherwise pleasant and interactive.  Denies chest pain or shortness of breath.  In good spirits.  Assessment & Plan:  Ischemic ulceration left fifth toe -extensive left lower extremity PAD -status post L AKA 10/09/2024 Postoperative care per Vascular Surgery -patient was nonambulatory prior to this admission  Constipation Improving with bowel regimen  COPD Well compensated at present  Hypothyroidism Continue usual Synthroid  dose    Family Communication: No family present at time of exam Disposition:   Skilled Nursing-Short Term Rehab (<3 Hours/Day)10/06/2024 1044  Objective: Blood pressure (!) 97/45, pulse 76, temperature 98.4 F (36.9 C), resp. rate 16, height 5' 9 (1.753 m), weight 56.1 kg, SpO2 95%.  Intake/Output Summary (Last 24 hours) at 10/10/2024 0955 Last data filed at 10/10/2024 0715 Gross per 24 hour  Intake 980 ml  Output 300 ml  Net 680 ml   Filed Weights   10/04/24 1439 10/09/24 2125  Weight: 65 kg 56.1 kg     Examination: General: No acute respiratory distress Lungs: Clear to auscultation bilaterally without wheezes or crackles Cardiovascular: Regular rate and rhythm without murmur gallop or rub normal S1 and S2 Abdomen: Nontender, nondistended, soft, bowel sounds positive, no rebound, no ascites, no appreciable mass Extremities: No significant edema   CBC: Recent Labs  Lab 10/04/24 1537 10/05/24 0551 10/09/24 0633 10/09/24 1239 10/10/24 0536  WBC 7.1   < > 6.4 15.0* 7.4  NEUTROABS 4.4  --   --   --   --   HGB 11.4*   < > 10.6* 10.9* 10.0*  HCT 36.3   < > 32.6* 33.7* 30.7*  MCV 89.9   < > 87.2 88.5 89.0  PLT 247   < > 193 244 204   < > = values in this interval not displayed.   Basic Metabolic Panel: Recent Labs  Lab 10/07/24 0702 10/08/24 0702 10/09/24 1239 10/10/24 0536  NA 139 139  --  138  K 4.1 4.1  --  4.7  CL 106 106  --  105  CO2 23 23  --  24  GLUCOSE 87 81  --  105*  BUN 24* 22  --  26*  CREATININE 0.82 0.90 0.76 0.99  CALCIUM  9.2 9.0  --  8.8*  MG  --  2.3  --  2.1   GFR: Estimated Creatinine Clearance: 34.1 mL/min (by C-G formula based on SCr of 0.99 mg/dL).   Scheduled Meds:  acetaminophen   500 mg Oral Q6H   heparin   5,000 Units Subcutaneous Q8H   levothyroxine   150 mcg Oral QAC breakfast   pantoprazole   40 mg Oral Daily   PARoxetine   20 mg Oral Daily   rosuvastatin   20 mg Oral Daily   Continuous Infusions:   LOS: 6 days   Reyes IVAR Moores, MD [facility] Office  [phone] Pager - Text Page per Amion  If 7PM-7AM, please contact night-coverage per Amion 10/10/2024, 9:55 AM     "

## 2024-10-10 NOTE — Plan of Care (Signed)
   Problem: Activity: Goal: Risk for activity intolerance will decrease Outcome: Progressing   Problem: Pain Managment: Goal: General experience of comfort will improve and/or be controlled Outcome: Progressing   Problem: Safety: Goal: Ability to remain free from injury will improve Outcome: Progressing

## 2024-10-10 NOTE — TOC Progression Note (Addendum)
 Transition of Care [facility]) - Progression Note    Patient Details  Name: [name] MRN: [id number] Date of Birth: Sep 24, 1933  Transition of Care [facility] CM/SW Contact  Bridget Cordella Simmonds, LCSW Phone Number: 10/10/2024, 4:52 PM  Clinical Narrative:   CSW spoke with pt regarding PT recommendation for SNF.  Pt is in LTC at [facility], does indicate that she plans to return and is agreeable to PT there.  Permission given to speak with daughter [name].  CSW spoke with [facility]: they can receive pt back for STR, need new auth.  CSW spoke with pt daughter [name], who indicated she is also planning on return to [facility].   SNF auth request submitted in [place].   Expected Discharge Plan: Skilled Nursing Facility Barriers to Discharge: Continued Medical Work up, SNF Pending bed offer               Expected Discharge Plan and Services In-house Referral: Clinical Social Work   Post Acute Care Choice: Skilled Nursing Facility Living arrangements for the past 2 months: Skilled Nursing Facility                                       Social Drivers of Health (SDOH) Interventions SDOH Screenings   Food Insecurity: No Food Insecurity (10/04/2024)  Housing: Low Risk (10/04/2024)  Transportation Needs: No Transportation Needs (10/04/2024)  Utilities: Not At Risk (10/04/2024)  Financial Resource Strain: Low Risk (06/05/2024)   Received from [facility]  Physical Activity: Insufficiently Active (05/26/2023)   Received from [facility]  Social Connections: Moderately Isolated (10/05/2024)  Stress: No Stress Concern Present (05/26/2023)   Received from [facility]  Tobacco Use: Medium Risk (10/09/2024)  Health Literacy: Low Risk (12/12/2023)   Received from [facility]    Readmission Risk Interventions    05/26/2022   11:22 AM  Readmission Risk Prevention Plan  Post Dischage Appt Complete  Medication Screening Complete  Transportation Screening Complete

## 2024-10-10 NOTE — Plan of Care (Signed)

## 2024-10-10 NOTE — Anesthesia Postprocedure Evaluation (Signed)
"   Anesthesia Post Note  Patient: FAYOLA MECKES  Procedure(s) Performed: AMPUTATION, ABOVE KNEE (Left: Knee)     Patient location during evaluation: PACU Anesthesia Type: General Level of consciousness: awake and alert Pain management: pain level controlled Vital Signs Assessment: post-procedure vital signs reviewed and stable Respiratory status: spontaneous breathing, nonlabored ventilation, respiratory function stable and patient connected to nasal cannula oxygen Cardiovascular status: blood pressure returned to baseline and stable Postop Assessment: no apparent nausea or vomiting Anesthetic complications: no   There were no known notable events for this encounter.  Last Vitals:  Vitals:   10/10/24 0518 10/10/24 0736  BP: (!) 111/51 (!) 97/45  Pulse: 70 76  Resp: 16 16  Temp: 36.9 C 36.9 C  SpO2: 95% 95%    Last Pain:  Vitals:   10/10/24 0815  TempSrc:   PainSc: 5                  Debby FORBES Like      "

## 2024-10-10 NOTE — NC FL2 (Signed)
 " [place]  MEDICAID FL2 LEVEL OF CARE FORM     IDENTIFICATION  Patient Name: [name] Birthdate: Nov 25, 1932 Sex: female Admission Date (Current Location): 10/04/2024  [place] and [place] Number:  Lloyd [id number] Facility and Address:  The . [facility], [address] [place], [place] [address]      Provider Number: [id number]  Attending Physician Name and Address:  Danton Reyes DASEN, MD  Relative Name and Phone Number:  Shuck,Joyce Daughter  [phone]    Current Level of Care: Hospital Recommended Level of Care: Skilled Nursing Facility Prior Approval Number:    Date Approved/Denied:   PASRR Number: [id number]  Discharge Plan: SNF    Current Diagnoses: Patient Active Problem List   Diagnosis Date Noted   Pacemaker 10/04/2024   GERD (gastroesophageal reflux disease) 10/13/2023   Gangrene of right foot (HCC) 03/18/2023   S/P AKA (above knee amputation) unilateral, right (HCC) 03/18/2023   Peripheral arterial disease 03/02/2023   Osteomyelitis of toe of right foot (HCC) 03/01/2023   Dehydration 03/01/2023   Acute prerenal azotemia 03/01/2023   History of COPD 03/01/2023   Urinary incontinence 03/01/2023   Anemia of chronic disease 03/01/2023   Cellulitis 01/06/2023   Pressure ulcer of sacral region, stage 2 (HCC) 01/06/2023   Intertriginous dermatitis associated with moisture 01/06/2023   S/P TAVR (transcatheter aortic valve replacement) 05/25/2022   Nausea and vomiting 05/18/2022   Severe aortic stenosis    Depression 02/12/2017   Anxiety 02/12/2017   Hypokalemia 02/12/2017   Biliary colic    Acquired hypothyroidism    Hyperlipidemia    KNEE, ARTHRITIS, DEGEN./OSTEO 05/14/2010    Orientation RESPIRATION BLADDER Height & Weight     Self, Time, Situation, Place  Normal Incontinent Weight: 123 lb 10.9 oz (56.1 kg) Height:  5' 9 (175.3 cm)  BEHAVIORAL SYMPTOMS/MOOD NEUROLOGICAL BOWEL NUTRITION STATUS       Continent Diet (see discharge summary)  AMBULATORY STATUS COMMUNICATION OF NEEDS Skin   Total Care Verbally Surgical wounds                       Personal Care Assistance Level of Assistance  Bathing, Feeding, Dressing Bathing Assistance: Limited assistance Feeding assistance: Limited assistance Dressing Assistance: Limited assistance     Functional Limitations Info  Sight, Hearing, Speech Sight Info: Adequate Hearing Info: Adequate Speech Info: Adequate    SPECIAL CARE FACTORS FREQUENCY  PT (By licensed PT), OT (By licensed OT)     PT Frequency: 5x week OT Frequency: 5x week            Contractures Contractures Info: Not present    Additional Factors Info  Code Status, Allergies Code Status Info: full Allergies Info: NKA           Current Medications (10/10/2024):  This is the current hospital active medication list Current Facility-Administered Medications  Medication Dose Route Frequency Provider Last Rate Last Admin   acetaminophen  (TYLENOL ) tablet 650 mg  650 mg Oral Q6H PRN Baglia, Corrina, PA-C   650 mg at 10/10/24 1524   heparin  injection 5,000 Units  5,000 Units Subcutaneous Q8H Baglia, Corrina, PA-C   5,000 Units at 10/10/24 1406   HYDROcodone -acetaminophen  (NORCO) 7.5-325 MG per tablet 1-2 tablet  1-2 tablet Oral Q4H PRN Baglia, Corrina, PA-C       HYDROcodone -acetaminophen  (NORCO/VICODIN) 5-325 MG per tablet 1-2 tablet  1-2 tablet Oral Q4H PRN Baglia, Corrina, PA-C   2 tablet at 10/10/24 1212  ipratropium-albuterol  (DUONEB) 0.5-2.5 (3) MG/3ML nebulizer solution 3 mL  3 mL Nebulization Q6H PRN Baglia, Corrina, PA-C       levothyroxine  (SYNTHROID ) tablet 150 mcg  150 mcg Oral QAC breakfast Baglia, Corrina, PA-C   150 mcg at 10/10/24 0546   morphine  (PF) 2 MG/ML injection 0.5-1 mg  0.5-1 mg Intravenous Q2H PRN Baglia, Corrina, PA-C   1 mg at 10/09/24 1730   ondansetron  (ZOFRAN ) tablet 4 mg  4 mg Oral Q6H PRN Baglia, Corrina, PA-C   4 mg at 10/09/24  1214   Or   ondansetron  (ZOFRAN ) injection 4 mg  4 mg Intravenous Q6H PRN Baglia, Corrina, PA-C       oxybutynin  (DITROPAN -XL) 24 hr tablet 15 mg  15 mg Oral Daily Danton Purchase T, MD   15 mg at 10/10/24 1104   pantoprazole  (PROTONIX ) EC tablet 40 mg  40 mg Oral Daily Baglia, Corrina, PA-C   40 mg at 10/10/24 0815   PARoxetine  (PAXIL ) tablet 20 mg  20 mg Oral Daily Baglia, Corrina, PA-C   20 mg at 10/10/24 0815   polyethylene glycol (MIRALAX  / GLYCOLAX ) packet 17 g  17 g Oral Daily Danton Purchase T, MD   17 g at 10/10/24 1104   potassium chloride  SA (KLOR-CON  M) CR tablet 40-60 mEq  40-60 mEq Oral Daily PRN Baglia, Corrina, PA-C       rosuvastatin  (CRESTOR ) tablet 20 mg  20 mg Oral Daily Baglia, Corrina, PA-C   20 mg at 10/10/24 0815   senna-docusate (Senokot-S) tablet 1 tablet  1 tablet Oral Daily Danton Purchase DASEN, MD   1 tablet at 10/10/24 1104     Discharge Medications: Please see discharge summary for a list of discharge medications.  Relevant Imaging Results:  Relevant Lab Results:   Additional Information SSN: [id number]  Bridget Cordella Simmonds, LCSW     "

## 2024-10-11 DIAGNOSIS — Z89612 Acquired absence of left leg above knee: Secondary | ICD-10-CM

## 2024-10-11 DIAGNOSIS — I739 Peripheral vascular disease, unspecified: Secondary | ICD-10-CM | POA: Diagnosis not present

## 2024-10-11 DIAGNOSIS — Z4889 Encounter for other specified surgical aftercare: Secondary | ICD-10-CM

## 2024-10-11 LAB — COMPREHENSIVE METABOLIC PANEL WITH GFR
ALT: 9 U/L (ref 0–44)
AST: 29 U/L (ref 15–41)
Albumin: 2.9 g/dL — ABNORMAL LOW (ref 3.5–5.0)
Alkaline Phosphatase: 55 U/L (ref 38–126)
Anion gap: 8 (ref 5–15)
BUN: 35 mg/dL — ABNORMAL HIGH (ref 8–23)
CO2: 23 mmol/L (ref 22–32)
Calcium: 7.9 mg/dL — ABNORMAL LOW (ref 8.9–10.3)
Chloride: 105 mmol/L (ref 98–111)
Creatinine, Ser: 1.22 mg/dL — ABNORMAL HIGH (ref 0.44–1.00)
GFR, Estimated: 42 mL/min — ABNORMAL LOW
Glucose, Bld: 89 mg/dL (ref 70–99)
Potassium: 4.8 mmol/L (ref 3.5–5.1)
Sodium: 135 mmol/L (ref 135–145)
Total Bilirubin: <0.2 mg/dL (ref 0.0–1.2)
Total Protein: 5 g/dL — ABNORMAL LOW (ref 6.5–8.1)

## 2024-10-11 LAB — CBC
HCT: 26.3 % — ABNORMAL LOW (ref 36.0–46.0)
Hemoglobin: 8.3 g/dL — ABNORMAL LOW (ref 12.0–15.0)
MCH: 28.3 pg (ref 26.0–34.0)
MCHC: 31.6 g/dL (ref 30.0–36.0)
MCV: 89.8 fL (ref 80.0–100.0)
Platelets: 162 K/uL (ref 150–400)
RBC: 2.93 MIL/uL — ABNORMAL LOW (ref 3.87–5.11)
RDW: 15 % (ref 11.5–15.5)
WBC: 7.8 K/uL (ref 4.0–10.5)
nRBC: 0 % (ref 0.0–0.2)

## 2024-10-11 LAB — LACTIC ACID, PLASMA: Lactic Acid, Venous: 0.6 mmol/L (ref 0.5–1.9)

## 2024-10-11 MED ORDER — SODIUM CHLORIDE 0.9 % IV SOLN: INTRAVENOUS | Status: AC

## 2024-10-11 NOTE — Progress Notes (Signed)
 Mobility Specialist Progress Note:    10/11/24 0942  Mobility  Activity Pivoted/transferred from bed to chair  Level of Assistance Total care (+2)  Assistive Device None  LLE Weight Bearing Per Provider Order NWB  Activity Response Tolerated well  Mobility Referral Yes  Mobility visit 1 Mobility  Mobility Specialist Start Time (ACUTE ONLY) 0931  Mobility Specialist Stop Time (ACUTE ONLY) 0942  Mobility Specialist Time Calculation (min) (ACUTE ONLY) 11 min   Received pt in bed and eager for OOB mobility. Pt required TotalA +2 w/ anterior-posterior transfer. Pt c/o LLE pain, otherwise tolerated well. Left pt in chair with personal belongings and call light within reach. All needs met.   [name] Mobility Specialist  Please contact via [employer] or  Rehab Office [phone]

## 2024-10-11 NOTE — Progress Notes (Signed)
 "  [name]  FMW:[id number] DOB: May 31, 1933 DOA: 10/04/2024 PCP: Orpha Yancey LABOR, MD    Brief Narrative:  89 year old with a history of COPD, aortic stenosis, PAD, and R AKA who presented to the hospital from her SNF 10/04/2024 with contractures, swelling, and redness of the left lower extremity which had progressively worsened over many weeks to months.  CTa of the lower extremity noted severe diffuse narrowing of the left superficial femoral artery with extensive atheromatous disease.  The patient was placed on a heparin  drip and vascular surgery was consulted.  Ultimately she required left AKA 10/09/2024.  She  Goals of Care:   Code Status: Full Code   DVT prophylaxis: heparin  injection 5,000 Units Start: 10/10/24 0600   Interim Hx: Experienced some ongoing hypotension last night, but was entirely asymptomatic.  Creatinine has increased slightly today.  Hemoglobin has decreased, coincident with volume expansion.  The patient herself has no complaints and appears to be comfortable.  Assessment & Plan:  Ischemic ulceration left fifth toe -extensive left lower extremity PAD -status post L AKA 10/09/2024 Postoperative care per Vascular Surgery - patient was nonambulatory prior to this admission - dressing on surgical wound to be removed in 24-48 hours - patient to follow-up in Vascular Surgery office in 4-5 weeks for staple removal (the office will arrange for this appointment)  Mild persistent hypotension Systolic blood pressure presently stable at 94-113 -patient is entirely asymptomatic -this is likely simply due to mild volume depletion -continue to volume expand and follow  Climbing creatinine Creatinine has increased from nadir of 0.82 to peak of 1.22 today -this is likely simply prerenal in setting of simple dehydration -continue to volume expand today and recheck in a.m.  Declining Hgb Suspect this is simply delusional -no evidence of gross blood loss -recheck CBC in a.m.    Constipation Resolved with bowel regimen   COPD Well compensated during his hospital stay with   Hypothyroidism Continue usual Synthroid  dose    Family Communication: No family present at time of exam Disposition:   Skilled Nursing-Short Term Rehab (<3 Hours/Day)10/10/2024 1000  Objective: Blood pressure (!) 99/33, pulse 77, temperature 97.6 F (36.4 C), resp. rate 16, height 5' 9 (1.753 m), weight 56.1 kg, SpO2 93%.  Intake/Output Summary (Last 24 hours) at 10/11/2024 0912 Last data filed at 10/11/2024 0900 Gross per 24 hour  Intake 240 ml  Output 200 ml  Net 40 ml   Filed Weights   10/04/24 1439 10/09/24 2125  Weight: 65 kg 56.1 kg    Examination: General: No acute respiratory distress Lungs: Clear to auscultation bilaterally without wheezes or crackles Cardiovascular: RRR without murmur Abdomen: NT/ND, soft, BS positive Extremities: No significant edema   CBC: Recent Labs  Lab 10/04/24 1537 10/05/24 0551 10/09/24 1239 10/10/24 0536 10/11/24 0154  WBC 7.1   < > 15.0* 7.4 7.8  NEUTROABS 4.4  --   --   --   --   HGB 11.4*   < > 10.9* 10.0* 8.3*  HCT 36.3   < > 33.7* 30.7* 26.3*  MCV 89.9   < > 88.5 89.0 89.8  PLT 247   < > 244 204 162   < > = values in this interval not displayed.   Basic Metabolic Panel: Recent Labs  Lab 10/08/24 0702 10/09/24 1239 10/10/24 0536 10/11/24 0154  NA 139  --  138 135  K 4.1  --  4.7 4.8  CL 106  --  105 105  CO2  23  --  24 23  GLUCOSE 81  --  105* 89  BUN 22  --  26* 35*  CREATININE 0.90 0.76 0.99 1.22*  CALCIUM  9.0  --  8.8* 7.9*  MG 2.3  --  2.1  --    GFR: Estimated Creatinine Clearance: 27.7 mL/min (A) (by C-G formula based on SCr of 1.22 mg/dL (H)).   Scheduled Meds:  heparin   5,000 Units Subcutaneous Q8H   levothyroxine   150 mcg Oral QAC breakfast   oxybutynin   15 mg Oral Daily   pantoprazole   40 mg Oral Daily   PARoxetine   20 mg Oral Daily   polyethylene glycol  17 g Oral Daily   rosuvastatin   20  mg Oral Daily   senna-docusate  1 tablet Oral Daily      LOS: 7 days   Reyes IVAR Moores, MD [facility] Office  [phone] Pager - Text Page per [place]  If 7PM-7AM, please contact night-coverage per Amion 10/11/2024, 9:12 AM     "

## 2024-10-11 NOTE — TOC Progression Note (Signed)
 Transition of Care [facility]) - Progression Note    Patient Details  Name: [name] MRN: [id number] Date of Birth: Jun 27, 1933  Transition of Care [facility]) CM/SW Contact  Bridget Cordella Simmonds, LCSW Phone Number: 10/11/2024, 8:28 AM  Clinical Narrative:   SNF auth request remains pending in [place].     Expected Discharge Plan: Skilled Nursing Facility Barriers to Discharge: Continued Medical Work up, SNF Pending bed offer               Expected Discharge Plan and Services In-house Referral: Clinical Social Work   Post Acute Care Choice: Skilled Nursing Facility Living arrangements for the past 2 months: Skilled Nursing Facility                                       Social Drivers of Health (SDOH) Interventions SDOH Screenings   Food Insecurity: No Food Insecurity (10/04/2024)  Housing: Low Risk (10/04/2024)  Transportation Needs: No Transportation Needs (10/04/2024)  Utilities: Not At Risk (10/04/2024)  Financial Resource Strain: Low Risk (06/05/2024)   Received from [facility]  Physical Activity: Insufficiently Active (05/26/2023)   Received from [facility]  Social Connections: Moderately Isolated (10/05/2024)  Stress: No Stress Concern Present (05/26/2023)   Received from [facility]  Tobacco Use: Medium Risk (10/09/2024)  Health Literacy: Low Risk (12/12/2023)   Received from [facility]    Readmission Risk Interventions    05/26/2022   11:22 AM  Readmission Risk Prevention Plan  Post Dischage Appt Complete  Medication Screening Complete  Transportation Screening Complete

## 2024-10-11 NOTE — Progress Notes (Signed)
" ° ° ° °  Patient Name: [name]           DOB: [birth year]  MRN: [id number]      Admission Date: 10/04/2024  Attending Provider: Danton Reyes DASEN, MD  Primary Diagnosis: Peripheral arterial disease   Level of care: Med-Surg   OVERNIGHT EVENT   Slightly hypotensive, SBP 90's with MAP 50's All other vital stable, afebrile.  Appears to be asymptomatic---nursing staff denies reports of dizziness, lightheadedness, chest discomfort, palpitations.  BP was also low during daytime, received 500 cc fluid bolus.   No overt bleeding reported.  Patient had left AKA on 1/13.  Prior hemoglobin stable.  Will recheck CBC. Patient has been receiving Norco for pain.  Pain medication could be contributing to low BP. No urine output so far on this shift.  Patient is currently on room fluid maintenance.  Will add an additional 500 cc bolus and badder scan.   Plan: 500 cc fluid bolus Bladder scan CBC CMP Lactic acid   Addendum:    Lavanda Horns, DNP, ACNPC- AG [facility] [place]    "

## 2024-10-11 NOTE — Plan of Care (Signed)

## 2024-10-11 NOTE — Progress Notes (Addendum)
" °  Progress Note    10/11/2024 6:42 AM 2 Days Post-Op  Subjective:  says she doesn't really have any pain and this amputation has been better than the other amputation  Tm 99.4   Vitals:   10/11/24 0209 10/11/24 0539  BP: (!) 102/50 (!) 113/46  Pulse:  70  Resp:  18  Temp:  99.4 F (37.4 C)  SpO2:  95%    Physical Exam: Incisions:  dressing in place and clean and dry   CBC    Component Value Date/Time   WBC 7.8 10/11/2024 0154   RBC 2.93 (L) 10/11/2024 0154   HGB 8.3 (L) 10/11/2024 0154   HGB 13.4 06/23/2020 1500   HCT 26.3 (L) 10/11/2024 0154   HCT 41.7 06/23/2020 1500   PLT 162 10/11/2024 0154   PLT 282 06/23/2020 1500   MCV 89.8 10/11/2024 0154   MCV 86 06/23/2020 1500   MCH 28.3 10/11/2024 0154   MCHC 31.6 10/11/2024 0154   RDW 15.0 10/11/2024 0154   RDW 13.4 06/23/2020 1500   LYMPHSABS 1.9 10/04/2024 1537   MONOABS 0.6 10/04/2024 1537   EOSABS 0.1 10/04/2024 1537   BASOSABS 0.1 10/04/2024 1537    BMET    Component Value Date/Time   NA 135 10/11/2024 0154   NA 144 06/30/2022 1650   K 4.8 10/11/2024 0154   CL 105 10/11/2024 0154   CO2 23 10/11/2024 0154   GLUCOSE 89 10/11/2024 0154   BUN 35 (H) 10/11/2024 0154   BUN 17 06/30/2022 1650   CREATININE 1.22 (H) 10/11/2024 0154   CALCIUM  7.9 (L) 10/11/2024 0154   GFRNONAA 42 (L) 10/11/2024 0154   GFRAA 87 06/23/2020 1500    INR    Component Value Date/Time   INR 0.9 10/04/2024 1537     Intake/Output Summary (Last 24 hours) at 10/11/2024 9357 Last data filed at 10/11/2024 0600 Gross per 24 hour  Intake 360 ml  Output 200 ml  Net 160 ml     Assessment/Plan:  89 y.o. female is s/p left above knee amputation   2 Days Post-Op   -pain is well controlled.  -dressing down in the next day or two -f/u in 4-5 weeks for staple removal and our office will arrange appt.     Lucie Apt, PA-C [facility] [phone] 10/11/2024 6:42 AM           "

## 2024-10-12 DIAGNOSIS — Z4889 Encounter for other specified surgical aftercare: Secondary | ICD-10-CM

## 2024-10-12 DIAGNOSIS — Z89612 Acquired absence of left leg above knee: Secondary | ICD-10-CM

## 2024-10-12 DIAGNOSIS — I739 Peripheral vascular disease, unspecified: Secondary | ICD-10-CM | POA: Diagnosis not present

## 2024-10-12 LAB — BASIC METABOLIC PANEL WITH GFR
Anion gap: 6 (ref 5–15)
BUN: 28 mg/dL — ABNORMAL HIGH (ref 8–23)
CO2: 23 mmol/L (ref 22–32)
Calcium: 8.1 mg/dL — ABNORMAL LOW (ref 8.9–10.3)
Chloride: 105 mmol/L (ref 98–111)
Creatinine, Ser: 0.89 mg/dL (ref 0.44–1.00)
GFR, Estimated: >60 mL/min
Glucose, Bld: 86 mg/dL (ref 70–99)
Potassium: 4.5 mmol/L (ref 3.5–5.1)
Sodium: 134 mmol/L — ABNORMAL LOW (ref 135–145)

## 2024-10-12 LAB — CBC
HCT: 25.3 % — ABNORMAL LOW (ref 36.0–46.0)
Hemoglobin: 8 g/dL — ABNORMAL LOW (ref 12.0–15.0)
MCH: 28.6 pg (ref 26.0–34.0)
MCHC: 31.6 g/dL (ref 30.0–36.0)
MCV: 90.4 fL (ref 80.0–100.0)
Platelets: 162 K/uL (ref 150–400)
RBC: 2.8 MIL/uL — ABNORMAL LOW (ref 3.87–5.11)
RDW: 15 % (ref 11.5–15.5)
WBC: 6.1 K/uL (ref 4.0–10.5)
nRBC: 0 % (ref 0.0–0.2)

## 2024-10-12 MED ORDER — HYDROCODONE-ACETAMINOPHEN 5-325 MG PO TABS: 1.0000 | ORAL_TABLET | ORAL | 0 refills | Status: AC | PRN

## 2024-10-12 NOTE — TOC Transition Note (Signed)
 Transition of Care [facility]) - Discharge Note   Patient Details  Name: [name] MRN: [id number] Date of Birth: May 16, 1933  Transition [facility] Care [facility]) CM/SW Contact:  Bridget Cordella Simmonds, LCSW Phone Number: 10/12/2024, 11:57 AM   Clinical Narrative:   Pt discharging to [facility]. RN call report to [phone].   PTAR called [phone].   Final next level of care: Skilled Nursing Facility Barriers to Discharge: Barriers Resolved   Patient Goals and CMS Choice Patient states their goals for this hospitalization and ongoing recovery are:: be able to move   Choice offered to / list presented to : Patient, Adult Children (pt and daughter planning on return to [facility])      Discharge Placement              Patient chooses bed at:  [facility]) Patient to be transferred to facility by: PTAR Name of family member notified: daughter [name] Patient and family notified of of transfer: 10/12/24  Discharge Plan and Services Additional resources added to the After Visit Summary for   In-house Referral: Clinical Social Work   Post Acute Care Choice: Skilled Nursing Facility                               Social Drivers of Health (SDOH) Interventions SDOH Screenings   Food Insecurity: No Food Insecurity (10/04/2024)  Housing: Low Risk (10/04/2024)  Transportation Needs: No Transportation Needs (10/04/2024)  Utilities: Not At Risk (10/04/2024)  Financial Resource Strain: Low Risk (06/05/2024)   Received [facility]  Physical Activity: Insufficiently Active (05/26/2023)   Received from [facility]  Social Connections: Moderately Isolated (10/05/2024)  Stress: No Stress Concern Present (05/26/2023)   [facility] from [facility]  Tobacco Use: Medium Risk (10/09/2024)  Health Literacy: Low Risk (12/12/2023)   Received from [facility]     Readmission Risk Interventions    05/26/2022   11:22 AM  Readmission Risk Prevention Plan  Post Dischage Appt  Complete  Medication Screening Complete  Transportation Screening Complete

## 2024-10-12 NOTE — TOC Progression Note (Addendum)
 Transition of Care [facility]) - Progression Note    Patient Details  Name: [name] MRN: [id number] Date of Birth: [birth year]  Transition of Care [facility]) CM/SW Contact  Bridget Cordella Simmonds, LCSW Phone Number: 10/12/2024, 8:30 AM  Clinical Narrative:   SNF auth offering P2P: [phone] #5 by 12 noon today. MD notified.  Message from Destiny: CSW updated her on P2P, since she is LTC resident, they can receive pt back today with auth pending.  MD notified, need DC summary by noon.   Expected Discharge Plan: Skilled Nursing Facility Barriers to Discharge: Continued Medical Work up, SNF Pending bed offer               Expected Discharge Plan and Services In-house Referral: Clinical Social Work   Post Acute Care Choice: Skilled Nursing Facility Living arrangements for the past 2 months: Skilled Nursing Facility                                       Social Drivers of Health (SDOH) Interventions SDOH Screenings   Food Insecurity: No Food Insecurity (10/04/2024)  Housing: Low Risk (10/04/2024)  Transportation Needs: No Transportation Needs (10/04/2024)  Utilities: Not At Risk (10/04/2024)  Financial Resource Strain: Low Risk (06/05/2024)   Received from [facility]  Physical Activity: Insufficiently Active (05/26/2023)   Received from [facility]  Social Connections: Moderately Isolated (10/05/2024)  Stress: No Stress Concern Present (05/26/2023)   Received from [facility]  Tobacco Use: Medium Risk (10/09/2024)  Health Literacy: Low Risk (12/12/2023)   Received from [facility]    Readmission Risk Interventions    05/26/2022   11:22 AM  Readmission Risk Prevention Plan  Post Dischage Appt Complete  Medication Screening Complete  Transportation Screening Complete

## 2024-10-12 NOTE — Progress Notes (Signed)
" °  Progress Note    10/12/2024 7:07 AM 3 Days Post-Op  Subjective:  denies any pain.  Afebrile  Vitals:   10/11/24 2021 10/12/24 0327  BP: (!) 103/44 (!) 111/46  Pulse: 64 62  Resp: 16 18  Temp: (!) 97.4 F (36.3 C) 98.6 F (37 C)  SpO2: 92% 92%    Physical Exam: Incisions:  dressing in tact and clean   CBC    Component Value Date/Time   WBC 6.1 10/12/2024 0501   RBC 2.80 (L) 10/12/2024 0501   HGB 8.0 (L) 10/12/2024 0501   HGB 13.4 06/23/2020 1500   HCT 25.3 (L) 10/12/2024 0501   HCT 41.7 06/23/2020 1500   PLT 162 10/12/2024 0501   PLT 282 06/23/2020 1500   MCV 90.4 10/12/2024 0501   MCV 86 06/23/2020 1500   MCH 28.6 10/12/2024 0501   MCHC 31.6 10/12/2024 0501   RDW 15.0 10/12/2024 0501   RDW 13.4 06/23/2020 1500   LYMPHSABS 1.9 10/04/2024 1537   MONOABS 0.6 10/04/2024 1537   EOSABS 0.1 10/04/2024 1537   BASOSABS 0.1 10/04/2024 1537    BMET    Component Value Date/Time   NA 134 (L) 10/12/2024 0501   NA 144 06/30/2022 1650   K 4.5 10/12/2024 0501   CL 105 10/12/2024 0501   CO2 23 10/12/2024 0501   GLUCOSE 86 10/12/2024 0501   BUN 28 (H) 10/12/2024 0501   BUN 17 06/30/2022 1650   CREATININE 0.89 10/12/2024 0501   CALCIUM  8.1 (L) 10/12/2024 0501   GFRNONAA >60 10/12/2024 0501   GFRAA 87 06/23/2020 1500    INR    Component Value Date/Time   INR 0.9 10/04/2024 1537     Intake/Output Summary (Last 24 hours) at 10/12/2024 0707 Last data filed at 10/12/2024 0600 Gross per 24 hour  Intake 395.83 ml  Output 800 ml  Net -404.17 ml     Assessment/Plan:  89 y.o. female is s/p left above knee amputation   3 Days Post-Op   -dressing in tact and pain controlled. -Dr. Lanis to determine when dressing can be removed.   Lucie Apt, PA-C [facility] [phone] 10/12/2024 7:07 AM           "

## 2024-10-12 NOTE — Discharge Summary (Signed)
 "  DISCHARGE SUMMARY  [name]  MR#: [id number]  DOB:1932-11-22  Date of Admission: 10/04/2024 Date of Discharge: 10/12/2024  Attending Physician:Lavera Vandermeer ONEIDA Moores, MD  Patient's ERE:[name], [name] LABOR, MD  Disposition: Discharge to SNF  Follow-up Appts:  Contact information for after-discharge care     Destination     [facility] .   Service: Skilled Nursing Contact information: [address] [place]  [address] [phone]                     Tests Needing Follow-up: -Encouraged consistent oral intake  Discharge Diagnoses: Ischemic ulceration left fifth toe Extensive left lower extremity PAD Status post L AKA 10/09/2024 Mild persistent hypotension - resolved  Constipation COPD S/P TAVR for AoS 2023 Hypothyroidism  Initial presentation: 89 year old with a history of COPD, aortic stenosis, PAD, and R AKA who presented to the hospital from her SNF 10/04/2024 with contractures, swelling, and redness of the left lower extremity which had progressively worsened over many weeks to months. CTa of the lower extremity noted severe diffuse narrowing of the left superficial femoral artery with extensive atheromatous disease. The patient was placed on a heparin  drip and Vascular Surgery was consulted. Ultimately she required left AKA 10/09/2024.   Hospital Course:  Ischemic ulceration left fifth toe -extensive left lower extremity PAD - status post L AKA 10/09/2024 Postoperative care per Vascular Surgery - patient was nonambulatory prior to this admission - dressing on surgical wound dry/intact - patient to follow-up in Vascular Surgery office in 4-5 weeks for staple removal (the office will arrange for this appointment)   Mild persistent hypotension - resolved Systolic blood pressure transiently decreased below 100 consistently - patient was entirely asymptomatic -this appeared to be due to mild volume depletion -blood pressure  improved with volume expansion -encouraged ongoing oral intake   Transient climbing creatinine - resolved Creatinine increased from nadir of 0.82 to peak of 1.22 1/15 - this was likely simply prerenal in setting of simple dehydration -creatinine improved with volume expansion and has normalized at time of discharge   Declining Hgb - resolved Suspect this is simply dilutional - no evidence of gross blood loss -hemoglobin stable at time of discharge   Constipation Resolved with bowel regimen   COPD Well compensated during his hospital stay with   Hypothyroidism Continue usual Synthroid  dose  Allergies as of 10/12/2024   No Known Allergies      Medication List     STOP taking these medications    empagliflozin 10 MG Tabs tablet Commonly known as: JARDIANCE   furosemide  80 MG tablet Commonly known as: LASIX    metoprolol  succinate 25 MG 24 hr tablet Commonly known as: TOPROL -XL   spironolactone 25 MG tablet Commonly known as: ALDACTONE   valsartan 40 MG tablet Commonly known as: DIOVAN       TAKE these medications    acetaminophen  650 MG CR tablet Commonly known as: TYLENOL  Take 650 mg by mouth in the morning, at noon, and at bedtime.   aspirin  EC 81 MG tablet Take 81 mg by mouth daily. Swallow whole.   HYDROcodone -acetaminophen  5-325 MG tablet Commonly known as: NORCO/VICODIN Take 1-2 tablets by mouth every 4 (four) hours as needed for moderate pain (pain score 4-6).   levothyroxine  150 MCG tablet Commonly known as: SYNTHROID  Take 150 mcg by mouth daily before breakfast.   nitroGLYCERIN  0.4 MG SL tablet Commonly known as: NITROSTAT  Place 1 tablet (0.4 mg total) under  the tongue every 5 (five) minutes as needed for chest pain.   omeprazole 20 MG capsule Commonly known as: PRILOSEC Take 20 mg by mouth daily.   ondansetron  4 MG tablet Commonly known as: ZOFRAN  Take 4 mg by mouth every 6 (six) hours as needed for nausea or vomiting.   oxybutynin  15 MG  24 hr tablet Commonly known as: DITROPAN  XL Take 15 mg by mouth daily.   PARoxetine  20 MG tablet Commonly known as: PAXIL  Take 20 mg by mouth daily.   polyethylene glycol 17 g packet Commonly known as: MIRALAX  / GLYCOLAX  Take 17 g by mouth at bedtime.   senna-docusate 8.6-50 MG tablet Commonly known as: Senokot-S Take 1 tablet by mouth at bedtime.        Day of Discharge BP (!) 103/52 (BP Location: Left Arm)   Pulse 62   Temp 98 F (36.7 C)   Resp 16   Ht 5' 9 (1.753 m)   Wt 56.1 kg   SpO2 95%   BMI 18.26 kg/m   Physical Exam: General: No acute respiratory distress Lungs: Clear to auscultation bilaterally  Cardiovascular: Regular rate and rhythm  Abdomen: NT/ND, soft, BS+  Extremities: No significant edema  Basic Metabolic Panel: Recent Labs  Lab 10/07/24 0702 10/08/24 0702 10/09/24 1239 10/10/24 0536 10/11/24 0154 10/12/24 0501  NA 139 139  --  138 135 134*  K 4.1 4.1  --  4.7 4.8 4.5  CL 106 106  --  105 105 105  CO2 23 23  --  24 23 23   GLUCOSE 87 81  --  105* 89 86  BUN 24* 22  --  26* 35* 28*  CREATININE 0.82 0.90 0.76 0.99 1.22* 0.89  CALCIUM  9.2 9.0  --  8.8* 7.9* 8.1*  MG  --  2.3  --  2.1  --   --     CBC: Recent Labs  Lab 10/09/24 0633 10/09/24 1239 10/10/24 0536 10/11/24 0154 10/12/24 0501  WBC 6.4 15.0* 7.4 7.8 6.1  HGB 10.6* 10.9* 10.0* 8.3* 8.0*  HCT 32.6* 33.7* 30.7* 26.3* 25.3*  MCV 87.2 88.5 89.0 89.8 90.4  PLT 193 244 204 162 162     Time spent in discharge (includes decision making & examination of pt): 35 minutes  10/12/2024, 9:35 AM   Reyes IVAR Moores, MD [facility] Office  [phone]      "

## 2024-10-12 NOTE — Plan of Care (Signed)
   Problem: Education: Goal: Knowledge of General Education information will improve Description Including pain rating scale, medication(s)/side effects and non-pharmacologic comfort measures Outcome: Progressing   Problem: Health Behavior/Discharge Planning: Goal: Ability to manage health-related needs will improve Outcome: Progressing

## 2024-10-12 NOTE — Plan of Care (Signed)
 " Problem: Education: Goal: Knowledge of General Education information will improve Description: Including pain rating scale, medication(s)/side effects and non-pharmacologic comfort measures 10/12/2024 1001 by Delores Kirsch, RN Outcome: Adequate for Discharge 10/12/2024 1000 by Delores Kirsch, RN Outcome: Progressing   Problem: Health Behavior/Discharge Planning: Goal: Ability to manage health-related needs will improve 10/12/2024 1001 by Delores Kirsch, RN Outcome: Adequate for Discharge 10/12/2024 1000 by Delores Kirsch, RN Outcome: Progressing   Problem: Clinical Measurements: Goal: Ability to maintain clinical measurements within normal limits will improve Outcome: Adequate for Discharge Goal: Will remain free from infection Outcome: Adequate for Discharge Goal: Diagnostic test results will improve Outcome: Adequate for Discharge Goal: Respiratory complications will improve Outcome: Adequate for Discharge Goal: Cardiovascular complication will be avoided Outcome: Adequate for Discharge   Problem: Activity: Goal: Risk for activity intolerance will decrease Outcome: Adequate for Discharge   Problem: Nutrition: Goal: Adequate nutrition will be maintained Outcome: Adequate for Discharge   Problem: Coping: Goal: Level of anxiety will decrease Outcome: Adequate for Discharge   Problem: Elimination: Goal: Will not experience complications related to bowel motility Outcome: Adequate for Discharge Goal: Will not experience complications related to urinary retention Outcome: Adequate for Discharge   Problem: Pain Managment: Goal: General experience of comfort will improve and/or be controlled Outcome: Adequate for Discharge   Problem: Safety: Goal: Ability to remain free from injury will improve Outcome: Adequate for Discharge   Problem: Skin Integrity: Goal: Risk for impaired skin integrity will decrease Outcome: Adequate for Discharge   Problem: Education: Goal:  Knowledge of the prescribed therapeutic regimen will improve Outcome: Adequate for Discharge   Problem: Bowel/Gastric: Goal: Gastrointestinal status for postoperative course will improve Outcome: Adequate for Discharge   Problem: Cardiac: Goal: Ability to maintain an adequate cardiac output Outcome: Adequate for Discharge Goal: Will show no evidence of cardiac arrhythmias Outcome: Adequate for Discharge   Problem: Nutritional: Goal: Will attain and maintain optimal nutritional status Outcome: Adequate for Discharge   Problem: Neurological: Goal: Will regain or maintain usual level of consciousness Outcome: Adequate for Discharge   Problem: Clinical Measurements: Goal: Ability to maintain clinical measurements within normal limits Outcome: Adequate for Discharge Goal: Postoperative complications will be avoided or minimized Outcome: Adequate for Discharge   Problem: Respiratory: Goal: Will regain and/or maintain adequate ventilation Outcome: Adequate for Discharge Goal: Respiratory status will improve Outcome: Adequate for Discharge   Problem: Skin Integrity: Goal: Demonstrates signs of wound healing without infection Outcome: Adequate for Discharge   Problem: Urinary Elimination: Goal: Will remain free from infection Outcome: Adequate for Discharge Goal: Ability to achieve and maintain adequate urine output Outcome: Adequate for Discharge   Problem: Education: Goal: Knowledge of the prescribed therapeutic regimen will improve Outcome: Adequate for Discharge Goal: Ability to verbalize activity precautions or restrictions will improve Outcome: Adequate for Discharge Goal: Understanding of discharge needs will improve Outcome: Adequate for Discharge   Problem: Activity: Goal: Ability to perform//tolerate increased activity and mobilize with assistive devices will improve Outcome: Adequate for Discharge   Problem: Clinical Measurements: Goal: Postoperative  complications will be avoided or minimized Outcome: Adequate for Discharge   Problem: Self-Care: Goal: Ability to meet self-care needs will improve Outcome: Adequate for Discharge   Problem: Self-Concept: Goal: Ability to maintain and perform role responsibilities to the fullest extent possible will improve Outcome: Adequate for Discharge   Problem: Pain Management: Goal: Pain level will decrease with appropriate interventions Outcome: Adequate for Discharge   Problem: Skin Integrity: Goal: Demonstration of wound healing without infection  will improve Outcome: Adequate for Discharge   "

## 2024-11-22 ENCOUNTER — Encounter
